# Patient Record
Sex: Male | Born: 1970 | Race: Black or African American | Hispanic: No | Marital: Married | Smoking: Former smoker
Health system: Southern US, Community
[De-identification: ages and names within clinical notes are randomized; demographics above are authoritative.]

## PROBLEM LIST (undated history)

## (undated) DIAGNOSIS — D649 Anemia, unspecified: Secondary | ICD-10-CM

## (undated) DIAGNOSIS — R059 Cough, unspecified: Secondary | ICD-10-CM

## (undated) DIAGNOSIS — E079 Disorder of thyroid, unspecified: Secondary | ICD-10-CM

## (undated) DIAGNOSIS — Z992 Dependence on renal dialysis: Secondary | ICD-10-CM

## (undated) DIAGNOSIS — I10 Essential (primary) hypertension: Secondary | ICD-10-CM

## (undated) DIAGNOSIS — M62838 Other muscle spasm: Secondary | ICD-10-CM

## (undated) DIAGNOSIS — R51 Headache: Secondary | ICD-10-CM

## (undated) DIAGNOSIS — N186 End stage renal disease: Secondary | ICD-10-CM

## (undated) DIAGNOSIS — F329 Major depressive disorder, single episode, unspecified: Secondary | ICD-10-CM

## (undated) DIAGNOSIS — F419 Anxiety disorder, unspecified: Secondary | ICD-10-CM

## (undated) DIAGNOSIS — F32A Depression, unspecified: Secondary | ICD-10-CM

## (undated) DIAGNOSIS — J189 Pneumonia, unspecified organism: Secondary | ICD-10-CM

## (undated) DIAGNOSIS — R05 Cough: Secondary | ICD-10-CM

## (undated) HISTORY — DX: Anemia, unspecified: D64.9

## (undated) HISTORY — PX: ARTERIOVENOUS GRAFT PLACEMENT: SUR1029

## (undated) HISTORY — DX: Disorder of thyroid, unspecified: E07.9

---

## 2002-12-05 ENCOUNTER — Emergency Department (HOSPITAL_COMMUNITY): Admission: EM | Admit: 2002-12-05 | Discharge: 2002-12-05 | Payer: Self-pay | Admitting: Emergency Medicine

## 2002-12-11 ENCOUNTER — Emergency Department (HOSPITAL_COMMUNITY): Admission: EM | Admit: 2002-12-11 | Discharge: 2002-12-11 | Payer: Self-pay | Admitting: Emergency Medicine

## 2003-02-24 ENCOUNTER — Encounter: Payer: Self-pay | Admitting: Specialist

## 2003-02-24 ENCOUNTER — Encounter: Admission: RE | Admit: 2003-02-24 | Discharge: 2003-02-24 | Payer: Self-pay | Admitting: Specialist

## 2003-03-12 ENCOUNTER — Encounter: Payer: Self-pay | Admitting: Internal Medicine

## 2003-03-12 ENCOUNTER — Ambulatory Visit (HOSPITAL_COMMUNITY): Admission: RE | Admit: 2003-03-12 | Discharge: 2003-03-12 | Payer: Self-pay | Admitting: Internal Medicine

## 2003-05-18 ENCOUNTER — Emergency Department (HOSPITAL_COMMUNITY): Admission: EM | Admit: 2003-05-18 | Discharge: 2003-05-19 | Payer: Self-pay | Admitting: *Deleted

## 2004-12-01 ENCOUNTER — Inpatient Hospital Stay (HOSPITAL_COMMUNITY): Admission: EM | Admit: 2004-12-01 | Discharge: 2004-12-04 | Payer: Self-pay | Admitting: Emergency Medicine

## 2004-12-15 ENCOUNTER — Ambulatory Visit: Payer: Self-pay | Admitting: Internal Medicine

## 2004-12-16 ENCOUNTER — Ambulatory Visit: Payer: Self-pay | Admitting: *Deleted

## 2005-10-20 ENCOUNTER — Emergency Department (HOSPITAL_COMMUNITY): Admission: EM | Admit: 2005-10-20 | Discharge: 2005-10-20 | Payer: Self-pay | Admitting: Emergency Medicine

## 2005-10-30 ENCOUNTER — Inpatient Hospital Stay (HOSPITAL_COMMUNITY): Admission: EM | Admit: 2005-10-30 | Discharge: 2005-11-03 | Payer: Self-pay | Admitting: Emergency Medicine

## 2005-12-19 ENCOUNTER — Ambulatory Visit: Payer: Self-pay | Admitting: Internal Medicine

## 2005-12-21 ENCOUNTER — Ambulatory Visit (HOSPITAL_COMMUNITY): Admission: RE | Admit: 2005-12-21 | Discharge: 2005-12-21 | Payer: Self-pay | Admitting: Vascular Surgery

## 2006-03-01 ENCOUNTER — Emergency Department (HOSPITAL_COMMUNITY): Admission: EM | Admit: 2006-03-01 | Discharge: 2006-03-01 | Payer: Self-pay | Admitting: Emergency Medicine

## 2006-04-09 ENCOUNTER — Ambulatory Visit: Payer: Self-pay | Admitting: Internal Medicine

## 2006-04-09 ENCOUNTER — Inpatient Hospital Stay (HOSPITAL_COMMUNITY): Admission: EM | Admit: 2006-04-09 | Discharge: 2006-04-11 | Payer: Self-pay | Admitting: Emergency Medicine

## 2006-04-10 ENCOUNTER — Encounter (INDEPENDENT_AMBULATORY_CARE_PROVIDER_SITE_OTHER): Payer: Self-pay | Admitting: Interventional Cardiology

## 2006-04-20 ENCOUNTER — Ambulatory Visit: Payer: Self-pay | Admitting: Internal Medicine

## 2006-04-21 ENCOUNTER — Ambulatory Visit: Payer: Self-pay | Admitting: Internal Medicine

## 2006-04-25 ENCOUNTER — Ambulatory Visit: Payer: Self-pay | Admitting: Internal Medicine

## 2006-05-24 ENCOUNTER — Ambulatory Visit: Payer: Self-pay | Admitting: Internal Medicine

## 2006-08-10 ENCOUNTER — Ambulatory Visit (HOSPITAL_COMMUNITY): Admission: RE | Admit: 2006-08-10 | Discharge: 2006-08-10 | Payer: Self-pay | Admitting: Vascular Surgery

## 2006-09-17 ENCOUNTER — Ambulatory Visit (HOSPITAL_COMMUNITY): Admission: RE | Admit: 2006-09-17 | Discharge: 2006-09-17 | Payer: Self-pay | Admitting: Diagnostic Radiology

## 2006-09-18 ENCOUNTER — Ambulatory Visit (HOSPITAL_COMMUNITY): Admission: RE | Admit: 2006-09-18 | Discharge: 2006-09-18 | Payer: Self-pay | Admitting: Nephrology

## 2006-09-19 ENCOUNTER — Ambulatory Visit (HOSPITAL_COMMUNITY): Admission: RE | Admit: 2006-09-19 | Discharge: 2006-09-19 | Payer: Self-pay | Admitting: Vascular Surgery

## 2006-12-01 ENCOUNTER — Emergency Department (HOSPITAL_COMMUNITY): Admission: EM | Admit: 2006-12-01 | Discharge: 2006-12-01 | Payer: Self-pay | Admitting: Emergency Medicine

## 2007-02-02 ENCOUNTER — Encounter: Admission: RE | Admit: 2007-02-02 | Discharge: 2007-02-02 | Payer: Self-pay | Admitting: Nephrology

## 2007-07-01 ENCOUNTER — Emergency Department (HOSPITAL_COMMUNITY): Admission: EM | Admit: 2007-07-01 | Discharge: 2007-07-01 | Payer: Self-pay | Admitting: Emergency Medicine

## 2007-09-16 ENCOUNTER — Emergency Department (HOSPITAL_COMMUNITY): Admission: EM | Admit: 2007-09-16 | Discharge: 2007-09-17 | Payer: Self-pay | Admitting: Emergency Medicine

## 2007-10-01 ENCOUNTER — Ambulatory Visit: Payer: Self-pay | Admitting: Vascular Surgery

## 2007-10-01 ENCOUNTER — Ambulatory Visit (HOSPITAL_COMMUNITY): Admission: RE | Admit: 2007-10-01 | Discharge: 2007-10-01 | Payer: Self-pay | Admitting: Vascular Surgery

## 2008-03-15 ENCOUNTER — Emergency Department (HOSPITAL_COMMUNITY): Admission: EM | Admit: 2008-03-15 | Discharge: 2008-03-15 | Payer: Self-pay | Admitting: Emergency Medicine

## 2008-11-30 ENCOUNTER — Emergency Department (HOSPITAL_COMMUNITY): Admission: EM | Admit: 2008-11-30 | Discharge: 2008-12-01 | Payer: Self-pay | Admitting: Emergency Medicine

## 2008-12-19 ENCOUNTER — Emergency Department (HOSPITAL_COMMUNITY): Admission: EM | Admit: 2008-12-19 | Discharge: 2008-12-20 | Payer: Self-pay | Admitting: Emergency Medicine

## 2008-12-21 ENCOUNTER — Emergency Department (HOSPITAL_COMMUNITY): Admission: EM | Admit: 2008-12-21 | Discharge: 2008-12-21 | Payer: Self-pay | Admitting: Emergency Medicine

## 2009-01-11 ENCOUNTER — Emergency Department (HOSPITAL_COMMUNITY): Admission: EM | Admit: 2009-01-11 | Discharge: 2009-01-11 | Payer: Self-pay | Admitting: Emergency Medicine

## 2009-01-16 ENCOUNTER — Ambulatory Visit (HOSPITAL_COMMUNITY): Admission: RE | Admit: 2009-01-16 | Discharge: 2009-01-16 | Payer: Self-pay | Admitting: *Deleted

## 2009-01-18 ENCOUNTER — Ambulatory Visit: Payer: Self-pay | Admitting: Vascular Surgery

## 2009-01-18 ENCOUNTER — Ambulatory Visit (HOSPITAL_COMMUNITY): Admission: EM | Admit: 2009-01-18 | Discharge: 2009-01-18 | Payer: Self-pay | Admitting: Emergency Medicine

## 2009-02-07 ENCOUNTER — Observation Stay (HOSPITAL_COMMUNITY): Admission: EM | Admit: 2009-02-07 | Discharge: 2009-02-08 | Payer: Self-pay | Admitting: Emergency Medicine

## 2009-07-31 ENCOUNTER — Inpatient Hospital Stay (HOSPITAL_COMMUNITY): Admission: EM | Admit: 2009-07-31 | Discharge: 2009-08-02 | Payer: Self-pay | Admitting: Emergency Medicine

## 2009-08-04 ENCOUNTER — Ambulatory Visit: Payer: Self-pay | Admitting: Vascular Surgery

## 2009-08-04 ENCOUNTER — Ambulatory Visit (HOSPITAL_COMMUNITY): Admission: RE | Admit: 2009-08-04 | Discharge: 2009-08-04 | Payer: Self-pay | Admitting: Vascular Surgery

## 2009-08-23 ENCOUNTER — Emergency Department (HOSPITAL_COMMUNITY): Admission: EM | Admit: 2009-08-23 | Discharge: 2009-08-24 | Payer: Self-pay | Admitting: Emergency Medicine

## 2009-09-02 ENCOUNTER — Ambulatory Visit: Payer: Self-pay | Admitting: Vascular Surgery

## 2009-09-09 ENCOUNTER — Ambulatory Visit (HOSPITAL_COMMUNITY): Admission: RE | Admit: 2009-09-09 | Discharge: 2009-09-09 | Payer: Self-pay | Admitting: Vascular Surgery

## 2009-09-11 ENCOUNTER — Ambulatory Visit (HOSPITAL_COMMUNITY): Admission: RE | Admit: 2009-09-11 | Discharge: 2009-09-11 | Payer: Self-pay | Admitting: Vascular Surgery

## 2009-10-12 ENCOUNTER — Ambulatory Visit (HOSPITAL_COMMUNITY): Admission: RE | Admit: 2009-10-12 | Discharge: 2009-10-12 | Payer: Self-pay | Admitting: Nephrology

## 2009-10-19 ENCOUNTER — Ambulatory Visit (HOSPITAL_COMMUNITY): Admission: RE | Admit: 2009-10-19 | Discharge: 2009-10-19 | Payer: Self-pay | Admitting: Vascular Surgery

## 2009-10-19 ENCOUNTER — Ambulatory Visit: Payer: Self-pay | Admitting: Vascular Surgery

## 2009-11-11 ENCOUNTER — Ambulatory Visit: Payer: Self-pay | Admitting: Vascular Surgery

## 2009-11-30 ENCOUNTER — Ambulatory Visit (HOSPITAL_COMMUNITY): Admission: RE | Admit: 2009-11-30 | Discharge: 2009-11-30 | Payer: Self-pay | Admitting: Nephrology

## 2010-02-11 ENCOUNTER — Ambulatory Visit (HOSPITAL_COMMUNITY): Admission: RE | Admit: 2010-02-11 | Discharge: 2010-02-11 | Payer: Self-pay | Admitting: Nephrology

## 2010-02-11 ENCOUNTER — Inpatient Hospital Stay (HOSPITAL_COMMUNITY): Admission: EM | Admit: 2010-02-11 | Discharge: 2010-02-13 | Payer: Self-pay | Admitting: Emergency Medicine

## 2010-02-14 ENCOUNTER — Inpatient Hospital Stay (HOSPITAL_COMMUNITY): Admission: EM | Admit: 2010-02-14 | Discharge: 2010-02-22 | Payer: Self-pay | Admitting: Nephrology

## 2010-02-18 ENCOUNTER — Encounter (INDEPENDENT_AMBULATORY_CARE_PROVIDER_SITE_OTHER): Payer: Self-pay | Admitting: Nephrology

## 2010-02-18 ENCOUNTER — Ambulatory Visit: Payer: Self-pay | Admitting: Vascular Surgery

## 2010-03-17 ENCOUNTER — Ambulatory Visit (HOSPITAL_COMMUNITY): Admission: RE | Admit: 2010-03-17 | Discharge: 2010-03-17 | Payer: Self-pay | Admitting: Nephrology

## 2010-04-02 ENCOUNTER — Ambulatory Visit (HOSPITAL_COMMUNITY): Admission: RE | Admit: 2010-04-02 | Discharge: 2010-04-02 | Payer: Self-pay | Admitting: Nephrology

## 2010-04-26 ENCOUNTER — Ambulatory Visit (HOSPITAL_COMMUNITY): Admission: RE | Admit: 2010-04-26 | Discharge: 2010-04-26 | Payer: Self-pay | Admitting: Nephrology

## 2010-05-05 ENCOUNTER — Encounter: Admission: RE | Admit: 2010-05-05 | Discharge: 2010-05-05 | Payer: Self-pay | Admitting: Nephrology

## 2010-05-06 ENCOUNTER — Encounter: Admission: RE | Admit: 2010-05-06 | Discharge: 2010-05-06 | Payer: Self-pay | Admitting: Nephrology

## 2010-06-23 ENCOUNTER — Ambulatory Visit: Payer: Self-pay | Admitting: Cardiology

## 2010-06-23 ENCOUNTER — Ambulatory Visit (HOSPITAL_COMMUNITY): Admission: RE | Admit: 2010-06-23 | Discharge: 2010-06-23 | Payer: Self-pay | Admitting: Nephrology

## 2010-06-25 ENCOUNTER — Encounter: Admission: RE | Admit: 2010-06-25 | Discharge: 2010-06-25 | Payer: Self-pay | Admitting: Nephrology

## 2010-08-06 ENCOUNTER — Ambulatory Visit (HOSPITAL_COMMUNITY): Admission: RE | Admit: 2010-08-06 | Discharge: 2010-08-06 | Payer: Self-pay | Admitting: Nephrology

## 2010-10-24 ENCOUNTER — Encounter: Payer: Self-pay | Admitting: Neurosurgery

## 2010-10-24 ENCOUNTER — Encounter: Payer: Self-pay | Admitting: Nephrology

## 2010-12-14 LAB — POTASSIUM: Potassium: 4.9 mEq/L (ref 3.5–5.1)

## 2010-12-19 LAB — POCT I-STAT 4, (NA,K, GLUC, HGB,HCT)
Potassium: 4.1 mEq/L (ref 3.5–5.1)
Sodium: 132 mEq/L — ABNORMAL LOW (ref 135–145)

## 2010-12-20 LAB — CBC
HCT: 25.8 % — ABNORMAL LOW (ref 39.0–52.0)
HCT: 28.6 % — ABNORMAL LOW (ref 39.0–52.0)
HCT: 29.8 % — ABNORMAL LOW (ref 39.0–52.0)
Hemoglobin: 10 g/dL — ABNORMAL LOW (ref 13.0–17.0)
Hemoglobin: 8.8 g/dL — ABNORMAL LOW (ref 13.0–17.0)
Hemoglobin: 9.8 g/dL — ABNORMAL LOW (ref 13.0–17.0)
MCHC: 33.5 g/dL (ref 30.0–36.0)
MCHC: 33.6 g/dL (ref 30.0–36.0)
MCHC: 33.6 g/dL (ref 30.0–36.0)
MCHC: 33.9 g/dL (ref 30.0–36.0)
MCHC: 34.1 g/dL (ref 30.0–36.0)
MCHC: 34.2 g/dL (ref 30.0–36.0)
MCHC: 34.2 g/dL (ref 30.0–36.0)
MCV: 90.9 fL (ref 78.0–100.0)
MCV: 91.2 fL (ref 78.0–100.0)
MCV: 91.2 fL (ref 78.0–100.0)
MCV: 91.4 fL (ref 78.0–100.0)
MCV: 91.8 fL (ref 78.0–100.0)
MCV: 91.9 fL (ref 78.0–100.0)
MCV: 92 fL (ref 78.0–100.0)
Platelets: 173 10*3/uL (ref 150–400)
Platelets: 174 10*3/uL (ref 150–400)
Platelets: 188 10*3/uL (ref 150–400)
Platelets: 194 K/uL (ref 150–400)
Platelets: 197 10*3/uL (ref 150–400)
Platelets: 212 10*3/uL (ref 150–400)
RBC: 2.87 MIL/uL — ABNORMAL LOW (ref 4.22–5.81)
RBC: 2.93 MIL/uL — ABNORMAL LOW (ref 4.22–5.81)
RBC: 3.11 MIL/uL — ABNORMAL LOW (ref 4.22–5.81)
RBC: 3.25 MIL/uL — ABNORMAL LOW (ref 4.22–5.81)
RDW: 14.2 % (ref 11.5–15.5)
RDW: 14.4 % (ref 11.5–15.5)
RDW: 14.9 % (ref 11.5–15.5)
RDW: 15.1 % (ref 11.5–15.5)
WBC: 5 10*3/uL (ref 4.0–10.5)
WBC: 8.2 10*3/uL (ref 4.0–10.5)
WBC: 9.4 10*3/uL (ref 4.0–10.5)
WBC: 9.4 K/uL (ref 4.0–10.5)

## 2010-12-20 LAB — RENAL FUNCTION PANEL
Albumin: 2.5 g/dL — ABNORMAL LOW (ref 3.5–5.2)
Albumin: 2.5 g/dL — ABNORMAL LOW (ref 3.5–5.2)
Albumin: 2.5 g/dL — ABNORMAL LOW (ref 3.5–5.2)
Albumin: 2.7 g/dL — ABNORMAL LOW (ref 3.5–5.2)
Albumin: 2.8 g/dL — ABNORMAL LOW (ref 3.5–5.2)
BUN: 34 mg/dL — ABNORMAL HIGH (ref 6–23)
BUN: 39 mg/dL — ABNORMAL HIGH (ref 6–23)
BUN: 69 mg/dL — ABNORMAL HIGH (ref 6–23)
BUN: 90 mg/dL — ABNORMAL HIGH (ref 6–23)
CO2: 28 mEq/L (ref 19–32)
CO2: 29 meq/L (ref 19–32)
Calcium: 10 mg/dL (ref 8.4–10.5)
Calcium: 9.5 mg/dL (ref 8.4–10.5)
Calcium: 9.8 mg/dL (ref 8.4–10.5)
Calcium: 9.9 mg/dL (ref 8.4–10.5)
Calcium: 9.9 mg/dL (ref 8.4–10.5)
Chloride: 94 mEq/L — ABNORMAL LOW (ref 96–112)
Chloride: 94 meq/L — ABNORMAL LOW (ref 96–112)
Creatinine, Ser: 11.39 mg/dL — ABNORMAL HIGH (ref 0.4–1.5)
Creatinine, Ser: 11.45 mg/dL — ABNORMAL HIGH (ref 0.4–1.5)
Creatinine, Ser: 14.72 mg/dL — ABNORMAL HIGH (ref 0.4–1.5)
Creatinine, Ser: 15.31 mg/dL — ABNORMAL HIGH (ref 0.4–1.5)
Creatinine, Ser: 16.06 mg/dL — ABNORMAL HIGH (ref 0.4–1.5)
GFR calc Af Amer: 4 mL/min — ABNORMAL LOW (ref >60)
GFR calc Af Amer: 5 mL/min — ABNORMAL LOW (ref >60)
GFR calc Af Amer: 6 mL/min — ABNORMAL LOW (ref >60)
GFR calc Af Amer: 6 mL/min — ABNORMAL LOW (ref >60)
GFR calc non Af Amer: 3 mL/min — ABNORMAL LOW (ref >60)
GFR calc non Af Amer: 4 mL/min — ABNORMAL LOW (ref >60)
GFR calc non Af Amer: 5 mL/min — ABNORMAL LOW (ref >60)
GFR calc non Af Amer: 5 mL/min — ABNORMAL LOW (ref >60)
Glucose, Bld: 101 mg/dL — ABNORMAL HIGH (ref 70–99)
Glucose, Bld: 104 mg/dL — ABNORMAL HIGH (ref 70–99)
Glucose, Bld: 91 mg/dL (ref 70–99)
Glucose, Bld: 92 mg/dL (ref 70–99)
Phosphorus: 4.8 mg/dL — ABNORMAL HIGH (ref 2.3–4.6)
Phosphorus: 5 mg/dL — ABNORMAL HIGH (ref 2.3–4.6)
Phosphorus: 5.3 mg/dL — ABNORMAL HIGH (ref 2.3–4.6)
Phosphorus: 6.1 mg/dL — ABNORMAL HIGH (ref 2.3–4.6)
Phosphorus: 6.5 mg/dL — ABNORMAL HIGH (ref 2.3–4.6)
Potassium: 3.8 mEq/L (ref 3.5–5.1)
Potassium: 4.6 mEq/L (ref 3.5–5.1)
Potassium: 5 meq/L (ref 3.5–5.1)
Sodium: 133 mEq/L — ABNORMAL LOW (ref 135–145)
Sodium: 133 meq/L — ABNORMAL LOW (ref 135–145)
Sodium: 135 mEq/L (ref 135–145)

## 2010-12-20 LAB — PTH, INTACT AND CALCIUM
Calcium, Total (PTH): 8.7 mg/dL (ref 8.4–10.5)
PTH: 174.7 pg/mL — ABNORMAL HIGH (ref 14.0–72.0)

## 2010-12-20 LAB — TYPE AND SCREEN: ABO/RH(D): O POS

## 2010-12-20 LAB — IRON AND TIBC
Iron: 45 ug/dL (ref 42–135)
TIBC: 165 ug/dL — ABNORMAL LOW (ref 215–435)

## 2010-12-20 LAB — VANCOMYCIN, RANDOM: Vancomycin Rm: 50.5 ug/mL

## 2010-12-20 LAB — BASIC METABOLIC PANEL
BUN: 50 mg/dL — ABNORMAL HIGH (ref 6–23)
Creatinine, Ser: 14.46 mg/dL — ABNORMAL HIGH (ref 0.4–1.5)
GFR calc non Af Amer: 4 mL/min — ABNORMAL LOW (ref >60)

## 2010-12-21 LAB — CBC
HCT: 31.2 % — ABNORMAL LOW (ref 39.0–52.0)
Hemoglobin: 10.6 g/dL — ABNORMAL LOW (ref 13.0–17.0)
Hemoglobin: 9.6 g/dL — ABNORMAL LOW (ref 13.0–17.0)
MCHC: 34.1 g/dL (ref 30.0–36.0)
MCHC: 34.1 g/dL (ref 30.0–36.0)
MCV: 90.6 fL (ref 78.0–100.0)
MCV: 92.3 fL (ref 78.0–100.0)
Platelets: 190 10*3/uL (ref 150–400)
Platelets: 197 10*3/uL (ref 150–400)
Platelets: 252 10*3/uL (ref 150–400)
RBC: 3.08 MIL/uL — ABNORMAL LOW (ref 4.22–5.81)
RBC: 3.75 MIL/uL — ABNORMAL LOW (ref 4.22–5.81)
RDW: 14.8 % (ref 11.5–15.5)
RDW: 14.9 % (ref 11.5–15.5)
WBC: 7.5 10*3/uL (ref 4.0–10.5)

## 2010-12-21 LAB — BASIC METABOLIC PANEL
BUN: 33 mg/dL — ABNORMAL HIGH (ref 6–23)
CO2: 24 mEq/L (ref 19–32)
Calcium: 10.1 mg/dL (ref 8.4–10.5)
Chloride: 98 mEq/L (ref 96–112)
Chloride: 99 mEq/L (ref 96–112)
Creatinine, Ser: 10.65 mg/dL — ABNORMAL HIGH (ref 0.4–1.5)
GFR calc Af Amer: 4 mL/min — ABNORMAL LOW (ref >60)
GFR calc Af Amer: 7 mL/min — ABNORMAL LOW (ref >60)
GFR calc non Af Amer: 5 mL/min — ABNORMAL LOW (ref >60)
Sodium: 135 mEq/L (ref 135–145)

## 2010-12-21 LAB — CULTURE, BLOOD (ROUTINE X 2): Culture: NO GROWTH

## 2010-12-21 LAB — RENAL FUNCTION PANEL
BUN: 47 mg/dL — ABNORMAL HIGH (ref 6–23)
CO2: 23 mEq/L (ref 19–32)
Calcium: 10.1 mg/dL (ref 8.4–10.5)
Calcium: 10.2 mg/dL (ref 8.4–10.5)
GFR calc Af Amer: 5 mL/min — ABNORMAL LOW (ref >60)
GFR calc non Af Amer: 4 mL/min — ABNORMAL LOW (ref >60)
Glucose, Bld: 109 mg/dL — ABNORMAL HIGH (ref 70–99)
Phosphorus: 4.7 mg/dL — ABNORMAL HIGH (ref 2.3–4.6)
Potassium: 4.6 mEq/L (ref 3.5–5.1)
Sodium: 137 mEq/L (ref 135–145)
Sodium: 137 mEq/L (ref 135–145)

## 2010-12-21 LAB — DIFFERENTIAL
Basophils Absolute: 0 10*3/uL (ref 0.0–0.1)
Eosinophils Absolute: 0 10*3/uL (ref 0.0–0.7)
Eosinophils Absolute: 0 10*3/uL (ref 0.0–0.7)
Eosinophils Relative: 0 % (ref 0–5)
Lymphocytes Relative: 5 % — ABNORMAL LOW (ref 12–46)
Lymphocytes Relative: 6 % — ABNORMAL LOW (ref 12–46)
Lymphs Abs: 0.5 10*3/uL — ABNORMAL LOW (ref 0.7–4.0)
Monocytes Absolute: 0.8 10*3/uL (ref 0.1–1.0)
Monocytes Relative: 1 % — ABNORMAL LOW (ref 3–12)
Neutro Abs: 7 10*3/uL (ref 1.7–7.7)
Neutrophils Relative %: 93 % — ABNORMAL HIGH (ref 43–77)

## 2010-12-21 LAB — GLUCOSE, CAPILLARY
Glucose-Capillary: 117 mg/dL — ABNORMAL HIGH (ref 70–99)
Glucose-Capillary: 76 mg/dL (ref 70–99)

## 2010-12-21 LAB — LACTIC ACID, PLASMA: Lactic Acid, Venous: 2.2 mmol/L (ref 0.5–2.2)

## 2011-01-05 LAB — POCT CARDIAC MARKERS
CKMB, poc: <1 ng/mL — ABNORMAL LOW (ref 1.0–8.0)
CKMB, poc: <1 ng/mL — ABNORMAL LOW (ref 1.0–8.0)
Myoglobin, poc: >500 ng/mL (ref 12–200)
Myoglobin, poc: >500 ng/mL (ref 12–200)

## 2011-01-05 LAB — CBC
MCHC: 33.9 g/dL (ref 30.0–36.0)
MCV: 94.5 fL (ref 78.0–100.0)
Platelets: 229 10*3/uL (ref 150–400)
RDW: 15.1 % (ref 11.5–15.5)
WBC: 4.5 10*3/uL (ref 4.0–10.5)

## 2011-01-05 LAB — DIFFERENTIAL
Basophils Absolute: 0 10*3/uL (ref 0.0–0.1)
Basophils Relative: 1 % (ref 0–1)
Eosinophils Absolute: 0.1 10*3/uL (ref 0.0–0.7)
Neutrophils Relative %: 52 % (ref 43–77)

## 2011-01-05 LAB — POCT I-STAT, CHEM 8
HCT: 39 % (ref 39.0–52.0)
Hemoglobin: 13.3 g/dL (ref 13.0–17.0)
Potassium: 4.8 mEq/L (ref 3.5–5.1)
Sodium: 134 mEq/L — ABNORMAL LOW (ref 135–145)
TCO2: 23 mmol/L (ref 0–100)

## 2011-01-05 LAB — POCT I-STAT 4, (NA,K, GLUC, HGB,HCT)
Potassium: 4 mEq/L (ref 3.5–5.1)
Sodium: 133 mEq/L — ABNORMAL LOW (ref 135–145)

## 2011-01-06 LAB — CBC
HCT: 40.2 % (ref 39.0–52.0)
HCT: 42 % (ref 39.0–52.0)
HCT: 43.6 % (ref 39.0–52.0)
Hemoglobin: 13.6 g/dL (ref 13.0–17.0)
Hemoglobin: 14.1 g/dL (ref 13.0–17.0)
Hemoglobin: 14.6 g/dL (ref 13.0–17.0)
MCHC: 33.7 g/dL (ref 30.0–36.0)
MCV: 95.3 fL (ref 78.0–100.0)
Platelets: 168 10*3/uL (ref 150–400)
Platelets: 177 10*3/uL (ref 150–400)
Platelets: 194 10*3/uL (ref 150–400)
RBC: 4.41 MIL/uL (ref 4.22–5.81)
RDW: 15.2 % (ref 11.5–15.5)
RDW: 15.4 % (ref 11.5–15.5)
WBC: 10.4 10*3/uL (ref 4.0–10.5)
WBC: 17.2 10*3/uL — ABNORMAL HIGH (ref 4.0–10.5)

## 2011-01-06 LAB — URINE CULTURE
Colony Count: NO GROWTH
Culture: NO GROWTH

## 2011-01-06 LAB — COMPREHENSIVE METABOLIC PANEL
ALT: <8 U/L (ref 0–53)
Albumin: 4 g/dL (ref 3.5–5.2)
Alkaline Phosphatase: 74 U/L (ref 39–117)
BUN: 39 mg/dL — ABNORMAL HIGH (ref 6–23)
Chloride: 95 mEq/L — ABNORMAL LOW (ref 96–112)
Potassium: 4.8 mEq/L (ref 3.5–5.1)
Sodium: 133 mEq/L — ABNORMAL LOW (ref 135–145)
Total Bilirubin: 0.6 mg/dL (ref 0.3–1.2)
Total Protein: 8.8 g/dL — ABNORMAL HIGH (ref 6.0–8.3)

## 2011-01-06 LAB — CULTURE, BLOOD (ROUTINE X 2)

## 2011-01-06 LAB — BASIC METABOLIC PANEL
BUN: 28 mg/dL — ABNORMAL HIGH (ref 6–23)
BUN: 50 mg/dL — ABNORMAL HIGH (ref 6–23)
CO2: 22 mEq/L (ref 19–32)
CO2: 26 mEq/L (ref 19–32)
Calcium: 8.9 mg/dL (ref 8.4–10.5)
Calcium: 9.8 mg/dL (ref 8.4–10.5)
Chloride: 92 mEq/L — ABNORMAL LOW (ref 96–112)
Chloride: 94 mEq/L — ABNORMAL LOW (ref 96–112)
Creatinine, Ser: 17.85 mg/dL — ABNORMAL HIGH (ref 0.4–1.5)
GFR calc Af Amer: 4 mL/min — ABNORMAL LOW (ref >60)
GFR calc Af Amer: 4 mL/min — ABNORMAL LOW (ref >60)
GFR calc non Af Amer: 3 mL/min — ABNORMAL LOW (ref >60)
Glucose, Bld: 101 mg/dL — ABNORMAL HIGH (ref 70–99)
Glucose, Bld: 93 mg/dL (ref 70–99)
Potassium: 4.5 mEq/L (ref 3.5–5.1)
Potassium: 5.9 mEq/L — ABNORMAL HIGH (ref 3.5–5.1)
Sodium: 130 mEq/L — ABNORMAL LOW (ref 135–145)
Sodium: 136 mEq/L (ref 135–145)

## 2011-01-06 LAB — URINE MICROSCOPIC-ADD ON

## 2011-01-06 LAB — URINALYSIS, ROUTINE W REFLEX MICROSCOPIC
Glucose, UA: NEGATIVE mg/dL
Ketones, ur: 15 mg/dL — AB
Nitrite: NEGATIVE
Specific Gravity, Urine: 1.018 (ref 1.005–1.030)
pH: 5.5 (ref 5.0–8.0)

## 2011-01-06 LAB — DIFFERENTIAL
Basophils Absolute: 0 10*3/uL (ref 0.0–0.1)
Basophils Relative: 0 % (ref 0–1)
Eosinophils Absolute: 0 10*3/uL (ref 0.0–0.7)
Eosinophils Relative: 0 % (ref 0–5)
Monocytes Absolute: 0.4 10*3/uL (ref 0.1–1.0)
Monocytes Relative: 4 % (ref 3–12)
Neutro Abs: 9.6 10*3/uL — ABNORMAL HIGH (ref 1.7–7.7)

## 2011-01-11 LAB — DIFFERENTIAL
Basophils Relative: 1 % (ref 0–1)
Eosinophils Absolute: 0.1 10*3/uL (ref 0.0–0.7)
Eosinophils Relative: 1 % (ref 0–5)
Monocytes Absolute: 0.6 10*3/uL (ref 0.1–1.0)
Monocytes Relative: 12 % (ref 3–12)

## 2011-01-11 LAB — POCT I-STAT, CHEM 8
BUN: 59 mg/dL — ABNORMAL HIGH (ref 6–23)
Chloride: 97 mEq/L (ref 96–112)
Creatinine, Ser: 9 mg/dL — ABNORMAL HIGH (ref 0.4–1.5)
Glucose, Bld: 90 mg/dL (ref 70–99)
HCT: 43 % (ref 39.0–52.0)
Potassium: 5.5 mEq/L — ABNORMAL HIGH (ref 3.5–5.1)

## 2011-01-11 LAB — CBC
Hemoglobin: 13.4 g/dL (ref 13.0–17.0)
MCHC: 33.8 g/dL (ref 30.0–36.0)
MCV: 95.5 fL (ref 78.0–100.0)
RBC: 4.14 MIL/uL — ABNORMAL LOW (ref 4.22–5.81)
RDW: 16 % — ABNORMAL HIGH (ref 11.5–15.5)

## 2011-01-11 LAB — CARDIAC PANEL(CRET KIN+CKTOT+MB+TROPI)
CK, MB: 1.1 ng/mL (ref 0.3–4.0)
Total CK: 209 U/L (ref 7–232)
Troponin I: 0.02 ng/mL (ref 0.00–0.06)

## 2011-01-11 LAB — TROPONIN I: Troponin I: 0.02 ng/mL (ref 0.00–0.06)

## 2011-01-11 LAB — CK TOTAL AND CKMB (NOT AT ARMC): Relative Index: 0.4 (ref 0.0–2.5)

## 2011-01-12 LAB — CBC
MCHC: 33 g/dL (ref 30.0–36.0)
MCV: 97.6 fL (ref 78.0–100.0)
Platelets: 180 10*3/uL (ref 150–400)
WBC: 7.6 10*3/uL (ref 4.0–10.5)

## 2011-01-12 LAB — POCT I-STAT, CHEM 8
Calcium, Ion: 1.26 mmol/L (ref 1.12–1.32)
Chloride: 107 mEq/L (ref 96–112)
Glucose, Bld: 80 mg/dL (ref 70–99)
HCT: 38 % — ABNORMAL LOW (ref 39.0–52.0)
HCT: 43 % (ref 39.0–52.0)
Hemoglobin: 14.6 g/dL (ref 13.0–17.0)
Potassium: 4 mEq/L (ref 3.5–5.1)
Sodium: 138 mEq/L (ref 135–145)
TCO2: 26 mmol/L (ref 0–100)

## 2011-01-12 LAB — BASIC METABOLIC PANEL
BUN: 30 mg/dL — ABNORMAL HIGH (ref 6–23)
Chloride: 94 mEq/L — ABNORMAL LOW (ref 96–112)
Creatinine, Ser: 9.57 mg/dL — ABNORMAL HIGH (ref 0.4–1.5)

## 2011-01-12 LAB — DIFFERENTIAL
Basophils Relative: 1 % (ref 0–1)
Eosinophils Absolute: 0 10*3/uL (ref 0.0–0.7)
Neutrophils Relative %: 80 % — ABNORMAL HIGH (ref 43–77)

## 2011-01-12 LAB — POCT CARDIAC MARKERS
CKMB, poc: <1 ng/mL — ABNORMAL LOW (ref 1.0–8.0)
CKMB, poc: <1 ng/mL — ABNORMAL LOW (ref 1.0–8.0)
Myoglobin, poc: 416 ng/mL (ref 12–200)
Myoglobin, poc: 440 ng/mL (ref 12–200)

## 2011-01-13 LAB — DIFFERENTIAL
Basophils Relative: 0 % (ref 0–1)
Lymphs Abs: 1 10*3/uL (ref 0.7–4.0)
Monocytes Relative: 8 % (ref 3–12)
Neutro Abs: 6.3 10*3/uL (ref 1.7–7.7)
Neutrophils Relative %: 78 % — ABNORMAL HIGH (ref 43–77)

## 2011-01-13 LAB — CULTURE, BLOOD (ROUTINE X 2)

## 2011-01-13 LAB — POCT I-STAT, CHEM 8
Calcium, Ion: 1.09 mmol/L — ABNORMAL LOW (ref 1.12–1.32)
Creatinine, Ser: 12.5 mg/dL — ABNORMAL HIGH (ref 0.4–1.5)
Glucose, Bld: 85 mg/dL (ref 70–99)
HCT: 39 % (ref 39.0–52.0)
Hemoglobin: 13.3 g/dL (ref 13.0–17.0)
Potassium: 4.8 mEq/L (ref 3.5–5.1)
TCO2: 28 mmol/L (ref 0–100)

## 2011-01-13 LAB — CBC
MCHC: 33.4 g/dL (ref 30.0–36.0)
RBC: 3.88 MIL/uL — ABNORMAL LOW (ref 4.22–5.81)
WBC: 8.1 10*3/uL (ref 4.0–10.5)

## 2011-01-24 ENCOUNTER — Other Ambulatory Visit (HOSPITAL_COMMUNITY): Payer: Self-pay | Admitting: Nephrology

## 2011-01-24 DIAGNOSIS — N186 End stage renal disease: Secondary | ICD-10-CM

## 2011-01-25 ENCOUNTER — Ambulatory Visit (HOSPITAL_COMMUNITY): Payer: Medicare Other

## 2011-01-25 ENCOUNTER — Ambulatory Visit (HOSPITAL_COMMUNITY)
Admission: RE | Admit: 2011-01-25 | Discharge: 2011-01-25 | Disposition: A | Discharge status: Home / Self Care | Payer: Medicare Other | Source: Ambulatory Visit | Attending: Nephrology | Admitting: Nephrology

## 2011-01-25 ENCOUNTER — Other Ambulatory Visit (HOSPITAL_COMMUNITY): Payer: Self-pay | Admitting: Nephrology

## 2011-01-25 DIAGNOSIS — Y832 Surgical operation with anastomosis, bypass or graft as the cause of abnormal reaction of the patient, or of later complication, without mention of misadventure at the time of the procedure: Secondary | ICD-10-CM | POA: Insufficient documentation

## 2011-01-25 DIAGNOSIS — T82898A Other specified complication of vascular prosthetic devices, implants and grafts, initial encounter: Secondary | ICD-10-CM | POA: Insufficient documentation

## 2011-01-25 DIAGNOSIS — I120 Hypertensive chronic kidney disease with stage 5 chronic kidney disease or end stage renal disease: Secondary | ICD-10-CM | POA: Insufficient documentation

## 2011-01-25 DIAGNOSIS — D509 Iron deficiency anemia, unspecified: Secondary | ICD-10-CM | POA: Insufficient documentation

## 2011-01-25 DIAGNOSIS — N186 End stage renal disease: Secondary | ICD-10-CM

## 2011-01-25 DIAGNOSIS — N2581 Secondary hyperparathyroidism of renal origin: Secondary | ICD-10-CM | POA: Insufficient documentation

## 2011-01-25 MED ORDER — IOHEXOL 300 MG/ML  SOLN
100.0000 mL | Freq: Once | INTRAMUSCULAR | Status: AC | PRN
  Administered 2011-01-25: 50 mL via INTRAVENOUS

## 2011-02-05 ENCOUNTER — Emergency Department (HOSPITAL_COMMUNITY): Payer: Medicare Other

## 2011-02-05 ENCOUNTER — Emergency Department (HOSPITAL_COMMUNITY)
Admission: EM | Admit: 2011-02-05 | Discharge: 2011-02-05 | Disposition: A | Discharge status: Home / Self Care | Payer: Medicare Other | Attending: Emergency Medicine | Admitting: Emergency Medicine

## 2011-02-05 DIAGNOSIS — Z79899 Other long term (current) drug therapy: Secondary | ICD-10-CM | POA: Insufficient documentation

## 2011-02-05 DIAGNOSIS — M25439 Effusion, unspecified wrist: Secondary | ICD-10-CM | POA: Insufficient documentation

## 2011-02-05 DIAGNOSIS — Z992 Dependence on renal dialysis: Secondary | ICD-10-CM | POA: Insufficient documentation

## 2011-02-05 DIAGNOSIS — I129 Hypertensive chronic kidney disease with stage 1 through stage 4 chronic kidney disease, or unspecified chronic kidney disease: Secondary | ICD-10-CM | POA: Insufficient documentation

## 2011-02-05 DIAGNOSIS — N189 Chronic kidney disease, unspecified: Secondary | ICD-10-CM | POA: Insufficient documentation

## 2011-02-05 DIAGNOSIS — M25539 Pain in unspecified wrist: Secondary | ICD-10-CM | POA: Insufficient documentation

## 2011-02-05 LAB — DIFFERENTIAL
Basophils Absolute: 0 10*3/uL (ref 0.0–0.1)
Basophils Relative: 1 % (ref 0–1)
Eosinophils Absolute: 0 10*3/uL (ref 0.0–0.7)
Eosinophils Relative: 1 % (ref 0–5)
Monocytes Absolute: 0.9 10*3/uL (ref 0.1–1.0)
Monocytes Relative: 12 % (ref 3–12)

## 2011-02-05 LAB — CBC
MCH: 31.4 pg (ref 26.0–34.0)
MCHC: 33.3 g/dL (ref 30.0–36.0)
RDW: 13.5 % (ref 11.5–15.5)

## 2011-02-05 LAB — BASIC METABOLIC PANEL
CO2: 27 mEq/L (ref 19–32)
Calcium: 10.3 mg/dL (ref 8.4–10.5)
Creatinine, Ser: 9.33 mg/dL — ABNORMAL HIGH (ref 0.4–1.5)
GFR calc Af Amer: 8 mL/min — ABNORMAL LOW (ref >60)
GFR calc non Af Amer: 6 mL/min — ABNORMAL LOW (ref >60)
Sodium: 136 mEq/L (ref 135–145)

## 2011-02-08 ENCOUNTER — Emergency Department (HOSPITAL_COMMUNITY): Payer: Medicare Other

## 2011-02-08 ENCOUNTER — Emergency Department (HOSPITAL_COMMUNITY)
Admission: EM | Admit: 2011-02-08 | Discharge: 2011-02-08 | Disposition: A | Discharge status: Home / Self Care | Payer: Medicare Other | Attending: Emergency Medicine | Admitting: Emergency Medicine

## 2011-02-08 DIAGNOSIS — M25439 Effusion, unspecified wrist: Secondary | ICD-10-CM | POA: Insufficient documentation

## 2011-02-08 DIAGNOSIS — I129 Hypertensive chronic kidney disease with stage 1 through stage 4 chronic kidney disease, or unspecified chronic kidney disease: Secondary | ICD-10-CM | POA: Insufficient documentation

## 2011-02-08 DIAGNOSIS — Z7901 Long term (current) use of anticoagulants: Secondary | ICD-10-CM | POA: Insufficient documentation

## 2011-02-08 DIAGNOSIS — M25539 Pain in unspecified wrist: Secondary | ICD-10-CM | POA: Insufficient documentation

## 2011-02-08 DIAGNOSIS — N189 Chronic kidney disease, unspecified: Secondary | ICD-10-CM | POA: Insufficient documentation

## 2011-02-08 DIAGNOSIS — D649 Anemia, unspecified: Secondary | ICD-10-CM | POA: Insufficient documentation

## 2011-02-08 DIAGNOSIS — Z992 Dependence on renal dialysis: Secondary | ICD-10-CM | POA: Insufficient documentation

## 2011-02-08 DIAGNOSIS — M25639 Stiffness of unspecified wrist, not elsewhere classified: Secondary | ICD-10-CM | POA: Insufficient documentation

## 2011-02-08 DIAGNOSIS — Z79899 Other long term (current) drug therapy: Secondary | ICD-10-CM | POA: Insufficient documentation

## 2011-02-15 NOTE — Assessment & Plan Note (Signed)
OFFICE VISIT   NIKALUS, LASKE  DOB:  23-Dec-1970                                       09/02/2009  [id number]   CHIEF COMPLAINT:  Needs new hemodialysis access.   HISTORY OF PRESENT ILLNESS:  The patient is a 40 year old male with end-  stage renal disease.  He has previously had a left forearm AV graft  which has occluded and has been revised multiple times.  He was recently  admitted to the hospital with a staph line sepsis.  He is right-handed.  He currently dialyzes on Tuesday, Thursday and Saturday.   Other chronic medical problems include hypertension, anemia of chronic  disease, in addition to his end-stage renal disease.  These are all  stable in character.  He also has a history of hepatitis B antigen.   PAST SURGICAL HISTORY:  Otherwise unremarkable except for his access  procedures.   He currently is dialyzing via right-sided catheter.  He states that ever  since catheter has been placed, he has had some swelling of his neck and  feels like he has some facial swelling as well.   Full review of systems was performed with the patient.  Please see  intake referral form for details regarding this.   FAMILY HISTORY:  No history of renal failure.   SOCIAL HISTORY:  He denies tobacco or alcohol abuse.   Records from his recent discharge summary from the hospital were  reviewed as well as his old medical records from our office regarding  all of his previous access procedures.  His most recent catheter was  placed on August 04, 2009.   PHYSICAL EXAMINATION:  Blood pressure is 121/86 in the right arm, heart  rate 73 and regular.  He has a right-sided dialysis catheter which has  no evidence of infection.  HEENT:  Unremarkable otherwise.  His neck  veins do not seem distended.  He does not seem to have JVD or cervical  lymphadenopathy.  He has no carotid bruits.  Chest:  Clear to  auscultation.  Cardiac: Exam has regular rate  without murmur.  Abdomen:  Soft, nontender, nondistended.  No masses.  Extremities:  He has 2+  brachial and radial pulses bilaterally.  He has no significant lower  extremity edema.  Skin has no rashes.  Neurologic:  Exam shows symmetric  upper extremity and lower extremity motor strength which is 5/5  bilaterally.  He has an occluded forearm graft on the left side.   I believe the best option for this patient would be placement of the  left upper arm AV graft at this point.  However, since he has had some  intermittent problems with facial swelling and neck swelling, I believe  he needs a central venogram prior to this.  We will schedule him for his  central venogram next week and then placement of his left upper arm  graft the following week if his central venogram shows the central veins  on the left side are patent.  Risks, benefits, possible complications  and  procedure details in placement of a left upper arm graft were  discussed with the patient today including, but not limited to,  bleeding, infection, graft thrombosis, ischemic steal.  He understands  and agrees to proceed.   Jessy Oto. Fields, MD  Electronically Signed   CEF/MEDQ  D:  09/03/2009  T:  09/03/2009  Job:  [id number]   cc:   Elzie Rings. Lorrene Reid, M.D.

## 2011-02-15 NOTE — Assessment & Plan Note (Signed)
OFFICE VISIT   [name], [name]  DOB:  [birth year]                                       11/11/2009  [id number]   CHIEF COMPLAINT:  Needs hemodialysis access.   HISTORY OF PRESENT ILLNESS:  The patient is a 40 year old male who has  had multiple failed prior hemodialysis access procedures.  He previously  had a left forearm AV graft in 2007.  He has had multiple thrombectomies  and revisions of this.  He has had multiple prior catheters.  He most  recently had a right Cimino fistula placed with Dr. Donnetta Hutching which occluded  early, in less than a month.   He has also had a central venogram which shows a left innominate vein  occlusion.  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  He currently dialyzes on Tuesday,  Thursday, Saturday.  He is right-handed.   CHRONIC MEDICAL PROBLEMS:  Include hypertension which is currently  controlled and followed by Dr. Lorrene Reid.   FAMILY HISTORY:  Unremarkable.   SOCIAL HISTORY:  He is unemployed.  He is married.  He has 2 children.  He does not smoke or consume alcohol regularly.   REVIEW OF SYSTEMS:  Full 12-point review of systems was performed with  the patient.  Please see intake referral form for details regarding  this.   PHYSICAL EXAM:  Blood pressure is 134/94 in the left arm, heart rate 76  and regular, respirations 16.  HEENT is unremarkable.  Neck has 2+  carotid pulses without bruit.  Upper extremities:  He has 2+ brachial  and radial pulses bilaterally.  He has thrombosed AV grafts in the left  arm.  He has a thrombosed artery to cephalic AV fistula.  Chest:  Clear  to auscultation.  Cardiac exam:  Regular rate abdomen rhythm.  Abdomen:  Soft, nontender, no masses.   I had a lengthy discussion with the patient today, a decision of placing  a thigh graft versus placing a right arm AV graft.  The fistula occluded  early most likely because  the vein was fairly small.  He does have a  patent superior vena cava at least by his most recent central venogram.  I discussed with him that he may develop venous hypertension or early  occlusion of the right arm AV graft due to his superior vena cava  stenosis.  I also discussed with him the possibility of placing a thigh  graft.  He is currently not interested in having a thigh graft and  wishes to try an arm graft first.  We will schedule this for him on a  nondialysis day.  Risks, benefits, possible complications, and procedure  details were discussed with the patient.  I also discussed with him  spending 1 night in the hospital to make sure he does not develop venous  hypertension symptoms or if he occluded early to consider redo  angioplasty of his superior vena cava.     Jessy Oto. Fields, MD  Electronically Signed   CEF/MEDQ  D:  11/16/2009  T:  11/16/2009  Job:  [id number]

## 2011-02-15 NOTE — Op Note (Signed)
[name], ALPERS NO.:  [id number]   MEDICAL RECORD NO.:  [id number]          PATIENT TYPE:  OBV   LOCATION:  2550                         FACILITY:  [facility]   PHYSICIAN:  [name], M.D.    DATE OF BIRTH:  05-01-1971   DATE OF PROCEDURE:  01/18/2009  DATE OF DISCHARGE:  01/18/2009                               OPERATIVE REPORT   PREOPERATIVE DIAGNOSIS:  End-stage renal disease.   POSTOPERATIVE DIAGNOSIS:  End-stage renal disease.   PROCEDURE:  Left internal jugular Diatek catheter placement with  ultrasound visualization.   SURGEON:  [name], MD   ASSISTANT:  Nurse.   ANESTHESIA:  MAC.   COMPLICATIONS:  None.   DISPOSITION:  To recovery room, stable.   PROCEDURE IN DETAIL:  The patient was taken to the operating room,  placed in supine position, where an area of the left and right neck  prepped and draped in the usual sterile fashion.  The patient was placed  in Trendelenburg position.  Using ultrasound guidance, the left internal  jugular vein was accessed and a guidewire was passed down to the level  of the superior vena cava.  The guidewire was not passed down into the  right atrium.  An internal catheter was positioned over this and with  manipulation of the catheter, the guidewire was passed down into the  right atrium.  A dilator and peel-away sheath were passed over the  guidewire, and the dilator and guidewire were removed.  A 32-cm Diatek  catheter was passed down through the peel-away sheath, which was then  removed.  The catheter was then brought to the subcutaneous tunnel.  The  2-lumen ports were attached.  Catheter was positioned at the level of  distal right atrium.  The both lumens were flushed and aspirated easily,  were locked with 1000 units/mL heparin.  The catheter was secured to the  skin with 3-0 nylon stitch and entry site was closed with 4-0  subcuticular Vicryl stitch.  Sterile dressing was applied.  The patient  was taken to the recovery room in stable condition.      [name], M.D.  Electronically Signed     TFE/MEDQ  D:  01/18/2009  T:  01/19/2009  Job:  [id number]

## 2011-02-15 NOTE — Procedures (Signed)
CEPHALIC VEIN MAPPING   INDICATION:  Renal failure.   HISTORY:  Diabetes mellitus.   EXAM:  The right cephalic vein is compressible.   Diameter measurements range from 0.10 to 0.31 cm.   The right basilic vein is compressible.   Diameter measurements range from 0.19 to 0.26 cm.   The left cephalic vein is compressible.   Diameter measurements range from 0.12 to 0.16 cm.   The left basilic vein is compressible with diameter measurements ranging  from 0.20 to 0.11 cm.   See attached worksheet for all measurements.   IMPRESSION:  The patient's bilateral basilic and cephalic veins are  questionable diameter for use as dialysis access graft site.   ___________________________________________  Jessy Oto. Fields, MD   CJ/MEDQ  D:  09/02/2009  T:  09/02/2009  Job:  [id number]

## 2011-02-15 NOTE — Op Note (Signed)
[name], [name]             ACCOUNT NO.:  [id number]   MEDICAL RECORD NO.:  [id number]          PATIENT TYPE:  AMB   LOCATION:  SDS                          FACILITY:  [facility]   PHYSICIAN:  Charles E. Fields, MD  DATE OF BIRTH:  1970/11/10   DATE OF PROCEDURE:  10/01/2007  DATE OF DISCHARGE:                               OPERATIVE REPORT   PROCEDURES:  1. Ultrasound of neck.  2. Insertion of right internal jugular vein Diatek catheter.   PREPROCEDURE DIAGNOSIS:  Endstage renal disease.   POSTOPERATIVE DIAGNOSIS:  Endstage renal disease.   ANESTHESIA:  Local with IV sedation.   ASSISTANT:  Nurse.   OPERATIVE FINDINGS:  1. Patent right internal jugular vein.  2. A 23-cm Diatek catheter, right internal jugular vein.   OPERATIVE DETAILS:  After obtaining informed consent, the patient was  taken to the operating room.  The patient was placed in the supine  position on the operating table.  After adequate sedation, the patient's  entire neck and chest were prepped and draped in the usual sterile  fashion.  Local anesthesia was then infiltrated over the area of the  right internal jugular vein.  Ultrasound was used to identify the right  internal jugular vein and assist in cannulation.  The right internal  jugular vein had normal compressibility and respiratory variation.  Next, an introducer was used to cannulate the right internal jugular  vein, and a 0.035 J-tip guidewire was threaded through the needle into  the right internal jugular vein and down to the inferior vena cava under  fluoroscopic guidance.  Next, sequential 12, 14 and 16 French dilators  with peel-away sheath were placed over the guidewire in the right  atrium.  The guidewire and dilator were removed.  A 23-cm Diatek  catheter was placed through the peel-away sheath in the right atrium.  The peel-away sheath was then removed.  The catheter was then tunneled  subcutaneously, cut to length and the hub attached.   The catheter was  noted to flush and draw easily.  The catheter was sutured to the skin  with nylon sutures.  The neck insertion site was closed with a Vicryl  stitch.  The catheter was inspected under fluoroscopy and found the tip  to be in the right atrium, no kinks throughout its course.  The catheter  was then loaded with concentrated heparin solution and dry sterile  dressing was applied.  The patient tolerated the procedure well and  there were no complications. Instruments, sponge, and needle counts were  correct at the end of the case.  The patient was taken to the recovery  room in stable condition.     Jessy Oto. Fields, MD  Electronically Signed    CEF/MEDQ  D:  10/01/2007  T:  10/01/2007  Job:  [id number]

## 2011-02-18 NOTE — Discharge Summary (Signed)
[name], [name]       ACCOUNT NO.:  [id number]   MEDICAL RECORD NO.:  [id number]          PATIENT TYPE:  INP   LOCATION:  2029                         FACILITY:  [facility]   PHYSICIAN:  Sheila Oats, M.D.DATE OF BIRTH:  1971-03-07   DATE OF ADMISSION:  10/30/2005  DATE OF DISCHARGE:  11/03/2005                                 DISCHARGE SUMMARY   DISCHARGE DIAGNOSES:  1.  Hypertensive emergency.  2.  Renal failure, advancing -- acute on chronic versus progressive,      secondary to hypertensive nephrosclerosis.  3.  Abdominal pain -- likely secondary to gastritis.  4.  Medication noncompliance -- in the past.   PROCEDURE:  /STUDIES  1.  CT scan of the abdomen -- significant gastric wall thickening involving      the fundus and proximal body.  Findings consistent with gastritis.  No      evidence of perforation or abscess.  No acute findings in the pelvis.  2.  Upper extremity vein mapping.   CONSULTATIONS:  1.  Nephrology -- Dr. Jonnie Finner.  2.  CVTS -- Dr. Oneida Alar.   HISTORY:  The patient is a 40 year old black male with known history of  hypertension and chronic renal insufficiency who presented to the ED two  times within one week with complaints of abdominal pain and elevated blood  pressure.  One week prior to this presentation the patient mainly was  complaining of abdominal pain with a sore throat.  Evaluation at that time  included the CT scan of the abdomen which showed possible colitis --  thickening involving the cecum and ascending colon.  The patient was treated  with prednisone and phenergan and discharged home.  The patient returned to  the ED with similar complaints and was found to have a blood pressure of  211/149.  Labs done in the ER revealed a creatinine of 10.5 and a urinalysis  with protein greater than 100 and LFTs within normal limits.  The patient  was admitted to the [facility] for further evaluation and  management.  The  patient denied headaches, nausea, vomiting, or blurred  vision and as far as his hypertension, he reported that he had been taking  his medications, although the admitting physician noted that the patient had  difficulty naming his medications and his Catapres patch was not on.  He  stated that occasionally he would have some abdominal pain which caused him  to be unable to eat.   PHYSICAL EXAMINATION UPON ADMISSION:  As per Dr. Delfina Redwood revealed a blood  pressure of 211/149 and the other pertinent findings on exam -- he was alert  and oriented x3 in no apparent distress, and the rest of his physical exam  was noted to be within normal limits.   LABORATORY DATA:  Sodium 136, potassium 3.6, chloride 104, BUN 97, glucose  93, creatinine 10.5, white cell count 6.8, hemoglobin 10.7, hematocrit 31.5,  MCV 87.6, platelet count 239, and his urine revealed a specific gravity  1.008, pH 6.5 negative for glucose, urine hemoglobin moderate, bilirubin  negative, rbc's 3-6, and his LFTs were within normal limits,  his lipase was  35.   HOSPITAL COURSE:  1.  Hypertensive emergency -- upon admission the patient was restarted on      his oral medications -- labetalol, Norvasc, and Catapres, and also IV      labetalol to keep systolic blood pressure less than 190.  His point of      care markers were negative.  His blood pressures responded well to the      antihypertensives.  His blood pressures and the past 48 hours have      ranged from 120/82 to 144/94.  He has remained chest pain free,      asymptomatic and hemodynamically stable, and will be discharged this      time to follow-up at [facility] in the morning.  2.  Advancing renal failure -- upon admission nephrology was consulted and      Dr. Jonnie Finner saw the patient.  He recommended monitoring the creatinine,      controlling the blood pressure, and avoiding ACE/ARB's and educating the      patient on dialysis.  His impression was that the renal  damage was      progressive and that the patient will require dialysis either acutely or      in the next few months.  CVTS was consulted for permanent access and Dr.      Oneida Alar saw the patient.  He had vein mapping done and the surgery was      scheduled for November 07, 2005, but the patient requested to go home and      just come for the procedure as an outpatient.  I discussed this with Dr.      Oneida Alar and he stated that it was okay to do so, as the patient was      already scheduled for the surgery and that he would be called regarding      the operating room time on the Sunday prior to his surgery which is on      Monday, November 07, 2005.  Nephrology stated that it was okay for the      patient to go home and he is to follow-up with [facility] -- Dr.      Jonnie Finner.  The patient was also started on Lasix 160 mg p.o. b.i.d. and      he will continue this and follow-up with [facility].  3.  Anemia -- secondary to advancing renal failure.  The patient was started      on Aranesp by nephrology and his H&H remained stable throughout his      hospital stay.  4.  Abdominal pain/gastritis -- the patient's abdominal pain resolved in the      hospital, he will be discharged on Prilosec 20 mg daily.   DISCHARGE MEDICATIONS:  1.  Norvasc 10 mg p.o. b.i.d.  2.  Catapres 0.3 mg patch q.7days.  3.  Lasix 160 mg p.o. b.i.d.  4.  Labetalol 600 mg p.o. b.i.d.   FOLLOWUP CARE:  1.  Dr. Oneida Alar on Monday, November 07, 2005 for permanent access surgery.  2.  [facility], Dr. Jonnie Finner in 1-2 weeks.  3.  [facility] in a.m.   DISCHARGE CONDITION:  Improved/stable.      Sheila Oats, M.D.  Electronically Signed     ACV/MEDQ  D:  11/03/2005  T:  11/03/2005  Job:  [id number]   cc:   Jessy Oto. [place], [place], [place]  [id number]   Sol Blazing, M.D.  Fax: [phone]

## 2011-02-18 NOTE — Op Note (Signed)
NAMEJAQUARIUS, NOVEY NO.:  [id number]   MEDICAL RECORD NO.:  [id number]          PATIENT TYPE:  AMB   LOCATION:  SDS                          FACILITY:  [facility]   PHYSICIAN:  [name], M.D.    DATE OF BIRTH:  [birth year]   DATE OF PROCEDURE:  09/19/2006  DATE OF DISCHARGE:  09/18/2006                               OPERATIVE REPORT   PREOPERATIVE DIAGNOSIS:  End-stage renal disease with occluded left arm  arteriovenous Gore-Tex graft.   POSTOPERATIVE DIAGNOSIS:  End-stage renal disease with occluded left arm  arteriovenous Gore-Tex graft.   PROCEDURE:  Thrombectomy, interposition jump graft revision to higher  basilic vein of left forearm loop arteriovenous Gore-Tex graft.   SURGEON:  [name], M.D.   ASSISTANT:  Suzzanne Cloud, PA-C.   ANESTHESIA:  MAC.   COMPLICATIONS:  None.   CONDITION TO RECOVERY:  Stable.   PROCEDURE IN DETAIL:  The patient was taken to the operating room,  placed in the supine position, the area of the left arm prepped and  draped in usual sterile fashion.  Incision was made over the antecubital  space to the prior graft and isolate the prior revision site from a  forearm loop graft.  The graft was thrombectomized.  The arterialized  plug was removed, and excellent flow was encountered.  __________  occluded.  Next, the basilic vein was exposed at the prior venous  anastomosis.  The vein was very sclerotic at this area.  There was  another duplicated branch of basilic vein more superficial, and this  vein was of good caliber and allowed a 5 dilator to pass without  resistance.  A new interposition graft, 7-mm Gore-Tex, was brought in  the field.  Was spatulated and sewn end-to-end to end to the basilic  vein with a running 6-0 Prolene suture.  This was then brought to  approximation of the old graft and was cut to appropriate length and  sewn end-to-end to the old graft with a running 6-0 Prolene suture.  Clamps were  removed, and good thrill was noted.  The wound was irrigated  with saline.  Hemostasis with cautery.  Wound was closed with 3-0 Vicryl  in the subcutaneous, subcuticular tissue.  Mini Steri-Strips were  applied.      [name], M.D.  Electronically Signed     TFE/MEDQ  D:  09/19/2006  T:  09/20/2006  Job:  [id number]

## 2011-02-18 NOTE — Op Note (Signed)
NAMEJADYNN, [name]                ACCOUNT NO.:  [id number]   MEDICAL RECORD NO.:  [id number]          PATIENT TYPE:  AMB   LOCATION:  SDS                          FACILITY:  [facility]   PHYSICIAN:  [name], M.D.    DATE OF BIRTH:  1971-02-11   DATE OF PROCEDURE:  08/10/2006  DATE OF DISCHARGE:  08/10/2006                                 OPERATIVE REPORT   PREOPERATIVE DIAGNOSIS:  End-stage renal disease, occluded left arm  arteriovenous graft.   POSTOPERATIVE DIAGNOSIS:  End-stage renal disease, occluded left arm  arteriovenous graft.   PROCEDURE:  Thrombectomy, interposition jump graft revision to basilic vein  of left arm arteriovenous Gore-Tex graft.   SURGEON:  [name], M.D.   ASSISTANT:  Nurse.   ANESTHESIA:  LMA.   COMPLICATIONS:  None.   DISPOSITION:  Recovery room, stable.   PROCEDURE DETAILS:  The patient was taken to the operating room and placed  in supine position, where the area of the left arm was prepped and draped in  the usual sterile fashion.  Incision was made over the antecubital space,  and carried down to isolate the arterial and venous anastomosis.  The  patient had the graft opened at this level near the venous anastomosis, and  there was a tight stenosis at the vein into the brachial vein.  The patient  had a large-caliber basilic vein that was visible from the surface.  The  decision was made to jump to the basilic vein.  A separate incision was made  over this and the vein was of good caliber.  A 7-mm Gore-Tex graft was  brought into the field, was spatulated and sewn end to side to the basilic  vein with a running 6-0 Prolene suture.  Clamps were removed.  The graft was  flushed with heparinized saline and reoccluded.  Next, this was tunneled to  the level of the old venous graft.  The graft itself was thrombectomized.  The arterialized plug was removed and good inflow was encountered.  This was  flushed with heparinized saline and  reoccluded.  The new interposition was  sewn to the old graft with a running 6-0 Prolene suture.  Clamps were  removed and an excellent thrill was noted.  The wound was irrigated with  saline.  Hemostasis was obtained with electrocautery.  The wound was closed  with 3-0 Vicryl in the subcutaneous, subcuticular tissue.  Benzoin and Steri-  Strips were applied.      [name], M.D.  Electronically Signed     TFE/MEDQ  D:  08/10/2006  T:  08/11/2006  Job:  [id number]

## 2011-02-18 NOTE — Discharge Summary (Signed)
NAMEKELVIN, LUNDE NO.:  [id number]   MEDICAL RECORD NO.:  [id number]          PATIENT TYPE:  INP   LOCATION:  4734                         FACILITY:  [facility]   PHYSICIAN:  Ashby Dawes. Polite, M.D. DATE OF BIRTH:  [birth year]   DATE OF ADMISSION:  11/30/2004  DATE OF DISCHARGE:                                 DISCHARGE SUMMARY   DISCHARGE DIAGNOSES:  1.  Acute renal failure on chronic renal insufficiency.  Discharge      creatinine 4.3.  Please note, the patient was seen by nephrologist, and      will be followed up on an outpatient basis for diagnosis of hypertensive      nephropathy.  2.  Hypertensive urgency secondary to noncompliance.  3.  Nausea and vomiting resolved, probably on the basis of uremia, plus or      minus gastritis.  4.  Abdominal pain, resolved.  5.  Medical noncompliance.   DISCHARGE MEDICATIONS:  1.  Labetalol 600 mg b.i.d.  2.  Norvasc 10 mg daily.  3.  Catapres TTS, 0.3 mg q week.   DISPOSITION:  The patient is being discharged to home in stable condition.  BP is still elevated and needs to be titrated on an outpatient basis.  Importance has been stressed with the patient to follow up with [facility]  and to follow up with nephrologist appointment.   CONSULTANTS:  Nephrology.   STUDIES:  The patient has had MRA of the renal arteries with no stenosis.  Renal ultrasound:  Diffusely increased renal parenchymal echogenicity  consistent with medical renal disease.  Both kidneys show significant  parenchymal thinning but are somewhat small in size, with the right kidney  measuring 8.8 cm, and the left kidney measuring 8.2 cm.  Chest x-ray:  No  acute disease.  A 24-hour urine for protein and creatinine pending at the  time of this dictation.  SPEP: Albumin 52.4, alpha-1 4.8, alpha-2 10.6, beta  serum 5.3, beta-2 5.4, gamma globulin 21.5.   INTERPRETATION:  Nonspecific, diffuse polyclonal type, increasing gamma  globulin.  Urine drug  screen negative.  UA significant for 100 protein,  hemoglobin A1c of 5.5.  BMET at discharge significant for creatinine of 4.3,  noted no acidosis.  BUN 30.   On admission, BUN 56, creatinine 4.8, TSH 0.706.   HISTORY OF PRESENT ILLNESS:  A 40 year old male with a known history of  hypertension, who presented to the ED for evaluation secondary to nausea and  vomiting over the past week and inability to keep p.o. down in the ED.  The  patient was noted to have BP within the range of 192/138 with labs showing  acute renal failure.  Admission was deemed necessary for further evaluation  and treatment.  Please see dictated H&P for further details.   PAST MEDICAL HISTORY:  Significant for hypertension and chronic renal  insufficiency with a creatinine within the range of 2.2 as far back as 2004.   MEDICATIONS:  The patient states he has been on his meds, but does not  remember the names.   ALLERGIES:  No known  drug allergies.   SOCIAL HISTORY:  Negative for tobacco, alcohol or drugs.   FAMILY HISTORY:  Mother with hypertension.   REVIEW OF SYSTEMS:  As per admission H&P.   HOSPITAL COURSE:  Problem 1. Acute renal failure, chronic renal  insufficiency.  The patient was admitted to a medicine floor bed for  evaluation and treatment.  The patient was started on IV fluids, and had  multiple labs ordered to evaluate the cause for possible renal failure.  The  patient was also seen by a nephrologist.  It was ultimately determined that  the patient's renal failure is secondary to hypertensive nephropathy.  The  patient had an MRA of the renal arteries without stenosis.  The patient had  an SPEP which showed nonspecific diffuse polyclonal type increase in the  gamma region.  The patient's 24-urine, protein and creatinine is pending at  the time of this dictation.  As stated, the patient was seen in consultation  by nephrology, and it is felt the patient's acute renal failure is secondary  to  hypertensive nephropathy.  The patient's BUN has improved down to the 31.  The patient's is without any signs of nausea or vomiting.  The patient has  been cleared for nephrology for discharge, and will have an outpatient  followup.  The patient will most likely need to be established with a graft  for possible dialysis, and if compliance is demonstrated, will probably be  put on list for renal transplant.   Problem 2. Hypertension.  As stated, the patient was admitted to the  hospital with BP of 192/138.  The patient was initially started on  nitroglycerin drip which was switched to labetalol.  The patient was quickly  changed over to oral meds, labetalol, as well as clonidine.  BPs were fair  control.  After titration of those medicines, BP 154/90, 143/95.  Today, the  patient's BP is 165/103; however, I do not believe the patient has had his  a.m. medications.  The patient's medications will be continued to be  titrated.  The patient is without headaches, without signs of end-organ  damage at this time other than hypertensive nephropathy.   The patient will be discharged on labetalol 600 mg b.i.d., Norvasc 10 mg  daily, as well as a Catapres patch.  The goal is hopefully to streamline the  patient's meds to one or two medicines if possible, as compliance has been  an issue in the past, and is probably on the basis of financial concerns.  The patient has been at [facility] before, and probably has just stopped  going.  The patient has been given advice that he needs to reestablish with  [facility] and also follow up with nephrologist who most likely will be  able to help out with some samples.  The patient expresses understanding of  above recommendations.   At this time, the patient is stable for discharge.  Again, the patient is  advised to follow up with [facility] and follow with nephrology.  The patient has been explicitly explained to avoid all NSAIDs, and it has been   reiterated to him by me, the nursing staff and the nephrologist, the  importance of compliance with this medicine in order to prevent further  renal dysfunction or stroke from  significantly elevated blood pressures.      RDP/MEDQ  D:  12/04/2004  T:  12/05/2004  Job:  [id number]

## 2011-02-18 NOTE — H&P (Signed)
NAMEHEATHE, JAKOBSEN NO.:  [id number]   MEDICAL RECORD NO.:  [id number]          PATIENT TYPE:  INP   LOCATION:  1843                         FACILITY:  [facility]   PHYSICIAN:  Corinna L. Conley Canal, MDDATE OF BIRTH:  1971/08/30   DATE OF ADMISSION:  12/01/2004  DATE OF DISCHARGE:                                HISTORY & PHYSICAL   CHIEF COMPLAINT:  Vomiting.   HISTORY OF PRESENT ILLNESS:  Mr. Topel is a 40 year old black male  unassigned who has not seen a doctor at [facility] for over a year.  He has  a history of hypertension, but has not taken any medications for many  months.  He presents to the emergency room with vomiting for the past week,  worse today.  He has had no diarrhea, no abdominal pain.  He has had  subjective fevers today.  He has had no sore throat, no headache, no visual  changes, no shortness of breath.  He does have some pain when he takes a  deep breath.  He was noted to have extremely high blood pressures in the  emergency room and had a creatinine of almost 5 so the hospitalist was  consulted for admission.  The patient reports no previous renal problems but  he does have a renal ultrasound in the computer from 2004 so I suspect he  has chronic renal insufficiency.  The ultrasound at that time showed medical  renal disease.  He has had no hematuria, no dysuria.   PAST MEDICAL HISTORY:  Hypertension.   MEDICATIONS:  None.  He has been on two antihypertensives, the names of  which he cannot recall.   ALLERGIES:  No known drug allergies.   SOCIAL HISTORY:  He does not drink, smoke, or use drugs.  He is here with  his wife and has a baby daughter.  He works at [employer].  He is from  [place].   FAMILY HISTORY:  His mother has hypertension.   REVIEW OF SYSTEMS:  As above, otherwise negative.   PHYSICAL EXAMINATION:  VITAL SIGNS:  Temperature 98.9, blood pressure  initially was 192/138.  After nitroglycerin drip was started and  he was  given 0.2 mg of clonidine his blood pressure is 190/105, pulse 82,  respiratory rate 20, oxygen saturation 100% on room air.  GENERAL:  Patient is a well-nourished, well-developed black male in no acute  distress.  HEENT:  Normocephalic, atraumatic.  Pupils are equal, round, and reactive to  light.  Sclerae nonicteric.  Moist mucous membranes.  Funduscopic  examination is without papilledema or hemorrhage.  NECK:  Supple.  No carotid bruits.  No thyromegaly.  LUNGS:  Clear to auscultation bilaterally without wheezes, rhonchi, or  rales.  CARDIOVASCULAR:  Regular rate and rhythm without murmurs, rubs, or gallops.  ABDOMEN:  Normal bowel sounds, soft, nontender, nondistended.  No bruit.  GENITOURINARY:  Deferred.  RECTAL:  Deferred.  EXTREMITIES:  No clubbing, cyanosis, edema.  SKIN:  No rash.  NEUROLOGIC:  Alert and oriented.  Cranial nerves and sensory motor  examination are intact.  PSYCHIATRIC:  Normal affect.  LABORATORIES:  CBC is unremarkable.  Sodium 138, potassium 2.9, chloride  102, bicarbonate 25, glucose 107, BUN 56, creatinine 4.8, lipase 26.  UA  reveals moderate blood, greater than 300 protein, 0-2 white cells, 0-2 red  cells, rare bacteria, mucus, negative nitrites, negative leukocyte esterase.  Chest x-ray is negative.   ASSESSMENT/PLAN:  1.  Vomiting most likely secondary to viral gastroenteritis.  Patient feels      better after getting Reglan.  I will give supportive care.  2.  Hypertensive urgency.  Patient has been given 0.2 mg of clonidine and      started on a nitroglycerin drip.  I will send him to the stepdown unit      for titration of the drip.  I will also start him on metoprolol and      Norvasc in the morning.  These will likely need to be increased.  He      will be on a low salt diet.  3.  Renal insufficiency, most likely chronic, possibly with an acute      prerenal component.  It will be important to get laboratories from      [facility]  to see what his creatinine has been in the past.  I suspect      that this is hypertensive kidney disease but I will check a fractional      excretion of sodium.  I will check a renal ultrasound to rule out      hydronephrosis.  I will check ANA, serum protein electrophoresis, urine      protein electrophoresis.  4.  Proteinuria.  I will check a 24-hour urine for protein and creatinine.  5.  Hypokalemia.  This will be repleted.  This may be due to all of his      vomiting.  He reports that he has had muscle spasms and cramps certainly      related to this.  6.  Noncompliance.  It will be important that he have follow-up with      [facility] upon discharge.  He also may need referral to nephrology.      CLS/MEDQ  D:  12/01/2004  T:  12/01/2004  Job:  [id number]

## 2011-02-18 NOTE — Discharge Summary (Signed)
NAMESHAMARR, [name] NO.:  [id number]   MEDICAL RECORD NO.:  [id number]          PATIENT TYPE:  INP   LOCATION:  2014                         FACILITY:  [facility]   PHYSICIAN:  Evette Doffing, M.D.  DATE OF BIRTH:  06/27/1971   DATE OF ADMISSION:  04/09/2006  DATE OF DISCHARGE:  04/11/2006                                 DISCHARGE SUMMARY   CONSULTANT:  Sherril Croon, M.D., nephrology.   DISCHARGE DIAGNOSES:  1. Hypertensive emergency with acute pulmonary edema  2. Advanced renal failure.  3. Chronic anemia.   DISCHARGE MEDICATIONS:  Norvasc 10mg  1 tablet p.o. q. Daily, labetalol 300mg   1 tablet p.o. b.i.d., Catapres .1mg  1 tablet p.o. b.i.d., minoxidil 2.5mg  1  tablet p.o. b.i.d., Lasix 120mg  3 tablets p.o. b.i.d., Aranesp 100 milli  centigrams under skin injection once a week, Nephro-Vite 1 tablet p.o. q.  Daily, Tums 2 tablets p.o. t.i.d.   CONDITION ON DISCHARGE:  Improved.  He is  to follow up with Dr.Vollmer at  [facility] to check his blood pressure under his new antihypertensive  regimen.  Also, Dr. Tomma Lightning is to check a BMET, because the patient is on  Lasix and he needs to have his potassium checked, and then adjust the  medication accordingly.  Also, the patient needs to receive Aranesp for  anemia and he needs to be instructed about how to receive this injection.  The patient has an appointment with Dr. Justin Mend at the [facility]  on 21st of August, 2007 and he is to schedule an appointment with Dr.  Tomma Lightning at [facility] in a week after discharge.   PROCEDURES:  At admission,  a chest x-ray showed cardiomegaly and pulmonary  vascular congestion, pulmonary edema, and/or diffused peribronchial  infiltrates.  A chest x-ray performed on the 9th of July showed the second  day showed improved pulmonary edema.  A 2-D echo showed left ventricular  size at the upper limits of normal.  Overall, left ventricular systolic  function normal.  Left  ventricular ejection fraction 55%.  No diagnostic  evidence of left ventricular regional motion abnormalities.  Left  ventricular wall thickness mildly increased.  There was a mild aortic  valvular regurgitation.  Left atrium was mildly dilated.  Estimated peak  pulmonary arteries systolic pressure mildly increased.  The 2-D echo was  performed on the 9th of July.   CONSULTATIONS:  Sherril Croon, M.D., nephrology.   The patient presented with a chief complaint of shortness of breath/dry  cough.  For full information about the patient, please refer to the  patient's chart.  But, briefly, [name] is a 40 year old African American  man with past medical history of renal disease since 2006 and hypertension  diagnosis in 2005.  On blood pressure lowering medication at home, non  compliant because of dizziness.  Presenting for shortness of breath/dry  cough for 5 days, more pronounced at night, not relieved by anything.   ALLERGIES:  NOT KNOWN.  DRUG ALLERGIES.   PAST MEDICAL HISTORY:  Hypertension diagnosed in 2005, colitis/gastritis in  January 2007.  SUBSTANCE HISTORY:  He never smoked.  He does not drink.  He does not take  illicit drugs.  He is married.  Works at [employer], where he  stands a lot.  He has no insurance right now, but he is in the process of  determining eligibility with [facility].  He lives with his wife.  His  mother had hypertension.  He has one child that is well.   REVIEW OF SYSTEMS:  Positive for weight loss, palpitations, dyspnea,  proximal nocturnal dyspnea, and depression.  No nausea, no chest pain, no  fever.   On admission, vital signs:  Temperature 97.2, blood pressure 203/125, pulse  96, respiratory rate between 18-29, oxygen saturation 97% on room air.  General exam:  The patient is quiet, but in no acute distress.  Head is  atraumatic, normocephalic.  Eyes: PERRLA.  Extraocular muscles intact.  ENT:  Clear, oropharynx.  Neck:  No  JVD.  No lymphadenopathy.  No thyromegaly.  Respiratory:  Clear to auscultation bilaterally.  Cardiovascular:  Regular  rhythm and rate.  Gastrointestinal:  Abdomen is supple, nontender,  nondistended.  Bowel sounds positive.  Extremities:  No edema. GU exam was  deferred.  The skin was moist.  No lymphadenopathy.  Mental status:  Normal,  alert and oriented x 3.  Neuro:  CNS II-XII intact.  Motor strength:  5/5 in  all 4.  DTR 2+ symmetric, sensation intact to sharp, dull, light touch.  Cerebral function intact by finger to nose.  Psych:  The patient admits as  being depressed.  Speaks barely audibly.   LABS ON ADMISSION:  Sodium 137, potassium 3.4, chloride 105, bicarbonate 18,  BUN 94, creatine 13.9, glucose 90.  Hemoglobin 8.8 and hematocrit 26.  A BMP  was 936 and cardiac enzymes at the point of care were myoglobin more than  500.  CK MB 3, troponin I less than 0.05.  A next set obtained 10 hours  later was: CK total 723, CK MB 5.5, troponin .08.  White blood cells 6.2,  thrombocytes 219.   PLAN:  1. Acute pulmonary edema most probably as a component of hypertension      emergency episode, secodary to advanced renal disease. Patient is a      known renal disease patient with an implanted dialysis device, but not      on dialysis.  He is presenting now with acute pulmonary edema that      manifested itself with shortness of breath, dry cough, proximal      nocturnal dyspnea.  The creatine was 13.9 on admission and his blood      pressure was 203/125.  The patient was able to diurese and we monitored      strict I's and O's.  We checked the chest x-ray that was positive for      pulmonary edema.  The patient was placed on Lasix 80mg  p.o. q. 8 hours      and potassium chloride 40 miliequivelents p.o. q. 4 hours 2 times.  We      started his home blood pressure medication and  added amlodipine 10mg      p.o. daily, clonidine .1mg  p.o. b.i.d., labetalol 600mg  p.o. b.i.d.      His  creatine decreased the second day from 13.9 to 13.1, however it was      maintained at about 13 for the whole hospital stay.  Due to the fact      that his blood  pressure was not completely controlled, on this      medication, we have called a nephrology consult with Dr. Justin Mend that      suggested to start the patient on minoxidil 5mg  b.i.d., to check a 2-D      echo.  For his anemia, Dr. Justin Mend suggested to start on Aranesp.  For his      blood pressure, increased Lasix dose. We changed the Lasix dose from      80mg  q. 8 hours to p.o. 120mg  b.i.d., started Calcium, Tums, and Nephro-      Vite p.o. q. daily.  A day prior to admission, we decreased the      minoxidil to 2.5mg  as his blood pressure became better controlled.      During hospitalization, the patient had no shortness of breath and a      chest x-ray showed improved pulmonary edema.  On discharge, the blood      pressure was 117/67.  2. hypertension, please see above.  3. lack of insurance.  Fraser Din. is supposed to schedule an appointment on the      17th with [facility] to talk about insurance eligibility.   DISCHARGE LABS:  Temperature 98, systolic blood pressure 0000000, diastolic  pulse 74, respiration 18.  I&O-1,130.  Oxygen saturation 99 on room air.  Leukocytes 4.8, hemoglobin 8.1, hematocrit 24.4, thrombocytes 195, sodium  141, potassium 3.7, chloride 110, bicarbonate 21, BUN 96, creatine 12.7,  glucose 86, calcium 7.2.  No pending labs.      Philemon Kingdom, M.D.  Electronically Signed      Evette Doffing, M.D.  Electronically Signed    CG/MEDQ  D:  04/11/2006  T:  04/12/2006  Job:  [id number]   cc:   Claiborne Billings L. Tomma Lightning, M.D.  Sherril Croon, M.D.  Beatrice Lecher, M.D.

## 2011-02-18 NOTE — Op Note (Signed)
[name], [name]       ACCOUNT NO.:  [id number]   MEDICAL RECORD NO.:  [id number]          PATIENT TYPE:  AMB   LOCATION:  SDS                          FACILITY:  [facility]   PHYSICIAN:  Charles E. Fields, MD  DATE OF BIRTH:  1971-03-18   DATE OF PROCEDURE:  12/21/2005  DATE OF DISCHARGE:  12/21/2005                                 OPERATIVE REPORT   PROCEDURE:  Left forearm AV graft.   PREOPERATIVE DIAGNOSIS:  End-stage renal disease.   POSTOPERATIVE DIAGNOSIS:  End-stage renal disease.   ANESTHESIA:  General.   ASSISTANT:  John Giovanni, P.A.-C.   OPERATIVE FINDINGS:  1.  A 3-mm basilic vein.  2.  A 6-mm PTFE.   OPERATIVE DETAIL:  After obtaining informed consent, the patient was taken  to the operating room. The patient was placed in the supine position on the  operating table. General anesthetic was commenced and a laryngeal mask was  placed. Next, the patient's entire left upper extremity was prepped and  draped in the usual sterile fashion. Local anesthesia was infiltrated  supplementally in the antecubital crease. A transverse incision was made in  this location, carried down through the subcutaneous tissues down to the  level of the cephalic vein. This was atrophic and chronically occluded.  Dissection was carried to the medial portion the forearm, and the brachial  artery was dissected free circumferentially and controlled proximally and  distally with vessel loops. Small side branches were ligated and divided  between silk ties. The adjacent deep brachial vein and basilic venous system  were dissected free circumferentially. There was some spasm,but the vein was  approximately 3 mm in diameter. There was several side branches which were  dissected free circumferentially and controlled.   Next, subcutaneous tunnel was created in a loop configuration down the  forearm, with a distal transverse incision in the forearm for assistance of  tunneling. A 6-mm PTFE  graft was then brought through the subcutaneous  tunnel. The patient was then given 5000 units of intravenous heparin. The  graft was slightly beveled on the arterial end, which was on the radial  aspect of the forearm.  A longitudinal arteriotomy was made. The graft was  then sewn end of graft-to-side of artery using a running 6-0 Prolene suture.  Just prior to completion of the anastomosis, it was forebled, backbled and  thoroughly flushed. The anastomosis was secured, the graft was clamped at  the apex of the loop, and the vessel loops were released.   Next, attention was turned to the venous aspect of the limb, which was on  the ulnar aspect of the arm. The vein was controlled proximally and distally  with fine bulldog clamps. A longitudinal venotomy was made. The graft was  pulled to length and beveled. Graft was then sewn end graft to side of vein  using a running 6-0 Prolene suture. The graft was sewn with its toe right at  a venous bifurcation. Just prior to completion of the anastomosis, it was  forebled, backbled and thoroughly flushed. The anastomosis was secured.  Clamps were released; there was palpable thrill above the  graft immediately.   Hemostasis was then obtained with thrombin and Gelfoam, as well as 50 mg of  protamine. Subcutaneous tissues were then reapproximated using running 3-0  Vicryl suture. The skin and both incisions were then closed with a 4-0  Vicryl subcuticular stitch. The patient had a palpable radial pulse at the  end of the case.   Dry sterile dressings were applied. The patient was taken to the recovery  room in stable condition.  Instrument, sponge and needle count was correct  at the end of the case.           ______________________________  Jessy Oto. Fields, MD     CEF/MEDQ  D:  12/21/2005  T:  12/22/2005  Job:  [id number]

## 2011-02-18 NOTE — H&P (Signed)
NAMEESLI, LANMAN       ACCOUNT NO.:  [id number]   MEDICAL RECORD NO.:  [id number]          PATIENT TYPE:  INP   LOCATION:  1824                         FACILITY:  [facility]   PHYSICIAN:  Ashby Dawes. Polite, M.D. DATE OF BIRTH:  12-31-1970   DATE OF ADMISSION:  10/30/2005  DATE OF DISCHARGE:                                HISTORY & PHYSICAL   CHIEF COMPLAINT:  Abdominal pain.   HISTORY OF PRESENT ILLNESS:  40 year old male with known history of  hypertension and chronic renal insufficiency who presents to the ED x2  within one week with complaints of abdominal pain and elevated BP.  One week  ago, the patient was mainly complaining of abdominal pain and sore throat.  The patient was evaluated with a CT that showed possible colitis.  It showed  colonic wall thickening involving the cecum and ascending colon.  The  patient was treated with prednisone and Phenergan and discharged.  The  patient returns to the ED with the same complaints of abdominal pain.  His  BP is noted to be elevated as high as 211/149.  The patient had screening  labs which showed a creatinine of 10.5, CBC within normal limits, UA with  greater than 100 protein, LFTs within normal limits.  [facility] Hospitalist  called for further evaluation and management of hypertensive urgency.   At the time of my evaluation, the patient is alert and oriented x3 without  any complaints of headache, nausea, vomiting, or blurred vision.  The  patient's admitting complaint is essentially some occasional abdominal pain  that causes him to be unable to eat.  He denies any vomiting, denies any  diarrhea, denies any blood in his stools.  He denies ever having any trouble  with his abdomen or any diagnosis of colitis.  As far as his hypertension,  he admits to taking his medications; however, he does have some difficulty  with naming this medicine and supposedly has Catapres-TTS which is not on  his skin.  He is supposed to have an  appointment to get that soon.  Admission is deemed necessary for further evaluation and treatment of the  above problems, i.e., hypertensive urgency and abdominal pain which could be  related to colitis.   PAST MEDICAL HISTORY:  As stated above.   MEDICATIONS:  Medications per old records shows that the patient should be  on:  1.  Norvasc.  2.  Labetalol.  3.  Catapres.   SOCIAL HISTORY:  Negative for tobacco, alcohol, or drugs.   PAST SURGICAL HISTORY:  None.   ALLERGIES:  NONE.   FAMILY HISTORY:  Mother with hypertension.   REVIEW OF SYSTEMS:  As stated in the HPI.   PHYSICAL EXAMINATION:  GENERAL:  The patient is alert and oriented x3 and in  no apparent distress.  HEENT:  Pupils are equal, round, and reactive to light.  Anicteric sclerae.  Moist oral mucosa.  NECK:  No nodes, no JVD.  CHEST:  Clear.  CARDIOVASCULAR:  Regular.  ABDOMEN:  Soft, nontender with minimal palpation.  EXTREMITIES:  No clubbing, cyanosis, or edema.  NEUROLOGIC:  Nonfocal.   LABORATORY  DATA:  BMET:  Sodium 136, potassium 3.6, chloride 104, BUN 97,  glucose 93, creatinine 10.5.  CBC:  White count 6.8, hemoglobin 10.7,  hematocrit 31.5, MCV 87.6, platelets 239.  UA:  Specific gravity 1.008,  urine pH 6.5, glucose negative, urine hemoglobin moderate, bilirubin  negative, urine RBCs 3-6, bacteria rare.  AST 24, ALT 50, alkaline  phosphatase 104, bilirubin 0.7, lipase 35.   ASSESSMENT:  1.  Acute-on-chronic renal failure.  Differential diagnosis includes      progressive renal dysfunction versus noncompliance with medications      versus a new event, i.e., nephritis, which must be considered      considering that the patient has been complaining of sore throat.  2.  Hypertensive urgency.  3.  History of medication noncompliance __________ lack of access.  4.  Abdominal pain x1 week.  Please note that the patient was diagnosed with      colitis by the emergency department one week ago.  Computed  tomography      scan showed colonic wall thickening in the cecum and ascending colon.      The patient was treated with prednisone and Phenergan, and he thinks      this made him feel better.  5.  Sore throat x1 week.  Please note that exam was normal; however, must      rule out strep.   RECOMMENDATIONS:  Recommend that the patient be admitted to a telemetry  floor bed.  For the patient's BP, we will treat with IV Labetalol and resume  Catapres to rule out rebound hypertension.  Review of old records for prior  evaluation of hypertension before any other studies.  As for the patient's  abdominal pain, consider GI evaluation to rule out inflammatory bowel  disease.  Currently is afebrile without leukocytosis, making infectious  __________ colitis less likely.  As for the patient's sore throat, rapid  strep and consider ASL titers.  Will make further recommendations after  review of the above studies.  Thank you in advance.      Ashby Dawes. Polite, M.D.  Electronically Signed     RDP/MEDQ  D:  10/30/2005  T:  10/30/2005  Job:  [id number]

## 2011-02-18 NOTE — Consult Note (Signed)
[name], [name]       ACCOUNT NO.:  [id number]   MEDICAL RECORD NO.:  [id number]          PATIENT TYPE:  INP   LOCATION:  2029                         FACILITY:  [facility]   PHYSICIAN:  Sol Blazing, M.D.DATE OF BIRTH:  1971/05/22   DATE OF CONSULTATION:  10/30/2005  DATE OF DISCHARGE:                                   CONSULTATION   REASON FOR CONSULTATION:  Elevated creatinine.   HISTORY:  The patient is a 40 year old African male seen by [facility] in the hospital in March of 2006 with a creatinine of 4 and  hypertensive nephrosclerosis.  At that time, he had 9 cm kidneys and an MRA,  which was negative for renal artery stenosis.  He had a negative SPEP also  and urinalysis, which was bland.  He was discharged and has said he has seen  one doctor at [facility] one time since then, but he does not recall  the name.  He presents now with abdominal pain and was admitted to the  hospitalist service with a creatinine of 9.7.  He has had some vomiting last  week and is receiving IV fluids.  Blood pressure was high on admission at  180/110.   PAST MEDICAL HISTORY:  1.  Hypertension of uncertain duration.  2.  Known chronic kidney disease.  3.  No other chronic medical conditions.   PAST SURGICAL HISTORY:  None.   SOCIAL HISTORY:  Nonsmoker, nondrinker, married and he is actively working.  He has one daughter.   FAMILY HISTORY:  Mother positive for hypertension.   MEDICATIONS:  Labetalol, clonidine and Norvasc as an outpatient.  It does  appear that he was taking them.  He is followed by [facility].   REVIEW OF SYSTEMS:  GENERAL:  No fevers, chills or sweats.  ENT:  No hair  loss, visual changes, sore throat or difficulty swallowing.  CARDIORESPIRATORY:  No shortness of breath, chest pain, cough or hemoptysis.  GI:  The nausea and vomiting has resolved with symptomatic outpatient  therapy.  He is eating fine in the hospital.  No  abdominal pain currently.  GU:  No dysuria, hematuria or difficulty voiding.  MUSCULOSKELETAL:  No  myalgia or arthralgia.  He has had intermittent mild ankle swelling.  NEUROLOGIC:  No focal numbness or weakness and no history of stroke, TIA or  seizure.  PSYCHIATRIC:  No history of anxiety or depression.   PHYSICAL EXAMINATION:  VITAL SIGNS:  Blood pressure in the emergency room  initially was 211/149, currently blood pressure about 170/100.  GENERAL:  This is a thin, pleasant, well-developed and well-nourished male  in no distress.  SKIN:  Without rash.  HEENT:  PERRLA.  EOMI.  Throat is clear.  NECK:  Supple without JVD.  CHEST:  Clear throughout.  CARDIAC:  Regular rate and rhythm without murmur, rub or gallop.  ABDOMEN:  Soft, nontender, scaphoid with active bowel sounds and no  organomegaly.  EXTREMITIES:  No peripheral edema.  NEUROLOGIC:  Grossly nonfocal motor exam.   LABORATORY:  Sodium 136, potassium 3.4, BUN 101, creatinine 9.7, hemo 11,  white blood  count 6.8, albumin 3.2, calcium 7.2.  Urinalysis:  100 protein,  0 to 2 white blood cells and 3 to 6 red blood cells.   IMPRESSION:  1.  Advancing renal failure, acute on chronic versus progression.  I suspect      this progression, which would be stage IV kidney disease.  2.  Known chronic kidney disease with a creatinine of 4 in March of 2006,      bland urine and 9 cm kidneys at that time.  3.  Hypertension, admitted with a hypertensive crisis currently on a      Catapres patch, labetalol, and Norvasc.  4.  Anemia likely due to chronic kidney disease.   RECOMMENDATIONS:  1.  Watch creatinine, control blood pressure, avoid ACE inhibitor and ARB.      Dialysis education.  Unfortunately, I suspect the renal damage is      progressive and the patient will require dialysis either acutely or      within the next few months.  We will save the left arm and consider      having a permanent access placed with or without Diatek  catheter      depending on the patient's renal function over the next 48 hours.      Sol Blazing, M.D.  Electronically Signed     RDS/MEDQ  D:  10/30/2005  T:  10/31/2005  Job:  [id number]

## 2011-02-18 NOTE — Consult Note (Signed)
[name], HAIRR NO.:  [id number]   MEDICAL RECORD NO.:  [id number]          PATIENT TYPE:  INP   LOCATION:  [id number]                         FACILITY:  [facility]   PHYSICIAN:  Windy Kalata, M.D.DATE OF BIRTH:  [birth year]   DATE OF CONSULTATION:  DATE OF DISCHARGE:                                   CONSULTATION   REFERRING PHYSICIAN:  Ashby Dawes. Polite, M.D.   REASON FOR CONSULTATION:  Chronic renal insufficiency.   HISTORY OF PRESENT ILLNESS:  This is a 40 year old black male admitted on  12/01/04 for vomiting and volume depletion.  Laboratories on admission showed  a serum creatinine of 4.8.  There has decreased slightly to 4.2.  There are  no old serum creatinines for evaluation.  The patient says he was never told  he had any problems with his kidneys.  He denies any history of gross  hematuria, kidney stones, significant use of nonsteroidal medications, or  obstructive type symptoms.  He did have a renal ultrasound done in 2004  which did reveal small kidneys, around 9 cm with some increased  echogenicity.  Repeat ultrasound done this hospitalization shows an increase  in echogenicity and a decrease in size compared to 2004.  His vomiting has  resolved and he currently has no complaints.  An MRA of his renal arteries  done this hospitalization showed no evidence of renal artery stenosis.  He  had been on blood pressure medications in the past but discontinued this  some time ago because of dizziness.   PAST MEDICAL HISTORY:  Hypertension of uncertain duration.   ALLERGIES:  None.   MEDICATIONS:  1.  Labetalol 40 mg t.i.d.  2.  Clonidine 0.1 mg t.i.d.  3.  Protonix 40 mg daily.   SOCIAL HISTORY:  Nonsmoker, nondrinker.  He is married.  Works at [employer].  He has one baby daughter.   FAMILY HISTORY:  Mother positive for hypertension.   REVIEW OF SYSTEMS:  Appetite is good.  No shortness of breath, no chest  pain, no abdominal pain, no  dysuria, no arthritic complaints, no skin  rashes.   PHYSICAL EXAMINATION:  VITAL SIGNS:  Blood pressure 140/89, pulse 69,  temperature 97.3.  GENERAL:  Healthy 40 year old black male in no acute distress.  HEENT:  Sclerae are non-icteric.  Muscles are intact.  NECK:  Reveals no JVD.  LUNGS:  Clear to auscultation.  HEART:  Regular rate and rhythm, without murmurs, rubs, or gallops.  ABDOMEN:  Positive bowel sounds, nontender, nondistended, no  hepatosplenomegaly, no bruits.  EXTREMITIES:  No clubbing, cyanosis, or edema.  Pulses are 2/4 and equal  throughout.  NEUROLOGIC EXAM:  Cranial nerves intact.  Motor and sensory intact.   LABORATORY DATA:  Sodium 137, potassium 3.7, bicarbonate 23, BUN 30,  creatinine 4.2.  Hemoglobin 13.6.   EKG shows left ventricular hypertrophy.   IMPRESSION:  1.  Chronic renal insufficiency secondary to hypertension.  2.  Hypertension.   RECOMMENDATIONS:  1.  Will check a PTH.  2.  Check a phosphorus.  3.  Use blood pressure medicines that he  will be able to get from      [facility] and that is currently being done.  4.  I impressed upon him the need to be compliant and follow up with      appointments.  5.  I talked to him about avoiding nonsteroidal medications and only using      Tylenol for pain.  6.  If his serum creatinine is lower or the same tomorrow, then he can be      discharged but will need outpatient followup.      MTM/MEDQ  D:  12/03/2004  T:  12/04/2004  Job:  [id number]

## 2011-07-08 LAB — URINALYSIS, ROUTINE W REFLEX MICROSCOPIC
Bilirubin Urine: NEGATIVE
Glucose, UA: NEGATIVE
Ketones, ur: 15 — AB
Leukocytes, UA: NEGATIVE
Nitrite: NEGATIVE
Protein, ur: >300 — AB
Specific Gravity, Urine: 1.018
Urobilinogen, UA: 0.2
pH: 6

## 2011-07-08 LAB — POCT I-STAT 4, (NA,K, GLUC, HGB,HCT)
Glucose, Bld: 89
HCT: 48
Hemoglobin: 16.3
Operator id: 181601
Sodium: 132 — ABNORMAL LOW

## 2011-07-08 LAB — URINE MICROSCOPIC-ADD ON

## 2011-07-11 LAB — DIFFERENTIAL
Basophils Absolute: 0
Basophils Relative: 0
Eosinophils Absolute: 0 — ABNORMAL LOW
Eosinophils Relative: 1
Lymphocytes Relative: 18
Lymphs Abs: 1.2
Monocytes Absolute: 0.7
Monocytes Relative: 11
Neutro Abs: 4.3
Neutrophils Relative %: 69

## 2011-07-11 LAB — COMPREHENSIVE METABOLIC PANEL WITH GFR
BUN: 113 — ABNORMAL HIGH
CO2: 21
Chloride: 94 — ABNORMAL LOW
Creatinine, Ser: 16.84 — ABNORMAL HIGH
GFR calc non Af Amer: 3 — ABNORMAL LOW
Glucose, Bld: 91
Total Bilirubin: 1.2

## 2011-07-11 LAB — LACTIC ACID, PLASMA: Lactic Acid, Venous: 0.7

## 2011-07-11 LAB — CBC
HCT: 43.6
Hemoglobin: 14.7
MCHC: 33.7
MCV: 91.8
Platelets: 245
RBC: 4.75
RDW: 14.1
WBC: 6.3

## 2011-07-11 LAB — LIPASE, BLOOD: Lipase: 30

## 2011-07-11 LAB — COMPREHENSIVE METABOLIC PANEL
ALT: 17
AST: 16
Albumin: 4.2
Alkaline Phosphatase: 117
Calcium: 9.6
GFR calc Af Amer: 4 — ABNORMAL LOW
Potassium: 5.5 — ABNORMAL HIGH
Sodium: 134 — ABNORMAL LOW
Total Protein: 8.3

## 2012-06-20 ENCOUNTER — Other Ambulatory Visit: Payer: Self-pay | Admitting: Nephrology

## 2012-06-20 DIAGNOSIS — N644 Mastodynia: Secondary | ICD-10-CM

## 2012-07-03 ENCOUNTER — Ambulatory Visit
Admission: RE | Admit: 2012-07-03 | Discharge: 2012-07-03 | Disposition: A | Discharge status: Home / Self Care | Payer: Medicare Other | Source: Ambulatory Visit | Attending: Nephrology | Admitting: Nephrology

## 2012-07-03 ENCOUNTER — Other Ambulatory Visit: Payer: Self-pay | Admitting: Nephrology

## 2012-07-03 DIAGNOSIS — R05 Cough: Secondary | ICD-10-CM

## 2012-07-04 ENCOUNTER — Encounter (HOSPITAL_COMMUNITY): Payer: Self-pay | Admitting: *Deleted

## 2012-07-04 ENCOUNTER — Emergency Department (HOSPITAL_COMMUNITY)
Admission: EM | Admit: 2012-07-04 | Discharge: 2012-07-04 | Disposition: A | Discharge status: Home / Self Care | Payer: Medicare Other | Attending: Emergency Medicine | Admitting: Emergency Medicine

## 2012-07-04 DIAGNOSIS — M722 Plantar fascial fibromatosis: Secondary | ICD-10-CM | POA: Insufficient documentation

## 2012-07-04 DIAGNOSIS — M79609 Pain in unspecified limb: Secondary | ICD-10-CM | POA: Insufficient documentation

## 2012-07-04 MED ORDER — IBUPROFEN 600 MG PO TABS: 600.0000 mg | ORAL_TABLET | Freq: Four times a day (QID) | ORAL | Status: DC | PRN

## 2012-07-04 NOTE — ED Notes (Signed)
Pt states worked out twice and then started feeling bilateral foot pain--no injury

## 2012-07-04 NOTE — ED Provider Notes (Signed)
History    This chart was scribed for Dot Lanes, MD, MD by Rhae Lerner. The patient was seen in room TR04C and the patient's care was started at 11:29AM.   CSN: [id number]  Arrival date & time 07/04/12  1107     Chief Complaint  Patient presents with  . Foot Pain    bilateral      The history is provided by the patient. No language interpreter was used.   [name] is a 41 y.o. male who presents to the Emergency Department complaining of constant, moderate bilateral foot pain onset 1 week ago. Pt reports that he worked out 1 week ago. He denies injury to feet and any other pain. He reports walking aggravates the pain. Reports taking tylenol without relief.   Past Medical History  Diagnosis Date  . Renal disorder     HD    Past Surgical History  Procedure Date  . Hemodialysis graft to left arm and left leg     No family history on file.  History  Substance Use Topics  . Smoking status: Former Research scientist (life sciences)  . Smokeless tobacco: Not on file  . Alcohol Use: No      Review of Systems  Constitutional: Negative for fever and chills.  Respiratory: Negative for shortness of breath.   Gastrointestinal: Negative for nausea and vomiting.  Neurological: Negative for weakness.  All other systems reviewed and are negative.    Allergies  Review of patient's allergies indicates no known allergies.  Home Medications   Current Outpatient Rx  Name Route Sig Dispense Refill  . ACETAMINOPHEN 500 MG PO TABS Oral Take 1,000 mg by mouth every 6 (six) hours as needed. For pain    . AMLODIPINE BESYLATE 10 MG PO TABS Oral Take 10 mg by mouth daily.    Marland Kitchen CLONIDINE HCL 0.2 MG PO TABS Oral Take 0.2 mg by mouth 2 (two) times daily.    . IBUPROFEN 600 MG PO TABS Oral Take 1 tablet (600 mg total) by mouth every 6 (six) hours as needed for pain. 30 tablet 0    BP 139/95  Pulse 75  Temp 97.6 F (36.4 C) (Oral)  Resp 18  Ht 5\' 11"  (1.803 m)  Wt 150 lb (68.04 kg)  BMI  20.92 kg/m2  SpO2 100%  Physical Exam  Nursing note and vitals reviewed. Constitutional: He is oriented to person, place, and time. He appears well-developed. No distress.  HENT:  Head: Normocephalic and atraumatic.  Eyes: Pupils are equal, round, and reactive to light.  Neck: Normal range of motion.  Cardiovascular: Normal rate and intact distal pulses.   Pulmonary/Chest: No respiratory distress.  Abdominal: Normal appearance. He exhibits no distension.  Musculoskeletal: Normal range of motion.       Feet:  Neurological: He is alert and oriented to person, place, and time. No cranial nerve deficit.  Skin: Skin is warm and dry. No rash noted.  Psychiatric: He has a normal mood and affect. His behavior is normal.    ED Course  Procedures (including critical care time) DIAGNOSTIC STUDIES: Oxygen Saturation is 100% on room air, normal by my interpretation.    COORDINATION OF CARE: 11:31 AM Discussed ED treatment with pt     Labs Reviewed - No data to display Dg Chest 2 View  07/03/2012  *RADIOLOGY REPORT*  Clinical Data: Dry nonproductive cough for 4 months, dialysis patient  CHEST - 2 VIEW  Comparison: Chest x-ray of 05/05/2010, as well  as CT of the chest of 06/25/2010 and 05/06/2010  Findings: The lungs are well aerated.  No focal lesion is seen. The previously noted cavitary lesions within the right lung apex and right lung base are no longer seen.  No definite effusion is noted.  Mediastinal contours appear stable.  The heart is within normal limits in size.  No bony abnormality is seen.  IMPRESSION: No active lung disease.   Original Report Authenticated By: Joretta Bachelor, M.D.      1. Plantar fasciitis       MDM       I personally performed the services described in this documentation, which was scribed in my presence. The recorded information has been reviewed and considered.    Dot Lanes, MD 07/04/12 1134

## 2012-07-05 ENCOUNTER — Ambulatory Visit
Admission: RE | Admit: 2012-07-05 | Discharge: 2012-07-05 | Disposition: A | Discharge status: Home / Self Care | Payer: Medicare Other | Source: Ambulatory Visit | Attending: Nephrology | Admitting: Nephrology

## 2012-07-05 DIAGNOSIS — N644 Mastodynia: Secondary | ICD-10-CM

## 2013-01-29 ENCOUNTER — Other Ambulatory Visit (HOSPITAL_COMMUNITY): Payer: Self-pay | Admitting: Nephrology

## 2013-01-29 DIAGNOSIS — N179 Acute kidney failure, unspecified: Secondary | ICD-10-CM

## 2013-01-30 ENCOUNTER — Other Ambulatory Visit (HOSPITAL_COMMUNITY): Payer: Self-pay | Admitting: Nephrology

## 2013-01-30 ENCOUNTER — Other Ambulatory Visit: Payer: Self-pay | Admitting: Nephrology

## 2013-01-30 ENCOUNTER — Ambulatory Visit (HOSPITAL_COMMUNITY)
Admission: RE | Admit: 2013-01-30 | Discharge: 2013-01-30 | Disposition: A | Discharge status: Home / Self Care | Payer: Medicare Other | Source: Ambulatory Visit | Attending: Nephrology | Admitting: Nephrology

## 2013-01-30 VITALS — BP 135/95 | HR 82 | Temp 97.5°F | Resp 20

## 2013-01-30 DIAGNOSIS — Z992 Dependence on renal dialysis: Secondary | ICD-10-CM

## 2013-01-30 DIAGNOSIS — Y832 Surgical operation with anastomosis, bypass or graft as the cause of abnormal reaction of the patient, or of later complication, without mention of misadventure at the time of the procedure: Secondary | ICD-10-CM | POA: Insufficient documentation

## 2013-01-30 DIAGNOSIS — N179 Acute kidney failure, unspecified: Secondary | ICD-10-CM

## 2013-01-30 DIAGNOSIS — E875 Hyperkalemia: Secondary | ICD-10-CM

## 2013-01-30 DIAGNOSIS — N186 End stage renal disease: Secondary | ICD-10-CM

## 2013-01-30 DIAGNOSIS — T82898A Other specified complication of vascular prosthetic devices, implants and grafts, initial encounter: Secondary | ICD-10-CM | POA: Insufficient documentation

## 2013-01-30 MED ORDER — FENTANYL CITRATE 0.05 MG/ML IJ SOLN
INTRAMUSCULAR | Status: AC
  Filled 2013-01-30: qty 4

## 2013-01-30 MED ORDER — MIDAZOLAM HCL 2 MG/2ML IJ SOLN
INTRAMUSCULAR | Status: AC
  Filled 2013-01-30: qty 4

## 2013-01-30 MED ORDER — ALTEPLASE 100 MG IV SOLR
4.0000 mg | Freq: Once | INTRAVENOUS | Status: DC
  Filled 2013-01-30: qty 4

## 2013-01-30 MED ORDER — HEPARIN SODIUM (PORCINE) 1000 UNIT/ML IJ SOLN
INTRAMUSCULAR | Status: AC
  Filled 2013-01-30: qty 1

## 2013-01-30 MED ORDER — IOHEXOL 300 MG/ML  SOLN
100.0000 mL | Freq: Once | INTRAMUSCULAR | Status: AC | PRN
  Administered 2013-01-30: 1 mL via INTRAVENOUS

## 2013-01-30 NOTE — Progress Notes (Signed)
Pt states he has children at home and he can't wait any longer for IV team to come and pull temporary hd catheter. Pt is to return to hospital tomorrow for declot of AVG. Pt signed Leaving hospital AMA form.

## 2013-01-30 NOTE — ED Notes (Signed)
Pt placed on NRB; O2 sat while lying supine was 75% to 85% on 6L [place]. Will continue to monitor closely

## 2013-01-30 NOTE — H&P (Signed)
Agree with PA note.  Additionally, the patient has hyperkalemia with a K of 6.2 as well as peaked T-waves on his cardiac rhythm strip.  He is currently too high risk for declot, we will place a temporary HD catheter and contact nephrology for emergent hemodialysis.   After his K has normalized, we can declot his thigh graft tomorrow.   Signed,  Criselda Peaches, MD Vascular & Interventional Radiologist [facility]

## 2013-01-30 NOTE — Progress Notes (Signed)
Patient seen on HD. Unfortunately had thigh graft clot- they were unable to open so placed a temporary HD cath due to the fact that the patient had a K of 6.2.  He is to get HD today thru this temp cath, have it removed and then tomorrow get surgical declot of his thigh avg.  He is acceptable of the plan.  BP 143/92, pt is assymptomatic.   Adamary Savary A

## 2013-01-30 NOTE — H&P (Signed)
[name] is an 42 y.o. male.   Chief Complaint: clotted left thigh dialysis graft HPI: Patient with ESRD and thrombosed left thigh AVG presents today for thrombolysis, possible angioplasty/stenting of graft or placement of new catheter if needed.  Past Medical History  Diagnosis Date  . Renal disorder     HD    Past Surgical History  Procedure Laterality Date  . Hemodialysis graft to left arm and left leg      No family history on file. Social History:  reports that he has quit smoking. He does not have any smokeless tobacco history on file. He reports that he does not drink alcohol or use illicit drugs.  Allergies: No Known Allergies  Current outpatient prescriptions:acetaminophen (TYLENOL) 500 MG tablet, Take 1,000 mg by mouth every 6 (six) hours as needed. For pain, Disp: , Rfl: ;  amLODipine (NORVASC) 10 MG tablet, Take 10 mg by mouth daily., Disp: , Rfl: ;  cloNIDine (CATAPRES) 0.2 MG tablet, Take 0.2 mg by mouth 2 (two) times daily., Disp: , Rfl:  ibuprofen (ADVIL,MOTRIN) 600 MG tablet, Take 1 tablet (600 mg total) by mouth every 6 (six) hours as needed for pain., Disp: 30 tablet, Rfl: 0 Current facility-administered medications:alteplase (ACTIVASE) injection 4 mg, 4 mg, Intracatheter, Once, Jacqulynn Cadet, MD  No results found for this or any previous visit (from the past 48 hour(s)). No results found.  Review of Systems  Constitutional: Negative for fever and chills.  Respiratory: Negative for cough and shortness of breath.   Cardiovascular: Negative for chest pain.  Gastrointestinal: Negative for nausea, vomiting and abdominal pain.  Musculoskeletal: Negative for back pain.  Neurological: Negative for headaches.  Endo/Heme/Allergies: Does not bruise/bleed easily.    Blood pressure 128/88, pulse 70, temperature 98.1 F (36.7 C), temperature source Oral, resp. rate 16, SpO2 100.00%. Physical Exam  Constitutional: He is oriented to person, place, and time.  He appears well-developed and well-nourished.  Cardiovascular: Normal rate and regular rhythm.   left thigh AVG  with no thrill/bruit  Respiratory: Effort normal and breath sounds normal.  GI: Soft. Bowel sounds are normal. There is no tenderness.  Musculoskeletal: Normal range of motion. He exhibits no edema.  Neurological: He is alert and oriented to person, place, and time.     Assessment/Plan Pt with ESRD and thrombosed left thigh AVG. Plan is for thrombolysis, possible angioplasty/stenting of graft or placement of new catheter if needed. Details of above d/w pt with his understanding and consent.  ALLRED,D KEVIN 01/30/2013, 11:10 AM

## 2013-01-30 NOTE — ED Notes (Signed)
Pt placed back on 2L [place]. 

## 2013-01-30 NOTE — Progress Notes (Signed)
Notified at 1900 during report that pt. Had completed dialysis. After getting report and looking up information on type of catheter went to see pt. On arrival to d/c [facility] pt. States unable to stay recommended time after catheter d/c'd which is approximately 1 hour. Patient d/c'd with catheter  Due to come back tomorrow for graft which is clotted. Pt. RN to let MD know pt. Is leaving with [facility].  Wynelle Cleveland RN VAST

## 2013-01-30 NOTE — Procedures (Signed)
Interventional Radiology Procedure Note  Procedure: Right IJ temporary HD catheter placed.  Tip in mid RA and ready for use. Complications: None Recommendations: - Dialyze now - Return to IR tomorrow for thigh graft declot  Signed,  Criselda Peaches, MD Vascular & Interventional Radiologist [facility]

## 2013-01-30 NOTE — ED Notes (Addendum)
Temporary HD cath will be placed and pt will have HD in house today as K+ is 6.2.

## 2013-01-31 ENCOUNTER — Ambulatory Visit (HOSPITAL_COMMUNITY)
Admission: RE | Admit: 2013-01-31 | Discharge: 2013-01-31 | Disposition: A | Discharge status: Home / Self Care | Payer: Medicare Other | Source: Ambulatory Visit | Attending: Nephrology | Admitting: Nephrology

## 2013-01-31 ENCOUNTER — Encounter (HOSPITAL_COMMUNITY): Payer: Self-pay

## 2013-01-31 ENCOUNTER — Other Ambulatory Visit (HOSPITAL_COMMUNITY): Payer: Self-pay | Admitting: Nephrology

## 2013-01-31 VITALS — BP 137/84 | HR 68 | Resp 19

## 2013-01-31 DIAGNOSIS — N186 End stage renal disease: Secondary | ICD-10-CM

## 2013-01-31 DIAGNOSIS — I120 Hypertensive chronic kidney disease with stage 5 chronic kidney disease or end stage renal disease: Secondary | ICD-10-CM | POA: Insufficient documentation

## 2013-01-31 DIAGNOSIS — Y832 Surgical operation with anastomosis, bypass or graft as the cause of abnormal reaction of the patient, or of later complication, without mention of misadventure at the time of the procedure: Secondary | ICD-10-CM | POA: Insufficient documentation

## 2013-01-31 DIAGNOSIS — I82819 Embolism and thrombosis of superficial veins of unspecified lower extremities: Secondary | ICD-10-CM | POA: Insufficient documentation

## 2013-01-31 DIAGNOSIS — I871 Compression of vein: Secondary | ICD-10-CM | POA: Insufficient documentation

## 2013-01-31 DIAGNOSIS — Z992 Dependence on renal dialysis: Secondary | ICD-10-CM | POA: Insufficient documentation

## 2013-01-31 DIAGNOSIS — T82898A Other specified complication of vascular prosthetic devices, implants and grafts, initial encounter: Secondary | ICD-10-CM | POA: Insufficient documentation

## 2013-01-31 LAB — POTASSIUM: Potassium: 5.4 mEq/L — ABNORMAL HIGH (ref 3.5–5.1)

## 2013-01-31 MED ORDER — IOHEXOL 300 MG/ML  SOLN
100.0000 mL | Freq: Once | INTRAMUSCULAR | Status: AC | PRN
  Administered 2013-01-31: 1 mL via INTRAVENOUS

## 2013-01-31 MED ORDER — HEPARIN SODIUM (PORCINE) 1000 UNIT/ML IJ SOLN
INTRAMUSCULAR | Status: DC | PRN
  Administered 2013-01-31: 3000 [IU] via INTRAVENOUS

## 2013-01-31 MED ORDER — HEPARIN SODIUM (PORCINE) 1000 UNIT/ML IJ SOLN
INTRAMUSCULAR | Status: AC
  Filled 2013-01-31: qty 1

## 2013-01-31 MED ORDER — FENTANYL CITRATE 0.05 MG/ML IJ SOLN
INTRAMUSCULAR | Status: DC | PRN
  Administered 2013-01-31 (×3): 50 ug via INTRAVENOUS

## 2013-01-31 MED ORDER — ALTEPLASE 100 MG IV SOLR
4.0000 mg | Freq: Once | INTRAVENOUS | Status: DC
  Filled 2013-01-31: qty 4

## 2013-01-31 MED ORDER — CHLORHEXIDINE GLUCONATE 4 % EX LIQD
CUTANEOUS | Status: AC
  Filled 2013-01-31: qty 30

## 2013-01-31 MED ORDER — MIDAZOLAM HCL 2 MG/2ML IJ SOLN
INTRAMUSCULAR | Status: DC | PRN
  Administered 2013-01-31 (×3): 1 mg via INTRAVENOUS

## 2013-01-31 MED ORDER — ALTEPLASE 2 MG IJ SOLR
INTRAMUSCULAR | Status: DC | PRN
  Administered 2013-01-31: 4 mg

## 2013-01-31 MED ORDER — ALTEPLASE 100 MG IV SOLR: 2.0000 mg | Freq: Once | INTRAVENOUS | Status: DC

## 2013-01-31 MED ORDER — MIDAZOLAM HCL 2 MG/2ML IJ SOLN
INTRAMUSCULAR | Status: AC
  Filled 2013-01-31: qty 4

## 2013-01-31 MED ORDER — FENTANYL CITRATE 0.05 MG/ML IJ SOLN
INTRAMUSCULAR | Status: AC
  Filled 2013-01-31: qty 4

## 2013-01-31 NOTE — Procedures (Signed)
Technically successful declot of left thigh graft.  No immediate post procedural complications.

## 2013-01-31 NOTE — H&P (Signed)
[name] is an 42 y.o. male.   Chief Complaint: pt was seen yesterday in IR for potential Lt thigh dialysis graft thrombolysis.   Procedure not performed secondary to high potassium level (6.2). Temporary dialysis catheter placed for urgent dialysis. Now here today for Left thigh dialysis graft thrombolysis and poss angioplasty or stent placement.  Last intervention: 01/2011: declot- successful. Last dialysis: 01/30/13; successful HPI: ESRD; HTN  Past Medical History  Diagnosis Date  . Renal disorder     HD    Past Surgical History  Procedure Laterality Date  . Hemodialysis graft to left arm and left leg      No family history on file. Social History:  reports that he has quit smoking. He does not have any smokeless tobacco history on file. He reports that he does not drink alcohol or use illicit drugs.  Allergies: No Known Allergies   (Not in a hospital admission)  Results for orders placed during the hospital encounter of 01/30/13 (from the past 48 hour(s))  POTASSIUM     Status: Abnormal   Collection Time    01/30/13 11:14 AM      Result Value Range   Potassium 6.2 (*) 3.5 - 5.1 mEq/L   Ir Fluoro Guide Cv Line Right  01/30/2013  *RADIOLOGY REPORT*  IR fluoro guided central venous line right; IR ultrasound guided vascular access right  Date: 01/30/2013  Clinical History: 42 year-old male with end-stage renal disease on hemodialysis via a left thigh loop graft.  He presents with a clotted access for an attempted declot procedure.  His last successful hemodialysis session was Monday, 01/29/2012.  His initial serum potassium today is 6.2 and he has prominent peaked T-waves on his cardiac rhythm strip.  Therefore, nephrology was consulted for emergent dialysis.  We will proceed with placement of a temporary right IJ hemodialysis catheter to facilitate hemodialysis.  The patient can then return interventional radiology later today or tomorrow for declot of his graft.   Procedures Performed: 1. Ultrasound-guided puncture of the right internal jugular vein 2.  Placement of a temporary non tunneled hemodialysis catheter using fluoroscopic guidance  Interventional Radiologist:  Criselda Peaches, MD  Fluoroscopy time: 1 minute 42 seconds  Contrast volume: None  PROCEDURE/FINDINGS:   Informed consent was obtained from the patient following explanation of the procedure, risks, benefits and alternatives. The patient understands, agrees and consents for the procedure. All questions were addressed. A time out was performed.  Maximal barrier sterile technique utilized including caps, mask, sterile gowns, sterile gloves, large sterile drape, hand hygiene, and chlorhexidine skin prep.  The right neck was interrogated with ultrasound.  The right internal jugular vein is widely patent.  Local anesthesia was attained by infiltration of 1% lidocaine.  A small dermatotomy was made with a #11 blade.  Under direct sonographic guidance, the vessel was punctured with a 21-gauge micropuncture needle.  An images obtained and stored for the medical record.  The micro wire was then exchanged for a 0.035" J-wire which was advanced through the right atrium and into the inferior vena cava. The skin tract from the dermatotomy into the IJ was serially dilated and ultimately 820 cm Trialysis temporary hemodialysis catheter was advanced over the wire and positioned with the tip in the mid right atrium.  The catheter flushes and aspirates with ease.  Was secured to the skin with 0-Prolene suture. The patient tolerated the procedure very well, there was no immediate complication.  IMPRESSION:  1.  Successful  placement of a temporary right IJ hemodialysis catheter.  The catheter tip is in the right atrium and ready for immediate use.  2.  The patient to return to interventional radiology tomorrow for declot procedure of his clotted left thigh graft.  Signed,  Criselda Peaches, MD Vascular & Interventional  Radiologist [facility]   Original Report Authenticated By: Jacqulynn Cadet, M.D.    Ir US Guide Vasc Access Right  01/30/2013  *RADIOLOGY REPORT*  IR fluoro guided central venous line right; IR ultrasound guided vascular access right  Date: 01/30/2013  Clinical History: 42 year-old male with end-stage renal disease on hemodialysis via a left thigh loop graft.  He presents with a clotted access for an attempted declot procedure.  His last successful hemodialysis session was Monday, 01/29/2012.  His initial serum potassium today is 6.2 and he has prominent peaked T-waves on his cardiac rhythm strip.  Therefore, nephrology was consulted for emergent dialysis.  We will proceed with placement of a temporary right IJ hemodialysis catheter to facilitate hemodialysis.  The patient can then return interventional radiology later today or tomorrow for declot of his graft.  Procedures Performed: 1. Ultrasound-guided puncture of the right internal jugular vein 2.  Placement of a temporary non tunneled hemodialysis catheter using fluoroscopic guidance  Interventional Radiologist:  Criselda Peaches, MD  Fluoroscopy time: 1 minute 42 seconds  Contrast volume: None  PROCEDURE/FINDINGS:   Informed consent was obtained from the patient following explanation of the procedure, risks, benefits and alternatives. The patient understands, agrees and consents for the procedure. All questions were addressed. A time out was performed.  Maximal barrier sterile technique utilized including caps, mask, sterile gowns, sterile gloves, large sterile drape, hand hygiene, and chlorhexidine skin prep.  The right neck was interrogated with ultrasound.  The right internal jugular vein is widely patent.  Local anesthesia was attained by infiltration of 1% lidocaine.  A small dermatotomy was made with a #11 blade.  Under direct sonographic guidance, the vessel was punctured with a 21-gauge micropuncture needle.  An images obtained and  stored for the medical record.  The micro wire was then exchanged for a 0.035" J-wire which was advanced through the right atrium and into the inferior vena cava. The skin tract from the dermatotomy into the IJ was serially dilated and ultimately 820 cm Trialysis temporary hemodialysis catheter was advanced over the wire and positioned with the tip in the mid right atrium.  The catheter flushes and aspirates with ease.  Was secured to the skin with 0-Prolene suture. The patient tolerated the procedure very well, there was no immediate complication.  IMPRESSION:  1.  Successful placement of a temporary right IJ hemodialysis catheter.  The catheter tip is in the right atrium and ready for immediate use.  2.  The patient to return to interventional radiology tomorrow for declot procedure of his clotted left thigh graft.  Signed,  Criselda Peaches, MD Vascular & Interventional Radiologist [facility]   Original Report Authenticated By: Jacqulynn Cadet, M.D.     Review of Systems  Constitutional: Negative for fever.  Respiratory: Negative for shortness of breath.   Cardiovascular: Negative for chest pain.  Gastrointestinal: Negative for nausea, vomiting and abdominal pain.  Neurological: Negative for headaches.    Blood pressure 127/70, pulse 87, resp. rate 16, SpO2 96.00%. Physical Exam  Constitutional: He is oriented to person, place, and time. He appears well-nourished.  Cardiovascular: Normal rate, regular rhythm and normal heart sounds.   No  murmur heard. Respiratory: Effort normal and breath sounds normal. He has no wheezes.  GI: Soft. Bowel sounds are normal. There is no tenderness.  Musculoskeletal: Normal range of motion.  Left thigh graft clotted  Neurological: He is alert and oriented to person, place, and time.  Skin: Skin is warm and dry.  Psychiatric: He has a normal mood and affect. His behavior is normal. Judgment and thought content normal.     Assessment/Plan Lt  thigh dialysis graft thrombolysis with possible angioplasty/stent placement. Pt was seen yesterday for this procedure but temp HD placed for urgent dialysis - Potassium 6.2; 01/30/13 Re check K today Pt aware of procedure benefits and risks and agreeable to proceed Consent signed and in chart  Darell Saputo A 01/31/2013, 1:34 PM

## 2013-02-08 ENCOUNTER — Encounter (HOSPITAL_COMMUNITY): Payer: Self-pay | Admitting: Emergency Medicine

## 2013-02-08 ENCOUNTER — Emergency Department (HOSPITAL_COMMUNITY)
Admission: EM | Admit: 2013-02-08 | Discharge: 2013-02-08 | Disposition: A | Discharge status: Home / Self Care | Payer: Medicare Other | Attending: Emergency Medicine | Admitting: Emergency Medicine

## 2013-02-08 DIAGNOSIS — I120 Hypertensive chronic kidney disease with stage 5 chronic kidney disease or end stage renal disease: Secondary | ICD-10-CM | POA: Insufficient documentation

## 2013-02-08 DIAGNOSIS — Z992 Dependence on renal dialysis: Secondary | ICD-10-CM | POA: Insufficient documentation

## 2013-02-08 DIAGNOSIS — R509 Fever, unspecified: Secondary | ICD-10-CM | POA: Insufficient documentation

## 2013-02-08 DIAGNOSIS — Z79899 Other long term (current) drug therapy: Secondary | ICD-10-CM | POA: Insufficient documentation

## 2013-02-08 DIAGNOSIS — N186 End stage renal disease: Secondary | ICD-10-CM | POA: Insufficient documentation

## 2013-02-08 DIAGNOSIS — H9202 Otalgia, left ear: Secondary | ICD-10-CM

## 2013-02-08 DIAGNOSIS — Z87891 Personal history of nicotine dependence: Secondary | ICD-10-CM | POA: Insufficient documentation

## 2013-02-08 DIAGNOSIS — H9209 Otalgia, unspecified ear: Secondary | ICD-10-CM | POA: Insufficient documentation

## 2013-02-08 HISTORY — DX: Essential (primary) hypertension: I10

## 2013-02-08 MED ORDER — ANTIPYRINE-BENZOCAINE 5.4-1.4 % OT SOLN
3.0000 [drp] | Freq: Once | OTIC | Status: AC
  Administered 2013-02-08: 4 [drp] via OTIC
  Filled 2013-02-08: qty 10

## 2013-02-08 NOTE — ED Notes (Signed)
Left ear pain and fever x 4 days

## 2013-02-09 NOTE — ED Provider Notes (Signed)
History     CSN: [id number]  Arrival date & time 02/08/13  1314   First MD Initiated Contact with Patient 02/08/13 1329      Chief Complaint  Patient presents with  . Otalgia    (Consider location/radiation/quality/duration/timing/severity/associated sxs/prior treatment) HPI Comments: Patient is a 42 year old male who presents with left ear pain and subjective fever. It has been worsening for the past 4 days. It is sharp without radiation. He admits to ear popping as well. He has not tried anything to make his pain better. Nothing makes his pain worse. He denies rhinorrhea, congestion, sore throat, cough, headache, fever, chills, dental pain/problems, nausea, vomiting, abdominal pain, shortness of breath.   The history is provided by the patient. No language interpreter was used.    Past Medical History  Diagnosis Date  . Renal disorder     HD  . Hypertension     Past Surgical History  Procedure Laterality Date  . Hemodialysis graft to left arm and left leg      No family history on file.  History  Substance Use Topics  . Smoking status: Former Research scientist (life sciences)  . Smokeless tobacco: Not on file  . Alcohol Use: No      Review of Systems  Constitutional: Negative for fever and chills.  HENT: Positive for ear pain. Negative for congestion, sore throat, rhinorrhea, neck pain, neck stiffness, dental problem, postnasal drip, sinus pressure and ear discharge.   Respiratory: Negative for shortness of breath.   Gastrointestinal: Negative for nausea, vomiting and abdominal pain.  All other systems reviewed and are negative.    Allergies  Review of patient's allergies indicates no known allergies.  Home Medications   Current Outpatient Rx  Name  Route  Sig  Dispense  Refill  . acetaminophen (TYLENOL) 500 MG tablet   Oral   Take 1,000 mg by mouth every 6 (six) hours as needed for pain. For pain         . amLODipine (NORVASC) 10 MG tablet   Oral   Take 10 mg by mouth  daily.         . cinacalcet (SENSIPAR) 60 MG tablet   Oral   Take 60 mg by mouth daily.         . cloNIDine (CATAPRES) 0.2 MG tablet   Oral   Take 0.2 mg by mouth daily.            BP 125/78  Pulse 99  Temp(Src) 98.2 F (36.8 C)  Resp 16  SpO2 99%  Physical Exam  Nursing note and vitals reviewed. Constitutional: He is oriented to person, place, and time. He appears well-developed and well-nourished. No distress.  HENT:  Head: Normocephalic and atraumatic.  Right Ear: Tympanic membrane, external ear and ear canal normal. No drainage or tenderness. No mastoid tenderness. No decreased hearing is noted.  Left Ear: Tympanic membrane, external ear and ear canal normal. No drainage or tenderness. No mastoid tenderness. No decreased hearing is noted.  Nose: Nose normal.  Retracted tm in left ear  Eyes: Conjunctivae are normal.  Neck: Normal range of motion. No tracheal deviation present.  Cardiovascular: Normal rate, regular rhythm and normal heart sounds.   Pulmonary/Chest: Effort normal and breath sounds normal. No stridor.  Abdominal: Soft. He exhibits no distension. There is no tenderness.  Musculoskeletal: Normal range of motion.  Neurological: He is alert and oriented to person, place, and time.  Skin: Skin is warm and dry. He is not  diaphoretic.  Psychiatric: He has a normal mood and affect. His behavior is normal.    ED Course  Procedures (including critical care time)  Labs Reviewed - No data to display No results found.   1. Otalgia of left ear       MDM  Patient presents with otalgia of left ear x 4 days. TM retracted on left, no injection, canal normal. No mastoid tenderness. Suspect a viral process. Responded well to auralgan drops in ED, given bottle for home. Discussed OTC symptomatic treatment. Return instructions given. Vital signs stable for discharge. Patient / Family / Caregiver informed of clinical course, understand medical decision-making  process, and agree with plan.   Medications  antipyrine-benzocaine (AURALGAN) otic solution 3-4 drop (4 drops Left Ear Given 02/08/13 1417)           Elwyn Lade, PA-C 02/09/13 2049

## 2013-02-10 NOTE — ED Provider Notes (Signed)
Medical screening examination/treatment/procedure(s) were performed by non-physician practitioner and as supervising physician I was immediately available for consultation/collaboration.  Threasa Beards, MD 02/10/13 [phone]

## 2013-08-02 ENCOUNTER — Emergency Department (HOSPITAL_COMMUNITY)
Admission: EM | Admit: 2013-08-02 | Discharge: 2013-08-02 | Disposition: A | Discharge status: Home / Self Care | Payer: Medicare Other | Attending: Emergency Medicine | Admitting: Emergency Medicine

## 2013-08-02 ENCOUNTER — Encounter (HOSPITAL_COMMUNITY): Payer: Self-pay | Admitting: Emergency Medicine

## 2013-08-02 DIAGNOSIS — I120 Hypertensive chronic kidney disease with stage 5 chronic kidney disease or end stage renal disease: Secondary | ICD-10-CM | POA: Insufficient documentation

## 2013-08-02 DIAGNOSIS — Z87891 Personal history of nicotine dependence: Secondary | ICD-10-CM | POA: Insufficient documentation

## 2013-08-02 DIAGNOSIS — S0180XA Unspecified open wound of other part of head, initial encounter: Secondary | ICD-10-CM | POA: Insufficient documentation

## 2013-08-02 DIAGNOSIS — Z992 Dependence on renal dialysis: Secondary | ICD-10-CM | POA: Insufficient documentation

## 2013-08-02 DIAGNOSIS — IMO0002 Reserved for concepts with insufficient information to code with codable children: Secondary | ICD-10-CM | POA: Insufficient documentation

## 2013-08-02 DIAGNOSIS — Y939 Activity, unspecified: Secondary | ICD-10-CM | POA: Insufficient documentation

## 2013-08-02 DIAGNOSIS — Z79899 Other long term (current) drug therapy: Secondary | ICD-10-CM | POA: Insufficient documentation

## 2013-08-02 DIAGNOSIS — S0181XA Laceration without foreign body of other part of head, initial encounter: Secondary | ICD-10-CM

## 2013-08-02 DIAGNOSIS — Y9289 Other specified places as the place of occurrence of the external cause: Secondary | ICD-10-CM | POA: Insufficient documentation

## 2013-08-02 DIAGNOSIS — N186 End stage renal disease: Secondary | ICD-10-CM | POA: Insufficient documentation

## 2013-08-02 NOTE — ED Notes (Addendum)
Pt given d/c instructions and verbalized understanding. NAD at this time. VS are stable. Pressure dressing reinforced on head.

## 2013-08-02 NOTE — ED Notes (Addendum)
EMS reports pt hit his head 10 days ago on the corner of a door. Since then he has had slowly increased swelling at the sight of injury which began bleeding while he was sleeping tonight. No dizziness, No LOC. Pt is on dialysis.

## 2013-08-02 NOTE — ED Provider Notes (Addendum)
CSN: [id number]     Arrival date & time 08/02/13  0148 History   First MD Initiated Contact with Patient 08/02/13 [phone]     Chief Complaint  Patient presents with  . Head Laceration   (Consider location/radiation/quality/duration/timing/severity/associated sxs/prior Treatment) HPI Comments: Patient is a 42 year old male with past medical history of hypertension which led to end-stage renal disease for which he is on hemodialysis. He presents today with complaints of bleeding from this for head. He states that 10 days ago he hit his head on a doorway and caused a small laceration. He held pressure and the bleeding stopped. This evening he woke up noticed he was having significant bleeding from this area.  Patient is a 42 y.o. male presenting with scalp laceration. The history is provided by the patient.  Head Laceration This is a new problem. The current episode started more than 1 week ago. The problem has been rapidly worsening. Pertinent negatives include no headaches. Nothing aggravates the symptoms. Nothing relieves the symptoms. He has tried nothing for the symptoms.    Past Medical History  Diagnosis Date  . Renal disorder     HD  . Hypertension    Past Surgical History  Procedure Laterality Date  . Hemodialysis graft to left arm and left leg     History reviewed. No pertinent family history. History  Substance Use Topics  . Smoking status: Former Research scientist (life sciences)  . Smokeless tobacco: Not on file  . Alcohol Use: No    Review of Systems  Neurological: Negative for headaches.  All other systems reviewed and are negative.    Allergies  Review of patient's allergies indicates no known allergies.  Home Medications   Current Outpatient Rx  Name  Route  Sig  Dispense  Refill  . acetaminophen (TYLENOL) 500 MG tablet   Oral   Take 1,000 mg by mouth every 6 (six) hours as needed for pain. For pain         . amLODipine (NORVASC) 10 MG tablet   Oral   Take 10 mg by mouth  daily.         . cinacalcet (SENSIPAR) 60 MG tablet   Oral   Take 60 mg by mouth daily.         . cloNIDine (CATAPRES) 0.2 MG tablet   Oral   Take 0.2 mg by mouth daily.           BP 162/106  Pulse 65  Temp(Src) 97.9 F (36.6 C) (Oral)  Resp 16  SpO2 100% Physical Exam  Nursing note and vitals reviewed. Constitutional: He is oriented to person, place, and time. He appears well-developed and well-nourished. No distress.  HENT:  Head: Normocephalic.  There is an area to the right forehead and has a dressing in place. When that is removed there is brisk arterial bleeding coming from this area.  Eyes: EOM are normal. Pupils are equal, round, and reactive to light.  Neck: Normal range of motion. Neck supple.  Neurological: He is alert and oriented to person, place, and time. No cranial nerve deficit. He exhibits normal muscle tone. Coordination normal.  Skin: Skin is warm and dry. He is not diaphoretic.    ED Course  Procedures (including critical care time) Labs Review Labs Reviewed - No data to display Imaging Review No results found.  EKG Interpretation   None       MDM  No diagnosis found. Patient is a 42 year old male with history of hypertension causing end-stage  renal disease for which he is on dialysis. He hit his head on a doorway 10 days ago in this area started bleeding significantly this evening. He presented here with arterial bleeding from this area. I had considered closing this with a stitch, however I was afraid that I would poke more holes and potentially exacerbate the problem. A compressive dressing was placed using Kling and Coban and and this appeared to stop the bleeding. He appears to be stable for discharge. I've advised him to keep this in place for 2 days before removing it. If his bleeding recurs he is to hold direct pressure and return to the emergency department.  I also discussed this case with Dr. Grandville Silos from general surgery who agrees  with this plan.    Veryl Speak, MD 08/02/13 [place], MD 08/02/13 [phone]

## 2013-08-06 ENCOUNTER — Encounter (HOSPITAL_COMMUNITY): Payer: Self-pay | Admitting: Emergency Medicine

## 2013-08-06 ENCOUNTER — Emergency Department (HOSPITAL_COMMUNITY)
Admission: EM | Admit: 2013-08-06 | Discharge: 2013-08-06 | Disposition: A | Discharge status: Home / Self Care | Payer: Medicare Other | Attending: Emergency Medicine | Admitting: Emergency Medicine

## 2013-08-06 DIAGNOSIS — Y929 Unspecified place or not applicable: Secondary | ICD-10-CM | POA: Insufficient documentation

## 2013-08-06 DIAGNOSIS — Y939 Activity, unspecified: Secondary | ICD-10-CM | POA: Insufficient documentation

## 2013-08-06 DIAGNOSIS — Z79899 Other long term (current) drug therapy: Secondary | ICD-10-CM | POA: Insufficient documentation

## 2013-08-06 DIAGNOSIS — I10 Essential (primary) hypertension: Secondary | ICD-10-CM | POA: Insufficient documentation

## 2013-08-06 DIAGNOSIS — S0180XA Unspecified open wound of other part of head, initial encounter: Secondary | ICD-10-CM | POA: Insufficient documentation

## 2013-08-06 DIAGNOSIS — S0190XS Unspecified open wound of unspecified part of head, sequela: Secondary | ICD-10-CM

## 2013-08-06 DIAGNOSIS — Z87448 Personal history of other diseases of urinary system: Secondary | ICD-10-CM | POA: Insufficient documentation

## 2013-08-06 DIAGNOSIS — Z87891 Personal history of nicotine dependence: Secondary | ICD-10-CM | POA: Insufficient documentation

## 2013-08-06 DIAGNOSIS — IMO0002 Reserved for concepts with insufficient information to code with codable children: Secondary | ICD-10-CM | POA: Insufficient documentation

## 2013-08-06 NOTE — ED Notes (Signed)
Pt bleeding uncontrolled, RN at bedside. Dr.Otter informed and at bedside

## 2013-08-06 NOTE — ED Provider Notes (Signed)
CSN: [id number]     Arrival date & time 08/06/13  0101 History   First MD Initiated Contact with Patient 08/06/13 0132     Chief Complaint  Patient presents with  . Puncture Wound   (Consider location/radiation/quality/duration/timing/severity/associated sxs/prior Treatment) HPI 42 year old male presents to emergency department from home via EMS with complaint of bleeding.  Head wound.  Patient reports he struck his head about 2 weeks ago.  Patient was seen in the emergency department 4 days ago, when he began to have spontaneous bleeding form the area.  Wound was finally controlled with a Coban pressure dressing.  He reports he had been doing well until tonight.  He has not been picking at the area, no trauma to the area.  He has pain when pressure applied.  No other complaints.  He denies missing any dialysis. Past Medical History  Diagnosis Date  . Renal disorder     HD  . Hypertension    Past Surgical History  Procedure Laterality Date  . Hemodialysis graft to left arm and left leg     History reviewed. No pertinent family history. History  Substance Use Topics  . Smoking status: Former Research scientist (life sciences)  . Smokeless tobacco: Not on file  . Alcohol Use: No    Review of Systems  All other systems reviewed and are negative.    Allergies  Review of patient's allergies indicates no known allergies.  Home Medications   Current Outpatient Rx  Name  Route  Sig  Dispense  Refill  . acetaminophen (TYLENOL) 500 MG tablet   Oral   Take 1,000 mg by mouth every 6 (six) hours as needed for pain. For pain         . amLODipine (NORVASC) 10 MG tablet   Oral   Take 10 mg by mouth daily.         . cinacalcet (SENSIPAR) 60 MG tablet   Oral   Take 60 mg by mouth daily.         . cloNIDine (CATAPRES) 0.2 MG tablet   Oral   Take 0.2 mg by mouth daily.           BP 143/88  Pulse 69  Temp(Src) 98.5 F (36.9 C) (Oral)  Resp 18  SpO2 100% Physical Exam  Nursing note and vitals  reviewed. Constitutional: He appears distressed.  HENT:  Has 3 cm lesion to right upper forehead.  There is central necrosis.  In the center of this necrosis, there is a pulsatile mass.  There is pulsatile bleeding from this mass.  Area is tender to palpation.  There is no purulent drainage.  Eyes: Conjunctivae and EOM are normal. Pupils are equal, round, and reactive to light.  Cardiovascular: Normal rate, regular rhythm, normal heart sounds and intact distal pulses.  Exam reveals no gallop and no friction rub.   No murmur heard. Pulmonary/Chest: Effort normal and breath sounds normal. No respiratory distress. He has no wheezes. He has no rales. He exhibits no tenderness.    ED Course  Procedures (including critical care time) Labs Review Labs Reviewed - No data to display Imaging Review No results found.  LACERATION REPAIR Performed by: Kalman Drape Authorized by: Kalman Drape Consent: Verbal consent obtained. Risks and benefits: risks, benefits and alternatives were discussed Consent given by: patient Patient identity confirmed: provided demographic data Prepped and Draped in normal sterile fashion Wound explored  Laceration Location: Right for head  Laceration Length: Lesion, approximately 3 cm.  Bleeding coming  from 0.5 cm at center  No Foreign Bodies seen or palpated  Anesthesia: local infiltration  Local anesthetic: lidocaine 2% with epinephrine  Anesthetic total: 8 ml  Irrigation method: syringe Amount of cleaning: standard  Skin closure: 5.0 Prolene   Number of sutures: 1   Technique: Figure 8  Patient tolerance: Patient tolerated the procedure well with no immediate complications.  MDM   1. Open head wound, sequela    42 year old male with necrotic lesion to right for head with underlying pulsatile mass.  I am not sure of the source of this pulsatile bleeding, possible from an aneurysm off with a professional artery.  Patient will need further  evaluation and debridement of this area.  We'll refer to both trauma clinic and Dr. Migdalia Dk with plastic surgery.  Bleeding has been controlled with a figure-of-eight stitch through the hole in the pulsatile mass.  A dressing with Coban has been applied.  Patient instructed to contact offices in the morning for followup.    Kalman Drape, MD 08/06/13 [phone]

## 2013-08-06 NOTE — ED Notes (Signed)
Pt wound above right forehead x 2 weeks.  Was seen here 10/31 due to bleeding; unable to suture, dressed and discharged.  Tonight wound began bleeding through dressing, leaking down pt face and head.  EMS held pressure entire route to ED.

## 2013-08-06 NOTE — ED Notes (Signed)
Bleeding remains controlled.

## 2013-08-06 NOTE — ED Notes (Addendum)
Pt roomed in A5 directly from ambulance.  Pressure being held to right forehead/hairline old dressing.  Dressing carefully removed utilizing NS to remove sticking bandage.  Round, buldging wound approx quarter size with blood spurting small artery in nature.  Pressure applied, MD called to room. Quick clot attempted without success.  Pressure continued.  No real laceration noted to surface, just pinhole with blood spurting.  Dermabond attempted by Dr Sharol Given without success.  Suture cart pulled, a figure 8 suture placed by Dr Sharol Given, bleeding appeared controlled.  Quick clot then applied followed by 4x4 and 3" coban.  No bleed through noted.  Triage completed following procedure due to the nature of the bleeding.

## 2013-08-07 ENCOUNTER — Telehealth (HOSPITAL_COMMUNITY): Payer: Self-pay | Admitting: Emergency Medicine

## 2013-08-07 NOTE — Telephone Encounter (Signed)
No answer, could not leave message. 

## 2013-08-08 NOTE — Telephone Encounter (Signed)
Advised them that they should follow up with Dr. Migdalia Dk only. They should call back if they have any questions.

## 2013-08-14 ENCOUNTER — Emergency Department (HOSPITAL_COMMUNITY)
Admission: EM | Admit: 2013-08-14 | Discharge: 2013-08-14 | Disposition: A | Discharge status: Home / Self Care | Payer: Medicare Other | Attending: Emergency Medicine | Admitting: Emergency Medicine

## 2013-08-14 ENCOUNTER — Encounter (HOSPITAL_COMMUNITY): Payer: Self-pay | Admitting: Emergency Medicine

## 2013-08-14 DIAGNOSIS — Z992 Dependence on renal dialysis: Secondary | ICD-10-CM | POA: Insufficient documentation

## 2013-08-14 DIAGNOSIS — K089 Disorder of teeth and supporting structures, unspecified: Secondary | ICD-10-CM | POA: Insufficient documentation

## 2013-08-14 DIAGNOSIS — Z79899 Other long term (current) drug therapy: Secondary | ICD-10-CM | POA: Insufficient documentation

## 2013-08-14 DIAGNOSIS — K029 Dental caries, unspecified: Secondary | ICD-10-CM | POA: Insufficient documentation

## 2013-08-14 DIAGNOSIS — N186 End stage renal disease: Secondary | ICD-10-CM | POA: Insufficient documentation

## 2013-08-14 DIAGNOSIS — Z87891 Personal history of nicotine dependence: Secondary | ICD-10-CM | POA: Insufficient documentation

## 2013-08-14 DIAGNOSIS — I120 Hypertensive chronic kidney disease with stage 5 chronic kidney disease or end stage renal disease: Secondary | ICD-10-CM | POA: Insufficient documentation

## 2013-08-14 MED ORDER — IBUPROFEN 800 MG PO TABS: 800.0000 mg | ORAL_TABLET | Freq: Three times a day (TID) | ORAL | Status: DC

## 2013-08-14 MED ORDER — HYDROCODONE-ACETAMINOPHEN 5-325 MG PO TABS: 1.0000 | ORAL_TABLET | ORAL | Status: DC | PRN

## 2013-08-14 MED ORDER — PENICILLIN V POTASSIUM 500 MG PO TABS: 500.0000 mg | ORAL_TABLET | Freq: Four times a day (QID) | ORAL | Status: DC

## 2013-08-14 NOTE — ED Notes (Signed)
Per ems report, Pt is from home. Ongoing 1 week pain in left jaw area and headache that started today on the left side. Per ems, no notable swelling, drainage, or fever . BP166/98, HR80

## 2013-08-14 NOTE — ED Provider Notes (Signed)
CSN: [id number]     Arrival date & time 08/14/13  1941 History  This chart was scribed for non-physician practitioner Abigail Butts, PA-C working with Blanchard Kelch, MD by Anastasia Pall, ED scribe. This patient was seen in room TR07C/TR07C and the patient's care was started at 8:54 PM.   Chief Complaint  Patient presents with  . Dental Pain   The history is provided by the patient and medical records. No language interpreter was used.   HPI Comments: [name] is a 42 y.o. male who presents to the Emergency Department complaining of gradual, moderate, left, lower dental pain, with a severity of 9/10, without swelling, onset one week ago. He reports some of his left lower teeth are broken and painful to the touch. He reports associated left sided facial pain and ear pain. He reports taking Ibuprofen, without relief. He denies fever, chills, nausea, emesis, and any other associated symptoms. He has a h/o renal disorder, hypertension, and a hemodialysis graft to his left arm and left leg.   PCP -No PCP Per Patient  Past Medical History  Diagnosis Date  . Renal disorder     HD  . Hypertension    Past Surgical History  Procedure Laterality Date  . Hemodialysis graft to left arm and left leg     History reviewed. No pertinent family history. History  Substance Use Topics  . Smoking status: Former Research scientist (life sciences)  . Smokeless tobacco: Not on file  . Alcohol Use: No    Review of Systems  Constitutional: Negative for fever, chills and appetite change.  HENT: Positive for dental problem (lower, left, broken teeth, with pain) and ear pain (left along with left facial pain). Negative for drooling, facial swelling, nosebleeds, postnasal drip, rhinorrhea and trouble swallowing.   Eyes: Negative for pain and redness.  Respiratory: Negative for cough and wheezing.   Cardiovascular: Negative for chest pain.  Gastrointestinal: Negative for nausea, vomiting and abdominal pain.   Musculoskeletal: Negative for neck pain and neck stiffness.  Skin: Negative for color change and rash.  Neurological: Positive for headaches. Negative for weakness and light-headedness.  All other systems reviewed and are negative.   Allergies  Review of patient's allergies indicates no known allergies.  Home Medications   Current Outpatient Rx  Name  Route  Sig  Dispense  Refill  . acetaminophen (TYLENOL) 500 MG tablet   Oral   Take 1,000 mg by mouth every 6 (six) hours as needed for pain. For pain         . amLODipine (NORVASC) 10 MG tablet   Oral   Take 10 mg by mouth daily.         . cinacalcet (SENSIPAR) 60 MG tablet   Oral   Take 60 mg by mouth daily.         . cloNIDine (CATAPRES) 0.2 MG tablet   Oral   Take 0.2 mg by mouth daily.          Marland Kitchen HYDROcodone-acetaminophen (NORCO/VICODIN) 5-325 MG per tablet   Oral   Take 1 tablet by mouth every 4 (four) hours as needed.   15 tablet   0   . penicillin v potassium (VEETID) 500 MG tablet   Oral   Take 1 tablet (500 mg total) by mouth 4 (four) times daily.   40 tablet   0    Triage Vitals: BP 150/90  Pulse 72  Temp(Src) 98.1 F (36.7 C) (Oral)  Resp 16  SpO2 99%  Physical Exam  Nursing note and vitals reviewed. Constitutional: He appears well-developed and well-nourished.  HENT:  Head: Normocephalic and atraumatic.  Right Ear: Tympanic membrane, external ear and ear canal normal.  Left Ear: Tympanic membrane, external ear and ear canal normal.  Nose: Nose normal. Right sinus exhibits no maxillary sinus tenderness and no frontal sinus tenderness. Left sinus exhibits no maxillary sinus tenderness and no frontal sinus tenderness.  Mouth/Throat: Uvula is midline, oropharynx is clear and moist and mucous membranes are normal. No oral lesions. Abnormal dentition. Dental caries present. No uvula swelling or lacerations. No oropharyngeal exudate, posterior oropharyngeal edema, posterior oropharyngeal erythema  or tonsillar abscesses.  No facial swelling Caries of teeth #18 through 20 without obvious fracture  Eyes: Conjunctivae are normal. Pupils are equal, round, and reactive to light. Right eye exhibits no discharge. Left eye exhibits no discharge.  Neck: Normal range of motion. Neck supple.  Cardiovascular: Normal rate, regular rhythm, normal heart sounds and intact distal pulses.   No murmur heard. Pulmonary/Chest: Effort normal and breath sounds normal. No respiratory distress. He has no wheezes.  Lymphadenopathy:    He has no cervical adenopathy.  Neurological: He is alert. He exhibits normal muscle tone. Coordination normal.  Skin: Skin is warm and dry.  Psychiatric: He has a normal mood and affect.    ED Course  Procedures (including critical care time)  DIAGNOSTIC STUDIES: Oxygen Saturation is 99% on room air, normal by my interpretation.    COORDINATION OF CARE: 8:57 PM-Discussed treatment plan with pt at bedside and pt agreed to plan. Will give pt referral to dentist.  Labs Review Labs Reviewed - No data to display Imaging Review No results found.  EKG Interpretation   None       MDM   1. Pain due to dental caries      Fountain Abdou Barham presents with dental pain from caries.  Patient with toothache.  No gross abscess.  Exam unconcerning for Ludwig's angina or spread of infection.  Will treat with penicillin and pain medicine.  Urged patient to follow-up with dentist.    It has been determined that no acute conditions requiring further emergency intervention are present at this time. The patient/guardian have been advised of the diagnosis and plan. We have discussed signs and symptoms that warrant return to the ED, such as changes or worsening in symptoms.   Vital signs are stable at discharge.   BP 150/90  Pulse 72  Temp(Src) 98.1 F (36.7 C) (Oral)  Resp 16  SpO2 99%  Patient/guardian has voiced understanding and agreed to follow-up with the PCP or  specialist.    I personally performed the services described in this documentation, which was scribed in my presence. The recorded information has been reviewed and is accurate.   Jarrett Soho Tyree Vandruff, PA-C 08/14/13 2117

## 2013-08-14 NOTE — ED Notes (Signed)
Pt alert, NAD, calm, interactive, c/o L lower back (molars) toothache, radiates to HA, onset last week, (denies: fever, nv, dizziness), took ibuprofen (not sure of dose).

## 2013-08-14 NOTE — ED Notes (Signed)
Presents with left sided dlower jaw dental pain, dental caries noted. No swelling noted. Pain is rated 9/10 for one week.

## 2013-08-15 NOTE — ED Provider Notes (Signed)
Medical screening examination/treatment/procedure(s) were performed by non-physician practitioner and as supervising physician I was immediately available for consultation/collaboration.  EKG Interpretation   None         Blanchard Kelch, MD 08/15/13 1300

## 2013-10-28 ENCOUNTER — Ambulatory Visit (INDEPENDENT_AMBULATORY_CARE_PROVIDER_SITE_OTHER): Payer: Medicare Other | Admitting: Surgery

## 2013-10-28 ENCOUNTER — Telehealth (INDEPENDENT_AMBULATORY_CARE_PROVIDER_SITE_OTHER): Payer: Self-pay

## 2013-10-28 NOTE — Telephone Encounter (Signed)
Lmom for pt to r/s missed appt.

## 2013-10-30 ENCOUNTER — Encounter (INDEPENDENT_AMBULATORY_CARE_PROVIDER_SITE_OTHER): Payer: Self-pay | Admitting: Surgery

## 2014-03-21 ENCOUNTER — Encounter (INDEPENDENT_AMBULATORY_CARE_PROVIDER_SITE_OTHER): Payer: Self-pay | Admitting: Surgery

## 2014-03-21 ENCOUNTER — Ambulatory Visit (INDEPENDENT_AMBULATORY_CARE_PROVIDER_SITE_OTHER): Payer: Medicare Other | Admitting: Surgery

## 2014-03-21 ENCOUNTER — Encounter (INDEPENDENT_AMBULATORY_CARE_PROVIDER_SITE_OTHER): Payer: Self-pay

## 2014-03-21 VITALS — BP 122/75 | HR 82 | Temp 98.2°F | Resp 16 | Ht 71.0 in | Wt 155.8 lb

## 2014-03-21 DIAGNOSIS — N186 End stage renal disease: Secondary | ICD-10-CM | POA: Insufficient documentation

## 2014-03-21 DIAGNOSIS — N2581 Secondary hyperparathyroidism of renal origin: Secondary | ICD-10-CM | POA: Insufficient documentation

## 2014-03-21 DIAGNOSIS — Z992 Dependence on renal dialysis: Secondary | ICD-10-CM

## 2014-03-21 NOTE — Patient Instructions (Signed)

## 2014-03-21 NOTE — Progress Notes (Signed)
General Surgery [facility]  Chief Complaint  Patient presents with  . New Evaluation    secondary hyperparathyroidism - referral from Dr. Jamal Maes    HISTORY: The patient is a 43 year old male referred by his nephrologist for evaluation for total parathyroidectomy with autotransplantation for management of secondary hyperparathyroidism. Patient has end-stage renal disease. He has been on hemodialysis since 2007. He dialyzes at the [facility] on Mondays Wednesdays and Fridays. His current access is in his left thigh.  Patient has failed treatment with Sensipar. He developed gastrointestinal side effects. He notes chronic fatigue. He has itching.  Recent laboratory studies show an intact PTH level elevated at 959, calcium 10.0, and phosphorus very high at 11.2. His calcium times phosphorus product is markedly elevated at 109.  Patient has had no prior head or neck surgery.  Past Medical History  Diagnosis Date  . Renal disorder     HD  . Hypertension   . Anemia   . Thyroid disease     Current Outpatient Prescriptions  Medication Sig Dispense Refill  . acetaminophen (TYLENOL) 500 MG tablet Take 1,000 mg by mouth every 6 (six) hours as needed for pain. For pain      . amLODipine (NORVASC) 10 MG tablet Take 10 mg by mouth daily.      . cinacalcet (SENSIPAR) 60 MG tablet Take 60 mg by mouth daily.      . cloNIDine (CATAPRES) 0.2 MG tablet Take 0.2 mg by mouth daily.       Marland Kitchen HYDROcodone-acetaminophen (NORCO/VICODIN) 5-325 MG per tablet Take 1 tablet by mouth every 4 (four) hours as needed.  15 tablet  0  . penicillin v potassium (VEETID) 500 MG tablet Take 1 tablet (500 mg total) by mouth 4 (four) times daily.  40 tablet  0  . sevelamer carbonate (RENVELA) 800 MG tablet Take 800 mg by mouth 3 (three) times daily with meals.       No current facility-administered medications for this visit.    No Known Allergies  History reviewed.  No pertinent family history.  History   Social History  . Marital Status: Single    Spouse Name: N/A    Number of Children: N/A  . Years of Education: N/A   Social History Main Topics  . Smoking status: Former Smoker    Quit date: 03/21/1986  . Smokeless tobacco: None  . Alcohol Use: No  . Drug Use: No  . Sexual Activity: None   Other Topics Concern  . None   Social History Narrative  . None    REVIEW OF SYSTEMS - PERTINENT POSITIVES ONLY: Chronic fatigue. Itching.  EXAM: Filed Vitals:   03/21/14 1421  BP: 122/75  Pulse: 82  Temp: 98.2 F (36.8 C)  Resp: 16    GENERAL: well-developed, well-nourished, no acute distress HEENT: normocephalic; pupils equal and reactive; sclerae clear; dentition good; mucous membranes moist NECK:  No palpable masses in the thyroid bed; symmetric on extension; no palpable anterior or posterior cervical lymphadenopathy; no supraclavicular masses; no tenderness CHEST: clear to auscultation bilaterally without rales, rhonchi, or wheezes CARDIAC: regular rate and rhythm without significant murmur; peripheral pulses are full EXT:  non-tender without edema; no deformity; old access grafts in both upper extremities NEURO: no gross focal deficits; no sign of tremor   LABORATORY RESULTS: See [facility] (CHL-Epic) for most recent results  RADIOLOGY RESULTS: See [facility] (CHL-Epic) for most recent results  IMPRESSION: #1 secondary hyperparathyroidism,  symptomatic #2 end-stage renal disease  PLAN: I discussed the above findings at length with the patient. We reviewed his laboratory studies. We discussed the indications for parathyroidectomy. We discussed the procedure of total parathyroidectomy with autotransplantation. We discussed potential complications including recurrent laryngeal nerve injury. We discussed his hospital stay and his recovery and his return to work. We will make arrangements for his procedure in the near  future. We will coordinate this with the nephrologist.  The risks and benefits of the procedure have been discussed at length with the patient.  The patient understands the proposed procedure, potential alternative treatments, and the course of recovery to be expected.  All of the patient's questions have been answered at this time.  The patient wishes to proceed with surgery.  [name], MD, [facility]  Primary Care Physician: No PCP Per Patient

## 2014-04-16 ENCOUNTER — Encounter (HOSPITAL_COMMUNITY): Payer: Self-pay | Admitting: Pharmacy Technician

## 2014-04-16 ENCOUNTER — Other Ambulatory Visit (HOSPITAL_COMMUNITY): Payer: Self-pay | Admitting: *Deleted

## 2014-04-16 NOTE — Pre-Procedure Instructions (Signed)
[name]  04/16/2014   Your procedure is scheduled on:  Thursday, April 24, 2014 at 11:30 AM.   Report to [facility] Entrance "A" Admitting Office  at 9:30 AM.   Call this number if you have problems the morning of surgery: [phone]   Remember:   Do not eat food or drink liquids after midnight Wednesday, 04/23/14.   Take these medicines the morning of surgery with A SIP OF WATER:  Tylenol if needed, amlodipine(norvasc), clonidine(catapres) (STOP ASPIRIN, COUMADIN, PLAVIX, EFFIENT, HERBAL MEDICINES)   Do not wear jewelry.  Do not wear lotions, powders, or cologne. You may wear deodorant.   Men may shave face and neck.  Do not bring valuables to the hospital.  [facility] is not responsible                  for any belongings or valuables.               Contacts, dentures or bridgework may not be worn into surgery.  Leave suitcase in the car. After surgery it may be brought to your room.  For patients admitted to the hospital, discharge time is determined by your                treatment team.                 Special Instructions: [facility] - Preparing for Surgery  Before surgery, you can play an important role.  Because skin is not sterile, your skin needs to be as free of germs as possible.  You can reduce the number of germs on you skin by washing with CHG (chlorahexidine gluconate) soap before surgery.  CHG is an antiseptic cleaner which kills germs and bonds with the skin to continue killing germs even after washing.  Please DO NOT use if you have an allergy to CHG or antibacterial soaps.  If your skin becomes reddened/irritated stop using the CHG and inform your nurse when you arrive at Short Stay.  Do not shave (including legs and underarms) for at least 48 hours prior to the first CHG shower.  You may shave your face.  Please follow these instructions carefully:   1.  Shower with CHG Soap the night before surgery and the                                 morning of Surgery.  2.  If you choose to wash your hair, wash your hair first as usual with your       normal shampoo.  3.  After you shampoo, rinse your hair and body thoroughly to remove the                      Shampoo.  4.  Use CHG as you would any other liquid soap.  You can apply chg directly       to the skin and wash gently with scrungie or a clean washcloth.  5.  Apply the CHG Soap to your body ONLY FROM THE NECK DOWN.        Do not use on open wounds or open sores.  Avoid contact with your eyes, ears, mouth and genitals (private parts).  Wash genitals (private parts) with your normal soap.  6.  Wash thoroughly, paying special attention to the area where your surgery  will be performed.  7.  Thoroughly rinse your body with warm water from the neck down.  8.  DO NOT shower/wash with your normal soap after using and rinsing off       the CHG Soap.  9.  Pat yourself dry with a clean towel.            10.  Wear clean pajamas.            11.  Place clean sheets on your bed the night of your first shower and do not        sleep with pets.  Day of Surgery  Do not apply any lotions the morning of surgery.  Please wear clean clothes to the hospital/surgery center.     Please read over the following fact sheets that you were given: Pain Booklet, Coughing and Deep Breathing and Surgical Site Infection Prevention

## 2014-04-17 ENCOUNTER — Encounter (HOSPITAL_COMMUNITY)
Admission: RE | Admit: 2014-04-17 | Discharge: 2014-04-17 | Disposition: A | Discharge status: Home / Self Care | Payer: Medicare Other | Source: Ambulatory Visit | Attending: Surgery | Admitting: Surgery

## 2014-04-17 ENCOUNTER — Encounter (HOSPITAL_COMMUNITY): Payer: Self-pay

## 2014-04-17 ENCOUNTER — Ambulatory Visit (HOSPITAL_COMMUNITY)
Admission: RE | Admit: 2014-04-17 | Discharge: 2014-04-17 | Disposition: A | Discharge status: Home / Self Care | Payer: Medicare Other | Source: Ambulatory Visit | Attending: Anesthesiology | Admitting: Anesthesiology

## 2014-04-17 DIAGNOSIS — Z01818 Encounter for other preprocedural examination: Secondary | ICD-10-CM | POA: Insufficient documentation

## 2014-04-17 DIAGNOSIS — I10 Essential (primary) hypertension: Secondary | ICD-10-CM | POA: Insufficient documentation

## 2014-04-17 HISTORY — DX: Major depressive disorder, single episode, unspecified: F32.9

## 2014-04-17 HISTORY — DX: Other muscle spasm: M62.838

## 2014-04-17 HISTORY — DX: Headache: R51

## 2014-04-17 HISTORY — DX: Cough: R05

## 2014-04-17 HISTORY — DX: Cough, unspecified: R05.9

## 2014-04-17 HISTORY — DX: Depression, unspecified: F32.A

## 2014-04-17 LAB — CBC
HCT: 37.5 % — ABNORMAL LOW (ref 39.0–52.0)
Hemoglobin: 12 g/dL — ABNORMAL LOW (ref 13.0–17.0)
MCH: 30 pg (ref 26.0–34.0)
MCHC: 32 g/dL (ref 30.0–36.0)
MCV: 93.8 fL (ref 78.0–100.0)
PLATELETS: 203 10*3/uL (ref 150–400)
RBC: 4 MIL/uL — ABNORMAL LOW (ref 4.22–5.81)
RDW: 13 % (ref 11.5–15.5)
WBC: 4.9 10*3/uL (ref 4.0–10.5)

## 2014-04-17 LAB — BASIC METABOLIC PANEL
ANION GAP: 19 — AB (ref 5–15)
BUN: 27 mg/dL — ABNORMAL HIGH (ref 6–23)
CO2: 27 meq/L (ref 19–32)
CREATININE: 11.66 mg/dL — AB (ref 0.50–1.35)
Calcium: 10.2 mg/dL (ref 8.4–10.5)
Chloride: 97 mEq/L (ref 96–112)
GFR calc non Af Amer: 5 mL/min — ABNORMAL LOW (ref >90)
GFR, EST AFRICAN AMERICAN: 5 mL/min — AB (ref >90)
Glucose, Bld: 86 mg/dL (ref 70–99)
Potassium: 5 mEq/L (ref 3.7–5.3)
SODIUM: 143 meq/L (ref 137–147)

## 2014-04-17 NOTE — Progress Notes (Signed)
Quick Note:  Pre-operative chest x-ray is acceptable for scheduled surgery.  Akasha Melena M. Deysy Schabel, MD, FACS [facility] Office: [phone]   ______ 

## 2014-04-18 NOTE — Progress Notes (Signed)
Quick Note:  EKG is acceptable for scheduled surgery.  Harlon Kutner M. Bless Belshe, MD, FACS [facility] Office: [phone]   ______ 

## 2014-04-23 MED ORDER — CEFAZOLIN SODIUM-DEXTROSE 2-3 GM-% IV SOLR
2.0000 g | INTRAVENOUS | Status: AC
  Administered 2014-04-24: 2 g via INTRAVENOUS
  Filled 2014-04-23: qty 50

## 2014-04-24 ENCOUNTER — Inpatient Hospital Stay (HOSPITAL_COMMUNITY)
Admission: RE | Admit: 2014-04-24 | Discharge: 2014-04-26 | DRG: 674 | Disposition: A | Discharge status: Home / Self Care | Payer: Medicare Other | Source: Ambulatory Visit | Attending: Surgery | Admitting: Surgery

## 2014-04-24 ENCOUNTER — Inpatient Hospital Stay (HOSPITAL_COMMUNITY): Payer: Medicare Other | Admitting: Critical Care Medicine

## 2014-04-24 ENCOUNTER — Encounter (HOSPITAL_COMMUNITY)
Admission: RE | Disposition: A | Discharge status: Home / Self Care | Payer: Self-pay | Source: Ambulatory Visit | Attending: Surgery

## 2014-04-24 ENCOUNTER — Encounter (HOSPITAL_COMMUNITY): Payer: Self-pay | Admitting: *Deleted

## 2014-04-24 ENCOUNTER — Encounter (HOSPITAL_COMMUNITY): Payer: Medicare Other | Admitting: Critical Care Medicine

## 2014-04-24 DIAGNOSIS — F3289 Other specified depressive episodes: Secondary | ICD-10-CM | POA: Diagnosis present

## 2014-04-24 DIAGNOSIS — I120 Hypertensive chronic kidney disease with stage 5 chronic kidney disease or end stage renal disease: Secondary | ICD-10-CM | POA: Diagnosis present

## 2014-04-24 DIAGNOSIS — N2581 Secondary hyperparathyroidism of renal origin: Secondary | ICD-10-CM | POA: Diagnosis present

## 2014-04-24 DIAGNOSIS — E875 Hyperkalemia: Secondary | ICD-10-CM | POA: Diagnosis present

## 2014-04-24 DIAGNOSIS — F329 Major depressive disorder, single episode, unspecified: Secondary | ICD-10-CM | POA: Diagnosis present

## 2014-04-24 DIAGNOSIS — N186 End stage renal disease: Secondary | ICD-10-CM

## 2014-04-24 DIAGNOSIS — D649 Anemia, unspecified: Secondary | ICD-10-CM | POA: Diagnosis present

## 2014-04-24 DIAGNOSIS — Z992 Dependence on renal dialysis: Secondary | ICD-10-CM | POA: Diagnosis not present

## 2014-04-24 HISTORY — PX: PARATHYROIDECTOMY: SHX19

## 2014-04-24 HISTORY — DX: End stage renal disease: N18.6

## 2014-04-24 HISTORY — DX: End stage renal disease: Z99.2

## 2014-04-24 LAB — COMPREHENSIVE METABOLIC PANEL
ALBUMIN: 3.5 g/dL (ref 3.5–5.2)
ALK PHOS: 90 U/L (ref 39–117)
ALT: 10 U/L (ref 0–53)
AST: 25 U/L (ref 0–37)
Anion gap: 24 — ABNORMAL HIGH (ref 5–15)
BUN: 34 mg/dL — ABNORMAL HIGH (ref 6–23)
CO2: 16 meq/L — AB (ref 19–32)
Calcium: 9.2 mg/dL (ref 8.4–10.5)
Chloride: 102 mEq/L (ref 96–112)
Creatinine, Ser: 11.53 mg/dL — ABNORMAL HIGH (ref 0.50–1.35)
GFR calc Af Amer: 6 mL/min — ABNORMAL LOW (ref >90)
GFR, EST NON AFRICAN AMERICAN: 5 mL/min — AB (ref >90)
Glucose, Bld: 84 mg/dL (ref 70–99)
POTASSIUM: 6.7 meq/L — AB (ref 3.7–5.3)
Sodium: 142 mEq/L (ref 137–147)
Total Bilirubin: 0.2 mg/dL — ABNORMAL LOW (ref 0.3–1.2)
Total Protein: 7.6 g/dL (ref 6.0–8.3)

## 2014-04-24 LAB — CBC
HEMATOCRIT: 37.6 % — AB (ref 39.0–52.0)
HEMOGLOBIN: 12 g/dL — AB (ref 13.0–17.0)
MCH: 30.8 pg (ref 26.0–34.0)
MCHC: 31.9 g/dL (ref 30.0–36.0)
MCV: 96.7 fL (ref 78.0–100.0)
Platelets: 194 10*3/uL (ref 150–400)
RBC: 3.89 MIL/uL — AB (ref 4.22–5.81)
RDW: 13.1 % (ref 11.5–15.5)
WBC: 5.2 10*3/uL (ref 4.0–10.5)

## 2014-04-24 LAB — BASIC METABOLIC PANEL
Anion gap: 19 — ABNORMAL HIGH (ref 5–15)
Anion gap: 17 — ABNORMAL HIGH (ref 5–15)
BUN: 31 mg/dL — ABNORMAL HIGH (ref 6–23)
BUN: 34 mg/dL — ABNORMAL HIGH (ref 6–23)
CO2: 24 meq/L (ref 19–32)
CO2: 25 mEq/L (ref 19–32)
Calcium: 10.3 mg/dL (ref 8.4–10.5)
Calcium: 8.8 mg/dL (ref 8.4–10.5)
Chloride: 97 mEq/L (ref 96–112)
Chloride: 100 mEq/L (ref 96–112)
Creatinine, Ser: 10.73 mg/dL — ABNORMAL HIGH (ref 0.50–1.35)
Creatinine, Ser: 11.88 mg/dL — ABNORMAL HIGH (ref 0.50–1.35)
GFR calc Af Amer: 6 mL/min — ABNORMAL LOW (ref >90)
GFR calc Af Amer: 5 mL/min — ABNORMAL LOW (ref >90)
GFR calc non Af Amer: 5 mL/min — ABNORMAL LOW (ref >90)
GFR calc non Af Amer: 5 mL/min — ABNORMAL LOW (ref >90)
Glucose, Bld: 78 mg/dL (ref 70–99)
Glucose, Bld: 90 mg/dL (ref 70–99)
POTASSIUM: 4.9 meq/L (ref 3.7–5.3)
Potassium: 4.8 mEq/L (ref 3.7–5.3)
SODIUM: 140 meq/L (ref 137–147)
Sodium: 142 mEq/L (ref 137–147)

## 2014-04-24 LAB — CALCIUM: CALCIUM: 9 mg/dL (ref 8.4–10.5)

## 2014-04-24 LAB — POCT I-STAT 4, (NA,K, GLUC, HGB,HCT)
GLUCOSE: 82 mg/dL (ref 70–99)
HEMATOCRIT: 38 % — AB (ref 39.0–52.0)
HEMOGLOBIN: 12.9 g/dL — AB (ref 13.0–17.0)
POTASSIUM: 4.7 meq/L (ref 3.7–5.3)
SODIUM: 140 meq/L (ref 137–147)

## 2014-04-24 SURGERY — PARATHYROIDECTOMY, WITH AUTOGRAFT TRANSPLANT
Anesthesia: General

## 2014-04-24 MED ORDER — SODIUM CHLORIDE 0.9 % IV SOLN: 100.0000 mL | INTRAVENOUS | Status: DC | PRN

## 2014-04-24 MED ORDER — NEOSTIGMINE METHYLSULFATE 10 MG/10ML IV SOLN
INTRAVENOUS | Status: DC | PRN
  Administered 2014-04-24: 4 mg via INTRAVENOUS

## 2014-04-24 MED ORDER — MIDAZOLAM HCL 5 MG/5ML IJ SOLN
INTRAMUSCULAR | Status: DC | PRN
  Administered 2014-04-24 (×2): 1 mg via INTRAVENOUS

## 2014-04-24 MED ORDER — LIDOCAINE-PRILOCAINE 2.5-2.5 % EX CREA: 1.0000 "application " | TOPICAL_CREAM | CUTANEOUS | Status: DC | PRN

## 2014-04-24 MED ORDER — ONDANSETRON HCL 4 MG PO TABS: 4.0000 mg | ORAL_TABLET | Freq: Four times a day (QID) | ORAL | Status: DC | PRN

## 2014-04-24 MED ORDER — 0.9 % SODIUM CHLORIDE (POUR BTL) OPTIME
TOPICAL | Status: DC | PRN
  Administered 2014-04-24 (×3): 1000 mL

## 2014-04-24 MED ORDER — HYDROMORPHONE HCL PF 1 MG/ML IJ SOLN
0.2500 mg | INTRAMUSCULAR | Status: DC | PRN
  Administered 2014-04-24 (×4): 0.5 mg via INTRAVENOUS

## 2014-04-24 MED ORDER — ONDANSETRON HCL 4 MG/2ML IJ SOLN: 4.0000 mg | Freq: Once | INTRAMUSCULAR | Status: DC | PRN

## 2014-04-24 MED ORDER — LABETALOL HCL 5 MG/ML IV SOLN
INTRAVENOUS | Status: DC | PRN
  Administered 2014-04-24: 2.5 mg via INTRAVENOUS

## 2014-04-24 MED ORDER — ONDANSETRON HCL 4 MG/2ML IJ SOLN: 4.0000 mg | Freq: Four times a day (QID) | INTRAMUSCULAR | Status: DC | PRN

## 2014-04-24 MED ORDER — ROCURONIUM BROMIDE 50 MG/5ML IV SOLN
INTRAVENOUS | Status: AC
  Filled 2014-04-24: qty 1

## 2014-04-24 MED ORDER — DOXERCALCIFEROL 4 MCG/2ML IV SOLN
8.0000 ug | INTRAVENOUS | Status: DC
  Filled 2014-04-24: qty 4

## 2014-04-24 MED ORDER — HYDROMORPHONE HCL PF 1 MG/ML IJ SOLN
1.0000 mg | INTRAMUSCULAR | Status: DC | PRN
  Administered 2014-04-24 – 2014-04-25 (×2): 1 mg via INTRAVENOUS
  Filled 2014-04-24 (×2): qty 1

## 2014-04-24 MED ORDER — FENTANYL CITRATE 0.05 MG/ML IJ SOLN
INTRAMUSCULAR | Status: AC
  Filled 2014-04-24: qty 5

## 2014-04-24 MED ORDER — GLYCOPYRROLATE 0.2 MG/ML IJ SOLN
INTRAMUSCULAR | Status: DC | PRN
  Administered 2014-04-24: .5 mg via INTRAVENOUS

## 2014-04-24 MED ORDER — ALTEPLASE 2 MG IJ SOLR
2.0000 mg | Freq: Once | INTRAMUSCULAR | Status: AC | PRN
  Filled 2014-04-24: qty 2

## 2014-04-24 MED ORDER — LIDOCAINE HCL (CARDIAC) 20 MG/ML IV SOLN
INTRAVENOUS | Status: AC
  Filled 2014-04-24: qty 5

## 2014-04-24 MED ORDER — HYDROCODONE-ACETAMINOPHEN 5-325 MG PO TABS
1.0000 | ORAL_TABLET | ORAL | Status: DC | PRN
  Administered 2014-04-24: 2 via ORAL

## 2014-04-24 MED ORDER — CLONIDINE HCL 0.2 MG PO TABS: 0.2000 mg | ORAL_TABLET | Freq: Every day | ORAL | Status: DC

## 2014-04-24 MED ORDER — HEPARIN SODIUM (PORCINE) 1000 UNIT/ML DIALYSIS: 1000.0000 [IU] | INTRAMUSCULAR | Status: DC | PRN

## 2014-04-24 MED ORDER — PENTAFLUOROPROP-TETRAFLUOROETH EX AERO: 1.0000 "application " | INHALATION_SPRAY | CUTANEOUS | Status: DC | PRN

## 2014-04-24 MED ORDER — CALCIUM ACETATE 667 MG PO CAPS
2001.0000 mg | ORAL_CAPSULE | Freq: Three times a day (TID) | ORAL | Status: DC
  Administered 2014-04-25 – 2014-04-26 (×4): 2001 mg via ORAL
  Filled 2014-04-24 (×8): qty 3

## 2014-04-24 MED ORDER — HYDROMORPHONE HCL PF 1 MG/ML IJ SOLN
INTRAMUSCULAR | Status: AC
  Administered 2014-04-24: 0.5 mg via INTRAVENOUS
  Filled 2014-04-24: qty 1

## 2014-04-24 MED ORDER — SODIUM CHLORIDE 0.9 % IV SOLN
10.0000 mg | INTRAVENOUS | Status: DC | PRN
  Administered 2014-04-24: 5 ug/min via INTRAVENOUS

## 2014-04-24 MED ORDER — ROCURONIUM BROMIDE 100 MG/10ML IV SOLN
INTRAVENOUS | Status: DC | PRN
  Administered 2014-04-24: 10 mg via INTRAVENOUS
  Administered 2014-04-24: 5 mg via INTRAVENOUS
  Administered 2014-04-24: 10 mg via INTRAVENOUS
  Administered 2014-04-24: 25 mg via INTRAVENOUS

## 2014-04-24 MED ORDER — CALCIUM CARBONATE ANTACID 500 MG PO CHEW
2.0000 | CHEWABLE_TABLET | Freq: Three times a day (TID) | ORAL | Status: DC
  Administered 2014-04-24 – 2014-04-26 (×6): 400 mg via ORAL
  Filled 2014-04-24 (×9): qty 2

## 2014-04-24 MED ORDER — GLYCOPYRROLATE 0.2 MG/ML IJ SOLN
INTRAMUSCULAR | Status: AC
  Filled 2014-04-24: qty 3

## 2014-04-24 MED ORDER — MIDAZOLAM HCL 2 MG/2ML IJ SOLN
INTRAMUSCULAR | Status: AC
  Filled 2014-04-24: qty 2

## 2014-04-24 MED ORDER — FENTANYL CITRATE 0.05 MG/ML IJ SOLN
INTRAMUSCULAR | Status: DC | PRN
  Administered 2014-04-24 (×3): 100 ug via INTRAVENOUS
  Administered 2014-04-24 (×3): 50 ug via INTRAVENOUS
  Administered 2014-04-24: 100 ug via INTRAVENOUS
  Administered 2014-04-24: 50 ug via INTRAVENOUS

## 2014-04-24 MED ORDER — ONDANSETRON HCL 4 MG/2ML IJ SOLN
INTRAMUSCULAR | Status: DC | PRN
  Administered 2014-04-24: 4 mg via INTRAVENOUS

## 2014-04-24 MED ORDER — DIPHENHYDRAMINE HCL 25 MG PO CAPS
25.0000 mg | ORAL_CAPSULE | Freq: Four times a day (QID) | ORAL | Status: DC | PRN
  Administered 2014-04-24: 25 mg via ORAL
  Filled 2014-04-24: qty 1

## 2014-04-24 MED ORDER — AMLODIPINE BESYLATE 10 MG PO TABS: 10.0000 mg | ORAL_TABLET | Freq: Every day | ORAL | Status: DC

## 2014-04-24 MED ORDER — CLONIDINE HCL 0.2 MG PO TABS
0.2000 mg | ORAL_TABLET | Freq: Three times a day (TID) | ORAL | Status: DC
  Administered 2014-04-24 – 2014-04-26 (×5): 0.2 mg via ORAL
  Filled 2014-04-24 (×9): qty 1

## 2014-04-24 MED ORDER — NEOSTIGMINE METHYLSULFATE 10 MG/10ML IV SOLN
INTRAVENOUS | Status: AC
  Filled 2014-04-24: qty 1

## 2014-04-24 MED ORDER — AMLODIPINE BESYLATE 10 MG PO TABS
10.0000 mg | ORAL_TABLET | Freq: Every day | ORAL | Status: DC
  Administered 2014-04-24 – 2014-04-25 (×2): 10 mg via ORAL
  Filled 2014-04-24 (×3): qty 1

## 2014-04-24 MED ORDER — DOXERCALCIFEROL 4 MCG/2ML IV SOLN: 4.0000 ug | INTRAVENOUS | Status: DC

## 2014-04-24 MED ORDER — SODIUM CHLORIDE 0.9 % IV SOLN
INTRAVENOUS | Status: DC
  Administered 2014-04-24: 10 mL/h via INTRAVENOUS
  Administered 2014-04-24: 14:00:00 via INTRAVENOUS
  Administered 2014-04-26: 10 mL/h via INTRAVENOUS

## 2014-04-24 MED ORDER — ONDANSETRON HCL 4 MG/2ML IJ SOLN
INTRAMUSCULAR | Status: AC
  Filled 2014-04-24: qty 2

## 2014-04-24 MED ORDER — SODIUM CHLORIDE 0.9 % IV SOLN: INTRAVENOUS | Status: DC

## 2014-04-24 MED ORDER — LIDOCAINE HCL (PF) 1 % IJ SOLN: 5.0000 mL | INTRAMUSCULAR | Status: DC | PRN

## 2014-04-24 MED ORDER — DOXERCALCIFEROL 4 MCG/2ML IV SOLN: 6.0000 ug | INTRAVENOUS | Status: DC

## 2014-04-24 MED ORDER — PROPOFOL 10 MG/ML IV BOLUS
INTRAVENOUS | Status: AC
  Filled 2014-04-24: qty 20

## 2014-04-24 MED ORDER — NEPRO/CARBSTEADY PO LIQD: 237.0000 mL | ORAL | Status: DC | PRN

## 2014-04-24 MED ORDER — CALCIUM CARBONATE ANTACID 500 MG PO CHEW: 400.0000 mg | CHEWABLE_TABLET | Freq: Once | ORAL | Status: DC

## 2014-04-24 MED ORDER — HYDROCODONE-ACETAMINOPHEN 5-325 MG PO TABS
ORAL_TABLET | ORAL | Status: AC
  Administered 2014-04-24: 2 via ORAL
  Filled 2014-04-24: qty 2

## 2014-04-24 MED ORDER — HEMOSTATIC AGENTS (NO CHARGE) OPTIME
TOPICAL | Status: DC | PRN
  Administered 2014-04-24: 1

## 2014-04-24 MED ORDER — SEVELAMER CARBONATE 2.4 G PO PACK
2.4000 g | PACK | Freq: Three times a day (TID) | ORAL | Status: DC
Start: 2014-04-24 — End: 2014-04-24

## 2014-04-24 MED ORDER — LIDOCAINE HCL (CARDIAC) 20 MG/ML IV SOLN
INTRAVENOUS | Status: DC | PRN
  Administered 2014-04-24: 160 mg via INTRAVENOUS

## 2014-04-24 MED ORDER — PROPOFOL 10 MG/ML IV BOLUS
INTRAVENOUS | Status: DC | PRN
  Administered 2014-04-24: 200 mg via INTRAVENOUS

## 2014-04-24 MED ORDER — HYDROMORPHONE HCL PF 1 MG/ML IJ SOLN
INTRAMUSCULAR | Status: AC
  Filled 2014-04-24: qty 1

## 2014-04-24 SURGICAL SUPPLY — 62 items
APL SKNCLS STERI-STRIP NONHPOA (GAUZE/BANDAGES/DRESSINGS) ×2
BANDAGE ELASTIC 4 VELCRO ST LF (GAUZE/BANDAGES/DRESSINGS) ×3 IMPLANT
BANDAGE GAUZE ELAST BULKY 4 IN (GAUZE/BANDAGES/DRESSINGS) ×1 IMPLANT
BENZOIN TINCTURE PRP APPL 2/3 (GAUZE/BANDAGES/DRESSINGS) ×6 IMPLANT
BLADE SURG 15 STRL LF DISP TIS (BLADE) IMPLANT
BLADE SURG 15 STRL SS (BLADE) ×9
BLADE SURG ROTATE 9660 (MISCELLANEOUS) IMPLANT
BNDG GAUZE ELAST 4 BULKY (GAUZE/BANDAGES/DRESSINGS) ×2 IMPLANT
CANISTER SUCTION 2500CC (MISCELLANEOUS) ×3 IMPLANT
CHLORAPREP W/TINT 10.5 ML (MISCELLANEOUS) ×6 IMPLANT
CLIP TI MEDIUM 24 (CLIP) ×3 IMPLANT
CLIP TI WIDE RED SMALL 24 (CLIP) ×3 IMPLANT
CLOSURE WOUND 1/2 X4 (GAUZE/BANDAGES/DRESSINGS) ×2
CONT SPEC 4OZ CLIKSEAL STRL BL (MISCELLANEOUS) ×24 IMPLANT
COVER SURGICAL LIGHT HANDLE (MISCELLANEOUS) ×3 IMPLANT
CRADLE DONUT ADULT HEAD (MISCELLANEOUS) ×3 IMPLANT
DRAPE PED LAPAROTOMY (DRAPES) ×6 IMPLANT
DRAPE SLUSH MACHINE 52X66 (DRAPES) ×2 IMPLANT
DRAPE SLUSH/WARMER DISC (DRAPES) IMPLANT
DRAPE UTILITY 15X26 W/TAPE STR (DRAPE) ×12 IMPLANT
ELECT CAUTERY BLADE 6.4 (BLADE) ×3 IMPLANT
ELECT REM PT RETURN 9FT ADLT (ELECTROSURGICAL) ×3
ELECTRODE REM PT RTRN 9FT ADLT (ELECTROSURGICAL) ×1 IMPLANT
GAUZE SPONGE 4X4 16PLY XRAY LF (GAUZE/BANDAGES/DRESSINGS) ×3 IMPLANT
GLOVE BIO SURGEON STRL SZ7.5 (GLOVE) ×4 IMPLANT
GLOVE BIOGEL PI IND STRL 6.5 (GLOVE) IMPLANT
GLOVE BIOGEL PI IND STRL 7.0 (GLOVE) IMPLANT
GLOVE BIOGEL PI IND STRL 7.5 (GLOVE) IMPLANT
GLOVE BIOGEL PI INDICATOR 6.5 (GLOVE) ×4
GLOVE BIOGEL PI INDICATOR 7.0 (GLOVE) ×4
GLOVE BIOGEL PI INDICATOR 7.5 (GLOVE) ×4
GLOVE SURG ORTHO 8.0 STRL STRW (GLOVE) ×6 IMPLANT
GLOVE SURG SS PI 7.0 STRL IVOR (GLOVE) ×4 IMPLANT
GOWN STRL REUS W/ TWL LRG LVL3 (GOWN DISPOSABLE) ×2 IMPLANT
GOWN STRL REUS W/ TWL XL LVL3 (GOWN DISPOSABLE) ×1 IMPLANT
GOWN STRL REUS W/TWL LRG LVL3 (GOWN DISPOSABLE) ×12
GOWN STRL REUS W/TWL XL LVL3 (GOWN DISPOSABLE) ×6
HEMOSTAT SURGICEL 2X4 FIBR (HEMOSTASIS) ×3 IMPLANT
KIT BASIN OR (CUSTOM PROCEDURE TRAY) ×3 IMPLANT
KIT ROOM TURNOVER OR (KITS) ×3 IMPLANT
NS IRRIG 1000ML POUR BTL (IV SOLUTION) ×7 IMPLANT
PACK SURGICAL SETUP 50X90 (CUSTOM PROCEDURE TRAY) ×3 IMPLANT
PAD ARMBOARD 7.5X6 YLW CONV (MISCELLANEOUS) ×5 IMPLANT
PENCIL BUTTON HOLSTER BLD 10FT (ELECTRODE) ×3 IMPLANT
SPECIMEN JAR SMALL (MISCELLANEOUS) ×12 IMPLANT
SPONGE GAUZE 4X4 12PLY (GAUZE/BANDAGES/DRESSINGS) ×3 IMPLANT
SPONGE INTESTINAL PEANUT (DISPOSABLE) ×3 IMPLANT
STRIP CLOSURE SKIN 1/2X4 (GAUZE/BANDAGES/DRESSINGS) ×4 IMPLANT
SUT MNCRL AB 4-0 PS2 18 (SUTURE) ×6 IMPLANT
SUT PROLENE 4 0 RB 1 (SUTURE) ×3
SUT PROLENE 4-0 RB1 .5 CRCL 36 (SUTURE) ×1 IMPLANT
SUT SILK 2 0 (SUTURE) ×3
SUT SILK 2-0 18XBRD TIE 12 (SUTURE) ×1 IMPLANT
SUT VIC AB 3-0 SH 18 (SUTURE) ×6 IMPLANT
SYR BULB 3OZ (MISCELLANEOUS) ×3 IMPLANT
TAPE CLOTH SURG 6X10 WHT LF (GAUZE/BANDAGES/DRESSINGS) ×2 IMPLANT
TOWEL OR 17X24 6PK STRL BLUE (TOWEL DISPOSABLE) ×3 IMPLANT
TOWEL OR 17X26 10 PK STRL BLUE (TOWEL DISPOSABLE) ×3 IMPLANT
TUBE CONNECTING 12'X1/4 (SUCTIONS) ×1
TUBE CONNECTING 12X1/4 (SUCTIONS) ×2 IMPLANT
UNDERPAD 30X30 INCONTINENT (UNDERPADS AND DIAPERS) ×3 IMPLANT
WATER STERILE IRR 1000ML POUR (IV SOLUTION) IMPLANT

## 2014-04-24 NOTE — Anesthesia Procedure Notes (Signed)
Procedure Name: Intubation Date/Time: 04/24/2014 12:23 PM Performed by: Octavio Graves Pre-anesthesia Checklist: Patient identified, Timeout performed, Emergency Drugs available, Suction available and Patient being monitored Patient Re-evaluated:Patient Re-evaluated prior to inductionOxygen Delivery Method: Circle system utilized Preoxygenation: Pre-oxygenation with 100% oxygen Intubation Type: IV induction Ventilation: Mask ventilation without difficulty Laryngoscope Size: Miller and 2 Grade View: Grade I Tube type: Oral Tube size: 7.5 mm Number of attempts: 1 Airway Equipment and Method: Stylet Placement Confirmation: ETT inserted through vocal cords under direct vision,  breath sounds checked- equal and bilateral and positive ETCO2 Secured at: 23 cm Tube secured with: Tape Dental Injury: Teeth and Oropharynx as per pre-operative assessment  Comments: IV induction Smith- intubation AM CRNA atraumatic teeth and mouth as preop- + ETCO2 + BS Bilat

## 2014-04-24 NOTE — Op Note (Signed)
NAMELEBERT, VUKOVICH NO.:  [id number]  MEDICAL RECORD NO.:  [id number]  LOCATION:  6E08C                        FACILITY:  [facility]  PHYSICIAN:  [name], MD      DATE OF BIRTH:  Oct 10, 1970  DATE OF PROCEDURE: 24 April 2014                               OPERATIVE REPORT   PREOPERATIVE DIAGNOSIS:  Secondary hyperparathyroidism, end-stage renal disease.  POSTOPERATIVE DIAGNOSIS:  Secondary hyperparathyroidism, end-stage renal disease.  PROCEDURE: 1. Total parathyroidectomy. 2. Autotransplantation parathyroid tissue to left brachioradialis     muscle.  SURGEON:  [name], MD, FACS  ANESTHESIA:  General per Sharolyn Douglas, MD  ESTIMATED BLOOD LOSS:  Minimal.  PREPARATION:  ChloraPrep.  COMPLICATIONS:  None.  INDICATIONS:  The patient is a 43 year old male referred by his nephrologist for total parathyroidectomy with autotransplantation for management of secondary hyperparathyroidism.  BODY OF REPORT:  Procedure was done in OR #8 at the [facility].  The patient was brought to the operating room, placed in a supine position on the operating room table.  Following administration of general anesthesia, the patient was positioned and then prepped and draped in the usual aseptic fashion.  After ascertaining that an adequate level of anesthesia had been achieved, a Kocher incision was made with #15 blade.  Dissection was carried through subcutaneous tissues.  Platysma was divided with the electrocautery.  Subplatysmal flaps were elevated cephalad and caudad from the thyroid notch to the sternal notch.  The Mahorner self-retaining retractor was placed for exposure.  Strap muscles were incised in the midline.  Dissection was begun on the left side of the neck.  Left thyroid lobe was gently mobilized.  Venous tributaries were divided between Ligaclips. Palpation reveals an enlarged inferior parathyroid gland on the left side.  This  was gently dissected out.  It is mobilized.  There is surrounding adipose tissue.  There appears to be an adjacent second parathyroid gland immediately cephalad.  This was also dissected out and the 2 glands were excised together.  Vascular tributaries were divided between Ligaclips.  The entire mass was excised and then separated on the back table using sharp dissection.  There appeared to be 2 discrete glands.  Biopsies of each were taken and submitted to Pathology labeled as left inferior parathyroid and left superior parathyroid.  Both biopsies were positive for hypercellular parathyroid tissue.  The remaining glands are placed in iced saline on the back table.  Good hemostasis was achieved in the left neck.  A dry pack was placed.  Next, we turned our attention to the right thyroid lobe.  Strap muscles were again reflected laterally and the right thyroid lobe mobilized. Venous tributaries were divided between small Ligaclips.  Gland was rolled anteriorly and exploration reveals an enlarged parathyroid gland in the right inferior position.  This was gently dissected out. Vascular tributaries were divided between Ligaclips and the gland was excised in its entirety.  It was the largest of the 4 glands that we found.  A fragment of tissue was submitted to pathology labeled as right inferior parathyroid gland.  It returns as hypercellular parathyroid tissue.  Remainder of the gland was placed in iced saline on the back table.  Further dissection reveals an abnormal parathyroid gland located immediately posterior to the inferior thyroid artery, and extending behind the esophagus.  It was gently dissected out away from the vascular structures.  Care was taken to avoid injury to the recurrent laryngeal nerve, which was nearby.  The entire gland is mobilized and then its vascular tributary divided between small Ligaclips.  The gland was excised.  A fragment of tissue was submitted for  frozen section which confirmed hypercellular parathyroid tissue.  Remainder of the gland was placed in saline on the back table.  Neck was irrigated bilaterally with warm saline and good hemostasis achieved.  Fibrillar was placed throughout the operative field.  Strap muscles are reapproximated in the midline with interrupted 3-0 Vicryl sutures.  Platysma was closed with interrupted 3-0 Vicryl sutures.  Skin was closed with a running 4-0 Monocryl subcuticular suture.  Wound was washed and dried and benzoin and Steri-Strips were applied.  Next, the left forearm was placed on an armboard and extended at the side.  The area of the brachioradialis muscle was prepped and draped in the usual aseptic fashion.  Using a #15 blade, a 5-cm incision was made over the brachioradialis muscle.  Dissection was carried through subcutaneous tissues.  Skin flaps were developed circumferentially and Weitlaner retractor placed for exposure.  The left superior gland was selected.  It was cut into 1 mm fragments. Eight of these fragments were then selected for implantation.  Using a #15 blade, an incision was made in the left brachioradialis muscle fascia.  Using a mosquito hemostat, a muscular cavity was created and a 1 mm fragment of parathyroid tissue was inserted.  The overlying muscle fascia was closed with interrupted 4-0 Prolene simple sutures. This exercise was repeated 8 times.  Good hemostasis was noted. Subcutaneous tissues were closed with interrupted 3-0 Vicryl sutures. Skin was closed with a running 4-0 Monocryl subcuticular suture.  Wound was washed and dried and benzoin and Steri-Strips were applied.  Sterile dressings were applied.  The patient was awakened from anesthesia and brought to the recovery room.  The patient tolerated the procedure well.   [name], MD, [facility] Office: [phone]    TMG/MEDQ  D:  04/24/2014   T:  04/24/2014  Job:  [id number]  cc:   Elzie Rings. Lorrene Reid, M.D.

## 2014-04-24 NOTE — Anesthesia Preprocedure Evaluation (Addendum)
Anesthesia Evaluation  Patient identified by MRN, date of birth, ID band Patient awake    Reviewed: Allergy & Precautions, H&P , NPO status , Patient's Chart, lab work & pertinent test results  Airway       Dental  (+) Dental Advisory Given, Chipped   Pulmonary          Cardiovascular hypertension,     Neuro/Psych  Headaches, Depression  Neuromuscular disease    GI/Hepatic   Endo/Other    Renal/GU ESRF and DialysisRenal disease     Musculoskeletal   Abdominal   Peds  Hematology  (+) anemia ,   Anesthesia Other Findings   Reproductive/Obstetrics                          Anesthesia Physical Anesthesia Plan  ASA: III  Anesthesia Plan: General   Post-op Pain Management:    Induction: Intravenous  Airway Management Planned: Oral ETT  Additional Equipment:   Intra-op Plan:   Post-operative Plan: Extubation in OR  Informed Consent: I have reviewed the patients History and Physical, chart, labs and discussed the procedure including the risks, benefits and alternatives for the proposed anesthesia with the patient or authorized representative who has indicated his/her understanding and acceptance.     Plan Discussed with: CRNA, Anesthesiologist and Surgeon  Anesthesia Plan Comments:         Anesthesia Quick Evaluation

## 2014-04-24 NOTE — Consult Note (Signed)
Indication for Consultation:  Management of ESRD/hemodialysis; anemia, hypertension/volume and secondary hyperparathyroidism  HPI: [name] is a 43 y.o. male admitted for total parathyroidectomy with autotransplantation for management of secondary hyperparathyroidism after he failed sensipar. He receives HD MWF @ [facility]. Recent labs from 7/22  PTH 1017, calcium 9.7, and phosphorus 8.9. Will check Ca+ in PACU and CMP today. Will schedule HD tomorrow.      Past Medical History  Diagnosis Date  . Renal disorder     HD  . Hypertension   . Anemia   . Thyroid disease   . Cough     DRY    . Headache(784.0)   . Muscle spasms of neck     BACK, NECK  . Depression    Past Surgical History  Procedure Laterality Date  . Hemodialysis graft to left arm and left leg     History reviewed. No pertinent family history. Social History:  reports that he has never smoked. He does not have any smokeless tobacco history on file. He reports that he does not drink alcohol or use illicit drugs. No Known Allergies Prior to Admission medications   Medication Sig Start Date End Date Taking? Authorizing Provider  acetaminophen (TYLENOL) 500 MG tablet Take 1,000 mg by mouth every 6 (six) hours as needed for pain. For pain   Yes Historical Provider, MD  amLODipine (NORVASC) 10 MG tablet Take 10 mg by mouth daily.   Yes Historical Provider, MD  cloNIDine (CATAPRES) 0.2 MG tablet Take 0.2 mg by mouth daily.    Yes Historical Provider, MD  Multiple Vitamins-Minerals (MULTIVITAMIN WITH MINERALS) tablet Take 1 tablet by mouth daily.   Yes Historical Provider, MD  sevelamer carbonate (RENVELA) 800 MG tablet Take 800 mg by mouth 3 (three) times daily with meals.   Yes Historical Provider, MD   Current Facility-Administered Medications  Medication Dose Route Frequency Provider Last Rate Last Dose  . 0.9 %  sodium chloride infusion   Intravenous Continuous Kate Sable, MD 10 mL/hr at 04/24/14 1022    .  HYDROmorphone (DILAUDID) injection 0.25-0.5 mg  0.25-0.5 mg Intravenous Q5 min PRN Kate Sable, MD   0.5 mg at 04/24/14 1539  . ondansetron (ZOFRAN) injection 4 mg  4 mg Intravenous Once PRN Kate Sable, MD       Labs: Basic Metabolic Panel:  Recent Labs Lab 04/24/14 0958 04/24/14 1023 04/24/14 1024  NA 139 140 140  K 6.3* 4.9 4.7  CL  --  97  --   CO2  --  24  --   GLUCOSE 79 78 82  BUN  --  31*  --   CREATININE  --  10.73*  --   CALCIUM  --  10.3  --    Liver Function Tests: No results found for this basename: AST, ALT, ALKPHOS, BILITOT, PROT, ALBUMIN,  in the last 168 hours No results found for this basename: LIPASE, AMYLASE,  in the last 168 hours No results found for this basename: AMMONIA,  in the last 168 hours CBC:  Recent Labs Lab 04/24/14 0958 04/24/14 1023 04/24/14 1024  WBC  --  5.2  --   HGB 5.1* 12.0* 12.9*  HCT 15.0* 37.6* 38.0*  MCV  --  96.7  --   PLT  --  194  --    Cardiac Enzymes: No results found for this basename: CKTOTAL, CKMB, CKMBINDEX, TROPONINI,  in the last 168 hours CBG: No results found for this basename: GLUCAP,  in  the last 168 hours Iron Studies: No results found for this basename: IRON, TIBC, TRANSFERRIN, FERRITIN,  in the last 72 hours Studies/Results: No results found.   Review of Systems: Unable to obtain. Pt drowsy in PACU. Reports feeling well except post-op pain.   Physical Exam: Filed Vitals:   04/24/14 0936 04/24/14 0942 04/24/14 1511  BP: 148/98  156/66  Pulse: 68  72  Temp: 97.2 F (36.2 C) 97.2 F (36.2 C) 97.8 F (36.6 C)  TempSrc: Oral Oral   Resp: 18  16  Weight: 71.215 kg (157 lb)    SpO2: 100%  99%     General: Well developed, well nourished, slightly uncomfortable but in no acute distress. Drowsy but arousable.  Head: Normocephalic, atraumatic, sclera non-icteric, mucus membranes are moist Neck: Supple. JVD not elevated. Anterior neck post op dressing dry and intact. Lungs: Clear bilaterally to  auscultation without wheezes, rales, or rhonchi. Breathing is unlabored. Heart: RRR with S1 S2. No murmurs, rubs, or gallops appreciated. Abdomen: Soft, non-tender, non-distended with normoactive bowel sounds. No rebound/guarding. No obvious abdominal masses. M-S:  Strength and tone appear normal for age. Extremities: Left forearm post op dressing intact, +radial pulse without edema or ischemic changes, no open wounds  Neuro: Alert and oriented X 3. Moves all extremities spontaneously. Psych:  Responds to questions appropriately with a normal affect. Dialysis Access:  L thigh AVG +bruit/thrill  Dialysis Orders:  MWF @ [facility] 70 kgs  3hr 58min  2K/2Ca+ 1500 Heparin L thigh AVG 400/1.5 hectorol 2 mcg IV/HD No ESA or Fe  Assessment/Plan: 1.  total parathyroidectomy with autotransplantation/MBD- Ca+ 10.3 preop  PACU Ca+ pending. CMP tonight and in AM. Added Ca+ bath, will inc hectorol dose, renvela at home, change to phoslo here. Add Tums- adjust prn  2.  ESRD -  MWF @ [facility], HD tomorrow 3.  Hypertension/volume  - 156/66, no volume excess. Cont norvasc and clonidine.  4.  Anemia  - hgb 12.9, watch CBC. No ESA or Fe outpt 5.  Nutrition - NPO. Multi vit, renal diet when advanced  Shelle Iron, NP [facility] [phone] 04/24/2014, 3:40 PM

## 2014-04-24 NOTE — H&P (Signed)
[name] is an 43 y.o. male.    General Surgery [facility]  Chief Complaint: secondary hyperparathyroidism, ESRD on HD  HISTORY:  The patient is a 43 year old male referred by his nephrologist for evaluation for total parathyroidectomy with autotransplantation for management of secondary hyperparathyroidism. Patient has end-stage renal disease. He has been on hemodialysis since 2007. He dialyzes at the [facility] on Mondays Wednesdays and Fridays. His current access is in his left thigh.  Patient has failed treatment with Sensipar. He developed gastrointestinal side effects. He notes chronic fatigue. He has itching. Recent laboratory studies show an intact PTH level elevated at 959, calcium 10.0, and phosphorus very high at 11.2. His calcium times phosphorus product is markedly elevated at 109. Patient has had no prior head or neck surgery.  Past Medical History  Diagnosis Date  . Renal disorder     HD  . Hypertension   . Anemia   . Thyroid disease   . Cough     DRY    . Headache(784.0)   . Muscle spasms of neck     BACK, NECK  . Depression     Past Surgical History  Procedure Laterality Date  . Hemodialysis graft to left arm and left leg      History reviewed. No pertinent family history. Social History:  reports that he has never smoked. He does not have any smokeless tobacco history on file. He reports that he does not drink alcohol or use illicit drugs.  Allergies: No Known Allergies  Medications Prior to Admission  Medication Sig Dispense Refill  . acetaminophen (TYLENOL) 500 MG tablet Take 1,000 mg by mouth every 6 (six) hours as needed for pain. For pain      . amLODipine (NORVASC) 10 MG tablet Take 10 mg by mouth daily.      . cloNIDine (CATAPRES) 0.2 MG tablet Take 0.2 mg by mouth daily.       . Multiple Vitamins-Minerals (MULTIVITAMIN WITH MINERALS) tablet Take 1 tablet by mouth daily.      . sevelamer  carbonate (RENVELA) 800 MG tablet Take 800 mg by mouth 3 (three) times daily with meals.        Results for orders placed during the hospital encounter of 04/24/14 (from the past 48 hour(s))  POCT I-STAT 4, (NA,K, GLUC, HGB,HCT)     Status: Abnormal   Collection Time    04/24/14  9:58 AM      Result Value Ref Range   Sodium 139  137 - 147 mEq/L   Potassium 6.3 (*) 3.7 - 5.3 mEq/L   Glucose, Bld 79  70 - 99 mg/dL   HCT 15.0 (*) 39.0 - 52.0 %   Hemoglobin 5.1 (*) 13.0 - 17.0 g/dL   Comment NOTIFIED PHYSICIAN    CBC     Status: Abnormal   Collection Time    04/24/14 10:23 AM      Result Value Ref Range   WBC 5.2  4.0 - 10.5 K/uL   RBC 3.89 (*) 4.22 - 5.81 MIL/uL   Hemoglobin 12.0 (*) 13.0 - 17.0 g/dL   Comment: REPEATED TO VERIFY     DELTA CHECK NOTED   HCT 37.6 (*) 39.0 - 52.0 %   MCV 96.7  78.0 - 100.0 fL   MCH 30.8  26.0 - 34.0 pg   MCHC 31.9  30.0 - 36.0 g/dL   RDW 13.1  11.5 - 15.5 %   Platelets 194  150 - 400 K/uL  BASIC METABOLIC PANEL     Status: Abnormal   Collection Time    04/24/14 10:23 AM      Result Value Ref Range   Sodium 140  137 - 147 mEq/L   Potassium 4.9  3.7 - 5.3 mEq/L   Chloride 97  96 - 112 mEq/L   CO2 24  19 - 32 mEq/L   Glucose, Bld 78  70 - 99 mg/dL   BUN 31 (*) 6 - 23 mg/dL   Creatinine, Ser 10.73 (*) 0.50 - 1.35 mg/dL   Calcium 10.3  8.4 - 10.5 mg/dL   GFR calc non Af Amer 5 (*) >90 mL/min   GFR calc Af Amer 6 (*) >90 mL/min   Comment: (NOTE)     The eGFR has been calculated using the CKD EPI equation.     This calculation has not been validated in all clinical situations.     eGFR's persistently <90 mL/min signify possible Chronic Kidney     Disease.   Anion gap 19 (*) 5 - 15  POCT I-STAT 4, (NA,K, GLUC, HGB,HCT)     Status: Abnormal   Collection Time    04/24/14 10:24 AM      Result Value Ref Range   Sodium 140  137 - 147 mEq/L   Potassium 4.7  3.7 - 5.3 mEq/L   Glucose, Bld 82  70 - 99 mg/dL   HCT 38.0 (*) 39.0 - 52.0 %    Hemoglobin 12.9 (*) 13.0 - 17.0 g/dL   No results found.  Review of Systems  Constitutional: Positive for malaise/fatigue.  HENT: Negative.   Eyes: Negative.   Respiratory: Negative.   Cardiovascular: Negative.   Gastrointestinal: Positive for abdominal pain.  Genitourinary: Negative.   Musculoskeletal: Negative.   Skin: Negative.   Neurological: Negative.   Endo/Heme/Allergies: Negative.   Psychiatric/Behavioral: Negative.     Blood pressure 148/98, pulse 68, temperature 97.2 F (36.2 C), temperature source Oral, resp. rate 18, weight 157 lb (71.215 kg), SpO2 100.00%. Physical Exam  Constitutional: He is oriented to person, place, and time. He appears well-developed and well-nourished. No distress.  HENT:  Head: Normocephalic and atraumatic.  Right Ear: External ear normal.  Left Ear: External ear normal.  Eyes: Conjunctivae are normal. Pupils are equal, round, and reactive to light. No scleral icterus.  Neck: Normal range of motion. Neck supple. No tracheal deviation present. No thyromegaly present.  Cardiovascular: Normal rate, regular rhythm and normal heart sounds.   No murmur heard. Respiratory: Effort normal and breath sounds normal. He has no wheezes.  GI: Soft. Bowel sounds are normal. He exhibits no distension. There is no tenderness.  Musculoskeletal: Normal range of motion. He exhibits no edema.  Neurological: He is alert and oriented to person, place, and time.  Skin: Skin is warm and dry.  Psychiatric: He has a normal mood and affect. His behavior is normal.     Assessment/Plan Secondary hyperparathyroidism, ESRD on HD  Plan total parathyroidectomy with autotransplantation  The risks and benefits of the procedure have been discussed at length with the patient.  The patient understands the proposed procedure, potential alternative treatments, and the course of recovery to be expected.  All of the patient's questions have been answered at this time.  The patient  wishes to proceed with surgery.  [name], MD, [facility] Office: [place] 04/24/2014, 12:05 PM

## 2014-04-24 NOTE — Progress Notes (Signed)
Transfer Note:   Arrival Method:Bed  Mental Orientation: Alert & oriented X4 Assessment: See doc flowsheet Skin: Dry,  Gauze dressing to anterior neck, R arm wrapped in ace bandage  IV: R ACE Pain: 5/10 Tubes: None Safety Measures: Fall risk interventions Fall Prevention Safety Plan: Fall risk sign, yellow socks, bed alarm Admission Screening: To be completed

## 2014-04-24 NOTE — Transfer of Care (Signed)
Immediate Anesthesia Transfer of Care Note  Patient: [name]  Procedure(s) Performed: Procedure(s) with comments: TOTAL 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 (N/A) - NECK AND LEFT FOREARM  Patient Location: PACU  Anesthesia Type:General  Level of Consciousness: awake and alert   Airway & Oxygen Therapy: Patient Spontanous Breathing and Patient connected to nasal cannula oxygen  Post-op Assessment: Report given to PACU RN and Post -op Vital signs reviewed and stable  Post vital signs: Reviewed and stable  Complications: No apparent anesthesia complications

## 2014-04-24 NOTE — Consult Note (Signed)
I have seen and examined this patient and agree with the plan of care . Plan dialysis in AM [facility] 04/24/2014, 7:49 PM

## 2014-04-24 NOTE — Brief Op Note (Signed)
04/24/2014  3:15 PM  PATIENT:  [name]  43 y.o. male  PRE-OPERATIVE DIAGNOSIS:  SECONDARY HYPERPARATHYROIDISM; END STAGE RENAL DISEASE  POST-OPERATIVE DIAGNOSIS:  SECONDARY HYPERPARATHYROIDISM; END STAGE RENAL DISEASE  PROCEDURE:  Procedure(s) with comments: TOTAL 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 (N/A) - NECK AND LEFT FOREARM  SURGEON:  Surgeon(s) and Role:    * [name], MD - Primary  ASSISTANTS: Sharyn Dross, RNFA   ANESTHESIA:   general  EBL:  Total I/O In: 800 [I.V.:800] Out: -   BLOOD ADMINISTERED:none  DRAINS: none   LOCAL MEDICATIONS USED:  NONE  SPECIMEN:  Excision  DISPOSITION OF SPECIMEN:  PATHOLOGY  COUNTS:  YES  TOURNIQUET:  * No tourniquets in log *  DICTATION: .Other Dictation: Dictation Number [id number]  PLAN OF CARE: Admit to inpatient   PATIENT DISPOSITION:  PACU - hemodynamically stable.   Delay start of Pharmacological VTE agent (>24hrs) due to surgical blood loss or risk of bleeding: yes  [name], MD, [facility] Office: [phone]

## 2014-04-25 ENCOUNTER — Encounter (HOSPITAL_COMMUNITY): Payer: Self-pay | Admitting: Surgery

## 2014-04-25 LAB — CBC
HCT: 33 % — ABNORMAL LOW (ref 39.0–52.0)
Hemoglobin: 10.7 g/dL — ABNORMAL LOW (ref 13.0–17.0)
MCH: 31 pg (ref 26.0–34.0)
MCHC: 32.4 g/dL (ref 30.0–36.0)
MCV: 95.7 fL (ref 78.0–100.0)
PLATELETS: 196 10*3/uL (ref 150–400)
RBC: 3.45 MIL/uL — ABNORMAL LOW (ref 4.22–5.81)
RDW: 13 % (ref 11.5–15.5)
WBC: 6.4 10*3/uL (ref 4.0–10.5)

## 2014-04-25 LAB — COMPREHENSIVE METABOLIC PANEL
AST: 13 U/L (ref 0–37)
Albumin: 3.1 g/dL — ABNORMAL LOW (ref 3.5–5.2)
Alkaline Phosphatase: 87 U/L (ref 39–117)
Anion gap: 19 — ABNORMAL HIGH (ref 5–15)
BUN: 37 mg/dL — ABNORMAL HIGH (ref 6–23)
CO2: 23 mEq/L (ref 19–32)
Calcium: 8.3 mg/dL — ABNORMAL LOW (ref 8.4–10.5)
Chloride: 97 mEq/L (ref 96–112)
Creatinine, Ser: 12.88 mg/dL — ABNORMAL HIGH (ref 0.50–1.35)
GFR calc Af Amer: 5 mL/min — ABNORMAL LOW (ref >90)
GFR calc non Af Amer: 4 mL/min — ABNORMAL LOW (ref >90)
GLUCOSE: 81 mg/dL (ref 70–99)
Potassium: 5.1 mEq/L (ref 3.7–5.3)
SODIUM: 139 meq/L (ref 137–147)
TOTAL PROTEIN: 6.9 g/dL (ref 6.0–8.3)
Total Bilirubin: 0.3 mg/dL (ref 0.3–1.2)

## 2014-04-25 LAB — RENAL FUNCTION PANEL
ALBUMIN: 3.2 g/dL — AB (ref 3.5–5.2)
ALBUMIN: 3.2 g/dL — AB (ref 3.5–5.2)
ANION GAP: 19 — AB (ref 5–15)
Anion gap: 14 (ref 5–15)
BUN: 46 mg/dL — ABNORMAL HIGH (ref 6–23)
BUN: 19 mg/dL (ref 6–23)
CHLORIDE: 99 meq/L (ref 96–112)
CO2: 24 mEq/L (ref 19–32)
CO2: 27 mEq/L (ref 19–32)
Calcium: 8.3 mg/dL — ABNORMAL LOW (ref 8.4–10.5)
Calcium: 10.6 mg/dL — ABNORMAL HIGH (ref 8.4–10.5)
Chloride: 97 mEq/L (ref 96–112)
Creatinine, Ser: 14.1 mg/dL — ABNORMAL HIGH (ref 0.50–1.35)
Creatinine, Ser: 7.09 mg/dL — ABNORMAL HIGH (ref 0.50–1.35)
GFR calc Af Amer: 4 mL/min — ABNORMAL LOW (ref >90)
GFR, EST AFRICAN AMERICAN: 10 mL/min — AB (ref >90)
GFR, EST NON AFRICAN AMERICAN: 4 mL/min — AB (ref >90)
GFR, EST NON AFRICAN AMERICAN: 9 mL/min — AB (ref >90)
Glucose, Bld: 95 mg/dL (ref 70–99)
Glucose, Bld: 153 mg/dL — ABNORMAL HIGH (ref 70–99)
PHOSPHORUS: 6.1 mg/dL — AB (ref 2.3–4.6)
POTASSIUM: 5.2 meq/L (ref 3.7–5.3)
Phosphorus: 3.4 mg/dL (ref 2.3–4.6)
Potassium: 4.1 mEq/L (ref 3.7–5.3)
SODIUM: 140 meq/L (ref 137–147)
Sodium: 140 mEq/L (ref 137–147)

## 2014-04-25 MED ORDER — RENA-VITE PO TABS
1.0000 | ORAL_TABLET | Freq: Every day | ORAL | Status: DC
  Administered 2014-04-25: 1 via ORAL
  Filled 2014-04-25 (×2): qty 1

## 2014-04-25 MED ORDER — DOXERCALCIFEROL 4 MCG/2ML IV SOLN
INTRAVENOUS | Status: AC
  Administered 2014-04-25: 6 ug via INTRAVENOUS
  Filled 2014-04-25: qty 4

## 2014-04-25 MED ORDER — ACETAMINOPHEN 325 MG PO TABS
650.0000 mg | ORAL_TABLET | ORAL | Status: DC | PRN
  Administered 2014-04-25 – 2014-04-26 (×5): 650 mg via ORAL
  Filled 2014-04-25 (×5): qty 2

## 2014-04-25 MED ORDER — DOXERCALCIFEROL 4 MCG/2ML IV SOLN
4.0000 ug | INTRAVENOUS | Status: DC
  Filled 2014-04-25: qty 2

## 2014-04-25 MED ORDER — DOXERCALCIFEROL 4 MCG/2ML IV SOLN
6.0000 ug | INTRAVENOUS | Status: DC
  Administered 2014-04-25: 6 ug via INTRAVENOUS
  Filled 2014-04-25: qty 4

## 2014-04-25 NOTE — Procedures (Signed)
Patient was seen on dialysis and the procedure was supervised.  BFR 400  Via AVG BP is  155/98.   Patient appears to be tolerating treatment well  Daisy Mcneel A 04/25/2014

## 2014-04-25 NOTE — Progress Notes (Signed)
CRITICAL VALUE ALERT  Critical value received:  Potassium 6.7- lab stated sample was hemolyzed  Date of notification:  04/24/2014  Critical value read back:Yes.    Nurse who received alert:  Eddie Dibbles  MD notified (1st page):  Donne Hazel, MD  Verbal orders received to recheck potassium levels.

## 2014-04-25 NOTE — Progress Notes (Signed)
Patient ID: [name], male   DOB: 02-21-1971, 43 y.o.   MRN: [id number]  [facility] - Progress Note  POD# 1  Subjective: Patient in HD.  Awake and alert.  Mild pain.  Objective: Vital signs in last 24 hours: Temp:  [98 F (36.7 C)-99.9 F (37.7 C)] 99.4 F (37.4 C) (07/24 1327) Pulse Rate:  [63-106] 104 (07/24 1630) Resp:  [18-22] 18 (07/24 1530) BP: (143-170)/(87-110) 170/105 mmHg (07/24 1630) SpO2:  [96 %-100 %] 97 % (07/24 1327) Weight:  [156 lb 8.4 oz (71 kg)-157 lb (71.215 kg)] 156 lb 8.4 oz (71 kg) (07/24 1327)    Intake/Output from previous day: 07/23 0701 - 07/24 0700 In: 1040 [P.O.:240; I.V.:800] Out: -   Exam: HEENT - clear, not icteric Neck - soft, mild STS; voice essentially normal but soft Chest - clear bilaterally Cor - RRR, no murmur Abd - soft Ext - no significant edema; incision left forearm clear and dry and intact with steristrips Neuro - grossly intact, no focal deficits  Lab Results:   Recent Labs  04/24/14 1023 04/24/14 1024 04/25/14 1340  WBC 5.2  --  6.4  HGB 12.0* 12.9* 10.7*  HCT 37.6* 38.0* 33.0*  PLT 194  --  196     Recent Labs  04/25/14 0452 04/25/14 1340  NA 139 140  K 5.1 5.2  CL 97 97  CO2 23 24  GLUCOSE 81 95  BUN 37* 46*  CREATININE 12.88* 14.10*  CALCIUM 8.3* 8.3*    Studies/Results: No results found.  Assessment / Plan: 1.  Status post total parathyroidectomy with autotransplantation to forearm  Calcium stable at 8.3  In HD today  Pain control  Home when stable from renal standpoint  [name], MD, [facility] Office: [phone]  04/25/2014

## 2014-04-25 NOTE — Progress Notes (Signed)
Patient refused SCD's stating that they make him itchy.  Patient would not like his PRN Benadryl at this time.  Patient educated on VTE prevention and verbalized understanding.

## 2014-04-25 NOTE — Anesthesia Postprocedure Evaluation (Signed)
Anesthesia Post Note  Patient: [name]  Procedure(s) Performed: Procedure(s) (LRB): TOTAL 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 (N/A)  Anesthesia type: General  Patient location: PACU  Post pain: Pain level controlled  Post assessment: Patient's Cardiovascular Status Stable  Last Vitals:  Filed Vitals:   04/25/14 0536  BP: 145/87  Pulse: 70  Temp: 37.1 C  Resp: 18    Post vital signs: Reviewed and stable  Level of consciousness: alert  Complications: No apparent anesthesia complications

## 2014-04-25 NOTE — Progress Notes (Signed)
Utilization Review Completed.Phelix Fudala T7/24/2015  

## 2014-04-25 NOTE — Progress Notes (Signed)
[facility] Progress Note  Assessment/Plan: 1. S/p total parathyroidectomy with autotransplantation - Ca from 9.2 >8.3 this am - will use added Ca bath; previously on hectorol 2; don't think it needs to be increased as high as 8 - will give 6 mcq since next HD is not until Monday;  renvela stopped; changed to oral Ca with and between meals; Medicare will not pay for outpatient calcitriol because it is under the bundle so we need to be sure to only give him medications that he is able to pay for. Continue bid renal panels to watch Ca trends - would draw another pre HD today; Reinforced with patient - he should not take renvela after d/c and that he would be d/c on home Ca pills to take with and between meals. 2. ESRD - MWF - HD today with added Ca bath; renal panels ordered but I only see BMP and CMP in the computer 3. Anemia - Hgb 12.9 - not on Aranesp 4. HTN/volume - norvasc and clonidine  5. Nutrition - renal diet - add [place], PA-C [place] [phone] 04/25/2014,9:52 AM  LOS: 1 day   Patient seen and examined, agree with above note with above modifications. Pt uncomfortable this AM to be expected. Calcium is coming down- I agree with increase hectorol and probably just to use tums at D/C- for HD later today in isolation Corliss Parish, MD 04/25/2014     Subjective:   Hurts to swallow; neck sore; asking about Ca and P levels  Objective Filed Vitals:   04/24/14 1645 04/24/14 1700 04/24/14 2104 04/25/14 0536  BP: 146/54 166/101 156/88 145/87  Pulse: 71 72 63 70  Temp: 98.1 F (36.7 C) 98.6 F (37 C) 98 F (36.7 C) 98.7 F (37.1 C)  TempSrc:  Oral Oral Oral  Resp: 16 18 18 18   Weight:   71.215 kg (157 lb)   SpO2: 98% 98% 96% 97%   Physical Exam General: uncomfortable Heart: RRR  Lungs: no wheezes or rales Abdomen: soft NT Extremities: no edema Dialysis Access:  Left thigh graft  Dialysis Orders:  MWF @ [facility]  70  kgs 3hr 2min 2K/2Ca+ 1500 Heparin L thigh AVG 400/1.5  hectorol 2 mcg IV/HD No ESA or Fe   Additional Objective Labs: Basic Metabolic Panel:  Recent Labs Lab 04/24/14 1830 04/24/14 2106 04/25/14 0452  NA 142 142 139  K 6.7* 4.8 5.1  CL 102 100 97  CO2 16* 25 23  GLUCOSE 84 90 81  BUN 34* 34* 37*  CREATININE 11.53* 11.88* 12.88*  CALCIUM 9.2 8.8 8.3*   Liver Function Tests:  Recent Labs Lab 04/24/14 1830 04/25/14 0452  AST 25 13  ALT 10 <5  ALKPHOS 90 87  BILITOT 0.2* 0.3  PROT 7.6 6.9  ALBUMIN 3.5 3.1*  CBC:  Recent Labs Lab 04/24/14 0958 04/24/14 1023 04/24/14 1024  WBC  --  5.2  --   HGB 5.1* 12.0* 12.9*  HCT 15.0* 37.6* 38.0*  MCV  --  96.7  --   PLT  --  194  --   Medications: . sodium chloride 10 mL/hr (04/24/14 1022)  . sodium chloride     . amLODipine  10 mg Oral QHS  . calcium acetate  2,001 mg Oral TID WC  . calcium carbonate  2 tablet Oral TID  . cloNIDine  0.2 mg Oral TID  . doxercalciferol  8 mcg Intravenous Q M,W,F-HD

## 2014-04-25 NOTE — Progress Notes (Signed)
Hemodialysis- BP drop to 140s-150s/70s during last 30 minutes. Pt complained of chest pain and feeling hot. HOB down, 02 applied, bfr decreased and saline given without effect. Placed on monitor. HR 48-58. Given 400cc saline and takeon off machine. After rinseback bp stablilized and HR remains in 60s-70s. Dr. Augustin Coupe notified of early discontinuation d/t bradycardia. Pt states he feels "much better". Gave 0.5L bolus total before symptoms subsided. HR 70s-80s 25 minutes after bolus. BP 174/96. Pt has no other complaints. Needles pulled and report given to RN on 6E.

## 2014-04-26 ENCOUNTER — Encounter (HOSPITAL_COMMUNITY): Payer: Self-pay | Admitting: Nephrology

## 2014-04-26 LAB — RENAL FUNCTION PANEL
Albumin: 3.1 g/dL — ABNORMAL LOW (ref 3.5–5.2)
Anion gap: 16 — ABNORMAL HIGH (ref 5–15)
BUN: 31 mg/dL — ABNORMAL HIGH (ref 6–23)
CALCIUM: 9.4 mg/dL (ref 8.4–10.5)
CO2: 24 mEq/L (ref 19–32)
CREATININE: 9.45 mg/dL — AB (ref 0.50–1.35)
Chloride: 99 mEq/L (ref 96–112)
GFR calc non Af Amer: 6 mL/min — ABNORMAL LOW (ref >90)
GFR, EST AFRICAN AMERICAN: 7 mL/min — AB (ref >90)
Glucose, Bld: 84 mg/dL (ref 70–99)
PHOSPHORUS: 5.5 mg/dL — AB (ref 2.3–4.6)
Potassium: 6.4 mEq/L — ABNORMAL HIGH (ref 3.7–5.3)
Sodium: 139 mEq/L (ref 137–147)

## 2014-04-26 LAB — POTASSIUM: Potassium: 5.1 mEq/L (ref 3.7–5.3)

## 2014-04-26 MED ORDER — CALCIUM CARBONATE ANTACID 500 MG PO CHEW
3.0000 | CHEWABLE_TABLET | Freq: Three times a day (TID) | ORAL | Status: DC
  Administered 2014-04-26: 600 mg via ORAL
  Filled 2014-04-26 (×2): qty 3

## 2014-04-26 MED ORDER — CALCIUM CARBONATE ANTACID 500 MG PO CHEW
2.0000 | CHEWABLE_TABLET | Freq: Two times a day (BID) | ORAL | Status: DC
  Administered 2014-04-26: 400 mg via ORAL
  Filled 2014-04-26: qty 2

## 2014-04-26 MED ORDER — CALCIUM CARBONATE ANTACID 500 MG PO CHEW: 2.0000 | CHEWABLE_TABLET | Freq: Two times a day (BID) | ORAL | Status: DC

## 2014-04-26 MED ORDER — CALCIUM CARBONATE ANTACID 500 MG PO CHEW
3.0000 | CHEWABLE_TABLET | Freq: Three times a day (TID) | ORAL | Status: DC
Start: 2014-04-26 — End: 2014-04-26

## 2014-04-26 MED ORDER — CALCIUM CARBONATE ANTACID 500 MG PO CHEW: 1.0000 | CHEWABLE_TABLET | Freq: Two times a day (BID) | ORAL | Status: DC

## 2014-04-26 NOTE — Discharge Instructions (Signed)
[facility]      [facility] °[phone] ° °THYROID/ PARATHYROID SURGERY: POST OP INSTRUCTIONS ° °Always review your discharge instruction sheet given to you by the facility where your surgery was performed. ° °IF YOU HAVE DISABILITY OR FAMILY LEAVE FORMS, YOU MUST BRING THEM TO THE OFFICE FOR PROCESSING.  PLEASE DO NOT GIVE THEM TO YOUR DOCTOR. ° °1. A prescription for pain medication may be given to you upon discharge.  Take your pain medication as prescribed, if needed.  If narcotic pain medicine is not needed, then you may take acetaminophen (Tylenol) or ibuprofen (Advil) as needed. °2. Take your usually prescribed medications unless otherwise directed. °3. If you need a refill on your pain medication, please contact your pharmacy. They will contact our office to request authorization.  Prescriptions will not be filled after 5pm or on week-ends. °4. You should follow a light diet the first 24 hours after arrival home, such as soup and crackers, etc.  Be sure to include lots of fluids daily.  Resume your normal diet the day after surgery. °5. Most patients will experience some swelling and bruising on the chest and neck area.  Ice packs will help.  Swelling and bruising can take several days to resolve.  °6. It is common to experience some constipation if taking pain medication after surgery.  Increasing fluid intake and taking a stool softener will usually help or prevent this problem from occurring.  A mild laxative (Milk of Magnesia or Miralax) should be taken according to package directions if there are no bowel movements after 48 hours. °7. Unless discharge instructions indicate otherwise, you may remove your bandages 24-48 hours after surgery, and you may shower at that time.  You may have steri-strips (small skin tapes) in place directly over the incision.  These strips should be left on the skin for 7-10 days.  If your surgeon used skin glue on the incision, you may shower in 24 hours.  The  glue will flake off over the next 2-3 weeks.  Any sutures or staples will be removed at the office during your follow-up visit. °8. ACTIVITIES:  You may resume regular (light) daily activities beginning the next day--such as daily self-care, walking, climbing stairs--gradually increasing activities as tolerated.  You may have sexual intercourse when it is comfortable.  Refrain from any heavy lifting or straining until approved by your doctor. °a. You may drive when you no longer are taking prescription pain medication, you can comfortably wear a seatbelt, and you can safely maneuver your car and apply brakes °b. RETURN TO WORK:  __________________________________________________________ °9. You should see your doctor in the office for a follow-up appointment approximately two weeks after your surgery.  Make sure that you call for this appointment within a day or two after you arrive home to insure a convenient appointment time. °10. OTHER INSTRUCTIONS: ____________________________________________________________________________ _________________________________________________________________________________________________________________ °_________________________________________________________________________________________________________________ ° ° °WHEN TO CALL YOUR DOCTOR: °1. Fever over 101.0 °2. Inability to urinate °3. Nausea and/or vomiting °4. Extreme swelling or bruising °5. Continued bleeding from incision. °6. Increased pain, redness, or drainage from the incision. °7. Difficulty swallowing or breathing °8. Muscle cramping or spasms. °9. Numbness or tingling in hands or feet or around lips. ° °The clinic staff is available to answer your questions during regular business hours.  Please don’t hesitate to call and ask to speak to one of the nurses if you have concerns. ° °For further questions, please visit [email or web address] °

## 2014-04-26 NOTE — Progress Notes (Signed)
2 Days Post-Op  Subjective: Awake and alert. Denies problem with swallowing or speech. Neck is sore. Denies paresthesias.  Objective: Vital signs in last 24 hours: Temp:  [98.3 F (36.8 C)-99.9 F (37.7 C)] 98.3 F (36.8 C) (07/25 0955) Pulse Rate:  [48-106] 76 (07/25 0955) Resp:  [15-22] 17 (07/25 0955) BP: (131-174)/(77-110) 131/77 mmHg (07/25 0955) SpO2:  [97 %-100 %] 100 % (07/25 0955) Weight:  [153 lb 10.6 oz (69.7 kg)-156 lb 8.4 oz (71 kg)] 154 lb 5.2 oz (70 kg) (07/24 2042) Last BM Date: 04/23/14  Intake/Output from previous day: 07/24 0701 - 07/25 0700 In: 600 [P.O.:600] Out: 1749  Intake/Output this shift: Total I/O In: 560 [P.O.:560] Out: -   General appearance: alert. Mental status normal. No distress. Neck: no adenopathy, no carotid bruit, no JVD, supple, symmetrical, trachea midline, thyroid not enlarged, symmetric, no tenderness/mass/nodules and neck incision soft. Mild tenderness. Voice is strong. No hematoma. Resp: clear to auscultation bilaterally  Lab Results:  Results for orders placed during the hospital encounter of 04/24/14 (from the past 24 hour(s))  CBC     Status: Abnormal   Collection Time    04/25/14  1:40 PM      Result Value Ref Range   WBC 6.4  4.0 - 10.5 K/uL   RBC 3.45 (*) 4.22 - 5.81 MIL/uL   Hemoglobin 10.7 (*) 13.0 - 17.0 g/dL   HCT 33.0 (*) 39.0 - 52.0 %   MCV 95.7  78.0 - 100.0 fL   MCH 31.0  26.0 - 34.0 pg   MCHC 32.4  30.0 - 36.0 g/dL   RDW 13.0  11.5 - 15.5 %   Platelets 196  150 - 400 K/uL  RENAL FUNCTION PANEL     Status: Abnormal   Collection Time    04/25/14  1:40 PM      Result Value Ref Range   Sodium 140  137 - 147 mEq/L   Potassium 5.2  3.7 - 5.3 mEq/L   Chloride 97  96 - 112 mEq/L   CO2 24  19 - 32 mEq/L   Glucose, Bld 95  70 - 99 mg/dL   BUN 46 (*) 6 - 23 mg/dL   Creatinine, Ser 14.10 (*) 0.50 - 1.35 mg/dL   Calcium 8.3 (*) 8.4 - 10.5 mg/dL   Phosphorus 6.1 (*) 2.3 - 4.6 mg/dL   Albumin 3.2 (*) 3.5 - 5.2 g/dL    GFR calc non Af Amer 4 (*) >90 mL/min   GFR calc Af Amer 4 (*) >90 mL/min   Anion gap 19 (*) 5 - 15  RENAL FUNCTION PANEL     Status: Abnormal   Collection Time    04/25/14  6:49 PM      Result Value Ref Range   Sodium 140  137 - 147 mEq/L   Potassium 4.1  3.7 - 5.3 mEq/L   Chloride 99  96 - 112 mEq/L   CO2 27  19 - 32 mEq/L   Glucose, Bld 153 (*) 70 - 99 mg/dL   BUN 19  6 - 23 mg/dL   Creatinine, Ser 7.09 (*) 0.50 - 1.35 mg/dL   Calcium 10.6 (*) 8.4 - 10.5 mg/dL   Phosphorus 3.4  2.3 - 4.6 mg/dL   Albumin 3.2 (*) 3.5 - 5.2 g/dL   GFR calc non Af Amer 9 (*) >90 mL/min   GFR calc Af Amer 10 (*) >90 mL/min   Anion gap 14  5 - 15  RENAL  FUNCTION PANEL     Status: Abnormal   Collection Time    04/26/14  6:43 AM      Result Value Ref Range   Sodium 139  137 - 147 mEq/L   Potassium 6.4 (*) 3.7 - 5.3 mEq/L   Chloride 99  96 - 112 mEq/L   CO2 24  19 - 32 mEq/L   Glucose, Bld 84  70 - 99 mg/dL   BUN 31 (*) 6 - 23 mg/dL   Creatinine, Ser 9.45 (*) 0.50 - 1.35 mg/dL   Calcium 9.4  8.4 - 10.5 mg/dL   Phosphorus 5.5 (*) 2.3 - 4.6 mg/dL   Albumin 3.1 (*) 3.5 - 5.2 g/dL   GFR calc non Af Amer 6 (*) >90 mL/min   GFR calc Af Amer 7 (*) >90 mL/min   Anion gap 16 (*) 5 - 15     Studies/Results: No results found.  Marland Kitchen amLODipine  10 mg Oral QHS  . calcium acetate  2,001 mg Oral TID WC  . calcium carbonate  2 tablet Oral TID  . cloNIDine  0.2 mg Oral TID  . doxercalciferol  6 mcg Intravenous Q M,W,F-HD  . multivitamin  1 tablet Oral QHS     Assessment/Plan: s/p Procedure(s): TOTAL 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  Status post total parathyroidectomy with autotransplantation 2 forearm. Stable surgically Calcium stable at 9.4.  Hyperkalemia(?). Potassium 6.4. Seems unusual since he had dialysis yesterday We'll repeat potassium levels stat  to rule out lab error I discussed with RN who will notify nephrology service to assess.  Home when stable from a renal  standpoint    @PROBHOSP @  LOS: 2 days    Kyeshia Zinn M 04/26/2014  . .prob

## 2014-04-26 NOTE — Progress Notes (Signed)
Left message with MD answering service [facility] for potassium of 6.4. Awaiting return call. Will continue to monitor.

## 2014-04-26 NOTE — Progress Notes (Signed)
Pt signed d/c papers. Verbalized understanding of follow up with Renal MD Monday for labwork/dialysis. Instructions given on keeping incisions clean and dry, s/sx of infection, and to return if pain worsens or pt gets fever/bleeding from incisions. Pt verbalized understanding. All questions answered. Will continue to monitor.

## 2014-04-26 NOTE — Progress Notes (Addendum)
[facility] Progress Note   Subjective: No paresthesia.  K was 6.5 this am but repeat was 5.1. Ca 9.5 today  Filed Vitals:   04/25/14 1838 04/25/14 2042 04/26/14 0549 04/26/14 0955  BP: 153/93 136/79 138/81 131/77  Pulse:  88 81 76  Temp:  98.5 F (36.9 C) 98.5 F (36.9 C) 98.3 F (36.8 C)  TempSrc:    Oral  Resp:  15 16 17   Height:      Weight:  70 kg (154 lb 5.2 oz)    SpO2:  100% 100% 100%   Exam: No distress, alert, calm Dressing ant neck is dry No jvd Chest clear bilat RRR no MRG Abd soft, NTND No LE edema L thigh graft Neuro is intact  HD: MWF NW  70 kgs 3hr 90min  2K/2.0 Bath  Heparin 1500  L thigh AVG 400/1.5  Hectorol 2 mcg TIW, No ESA or Fe         Assessment: 1 S/P parathyroidectomy- lowest Ca has been 8.3, no symptoms. Ca 9.4 today 2 ESRD on HD- K+ this am was false alarm, prob hemolyzed or wrong pt 3 Anemia no ESA for now 4 HPTH binders changed to Tums, Hectorol inc'd 2 > 6 ug TIW  Plan- OK for d/c from renal standpoint.  No severe hypocalcemia postop.  Will change to TUM's and give 500mg  x 3 with each meal and supplement with 500 mg x 2 between meals bid. Stopped Renvela and phoslo.  Hectorol increased to 6 ug tiw. Ca levels will be followed by OP HD.     Kelly Splinter MD  pager [phone]    cell [phone]  04/26/2014, 1:06 PM     Recent Labs Lab 04/25/14 1340 04/25/14 1849 04/26/14 0643 04/26/14 1117  NA 140 140 139  --   K 5.2 4.1 6.4* 5.1  CL 97 99 99  --   CO2 24 27 24   --   GLUCOSE 95 153* 84  --   BUN 46* 19 31*  --   CREATININE 14.10* 7.09* 9.45*  --   CALCIUM 8.3* 10.6* 9.4  --   PHOS 6.1* 3.4 5.5*  --     Recent Labs Lab 04/24/14 1830 04/25/14 0452 04/25/14 1340 04/25/14 1849 04/26/14 0643  AST 25 13  --   --   --   ALT 10 <5  --   --   --   ALKPHOS 90 87  --   --   --   BILITOT 0.2* 0.3  --   --   --   PROT 7.6 6.9  --   --   --   ALBUMIN 3.5 3.1* 3.2* 3.2* 3.1*    Recent Labs Lab 04/24/14 1023  04/24/14 1024 04/25/14 1340  WBC 5.2  --  6.4  HGB 12.0* 12.9* 10.7*  HCT 37.6* 38.0* 33.0*  MCV 96.7  --  95.7  PLT 194  --  196   . amLODipine  10 mg Oral QHS  . calcium acetate  2,001 mg Oral TID WC  . calcium carbonate  2 tablet Oral TID  . cloNIDine  0.2 mg Oral TID  . doxercalciferol  6 mcg Intravenous Q M,W,F-HD  . multivitamin  1 tablet Oral QHS   . sodium chloride 10 mL/hr (04/26/14 0543)  . sodium chloride     sodium chloride, sodium chloride, acetaminophen, diphenhydrAMINE, feeding supplement (NEPRO CARB STEADY), heparin, HYDROcodone-acetaminophen, HYDROmorphone (DILAUDID) injection, lidocaine (PF), lidocaine-prilocaine, ondansetron (ZOFRAN) IV, ondansetron, pentafluoroprop-tetrafluoroeth

## 2014-04-26 NOTE — Progress Notes (Signed)
[place], Renal MD aware of potassium level, agrees with recheck. Lab in room now drawing specimen. Will continue to monitor.

## 2014-04-26 NOTE — Progress Notes (Signed)
Message left at [facility] for follow up on K+, recheck 5.1. Spoke with Renal MD [place]her[place], states ok to d/c today from renal standpoint. Will continue to monitor.

## 2014-04-28 LAB — POCT I-STAT 4, (NA,K, GLUC, HGB,HCT)
Glucose, Bld: 79 mg/dL (ref 70–99)
HCT: 15 % — ABNORMAL LOW (ref 39.0–52.0)
HEMOGLOBIN: 5.1 g/dL — AB (ref 13.0–17.0)
POTASSIUM: 6.3 meq/L — AB (ref 3.7–5.3)
SODIUM: 139 meq/L (ref 137–147)

## 2014-04-29 NOTE — Discharge Summary (Signed)
  Physician Discharge Summary [facility]  Patient ID: [name] MRN: [id number] DOB/AGE: 1970/11/24 43 y.o.  Admit date: 04/24/2014 Discharge date: 04/29/2014  Admission Diagnoses:  Secondary hyperparathyroidism, ESRD  Discharge Diagnoses:  Principal Problem:   Hyperparathyroidism, secondary Active Problems:   ESRD on dialysis   Secondary hyperparathyroidism of renal origin   Discharged Condition: good  Hospital Course: Patient was admitted for observation following parathyroid surgery.  Post op course was uncomplicated.  Pain was well controlled.  Tolerated diet.  Hemodialysis and medications were managed by nephrology. Patient was prepared for discharge home on POD#2.  Consults: nephrology  Treatments: surgery: total parathyroidectomy with autotransplantation  Discharge Exam: Blood pressure 137/93, pulse 80, temperature 98.2 F (36.8 C), temperature source Oral, resp. rate 18, height 5\' 11"  (1.803 m), weight 154 lb 5.2 oz (70 kg), SpO2 100.00%. See progress note by Dr. Dalbert Batman on day of discharge.   Disposition: Home     Medication List         acetaminophen 500 MG tablet  Commonly known as:  TYLENOL  Take 1,000 mg by mouth every 6 (six) hours as needed for pain. For pain     amLODipine 10 MG tablet  Commonly known as:  NORVASC  Take 10 mg by mouth daily.     calcium carbonate 500 MG chewable tablet  Commonly known as:  TUMS - dosed in mg elemental calcium  Chew 2 tablets (400 mg of elemental calcium total) by mouth 2 (two) times daily between meals.     cloNIDine 0.2 MG tablet  Commonly known as:  CATAPRES  Take 0.2 mg by mouth daily.     multivitamin with minerals tablet  Take 1 tablet by mouth daily.     RENVELA 800 MG tablet  Generic drug:  sevelamer carbonate  Take 800 mg by mouth 3 (three) times daily with meals.           Follow-up Information   Follow up with [name], MD In 2 weeks.   Specialty:  General  Surgery   Contact information:   [address]  [place] [address] [phone]       [name], MD, [facility] Office: [phone]   Signed: [name] 04/29/2014, 7:57 AM

## 2014-05-07 ENCOUNTER — Telehealth (INDEPENDENT_AMBULATORY_CARE_PROVIDER_SITE_OTHER): Payer: Self-pay

## 2014-06-05 ENCOUNTER — Ambulatory Visit: Payer: Medicare Other

## 2014-06-12 ENCOUNTER — Ambulatory Visit (HOSPITAL_COMMUNITY)
Admission: RE | Admit: 2014-06-12 | Discharge: 2014-06-12 | Disposition: A | Discharge status: Home / Self Care | Payer: Medicare Other | Source: Ambulatory Visit | Attending: Nephrology | Admitting: Nephrology

## 2014-06-12 ENCOUNTER — Other Ambulatory Visit (HOSPITAL_COMMUNITY): Payer: Self-pay | Admitting: Nephrology

## 2014-06-12 DIAGNOSIS — R52 Pain, unspecified: Secondary | ICD-10-CM

## 2014-06-12 DIAGNOSIS — M25579 Pain in unspecified ankle and joints of unspecified foot: Secondary | ICD-10-CM | POA: Diagnosis present

## 2014-06-29 ENCOUNTER — Encounter (HOSPITAL_COMMUNITY): Payer: Self-pay | Admitting: Emergency Medicine

## 2014-06-29 ENCOUNTER — Emergency Department (HOSPITAL_COMMUNITY)
Admission: EM | Admit: 2014-06-29 | Discharge: 2014-06-29 | Disposition: A | Discharge status: Home / Self Care | Payer: Medicare Other | Attending: Emergency Medicine | Admitting: Emergency Medicine

## 2014-06-29 DIAGNOSIS — Z79899 Other long term (current) drug therapy: Secondary | ICD-10-CM | POA: Diagnosis not present

## 2014-06-29 DIAGNOSIS — Z87891 Personal history of nicotine dependence: Secondary | ICD-10-CM | POA: Diagnosis not present

## 2014-06-29 DIAGNOSIS — R17 Unspecified jaundice: Secondary | ICD-10-CM | POA: Insufficient documentation

## 2014-06-29 DIAGNOSIS — Z862 Personal history of diseases of the blood and blood-forming organs and certain disorders involving the immune mechanism: Secondary | ICD-10-CM | POA: Diagnosis not present

## 2014-06-29 DIAGNOSIS — N186 End stage renal disease: Secondary | ICD-10-CM | POA: Diagnosis not present

## 2014-06-29 DIAGNOSIS — R519 Headache, unspecified: Secondary | ICD-10-CM

## 2014-06-29 DIAGNOSIS — Z8639 Personal history of other endocrine, nutritional and metabolic disease: Secondary | ICD-10-CM | POA: Diagnosis not present

## 2014-06-29 DIAGNOSIS — Z8659 Personal history of other mental and behavioral disorders: Secondary | ICD-10-CM | POA: Diagnosis not present

## 2014-06-29 DIAGNOSIS — H539 Unspecified visual disturbance: Secondary | ICD-10-CM | POA: Diagnosis not present

## 2014-06-29 DIAGNOSIS — I120 Hypertensive chronic kidney disease with stage 5 chronic kidney disease or end stage renal disease: Secondary | ICD-10-CM | POA: Insufficient documentation

## 2014-06-29 DIAGNOSIS — R51 Headache: Secondary | ICD-10-CM | POA: Insufficient documentation

## 2014-06-29 DIAGNOSIS — Z992 Dependence on renal dialysis: Secondary | ICD-10-CM | POA: Diagnosis not present

## 2014-06-29 LAB — CBC WITH DIFFERENTIAL/PLATELET
Basophils Absolute: 0 10*3/uL (ref 0.0–0.1)
Basophils Relative: 0 % (ref 0–1)
Eosinophils Absolute: 0.1 10*3/uL (ref 0.0–0.7)
Eosinophils Relative: 2 % (ref 0–5)
HCT: 37.5 % — ABNORMAL LOW (ref 39.0–52.0)
Hemoglobin: 12.4 g/dL — ABNORMAL LOW (ref 13.0–17.0)
LYMPHS ABS: 2.1 10*3/uL (ref 0.7–4.0)
LYMPHS PCT: 36 % (ref 12–46)
MCH: 30.8 pg (ref 26.0–34.0)
MCHC: 33.1 g/dL (ref 30.0–36.0)
MCV: 93.3 fL (ref 78.0–100.0)
Monocytes Absolute: 0.4 10*3/uL (ref 0.1–1.0)
Monocytes Relative: 6 % (ref 3–12)
NEUTROS PCT: 56 % (ref 43–77)
Neutro Abs: 3.3 10*3/uL (ref 1.7–7.7)
Platelets: 199 10*3/uL (ref 150–400)
RBC: 4.02 MIL/uL — AB (ref 4.22–5.81)
RDW: 13.6 % (ref 11.5–15.5)
WBC: 5.9 10*3/uL (ref 4.0–10.5)

## 2014-06-29 LAB — COMPREHENSIVE METABOLIC PANEL
ALBUMIN: 4.1 g/dL (ref 3.5–5.2)
ALK PHOS: 86 U/L (ref 39–117)
ALT: 8 U/L (ref 0–53)
ANION GAP: 18 — AB (ref 5–15)
AST: 10 U/L (ref 0–37)
BILIRUBIN TOTAL: 0.2 mg/dL — AB (ref 0.3–1.2)
BUN: 44 mg/dL — AB (ref 6–23)
CHLORIDE: 95 meq/L — AB (ref 96–112)
CO2: 25 meq/L (ref 19–32)
Calcium: 10.9 mg/dL — ABNORMAL HIGH (ref 8.4–10.5)
Creatinine, Ser: 13.96 mg/dL — ABNORMAL HIGH (ref 0.50–1.35)
GFR calc Af Amer: 4 mL/min — ABNORMAL LOW (ref >90)
GFR, EST NON AFRICAN AMERICAN: 4 mL/min — AB (ref >90)
Glucose, Bld: 83 mg/dL (ref 70–99)
POTASSIUM: 5.5 meq/L — AB (ref 3.7–5.3)
Sodium: 138 mEq/L (ref 137–147)
Total Protein: 8.8 g/dL — ABNORMAL HIGH (ref 6.0–8.3)

## 2014-06-29 NOTE — ED Notes (Addendum)
Pt c/o right sided headpain x 3 weks with some floaters at times; pt dialysis pt with last dialysis on Friday

## 2014-06-29 NOTE — ED Provider Notes (Signed)
CSN: [id number]     Arrival date & time 06/29/14  1230 History   First MD Initiated Contact with Patient 06/29/14 1633     Chief Complaint  Patient presents with  . Headache   HPI  Patient is a 43 y.o. Male with ESRD on HD MWF who presents to the ED with right sided headaches and flashing lights in his right eye.  Patient states that for the past 3.5 weeks he has been experiencing some intermittent right sided throbbing headaches that are moderate in nature.  Patient states that he has never had headaches like this before.  He is also complaining of right sided flashing in his right eye that happens intermittently and seems to occur a lot when he is standing up and also when he is working.  Patient states that he has had no trauma to the head recently.  Patient does not notice any changes in his baseline vision.  He has been going to dialysis consistently.  Patient states that he has no eye pain or headache at this time.  Patient has tried tylenol for this pain at home with little relief.      Past Medical History  Diagnosis Date  . ESRD on hemodialysis     HD  . Hypertension   . Anemia   . Thyroid disease   . Cough     DRY    . Headache(784.0)   . Muscle spasms of neck     BACK, NECK  . Depression    Past Surgical History  Procedure Laterality Date  . Hemodialysis graft to left arm and left leg    . Parathyroidectomy N/A 04/24/2014    Procedure: TOTAL 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;  Surgeon: [name], MD;  Location: [facility];  Service: General;  Laterality: N/A;  NECK AND LEFT FOREARM   History reviewed. No pertinent family history. History  Substance Use Topics  . Smoking status: Former Smoker    Quit date: 03/21/1986  . Smokeless tobacco: Never Used  . Alcohol Use: No    Review of Systems  Constitutional: Negative for fever, chills and fatigue.  Eyes: Positive for visual disturbance. Negative for photophobia, pain, discharge and redness.  Respiratory:  Negative for chest tightness and shortness of breath.   Cardiovascular: Negative for chest pain and palpitations.  Musculoskeletal: Negative for neck pain and neck stiffness.  Neurological: Positive for headaches. Negative for dizziness, weakness and numbness.  All other systems reviewed and are negative.     Allergies  Review of patient's allergies indicates no known allergies.  Home Medications   Prior to Admission medications   Medication Sig Start Date End Date Taking? Authorizing Provider  acetaminophen (TYLENOL) 500 MG tablet Take 1,000 mg by mouth every 6 (six) hours as needed for pain. For pain   Yes Historical Provider, MD  amLODipine (NORVASC) 10 MG tablet Take 10 mg by mouth daily.   Yes Historical Provider, MD  calcium carbonate (TUMS - DOSED IN MG ELEMENTAL CALCIUM) 500 MG chewable tablet Chew 2 tablets (400 mg of elemental calcium total) by mouth 2 (two) times daily between meals. 04/26/14  Yes Doreen Salvage, MD  cloNIDine (CATAPRES) 0.2 MG tablet Take 0.2 mg by mouth daily.    Yes Historical Provider, MD  Multiple Vitamins-Minerals (MULTIVITAMIN WITH MINERALS) tablet Take 1 tablet by mouth daily.   Yes Historical Provider, MD   BP 165/93  Pulse 71  Temp(Src) 97.7 F (36.5 C) (Oral)  Resp 16  SpO2 100% Physical Exam  Nursing note and vitals reviewed. Constitutional: He is oriented to person, place, and time. He appears well-developed and well-nourished. No distress.  HENT:  Head: Normocephalic and atraumatic.  Mouth/Throat: Oropharynx is clear and moist. No oropharyngeal exudate.  Eyes: Conjunctivae and EOM are normal. Pupils are equal, round, and reactive to light. Right conjunctiva is not injected. Right conjunctiva has no hemorrhage. Left conjunctiva is not injected. Left conjunctiva has no hemorrhage. Scleral icterus is present.  Fundoscopic exam:      The right eye shows red reflex.       The left eye shows red reflex.  Fundoscopic exam attempted.  Optic disc not  visualized bilaterally. There is no frank hemorrhage, vessel nicking or exudate noted.  Neck: Normal range of motion. Neck supple. No JVD present. No Brudzinski's sign and no Kernig's sign noted. No thyromegaly present.  Cardiovascular: Normal rate, regular rhythm and normal heart sounds.  Exam reveals no gallop and no friction rub.   No murmur heard. Pulmonary/Chest: Effort normal and breath sounds normal. No respiratory distress. He has no wheezes. He has no rales. He exhibits no tenderness.  Abdominal: Soft. Bowel sounds are normal. He exhibits no distension and no mass. There is no tenderness. There is no rebound and no guarding.  Musculoskeletal: Normal range of motion.  Lymphadenopathy:    He has no cervical adenopathy.  Neurological: He is alert and oriented to person, place, and time. He has normal strength. No cranial nerve deficit or sensory deficit. Coordination normal.  Skin: Skin is warm and dry. He is not diaphoretic.  Psychiatric: He has a normal mood and affect. His behavior is normal. Judgment and thought content normal.    ED Course  Procedures (including critical care time) Labs Review Labs Reviewed  CBC WITH DIFFERENTIAL - Abnormal; Notable for the following:    RBC 4.02 (*)    Hemoglobin 12.4 (*)    HCT 37.5 (*)    All other components within normal limits  COMPREHENSIVE METABOLIC PANEL - Abnormal; Notable for the following:    Potassium 5.5 (*)    Chloride 95 (*)    BUN 44 (*)    Creatinine, Ser 13.96 (*)    Calcium 10.9 (*)    Total Protein 8.8 (*)    Total Bilirubin 0.2 (*)    GFR calc non Af Amer 4 (*)    GFR calc Af Amer 4 (*)    Anion gap 18 (*)    All other components within normal limits    Imaging Review No results found.   EKG Interpretation None      MDM   Final diagnoses:  Visual disturbance  Nonintractable headache, unspecified chronicity pattern, unspecified headache type   Patient is a 43 y.o. Male who presents to the ED with  complaints of headache and right eye flashing.  Physical exam reveals no focal neurological deficits.  Visual acuity is 20/20 in the affected eye.  Labs appear to be at baseline and patient is due for dialysis tomorrow.  Patient appears to be stable and has no complaint at this time.  Will refer the patient to opthalmology for further eye workup.  Doubt acute angle closure glaucoma at this time or uveitis/iritis.  Patient is stable for discharge at this time.  I have discussed this patient with Dr. Eulis Foster who agrees with the above plan.  Dr. Eulis Foster has also examined the patient.  Patient states understanding and agreement with the above plan.  Cherylann Parr, PA-C 06/29/14 1754

## 2014-06-29 NOTE — ED Provider Notes (Signed)
  Face-to-face evaluation   History: He complains of a sensation of headache buzzing sensation, and occasional flashing lights in the upper visual fields, for several weeks. Symptoms are intermittent. There's no associated fever, chills, nausea, vomiting, weakness, or dizziness. Receiving regular dialysis treatment  Physical exam: Alert, calm, cooperative. It is nontender to palpation. Neck and upper back are nontender. Heart regular rate and rhythm. No murmur. Neurologic- cranial nerves appear intact, but there is no nystagmus, dysarthria or dysphasia  Medical screening examination/treatment/procedure(s) were conducted as a shared visit with non-physician practitioner(s) and myself.  I personally evaluated the patient during the encounter  Richarda Blade, MD 06/30/14 [phone]

## 2014-06-29 NOTE — Discharge Instructions (Signed)
Visual Disturbances °You have had a disturbance in your vision. This may be caused by various conditions, such as: °· Migraines. Migraine headaches are often preceded by a disturbance in vision. Blind spots or light flashes are followed by a headache. This type of visual disturbance is temporary. It does not damage the eye. °· Glaucoma. This is caused by increased pressure in the eye. Symptoms include haziness, blurred vision, or seeing rainbow colored circles when looking at bright lights. Partial or complete visual loss can occur. You may or may not experience eye pain. Visual loss may be gradual or sudden and is irreversible. Glaucoma is the leading cause of blindness. °· Retina problems. Vision will be reduced if the retina becomes detached or if there is a circulation problem as with diabetes, high blood pressure, or a mini-stroke. Symptoms include seeing "floaters," flashes of light, or shadows, as if a curtain has fallen over your eye. °· Optic nerve problems. The main nerve in your eye can be damaged by redness, soreness, and swelling (inflammation), poor circulation, drugs, and toxins. °It is very important to have a complete exam done by a specialist to determine the exact cause of your eye problem. The specialist may recommend medicines or surgery, depending on the cause of the problem. This can help prevent further loss of vision or reduce the risk of having a stroke. Contact the caregiver to whom you have been referred and arrange for follow-up care right away. °SEEK IMMEDIATE MEDICAL CARE IF:  °· Your vision gets worse. °· You develop severe headaches. °· You have any weakness or numbness in the face, arms, or legs. °· You have any trouble speaking or walking. °Document Released: 10/27/2004 Document Revised: 12/12/2011 Document Reviewed: 02/17/2010 °ExitCare® Patient Information ©2015 [employer]. This information is not intended to replace advice given to you by your health care provider. Make sure  you discuss any questions you have with your health care provider. ° °

## 2014-07-14 ENCOUNTER — Emergency Department (HOSPITAL_COMMUNITY)
Admission: EM | Admit: 2014-07-14 | Discharge: 2014-07-15 | Disposition: A | Discharge status: Home / Self Care | Payer: Medicare Other | Attending: Emergency Medicine | Admitting: Emergency Medicine

## 2014-07-14 ENCOUNTER — Encounter (HOSPITAL_COMMUNITY): Payer: Self-pay | Admitting: Emergency Medicine

## 2014-07-14 DIAGNOSIS — Z8739 Personal history of other diseases of the musculoskeletal system and connective tissue: Secondary | ICD-10-CM | POA: Insufficient documentation

## 2014-07-14 DIAGNOSIS — Z992 Dependence on renal dialysis: Secondary | ICD-10-CM | POA: Insufficient documentation

## 2014-07-14 DIAGNOSIS — Z87891 Personal history of nicotine dependence: Secondary | ICD-10-CM | POA: Insufficient documentation

## 2014-07-14 DIAGNOSIS — R110 Nausea: Secondary | ICD-10-CM | POA: Insufficient documentation

## 2014-07-14 DIAGNOSIS — I120 Hypertensive chronic kidney disease with stage 5 chronic kidney disease or end stage renal disease: Secondary | ICD-10-CM | POA: Insufficient documentation

## 2014-07-14 DIAGNOSIS — J019 Acute sinusitis, unspecified: Secondary | ICD-10-CM | POA: Insufficient documentation

## 2014-07-14 DIAGNOSIS — Z79899 Other long term (current) drug therapy: Secondary | ICD-10-CM | POA: Insufficient documentation

## 2014-07-14 DIAGNOSIS — Z8639 Personal history of other endocrine, nutritional and metabolic disease: Secondary | ICD-10-CM | POA: Insufficient documentation

## 2014-07-14 DIAGNOSIS — Z862 Personal history of diseases of the blood and blood-forming organs and certain disorders involving the immune mechanism: Secondary | ICD-10-CM | POA: Insufficient documentation

## 2014-07-14 DIAGNOSIS — N186 End stage renal disease: Secondary | ICD-10-CM | POA: Insufficient documentation

## 2014-07-14 DIAGNOSIS — H9202 Otalgia, left ear: Secondary | ICD-10-CM | POA: Insufficient documentation

## 2014-07-14 DIAGNOSIS — Z8659 Personal history of other mental and behavioral disorders: Secondary | ICD-10-CM | POA: Insufficient documentation

## 2014-07-14 MED ORDER — FLUTICASONE PROPIONATE 50 MCG/ACT NA SUSP: 2.0000 | Freq: Every day | NASAL | Status: DC

## 2014-07-14 MED ORDER — AMOXICILLIN-POT CLAVULANATE 875-125 MG PO TABS: 1.0000 | ORAL_TABLET | Freq: Two times a day (BID) | ORAL | Status: DC

## 2014-07-14 NOTE — ED Notes (Signed)
Pt. reports runny nose, nasal congestion , productive cough and left ear ache for 2 weeks with low grade fever . Denies body aches /emesis or diarrhea.

## 2014-07-14 NOTE — ED Notes (Addendum)
Pt c/o productive cough, l sided ear ache, headache, fever, and runny nose x 2 weeks. Reports yellow phlegm. Has taken Dayqui and Tylenol at home without relief

## 2014-07-14 NOTE — Discharge Instructions (Signed)
Sinusitis Sinusitis is redness, soreness, and puffiness (inflammation) of the air pockets in the bones of your face (sinuses). The redness, soreness, and puffiness can cause air and mucus to get trapped in your sinuses. This can allow germs to grow and cause an infection.  HOME CARE   Drink enough fluids to keep your pee (urine) clear or pale yellow.  Use a humidifier in your home.  Run a hot shower to create steam in the bathroom. Sit in the bathroom with the door closed. Breathe in the steam 3-4 times a day.  Put a warm, moist washcloth on your face 3-4 times a day, or as told by your doctor.  Use salt water sprays (saline sprays) to wet the thick fluid in your nose. This can help the sinuses drain.  Only take medicine as told by your doctor. GET HELP RIGHT AWAY IF:   Your pain gets worse.  You have very bad headaches.  You are sick to your stomach (nauseous).  You throw up (vomit).  You are very sleepy (drowsy) all the time.  Your face is puffy (swollen).  Your vision changes.  You have a stiff neck.  You have trouble breathing. MAKE SURE YOU:   Understand these instructions.  Will watch your condition.  Will get help right away if you are not doing well or get worse. Document Released: 03/07/2008 Document Revised: 06/13/2012 Document Reviewed: 04/24/2012 [facility] Patient Information 2015 [place], [place]. This information is not intended to replace advice given to you by your health care provider. Make sure you discuss any questions you have with your health care provider.   Emergency Department Resource Guide 1) Find a Doctor and Pay Out of Pocket Although you won't have to find out who is covered by your insurance plan, it is a good idea to ask around and get recommendations. You will then need to call the office and see if the doctor you have chosen will accept you as a new patient and what types of options they offer for patients who are self-pay. Some doctors  offer discounts or will set up payment plans for their patients who do not have insurance, but you will need to ask so you aren't surprised when you get to your appointment.  2) Contact Your Local Health Department Not all health departments have doctors that can see patients for sick visits, but many do, so it is worth a call to see if yours does. If you don't know where your local health department is, you can check in your phone book. The CDC also has a tool to help you locate your state's health department, and many state websites also have listings of all of their local health departments.  3) Find a [facility] If your illness is not likely to be very severe or complicated, you may want to try a walk in clinic. These are popping up all over the country in pharmacies, drugstores, and shopping centers. They're usually staffed by nurse practitioners or physician assistants that have been trained to treat common illnesses and complaints. They're usually fairly quick and inexpensive. However, if you have serious medical issues or chronic medical problems, these are probably not your best option.  No Primary Care Doctor: - Call Health Connect at  [phone] - they can help you locate a primary care doctor that  accepts your insurance, provides certain services, etc. - Physician Referral Service- [phone]  Chronic Pain Problems: Organization         Address  Phone   Notes  [facility]  [phone] Patients need to be referred by their primary care doctor.   Medication Assistance: Organization         Address  Phone   Notes  [facility] Medication [facility] [address] [place], [place] [address] [phone] --Must be a resident of [facility] -- Must have NO insurance coverage whatsoever (no Medicaid/ Medicare, etc.) -- The pt. MUST have a primary care doctor that directs their care regularly and follows them in the community    MedAssist  [phone]   [employer]  [phone]    Agencies that provide inexpensive medical care: Organization         Address  Phone   Notes  [place]  [phone]   [facility]    [phone]   [facility] [place], [place] [address] [phone]   [place] [address], [place] [phone]   Planned Parenthood    [phone]   [facility]    [phone]   [place] and [address], [place] Phone:  [phone], Fax:  [phone] Hours of Operation:  9 am - 6 pm, M-F.  Also accepts Medicaid/Medicare and self-pay.  [facility] for [address] [place], [address], [place] Phone: [phone], Fax: [phone]. Hours of Operation:  8:30 am - 5:30 pm, M-F.  Also accepts Medicaid and self-pay.  [facility] [place] [address] [place] Phone: [phone]   [place], [place], [place] [phone], Ext. [phone] Mondays & Thursdays: 7-9 AM.  First 15 patients are seen on a first come, first serve basis.    [place] Providers:  Organization         Address  Phone   Notes  [facility] [address], [place] [phone] Also accepts self-pay patients.  [facility] [id number] [place], [place]  [phone]   [place], [address], [place] [phone]   [facility] [address], [place] [phone]   Lucianne Lei [address], [place]   [phone] Only accepts [place] Access [place] patients after they have their name applied to their card.   Self-Pay (no insurance) in [facility]:  Organization         Address  Phone   Notes  Sickle Cell Patients, [facility]  Medicine [place] [phone]   [facility] [place] [phone]   [facility] [place]  [place], [place], [place] [phone]   [facility]/Dr. Osei-Bonsu  [address] [place] or [name], [place] [phone] Phone number for both [place] and [place] locations is the same.  [facility] [address], [place] [phone]   [facility] [address], [place] or [address] [phone]   [facility] [address], [place] [phone], phone; [phone], fax Sees patients 1st and 3rd Saturday of every month.  Must not qualify for public  or private insurance (i.e. Medicaid, Medicare, [employer] Health Choice, Veterans' Benefits)  Household income should be no more than 200% of the poverty level The clinic cannot treat you if you are pregnant or think you are pregnant  Sexually transmitted diseases are not treated at the clinic.    Dental Care: Organization         Address  Phone  Notes  [facility] [place] [phone] Accepts children up to age 66 who are enrolled in [place] or [place]; pregnant women with a Medicaid card; and children who have applied for Medicaid or [place] Health Choice, but were declined, whose parents can pay a reduced fee at time of service.  [facility]  [address], [place] [phone] Accepts children up to age 38 who are enrolled in [place] or [place]; pregnant women with a Medicaid card; and children who have applied for Medicaid or [place] Health Choice, but were declined, whose parents can pay a reduced fee at time of service.  [place] Adult Dental Access PROGRAM  [place] [phone] Patients are seen by appointment only. Walk-ins are not accepted. [place] will see patients 32 years of age and older. Monday - Tuesday (8am-5pm) Most Wednesdays (8:30-5pm) $30 per visit, cash only  [facility] Adult Dental Access PROGRAM  [address], [facility] [phone] Patients are seen by appointment only. Walk-ins are not accepted. [place] will see patients 40 years of age and older. One Wednesday Evening (Monthly: Volunteer Based).  $30 per visit, cash only  [place]  [phone] for adults; Children under age 59, call Graduate Pediatric Dentistry at [phone]. Children aged 66-14, please call [phone] to request a pediatric application.  Dental services are provided in all areas of dental care including fillings, crowns and bridges, complete and partial dentures, implants, gum treatment, root canals, and extractions. Preventive care is also provided. Treatment is provided to both adults and children. Patients are selected via a lottery and there is often a waiting list.   [facility] [address], [place]  [phone] [email or web address]   [facility] [address] [place], [place] [phone], Ext. [phone] Second and Fourth Thursday of each month, opens at 6:30 AM; Clinic ends at 9 AM.  Patients are seen on a first-come first-served basis, and a limited number are seen during each clinic.   [facility]  [address] Hillard Danker [place], [place] [phone]   Eligibility Requirements You must have lived in [place], [place], or [place] counties for at least the last three months.   You cannot be eligible for state or federal sponsored [employer], including [employer], [place], or [employer].   You generally cannot be eligible for healthcare insurance through your employer.    How to apply: Eligibility screenings are held every Tuesday and  Wednesday afternoon from 1:00 pm until 4:00 pm. You do not need an appointment for the interview!  [facility] [address], [place], [place]   [employer]  [place] Department  [place]  [phone]    Behavioral Health Resources in the Community: Intensive Outpatient Programs Organization         Address  Phone  Notes  [place] [address] [place], [place] [phone]   [facility] Outpatient [address] [place], [place]   ADS: Alcohol & Drug S[facility]cs [address] [place], [place]   [place] [address],  [place], [place] or [phone]   Substance Abuse Resources Organization         Address  Phone  Notes  Alcohol and Drug Services  [phone]   [place]  [phone]   [facility] [place]   Chinita Pester  [phone]   Residential & Outpatient Substance Abuse Program  [phone]   Psychological Services Organization         Address  Phone  Notes  [facility]  [place]  [phone]   [place] [address] [place] or [phone]    Mobile Crisis Teams Organization         Address  Phone  Notes  [facility], Mobile Crisis Care Unit  [phone]   [facility]  [address] [place], [place]   [place] [address], [place] [phone]    Self-Help/Support Groups Organization         Address  Phone             Notes  [place]. of [place] - variety of support groups  [place] Call for more information  Narcotics Anonymous (NA), [facility] [address], [employer] [place]  2 meetings at this location   Financial planner         Address  Phone  Notes  [facility] [place],    [place]  [phone]   [facility]  [address], [place] [phone], [place], [place]   [place], [place] [phone] Admissions: 8am-3pm M-F  Incentives Substance [place] [address]    [place], [place] [phone]   [facility] [address] [place], [place], [place]   [facility] [address],  [place], [place]   [facility] Programs - Intensive Outpatient [address] Kristeen [address], [place], [place]   [facility] [address]  [place], [place] [phone] or [phone]   [facility] (RTS) [address], [place], [place] Accepts Medicaid  [facility] [place] [address],  [place] [phone] Substance Abuse/Addiction Treatment   [facility] Organization         Address  Phone  Notes  [facility]  [phone]   Domenic Schwab, PhD [address] Arlis Port[place], [place]   [phone] or [phone]   [place]   [address] [place], [place] [phone]   [place] [address], [place], [place] [phone] Insurance/Medicaid/sponsorship through [facility] [address] [place]                                    [place], [place] [phone] [place] [address], [place] [phone]    Dr. Adele Schilder  [phone]   [facility]  Dept. 1) [address], [place] 2) [place] 3)  [place] [address], [place] [phone]  [phone]   [place] [phone] or [phone] (After Hours)

## 2014-07-14 NOTE — ED Provider Notes (Signed)
CSN: [id number]     Arrival date & time 07/14/14  1954 History  This chart was scribed for non-physician practitioner, Starlyn Skeans, PA-C working with Tanna Furry, MD by Frederich Balding, ED scribe. This patient was seen in room TR06C/TR06C and the patient's care was started at 10:49 PM.   Chief Complaint  Patient presents with  . Nasal Congestion  . Cough  . Otalgia   The history is provided by the patient. No language interpreter was used.   HPI Comments: [name] is a 43 y.o. male who presents to the Emergency Department complaining of left ear pain, congestion and yellow rhinorrhea that started 2 weeks ago. Also reports subjective fever, productive cough of yellow sputum and nausea. Pt has taken aleve, tylenol and DayQuil with little relief. Denies sore throat, facial pain, chest pain, SOB, abdominal pain, emesis. Denies sick contacts or recent travel. Denies history of sinus infections. Pt does not smoke cigarettes.   Past Medical History  Diagnosis Date  . ESRD on hemodialysis     HD  . Hypertension   . Anemia   . Thyroid disease   . Cough     DRY    . Headache(784.0)   . Muscle spasms of neck     BACK, NECK  . Depression    Past Surgical History  Procedure Laterality Date  . Hemodialysis graft to left arm and left leg    . 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 History  Substance Use Topics  . Smoking status: Former Smoker    Quit date: 03/21/1986  . Smokeless tobacco: Never Used  . Alcohol Use: No    Review of Systems  Constitutional: Positive for fever.  HENT: Positive for congestion, ear pain and rhinorrhea. Negative for sore throat.   Respiratory: Positive for cough. Negative for shortness of breath.   Cardiovascular: Negative for chest pain.   Gastrointestinal: Positive for nausea. Negative for vomiting and abdominal pain.  All other systems reviewed and are negative.  Allergies  Review of patient's allergies indicates no known allergies.  Home Medications   Prior to Admission medications   Medication Sig Start Date End Date Taking? Authorizing Provider  acetaminophen (TYLENOL) 500 MG tablet Take 1,000 mg by mouth every 6 (six) hours as needed for pain. For pain   Yes Historical Provider, MD  amLODipine (NORVASC) 10 MG tablet Take 10 mg by mouth daily.   Yes Historical Provider, MD  calcium carbonate (TUMS - DOSED IN MG ELEMENTAL CALCIUM) 500 MG chewable tablet Chew 2 tablets (400 mg of elemental calcium total) by mouth 2 (two) times daily between meals. 04/26/14  Yes Doreen Salvage, MD  cloNIDine (CATAPRES) 0.2 MG tablet Take 0.2 mg by mouth daily.    Yes Historical Provider, MD  amoxicillin-clavulanate (AUGMENTIN) 875-125 MG per tablet Take 1 tablet by mouth 2 (two) times daily. One po bid x 7 days 07/14/14   Lanore Renderos A Forcucci, PA-C  fluticasone (FLONASE) 50 MCG/ACT nasal spray Place 2 sprays into both nostrils daily. 07/14/14   [place]ka Lafortune A Forcucci, PA-C   BP 138/81  Pulse 83  Temp(Src) 98.7 F (37.1 C) (Oral)  Resp 18  Wt 156 lb 8 oz (70.988 kg)  SpO2 97%  Physical Exam  Nursing note and vitals reviewed. Constitutional: He is  oriented to person, place, and time. He appears well-developed and well-nourished. No distress.  HENT:  Head: Normocephalic and atraumatic.  Right Ear: Tympanic membrane and ear canal normal.  Left Ear: Tympanic membrane is not erythematous, not retracted and not bulging. A middle ear effusion is present.  Nose: Mucosal edema present. Right sinus exhibits no maxillary sinus tenderness and no frontal sinus tenderness. Left sinus exhibits no maxillary sinus tenderness and no frontal sinus tenderness.  Mouth/Throat: Oropharynx is clear and moist. No oropharyngeal exudate.  Eyes: Conjunctivae and EOM  are normal. Pupils are equal, round, and reactive to light. No scleral icterus.  Neck: Normal range of motion. Neck supple. No tracheal deviation present. No thyromegaly present.  Cardiovascular: Normal rate, regular rhythm and normal heart sounds.  Exam reveals no gallop and no friction rub.   No murmur heard. Pulmonary/Chest: Effort normal and breath sounds normal. No respiratory distress. He has no wheezes. He has no rhonchi. He has no rales.  Musculoskeletal: Normal range of motion.  Lymphadenopathy:    He has cervical adenopathy.  Neurological: He is alert and oriented to person, place, and time.  Skin: Skin is warm and dry.  Psychiatric: He has a normal mood and affect. His behavior is normal. Judgment and thought content normal.    ED Course  Procedures (including critical care time)  DIAGNOSTIC STUDIES: Oxygen Saturation is 97% on RA, normal by my interpretation.    COORDINATION OF CARE: 10:56 PM-Discussed treatment plan which includes Augmentin and Flonase with pt at bedside and pt agreed to plan.   Labs Review Labs Reviewed - No data to display  Imaging Review No results found.   EKG Interpretation None      MDM   Final diagnoses:  Acute sinusitis, recurrence not specified, unspecified location   Patient is a 43 y.o. Male who presents to the ED with cough, congestion, malaise, and fevers.  Physical exam reveals afebrile patient in NAD who is non-toxic appearing.  Physical exam reveals cervical adenopathy and large mucosal edema.  Given history of 2 weeks of illness suspect possible bacterial sinus infection.  Will treat with augmentin and flonase.  Augmentin will cover any type of lung illness so no chest xray warranted today.  Patient to return for intractable fevers, shortness of breath or any other concerning symptoms.  Patient agrees to the above plan.  I personally performed the services described in this documentation, which was scribed in my presence. The  recorded information has been reviewed and is accurate.  Cherylann Parr, PA-C 07/14/14 2336

## 2014-07-15 NOTE — ED Notes (Signed)
Declined W/C at D/C and was escorted to lobby by RN. 

## 2014-07-20 NOTE — ED Provider Notes (Signed)
Medical screening examination/treatment/procedure(s) were conducted as a shared visit with non-physician practitioner(s) and myself.  I personally evaluated the patient during the encounter.   EKG Interpretation None        Tanna Furry, MD 07/20/14 [phone]

## 2014-12-05 ENCOUNTER — Emergency Department (HOSPITAL_COMMUNITY): Payer: Medicare Other

## 2014-12-05 ENCOUNTER — Encounter (HOSPITAL_COMMUNITY): Payer: Self-pay | Admitting: Emergency Medicine

## 2014-12-05 ENCOUNTER — Emergency Department (HOSPITAL_COMMUNITY)
Admission: EM | Admit: 2014-12-05 | Discharge: 2014-12-05 | Disposition: A | Discharge status: Home / Self Care | Payer: Medicare Other | Attending: Emergency Medicine | Admitting: Emergency Medicine

## 2014-12-05 DIAGNOSIS — R05 Cough: Secondary | ICD-10-CM

## 2014-12-05 DIAGNOSIS — R059 Cough, unspecified: Secondary | ICD-10-CM

## 2014-12-05 DIAGNOSIS — R519 Headache, unspecified: Secondary | ICD-10-CM

## 2014-12-05 DIAGNOSIS — Z79899 Other long term (current) drug therapy: Secondary | ICD-10-CM | POA: Insufficient documentation

## 2014-12-05 DIAGNOSIS — Z7951 Long term (current) use of inhaled steroids: Secondary | ICD-10-CM | POA: Insufficient documentation

## 2014-12-05 DIAGNOSIS — Z87891 Personal history of nicotine dependence: Secondary | ICD-10-CM | POA: Insufficient documentation

## 2014-12-05 DIAGNOSIS — B349 Viral infection, unspecified: Secondary | ICD-10-CM

## 2014-12-05 DIAGNOSIS — Z862 Personal history of diseases of the blood and blood-forming organs and certain disorders involving the immune mechanism: Secondary | ICD-10-CM | POA: Insufficient documentation

## 2014-12-05 DIAGNOSIS — I120 Hypertensive chronic kidney disease with stage 5 chronic kidney disease or end stage renal disease: Secondary | ICD-10-CM | POA: Insufficient documentation

## 2014-12-05 DIAGNOSIS — Z992 Dependence on renal dialysis: Secondary | ICD-10-CM | POA: Insufficient documentation

## 2014-12-05 DIAGNOSIS — R51 Headache: Secondary | ICD-10-CM | POA: Insufficient documentation

## 2014-12-05 DIAGNOSIS — Z8739 Personal history of other diseases of the musculoskeletal system and connective tissue: Secondary | ICD-10-CM | POA: Insufficient documentation

## 2014-12-05 DIAGNOSIS — Z8639 Personal history of other endocrine, nutritional and metabolic disease: Secondary | ICD-10-CM | POA: Insufficient documentation

## 2014-12-05 DIAGNOSIS — N186 End stage renal disease: Secondary | ICD-10-CM | POA: Insufficient documentation

## 2014-12-05 LAB — COMPREHENSIVE METABOLIC PANEL
ALT: 10 U/L (ref 0–53)
AST: 14 U/L (ref 0–37)
Albumin: 3.5 g/dL (ref 3.5–5.2)
Alkaline Phosphatase: 63 U/L (ref 39–117)
Anion gap: 15 (ref 5–15)
BILIRUBIN TOTAL: 0.6 mg/dL (ref 0.3–1.2)
BUN: 27 mg/dL — AB (ref 6–23)
CO2: 29 mmol/L (ref 19–32)
Calcium: 8.3 mg/dL — ABNORMAL LOW (ref 8.4–10.5)
Chloride: 94 mmol/L — ABNORMAL LOW (ref 96–112)
Creatinine, Ser: 11.46 mg/dL — ABNORMAL HIGH (ref 0.50–1.35)
GFR calc non Af Amer: 5 mL/min — ABNORMAL LOW (ref >90)
GFR, EST AFRICAN AMERICAN: 6 mL/min — AB (ref >90)
Glucose, Bld: 101 mg/dL — ABNORMAL HIGH (ref 70–99)
POTASSIUM: 4.1 mmol/L (ref 3.5–5.1)
Sodium: 138 mmol/L (ref 135–145)
TOTAL PROTEIN: 8 g/dL (ref 6.0–8.3)

## 2014-12-05 LAB — CBC
HCT: 35.3 % — ABNORMAL LOW (ref 39.0–52.0)
HEMOGLOBIN: 11.5 g/dL — AB (ref 13.0–17.0)
MCH: 31.3 pg (ref 26.0–34.0)
MCHC: 32.6 g/dL (ref 30.0–36.0)
MCV: 95.9 fL (ref 78.0–100.0)
Platelets: 271 10*3/uL (ref 150–400)
RBC: 3.68 MIL/uL — AB (ref 4.22–5.81)
RDW: 13.3 % (ref 11.5–15.5)
WBC: 7.2 10*3/uL (ref 4.0–10.5)

## 2014-12-05 LAB — I-STAT CG4 LACTIC ACID, ED
LACTIC ACID, VENOUS: 1.59 mmol/L (ref 0.5–2.0)
LACTIC ACID, VENOUS: 1.07 mmol/L (ref 0.5–2.0)

## 2014-12-05 MED ORDER — SODIUM CHLORIDE 0.9 % IV BOLUS (SEPSIS)
250.0000 mL | Freq: Once | INTRAVENOUS | Status: AC
  Administered 2014-12-05: 250 mL via INTRAVENOUS

## 2014-12-05 MED ORDER — ACETAMINOPHEN 325 MG PO TABS
ORAL_TABLET | ORAL | Status: AC
  Filled 2014-12-05: qty 2

## 2014-12-05 MED ORDER — ACETAMINOPHEN 325 MG PO TABS
650.0000 mg | ORAL_TABLET | Freq: Once | ORAL | Status: AC
  Administered 2014-12-05: 650 mg via ORAL

## 2014-12-05 MED ORDER — DIPHENHYDRAMINE HCL 50 MG/ML IJ SOLN
12.5000 mg | Freq: Once | INTRAMUSCULAR | Status: AC
  Administered 2014-12-05: 12.5 mg via INTRAVENOUS
  Filled 2014-12-05: qty 1

## 2014-12-05 MED ORDER — METOCLOPRAMIDE HCL 5 MG/ML IJ SOLN
5.0000 mg | Freq: Once | INTRAMUSCULAR | Status: AC
  Administered 2014-12-05: 5 mg via INTRAVENOUS
  Filled 2014-12-05: qty 2

## 2014-12-05 NOTE — ED Provider Notes (Signed)
CSN: [id number]     Arrival date & time 12/05/14  1827 History   First MD Initiated Contact with Patient 12/05/14 1902     Chief Complaint  Patient presents with  . Fever     (Consider location/radiation/quality/duration/timing/severity/associated sxs/prior Treatment) Patient is a 44 y.o. male presenting with fever. The history is provided by the patient.  Fever Temp source:  Oral Severity:  Mild Onset quality:  Sudden Duration:  1 day Timing:  Constant Progression:  Waxing and waning Chronicity:  New Relieved by:  Nothing Worsened by:  Nothing tried Ineffective treatments:  None tried Associated symptoms: cough and headaches   Associated symptoms: no chest pain, no diarrhea, no dysuria, no nausea, no rhinorrhea and no vomiting   Cough:    Cough characteristics:  Non-productive   Severity:  Mild   Onset quality:  Sudden   Duration:  1 day   Timing:  Constant Headaches:    Severity:  Mild   Onset quality:  Gradual   Timing:  Constant   Progression:  Unchanged   Chronicity:  New   Past Medical History  Diagnosis Date  . ESRD on hemodialysis     HD  . Hypertension   . Anemia   . Thyroid disease   . Cough     DRY    . Headache(784.0)   . Muscle spasms of neck     BACK, NECK  . Depression    Past Surgical History  Procedure Laterality Date  . Hemodialysis graft to left arm and left leg    . Parathyroidectomy N/A 04/24/2014    Procedure: TOTAL 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;  Surgeon: [name], MD;  Location: [facility];  Service: General;  Laterality: N/A;  NECK AND LEFT FOREARM   No family history on file. History  Substance Use Topics  . Smoking status: Former Smoker    Quit date: 03/21/1986  . Smokeless tobacco: Never Used  . Alcohol Use: No    Review of Systems  Constitutional: Negative for fever.  HENT: Negative for drooling and rhinorrhea.   Eyes: Negative for pain.  Respiratory: Positive for cough. Negative for shortness of  breath.   Cardiovascular: Negative for chest pain and leg swelling.  Gastrointestinal: Negative for nausea, vomiting, abdominal pain and diarrhea.  Genitourinary: Negative for dysuria and hematuria.  Musculoskeletal: Negative for gait problem and neck pain.  Skin: Negative for color change.  Neurological: Positive for headaches. Negative for numbness.  Hematological: Negative for adenopathy.  Psychiatric/Behavioral: Negative for behavioral problems.  All other systems reviewed and are negative.     Allergies  Review of patient's allergies indicates no known allergies.  Home Medications   Prior to Admission medications   Medication Sig Start Date End Date Taking? Authorizing Provider  acetaminophen (TYLENOL) 500 MG tablet Take 1,000 mg by mouth every 6 (six) hours as needed for pain. For pain    Historical Provider, MD  amLODipine (NORVASC) 10 MG tablet Take 10 mg by mouth daily.    Historical Provider, MD  amoxicillin-clavulanate (AUGMENTIN) 875-125 MG per tablet Take 1 tablet by mouth 2 (two) times daily. One po bid x 7 days 07/14/14   Starlyn Skeans, PA-C  calcium carbonate (TUMS - DOSED IN MG ELEMENTAL CALCIUM) 500 MG chewable tablet Chew 2 tablets (400 mg of elemental calcium total) by mouth 2 (two) times daily between meals. 04/26/14   Doreen Salvage, MD  cloNIDine (CATAPRES) 0.2 MG tablet Take 0.2 mg by mouth daily.  Historical Provider, MD  fluticasone (FLONASE) 50 MCG/ACT nasal spray Place 2 sprays into both nostrils daily. 07/14/14   Courtney Forcucci, PA-C   BP 125/77 mmHg  Pulse 100  Temp(Src) 102.6 F (39.2 C) (Oral)  Resp 20  SpO2 99% Physical Exam  Constitutional: He is oriented to person, place, and time. He appears well-developed and well-nourished.  HENT:  Head: Normocephalic and atraumatic.  Right Ear: External ear normal.  Left Ear: External ear normal.  Nose: Nose normal.  Mouth/Throat: Oropharynx is clear and moist. No oropharyngeal exudate.  Eyes:  Conjunctivae and EOM are normal. Pupils are equal, round, and reactive to light.  Neck: Normal range of motion. Neck supple.  Cardiovascular: Regular rhythm, normal heart sounds and intact distal pulses.  Exam reveals no gallop and no friction rub.   No murmur heard. Pulmonary/Chest: Effort normal and breath sounds normal. No respiratory distress. He has no wheezes.  Abdominal: Soft. Bowel sounds are normal. He exhibits no distension. There is no tenderness. There is no rebound and no guarding.  Musculoskeletal: Normal range of motion. He exhibits no edema or tenderness.  HD graft in left upper extremity without evidence of infection.  Neurological: He is alert and oriented to person, place, and time.  alert, oriented x3 speech: normal in context and clarity memory: intact grossly cranial nerves II-XII: intact motor strength: full proximally and distally no involuntary movements or tremors sensation: intact to light touch diffusely  cerebellar: finger-to-nose and heel-to-shin intact gait: normal forwards and backwards   Skin: Skin is warm and dry.  Psychiatric: He has a normal mood and affect. His behavior is normal.  Nursing note and vitals reviewed.   ED Course  Procedures (including critical care time) Labs Review Labs Reviewed  CBC - Abnormal; Notable for the following:    RBC 3.68 (*)    Hemoglobin 11.5 (*)    HCT 35.3 (*)    All other components within normal limits  COMPREHENSIVE METABOLIC PANEL - Abnormal; Notable for the following:    Chloride 94 (*)    Glucose, Bld 101 (*)    BUN 27 (*)    Creatinine, Ser 11.46 (*)    Calcium 8.3 (*)    GFR calc non Af Amer 5 (*)    GFR calc Af Amer 6 (*)    All other components within normal limits  I-STAT CG4 LACTIC ACID, ED  I-STAT CG4 LACTIC ACID, ED    Imaging Review Dg Chest 2 View  12/05/2014   CLINICAL DATA:  Acute onset of cough and fever.  Initial encounter.  EXAM: CHEST  2 VIEW  COMPARISON:  Chest radiograph  performed 04/17/2014  FINDINGS: The lungs are well-aerated and clear. There is no evidence of focal opacification, pleural effusion or pneumothorax.  The heart is normal in size; the mediastinal contour is within normal limits. No acute osseous abnormalities are seen.  IMPRESSION: No acute cardiopulmonary process seen.   Electronically Signed   By: Garald Balding M.D.   On: 12/05/2014 20:17   Ct Head Wo Contrast  12/05/2014   CLINICAL DATA:  Headache with fever in generalized aches.  EXAM: CT HEAD WITHOUT CONTRAST  TECHNIQUE: Contiguous axial images were obtained from the base of the skull through the vertex without intravenous contrast.  COMPARISON:  None.  FINDINGS: Skull and Sinuses:There is complete opacification of the visible left maxillary sinus, with diffuse opacification and fluid levels in the left ethmoid sinuses. The left frontal sinus is spared.  Orbits: No  acute abnormality.  Brain: No evidence of acute infarction, hemorrhage, hydrocephalus, or mass lesion/mass effect. Generalized low cerebral volume for age.  IMPRESSION: 1. Left frontoethmoidal sinusitis. 2. No acute intracranial disease. 3. Low cerebral volume for age.   Electronically Signed   By: Monte Fantasia M.D.   On: 12/05/2014 21:31     EKG Interpretation None      MDM   Final diagnoses:  Cough  HA (headache)  Viral syndrome    7:43 PM 44 y.o. male w hx of ESRD on HD (MWF) who presents with fever, body aches which began today. He also notes a gradual onset headache which started during dialysis earlier today. He currently rates his headache a 4 out of 10. He has had a mild cough. He is febrile and mildly tachycardic here. Vital signs otherwise unremarkable. He has a normal neurologic exam and no neck rigidity. I suspect this is a simple viral syndrome. Will get screening chest x-ray and CT of head as his headache started during dialysis.  9:56 PM: HA improving. I interpreted/reviewed the labs and/or imaging which were  non-contributory.  I have discussed the diagnosis/risks/treatment options with the patient and believe the pt to be eligible for discharge home to follow-up with your pcp. We also discussed returning to the ED immediately if new or worsening sx occur. We discussed the sx which are most concerning (e.g., worsening HA, intractable fever, inability to tolerate po) that necessitate immediate return. Medications administered to the patient during their visit and any new prescriptions provided to the patient are listed below.  Medications given during this visit Medications  acetaminophen (TYLENOL) 325 MG tablet (not administered)  acetaminophen (TYLENOL) tablet 650 mg (650 mg Oral Given 12/05/14 1845)  sodium chloride 0.9 % bolus 250 mL (0 mLs Intravenous Stopped 12/05/14 2140)  metoCLOPramide (REGLAN) injection 5 mg (5 mg Intravenous Given 12/05/14 2034)  diphenhydrAMINE (BENADRYL) injection 12.5 mg (12.5 mg Intravenous Given 12/05/14 2033)    New Prescriptions   No medications on file     Pamella Pert, MD 12/05/14 2158

## 2014-12-05 NOTE — Discharge Instructions (Signed)
Take 600 mg of Tylenol every 6 hours for fever or body aches.

## 2014-12-05 NOTE — ED Notes (Signed)
Pt presents with a fever and generalized body aches that started this morning, pt was given antibiotics by dialysis MD this morning, pt has had 2 doses of antibiotics.  Pt had full treatment of dialysis today.

## 2014-12-05 NOTE — ED Notes (Signed)
Attempted IV access x2.  ?

## 2014-12-08 ENCOUNTER — Emergency Department (HOSPITAL_COMMUNITY)
Admission: EM | Admit: 2014-12-08 | Discharge: 2014-12-08 | Disposition: A | Discharge status: Home / Self Care | Payer: Medicare Other | Attending: Emergency Medicine | Admitting: Emergency Medicine

## 2014-12-08 ENCOUNTER — Other Ambulatory Visit: Payer: Self-pay

## 2014-12-08 ENCOUNTER — Emergency Department (HOSPITAL_COMMUNITY): Payer: Medicare Other

## 2014-12-08 ENCOUNTER — Encounter (HOSPITAL_COMMUNITY): Payer: Self-pay

## 2014-12-08 DIAGNOSIS — Z8659 Personal history of other mental and behavioral disorders: Secondary | ICD-10-CM | POA: Insufficient documentation

## 2014-12-08 DIAGNOSIS — Z79899 Other long term (current) drug therapy: Secondary | ICD-10-CM | POA: Insufficient documentation

## 2014-12-08 DIAGNOSIS — Z87891 Personal history of nicotine dependence: Secondary | ICD-10-CM | POA: Insufficient documentation

## 2014-12-08 DIAGNOSIS — R531 Weakness: Secondary | ICD-10-CM

## 2014-12-08 DIAGNOSIS — B349 Viral infection, unspecified: Secondary | ICD-10-CM

## 2014-12-08 DIAGNOSIS — N186 End stage renal disease: Secondary | ICD-10-CM

## 2014-12-08 DIAGNOSIS — Z862 Personal history of diseases of the blood and blood-forming organs and certain disorders involving the immune mechanism: Secondary | ICD-10-CM | POA: Insufficient documentation

## 2014-12-08 DIAGNOSIS — I120 Hypertensive chronic kidney disease with stage 5 chronic kidney disease or end stage renal disease: Secondary | ICD-10-CM | POA: Insufficient documentation

## 2014-12-08 DIAGNOSIS — Z992 Dependence on renal dialysis: Secondary | ICD-10-CM | POA: Insufficient documentation

## 2014-12-08 DIAGNOSIS — Z8639 Personal history of other endocrine, nutritional and metabolic disease: Secondary | ICD-10-CM | POA: Insufficient documentation

## 2014-12-08 DIAGNOSIS — Z8739 Personal history of other diseases of the musculoskeletal system and connective tissue: Secondary | ICD-10-CM | POA: Insufficient documentation

## 2014-12-08 LAB — COMPREHENSIVE METABOLIC PANEL
ALT: 31 U/L (ref 0–53)
AST: 44 U/L — ABNORMAL HIGH (ref 0–37)
Albumin: 3.3 g/dL — ABNORMAL LOW (ref 3.5–5.2)
Alkaline Phosphatase: 56 U/L (ref 39–117)
Anion gap: 18 — ABNORMAL HIGH (ref 5–15)
BUN: 81 mg/dL — ABNORMAL HIGH (ref 6–23)
CO2: 26 mmol/L (ref 19–32)
Calcium: 6.6 mg/dL — ABNORMAL LOW (ref 8.4–10.5)
Chloride: 93 mmol/L — ABNORMAL LOW (ref 96–112)
Creatinine, Ser: 22.85 mg/dL — ABNORMAL HIGH (ref 0.50–1.35)
GFR calc Af Amer: 2 mL/min — ABNORMAL LOW (ref >90)
GFR calc non Af Amer: 2 mL/min — ABNORMAL LOW (ref >90)
Glucose, Bld: 90 mg/dL (ref 70–99)
Potassium: 5.4 mmol/L — ABNORMAL HIGH (ref 3.5–5.1)
Sodium: 137 mmol/L (ref 135–145)
Total Bilirubin: 0.7 mg/dL (ref 0.3–1.2)
Total Protein: 7.1 g/dL (ref 6.0–8.3)

## 2014-12-08 LAB — CBC WITH DIFFERENTIAL/PLATELET
BASOS ABS: 0 10*3/uL (ref 0.0–0.1)
BASOS PCT: 0 % (ref 0–1)
Eosinophils Absolute: 0 10*3/uL (ref 0.0–0.7)
Eosinophils Relative: 1 % (ref 0–5)
HCT: 32.9 % — ABNORMAL LOW (ref 39.0–52.0)
HEMOGLOBIN: 10.6 g/dL — AB (ref 13.0–17.0)
Lymphocytes Relative: 28 % (ref 12–46)
Lymphs Abs: 0.7 10*3/uL (ref 0.7–4.0)
MCH: 30.6 pg (ref 26.0–34.0)
MCHC: 32.2 g/dL (ref 30.0–36.0)
MCV: 95.1 fL (ref 78.0–100.0)
Monocytes Absolute: 0.2 10*3/uL (ref 0.1–1.0)
Monocytes Relative: 10 % (ref 3–12)
Neutro Abs: 1.4 10*3/uL — ABNORMAL LOW (ref 1.7–7.7)
Neutrophils Relative %: 61 % (ref 43–77)
Platelets: 148 10*3/uL — ABNORMAL LOW (ref 150–400)
RBC: 3.46 MIL/uL — ABNORMAL LOW (ref 4.22–5.81)
RDW: 13.7 % (ref 11.5–15.5)
WBC: 2.4 10*3/uL — AB (ref 4.0–10.5)

## 2014-12-08 LAB — LIPASE, BLOOD: Lipase: 133 U/L — ABNORMAL HIGH (ref 11–59)

## 2014-12-08 MED ORDER — SODIUM POLYSTYRENE SULFONATE 15 GM/60ML PO SUSP
60.0000 g | Freq: Once | ORAL | Status: AC
  Administered 2014-12-08: 60 g via ORAL
  Filled 2014-12-08: qty 240

## 2014-12-08 MED ORDER — AZITHROMYCIN 250 MG PO TABS: 250.0000 mg | ORAL_TABLET | Freq: Every day | ORAL | Status: DC

## 2014-12-08 NOTE — ED Provider Notes (Signed)
CSN: [id number]     Arrival date & time 12/08/14  1310 History   First MD Initiated Contact with Patient 12/08/14 1503     Chief Complaint  Patient presents with  . Diarrhea     (Consider location/radiation/quality/duration/timing/severity/associated sxs/prior Treatment) HPI  Pt presenting with c/o generalized body aches and fatigue.  He was seen in the ED 3 days ago and diagnosed with viral syndrome.  He had fever at that time, but is no longer having fevers.  He had one episode of diarrhea today.  No chest pain or difficulty breathing.  No abdominal pain.  No vomiting.  He states he has been eating and drinking normally.  No specific sick contacts.  No recent travel.   He states he missed dialysis today.  His dialysis doctor gave him rx for zithromax on Friday- he took 2 doses of this and lost the rest.  He missed dialysis today because he wasn't feeling well. There are no other associated systemic symptoms, there are no other alleviating or modifying factors.   Past Medical History  Diagnosis Date  . ESRD on hemodialysis     HD  . Hypertension   . Anemia   . Thyroid disease   . Cough     DRY    . Headache(784.0)   . Muscle spasms of neck     BACK, NECK  . Depression    Past Surgical History  Procedure Laterality Date  . Hemodialysis graft to left arm and left leg    . Parathyroidectomy N/A 04/24/2014    Procedure: TOTAL 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;  Surgeon: [name], MD;  Location: [place];  Service: General;  Laterality: N/A;  NECK AND LEFT FOREARM   No family history on file. History  Substance Use Topics  . Smoking status: Former Smoker    Quit date: 03/21/1986  . Smokeless tobacco: Never Used  . Alcohol Use: No    Review of Systems  ROS reviewed and all otherwise negative except for mentioned in HPI    Allergies  Review of patient's allergies indicates no known allergies.  Home Medications   Prior to Admission medications   Medication  Sig Start Date End Date Taking? Authorizing Provider  acetaminophen (TYLENOL) 500 MG tablet Take 1,000 mg by mouth every 6 (six) hours as needed for pain. For pain   Yes Historical Provider, MD  amLODipine (NORVASC) 10 MG tablet Take 10 mg by mouth daily.   Yes Historical Provider, MD  amoxicillin-clavulanate (AUGMENTIN) 875-125 MG per tablet Take 1 tablet by mouth 2 (two) times daily. One po bid x 7 days 07/14/14  Yes Courtney Forcucci, PA-C  calcium carbonate (TUMS - DOSED IN MG ELEMENTAL CALCIUM) 500 MG chewable tablet Chew 2 tablets (400 mg of elemental calcium total) by mouth 2 (two) times daily between meals. 04/26/14  Yes Doreen Salvage, MD  cloNIDine (CATAPRES) 0.2 MG tablet Take 0.2 mg by mouth daily.    Yes Historical Provider, MD  azithromycin (ZITHROMAX) 250 MG tablet Take 1 tablet (250 mg total) by mouth daily. Take 1 po qD until finished 12/08/14   Alfonzo Beers, MD  fluticasone [facility]) 50 MCG/ACT nasal spray Place 2 sprays into both nostrils daily. Patient not taking: Reported on 12/05/2014 07/14/14   Loma Sousa Forcucci, PA-C   BP 114/68 mmHg  Pulse 75  Temp(Src) 98.9 F (37.2 C)  Resp 18  SpO2 93%  Vitals reviewed Physical Exam  Physical Examination: General appearance - alert, well appearing,  and in no distress Mental status - alert, oriented to person, place, and time Eyes - no conjunctival injection, no scleral icterus Mouth - mucous membranes moist, pharynx normal without lesions Chest - clear to auscultation, no wheezes, rales or rhonchi, symmetric air entry Heart - normal rate, regular rhythm, normal S1, S2, no murmurs, rubs, clicks or gallops Abdomen - soft, nontender, nondistended, no masses or organomegaly, nabs Extremities - peripheral pulses normal, no pedal edema, no clubbing or cyanosis, fistula in left upper extremities wtihout signs of infection Skin - normal coloration and turgor, no rashes  ED Course  Procedures (including critical care time) Labs Review Labs  Reviewed  CBC WITH DIFFERENTIAL/PLATELET - Abnormal; Notable for the following:    WBC 2.4 (*)    RBC 3.46 (*)    Hemoglobin 10.6 (*)    HCT 32.9 (*)    Platelets 148 (*)    Neutro Abs 1.4 (*)    All other components within normal limits  COMPREHENSIVE METABOLIC PANEL - Abnormal; Notable for the following:    Potassium 5.4 (*)    Chloride 93 (*)    BUN 81 (*)    Creatinine, Ser 22.85 (*)    Calcium 6.6 (*)    Albumin 3.3 (*)    AST 44 (*)    GFR calc non Af Amer 2 (*)    GFR calc Af Amer 2 (*)    Anion gap 18 (*)    All other components within normal limits  LIPASE, BLOOD - Abnormal; Notable for the following:    Lipase 133 (*)    All other components within normal limits  CULTURE, BLOOD (ROUTINE X 2)  CULTURE, BLOOD (ROUTINE X 2)    Imaging Review Dg Chest 2 View  12/08/2014   CLINICAL DATA:  Fever for 1 week. Diarrhea for 1 day. History of hypertension, end-stage renal disease on dialysis. Thyroid disease, cough.  EXAM: CHEST  2 VIEW  COMPARISON:  12/05/2014  FINDINGS: The heart size and mediastinal contours are within normal limits. Both lungs are clear. The visualized skeletal structures are unremarkable.  IMPRESSION: No active cardiopulmonary disease.   Electronically Signed   By: Nolon Nations M.D.   On: 12/08/2014 16:48     EKG Interpretation   Date/Time:  Monday December 08 2014 15:53:29 EST Ventricular Rate:  78 PR Interval:  168 QRS Duration: 77 QT Interval:  432 QTC Calculation: 492 R Axis:   37 Text Interpretation:  Sinus rhythm Paired ventricular premature complexes  LAE, consider biatrial enlargement Borderline prolonged QT interval  Baseline wander in lead(s) V2 No significant change since last tracing  Confirmed by [facility]  MD, Fransico Sciandra [phone]) on 12/08/2014 4:10:08 PM      MDM   Final diagnoses:  Viral syndrome  Weakness  End stage renal disease on dialysis    Pt presenting with ongoing fatigue and body aches- was seen in the ED with fever and  diagnosed with viral syndrome.  His symptoms have continued.  No ongoing fever however.  He took first days' dose of zithromax given by his renal physician and then lost the rest.  Labs were reassuring.  Some depression of WBC likely c/w viral syndrome.  CXR reassuring as well.  No signficnat changes on EKG with mild hyperkalemia- kayexalate given.  He has no pulmonary edema on CXR.  He had one episode of diarrhea today.  Discharged with strict return precautions.  Pt agreeable with plan.    Alfonzo Beers, MD 12/08/14 2211

## 2014-12-08 NOTE — Discharge Instructions (Signed)
Return to the ED with any concerns including difficulty breathing, chest pain, fainting, vomiting and not able to keep down liquids, fever/chills, decreased level of alertness/lethargy, or any other alarming symptoms

## 2014-12-08 NOTE — ED Notes (Signed)
Pt. Reports diarrhea and achy x1 week. States here last week for same and given antibiotics but he lost them. Dialysis patient, missed dialysis today. Last full dialysis was Friday.

## 2014-12-13 ENCOUNTER — Inpatient Hospital Stay (HOSPITAL_COMMUNITY)
Admission: EM | Admit: 2014-12-13 | Discharge: 2014-12-20 | DRG: 871 | Disposition: A | Discharge status: Home / Self Care | Payer: Medicare Other | Attending: Internal Medicine | Admitting: Internal Medicine

## 2014-12-13 ENCOUNTER — Encounter (HOSPITAL_COMMUNITY): Payer: Self-pay | Admitting: Emergency Medicine

## 2014-12-13 ENCOUNTER — Emergency Department (HOSPITAL_COMMUNITY): Payer: Medicare Other

## 2014-12-13 DIAGNOSIS — J189 Pneumonia, unspecified organism: Secondary | ICD-10-CM | POA: Diagnosis present

## 2014-12-13 DIAGNOSIS — M62838 Other muscle spasm: Secondary | ICD-10-CM | POA: Diagnosis present

## 2014-12-13 DIAGNOSIS — B1910 Unspecified viral hepatitis B without hepatic coma: Secondary | ICD-10-CM | POA: Diagnosis present

## 2014-12-13 DIAGNOSIS — F329 Major depressive disorder, single episode, unspecified: Secondary | ICD-10-CM | POA: Diagnosis present

## 2014-12-13 DIAGNOSIS — D631 Anemia in chronic kidney disease: Secondary | ICD-10-CM | POA: Diagnosis present

## 2014-12-13 DIAGNOSIS — N186 End stage renal disease: Secondary | ICD-10-CM

## 2014-12-13 DIAGNOSIS — Z992 Dependence on renal dialysis: Secondary | ICD-10-CM

## 2014-12-13 DIAGNOSIS — N2581 Secondary hyperparathyroidism of renal origin: Secondary | ICD-10-CM | POA: Diagnosis present

## 2014-12-13 DIAGNOSIS — E875 Hyperkalemia: Secondary | ICD-10-CM | POA: Diagnosis not present

## 2014-12-13 DIAGNOSIS — I959 Hypotension, unspecified: Secondary | ICD-10-CM | POA: Diagnosis present

## 2014-12-13 DIAGNOSIS — Z87891 Personal history of nicotine dependence: Secondary | ICD-10-CM

## 2014-12-13 DIAGNOSIS — I120 Hypertensive chronic kidney disease with stage 5 chronic kidney disease or end stage renal disease: Secondary | ICD-10-CM | POA: Diagnosis present

## 2014-12-13 DIAGNOSIS — A419 Sepsis, unspecified organism: Principal | ICD-10-CM | POA: Diagnosis present

## 2014-12-13 DIAGNOSIS — E892 Postprocedural hypoparathyroidism: Secondary | ICD-10-CM | POA: Diagnosis present

## 2014-12-13 DIAGNOSIS — Y95 Nosocomial condition: Secondary | ICD-10-CM | POA: Diagnosis present

## 2014-12-13 DIAGNOSIS — Z79899 Other long term (current) drug therapy: Secondary | ICD-10-CM

## 2014-12-13 DIAGNOSIS — R197 Diarrhea, unspecified: Secondary | ICD-10-CM | POA: Insufficient documentation

## 2014-12-13 LAB — LIPASE, BLOOD: Lipase: 43 U/L (ref 11–59)

## 2014-12-13 LAB — CBC WITH DIFFERENTIAL/PLATELET
Basophils Absolute: 0.1 10*3/uL (ref 0.0–0.1)
Basophils Relative: 1 % (ref 0–1)
EOS ABS: 0 10*3/uL (ref 0.0–0.7)
Eosinophils Relative: 0 % (ref 0–5)
HCT: 32.8 % — ABNORMAL LOW (ref 39.0–52.0)
HEMOGLOBIN: 10.5 g/dL — AB (ref 13.0–17.0)
LYMPHS ABS: 1 10*3/uL (ref 0.7–4.0)
Lymphocytes Relative: 12 % (ref 12–46)
MCH: 30.7 pg (ref 26.0–34.0)
MCHC: 32 g/dL (ref 30.0–36.0)
MCV: 95.9 fL (ref 78.0–100.0)
MONO ABS: 0.6 10*3/uL (ref 0.1–1.0)
Monocytes Relative: 7 % (ref 3–12)
NEUTROS ABS: 6.6 10*3/uL (ref 1.7–7.7)
Neutrophils Relative %: 80 % — ABNORMAL HIGH (ref 43–77)
PLATELETS: 141 10*3/uL — AB (ref 150–400)
RBC: 3.42 MIL/uL — AB (ref 4.22–5.81)
RDW: 14.1 % (ref 11.5–15.5)
WBC: 8.3 10*3/uL (ref 4.0–10.5)

## 2014-12-13 LAB — COMPREHENSIVE METABOLIC PANEL
ALT: 22 U/L (ref 0–53)
AST: 35 U/L (ref 0–37)
Albumin: 3.2 g/dL — ABNORMAL LOW (ref 3.5–5.2)
Alkaline Phosphatase: 52 U/L (ref 39–117)
Anion gap: 25 — ABNORMAL HIGH (ref 5–15)
BUN: 77 mg/dL — ABNORMAL HIGH (ref 6–23)
CHLORIDE: 94 mmol/L — AB (ref 96–112)
CO2: 21 mmol/L (ref 19–32)
CREATININE: 24.94 mg/dL — AB (ref 0.50–1.35)
Calcium: 6.5 mg/dL — ABNORMAL LOW (ref 8.4–10.5)
GFR calc Af Amer: 2 mL/min — ABNORMAL LOW (ref >90)
GFR calc non Af Amer: 2 mL/min — ABNORMAL LOW (ref >90)
GLUCOSE: 90 mg/dL (ref 70–99)
POTASSIUM: 4.9 mmol/L (ref 3.5–5.1)
Sodium: 140 mmol/L (ref 135–145)
TOTAL PROTEIN: 8 g/dL (ref 6.0–8.3)
Total Bilirubin: 1 mg/dL (ref 0.3–1.2)

## 2014-12-13 MED ORDER — HYDROMORPHONE HCL 1 MG/ML IJ SOLN
1.0000 mg | Freq: Once | INTRAMUSCULAR | Status: AC
  Administered 2014-12-13: 1 mg via INTRAVENOUS
  Filled 2014-12-13: qty 1

## 2014-12-13 MED ORDER — IOHEXOL 300 MG/ML  SOLN: 25.0000 mL | Freq: Once | INTRAMUSCULAR | Status: AC | PRN

## 2014-12-13 NOTE — ED Notes (Signed)
Pt does not produce urine

## 2014-12-13 NOTE — ED Notes (Addendum)
Pt c/o fatigue since last Saturday. Pt with dry lips and generalized weakness. Dialysis pt, MWF. Last treatment Wednesday.

## 2014-12-13 NOTE — ED Notes (Signed)
Pt does not make urine pt is a dialysis patient.

## 2014-12-14 DIAGNOSIS — N186 End stage renal disease: Secondary | ICD-10-CM

## 2014-12-14 DIAGNOSIS — Z992 Dependence on renal dialysis: Secondary | ICD-10-CM

## 2014-12-14 DIAGNOSIS — I120 Hypertensive chronic kidney disease with stage 5 chronic kidney disease or end stage renal disease: Secondary | ICD-10-CM | POA: Diagnosis present

## 2014-12-14 DIAGNOSIS — E892 Postprocedural hypoparathyroidism: Secondary | ICD-10-CM | POA: Diagnosis present

## 2014-12-14 DIAGNOSIS — Z87891 Personal history of nicotine dependence: Secondary | ICD-10-CM | POA: Diagnosis not present

## 2014-12-14 DIAGNOSIS — M62838 Other muscle spasm: Secondary | ICD-10-CM | POA: Diagnosis present

## 2014-12-14 DIAGNOSIS — D631 Anemia in chronic kidney disease: Secondary | ICD-10-CM | POA: Diagnosis present

## 2014-12-14 DIAGNOSIS — F329 Major depressive disorder, single episode, unspecified: Secondary | ICD-10-CM | POA: Diagnosis present

## 2014-12-14 DIAGNOSIS — N2581 Secondary hyperparathyroidism of renal origin: Secondary | ICD-10-CM | POA: Diagnosis present

## 2014-12-14 DIAGNOSIS — A419 Sepsis, unspecified organism: Secondary | ICD-10-CM | POA: Diagnosis present

## 2014-12-14 DIAGNOSIS — B1910 Unspecified viral hepatitis B without hepatic coma: Secondary | ICD-10-CM | POA: Diagnosis present

## 2014-12-14 DIAGNOSIS — J189 Pneumonia, unspecified organism: Secondary | ICD-10-CM | POA: Diagnosis present

## 2014-12-14 DIAGNOSIS — I959 Hypotension, unspecified: Secondary | ICD-10-CM | POA: Diagnosis present

## 2014-12-14 DIAGNOSIS — Z79899 Other long term (current) drug therapy: Secondary | ICD-10-CM | POA: Diagnosis not present

## 2014-12-14 DIAGNOSIS — I9589 Other hypotension: Secondary | ICD-10-CM

## 2014-12-14 DIAGNOSIS — R197 Diarrhea, unspecified: Secondary | ICD-10-CM | POA: Insufficient documentation

## 2014-12-14 DIAGNOSIS — Y95 Nosocomial condition: Secondary | ICD-10-CM | POA: Diagnosis present

## 2014-12-14 DIAGNOSIS — E875 Hyperkalemia: Secondary | ICD-10-CM | POA: Diagnosis not present

## 2014-12-14 LAB — BASIC METABOLIC PANEL
Anion gap: 22 — ABNORMAL HIGH (ref 5–15)
BUN: 85 mg/dL — ABNORMAL HIGH (ref 6–23)
CO2: 23 mmol/L (ref 19–32)
Calcium: 6.2 mg/dL — CL (ref 8.4–10.5)
Chloride: 95 mmol/L — ABNORMAL LOW (ref 96–112)
Creatinine, Ser: 24.99 mg/dL — ABNORMAL HIGH (ref 0.50–1.35)
GFR calc Af Amer: 2 mL/min — ABNORMAL LOW (ref >90)
GFR calc non Af Amer: 2 mL/min — ABNORMAL LOW (ref >90)
Glucose, Bld: 93 mg/dL (ref 70–99)
Potassium: 5.7 mmol/L — ABNORMAL HIGH (ref 3.5–5.1)
SODIUM: 140 mmol/L (ref 135–145)

## 2014-12-14 LAB — CBC
HEMATOCRIT: 33.2 % — AB (ref 39.0–52.0)
Hemoglobin: 10.3 g/dL — ABNORMAL LOW (ref 13.0–17.0)
MCH: 30 pg (ref 26.0–34.0)
MCHC: 31 g/dL (ref 30.0–36.0)
MCV: 96.8 fL (ref 78.0–100.0)
Platelets: 161 10*3/uL (ref 150–400)
RBC: 3.43 MIL/uL — ABNORMAL LOW (ref 4.22–5.81)
RDW: 14.4 % (ref 11.5–15.5)
WBC: 9.2 10*3/uL (ref 4.0–10.5)

## 2014-12-14 LAB — INFLUENZA PANEL BY PCR (TYPE A & B)
H1N1 flu by pcr: NOT DETECTED
Influenza A By PCR: NEGATIVE
Influenza B By PCR: NEGATIVE

## 2014-12-14 LAB — I-STAT CG4 LACTIC ACID, ED: Lactic Acid, Venous: 1.39 mmol/L (ref 0.5–2.0)

## 2014-12-14 LAB — CLOSTRIDIUM DIFFICILE BY PCR: CDIFFPCR: NEGATIVE

## 2014-12-14 MED ORDER — VANCOMYCIN HCL IN DEXTROSE 750-5 MG/150ML-% IV SOLN: 750.0000 mg | INTRAVENOUS | Status: DC

## 2014-12-14 MED ORDER — OXYCODONE HCL 5 MG PO TABS
5.0000 mg | ORAL_TABLET | ORAL | Status: DC | PRN
  Administered 2014-12-15 – 2014-12-19 (×5): 5 mg via ORAL
  Filled 2014-12-14 (×5): qty 1

## 2014-12-14 MED ORDER — SODIUM CHLORIDE 0.9 % IJ SOLN
3.0000 mL | Freq: Two times a day (BID) | INTRAMUSCULAR | Status: DC
  Administered 2014-12-14 – 2014-12-18 (×6): 3 mL via INTRAVENOUS

## 2014-12-14 MED ORDER — HYDROMORPHONE HCL 1 MG/ML IJ SOLN: 0.5000 mg | INTRAMUSCULAR | Status: DC | PRN

## 2014-12-14 MED ORDER — DEXTROSE 5 % IV SOLN
2.0000 g | Freq: Once | INTRAVENOUS | Status: AC
  Administered 2014-12-15: 2 g via INTRAVENOUS
  Filled 2014-12-14: qty 2

## 2014-12-14 MED ORDER — ACETAMINOPHEN 650 MG RE SUPP: 650.0000 mg | Freq: Four times a day (QID) | RECTAL | Status: DC | PRN

## 2014-12-14 MED ORDER — DEXTROSE 5 % IV SOLN
1.0000 g | Freq: Once | INTRAVENOUS | Status: AC
  Administered 2014-12-14: 1 g via INTRAVENOUS
  Filled 2014-12-14: qty 1

## 2014-12-14 MED ORDER — SODIUM CHLORIDE 0.9 % IJ SOLN: 3.0000 mL | INTRAMUSCULAR | Status: DC | PRN

## 2014-12-14 MED ORDER — ONDANSETRON HCL 4 MG/2ML IJ SOLN
4.0000 mg | Freq: Once | INTRAMUSCULAR | Status: AC
  Administered 2014-12-14: 4 mg via INTRAVENOUS
  Filled 2014-12-14: qty 2

## 2014-12-14 MED ORDER — SODIUM CHLORIDE 0.9 % IV SOLN: 250.0000 mL | INTRAVENOUS | Status: DC | PRN

## 2014-12-14 MED ORDER — SODIUM CHLORIDE 0.9 % IV SOLN
1.0000 g | Freq: Once | INTRAVENOUS | Status: AC
  Administered 2014-12-14: 1 g via INTRAVENOUS
  Filled 2014-12-14: qty 10

## 2014-12-14 MED ORDER — DEXTROSE 5 % IV SOLN: 2.0000 g | INTRAVENOUS | Status: DC

## 2014-12-14 MED ORDER — ONDANSETRON HCL 4 MG/2ML IJ SOLN: 4.0000 mg | Freq: Four times a day (QID) | INTRAMUSCULAR | Status: DC | PRN

## 2014-12-14 MED ORDER — HEPARIN SODIUM (PORCINE) 5000 UNIT/ML IJ SOLN
5000.0000 [IU] | Freq: Three times a day (TID) | INTRAMUSCULAR | Status: DC
  Filled 2014-12-14 (×5): qty 1

## 2014-12-14 MED ORDER — CALCIUM CARBONATE ANTACID 500 MG PO CHEW
2.0000 | CHEWABLE_TABLET | Freq: Two times a day (BID) | ORAL | Status: DC
  Administered 2014-12-14 – 2014-12-18 (×9): 400 mg via ORAL
  Filled 2014-12-14 (×12): qty 1

## 2014-12-14 MED ORDER — ACETAMINOPHEN 325 MG PO TABS
650.0000 mg | ORAL_TABLET | Freq: Four times a day (QID) | ORAL | Status: DC | PRN
  Administered 2014-12-15 – 2014-12-19 (×5): 650 mg via ORAL
  Filled 2014-12-14 (×3): qty 2

## 2014-12-14 MED ORDER — VANCOMYCIN HCL IN DEXTROSE 750-5 MG/150ML-% IV SOLN
750.0000 mg | Freq: Once | INTRAVENOUS | Status: AC
  Administered 2014-12-15: 750 mg via INTRAVENOUS
  Filled 2014-12-14 (×2): qty 150

## 2014-12-14 MED ORDER — VANCOMYCIN HCL 10 G IV SOLR
1500.0000 mg | Freq: Once | INTRAVENOUS | Status: AC
  Administered 2014-12-14: 1500 mg via INTRAVENOUS
  Filled 2014-12-14: qty 1500

## 2014-12-14 MED ORDER — ONDANSETRON HCL 4 MG PO TABS: 4.0000 mg | ORAL_TABLET | Freq: Four times a day (QID) | ORAL | Status: DC | PRN

## 2014-12-14 NOTE — Progress Notes (Addendum)
PATIENT DETAILS Name: [name] Age: 44 y.o. Sex: male Date of Birth: 02-20-1971 Admit Date: 12/13/2014 Admitting Physician Theressa Millard, MD PCP:No PCP Per Patient  Subjective: No major issues overnight  Assessment/Plan: Principal Problem:   HCAP (healthcare-associated pneumonia): Admitted and started on vancomycin and cefepime. Low-grade fever overnight, essentially unchanged compared to admission. Follow culture data   Active Problems:   Sepsis: Secondary to above. Blood pressure now stable. Continue to hold antihypertensives.Continue Abx    End-stage renal disease: On hemodialysis M,W,F. Missed last dialysis on Friday, the full with mild hyperkalemia. Have already consulted nephrology.     Mild hyperkalemia: Likely will require hemodialysis. Defer to nephrology.    Diarrhea: C. difficile PCR negative. Supportive care for now.    Hypocalcemia: Defer to nephrology.    Hypertension: Stable, continue to hold oral antihypertensive agents.  Disposition: Remain inpatient  Antibiotics:  See below   Anti-infectives    Start     Dose/Rate Route Frequency Ordered Stop   12/15/14 1800  ceFEPIme (MAXIPIME) 2 g in dextrose 5 % 50 mL IVPB     2 g 100 mL/hr over 30 Minutes Intravenous Every M-W-F (1800) 12/14/14 0752     12/15/14 1200  vancomycin (VANCOCIN) IVPB 750 mg/150 ml premix     750 mg 150 mL/hr over 60 Minutes Intravenous Every M-W-F (Hemodialysis) 12/14/14 0752     12/14/14 0100  vancomycin (VANCOCIN) 1,500 mg in sodium chloride 0.9 % 500 mL IVPB     1,500 mg 250 mL/hr over 120 Minutes Intravenous  Once 12/14/14 0017 12/14/14 0321   12/14/14 0015  ceFEPIme (MAXIPIME) 1 g in dextrose 5 % 50 mL IVPB     1 g 100 mL/hr over 30 Minutes Intravenous  Once 12/14/14 0009 12/14/14 0118      DVT Prophylaxis: Prophylactic  Heparin   Code Status: Full code  Family Communication None at bedside  Procedures:  None  CONSULTS:   nephrology  Time spent 40 minutes-which includes 50% of the time with face-to-face with patient/ family and coordinating care related to the above assessment and plan.  MEDICATIONS: Scheduled Meds: . calcium carbonate  2 tablet Oral BID BM  . [START ON 12/15/2014] ceFEPime (MAXIPIME) IV  2 g Intravenous Q M,W,F-1800  . heparin  5,000 Units Subcutaneous 3 times per day  . sodium chloride  3 mL Intravenous Q12H  . [START ON 12/15/2014] vancomycin  750 mg Intravenous Q M,W,F-HD   Continuous Infusions:  PRN Meds:.sodium chloride, acetaminophen **OR** acetaminophen, HYDROmorphone (DILAUDID) injection, ondansetron **OR** ondansetron (ZOFRAN) IV, oxyCODONE, sodium chloride    PHYSICAL EXAM: Vital signs in last 24 hours: Filed Vitals:   12/14/14 0100 12/14/14 0148 12/14/14 0450 12/14/14 0957  BP: 99/58 111/77 121/71 122/88  Pulse: 81 87 78 77  Temp:  98.8 F (37.1 C) 98.6 F (37 C) 98.3 F (36.8 C)  TempSrc:  Oral Oral Oral  Resp: 21 19 19 18   Weight:  66.906 kg (147 lb 8 oz)    SpO2: 94% 94% 100% 97%    Weight change:  Filed Weights   12/14/14 0148  Weight: 66.906 kg (147 lb 8 oz)   Body mass index is 20.58 kg/(m^2).   Gen Exam: Awake and alert with clear speech.   Neck: Supple, No JVD.   Chest: B/L Clear.   CVS: S1 S2 Regular, no murmurs.  Abdomen: soft, BS +, non tender, non distended.  Extremities: no edema, lower  extremities warm to touch. Neurologic: Non Focal.   Skin: No Rash.   Wounds: N/A.    Intake/Output from previous day:  Intake/Output Summary (Last 24 hours) at 12/14/14 1321 Last data filed at 12/14/14 0540  Gross per 24 hour  Intake    610 ml  Output      0 ml  Net    610 ml     LAB RESULTS: CBC  Recent Labs Lab 12/08/14 1351 12/13/14 1718 12/14/14 0540  WBC 2.4* 8.3 9.2  HGB 10.6* 10.5* 10.3*  HCT 32.9* 32.8* 33.2*  PLT 148* 141* 161  MCV 95.1 95.9 96.8  MCH 30.6 30.7 30.0  MCHC 32.2 32.0 31.0  RDW 13.7 14.1 14.4  LYMPHSABS 0.7 1.0   --   MONOABS 0.2 0.6  --   EOSABS 0.0 0.0  --   BASOSABS 0.0 0.1  --     Chemistries   Recent Labs Lab 12/08/14 1351 12/13/14 1718 12/14/14 0540  NA 137 140 140  K 5.4* 4.9 5.7*  CL 93* 94* 95*  CO2 26 21 23   GLUCOSE 90 90 93  BUN 81* 77* 85*  CREATININE 22.85* 24.94* 24.99*  CALCIUM 6.6* 6.5* 6.2*    CBG: No results for input(s): GLUCAP in the last 168 hours.  GFR CrCl cannot be calculated (Unknown ideal weight.).  Coagulation profile No results for input(s): INR, PROTIME in the last 168 hours.  Cardiac Enzymes No results for input(s): CKMB, TROPONINI, MYOGLOBIN in the last 168 hours.  Invalid input(s): CK  Invalid input(s): POCBNP No results for input(s): DDIMER in the last 72 hours. No results for input(s): HGBA1C in the last 72 hours. No results for input(s): CHOL, HDL, LDLCALC, TRIG, CHOLHDL, LDLDIRECT in the last 72 hours. No results for input(s): TSH, T4TOTAL, T3FREE, THYROIDAB in the last 72 hours.  Invalid input(s): FREET3 No results for input(s): VITAMINB12, FOLATE, FERRITIN, TIBC, IRON, RETICCTPCT in the last 72 hours.  Recent Labs  12/13/14 1718  LIPASE 43    Urine Studies No results for input(s): UHGB, CRYS in the last 72 hours.  Invalid input(s): UACOL, UAPR, USPG, UPH, UTP, UGL, UKET, UBIL, UNIT, UROB, ULEU, UEPI, UWBC, URBC, UBAC, CAST, UCOM, BILUA  MICROBIOLOGY: Recent Results (from the past 240 hour(s))  Blood culture (routine x 2)     Status: None (Preliminary result)   Collection Time: 12/08/14  5:43 PM  Result Value Ref Range Status   Specimen Description BLOOD ARM RIGHT  Final   Special Requests BOTTLES DRAWN AEROBIC AND ANAEROBIC 5CC  Final   Culture   Final           BLOOD CULTURE RECEIVED NO GROWTH TO DATE CULTURE WILL BE HELD FOR 5 DAYS BEFORE ISSUING A FINAL NEGATIVE REPORT Performed at [facility]    Report Status PENDING  Incomplete  Blood culture (routine x 2)     Status: None (Preliminary result)    Collection Time: 12/08/14  5:50 PM  Result Value Ref Range Status   Specimen Description BLOOD HAND RIGHT  Final   Special Requests BOTTLES DRAWN AEROBIC AND ANAEROBIC 5CC  Final   Culture   Final           BLOOD CULTURE RECEIVED NO GROWTH TO DATE CULTURE WILL BE HELD FOR 5 DAYS BEFORE ISSUING A FINAL NEGATIVE REPORT Performed at [facility]    Report Status PENDING  Incomplete  Clostridium Difficile by PCR     Status: None   Collection  Time: 12/14/14  4:34 AM  Result Value Ref Range Status   C difficile by pcr NEGATIVE NEGATIVE Final    RADIOLOGY STUDIES/RESULTS: Dg Chest 2 View  12/13/2014   CLINICAL DATA:  Acute onset of fatigue. Dry lips and generalized weakness. Initial encounter.  EXAM: CHEST  2 VIEW  COMPARISON:  Chest radiograph performed 12/08/2014  FINDINGS: The lungs are well-aerated. Mild bibasilar airspace opacities, left greater than right, raise concern for pneumonia. This is particularly evident on the lateral view. There is no evidence of pleural effusion or pneumothorax.  The heart is normal in size; the mediastinal contour is within normal limits. No acute osseous abnormalities are seen.  IMPRESSION: Bibasilar pneumonia noted, more prominent on the left.   Electronically Signed   By: Garald Balding M.D.   On: 12/13/2014 23:23   Dg Chest 2 View  12/08/2014   CLINICAL DATA:  Fever for 1 week. Diarrhea for 1 day. History of hypertension, end-stage renal disease on dialysis. Thyroid disease, cough.  EXAM: CHEST  2 VIEW  COMPARISON:  12/05/2014  FINDINGS: The heart size and mediastinal contours are within normal limits. Both lungs are clear. The visualized skeletal structures are unremarkable.  IMPRESSION: No active cardiopulmonary disease.   Electronically Signed   By: Nolon Nations M.D.   On: 12/08/2014 16:48   Dg Chest 2 View  12/05/2014   CLINICAL DATA:  Acute onset of cough and fever.  Initial encounter.  EXAM: CHEST  2 VIEW  COMPARISON:  Chest radiograph performed  04/17/2014  FINDINGS: The lungs are well-aerated and clear. There is no evidence of focal opacification, pleural effusion or pneumothorax.  The heart is normal in size; the mediastinal contour is within normal limits. No acute osseous abnormalities are seen.  IMPRESSION: No acute cardiopulmonary process seen.   Electronically Signed   By: Garald Balding M.D.   On: 12/05/2014 20:17   Ct Head Wo Contrast  12/05/2014   CLINICAL DATA:  Headache with fever in generalized aches.  EXAM: CT HEAD WITHOUT CONTRAST  TECHNIQUE: Contiguous axial images were obtained from the base of the skull through the vertex without intravenous contrast.  COMPARISON:  None.  FINDINGS: Skull and Sinuses:There is complete opacification of the visible left maxillary sinus, with diffuse opacification and fluid levels in the left ethmoid sinuses. The left frontal sinus is spared.  Orbits: No acute abnormality.  Brain: No evidence of acute infarction, hemorrhage, hydrocephalus, or mass lesion/mass effect. Generalized low cerebral volume for age.  IMPRESSION: 1. Left frontoethmoidal sinusitis. 2. No acute intracranial disease. 3. Low cerebral volume for age.   Electronically Signed   By: Monte Fantasia M.D.   On: 12/05/2014 21:31    Oren Binet, MD  [facility] Pager:[phone]  If 7PM-7AM, please contact night-coverage [email or web address] Password [email or web address] 12/14/2014, 1:21 PM   LOS: 0 days

## 2014-12-14 NOTE — Progress Notes (Signed)
UR Completed.  [phone]  

## 2014-12-14 NOTE — ED Notes (Signed)
Attempted report 

## 2014-12-14 NOTE — Progress Notes (Signed)
CRITICAL VALUE ALERT  Critical value received: Calcium  6.2  Date of notification:  12/14/2014  Time of notification:  0800  Critical value read backyesNurse who received alert:  Hulan Amato, RN  MD notified (1st page):  Ghimire  Time of first page:  0825  MD notified (2nd page):  Time of second page:  Responding MD:  Sloan Leiter  Time MD responded:  [id number]  Lab was drawn prior to pt receiving Ca gluconate at 0540.

## 2014-12-14 NOTE — H&P (Addendum)
[facility] Admission History and Physical       [name] [id number] DOB: Oct 30, 1970 DOA: 12/13/2014  Referring physician:  EDP  PCP: No PCP Per Patient  Specialists:   Chief Complaint: SOB, Cough and Fevers and Chills  HPI: [name] is a 44 y.o. male with a history of ESRD on HD (MWF) who presents to the ED with complaints of worsening SOB, and increased cough x close to 2 weeks.  He was seen in the ED on 03/04, and 03/07 and diagnosed with a viral illness and was given Azithromycin on the 03/07 visit.  He returned due to continue worsening and a chest x-ray revealed bibasilar  Pneumonia, and he was placed on IV Antibiotics to cover HCAP.    He also has had some loose stools.    Review of Systems:  Constitutional: No Weight Loss, No Weight Gain, Night Sweats, +Fevers, +Chills, Dizziness, Light Headedness, Fatigue, +Malaise, or Generalized Weakness HEENT: No Headaches, Difficulty Swallowing,Tooth/Dental Problems,Sore Throat,  No Sneezing, Rhinitis, Ear Ache, Nasal Congestion, or Post Nasal Drip,  Cardio-vascular:  No Chest pain, Orthopnea, PND, Edema in Lower Extremities, Anasarca, Dizziness, Palpitations  Resp: No Dyspnea, No DOE, No Productive Cough, No Non-Productive Cough, No Hemoptysis, No Wheezing.    GI: No Heartburn, Indigestion, Abdominal Pain, Nausea, Vomiting, Diarrhea, Constipation, Hematemesis, Hematochezia, Melena, Change in Bowel Habits,  Loss of Appetite  GU: No Dysuria, No Change in Color of Urine, No Urgency or Urinary Frequency, No Flank pain.  Musculoskeletal: No Joint Pain or Swelling, No Decreased Range of Motion, No Back Pain.  Neurologic: No Syncope, No Seizures, Muscle Weakness, Paresthesia, Vision Disturbance or Loss, No Diplopia, No Vertigo, No Difficulty Walking,  Skin: No Rash or Lesions. Psych: No Change in Mood or Affect, No Depression or Anxiety, No Memory loss, No Confusion, or Hallucinations   Past Medical History    Diagnosis Date  . ESRD on hemodialysis     HD  . Hypertension   . Anemia   . Thyroid disease   . Cough     DRY    . Headache(784.0)   . Muscle spasms of neck     BACK, NECK  . Depression      Past Surgical History  Procedure Laterality Date  . Hemodialysis graft to left arm and left leg    . Parathyroidectomy N/A 04/24/2014    Procedure: TOTAL 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;  Surgeon: [name], MD;  Location: [facility];  Service: General;  Laterality: N/A;  NECK AND LEFT FOREARM      Prior to Admission medications   Medication Sig Start Date End Date Taking? Authorizing Provider  acetaminophen (TYLENOL) 500 MG tablet Take 1,000 mg by mouth every 6 (six) hours as needed for pain. For pain   Yes Historical Provider, MD  amLODipine (NORVASC) 10 MG tablet Take 10 mg by mouth daily.   Yes Historical Provider, MD  calcium carbonate (TUMS - DOSED IN MG ELEMENTAL CALCIUM) 500 MG chewable tablet Chew 2 tablets (400 mg of elemental calcium total) by mouth 2 (two) times daily between meals. 04/26/14  Yes Doreen Salvage, MD  cloNIDine (CATAPRES) 0.2 MG tablet Take 0.2 mg by mouth daily.    Yes Historical Provider, MD  amoxicillin-clavulanate (AUGMENTIN) 875-125 MG per tablet Take 1 tablet by mouth 2 (two) times daily. One po bid x 7 days 07/14/14   Starlyn Skeans, PA-C  azithromycin (ZITHROMAX) 250 MG tablet Take 1 tablet (250 mg total) by  mouth daily. Take 1 po qD until finished Patient not taking: Reported on 12/13/2014 12/08/14   Alfonzo Beers, MD  fluticasone [facility]) 50 MCG/ACT nasal spray Place 2 sprays into both nostrils daily. Patient not taking: Reported on 12/05/2014 07/14/14   Starlyn Skeans, PA-C     No Known Allergies  Social History:  reports that he quit smoking about 28 years ago. He has never used smokeless tobacco. He reports that he does not drink alcohol or use illicit drugs.    No family history on file.     Physical Exam:  GEN:  Pleasant Thin 44  y.o. African male examined and in no acute distress; cooperative with exam Filed Vitals:   12/13/14 2356 12/14/14 0000 12/14/14 0100 12/14/14 0148  BP: 121/76 93/56 99/58  111/77  Pulse: 84 87 81 87  Temp:    98.8 F (37.1 C)  TempSrc: Oral   Oral  Resp: 16 17 21 19   Weight:    66.906 kg (147 lb 8 oz)  SpO2: 100% 100% 94% 94%   Blood pressure 111/77, pulse 87, temperature 98.8 F (37.1 C), temperature source Oral, resp. rate 19, weight 66.906 kg (147 lb 8 oz), SpO2 94 %. PSYCH: He is alert and oriented x4; does not appear anxious does not appear depressed; affect is normal HEENT: Normocephalic and Atraumatic, Mucous membranes pink; PERRLA; EOM intact; Fundi:  Benign;  No scleral icterus, Nares: Patent, Oropharynx: Clear, Fair Dentition,    Neck:  FROM, No Cervical Lymphadenopathy nor Thyromegaly or Carotid Bruit; No JVD; Breasts:: Not examined CHEST WALL: No tenderness CHEST: Normal respiration, Rhonchorous Breath sounds, No wheezes, No Rales,   HEART: Regular rate and rhythm; no murmurs rubs or gallops BACK: No kyphosis or scoliosis; No CVA tenderness ABDOMEN: Positive Bowel Sounds, Scaphoid, Soft Non-Tender, No Rebound or Guarding; No Masses, No Organomegaly. Rectal Exam: Not done EXTREMITIES: No Cyanosis, Clubbing, or Edema; No Ulcerations. Genitalia: not examined PULSES: 2+ and symmetric SKIN: Normal hydration no rash or ulceration CNS:  Alert and Oriented x 4, No Focal Deficits Vascular: pulses palpable throughout    Labs on Admission:  Basic Metabolic Panel:  Recent Labs Lab 12/08/14 1351 12/13/14 1718  NA 137 140  K 5.4* 4.9  CL 93* 94*  CO2 26 21  GLUCOSE 90 90  BUN 81* 77*  CREATININE 22.85* 24.94*  CALCIUM 6.6* 6.5*   Liver Function Tests:  Recent Labs Lab 12/08/14 1351 12/13/14 1718  AST 44* 35  ALT 31 22  ALKPHOS 56 52  BILITOT 0.7 1.0  PROT 7.1 8.0  ALBUMIN 3.3* 3.2*    Recent Labs Lab 12/08/14 1351 12/13/14 1718  LIPASE 133* 43   No  results for input(s): AMMONIA in the last 168 hours. CBC:  Recent Labs Lab 12/08/14 1351 12/13/14 1718  WBC 2.4* 8.3  NEUTROABS 1.4* 6.6  HGB 10.6* 10.5*  HCT 32.9* 32.8*  MCV 95.1 95.9  PLT 148* 141*   Cardiac Enzymes: No results for input(s): CKTOTAL, CKMB, CKMBINDEX, TROPONINI in the last 168 hours.  BNP (last 3 results) No results for input(s): BNP in the last 8760 hours.  ProBNP (last 3 results) No results for input(s): PROBNP in the last 8760 hours.  CBG: No results for input(s): GLUCAP in the last 168 hours.  Radiological Exams on Admission: Dg Chest 2 View  12/13/2014   CLINICAL DATA:  Acute onset of fatigue. Dry lips and generalized weakness. Initial encounter.  EXAM: CHEST  2 VIEW  COMPARISON:  Chest radiograph  performed 12/08/2014  FINDINGS: The lungs are well-aerated. Mild bibasilar airspace opacities, left greater than right, raise concern for pneumonia. This is particularly evident on the lateral view. There is no evidence of pleural effusion or pneumothorax.  The heart is normal in size; the mediastinal contour is within normal limits. No acute osseous abnormalities are seen.  IMPRESSION: Bibasilar pneumonia noted, more prominent on the left.   Electronically Signed   By: Garald Balding M.D.   On: 12/13/2014 23:23     EKG: Independently reviewed. Sinus Rhythm rate = 86 +LVH changes  Assessment/Plan:   44 y.o. male with  Principal Problem:   1.    HCAP (healthcare-associated pneumonia)/ Sepsis   Cultures Sent   Placed on IV Vancomycin and Cefepime   Active Problems:   2.    Hypotension   Gentle IVFs     3.    ESRD on dialysis   Notifiy Renal/ Dialysis in AM     to continue Schedule     4.    Hyperparathyroidism, secondary   Hx of Parathyroidectomy      5.  Hypocalcemia-      Replace Calcium    6.   Diarrhea- due to Abxs, or Rule out C.diff   Enteric Precautions   Stool sent for C.Diff PCR      7.    DVT Prophylaxis        Heparin  SQ   Code Status:     FULL CODE       Family Communication:    No Family Present    Disposition Plan:    Inpatient Status        Time spent:  [facility] Pager [phone]   If [place] Please Contact the Day Rounding Team MD for [facility]  If 7PM-7AM, Please Contact Night-Floor Coverage  [email or web address] Password [email or web address] 12/14/2014, 1:55 AM     ADDENDUM:   Patient was seen and examined on 12/14/2014

## 2014-12-14 NOTE — ED Provider Notes (Signed)
CSN: [id number]     Arrival date & time 12/13/14  1641 History   First MD Initiated Contact with Patient 12/13/14 2217     Chief Complaint  Patient presents with  . Fatigue     (Consider location/radiation/quality/duration/timing/severity/associated sxs/prior Treatment) HPI    44 y/o male with a past medical history of ESRD  (dialysis Monday Wednesday Friday), hypertension, depression.   He comes in with complaint of 1 week of illness.  For the past week he has been having dry cough, fatigue diarrhea, generalized malaise, and daily fevers. The diarrhea has been non-bloody.  He's been evaluated twice in the emergency room last week for this same illness and was thought to have a viral syndrome on both presentations.  Previous CXR obtained and showed no evidence of pneumonia.  His sx have continued so he presents again today.  Past Medical History  Diagnosis Date  . ESRD on hemodialysis     HD  . Hypertension   . Anemia   . Thyroid disease   . Cough     DRY    . Headache(784.0)   . Muscle spasms of neck     BACK, NECK  . Depression    Past Surgical History  Procedure Laterality Date  . Hemodialysis graft to left arm and left leg    . Parathyroidectomy N/A 04/24/2014    Procedure: TOTAL 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;  Surgeon: [name], MD;  Location: [facility];  Service: General;  Laterality: N/A;  NECK AND LEFT FOREARM   No family history on file. History  Substance Use Topics  . Smoking status: Former Smoker    Quit date: 03/21/1986  . Smokeless tobacco: Never Used  . Alcohol Use: No    Review of Systems  Constitutional: Positive for fever. Negative for chills.  Eyes: Negative for redness.  Respiratory: Positive for cough. Negative for shortness of breath.   Cardiovascular: Negative for chest pain.  Gastrointestinal: Positive for nausea, vomiting, abdominal pain and diarrhea.  Genitourinary: Negative for dysuria.  Skin: Negative for rash.   Neurological: Negative for headaches.  All other systems reviewed and are negative.     Allergies  Review of patient's allergies indicates no known allergies.  Home Medications   Prior to Admission medications   Medication Sig Start Date End Date Taking? Authorizing Provider  acetaminophen (TYLENOL) 500 MG tablet Take 1,000 mg by mouth every 6 (six) hours as needed for pain. For pain   Yes Historical Provider, MD  amLODipine (NORVASC) 10 MG tablet Take 10 mg by mouth daily.   Yes Historical Provider, MD  calcium carbonate (TUMS - DOSED IN MG ELEMENTAL CALCIUM) 500 MG chewable tablet Chew 2 tablets (400 mg of elemental calcium total) by mouth 2 (two) times daily between meals. 04/26/14  Yes Doreen Salvage, MD  cloNIDine (CATAPRES) 0.2 MG tablet Take 0.2 mg by mouth daily.    Yes Historical Provider, MD  amoxicillin-clavulanate (AUGMENTIN) 875-125 MG per tablet Take 1 tablet by mouth 2 (two) times daily. One po bid x 7 days 07/14/14   Starlyn Skeans, PA-C  azithromycin (ZITHROMAX) 250 MG tablet Take 1 tablet (250 mg total) by mouth daily. Take 1 po qD until finished Patient not taking: Reported on 12/13/2014 12/08/14   Alfonzo Beers, MD  fluticasone [facility]) 50 MCG/ACT nasal spray Place 2 sprays into both nostrils daily. Patient not taking: Reported on 12/05/2014 07/14/14   Courtney Forcucci, PA-C   BP 93/56 mmHg  Pulse 87  Temp(Src) 100.7 F (38.2 C) (Rectal)  Resp 17  SpO2 100% Physical Exam  Constitutional: No distress.  HENT:  Head: Normocephalic and atraumatic.  Eyes: EOM are normal. Pupils are equal, round, and reactive to light.  Neck: Normal range of motion. Neck supple.  Cardiovascular: Normal rate.   Pulmonary/Chest: Effort normal. No respiratory distress. He has no wheezes. He has no rales.  Abdominal: Soft. There is tenderness (generalized, mild). There is no rebound and no guarding.  Neurological: No cranial nerve deficit or sensory deficit. Coordination and gait normal.   Skin: No rash noted. He is not diaphoretic.    ED Course  Procedures (including critical care time) Labs Review Labs Reviewed  COMPREHENSIVE METABOLIC PANEL - Abnormal; Notable for the following:    Chloride 94 (*)    BUN 77 (*)    Creatinine, Ser 24.94 (*)    Calcium 6.5 (*)    Albumin 3.2 (*)    GFR calc non Af Amer 2 (*)    GFR calc Af Amer 2 (*)    Anion gap 25 (*)    All other components within normal limits  CBC WITH DIFFERENTIAL/PLATELET - Abnormal; Notable for the following:    RBC 3.42 (*)    Hemoglobin 10.5 (*)    HCT 32.8 (*)    Platelets 141 (*)    Neutrophils Relative % 80 (*)    All other components within normal limits  CULTURE, BLOOD (ROUTINE X 2)  CULTURE, BLOOD (ROUTINE X 2)  CLOSTRIDIUM DIFFICILE BY PCR  LIPASE, BLOOD  INFLUENZA PANEL BY PCR (TYPE A & B, H1N1)  I-STAT CG4 LACTIC ACID, ED  I-STAT CG4 LACTIC ACID, ED    Imaging Review Dg Chest 2 View  12/13/2014   CLINICAL DATA:  Acute onset of fatigue. Dry lips and generalized weakness. Initial encounter.  EXAM: CHEST  2 VIEW  COMPARISON:  Chest radiograph performed 12/08/2014  FINDINGS: The lungs are well-aerated. Mild bibasilar airspace opacities, left greater than right, raise concern for pneumonia. This is particularly evident on the lateral view. There is no evidence of pleural effusion or pneumothorax.  The heart is normal in size; the mediastinal contour is within normal limits. No acute osseous abnormalities are seen.  IMPRESSION: Bibasilar pneumonia noted, more prominent on the left.   Electronically Signed   By: Garald Balding M.D.   On: 12/13/2014 23:23     EKG Interpretation   Date/Time:  Saturday December 13 2014 22:10:45 EST Ventricular Rate:  86 PR Interval:  164 QRS Duration: 75 QT Interval:  423 QTC Calculation: 506 R Axis:   40 Text Interpretation:  Sinus rhythm Probable left atrial enlargement Left  ventricular hypertrophy Prolonged QT interval No significant change since  last  tracing Confirmed by Christy Gentles  MD, DONALD ([id number]) on 12/13/2014  10:13:20 PM      MDM   Final diagnoses:  None    44 y/o male with a past medical history of ESRD  (dialysis Monday Wednesday Friday), hypertension, depression.   He comes in with complaint of 1 week of illness.  For the past week he has been having dry cough, fatigue diarrhea, generalized malaise, and daily fevers. The diarrhea has been non-bloody.  Exam as above the patient's temp is 100.7 vital signs all otherwise stable, will do infectious w/u w/ chest x-ray CBC CMP blood cultures.  patient is not making urine  CBC/cmp unremarkable.  No acute need for dialysis.  On chest shows bibasilar infiltrates patient is ESRD  concern for healthcare associated pneumonia. Have spoken w/ the hospitalist.  Patient be admitted to hospitalist service   Jarome Matin, MD 12/15/14 0110  Ripley Fraise, MD 12/15/14 2227

## 2014-12-14 NOTE — Progress Notes (Addendum)
ANTIBIOTIC CONSULT NOTE - INITIAL  Pharmacy Consult for Vancomycin Indication: pneumonia  No Known Allergies  Patient Measurements: Estimated weight 70 kg (71 kg on 07/14/14)  Need to obtain current weight this admit  Vital Signs: Temp: 100.7 F (38.2 C) (03/12 2305) Temp Source: Oral (03/12 2356) BP: 121/76 mmHg (03/12 2356) Pulse Rate: 84 (03/12 2356) Intake/Output from previous day:   Intake/Output from this shift:    Labs:  Recent Labs  12/13/14 1718  WBC 8.3  HGB 10.5*  PLT 141*  CREATININE 24.94*   CrCl cannot be calculated (Unknown ideal weight.). No results for input(s): VANCOTROUGH, VANCOPEAK, VANCORANDOM, GENTTROUGH, GENTPEAK, GENTRANDOM, TOBRATROUGH, TOBRAPEAK, TOBRARND, AMIKACINPEAK, AMIKACINTROU, AMIKACIN in the last 72 hours.   Microbiology: Recent Results (from the past 720 hour(s))  Blood culture (routine x 2)     Status: None (Preliminary result)   Collection Time: 12/08/14  5:43 PM  Result Value Ref Range Status   Specimen Description BLOOD ARM RIGHT  Final   Special Requests BOTTLES DRAWN AEROBIC AND ANAEROBIC 5CC  Final   Culture   Final           BLOOD CULTURE RECEIVED NO GROWTH TO DATE CULTURE WILL BE HELD FOR 5 DAYS BEFORE ISSUING A FINAL NEGATIVE REPORT Performed at [facility]    Report Status PENDING  Incomplete  Blood culture (routine x 2)     Status: None (Preliminary result)   Collection Time: 12/08/14  5:50 PM  Result Value Ref Range Status   Specimen Description BLOOD HAND RIGHT  Final   Special Requests BOTTLES DRAWN AEROBIC AND ANAEROBIC 5CC  Final   Culture   Final           BLOOD CULTURE RECEIVED NO GROWTH TO DATE CULTURE WILL BE HELD FOR 5 DAYS BEFORE ISSUING A FINAL NEGATIVE REPORT Performed at [facility]    Report Status PENDING  Incomplete    Medical History: Past Medical History  Diagnosis Date  . ESRD on hemodialysis     HD  . Hypertension   . Anemia   . Thyroid disease   . Cough     DRY     . Headache(784.0)   . Muscle spasms of neck     BACK, NECK  . Depression     Medications:  Scheduled:   Infusions:  . ceFEPime (MAXIPIME) IV    . vancomycin     Assessment: 44 y.o male with ESRD, HD qMWF, last dialysis treatment on Wed 12/10/14.  Tc 100.7, WBC 8.3K Pharmacy consulted to dose vancomycin for pneumonia.  Cefepime 1gm IV x1 also ordered in ED.  Goal of Therapy:  Pre dialysis vancomycin level 15-25 mcg/ml  Plan:  Vancomycin 1500 mg IV x1 now  F/u on current weight and when next HD planned then dose vancomycin post each HD.  Monitor vancomycin pre HD levels prn per protocol.  Nicole Cella, RPh Clinical Pharmacist Pager: [phone] 12/14/2014,12:18 AM  Addendum: Weight has been updated and cefepime also to continue. No HD orders for today yet.  Plan: - Vancomycin 750mg  post-HD MWF - Cefepime 2mg  post-HD MWF - F/u HD plans for today and add additional post-HD doses if necessary - F/u renal plans, C&S, clinical status and pre-HD level when appropriate  Salome Arnt, PharmD, BCPS Pager # [phone] 12/14/2014 7:54 AM   Addendum: Going to HD after midnight tonight, then also has orders for tomorrow night, but suspect this session may get moved?  Will order vancomycin 750 x  1 after HD tonight. Cefepime 2g x 1 after HD tonight.  F/u next session.  Uvaldo Rising, BCPS  Clinical Pharmacist Pager [phone]  12/14/2014 10:07 PM

## 2014-12-14 NOTE — Consult Note (Signed)
[facility] Renal Consultation Note  Indication for Consultation:  Management of ESRD/hemodialysis; anemia, hypertension/volume and secondary hyperparathyroidism  HPI: [name] is a 44 y.o. male with a history of hypertension, Hepatitis B, parathyroidectomy on 04/24/14, and ESRD on dialysis at the [facility] who presented to the ED last night with nearly two weeks of worsening dyspnea, fatigue, cough, decreased appetite, and intermittent fever and chills and one week of diarrhea, one or two episodes a day.  He was seen at his dialysis center on 3/4, at which time blood cultures were obtained and he was given a prescription for Arithromycin (Z-Pak), which he did not complete.  He came to the ED with the same symptoms on the night of 3/4 and again on 3/7, at which time blood cultures were also drawn and turned out to be negative.  Chest x-ray today showed bibasilar pneumonia, so he was started on IV Vancomycin and Cefepime.  During his illness he often did not feel well enough to go to his outpatient dialysis and attended only two of his last five treatments, the last on 3/9.  As a result, his BUN and creatinine are 85 and 24.99 respectively with a potassium of 5.7, but he is significantly under his dry weight.  Chart review: 3/06 - a/c renal failure, creat 4. HTN urgency, n/v, abd pain.  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 ESRD on hemodialysis     HD  .  Hypertension   . Anemia   . Thyroid disease   . Cough     DRY    . Headache(784.0)   . Muscle spasms of neck     BACK, NECK  . Depression    Past Surgical History  Procedure Laterality Date  . Hemodialysis graft to left arm and left leg    . Parathyroidectomy N/A 04/24/2014    Procedure: TOTAL 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;  Surgeon: [name], MD;  Location: [place];  Service: General;  Laterality: N/A;  NECK AND LEFT FOREARM   No family history on file.  Social History He quit smoking cigarettes about 28 years ago and denies any history of alcohol or illicit drug use.  He was born in [place] and moved to the [place] 15 years ago.  No Known Allergies Prior to Admission medications   Medication Sig Start Date End Date Taking? Authorizing Provider  acetaminophen (TYLENOL) 500 MG tablet Take 1,000 mg by mouth every 6 (six) hours as needed for pain. For pain   Yes Historical Provider, MD  amLODipine (NORVASC) 10 MG tablet Take 10 mg by mouth daily.   Yes Historical Provider, MD  calcium carbonate (TUMS - DOSED IN MG ELEMENTAL CALCIUM) 500 MG  chewable tablet Chew 2 tablets (400 mg of elemental calcium total) by mouth 2 (two) times daily between meals. 04/26/14  Yes Doreen Salvage, MD  cloNIDine (CATAPRES) 0.2 MG tablet Take 0.2 mg by mouth daily.    Yes Historical Provider, MD  amoxicillin-clavulanate (AUGMENTIN) 875-125 MG per tablet Take 1 tablet by mouth 2 (two) times daily. One po bid x 7 days 07/14/14   Starlyn Skeans, PA-C  azithromycin (ZITHROMAX) 250 MG tablet Take 1 tablet (250 mg total) by mouth daily. Take 1 po qD until finished Patient not taking: Reported on 12/13/2014 12/08/14   Alfonzo Beers, MD  fluticasone [facility]) 50 MCG/ACT nasal spray Place 2 sprays into both nostrils daily. Patient not taking: Reported on 12/05/2014 07/14/14   Starlyn Skeans, PA-C   Labs:  Results for orders placed or performed during the hospital encounter of 12/13/14 (from the  past 48 hour(s))  Comprehensive metabolic panel     Status: Abnormal   Collection Time: 12/13/14  5:18 PM  Result Value Ref Range   Sodium 140 135 - 145 mmol/L   Potassium 4.9 3.5 - 5.1 mmol/L   Chloride 94 (L) 96 - 112 mmol/L   CO2 21 19 - 32 mmol/L   Glucose, Bld 90 70 - 99 mg/dL   BUN 77 (H) 6 - 23 mg/dL   Creatinine, Ser 24.94 (H) 0.50 - 1.35 mg/dL   Calcium 6.5 (L) 8.4 - 10.5 mg/dL   Total Protein 8.0 6.0 - 8.3 g/dL   Albumin 3.2 (L) 3.5 - 5.2 g/dL   AST 35 0 - 37 U/L   ALT 22 0 - 53 U/L   Alkaline Phosphatase 52 39 - 117 U/L   Total Bilirubin 1.0 0.3 - 1.2 mg/dL   GFR calc non Af Amer 2 (L) >90 mL/min   GFR calc Af Amer 2 (L) >90 mL/min    Comment: (NOTE) The eGFR has been calculated using the CKD EPI equation. This calculation has not been validated in all clinical situations. eGFR's persistently <90 mL/min signify possible Chronic Kidney Disease.    Anion gap 25 (H) 5 - 15  Lipase, blood     Status: None   Collection Time: 12/13/14  5:18 PM  Result Value Ref Range   Lipase 43 11 - 59 U/L  CBC with Differential     Status: Abnormal   Collection Time: 12/13/14  5:18 PM  Result Value Ref Range   WBC 8.3 4.0 - 10.5 K/uL   RBC 3.42 (L) 4.22 - 5.81 MIL/uL   Hemoglobin 10.5 (L) 13.0 - 17.0 g/dL   HCT 32.8 (L) 39.0 - 52.0 %   MCV 95.9 78.0 - 100.0 fL   MCH 30.7 26.0 - 34.0 pg   MCHC 32.0 30.0 - 36.0 g/dL   RDW 14.1 11.5 - 15.5 %   Platelets 141 (L) 150 - 400 K/uL   Neutrophils Relative % 80 (H) 43 - 77 %   Lymphocytes Relative 12 12 - 46 %   Monocytes Relative 7 3 - 12 %   Eosinophils Relative 0 0 - 5 %   Basophils Relative 1 0 - 1 %   Neutro Abs 6.6 1.7 - 7.7 K/uL   Lymphs Abs 1.0 0.7 - 4.0 K/uL   Monocytes Absolute 0.6 0.1 - 1.0 K/uL   Eosinophils Absolute 0.0 0.0 - 0.7 K/uL   Basophils Absolute 0.1 0.0 - 0.1 K/uL   Smear Review LARGE PLATELETS PRESENT   Influenza panel by pcr  Status: None   Collection Time: 12/13/14 11:46 PM  Result Value Ref Range    Influenza A By PCR NEGATIVE NEGATIVE   Influenza B By PCR NEGATIVE NEGATIVE   H1N1 flu by pcr NOT DETECTED NOT DETECTED    Comment:        The Xpert Flu assay (FDA approved for nasal aspirates or washes and nasopharyngeal swab specimens), is intended as an aid in the diagnosis of influenza and should not be used as a sole basis for treatment.   I-Stat CG4 Lactic Acid, ED     Status: None   Collection Time: 12/14/14 12:11 AM  Result Value Ref Range   Lactic Acid, Venous 1.39 0.5 - 2.0 mmol/L  Clostridium Difficile by PCR     Status: None   Collection Time: 12/14/14  4:34 AM  Result Value Ref Range   C difficile by pcr NEGATIVE NEGATIVE  Basic metabolic panel     Status: Abnormal   Collection Time: 12/14/14  5:40 AM  Result Value Ref Range   Sodium 140 135 - 145 mmol/L   Potassium 5.7 (H) 3.5 - 5.1 mmol/L   Chloride 95 (L) 96 - 112 mmol/L   CO2 23 19 - 32 mmol/L   Glucose, Bld 93 70 - 99 mg/dL   BUN 85 (H) 6 - 23 mg/dL   Creatinine, Ser 24.99 (H) 0.50 - 1.35 mg/dL   Calcium 6.2 (LL) 8.4 - 10.5 mg/dL    Comment: REPEATED TO VERIFY CRITICAL RESULT CALLED TO, READ BACK BY AND VERIFIED WITH: MOOREJRN [id number] MCCAULEG    GFR calc non Af Amer 2 (L) >90 mL/min    Comment: REPEATED TO VERIFY   GFR calc Af Amer 2 (L) >90 mL/min    Comment: (NOTE) The eGFR has been calculated using the CKD EPI equation. This calculation has not been validated in all clinical situations. eGFR's persistently <90 mL/min signify possible Chronic Kidney Disease.    Anion gap 22 (H) 5 - 15  CBC     Status: Abnormal   Collection Time: 12/14/14  5:40 AM  Result Value Ref Range   WBC 9.2 4.0 - 10.5 K/uL   RBC 3.43 (L) 4.22 - 5.81 MIL/uL   Hemoglobin 10.3 (L) 13.0 - 17.0 g/dL   HCT 33.2 (L) 39.0 - 52.0 %   MCV 96.8 78.0 - 100.0 fL   MCH 30.0 26.0 - 34.0 pg   MCHC 31.0 30.0 - 36.0 g/dL   RDW 14.4 11.5 - 15.5 %   Platelets 161 150 - 400 K/uL   Constitutional: positive for anorexia, chills,  fatigue and fevers, negative for sweats Ears, nose, mouth, throat, and face: negative for earaches, hoarseness, nasal congestion and sore throat Respiratory: positive for cough and dyspnea on exertion, negative for hemoptysis and sputum Cardiovascular: positive for dyspnea, negative for chest pain, chest pressure/discomfort, orthopnea and palpitations Gastrointestinal: positive for diarrhea, negative for abdominal pain, nausea and vomiting Genitourinary:negative, anuric Musculoskeletal:negative for arthralgias, back pain, myalgias and neck pain Neurological: negative for dizziness, gait problems, headaches, paresthesia and speech problems  Physical Exam: Filed Vitals:   12/14/14 0957  BP: 122/88  Pulse: 77  Temp: 98.3 F (36.8 C)  Resp: 18     General appearance: alert, cooperative and no distress Head: Normocephalic, without obvious abnormality, atraumatic Neck: no adenopathy, no carotid bruit, no JVD and supple, symmetrical, trachea midline Resp: mild rhonchi bilaterally Cardio: regular rate and rhythm, S1, S2 normal, no murmur, click, rub or gallop GI:  soft, non-tender; bowel sounds normal; no masses,  no organomegaly Extremities: extremities normal, atraumatic, no cyanosis or edema Neurologic: Grossly normal Dialysis Access: L thigh AVG with + bruit   Assessment/Plan: 1. Pneumonia - bilateral per CXR 3/13, BCs 3/7 negative, on Vancomycin & Cefepime. 2. Diarrhea - C diff negative. 3. ESRD - HD on MWF @ [facility], recent missed Txs sec to illness, BUN/Cr 85/24.99, K 5.7.  HD pending today. 4. Hypertension/volume - BP 122/88, no meds, but takes Amlodipine 10 mg qhs at home; wt 66.9 kg, below EDW. 5. Anemia - Hgb 10.3, no meds.  Follow CBC. 6. Sec HPT - s/p parathyroidectomy 04/24/14; Ca 6.2 (6.8 corrected), last P 3.8, iPTH 22; Hectorol 1 mcg, Tums between meals. 7. Nutrition - Alb 3.2, renal diet, vitamin. 8. Hepatitis B - isolation with HD.  LYLES,CHARLES 12/14/2014, 12:54 PM    Attending Nephrologist:   Roney Jaffe, MD  Pt seen, examined, agree w assess/plan as above with additions as indicated. ESRD patient w hep B, HTN presenting with chills, fever, SOB, fatigue and nausea.  Symptoms ongoing for last 1 1/2 - 2 weeks.  He has missed 8 of the last 12 HD sessions related to this.  Was admitted yesterday with CXR showing patchy basilar disease and is being treated for HCAP. Creat is 22.  Plan on HD tonight and then prob again tomorrow to get solute down.  He is below his dry wt consistent w the acute illness and GI manifestations. Will follow  Kelly Splinter MD pager [phone]    cell [phone] 12/14/2014, 7:19 PM

## 2014-12-14 NOTE — Progress Notes (Signed)
Called ED and Security about pt's report of leaving his cell phone with his charger in the ED lobby charging.  Security will come up and talk with pt.

## 2014-12-15 LAB — CULTURE, BLOOD (ROUTINE X 2)
Culture: NO GROWTH
Culture: NO GROWTH

## 2014-12-15 LAB — CBC
HCT: 32.9 % — ABNORMAL LOW (ref 39.0–52.0)
Hemoglobin: 10.5 g/dL — ABNORMAL LOW (ref 13.0–17.0)
MCH: 30.5 pg (ref 26.0–34.0)
MCHC: 31.9 g/dL (ref 30.0–36.0)
MCV: 95.6 fL (ref 78.0–100.0)
PLATELETS: 232 10*3/uL (ref 150–400)
RBC: 3.44 MIL/uL — AB (ref 4.22–5.81)
RDW: 14.2 % (ref 11.5–15.5)
WBC: 8.6 10*3/uL (ref 4.0–10.5)

## 2014-12-15 LAB — RENAL FUNCTION PANEL
ALBUMIN: 2.7 g/dL — AB (ref 3.5–5.2)
ANION GAP: 17 — AB (ref 5–15)
BUN: 26 mg/dL — ABNORMAL HIGH (ref 6–23)
CO2: 26 mmol/L (ref 19–32)
Calcium: 7.9 mg/dL — ABNORMAL LOW (ref 8.4–10.5)
Chloride: 98 mmol/L (ref 96–112)
Creatinine, Ser: 11.27 mg/dL — ABNORMAL HIGH (ref 0.50–1.35)
GFR calc Af Amer: 6 mL/min — ABNORMAL LOW (ref >90)
GFR, EST NON AFRICAN AMERICAN: 5 mL/min — AB (ref >90)
Glucose, Bld: 87 mg/dL (ref 70–99)
POTASSIUM: 3.9 mmol/L (ref 3.5–5.1)
Phosphorus: 2.8 mg/dL (ref 2.3–4.6)
Sodium: 141 mmol/L (ref 135–145)

## 2014-12-15 LAB — HEPATITIS B SURF AG CONFIRMATION: HEPATITIS B SURFACE ANTIGEN CONFIRMATION: POSITIVE — AB

## 2014-12-15 LAB — HEPATITIS B SURFACE ANTIGEN: HEP B S AG: POSITIVE — AB

## 2014-12-15 MED ORDER — VANCOMYCIN HCL IN DEXTROSE 750-5 MG/150ML-% IV SOLN
750.0000 mg | INTRAVENOUS | Status: DC
  Filled 2014-12-15: qty 150

## 2014-12-15 MED ORDER — SODIUM CHLORIDE 0.9 % IV SOLN: 100.0000 mL | INTRAVENOUS | Status: DC | PRN

## 2014-12-15 MED ORDER — VANCOMYCIN HCL IN DEXTROSE 750-5 MG/150ML-% IV SOLN
750.0000 mg | INTRAVENOUS | Status: DC
  Administered 2014-12-17: 750 mg via INTRAVENOUS
  Filled 2014-12-15 (×2): qty 150

## 2014-12-15 MED ORDER — LIDOCAINE-PRILOCAINE 2.5-2.5 % EX CREA: 1.0000 "application " | TOPICAL_CREAM | CUTANEOUS | Status: DC | PRN

## 2014-12-15 MED ORDER — DEXTROSE 5 % IV SOLN
2.0000 g | INTRAVENOUS | Status: DC
  Filled 2014-12-15: qty 2

## 2014-12-15 MED ORDER — NEPRO/CARBSTEADY PO LIQD
237.0000 mL | ORAL | Status: DC | PRN
  Filled 2014-12-15: qty 237

## 2014-12-15 MED ORDER — HEPARIN SODIUM (PORCINE) 1000 UNIT/ML DIALYSIS
1500.0000 [IU] | Freq: Once | INTRAMUSCULAR | Status: DC
  Filled 2014-12-15: qty 2

## 2014-12-15 MED ORDER — NEPRO/CARBSTEADY PO LIQD: 237.0000 mL | ORAL | Status: DC | PRN

## 2014-12-15 MED ORDER — DEXTROSE 5 % IV SOLN
2.0000 g | INTRAVENOUS | Status: DC
  Administered 2014-12-17: 2 g via INTRAVENOUS
  Filled 2014-12-15: qty 2

## 2014-12-15 MED ORDER — ALTEPLASE 2 MG IJ SOLR
2.0000 mg | Freq: Once | INTRAMUSCULAR | Status: DC | PRN
  Filled 2014-12-15: qty 2

## 2014-12-15 MED ORDER — HEPARIN SODIUM (PORCINE) 1000 UNIT/ML DIALYSIS: 1500.0000 [IU] | Freq: Once | INTRAMUSCULAR | Status: DC

## 2014-12-15 MED ORDER — ALTEPLASE 2 MG IJ SOLR: 2.0000 mg | Freq: Once | INTRAMUSCULAR | Status: DC | PRN

## 2014-12-15 MED ORDER — HEPARIN SODIUM (PORCINE) 1000 UNIT/ML DIALYSIS
1000.0000 [IU] | INTRAMUSCULAR | Status: DC | PRN
  Filled 2014-12-15: qty 1

## 2014-12-15 MED ORDER — PENTAFLUOROPROP-TETRAFLUOROETH EX AERO: 1.0000 "application " | INHALATION_SPRAY | CUTANEOUS | Status: DC | PRN

## 2014-12-15 MED ORDER — LIDOCAINE HCL (PF) 1 % IJ SOLN: 5.0000 mL | INTRAMUSCULAR | Status: DC | PRN

## 2014-12-15 MED ORDER — HEPARIN SODIUM (PORCINE) 1000 UNIT/ML DIALYSIS: 1000.0000 [IU] | INTRAMUSCULAR | Status: DC | PRN

## 2014-12-15 NOTE — Progress Notes (Signed)
Subjective:  Feels weak, no sob , denies diarrhea now, HD late last night to 5 am/ for hd later today , he is slightly hesitant since just getting off 5am / I told him after lunch ot later  Objective Vital signs in last 24 hours: Filed Vitals:   12/15/14 0430 12/15/14 0445 12/15/14 0500 12/15/14 0555  BP: 112/68 108/68 129/81 123/82  Pulse: 92 91 78 88  Temp:  100 F (37.8 C)  100.2 F (37.9 C)  TempSrc:  Oral  Oral  Resp: 18 20 19 18   Weight:  67.8 kg (149 lb 7.6 oz)  67.6 kg (149 lb 0.5 oz)  SpO2:  96%  98%   Weight change: 1.094 kg (2 lb 6.6 oz)  Physical Exam: General : alert,nad LUNGS: Faint R  rhonchi  Cardio: RRR, no rub, mur, or gallop GI: soft, non-tender; BS pos nl, NT, nD Extremities: extremities normal, atraumatic, no cyanosis or edema Dialysis Access: L FEm  AVG  + bruit    Problem/Plan: 1. Pneumonia - bilateral per CXR 3/13, BCs 3/7 negative, on Vancomycin & Cefepime. 2. Diarrhea - C diff negative. 3. ? Viral syndrome- but  neg H1 N1, infu a and b / temp 100.1 this am on IV antib  4. ESRD - HD on MWF @ [facility], recent missed Txs X 2 last week ( mon./ Fri)sec to illness,  K 3.7. HD later today to keep on Schedule . 5. Hypertension/volume - BP 123/82, no  Current bp meds, takes Amlodipine 10 mg qhs at home; wt 67.6 kg, below EDW. 6. Anemia - Hgb 10.5, no meds. Follow CBC. 7. Sec HPT - s/p parathyroidectomy 04/24/14; Ca 7.9,( corrected 8.9)  P 2.8, iPTH 22; Hectorol 1 mcg, Tums between meals.( no binders with meals ) 8. Nutrition - Alb 2.7, renal diet, vitamin. 9. Hepatitis B - isolation with HD.  Ernest Haber, PA-C [facility] [phone] 12/15/2014,9:10 AM  LOS: 1 day   I have seen and examined this patient and agree with the plan of care. He is feeling somewhat better and denies fevers but per charting still had temp in the 100's.  Denies productive cough, chest pain, diaphoresis. Left femoral graft pulsatile. Currently being treated as a  HCAP. Cxs pending. On for HD later in the afternoon after HD last night. Then will resume MWF regimen; pt w/ h/o [place] with many missed treatments.  Dwana Melena, MD 12/15/2014, 10:29 AM  Labs: Basic Metabolic Panel:  Recent Labs Lab 12/13/14 1718 12/14/14 0540 12/15/14 0608  NA 140 140 141  K 4.9 5.7* 3.9  CL 94* 95* 98  CO2 21 23 26   GLUCOSE 90 93 87  BUN 77* 85* 26*  CREATININE 24.94* 24.99* 11.27*  CALCIUM 6.5* 6.2* 7.9*  PHOS  --   --  2.8   Liver Function Tests:  Recent Labs Lab 12/08/14 1351 12/13/14 1718 12/15/14 0608  AST 44* 35  --   ALT 31 22  --   ALKPHOS 56 52  --   BILITOT 0.7 1.0  --   PROT 7.1 8.0  --   ALBUMIN 3.3* 3.2* 2.7*    Recent Labs Lab 12/08/14 1351 12/13/14 1718  LIPASE 133* 43   No results for input(s): AMMONIA in the last 168 hours. CBC:  Recent Labs Lab 12/08/14 1351 12/13/14 1718 12/14/14 0540 12/15/14 0608  WBC 2.4* 8.3 9.2 8.6  NEUTROABS 1.4* 6.6  --   --   HGB 10.6* 10.5* 10.3*  10.5*  HCT 32.9* 32.8* 33.2* 32.9*  MCV 95.1 95.9 96.8 95.6  PLT 148* 141* 161 232   Cardiac Enzymes: No results for input(s): CKTOTAL, CKMB, CKMBINDEX, TROPONINI in the last 168 hours. CBG: No results for input(s): GLUCAP in the last 168 hours.  Studies/Results: Dg Chest 2 View  12/13/2014   CLINICAL DATA:  Acute onset of fatigue. Dry lips and generalized weakness. Initial encounter.  EXAM: CHEST  2 VIEW  COMPARISON:  Chest radiograph performed 12/08/2014  FINDINGS: The lungs are well-aerated. Mild bibasilar airspace opacities, left greater than right, raise concern for pneumonia. This is particularly evident on the lateral view. There is no evidence of pleural effusion or pneumothorax.  The heart is normal in size; the mediastinal contour is within normal limits. No acute osseous abnormalities are seen.  IMPRESSION: Bibasilar pneumonia noted, more prominent on the left.   Electronically Signed   By: Garald Balding M.D.   On: 12/13/2014 23:23    Medications:   . calcium carbonate  2 tablet Oral BID BM  . heparin  5,000 Units Subcutaneous 3 times per day  . sodium chloride  3 mL Intravenous Q12H

## 2014-12-15 NOTE — Progress Notes (Signed)
PATIENT DETAILS Name: [name] Age: 44 y.o. Sex: male Date of Birth: 03-15-1971 Admit Date: 12/13/2014 Admitting Physician Theressa Millard, MD PCP:No PCP Per Patient  Subjective: Feels much better. No diarrhea. Cough/SOB better  Assessment/Plan: Principal Problem:   HCAP (healthcare-associated pneumonia): Admitted and started on vancomycin and cefepime. Continues to have low-grade fever overnight,but clinically improved compared to on admission. Blood culture neg so far.    Active Problems:   Sepsis: Secondary to above. Sepsis pathophysiology has resolved.  Continue Abx    End-stage renal disease: On hemodialysis M,W,F.  Renal following    Mild hyperkalemia: resolved with HD. Monitor lytes periodically    Diarrhea: C. difficile PCR negative.Diarrhea has now resolved with supportive care.     Hypocalcemia: Defer to nephrology.    Hypertension: Stable, continue to hold oral antihypertensive agents.  Disposition: Remain inpatient-home in 1-2 days  Antibiotics:  See below   Anti-infectives    Start     Dose/Rate Route Frequency Ordered Stop   12/15/14 1800  ceFEPIme (MAXIPIME) 2 g in dextrose 5 % 50 mL IVPB  Status:  Discontinued     2 g 100 mL/hr over 30 Minutes Intravenous Every M-W-F (1800) 12/14/14 0752 12/14/14 2207   12/15/14 1800  ceFEPIme (MAXIPIME) 2 g in dextrose 5 % 50 mL IVPB     2 g 100 mL/hr over 30 Minutes Intravenous Every M-W-F (1800) 12/15/14 0939     12/15/14 1600  vancomycin (VANCOCIN) IVPB 750 mg/150 ml premix     750 mg 150 mL/hr over 60 Minutes Intravenous Every M-W-F (Hemodialysis) 12/15/14 0939     12/15/14 1200  vancomycin (VANCOCIN) IVPB 750 mg/150 ml premix  Status:  Discontinued     750 mg 150 mL/hr over 60 Minutes Intravenous Every M-W-F (Hemodialysis) 12/14/14 0752 12/14/14 2205   12/15/14 0500  vancomycin (VANCOCIN) IVPB 750 mg/150 ml premix     750 mg 150 mL/hr over 60 Minutes Intravenous  Once 12/14/14 2205  12/15/14 0713   12/15/14 0500  ceFEPIme (MAXIPIME) 2 g in dextrose 5 % 50 mL IVPB     2 g 100 mL/hr over 30 Minutes Intravenous  Once 12/14/14 2207 12/15/14 0743   12/14/14 0100  vancomycin (VANCOCIN) 1,500 mg in sodium chloride 0.9 % 500 mL IVPB     1,500 mg 250 mL/hr over 120 Minutes Intravenous  Once 12/14/14 0017 12/14/14 0321   12/14/14 0015  ceFEPIme (MAXIPIME) 1 g in dextrose 5 % 50 mL IVPB     1 g 100 mL/hr over 30 Minutes Intravenous  Once 12/14/14 0009 12/14/14 0118      DVT Prophylaxis: Prophylactic  Heparin   Code Status: Full code  Family Communication None at bedside  Procedures:  None  CONSULTS:  nephrology   MEDICATIONS: Scheduled Meds: . calcium carbonate  2 tablet Oral BID BM  . ceFEPime (MAXIPIME) IV  2 g Intravenous Q M,W,F-1800  . heparin  5,000 Units Subcutaneous 3 times per day  . sodium chloride  3 mL Intravenous Q12H  . vancomycin  750 mg Intravenous Q M,W,F-HD   Continuous Infusions:  PRN Meds:.sodium chloride, acetaminophen **OR** acetaminophen, HYDROmorphone (DILAUDID) injection, ondansetron **OR** ondansetron (ZOFRAN) IV, oxyCODONE, sodium chloride    PHYSICAL EXAM: Vital signs in last 24 hours: Filed Vitals:   12/15/14 0445 12/15/14 0500 12/15/14 0555 12/15/14 0956  BP: 108/68 129/81 123/82 113/70  Pulse: 91 78 88 84  Temp: 100 F (37.8 C)  100.2 F (37.9 C) 99 F (37.2 C)  TempSrc: Oral  Oral Oral  Resp: 20 19 18 18   Weight: 67.8 kg (149 lb 7.6 oz)  67.6 kg (149 lb 0.5 oz)   SpO2: 96%  98% 92%    Weight change: 1.094 kg (2 lb 6.6 oz) Filed Weights   12/15/14 0040 12/15/14 0445 12/15/14 0555  Weight: 68 kg (149 lb 14.6 oz) 67.8 kg (149 lb 7.6 oz) 67.6 kg (149 lb 0.5 oz)   Body mass index is 20.79 kg/(m^2).   Gen Exam: Awake and alert with clear speech.   Neck: Supple, No JVD.   Chest: B/L Clear.  No rales heared CVS: S1 S2 Regular, no murmurs.  Abdomen: soft, BS +, non tender, non distended.  Extremities: no edema,  lower extremities warm to touch. Neurologic: Non Focal.   Skin: No Rash.   Wounds: N/A.    Intake/Output from previous day:  Intake/Output Summary (Last 24 hours) at 12/15/14 1030 Last data filed at 12/15/14 [id number]  Gross per 24 hour  Intake    120 ml  Output     12 ml  Net    108 ml     LAB RESULTS: CBC  Recent Labs Lab 12/08/14 1351 12/13/14 1718 12/14/14 0540 12/15/14 0608  WBC 2.4* 8.3 9.2 8.6  HGB 10.6* 10.5* 10.3* 10.5*  HCT 32.9* 32.8* 33.2* 32.9*  PLT 148* 141* 161 232  MCV 95.1 95.9 96.8 95.6  MCH 30.6 30.7 30.0 30.5  MCHC 32.2 32.0 31.0 31.9  RDW 13.7 14.1 14.4 14.2  LYMPHSABS 0.7 1.0  --   --   MONOABS 0.2 0.6  --   --   EOSABS 0.0 0.0  --   --   BASOSABS 0.0 0.1  --   --     Chemistries   Recent Labs Lab 12/08/14 1351 12/13/14 1718 12/14/14 0540 12/15/14 0608  NA 137 140 140 141  K 5.4* 4.9 5.7* 3.9  CL 93* 94* 95* 98  CO2 26 21 23 26   GLUCOSE 90 90 93 87  BUN 81* 77* 85* 26*  CREATININE 22.85* 24.94* 24.99* 11.27*  CALCIUM 6.6* 6.5* 6.2* 7.9*    CBG: No results for input(s): GLUCAP in the last 168 hours.  GFR CrCl cannot be calculated (Unknown ideal weight.).  Coagulation profile No results for input(s): INR, PROTIME in the last 168 hours.  Cardiac Enzymes No results for input(s): CKMB, TROPONINI, MYOGLOBIN in the last 168 hours.  Invalid input(s): CK  Invalid input(s): POCBNP No results for input(s): DDIMER in the last 72 hours. No results for input(s): HGBA1C in the last 72 hours. No results for input(s): CHOL, HDL, LDLCALC, TRIG, CHOLHDL, LDLDIRECT in the last 72 hours. No results for input(s): TSH, T4TOTAL, T3FREE, THYROIDAB in the last 72 hours.  Invalid input(s): FREET3 No results for input(s): VITAMINB12, FOLATE, FERRITIN, TIBC, IRON, RETICCTPCT in the last 72 hours.  Recent Labs  12/13/14 1718  LIPASE 43    Urine Studies No results for input(s): UHGB, CRYS in the last 72 hours.  Invalid input(s): UACOL, UAPR,  USPG, UPH, UTP, UGL, UKET, UBIL, UNIT, UROB, ULEU, UEPI, UWBC, URBC, UBAC, CAST, UCOM, BILUA  MICROBIOLOGY: Recent Results (from the past 240 hour(s))  Blood culture (routine x 2)     Status: None   Collection Time: 12/08/14  5:43 PM  Result Value Ref Range Status   Specimen Description BLOOD ARM RIGHT  Final   Special Requests BOTTLES DRAWN AEROBIC AND  ANAEROBIC 5CC  Final   Culture   Final    NO GROWTH 5 DAYS Performed at [facility]    Report Status 12/15/2014 FINAL  Final  Blood culture (routine x 2)     Status: None   Collection Time: 12/08/14  5:50 PM  Result Value Ref Range Status   Specimen Description BLOOD HAND RIGHT  Final   Special Requests BOTTLES DRAWN AEROBIC AND ANAEROBIC 5CC  Final   Culture   Final    NO GROWTH 5 DAYS Performed at [facility]    Report Status 12/15/2014 FINAL  Final  Blood culture (routine x 2)     Status: None (Preliminary result)   Collection Time: 12/13/14 12:05 AM  Result Value Ref Range Status   Specimen Description BLOOD RIGHT HAND  Final   Special Requests BOTTLES DRAWN AEROBIC AND ANAEROBIC 5CC  Final   Culture   Final           BLOOD CULTURE RECEIVED NO GROWTH TO DATE CULTURE WILL BE HELD FOR 5 DAYS BEFORE ISSUING A FINAL NEGATIVE REPORT Performed at [facility]    Report Status PENDING  Incomplete  Clostridium Difficile by PCR     Status: None   Collection Time: 12/14/14  4:34 AM  Result Value Ref Range Status   C difficile by pcr NEGATIVE NEGATIVE Final    RADIOLOGY STUDIES/RESULTS: Dg Chest 2 View  12/13/2014   CLINICAL DATA:  Acute onset of fatigue. Dry lips and generalized weakness. Initial encounter.  EXAM: CHEST  2 VIEW  COMPARISON:  Chest radiograph performed 12/08/2014  FINDINGS: The lungs are well-aerated. Mild bibasilar airspace opacities, left greater than right, raise concern for pneumonia. This is particularly evident on the lateral view. There is no evidence of pleural effusion or  pneumothorax.  The heart is normal in size; the mediastinal contour is within normal limits. No acute osseous abnormalities are seen.  IMPRESSION: Bibasilar pneumonia noted, more prominent on the left.   Electronically Signed   By: Garald Balding M.D.   On: 12/13/2014 23:23   Dg Chest 2 View  12/08/2014   CLINICAL DATA:  Fever for 1 week. Diarrhea for 1 day. History of hypertension, end-stage renal disease on dialysis. Thyroid disease, cough.  EXAM: CHEST  2 VIEW  COMPARISON:  12/05/2014  FINDINGS: The heart size and mediastinal contours are within normal limits. Both lungs are clear. The visualized skeletal structures are unremarkable.  IMPRESSION: No active cardiopulmonary disease.   Electronically Signed   By: Nolon Nations M.D.   On: 12/08/2014 16:48   Dg Chest 2 View  12/05/2014   CLINICAL DATA:  Acute onset of cough and fever.  Initial encounter.  EXAM: CHEST  2 VIEW  COMPARISON:  Chest radiograph performed 04/17/2014  FINDINGS: The lungs are well-aerated and clear. There is no evidence of focal opacification, pleural effusion or pneumothorax.  The heart is normal in size; the mediastinal contour is within normal limits. No acute osseous abnormalities are seen.  IMPRESSION: No acute cardiopulmonary process seen.   Electronically Signed   By: Garald Balding M.D.   On: 12/05/2014 20:17   Ct Head Wo Contrast  12/05/2014   CLINICAL DATA:  Headache with fever in generalized aches.  EXAM: CT HEAD WITHOUT CONTRAST  TECHNIQUE: Contiguous axial images were obtained from the base of the skull through the vertex without intravenous contrast.  COMPARISON:  None.  FINDINGS: Skull and Sinuses:There is complete opacification of the visible  left maxillary sinus, with diffuse opacification and fluid levels in the left ethmoid sinuses. The left frontal sinus is spared.  Orbits: No acute abnormality.  Brain: No evidence of acute infarction, hemorrhage, hydrocephalus, or mass lesion/mass effect. Generalized low cerebral  volume for age.  IMPRESSION: 1. Left frontoethmoidal sinusitis. 2. No acute intracranial disease. 3. Low cerebral volume for age.   Electronically Signed   By: Monte Fantasia M.D.   On: 12/05/2014 21:31    Oren Binet, MD  [facility] Pager:[phone]  If 7PM-7AM, please contact night-coverage [email or web address] Password [email or web address] 12/15/2014, 10:30 AM   LOS: 1 day

## 2014-12-15 NOTE — Progress Notes (Signed)
Called to get pt for his hemodialysis Tx. Nurse states pt continues to refuse to come today. Dr. Lorrene Reid aware that pt had been refusing and states if he continues to refuse to run him first shift in the morning. Informed nurse Johny Shock that we would move his Tx to first rounds due to him refusing. Pt OK with that.

## 2014-12-16 NOTE — Progress Notes (Signed)
PATIENT DETAILS Name: [name] Age: 44 y.o. Sex: male Date of Birth: [birth year] Admit Date: 12/13/2014 Admitting Physician Theressa Millard, MD PCP:No PCP Per Patient  Brief narrative:  44 year old African-American male with history of end-stage renal disease, hypertension, hepatitis B, history of parathyroidectomy presented with two-week history of worsening dyspnea, cough fever and intermittent diarrhea. He was seen by his nephrologist a few days prior to this admission and started on Z-Pak with no major response. Further evaluation in the emergency room showed pneumonia and was subsequently admitted for further evaluation and treatment  Subjective: Improving. Still weak.  Assessment/Plan: Principal Problem:   HCAP (healthcare-associated pneumonia): Admitted and started on vancomycin and cefepime. No longer having fever, but still looks pretty weak. Low much improved than on admission. She Continue IV antibiotics. Continues to have low-grade fever overnight,but clinically improved compared to on admission. Blood culture neg so far. Influenza PCR negative as well   Active Problems:   Sepsis: Secondary to above. Sepsis pathophysiology has resolved.  Continue Abx and follow culture till final    End-stage renal disease: On hemodialysis M,W,F.  Renal following    Mild hyperkalemia: resolved with HD. Monitor lytes periodically    Diarrhea: C. difficile PCR negative.Diarrhea has now resolved with supportive care.     Hypocalcemia: Defer to nephrology.    Hypertension: Stable, continue to hold oral antihypertensive agents.  Disposition: Remain inpatient-home in 1-2 days  Antibiotics:  See below   Anti-infectives    Start     Dose/Rate Route Frequency Ordered Stop   12/17/14 1800  ceFEPIme (MAXIPIME) 2 g in dextrose 5 % 50 mL IVPB     2 g 100 mL/hr over 30 Minutes Intravenous Every M-W-F (1800) 12/15/14 1420     12/17/14 1200  vancomycin (VANCOCIN) IVPB  750 mg/150 ml premix     750 mg 150 mL/hr over 60 Minutes Intravenous Every M-W-F (Hemodialysis) 12/15/14 1420     12/15/14 1800  ceFEPIme (MAXIPIME) 2 g in dextrose 5 % 50 mL IVPB  Status:  Discontinued     2 g 100 mL/hr over 30 Minutes Intravenous Every M-W-F (1800) 12/14/14 0752 12/14/14 2207   12/15/14 1800  ceFEPIme (MAXIPIME) 2 g in dextrose 5 % 50 mL IVPB  Status:  Discontinued     2 g 100 mL/hr over 30 Minutes Intravenous Every M-W-F (1800) 12/15/14 0939 12/15/14 1420   12/15/14 1600  vancomycin (VANCOCIN) IVPB 750 mg/150 ml premix  Status:  Discontinued     750 mg 150 mL/hr over 60 Minutes Intravenous Every M-W-F (Hemodialysis) 12/15/14 0939 12/15/14 1420   12/15/14 1200  vancomycin (VANCOCIN) IVPB 750 mg/150 ml premix  Status:  Discontinued     750 mg 150 mL/hr over 60 Minutes Intravenous Every M-W-F (Hemodialysis) 12/14/14 0752 12/14/14 2205   12/15/14 0500  vancomycin (VANCOCIN) IVPB 750 mg/150 ml premix     750 mg 150 mL/hr over 60 Minutes Intravenous  Once 12/14/14 2205 12/15/14 0713   12/15/14 0500  ceFEPIme (MAXIPIME) 2 g in dextrose 5 % 50 mL IVPB     2 g 100 mL/hr over 30 Minutes Intravenous  Once 12/14/14 2207 12/15/14 0743   12/14/14 0100  vancomycin (VANCOCIN) 1,500 mg in sodium chloride 0.9 % 500 mL IVPB     1,500 mg 250 mL/hr over 120 Minutes Intravenous  Once 12/14/14 0017 12/14/14 0321   12/14/14 0015  ceFEPIme (MAXIPIME) 1 g in dextrose 5 % 50  mL IVPB     1 g 100 mL/hr over 30 Minutes Intravenous  Once 12/14/14 0009 12/14/14 0118      DVT Prophylaxis: Prophylactic  Heparin   Code Status: Full code  Family Communication None at bedside  Procedures:  None  CONSULTS:  nephrology   MEDICATIONS: Scheduled Meds: . calcium carbonate  2 tablet Oral BID BM  . [START ON 12/17/2014] ceFEPime (MAXIPIME) IV  2 g Intravenous Q M,W,F-1800  . heparin  5,000 Units Subcutaneous 3 times per day  . sodium chloride  3 mL Intravenous Q12H  . [START ON  12/17/2014] vancomycin  750 mg Intravenous Q M,W,F-HD   Continuous Infusions:  PRN Meds:.sodium chloride, acetaminophen **OR** acetaminophen, HYDROmorphone (DILAUDID) injection, ondansetron **OR** ondansetron (ZOFRAN) IV, oxyCODONE, sodium chloride    PHYSICAL EXAM: Vital signs in last 24 hours: Filed Vitals:   12/15/14 0956 12/15/14 1408 12/15/14 2146 12/16/14 0545  BP: 113/70 132/87 125/81 118/79  Pulse: 84 66 71 78  Temp: 99 F (37.2 C) 98.6 F (37 C) 98.9 F (37.2 C) 98.8 F (37.1 C)  TempSrc: Oral Oral Oral Oral  Resp: 18 18 15 16   Weight:      SpO2: 92% 94% 98% 96%    Weight change:  Filed Weights   12/15/14 0040 12/15/14 0445 12/15/14 0555  Weight: 68 kg (149 lb 14.6 oz) 67.8 kg (149 lb 7.6 oz) 67.6 kg (149 lb 0.5 oz)   Body mass index is 20.79 kg/(m^2).   Gen Exam: Awake and alert with clear speech.  Lying comfortably without any distress Neck: Supple, No JVD.   Chest: B/L Clear.  A few bibasilar rales today CVS: S1 S2 Regular, no murmurs.  Abdomen: soft, BS +, non tender, non distended.  Extremities: no edema, lower extremities warm to touch. Neurologic: Non Focal.   Skin: No Rash.   Wounds: N/A.    Intake/Output from previous day: No intake or output data in the 24 hours ending 12/16/14 1053   LAB RESULTS: CBC  Recent Labs Lab 12/13/14 1718 12/14/14 0540 12/15/14 0608  WBC 8.3 9.2 8.6  HGB 10.5* 10.3* 10.5*  HCT 32.8* 33.2* 32.9*  PLT 141* 161 232  MCV 95.9 96.8 95.6  MCH 30.7 30.0 30.5  MCHC 32.0 31.0 31.9  RDW 14.1 14.4 14.2  LYMPHSABS 1.0  --   --   MONOABS 0.6  --   --   EOSABS 0.0  --   --   BASOSABS 0.1  --   --     Chemistries   Recent Labs Lab 12/13/14 1718 12/14/14 0540 12/15/14 0608  NA 140 140 141  K 4.9 5.7* 3.9  CL 94* 95* 98  CO2 21 23 26   GLUCOSE 90 93 87  BUN 77* 85* 26*  CREATININE 24.94* 24.99* 11.27*  CALCIUM 6.5* 6.2* 7.9*    CBG: No results for input(s): GLUCAP in the last 168 hours.  GFR CrCl cannot  be calculated (Unknown ideal weight.).  Coagulation profile No results for input(s): INR, PROTIME in the last 168 hours.  Cardiac Enzymes No results for input(s): CKMB, TROPONINI, MYOGLOBIN in the last 168 hours.  Invalid input(s): CK  Invalid input(s): POCBNP No results for input(s): DDIMER in the last 72 hours. No results for input(s): HGBA1C in the last 72 hours. No results for input(s): CHOL, HDL, LDLCALC, TRIG, CHOLHDL, LDLDIRECT in the last 72 hours. No results for input(s): TSH, T4TOTAL, T3FREE, THYROIDAB in the last 72 hours.  Invalid input(s): FREET3 No  results for input(s): VITAMINB12, FOLATE, FERRITIN, TIBC, IRON, RETICCTPCT in the last 72 hours.  Recent Labs  12/13/14 1718  LIPASE 43    Urine Studies No results for input(s): UHGB, CRYS in the last 72 hours.  Invalid input(s): UACOL, UAPR, USPG, UPH, UTP, UGL, UKET, UBIL, UNIT, UROB, ULEU, UEPI, UWBC, URBC, UBAC, CAST, UCOM, BILUA  MICROBIOLOGY: Recent Results (from the past 240 hour(s))  Blood culture (routine x 2)     Status: None   Collection Time: 12/08/14  5:43 PM  Result Value Ref Range Status   Specimen Description BLOOD ARM RIGHT  Final   Special Requests BOTTLES DRAWN AEROBIC AND ANAEROBIC 5CC  Final   Culture   Final    NO GROWTH 5 DAYS Performed at [facility]    Report Status 12/15/2014 FINAL  Final  Blood culture (routine x 2)     Status: None   Collection Time: 12/08/14  5:50 PM  Result Value Ref Range Status   Specimen Description BLOOD HAND RIGHT  Final   Special Requests BOTTLES DRAWN AEROBIC AND ANAEROBIC 5CC  Final   Culture   Final    NO GROWTH 5 DAYS Performed at [facility]    Report Status 12/15/2014 FINAL  Final  Blood culture (routine x 2)     Status: None (Preliminary result)   Collection Time: 12/13/14 12:05 AM  Result Value Ref Range Status   Specimen Description BLOOD RIGHT HAND  Final   Special Requests BOTTLES DRAWN AEROBIC AND ANAEROBIC 5CC  Final    Culture   Final           BLOOD CULTURE RECEIVED NO GROWTH TO DATE CULTURE WILL BE HELD FOR 5 DAYS BEFORE ISSUING A FINAL NEGATIVE REPORT Performed at [facility]    Report Status PENDING  Incomplete  Clostridium Difficile by PCR     Status: None   Collection Time: 12/14/14  4:34 AM  Result Value Ref Range Status   C difficile by pcr NEGATIVE NEGATIVE Final  Stool culture     Status: None (Preliminary result)   Collection Time: 12/14/14  4:34 AM  Result Value Ref Range Status   Specimen Description STOOL  Final   Special Requests NONE  Final   Culture   Final    NO SUSPICIOUS COLONIES, CONTINUING TO HOLD Performed at [facility]    Report Status PENDING  Incomplete    RADIOLOGY STUDIES/RESULTS: Dg Chest 2 View  12/13/2014   CLINICAL DATA:  Acute onset of fatigue. Dry lips and generalized weakness. Initial encounter.  EXAM: CHEST  2 VIEW  COMPARISON:  Chest radiograph performed 12/08/2014  FINDINGS: The lungs are well-aerated. Mild bibasilar airspace opacities, left greater than right, raise concern for pneumonia. This is particularly evident on the lateral view. There is no evidence of pleural effusion or pneumothorax.  The heart is normal in size; the mediastinal contour is within normal limits. No acute osseous abnormalities are seen.  IMPRESSION: Bibasilar pneumonia noted, more prominent on the left.   Electronically Signed   By: Garald Balding M.D.   On: 12/13/2014 23:23   Dg Chest 2 View  12/08/2014   CLINICAL DATA:  Fever for 1 week. Diarrhea for 1 day. History of hypertension, end-stage renal disease on dialysis. Thyroid disease, cough.  EXAM: CHEST  2 VIEW  COMPARISON:  12/05/2014  FINDINGS: The heart size and mediastinal contours are within normal limits. Both lungs are clear. The visualized skeletal structures are  unremarkable.  IMPRESSION: No active cardiopulmonary disease.   Electronically Signed   By: Nolon Nations M.D.   On: 12/08/2014 16:48   Dg Chest 2  View  12/05/2014   CLINICAL DATA:  Acute onset of cough and fever.  Initial encounter.  EXAM: CHEST  2 VIEW  COMPARISON:  Chest radiograph performed 04/17/2014  FINDINGS: The lungs are well-aerated and clear. There is no evidence of focal opacification, pleural effusion or pneumothorax.  The heart is normal in size; the mediastinal contour is within normal limits. No acute osseous abnormalities are seen.  IMPRESSION: No acute cardiopulmonary process seen.   Electronically Signed   By: Garald Balding M.D.   On: 12/05/2014 20:17   Ct Head Wo Contrast  12/05/2014   CLINICAL DATA:  Headache with fever in generalized aches.  EXAM: CT HEAD WITHOUT CONTRAST  TECHNIQUE: Contiguous axial images were obtained from the base of the skull through the vertex without intravenous contrast.  COMPARISON:  None.  FINDINGS: Skull and Sinuses:There is complete opacification of the visible left maxillary sinus, with diffuse opacification and fluid levels in the left ethmoid sinuses. The left frontal sinus is spared.  Orbits: No acute abnormality.  Brain: No evidence of acute infarction, hemorrhage, hydrocephalus, or mass lesion/mass effect. Generalized low cerebral volume for age.  IMPRESSION: 1. Left frontoethmoidal sinusitis. 2. No acute intracranial disease. 3. Low cerebral volume for age.   Electronically Signed   By: Monte Fantasia M.D.   On: 12/05/2014 21:31    Oren Binet, MD  [facility] Pager:[phone]  If 7PM-7AM, please contact night-coverage [email or web address] Password [email or web address] 12/16/2014, 10:53 AM   LOS: 2 days

## 2014-12-16 NOTE — Care Management (Signed)
IM from Medicare given . Magdalen Spatz RN BSN

## 2014-12-16 NOTE — Progress Notes (Signed)
  [facility] Progress Note    Assessment/ Plan:   1. Pneumonia - bilateral per CXR 3/13, BCs 3/7 negative, on Vancomycin & Cefepime. 2. Diarrhea - C diff negative. 3. ? Viral syndrome- but neg H1 N1, infu a and b / temp 100.1  on IV antib  4. ESRD - HD on MWF @ [facility], recent missed Txs X 2 last week ( mon./ Fri)sec to illness, K 3.7. He refused HD again yesterday as well as today. Will move him back to his MWF regimen and not try to make up for missed treatments last week. 5. Hypertension/volume - BP 123/82, no Current bp meds, takes Amlodipine 10 mg qhs at home; wt 67.6 kg, below EDW. 6. Anemia - Hgb 10.5, no meds. Follow CBC. 7. Sec HPT - s/p parathyroidectomy 04/24/14; Ca 7.9,( corrected 8.9) P 2.8, iPTH 22; Hectorol 1 mcg, Tums between meals.( no binders with meals ) 8. Nutrition - Alb 2.7, renal diet, vitamin. 9. Hepatitis B - isolation with HD.  Subjective:   Doing better with no diarrhea, dyspnea, chest pain, fevers. But feels drained. He is ambulating.   Objective:   BP 118/79 mmHg  Pulse 78  Temp(Src) 98.8 F (37.1 C) (Oral)  Resp 16  Wt 67.6 kg (149 lb 0.5 oz)  SpO2 96% No intake or output data in the 24 hours ending 12/16/14 1022 Weight change:   Physical Exam: General : alert,nad LUNGS: Faint R rhonchi  Cardio: RRR, no rub, mur, or gallop; left femoral loop AVG + bruit. GI: soft, non-tender; BS pos nl, NT, nD Extremities: extremities normal, atraumatic, no cyanosis or edema Dialysis Access: L FEm AVG + bruit   Imaging: No results found.  Labs: BMET  Recent Labs Lab 12/13/14 1718 12/14/14 0540 12/15/14 0608  NA 140 140 141  K 4.9 5.7* 3.9  CL 94* 95* 98  CO2 21 23 26   GLUCOSE 90 93 87  BUN 77* 85* 26*  CREATININE 24.94* 24.99* 11.27*  CALCIUM 6.5* 6.2* 7.9*  PHOS  --   --  2.8   CBC  Recent Labs Lab 12/13/14 1718 12/14/14 0540 12/15/14 0608  WBC 8.3 9.2 8.6  NEUTROABS 6.6  --   --   HGB 10.5* 10.3* 10.5*  HCT 32.8*  33.2* 32.9*  MCV 95.9 96.8 95.6  PLT 141* 161 232    Medications:    . calcium carbonate  2 tablet Oral BID BM  . [START ON 12/17/2014] ceFEPime (MAXIPIME) IV  2 g Intravenous Q M,W,F-1800  . heparin  5,000 Units Subcutaneous 3 times per day  . sodium chloride  3 mL Intravenous Q12H  . [START ON 12/17/2014] vancomycin  750 mg Intravenous Q M,W,F-HD      Otelia Santee, MD 12/16/2014, 10:22 AM

## 2014-12-17 ENCOUNTER — Encounter (HOSPITAL_COMMUNITY): Payer: Self-pay

## 2014-12-17 LAB — RENAL FUNCTION PANEL
Albumin: 2.5 g/dL — ABNORMAL LOW (ref 3.5–5.2)
Anion gap: 16 — ABNORMAL HIGH (ref 5–15)
BUN: 55 mg/dL — ABNORMAL HIGH (ref 6–23)
CALCIUM: 6.4 mg/dL — AB (ref 8.4–10.5)
CO2: 26 mmol/L (ref 19–32)
CREATININE: 19.22 mg/dL — AB (ref 0.50–1.35)
Chloride: 97 mmol/L (ref 96–112)
GFR calc Af Amer: 3 mL/min — ABNORMAL LOW (ref >90)
GFR, EST NON AFRICAN AMERICAN: 3 mL/min — AB (ref >90)
Glucose, Bld: 69 mg/dL — ABNORMAL LOW (ref 70–99)
Phosphorus: 6.3 mg/dL — ABNORMAL HIGH (ref 2.3–4.6)
Potassium: 4.7 mmol/L (ref 3.5–5.1)
Sodium: 139 mmol/L (ref 135–145)

## 2014-12-17 LAB — CBC
HCT: 31.6 % — ABNORMAL LOW (ref 39.0–52.0)
Hemoglobin: 10.1 g/dL — ABNORMAL LOW (ref 13.0–17.0)
MCH: 30.7 pg (ref 26.0–34.0)
MCHC: 32 g/dL (ref 30.0–36.0)
MCV: 96 fL (ref 78.0–100.0)
PLATELETS: 329 10*3/uL (ref 150–400)
RBC: 3.29 MIL/uL — ABNORMAL LOW (ref 4.22–5.81)
RDW: 13.8 % (ref 11.5–15.5)
WBC: 4.9 10*3/uL (ref 4.0–10.5)

## 2014-12-17 MED ORDER — DOXERCALCIFEROL 4 MCG/2ML IV SOLN
2.0000 ug | INTRAVENOUS | Status: DC
  Administered 2014-12-17 – 2014-12-19 (×2): 2 ug via INTRAVENOUS
  Filled 2014-12-17: qty 2

## 2014-12-17 MED ORDER — ACETAMINOPHEN 325 MG PO TABS
ORAL_TABLET | ORAL | Status: AC
  Administered 2014-12-17: 650 mg via ORAL
  Filled 2014-12-17: qty 2

## 2014-12-17 MED ORDER — DOXERCALCIFEROL 4 MCG/2ML IV SOLN
INTRAVENOUS | Status: AC
  Administered 2014-12-17: 2 ug via INTRAVENOUS
  Filled 2014-12-17: qty 4

## 2014-12-17 NOTE — Progress Notes (Signed)
Patient ID: [name], male   DOB: 06-11-1971, 44 y.o.   MRN: [id number]  [facility] PROGRESS NOTE  [name] [id number] DOB: 1971/02/19 DOA: 12/13/2014 PCP: No PCP Per Patient   Brief narrative:    44 year old African-American male with history of end-stage renal disease, hypertension, hepatitis B, history of parathyroidectomy presented with two-week history of worsening dyspnea, cough, fever, and intermittent diarrhea. He was seen by his nephrologist a few days prior to this admission and started on Z-Pak with no major response. Further evaluation in the emergency room showed pneumonia and was subsequently admitted for further evaluation and treatment.  Assessment/Plan:    Principal Problem:   HCAP (healthcare-associated pneumonia) - pt is clinically improving, reports feeling better - continue current ABX regimen and plan to narrow down in next 24 hours if continue clinical improvement noted  - continue to provide supportive care with BD's as needed  Active Problems:   Sepsis - secondary to the above - resolved and pt is now clinically stable    Hyperparathyroidism, secondary - stable    Hyperkalemia - resolved    ESRD on dialysis - nephrology team following, appreciate assistance    Hypotension - BP stable, 126/87 - continue to hold Norvasc and Clonidine that pt takes at home - possible to resume the meds on d/c if BP stabilized    Diarrhea - C. Diff negative - resolved    Hypocalcemia - continue supplementation    Anemia of chronic disease, ESRD - Hg stable, no signs of active bleeding   DVT prophylaxis: Heparin SQ  Code Status: Full.  Family Communication:  plan of care discussed with the patient Disposition Plan: Home likely in 1-2 days   IV access:  Peripheral IV  Procedures and diagnostic studies:    CXR  12/13/2014   Bibasilar pneumonia noted, more prominent on the left.   CXR  12/08/2014   No active cardiopulmonary disease.    CXR  12/05/2014   No acute cardiopulmonary process seen.     Medical Consultants:  Nephrology   Other Consultants:  None  IAnti-Infectives:   Vancomycin 3/14 --> Cefepime 3/14 -->  Faye Ramsay, MD  [facility] Pager [phone]  If 7PM-7AM, please contact night-coverage [email or web address] Password [facility] 12/17/2014, 4:53 PM   LOS: 3 days   HPI/Subjective: No events overnight.   Objective: Filed Vitals:   12/17/14 1200 12/17/14 1230 12/17/14 1319 12/17/14 1330  BP: 132/78 129/89  136/81  Pulse: 56 75  81  Temp:  98.1 F (36.7 C)  98.1 F (36.7 C)  TempSrc:  Oral  Oral  Resp: 16 15  16   Height:   5\' 11"  (1.803 m)   Weight:   65 kg (143 lb 4.8 oz)   SpO2:  99%  99%    Intake/Output Summary (Last 24 hours) at 12/17/14 1653 Last data filed at 12/17/14 1500  Gross per 24 hour  Intake    393 ml  Output      0 ml  Net    393 ml    Exam:   General:  Pt is alert, follows commands appropriately, not in acute distress  Cardiovascular: Regular rate and rhythm, S1/S2, no murmurs, no rubs, no gallops  Respiratory: Clear to auscultation bilaterally, minimal rhonchi at bases   Abdomen: Soft, non tender, non distended, bowel sounds present, no guarding  Extremities: pulses DP and PT palpable bilaterally  Neuro: Grossly nonfocal  Data Reviewed: Basic Metabolic Panel:  Recent Labs Lab 12/13/14 1718  12/14/14 0540 12/15/14 0608 12/17/14 1143  NA 140 140 141 139  K 4.9 5.7* 3.9 4.7  CL 94* 95* 98 97  CO2 21 23 26 26   GLUCOSE 90 93 87 69*  BUN 77* 85* 26* 55*  CREATININE 24.94* 24.99* 11.27* 19.22*  CALCIUM 6.5* 6.2* 7.9* 6.4*  PHOS  --   --  2.8 6.3*   Liver Function Tests:  Recent Labs Lab 12/13/14 1718 12/15/14 0608 12/17/14 1143  AST 35  --   --   ALT 22  --   --   ALKPHOS 52  --   --   BILITOT 1.0  --   --   PROT 8.0  --   --   ALBUMIN 3.2* 2.7* 2.5*    Recent Labs Lab 12/13/14 1718  LIPASE 43   CBC:  Recent Labs Lab 12/13/14 1718  12/14/14 0540 12/15/14 0608 12/17/14 1143  WBC 8.3 9.2 8.6 4.9  NEUTROABS 6.6  --   --   --   HGB 10.5* 10.3* 10.5* 10.1*  HCT 32.8* 33.2* 32.9* 31.6*  MCV 95.9 96.8 95.6 96.0  PLT 141* 161 232 329   Recent Results (from the past 240 hour(s))  Blood culture (routine x 2)     Status: None   Collection Time: 12/08/14  5:43 PM  Result Value Ref Range Status   Specimen Description BLOOD ARM RIGHT  Final   Special Requests BOTTLES DRAWN AEROBIC AND ANAEROBIC 5CC  Final   Culture   Final    NO GROWTH 5 DAYS Performed at [facility]    Report Status 12/15/2014 FINAL  Final  Blood culture (routine x 2)     Status: None   Collection Time: 12/08/14  5:50 PM  Result Value Ref Range Status   Specimen Description BLOOD HAND RIGHT  Final   Special Requests BOTTLES DRAWN AEROBIC AND ANAEROBIC 5CC  Final   Culture   Final    NO GROWTH 5 DAYS Performed at [facility]    Report Status 12/15/2014 FINAL  Final  Blood culture (routine x 2)     Status: None (Preliminary result)   Collection Time: 12/13/14 12:05 AM  Result Value Ref Range Status   Specimen Description BLOOD RIGHT HAND  Final   Special Requests BOTTLES DRAWN AEROBIC AND ANAEROBIC 5CC  Final   Culture   Final           BLOOD CULTURE RECEIVED NO GROWTH TO DATE CULTURE WILL BE HELD FOR 5 DAYS BEFORE ISSUING A FINAL NEGATIVE REPORT Performed at [facility]    Report Status PENDING  Incomplete  Clostridium Difficile by PCR     Status: None   Collection Time: 12/14/14  4:34 AM  Result Value Ref Range Status   C difficile by pcr NEGATIVE NEGATIVE Final  Stool culture     Status: None (Preliminary result)   Collection Time: 12/14/14  4:34 AM  Result Value Ref Range Status   Specimen Description STOOL  Final   Special Requests NONE  Final   Culture   Final    NO SUSPICIOUS COLONIES, CONTINUING TO HOLD Performed at [facility]    Report Status PENDING  Incomplete     Scheduled Meds: .  calcium carbonate  2 tablet Oral BID BM  . ceFEPime IV  2 g Intravenous Q M,W,F-1800  . heparin  5,000 Units Subcutaneous 3 times per day  . vancomycin  750 mg Intravenous Q M,W,F-HD  Continuous Infusions:

## 2014-12-17 NOTE — Progress Notes (Signed)
Patient was seen on dialysis and the procedure was supervised.   BFR 400 Via lt fem loop AVG  BP is 105/60.  AP -150  VP 190 UF 500 ml Bath 2/2.5   Patient appears to be tolerating treatment well.  He actually feels much better today. He denies n/v/diarrhea or dyspnea.  Otelia Santee, MD 12/17/2014, 10:01 AM

## 2014-12-17 NOTE — Progress Notes (Signed)
ANTIBIOTIC CONSULT NOTE - Follow-up  Pharmacy Consult for Vancomycin + Cefepime Indication: pneumonia  No Known Allergies  Patient Measurements: Estimated weight 70 kg (71 kg on 07/14/14) Weight: 149 lb 0.5 oz (67.6 kg)Need to obtain current weight this admit  Vital Signs: Temp: 98.2 F (36.8 C) (03/16 0516) Temp Source: Oral (03/16 0516) BP: 118/76 mmHg (03/16 0516) Pulse Rate: 70 (03/16 0516) Intake/Output from previous day: 03/15 0701 - 03/16 0700 In: 240 [P.O.:240] Out: 0  Intake/Output from this shift:    Labs:  Recent Labs  12/15/14 0608  WBC 8.6  HGB 10.5*  PLT 232  CREATININE 11.27*   CrCl cannot be calculated (Unknown ideal weight.). No results for input(s): VANCOTROUGH, VANCOPEAK, VANCORANDOM, GENTTROUGH, GENTPEAK, GENTRANDOM, TOBRATROUGH, TOBRAPEAK, TOBRARND, AMIKACINPEAK, AMIKACINTROU, AMIKACIN in the last 72 hours.   Microbiology: Recent Results (from the past 720 hour(s))  Blood culture (routine x 2)     Status: None   Collection Time: 12/08/14  5:43 PM  Result Value Ref Range Status   Specimen Description BLOOD ARM RIGHT  Final   Special Requests BOTTLES DRAWN AEROBIC AND ANAEROBIC 5CC  Final   Culture   Final    NO GROWTH 5 DAYS Performed at [facility]    Report Status 12/15/2014 FINAL  Final  Blood culture (routine x 2)     Status: None   Collection Time: 12/08/14  5:50 PM  Result Value Ref Range Status   Specimen Description BLOOD HAND RIGHT  Final   Special Requests BOTTLES DRAWN AEROBIC AND ANAEROBIC 5CC  Final   Culture   Final    NO GROWTH 5 DAYS Performed at [facility]    Report Status 12/15/2014 FINAL  Final  Blood culture (routine x 2)     Status: None (Preliminary result)   Collection Time: 12/13/14 12:05 AM  Result Value Ref Range Status   Specimen Description BLOOD RIGHT HAND  Final   Special Requests BOTTLES DRAWN AEROBIC AND ANAEROBIC 5CC  Final   Culture   Final           BLOOD CULTURE RECEIVED NO  GROWTH TO DATE CULTURE WILL BE HELD FOR 5 DAYS BEFORE ISSUING A FINAL NEGATIVE REPORT Performed at [facility]    Report Status PENDING  Incomplete  Clostridium Difficile by PCR     Status: None   Collection Time: 12/14/14  4:34 AM  Result Value Ref Range Status   C difficile by pcr NEGATIVE NEGATIVE Final  Stool culture     Status: None (Preliminary result)   Collection Time: 12/14/14  4:34 AM  Result Value Ref Range Status   Specimen Description STOOL  Final   Special Requests NONE  Final   Culture   Final    NO SUSPICIOUS COLONIES, CONTINUING TO HOLD Performed at [facility]    Report Status PENDING  Incomplete   Assessment: 44 y.o male with with a history of ESRD on HD MWF, continues on D#4 of vancomycin + cefepime for treatment of HCAP. Pt is afebrile and last WBC was WNL. Last HD early AM of 3/14. Planning to resume outpatient HD regimen of MWF today. Recently treated with azithromycin. Lactic acid 1.39.  Vanc 3/13>> Cefepime 3/13>>  3/13 Cdiff - NEG 3/12 Blood - NGTD  Goal of Therapy:  Pre dialysis vancomycin level 15-25 mcg/ml  Plan: - Continue Vancomycin 750mg  post-HD MWF - Continue Cefepime 2mg  post-HD MWF - F/u renal plans, C&S, clinical status and pre-HD level  when appropriate  Salome Arnt, PharmD, BCPS Pager # [phone] 12/17/2014 8:10 AM

## 2014-12-18 ENCOUNTER — Encounter (HOSPITAL_COMMUNITY): Payer: Self-pay | Admitting: Surgery

## 2014-12-18 LAB — STOOL CULTURE

## 2014-12-18 MED ORDER — CALCIUM CARBONATE ANTACID 500 MG PO CHEW
2.0000 | CHEWABLE_TABLET | Freq: Three times a day (TID) | ORAL | Status: DC
  Administered 2014-12-18 – 2014-12-20 (×5): 400 mg via ORAL
  Filled 2014-12-18 (×5): qty 1

## 2014-12-18 MED ORDER — LEVOFLOXACIN 750 MG PO TABS
750.0000 mg | ORAL_TABLET | Freq: Once | ORAL | Status: AC
  Administered 2014-12-18: 750 mg via ORAL
  Filled 2014-12-18: qty 1

## 2014-12-18 MED ORDER — SENNOSIDES-DOCUSATE SODIUM 8.6-50 MG PO TABS: 1.0000 | ORAL_TABLET | ORAL | Status: DC | PRN

## 2014-12-18 MED ORDER — CALCIUM CARBONATE ANTACID 500 MG PO CHEW
3.0000 | CHEWABLE_TABLET | Freq: Two times a day (BID) | ORAL | Status: DC
  Administered 2014-12-18 – 2014-12-20 (×3): 600 mg via ORAL
  Filled 2014-12-18 (×2): qty 2

## 2014-12-18 MED ORDER — LEVOFLOXACIN 500 MG PO TABS
500.0000 mg | ORAL_TABLET | ORAL | Status: DC
Start: 2014-12-20 — End: 2014-12-20
  Filled 2014-12-18: qty 1

## 2014-12-18 NOTE — Progress Notes (Signed)
Subjective:  Feels better tolerated hd last pm / lives alone has not ambulated yet  Objective Vital signs in last 24 hours: Filed Vitals:   12/17/14 2233 12/18/14 0116 12/18/14 0549 12/18/14 1009  BP: 131/88 126/90 131/84 126/84  Pulse: 64 65 65 79  Temp: 98.6 F (37 C) 98.4 F (36.9 C) 98 F (36.7 C) 98.2 F (36.8 C)  TempSrc: Oral Oral Oral Oral  Resp: 17 15 16 16   Height:      Weight:      SpO2: 100% 100% 100% 98%   Weight change:  Physical Exam: General : alert,nad LUNGS: CTA bilat now Cardio: RRR, no rub, mur, or gallop GI: soft, non-tender; BS pos nl, NT, ND Extremities: no pedal  edema Dialysis Access: L FEm AVG + bruit    OP Dialysis Orders: MWF @ [facility] 3:45 71.5 kg 2K/2.5Ca 400/A1.5 Profile 4 Heparin 1500 U L thigh AVG  Hectorol 1 mcg No Aranesp or Venofer  Problem/Plan: 1. Pneumonia - bilateral per CXR 3/13, BCs 3/7 negative,prior Vancomycin & Cefepime. Now changed to po antibio. 2. Diarrhea - C diff negative. 3. ESRD - HD on MWF @ [facility], recent missed Txs X 2 last week ( mon./ Fri)sec to illness, K 3.7. HD later today to keep on Schedule . 4. Hypertension/volume - BP 126/84, no bp meds  in hosp/, was on  Amlodipine 10 mg qhs at home; wt 65kg yest below EDW. 5. Anemia - Hgb 10.5, no meds. Follow CBC. 6. 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 Nutrition - Alb 2.5, renal diet, vitamin. Add breeze supplement  Starting to eat better now with PNA better 8. Hepatitis B - isolation with HD.   Ernest Haber, PA-C [facility] Beeper [phone] 12/18/2014,11:07 AM  LOS: 4 days  I have seen and examined this patient and agree with the plan of care. Doing better and actually more energy. Will HD in the AM to maintain MWF outpt  regimen.   Dwana Melena, MD 12/18/2014, 3:12 PM  Labs: Basic Metabolic Panel:  Recent Labs Lab 12/14/14 0540 12/15/14 0608 12/17/14 1143  NA 140 141 139  K 5.7* 3.9 4.7  CL 95* 98 97  CO2 23 26 26   GLUCOSE 93 87 69*  BUN 85* 26* 55*  CREATININE 24.99* 11.27* 19.22*  CALCIUM 6.2* 7.9* 6.4*  PHOS  --  2.8 6.3*   Liver Function Tests:  Recent Labs Lab 12/13/14 1718 12/15/14 0608 12/17/14 1143  AST 35  --   --   ALT 22  --   --   ALKPHOS 52  --   --   BILITOT 1.0  --   --   PROT 8.0  --   --   ALBUMIN 3.2* 2.7* 2.5*    Recent Labs Lab 12/13/14 1718  LIPASE 43   No results for input(s): AMMONIA in the last 168 hours. CBC:  Recent Labs Lab 12/13/14 1718 12/14/14 0540 12/15/14 0608 12/17/14 1143  WBC 8.3 9.2 8.6 4.9  NEUTROABS 6.6  --   --   --   HGB 10.5* 10.3* 10.5* 10.1*  HCT 32.8* 33.2* 32.9* 31.6*  MCV 95.9 96.8 95.6 96.0  PLT 141* 161 232 329   Cardiac Enzymes: No results for  input(s): CKTOTAL, CKMB, CKMBINDEX, TROPONINI in the last 168 hours. CBG: No results for input(s): GLUCAP in the last 168 hours.  Studies/Results: No results found. Medications:   . calcium carbonate  2 tablet Oral BID BM  . [START ON 12/19/2014] doxercalciferol  2 mcg Intravenous Q M,W,F-HD  . heparin  5,000 Units Subcutaneous 3 times per day  . sodium chloride  3 mL Intravenous Q12H

## 2014-12-18 NOTE — Evaluation (Signed)
Physical Therapy Evaluation Patient Details Name: [name] MRN: [id number] DOB: 1970/10/27 Today's Date: 12/18/2014   History of Present Illness  Patient is a 44 y/o male w/ history of ESRD on HD (MWF) who presents to the ED with complaints of worsening SOB, and increased cough for 2 weeks.He was seen in the ED on 03/04 and 03/07 and diagnosed with a viral illness and was given Azithromycin. Pt returned due to worsening of symptoms and a chest x-ray revealed bibasilarpneumonia.   Clinical Impression   Patient presents with mild weakness due to hospitalization and being in bed since admission. Anticipate strength and mobility will improved with increased activity. No LOB during gait training. Encouraged ambulation a few times per day with tech for safety to maintain strength. Pt does not require skilled therapy services as pt functioning close to baseline. Required max encouragement to participate in PT eval. Discharge from therapy.    Follow Up Recommendations No PT follow up;Supervision - Intermittent    Equipment Recommendations  None recommended by PT    Recommendations for Other Services       Precautions / Restrictions Precautions Precautions: None Restrictions Weight Bearing Restrictions: No      Mobility  Bed Mobility               General bed mobility comments: Sitting on couch near window upon PT arrival.   Transfers Overall transfer level: Independent               General transfer comment: Stood from couch x1.   Ambulation/Gait Ambulation/Gait assistance: Modified independent (Device/Increase time) Ambulation Distance (Feet): 510 Feet Assistive device: None Gait Pattern/deviations: Step-through pattern;Drifts right/left   Gait velocity interpretation: at or above normal speed for age/gender General Gait Details: Pt with mostly steady gait- improved with increased distance. Reports mild dizziness towards end of gait. No overt LOB.    Stairs            Wheelchair Mobility    Modified Rankin (Stroke Patients Only)       Balance Overall balance assessment: Needs assistance Sitting-balance support: Feet supported;No upper extremity supported Sitting balance-Leahy Scale: Good     Standing balance support: During functional activity Standing balance-Leahy Scale: Fair                               Pertinent Vitals/Pain Pain Assessment: No/denies pain    Home Living Family/patient expects to be discharged to:: Private residence Living Arrangements: Alone   Type of Home: Apartment Home Access: Level entry     Home Layout: One level Home Equipment: None      Prior Function Level of Independence: Independent         Comments: Works at [employer]        Extremity/Trunk Assessment   Upper Extremity Assessment: Overall WFL for tasks assessed           Lower Extremity Assessment: Generalized weakness         Communication   Communication: No difficulties  Cognition Arousal/Alertness: Awake/alert Behavior During Therapy: WFL for tasks assessed/performed Overall Cognitive Status: Within Functional Limits for tasks assessed                      General Comments      Exercises        Assessment/Plan    PT Assessment Patent does not need  any further PT services  PT Diagnosis     PT Problem List    PT Treatment Interventions     PT Goals (Current goals can be found in the Care Plan section) Acute Rehab PT Goals PT Goal Formulation: All assessment and education complete, DC therapy    Frequency     Barriers to discharge        Co-evaluation               End of Session Equipment Utilized During Treatment: Gait belt Activity Tolerance: Patient tolerated treatment well Patient left: in chair;with call bell/phone within reach Nurse Communication: Mobility status         Time: [id number] PT Time Calculation (min)  (ACUTE ONLY): 14 min   Charges:   PT Evaluation $Initial PT Evaluation Tier I: 1 Procedure     PT G CodesCandy Sledge A 12/23/2014, 4:52 PM  Candy Sledge, PT, DPT [phone]

## 2014-12-18 NOTE — Progress Notes (Addendum)
Patient ID: [name], male   DOB: 12/19/1970, 44 y.o.   MRN: [id number]  [facility] PROGRESS NOTE  [name] [id number] DOB: 1970/10/07 DOA: 12/13/2014 PCP: No PCP Per Patient   Brief narrative:    44 year old African-American male with history of end-stage renal disease, hypertension, hepatitis B, history of parathyroidectomy presented with two-week history of worsening dyspnea, cough, fever, and intermittent diarrhea. He was seen by his nephrologist a few days prior to this admission and started on Z-Pak with no major response. Further evaluation in the emergency room showed pneumonia and was subsequently admitted for further evaluation and treatment.  Assessment/Plan:    Principal Problem:   HCAP (healthcare-associated pneumonia) - pt is clinically improving, reports feeling better - started on Vanc and Cefepime initially, will transition to oral Levaquin today 3/17 - continue to provide supportive care with BD's as needed  - ambulate today and check oxygen saturations with ambulation  Active Problems:   Sepsis - secondary to the above - resolved and pt is now clinically stable    Hyperparathyroidism, secondary - stable    Hyperkalemia - resolved    ESRD on dialysis - nephrology team following, appreciate assistance    Hypotension - BP remains stable  - continue to hold Norvasc and Clonidine that pt takes at home - possible to resume the meds on d/c if BP remains stable    Diarrhea - C. Diff negative - resolved    Hypocalcemia - continue supplementation    Anemia of chronic disease, ESRD - no signs of active bleeding   DVT prophylaxis: Heparin SQ  Code Status: Full.  Family Communication:  plan of care discussed with the patient Disposition Plan: Home in AM  IV access:  Peripheral IV  Procedures and diagnostic studies:    CXR  12/13/2014   Bibasilar pneumonia noted, more prominent on the left.   CXR  12/08/2014   No active  cardiopulmonary disease.   CXR  12/05/2014   No acute cardiopulmonary process seen.     Medical Consultants:  Nephrology   Other Consultants:  PT  IAnti-Infectives:   Vancomycin 3/14 --> 3/17 Cefepime 3/14 --> 3/17 Levaquin 3/17 -->  Faye Ramsay, MD  [facility] Pager [phone]  If 7PM-7AM, please contact night-coverage [email or web address] Password [facility] 12/18/2014, 10:23 AM   LOS: 4 days   HPI/Subjective: No events overnight.   Objective: Filed Vitals:   12/17/14 2233 12/18/14 0116 12/18/14 0549 12/18/14 1009  BP: 131/88 126/90 131/84 126/84  Pulse: 64 65 65 79  Temp: 98.6 F (37 C) 98.4 F (36.9 C) 98 F (36.7 C) 98.2 F (36.8 C)  TempSrc: Oral Oral Oral Oral  Resp: 17 15 16 16   Height:      Weight:      SpO2: 100% 100% 100% 98%    Intake/Output Summary (Last 24 hours) at 12/18/14 1023 Last data filed at 12/17/14 1500  Gross per 24 hour  Intake    273 ml  Output      0 ml  Net    273 ml    Exam:   General:  Pt is alert, follows commands appropriately, not in acute distress  Cardiovascular: Regular rate and rhythm, S1/S2, no murmurs, no rubs, no gallops  Respiratory: Clear to auscultation bilaterally, no rhonchi   Abdomen: Soft, non tender, non distended, bowel sounds present, no guarding  Extremities: pulses DP and PT palpable bilaterally   Data Reviewed: Basic Metabolic Panel:  Recent Labs Lab 12/13/14 1718 12/14/14  0540 12/15/14 0608 12/17/14 1143  NA 140 140 141 139  K 4.9 5.7* 3.9 4.7  CL 94* 95* 98 97  CO2 21 23 26 26   GLUCOSE 90 93 87 69*  BUN 77* 85* 26* 55*  CREATININE 24.94* 24.99* 11.27* 19.22*  CALCIUM 6.5* 6.2* 7.9* 6.4*  PHOS  --   --  2.8 6.3*   Liver Function Tests:  Recent Labs Lab 12/13/14 1718 12/15/14 0608 12/17/14 1143  AST 35  --   --   ALT 22  --   --   ALKPHOS 52  --   --   BILITOT 1.0  --   --   PROT 8.0  --   --   ALBUMIN 3.2* 2.7* 2.5*    Recent Labs Lab 12/13/14 1718  LIPASE 43    CBC:  Recent Labs Lab 12/13/14 1718 12/14/14 0540 12/15/14 0608 12/17/14 1143  WBC 8.3 9.2 8.6 4.9  NEUTROABS 6.6  --   --   --   HGB 10.5* 10.3* 10.5* 10.1*  HCT 32.8* 33.2* 32.9* 31.6*  MCV 95.9 96.8 95.6 96.0  PLT 141* 161 232 329   Recent Results (from the past 240 hour(s))  Blood culture (routine x 2)     Status: None   Collection Time: 12/08/14  5:43 PM  Result Value Ref Range Status   Specimen Description BLOOD ARM RIGHT  Final   Special Requests BOTTLES DRAWN AEROBIC AND ANAEROBIC 5CC  Final   Culture   Final    NO GROWTH 5 DAYS Performed at [facility]    Report Status 12/15/2014 FINAL  Final  Blood culture (routine x 2)     Status: None   Collection Time: 12/08/14  5:50 PM  Result Value Ref Range Status   Specimen Description BLOOD HAND RIGHT  Final   Special Requests BOTTLES DRAWN AEROBIC AND ANAEROBIC 5CC  Final   Culture   Final    NO GROWTH 5 DAYS Performed at [facility]    Report Status 12/15/2014 FINAL  Final  Blood culture (routine x 2)     Status: None (Preliminary result)   Collection Time: 12/13/14 12:05 AM  Result Value Ref Range Status   Specimen Description BLOOD RIGHT HAND  Final   Special Requests BOTTLES DRAWN AEROBIC AND ANAEROBIC 5CC  Final   Culture   Final           BLOOD CULTURE RECEIVED NO GROWTH TO DATE CULTURE WILL BE HELD FOR 5 DAYS BEFORE ISSUING A FINAL NEGATIVE REPORT Performed at [facility]    Report Status PENDING  Incomplete  Clostridium Difficile by PCR     Status: None   Collection Time: 12/14/14  4:34 AM  Result Value Ref Range Status   C difficile by pcr NEGATIVE NEGATIVE Final  Stool culture     Status: None   Collection Time: 12/14/14  4:34 AM  Result Value Ref Range Status   Specimen Description STOOL  Final   Special Requests NONE  Final   Culture   Final    NO SALMONELLA, SHIGELLA, CAMPYLOBACTER, YERSINIA, OR E.COLI 0157:H7 ISOLATED Note: REDUCED NORMAL FLORA PRESENT Performed  at [facility]    Report Status 12/18/2014 FINAL  Final     Scheduled Meds: . calcium carbonate  2 tablet Oral BID BM  . ceFEPime IV  2 g Intravenous Q M,W,F-1800  . heparin  5,000 Units Subcutaneous 3 times per day  . vancomycin  750 mg Intravenous Q M,W,F-HD   Continuous Infusions:

## 2014-12-18 NOTE — Progress Notes (Signed)
ANTIBIOTIC CONSULT NOTE - Follow-up  Pharmacy Consult for levaquin Indication: HCAP  No Known Allergies  Patient Measurements: Estimated weight 70 kg (71 kg on 07/14/14) Height: 5\' 11"  (180.3 cm) Weight: 143 lb 4.8 oz (65 kg) IBW/kg (Calculated) : 75.3 Vital Signs: Temp: 98.2 F (36.8 C) (03/17 1009) Temp Source: Oral (03/17 1009) BP: 126/84 mmHg (03/17 1009) Pulse Rate: 79 (03/17 1009) Intake/Output from previous day: 03/16 0701 - 03/17 0700 In: 273 [P.O.:120; I.V.:3; IV Piggyback:150] Out: 0  Intake/Output from this shift: Total I/O In: 120 [P.O.:120] Out: -   Labs:  Recent Labs  12/17/14 1143  WBC 4.9  HGB 10.1*  PLT 329  CREATININE 19.22*   Estimated Creatinine Clearance: 4.6 mL/min (by C-G formula based on Cr of 19.22). No results for input(s): VANCOTROUGH, VANCOPEAK, VANCORANDOM, GENTTROUGH, GENTPEAK, GENTRANDOM, TOBRATROUGH, TOBRAPEAK, TOBRARND, AMIKACINPEAK, AMIKACINTROU, AMIKACIN in the last 72 hours.   Microbiology: Recent Results (from the past 720 hour(s))  Blood culture (routine x 2)     Status: None   Collection Time: 12/08/14  5:43 PM  Result Value Ref Range Status   Specimen Description BLOOD ARM RIGHT  Final   Special Requests BOTTLES DRAWN AEROBIC AND ANAEROBIC 5CC  Final   Culture   Final    NO GROWTH 5 DAYS Performed at [facility]    Report Status 12/15/2014 FINAL  Final  Blood culture (routine x 2)     Status: None   Collection Time: 12/08/14  5:50 PM  Result Value Ref Range Status   Specimen Description BLOOD HAND RIGHT  Final   Special Requests BOTTLES DRAWN AEROBIC AND ANAEROBIC 5CC  Final   Culture   Final    NO GROWTH 5 DAYS Performed at [facility]    Report Status 12/15/2014 FINAL  Final  Blood culture (routine x 2)     Status: None (Preliminary result)   Collection Time: 12/13/14 12:05 AM  Result Value Ref Range Status   Specimen Description BLOOD RIGHT HAND  Final   Special Requests BOTTLES DRAWN  AEROBIC AND ANAEROBIC 5CC  Final   Culture   Final           BLOOD CULTURE RECEIVED NO GROWTH TO DATE CULTURE WILL BE HELD FOR 5 DAYS BEFORE ISSUING A FINAL NEGATIVE REPORT Performed at [facility]    Report Status PENDING  Incomplete  Clostridium Difficile by PCR     Status: None   Collection Time: 12/14/14  4:34 AM  Result Value Ref Range Status   C difficile by pcr NEGATIVE NEGATIVE Final  Stool culture     Status: None   Collection Time: 12/14/14  4:34 AM  Result Value Ref Range Status   Specimen Description STOOL  Final   Special Requests NONE  Final   Culture   Final    NO SALMONELLA, SHIGELLA, CAMPYLOBACTER, YERSINIA, OR E.COLI 0157:H7 ISOLATED Note: REDUCED NORMAL FLORA PRESENT Performed at [facility]    Report Status 12/18/2014 FINAL  Final   Assessment: 44 y.o male with with a history of ESRD on HD MWF,to transition from vancomycin + cefepime to oral levaquin for treatment of HCAP.  Last WBC WNL, AF.   Vanc 3/13>>3/17 Cefepime 3/13>>3/17 levaquin 3/17>>  3/13 Cdiff - NEG 3/12 Blood - NGTD  Plan: levaquin 750 mg po x 1 dose followed by 500 mg PO q48 hours -pharmacy will sign off Thanks  Eudelia Bunch, Pharm.D. [id number] 12/18/2014 11:05 AM

## 2014-12-19 LAB — RENAL FUNCTION PANEL
Albumin: 2.5 g/dL — ABNORMAL LOW (ref 3.5–5.2)
Anion gap: 12 (ref 5–15)
BUN: 39 mg/dL — ABNORMAL HIGH (ref 6–23)
CALCIUM: 6.8 mg/dL — AB (ref 8.4–10.5)
CO2: 28 mmol/L (ref 19–32)
CREATININE: 13.52 mg/dL — AB (ref 0.50–1.35)
Chloride: 96 mmol/L (ref 96–112)
GFR calc Af Amer: 5 mL/min — ABNORMAL LOW (ref >90)
GFR calc non Af Amer: 4 mL/min — ABNORMAL LOW (ref >90)
Glucose, Bld: 83 mg/dL (ref 70–99)
PHOSPHORUS: 4.7 mg/dL — AB (ref 2.3–4.6)
POTASSIUM: 4.5 mmol/L (ref 3.5–5.1)
SODIUM: 136 mmol/L (ref 135–145)

## 2014-12-19 LAB — CBC
HCT: 29.9 % — ABNORMAL LOW (ref 39.0–52.0)
HEMOGLOBIN: 9.6 g/dL — AB (ref 13.0–17.0)
MCH: 31.1 pg (ref 26.0–34.0)
MCHC: 32.1 g/dL (ref 30.0–36.0)
MCV: 96.8 fL (ref 78.0–100.0)
PLATELETS: 303 10*3/uL (ref 150–400)
RBC: 3.09 MIL/uL — AB (ref 4.22–5.81)
RDW: 13.5 % (ref 11.5–15.5)
WBC: 4.6 10*3/uL (ref 4.0–10.5)

## 2014-12-19 MED ORDER — ACETAMINOPHEN 325 MG PO TABS
ORAL_TABLET | ORAL | Status: AC
  Filled 2014-12-19: qty 2

## 2014-12-19 NOTE — Progress Notes (Signed)
Patient ID: [name], male   DOB: 1971/01/12, 44 y.o.   MRN: [id number]  [facility] PROGRESS NOTE  [name] [id number] DOB: [birth year] DOA: 12/13/2014 PCP: No PCP Per Patient   Brief narrative:    44 year old African-American male with history of end-stage renal disease, hypertension, hepatitis B, history of parathyroidectomy presented with two-week history of worsening dyspnea, cough, fever, and intermittent diarrhea. He was seen by his nephrologist a few days prior to this admission and started on Z-Pak with no major response. Further evaluation in the emergency room showed pneumonia and was subsequently admitted for further evaluation and treatment.  Assessment/Plan:    Principal Problem:   HCAP (healthcare-associated pneumonia) - pt is clinically improving, reports feeling better - started on Vanc and Cefepime initially, will transition to oral Levaquin 3/17 - continue to provide supportive care with BD's as needed  - possible d/c home in AM Active Problems:   Sepsis - secondary to the above - resolved and pt is now clinically stable    Hyperparathyroidism, secondary - stable    Hyperkalemia - resolved    ESRD on dialysis - nephrology team following, appreciate assistance    Hypotension - BP stable in the past 48 hours  - continue to hold Norvasc and Clonidine that pt takes at home - possible to resume the meds on d/c if BP remains stable    Diarrhea - C. Diff negative - resolved    Hypocalcemia - continue supplementation    Anemia of chronic disease, ESRD - no signs of active bleeding   DVT prophylaxis: Heparin SQ  Code Status: Full.  Family Communication:  plan of care discussed with the patient Disposition Plan: Home in AM  IV access:  Peripheral IV  Procedures and diagnostic studies:    CXR  12/13/2014   Bibasilar pneumonia noted, more prominent on the left.   CXR  12/08/2014   No active cardiopulmonary disease.   CXR   12/05/2014   No acute cardiopulmonary process seen.     Medical Consultants:  Nephrology   Other Consultants:  PT  IAnti-Infectives:   Vancomycin 3/14 --> 3/17 Cefepime 3/14 --> 3/17 Levaquin 3/17 --> 3/21  Faye Ramsay, MD  [facility] Pager [phone]  If 7PM-7AM, please contact night-coverage [email or web address] Password [email or web address] 12/19/2014, 2:12 PM   LOS: 5 days   HPI/Subjective: No events overnight.   Objective: Filed Vitals:   12/19/14 1030 12/19/14 1056 12/19/14 1143 12/19/14 1329  BP: 128/90 120/74 142/95 123/88  Pulse: 85 84 92 94  Temp:  97 F (36.1 C) 98.4 F (36.9 C) 98.2 F (36.8 C)  TempSrc:  Oral Oral Oral  Resp: 18 16 18 20   Height:      Weight:  67.8 kg (149 lb 7.6 oz)    SpO2:  100% 100% 100%    Intake/Output Summary (Last 24 hours) at 12/19/14 1412 Last data filed at 12/19/14 1056  Gross per 24 hour  Intake    240 ml  Output   2000 ml  Net  -1760 ml    Exam:   General:  Pt is alert, follows commands appropriately, not in acute distress  Cardiovascular: Regular rate and rhythm, S1/S2, no murmurs, no rubs, no gallops  Respiratory: Clear to auscultation bilaterally, no rhonchi   Abdomen: Soft, non tender, non distended, bowel sounds present, no guarding  Extremities: pulses DP and PT palpable bilaterally   Data Reviewed: Basic Metabolic Panel:  Recent Labs Lab 12/13/14 1718 12/14/14 0540 12/15/14 [id number]  12/17/14 1143 12/19/14 0405  NA 140 140 141 139 136  K 4.9 5.7* 3.9 4.7 4.5  CL 94* 95* 98 97 96  CO2 21 23 26 26 28   GLUCOSE 90 93 87 69* 83  BUN 77* 85* 26* 55* 39*  CREATININE 24.94* 24.99* 11.27* 19.22* 13.52*  CALCIUM 6.5* 6.2* 7.9* 6.4* 6.8*  PHOS  --   --  2.8 6.3* 4.7*   Liver Function Tests:  Recent Labs Lab 12/13/14 1718 12/15/14 0608 12/17/14 1143 12/19/14 0405  AST 35  --   --   --   ALT 22  --   --   --   ALKPHOS 52  --   --   --   BILITOT 1.0  --   --   --   PROT 8.0  --   --   --   ALBUMIN 3.2* 2.7* 2.5*  2.5*    Recent Labs Lab 12/13/14 1718  LIPASE 43   CBC:  Recent Labs Lab 12/13/14 1718 12/14/14 0540 12/15/14 0608 12/17/14 1143 12/19/14 0405  WBC 8.3 9.2 8.6 4.9 4.6  NEUTROABS 6.6  --   --   --   --   HGB 10.5* 10.3* 10.5* 10.1* 9.6*  HCT 32.8* 33.2* 32.9* 31.6* 29.9*  MCV 95.9 96.8 95.6 96.0 96.8  PLT 141* 161 232 329 303   Recent Results (from the past 240 hour(s))  Blood culture (routine x 2)     Status: None (Preliminary result)   Collection Time: 12/13/14 12:05 AM  Result Value Ref Range Status   Specimen Description BLOOD RIGHT HAND  Final   Special Requests BOTTLES DRAWN AEROBIC AND ANAEROBIC 5CC  Final   Culture   Final           BLOOD CULTURE RECEIVED NO GROWTH TO DATE CULTURE WILL BE HELD FOR 5 DAYS BEFORE ISSUING A FINAL NEGATIVE REPORT Performed at [facility]    Report Status PENDING  Incomplete  Clostridium Difficile by PCR     Status: None   Collection Time: 12/14/14  4:34 AM  Result Value Ref Range Status   C difficile by pcr NEGATIVE NEGATIVE Final  Stool culture     Status: None   Collection Time: 12/14/14  4:34 AM  Result Value Ref Range Status   Specimen Description STOOL  Final   Special Requests NONE  Final   Culture   Final    NO SALMONELLA, SHIGELLA, CAMPYLOBACTER, YERSINIA, OR E.COLI 0157:H7 ISOLATED Note: REDUCED NORMAL FLORA PRESENT Performed at [facility]    Report Status 12/18/2014 FINAL  Final     Scheduled Meds: . calcium carbonate  2 tablet Oral BID BM  . ceFEPime IV  2 g Intravenous Q M,W,F-1800  . heparin  5,000 Units Subcutaneous 3 times per day  . vancomycin  750 mg Intravenous Q M,W,F-HD   Continuous Infusions:

## 2014-12-19 NOTE — Progress Notes (Signed)
Patient was seen on dialysis and the procedure was supervised.   BFR 400 Via left femoral loop AVG  BP is 120/49.  AP -170  VP 190 UF 2.5 liters Bath 2k bath  Patient appears to be tolerating treatment well. No changes to regimen. C/o left sided migraines, no aura or weakness.  Otelia Santee, MD 12/19/2014, 10:47 AM

## 2014-12-20 LAB — CULTURE, BLOOD (ROUTINE X 2): Culture: NO GROWTH

## 2014-12-20 MED ORDER — OXYCODONE HCL 5 MG PO TABS: 5.0000 mg | ORAL_TABLET | ORAL | Status: DC | PRN

## 2014-12-20 MED ORDER — LEVOFLOXACIN 500 MG PO TABS: 500.0000 mg | ORAL_TABLET | ORAL | Status: DC

## 2014-12-20 NOTE — Progress Notes (Signed)
  [facility] Progress Note    Assessment/ Plan:   1. Pneumonia - bilateral per CXR 3/13, BCs 3/7 negative,prior Vancomycin & Cefepime. Now changed to po antibio. 2. Diarrhea - C diff negative. 3. ESRD - HD on MWF @ [facility], recent missed Txs X 2 last week ( mon./ Fri)sec to illness, K 3.7.Last  HD yesterday --> already back on the outpt MWF regimen. HD  @ [facility] Monday. No acute indication today.  4. Hypertension/volume - BP 126/84, no bp meds in hosp/, was on Amlodipine 10 mg qhs at home; wt 65kg yest below EDW. 5. Anemia - Hgb 10.5, no meds. Follow CBC. 6. 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 Nutrition - Alb 2.5, renal diet, vitamin. Add breeze supplement Starting to eat better now with PNA better 8. Hepatitis B - isolation with HD.  Subjective:   Feels better.  Denies diarrhea. Still mild dizziness. No further migraines.    Objective:   BP 131/86 mmHg  Pulse 61  Temp(Src) 98.3 F (36.8 C) (Oral)  Resp 16  Ht 5\' 11"  (1.803 m)  Wt 67.8 kg (149 lb 7.6 oz)  BMI 20.86 kg/m2  SpO2 100%  Intake/Output Summary (Last 24 hours) at 12/20/14 [id number] Last data filed at 12/20/14 [id number]  Gross per 24 hour  Intake    520 ml  Output   2000 ml  Net  -1480 ml   Weight change: -3.1 kg (-6 lb 13.4 oz)  Physical Exam: General : alert,nad LUNGS: CTA bilat now Cardio: RRR, no rub, mur, or gallop GI: soft, non-tender; BS pos nl, NT, ND Extremities: no pedal edema Dialysis Access: L FEm AVG + bruit   Imaging: No results found.  Labs: BMET  Recent Labs Lab 12/13/14 1718 12/14/14 0540 12/15/14 0608 12/17/14 1143 12/19/14 0405  NA 140 140 141 139 136  K 4.9 5.7* 3.9 4.7 4.5  CL 94* 95* 98 97 96  CO2 21 23 26 26 28   GLUCOSE 90 93 87 69* 83  BUN 77* 85* 26* 55* 39*  CREATININE  24.94* 24.99* 11.27* 19.22* 13.52*  CALCIUM 6.5* 6.2* 7.9* 6.4* 6.8*  PHOS  --   --  2.8 6.3* 4.7*   CBC  Recent Labs Lab 12/13/14 1718 12/14/14 0540 12/15/14 0608 12/17/14 1143 12/19/14 0405  WBC 8.3 9.2 8.6 4.9 4.6  NEUTROABS 6.6  --   --   --   --   HGB 10.5* 10.3* 10.5* 10.1* 9.6*  HCT 32.8* 33.2* 32.9* 31.6* 29.9*  MCV 95.9 96.8 95.6 96.0 96.8  PLT 141* 161 232 329 303    Medications:    . calcium carbonate  2 tablet Oral TID WC  . calcium carbonate  3 tablet Oral BID BM  . doxercalciferol  2 mcg Intravenous Q M,W,F-HD  . heparin  5,000 Units Subcutaneous 3 times per day  . levofloxacin  500 mg Oral Q48H  . sodium chloride  3 mL Intravenous Q12H      Otelia Santee, MD 12/20/2014, 9:24 AM

## 2014-12-20 NOTE — Discharge Instructions (Signed)

## 2014-12-20 NOTE — Discharge Summary (Signed)
Physician Discharge Summary  [name] [facility] [id number] DOB: November 29, 1970 DOA: 12/13/2014  PCP: No PCP Per Patient  Admit date: 12/13/2014 Discharge date: 12/20/2014  Recommendations for Outpatient Follow-up:  1. Pt will need to follow up with PCP in 2-3 weeks post discharge 2. Please obtain BMP to evaluate electrolytes and kidney function 3. Please also check CBC to evaluate Hg and Hct levels 4. Levaquin for 5 more days every other day   Discharge Diagnoses:  Principal Problem:   HCAP (healthcare-associated pneumonia) Active Problems:   Hyperparathyroidism, secondary   ESRD on dialysis   Sepsis   Hypotension   Diarrhea   Hypocalcemia    Discharge Condition: Stable  Diet recommendation: Heart healthy diet discussed in details     Brief narrative:    44 year old African-American male with history of end-stage renal disease, hypertension, hepatitis B, history of parathyroidectomy presented with two-week history of worsening dyspnea, cough, fever, and intermittent diarrhea. He was seen by his nephrologist a few days prior to this admission and started on Z-Pak with no major response. Further evaluation in the emergency room showed pneumonia and was subsequently admitted for further evaluation and treatment.  Assessment/Plan:    Principal Problem:  HCAP (healthcare-associated pneumonia) - pt is clinically improving, reports feeling better - started on Vanc and Cefepime initially, transitioned to oral Levaquin 3/17 - continue Levaquin for 5 more days post discharge  Active Problems:  Sepsis - secondary to the above - resolved and pt is now clinically stable   Hyperparathyroidism, secondary - stable   Hyperkalemia - resolved   ESRD on dialysis - nephrology team following, appreciate assistance  - tolerating HD well   Hypotension - BP stable in the past 48 hours   Diarrhea - C. Diff negative - resolved   Hypocalcemia - continue  supplementation   Anemia of chronic disease, ESRD - no signs of active bleeding    Code Status: Full.  Family Communication: plan of care discussed with the patient Disposition Plan: Home   IV access:  Peripheral IV  Procedures and diagnostic studies:   CXR 12/13/2014 Bibasilar pneumonia noted, more prominent on the left.  CXR 12/08/2014 No active cardiopulmonary disease.  CXR 12/05/2014 No acute cardiopulmonary process seen.   Medical Consultants:  Nephrology   Other Consultants:  PT  IAnti-Infectives:   Vancomycin 3/14 --> 3/17 Cefepime 3/14 --> 3/17 Levaquin 3/17 --> 5 more days post discharge        Discharge Exam: Filed Vitals:   12/20/14 0639  BP: 131/86  Pulse: 61  Temp: 98.3 F (36.8 C)  Resp: 16   Filed Vitals:   12/19/14 1329 12/19/14 2252 12/20/14 0207 12/20/14 0639  BP: 123/88 130/79 128/76 131/86  Pulse: 94 67 74 61  Temp: 98.2 F (36.8 C) 98.3 F (36.8 C) 98.2 F (36.8 C) 98.3 F (36.8 C)  TempSrc: Oral Oral Oral Oral  Resp: 20 17 17 16   Height:      Weight:      SpO2: 100% 100% 100% 100%    General: Pt is alert, follows commands appropriately, not in acute distress Cardiovascular: Regular rate and rhythm, S1/S2 +, no murmurs, no rubs, no gallops Respiratory: Clear to auscultation bilaterally, no wheezing, no rhonchi  Abdominal: Soft, non tender, non distended, bowel sounds +, no guarding Extremities: no cyanosis, pulses palpable bilaterally DP and PT Neuro: Grossly nonfocal  Discharge Instructions  Discharge Instructions    Diet - low sodium heart healthy    Complete by:  As  directed      Increase activity slowly    Complete by:  As directed             Medication List    STOP taking these medications        amoxicillin-clavulanate 875-125 MG per tablet  Commonly known as:  AUGMENTIN     azithromycin 250 MG tablet  Commonly known as:  ZITHROMAX     fluticasone 50 MCG/ACT nasal spray   Commonly known as:  FLONASE      TAKE these medications        acetaminophen 500 MG tablet  Commonly known as:  TYLENOL  Take 1,000 mg by mouth every 6 (six) hours as needed for pain. For pain     amLODipine 10 MG tablet  Commonly known as:  NORVASC  Take 10 mg by mouth daily.     calcium carbonate 500 MG chewable tablet  Commonly known as:  TUMS - dosed in mg elemental calcium  Chew 2 tablets (400 mg of elemental calcium total) by mouth 2 (two) times daily between meals.     cloNIDine 0.2 MG tablet  Commonly known as:  CATAPRES  Take 0.2 mg by mouth daily.     levofloxacin 500 MG tablet  Commonly known as:  LEVAQUIN  Take 1 tablet (500 mg total) by mouth every other day.     oxyCODONE 5 MG immediate release tablet  Commonly known as:  Oxy IR/ROXICODONE  Take 1 tablet (5 mg total) by mouth every 4 (four) hours as needed for moderate pain.           Follow-up Information    Follow up with Faye Ramsay, MD.   Specialty:  Internal Medicine   Why:  As needed call my cell phone [phone]   Contact information:   [address] [place] [address] [phone]        The results of significant diagnostics from this hospitalization (including imaging, microbiology, ancillary and laboratory) are listed below for reference.     Microbiology: Recent Results (from the past 240 hour(s))  Blood culture (routine x 2)     Status: None   Collection Time: 12/13/14 12:05 AM  Result Value Ref Range Status   Specimen Description BLOOD RIGHT HAND  Final   Special Requests BOTTLES DRAWN AEROBIC AND ANAEROBIC 5CC  Final   Culture   Final    NO GROWTH 5 DAYS Performed at [facility]    Report Status 12/20/2014 FINAL  Final  Clostridium Difficile by PCR     Status: None   Collection Time: 12/14/14  4:34 AM  Result Value Ref Range Status   C difficile by pcr NEGATIVE NEGATIVE Final  Stool culture     Status: None   Collection Time:  12/14/14  4:34 AM  Result Value Ref Range Status   Specimen Description STOOL  Final   Special Requests NONE  Final   Culture   Final    NO SALMONELLA, SHIGELLA, CAMPYLOBACTER, YERSINIA, OR E.COLI 0157:H7 ISOLATED Note: REDUCED NORMAL FLORA PRESENT Performed at [facility]    Report Status 12/18/2014 FINAL  Final     Labs: Basic Metabolic Panel:  Recent Labs Lab 12/13/14 1718 12/14/14 0540 12/15/14 0608 12/17/14 1143 12/19/14 0405  NA 140 140 141 139 136  K 4.9 5.7* 3.9 4.7 4.5  CL 94* 95* 98 97 96  CO2 21 23 26 26 28   GLUCOSE 90 93 87 69*  83  BUN 77* 85* 26* 55* 39*  CREATININE 24.94* 24.99* 11.27* 19.22* 13.52*  CALCIUM 6.5* 6.2* 7.9* 6.4* 6.8*  PHOS  --   --  2.8 6.3* 4.7*   Liver Function Tests:  Recent Labs Lab 12/13/14 1718 12/15/14 0608 12/17/14 1143 12/19/14 0405  AST 35  --   --   --   ALT 22  --   --   --   ALKPHOS 52  --   --   --   BILITOT 1.0  --   --   --   PROT 8.0  --   --   --   ALBUMIN 3.2* 2.7* 2.5* 2.5*    Recent Labs Lab 12/13/14 1718  LIPASE 43   CBC:  Recent Labs Lab 12/13/14 1718 12/14/14 0540 12/15/14 0608 12/17/14 1143 12/19/14 0405  WBC 8.3 9.2 8.6 4.9 4.6  NEUTROABS 6.6  --   --   --   --   HGB 10.5* 10.3* 10.5* 10.1* 9.6*  HCT 32.8* 33.2* 32.9* 31.6* 29.9*  MCV 95.9 96.8 95.6 96.0 96.8  PLT 141* 161 232 329 303    SIGNED: Time coordinating discharge: Over 30 minutes  Faye Ramsay, MD  [facility] 12/20/2014, 7:58 AM Pager [phone]  If 7PM-7AM, please contact night-coverage [email or web address] Password [email or web address]

## 2015-03-02 ENCOUNTER — Emergency Department (HOSPITAL_COMMUNITY)
Admission: EM | Admit: 2015-03-02 | Discharge: 2015-03-02 | Disposition: A | Discharge status: Home / Self Care | Payer: Medicare Other | Attending: Emergency Medicine | Admitting: Emergency Medicine

## 2015-03-02 ENCOUNTER — Encounter (HOSPITAL_COMMUNITY): Payer: Self-pay | Admitting: *Deleted

## 2015-03-02 DIAGNOSIS — Z8639 Personal history of other endocrine, nutritional and metabolic disease: Secondary | ICD-10-CM | POA: Insufficient documentation

## 2015-03-02 DIAGNOSIS — Y841 Kidney dialysis as the cause of abnormal reaction of the patient, or of later complication, without mention of misadventure at the time of the procedure: Secondary | ICD-10-CM | POA: Insufficient documentation

## 2015-03-02 DIAGNOSIS — T82868A Thrombosis of vascular prosthetic devices, implants and grafts, initial encounter: Secondary | ICD-10-CM | POA: Insufficient documentation

## 2015-03-02 DIAGNOSIS — Z8659 Personal history of other mental and behavioral disorders: Secondary | ICD-10-CM | POA: Insufficient documentation

## 2015-03-02 DIAGNOSIS — Z992 Dependence on renal dialysis: Secondary | ICD-10-CM | POA: Insufficient documentation

## 2015-03-02 DIAGNOSIS — I120 Hypertensive chronic kidney disease with stage 5 chronic kidney disease or end stage renal disease: Secondary | ICD-10-CM | POA: Insufficient documentation

## 2015-03-02 DIAGNOSIS — Z862 Personal history of diseases of the blood and blood-forming organs and certain disorders involving the immune mechanism: Secondary | ICD-10-CM | POA: Insufficient documentation

## 2015-03-02 DIAGNOSIS — Z79899 Other long term (current) drug therapy: Secondary | ICD-10-CM | POA: Insufficient documentation

## 2015-03-02 DIAGNOSIS — T82590A Other mechanical complication of surgically created arteriovenous fistula, initial encounter: Secondary | ICD-10-CM

## 2015-03-02 DIAGNOSIS — N186 End stage renal disease: Secondary | ICD-10-CM | POA: Insufficient documentation

## 2015-03-02 DIAGNOSIS — Z8739 Personal history of other diseases of the musculoskeletal system and connective tissue: Secondary | ICD-10-CM | POA: Insufficient documentation

## 2015-03-02 DIAGNOSIS — Z87891 Personal history of nicotine dependence: Secondary | ICD-10-CM | POA: Insufficient documentation

## 2015-03-02 LAB — CBC WITH DIFFERENTIAL/PLATELET
BASOS PCT: 1 % (ref 0–1)
Basophils Absolute: 0 10*3/uL (ref 0.0–0.1)
Eosinophils Absolute: 0.2 10*3/uL (ref 0.0–0.7)
Eosinophils Relative: 4 % (ref 0–5)
HCT: 41.7 % (ref 39.0–52.0)
HEMOGLOBIN: 13.9 g/dL (ref 13.0–17.0)
LYMPHS ABS: 1.5 10*3/uL (ref 0.7–4.0)
Lymphocytes Relative: 27 % (ref 12–46)
MCH: 31.9 pg (ref 26.0–34.0)
MCHC: 33.3 g/dL (ref 30.0–36.0)
MCV: 95.6 fL (ref 78.0–100.0)
Monocytes Absolute: 0.4 10*3/uL (ref 0.1–1.0)
Monocytes Relative: 8 % (ref 3–12)
NEUTROS ABS: 3.4 10*3/uL (ref 1.7–7.7)
NEUTROS PCT: 60 % (ref 43–77)
Platelets: 183 10*3/uL (ref 150–400)
RBC: 4.36 MIL/uL (ref 4.22–5.81)
RDW: 13.9 % (ref 11.5–15.5)
WBC: 5.6 10*3/uL (ref 4.0–10.5)

## 2015-03-02 LAB — BASIC METABOLIC PANEL
ANION GAP: 22 — AB (ref 5–15)
BUN: 92 mg/dL — ABNORMAL HIGH (ref 6–20)
CALCIUM: 7.6 mg/dL — AB (ref 8.9–10.3)
CHLORIDE: 91 mmol/L — AB (ref 101–111)
CO2: 23 mmol/L (ref 22–32)
Creatinine, Ser: 20.9 mg/dL — ABNORMAL HIGH (ref 0.61–1.24)
GFR, EST AFRICAN AMERICAN: 3 mL/min — AB (ref >60)
GFR, EST NON AFRICAN AMERICAN: 2 mL/min — AB (ref >60)
Glucose, Bld: 121 mg/dL — ABNORMAL HIGH (ref 65–99)
POTASSIUM: 4.8 mmol/L (ref 3.5–5.1)
Sodium: 136 mmol/L (ref 135–145)

## 2015-03-02 MED ORDER — OXYCODONE-ACETAMINOPHEN 5-325 MG PO TABS
1.0000 | ORAL_TABLET | Freq: Once | ORAL | Status: AC
  Administered 2015-03-02: 1 via ORAL
  Filled 2015-03-02: qty 1

## 2015-03-02 NOTE — ED Notes (Signed)
[name]- Dialysis center sts patient can be seen outpatient at dialysis center tomorrow for de-clotting fistula/graft and will be seen afterwards to complete dialysis.  [name] speaking to pt to update patient and get updated info for dialysis staff

## 2015-03-02 NOTE — Discharge Instructions (Signed)
You will be notified of your appt time tomorrow to have clot removed. You will go from there to have dialysis. Return here for any new concerns.

## 2015-03-02 NOTE — ED Provider Notes (Signed)
CSN: [id number]     Arrival date & time 03/02/15  0855 History   First MD Initiated Contact with Patient 03/02/15 [phone]     Chief Complaint  Patient presents with  . Vascular Access Problem   (Consider location/radiation/quality/duration/timing/severity/associated sxs/prior Treatment) The history is provided by the patient and medical records.    This is a 44 year old male with past medical history significant for hypertension, anemia, thyroid disease, depression, end-stage renal disease on hemodialysis, presenting to the ED for possible clot in his dialysis fistula.  Patient states he felt that his graft in his left thigh may have clotted over the weekend because it was not "pulsating".  He went to dialysis this morning and was sent here as they could not use his fistula.  He states he has not missed any other recent appts.  He denies fever, chills, sweats.  No increased leg pain.  No chest pain or SOB.  VSS.  Past Medical History  Diagnosis Date  . ESRD on hemodialysis     HD  . Hypertension   . Anemia   . Thyroid disease   . Cough     DRY    . Headache(784.0)   . Muscle spasms of neck     BACK, NECK  . Depression    Past Surgical History  Procedure Laterality Date  . Hemodialysis graft to left arm and left leg    . Parathyroidectomy N/A 04/24/2014    Procedure: TOTAL 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;  Surgeon: [name], MD;  Location: [place];  Service: General;  Laterality: N/A;  NECK AND LEFT FOREARM   History reviewed. No pertinent family history. History  Substance Use Topics  . Smoking status: Former Smoker    Quit date: 03/21/1986  . Smokeless tobacco: Never Used  . Alcohol Use: No    Review of Systems  Endocrine:       Fistula problem  All other systems reviewed and are negative.     Allergies  Review of patient's allergies indicates no known allergies.  Home Medications   Prior to Admission medications   Medication Sig Start Date End  Date Taking? Authorizing Provider  acetaminophen (TYLENOL) 500 MG tablet Take 1,000 mg by mouth every 6 (six) hours as needed for pain. For pain   Yes Historical Provider, MD  amLODipine (NORVASC) 10 MG tablet Take 10 mg by mouth daily.   Yes Historical Provider, MD  cloNIDine (CATAPRES) 0.2 MG tablet Take 0.2 mg by mouth daily.    Yes Historical Provider, MD  calcium carbonate (TUMS - DOSED IN MG ELEMENTAL CALCIUM) 500 MG chewable tablet Chew 2 tablets (400 mg of elemental calcium total) by mouth 2 (two) times daily between meals. Patient not taking: Reported on 03/02/2015 04/26/14   Judeth Horn, MD   BP 145/96 mmHg  Pulse 73  Temp(Src) 97.8 F (36.6 C) (Oral)  Resp 16  SpO2 100%   Physical Exam  Constitutional: He is oriented to person, place, and time. He appears well-developed and well-nourished.  HENT:  Head: Normocephalic and atraumatic.  Mouth/Throat: Oropharynx is clear and moist.  Eyes: Conjunctivae and EOM are normal. Pupils are equal, round, and reactive to light.  Neck: Normal range of motion.  Cardiovascular: Normal rate, regular rhythm and normal heart sounds.   Pulmonary/Chest: Effort normal and breath sounds normal.  Abdominal: Soft. Bowel sounds are normal.  Musculoskeletal: Normal range of motion.  AV fistula left thigh, no thrill present Site clean, no signs of  infection/abscess formation  Neurological: He is alert and oriented to person, place, and time.  Skin: Skin is warm and dry.  Psychiatric: He has a normal mood and affect.  Nursing note and vitals reviewed.   ED Course  Procedures (including critical care time) Labs Review Labs Reviewed  BASIC METABOLIC PANEL - Abnormal; Notable for the following:    Chloride 91 (*)    Glucose, Bld 121 (*)    BUN 92 (*)    Creatinine, Ser 20.90 (*)    Calcium 7.6 (*)    GFR calc non Af Amer 2 (*)    GFR calc Af Amer 3 (*)    Anion gap 22 (*)    All other components within normal limits  CBC WITH  DIFFERENTIAL/PLATELET    Imaging Review No results found.   EKG Interpretation None      MDM   Final diagnoses:  Dialysis AV fistula malfunction, initial encounter   44 year old male with clotted dialysis graft of his left thigh.  On exam, graft in LLE appears clean without signs of infection.  There is no palpable thrill. Porfirio Mylar with nephrology has met with patient-- he will be seen tomorrow at outpatient facility to have clot removed and will be transferred to dialysis center afterwards for dialysis treatment.  Lab work here is stable-- K+ 4.8.  Patient has no chest pain or SOB and VS are stable on RA.  Patient will be d/c home, instructed to make sure he keeps both appts for tomorrow.  Discussed plan with patient, he/she acknowledged understanding and agreed with plan of care.  Return precautions given for new or worsening symptoms.  Larene Pickett, PA-C 03/02/15 [place], MD 03/02/15 [phone]

## 2015-03-02 NOTE — ED Notes (Signed)
Pt in stating he was sent over to have his dialysis graft evaluated, dialysis center concerned there is a clot in it and it would not work today, pt is due for dialysis today, reports swelling to his right upper leg and site

## 2015-03-03 ENCOUNTER — Other Ambulatory Visit (HOSPITAL_COMMUNITY): Payer: Self-pay | Admitting: Nephrology

## 2015-03-03 ENCOUNTER — Encounter (HOSPITAL_COMMUNITY): Payer: Self-pay

## 2015-03-03 ENCOUNTER — Ambulatory Visit (HOSPITAL_COMMUNITY)
Admission: RE | Admit: 2015-03-03 | Discharge: 2015-03-03 | Disposition: A | Discharge status: Home / Self Care | Payer: Medicare Other | Source: Ambulatory Visit | Attending: Nephrology | Admitting: Nephrology

## 2015-03-03 DIAGNOSIS — R51 Headache: Secondary | ICD-10-CM | POA: Insufficient documentation

## 2015-03-03 DIAGNOSIS — Z992 Dependence on renal dialysis: Secondary | ICD-10-CM | POA: Insufficient documentation

## 2015-03-03 DIAGNOSIS — E079 Disorder of thyroid, unspecified: Secondary | ICD-10-CM | POA: Insufficient documentation

## 2015-03-03 DIAGNOSIS — D649 Anemia, unspecified: Secondary | ICD-10-CM | POA: Insufficient documentation

## 2015-03-03 DIAGNOSIS — R05 Cough: Secondary | ICD-10-CM | POA: Insufficient documentation

## 2015-03-03 DIAGNOSIS — T82858A Stenosis of vascular prosthetic devices, implants and grafts, initial encounter: Secondary | ICD-10-CM | POA: Insufficient documentation

## 2015-03-03 DIAGNOSIS — N186 End stage renal disease: Secondary | ICD-10-CM

## 2015-03-03 DIAGNOSIS — T82868A Thrombosis of vascular prosthetic devices, implants and grafts, initial encounter: Secondary | ICD-10-CM | POA: Insufficient documentation

## 2015-03-03 DIAGNOSIS — Z87891 Personal history of nicotine dependence: Secondary | ICD-10-CM | POA: Insufficient documentation

## 2015-03-03 DIAGNOSIS — F329 Major depressive disorder, single episode, unspecified: Secondary | ICD-10-CM | POA: Insufficient documentation

## 2015-03-03 DIAGNOSIS — I120 Hypertensive chronic kidney disease with stage 5 chronic kidney disease or end stage renal disease: Secondary | ICD-10-CM | POA: Insufficient documentation

## 2015-03-03 DIAGNOSIS — M62830 Muscle spasm of back: Secondary | ICD-10-CM | POA: Insufficient documentation

## 2015-03-03 DIAGNOSIS — Y832 Surgical operation with anastomosis, bypass or graft as the cause of abnormal reaction of the patient, or of later complication, without mention of misadventure at the time of the procedure: Secondary | ICD-10-CM | POA: Insufficient documentation

## 2015-03-03 MED ORDER — MIDAZOLAM HCL 2 MG/2ML IJ SOLN
INTRAMUSCULAR | Status: AC
  Filled 2015-03-03: qty 2

## 2015-03-03 MED ORDER — ALTEPLASE 100 MG IV SOLR
2.0000 mg | INTRAVENOUS | Status: AC
  Administered 2015-03-03: 2 mg
  Filled 2015-03-03: qty 2

## 2015-03-03 MED ORDER — HEPARIN SOD (PORK) LOCK FLUSH 100 UNIT/ML IV SOLN
INTRAVENOUS | Status: AC | PRN
  Administered 2015-03-03: 3000 [IU] via INTRAVENOUS

## 2015-03-03 MED ORDER — LIDOCAINE HCL 1 % IJ SOLN
INTRAMUSCULAR | Status: DC
Start: 2015-03-03 — End: 2015-03-04
  Filled 2015-03-03: qty 20

## 2015-03-03 MED ORDER — FENTANYL CITRATE (PF) 100 MCG/2ML IJ SOLN
INTRAMUSCULAR | Status: AC | PRN
  Administered 2015-03-03: 25 ug via INTRAVENOUS
  Administered 2015-03-03: 50 ug via INTRAVENOUS
  Administered 2015-03-03: 25 ug via INTRAVENOUS
  Administered 2015-03-03: 50 ug via INTRAVENOUS

## 2015-03-03 MED ORDER — HEPARIN SODIUM (PORCINE) 1000 UNIT/ML IJ SOLN
INTRAMUSCULAR | Status: AC
  Filled 2015-03-03: qty 1

## 2015-03-03 MED ORDER — MIDAZOLAM HCL 2 MG/2ML IJ SOLN
INTRAMUSCULAR | Status: AC | PRN
  Administered 2015-03-03 (×2): 1 mg via INTRAVENOUS

## 2015-03-03 MED ORDER — IOHEXOL 300 MG/ML  SOLN
100.0000 mL | Freq: Once | INTRAMUSCULAR | Status: AC | PRN
  Administered 2015-03-03: 50 mL via INTRAVENOUS

## 2015-03-03 MED ORDER — FENTANYL CITRATE (PF) 100 MCG/2ML IJ SOLN
INTRAMUSCULAR | Status: AC
  Filled 2015-03-03: qty 2

## 2015-03-03 NOTE — Procedures (Signed)
Successful declot of left thigh AV graft. No immediate complication and minimal blood loss.  See full report in PACS.

## 2015-03-03 NOTE — Sedation Documentation (Signed)
Patient denies pain and is resting comfortably.  

## 2015-03-03 NOTE — Sedation Documentation (Signed)
Pt's 30 min recovery time is final. No discomfort complained of. Vital are WDL. Pt is dressed and waiting on his ride.

## 2015-03-03 NOTE — H&P (Signed)
Chief Complaint: Left thigh dialysis graft clotted  Referring Physician(s): Dunham,Cynthia  History of Present Illness: [name] is a 44 y.o. male   Lt thigh dialysis graft clotted Awoke Sunday am without thrill or pulse Last use Fr 5/27--no issue Last intervention 01/2013- same graft Graft is 49-5 yrs old Now scheduled for thrombolysis/thrombectomy with possible angioplasty/stent Possible dialysis catheter placement   Past Medical History  Diagnosis Date  . ESRD on hemodialysis     HD  . Hypertension   . Anemia   . Thyroid disease   . Cough     DRY    . Headache(784.0)   . Muscle spasms of neck     BACK, NECK  . Depression     Past Surgical History  Procedure Laterality Date  . Hemodialysis graft to left arm and left leg    . Parathyroidectomy N/A 04/24/2014    Procedure: TOTAL 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;  Surgeon: [name], MD;  Location: [place];  Service: General;  Laterality: N/A;  NECK AND LEFT FOREARM    Allergies: Lisinopril  Medications: Prior to Admission medications   Medication Sig Start Date End Date Taking? Authorizing Provider  acetaminophen (TYLENOL) 500 MG tablet Take 1,000 mg by mouth every 6 (six) hours as needed for pain. For pain   Yes Historical Provider, MD  amLODipine (NORVASC) 10 MG tablet Take 10 mg by mouth daily.   Yes Historical Provider, MD  calcium carbonate (TUMS - DOSED IN MG ELEMENTAL CALCIUM) 500 MG chewable tablet Chew 2 tablets (400 mg of elemental calcium total) by mouth 2 (two) times daily between meals. 04/26/14  Yes Judeth Horn, MD  cloNIDine (CATAPRES) 0.2 MG tablet Take 0.2 mg by mouth daily.     Historical Provider, MD     History reviewed. No pertinent family history.  History   Social History  . Marital Status: Single    Spouse Name: N/A  . Number of Children: N/A  . Years of Education: N/A   Social History Main Topics  . Smoking status: Former Smoker    Quit date:  03/21/1986  . Smokeless tobacco: Never Used  . Alcohol Use: No  . Drug Use: No  . Sexual Activity: Not on file   Other Topics Concern  . None   Social History Narrative    Review of Systems: A 12 point ROS discussed and pertinent positives are indicated in the HPI above.  All other systems are negative.  Review of Systems  Constitutional: Negative for fever, diaphoresis, activity change, appetite change and fatigue.  Respiratory: Negative for cough and shortness of breath.   Cardiovascular: Negative for chest pain.  Psychiatric/Behavioral: Negative for behavioral problems and confusion.     Vital Signs: BP 127/86 mmHg  Pulse 72  Temp(Src) 97.7 F (36.5 C) (Oral)  Resp 16  SpO2 98%  Physical Exam  Constitutional: He is oriented to person, place, and time.  Cardiovascular: Normal rate and regular rhythm.   Pulmonary/Chest: Effort normal and breath sounds normal.  Abdominal: Soft.  Musculoskeletal: Normal range of motion.  Left thigh graft without trill or pulse  Neurological: He is alert and oriented to person, place, and time.  Skin: Skin is warm and dry.  Psychiatric: He has a normal mood and affect. His behavior is normal. Judgment and thought content normal.  Nursing note and vitals reviewed.   Mallampati Score:  MD Evaluation Airway: WNL Heart: WNL Abdomen: WNL Chest/ Lungs: WNL ASA  Classification: 3 Mallampati/Airway Score: One  Imaging: No results found.  Labs:  CBC:  Recent Labs  12/15/14 0608 12/17/14 1143 12/19/14 0405 03/02/15 0931  WBC 8.6 4.9 4.6 5.6  HGB 10.5* 10.1* 9.6* 13.9  HCT 32.9* 31.6* 29.9* 41.7  PLT 232 329 303 183    COAGS: No results for input(s): INR, APTT in the last 8760 hours.  BMP:  Recent Labs  12/15/14 0608 12/17/14 1143 12/19/14 0405 03/02/15 0931  NA 141 139 136 136  K 3.9 4.7 4.5 4.8  CL 98 97 96 91*  CO2 26 26 28 23   GLUCOSE 87 69* 83 121*  BUN 26* 55* 39* 92*  CALCIUM 7.9* 6.4* 6.8* 7.6*    CREATININE 11.27* 19.22* 13.52* 20.90*  GFRNONAA 5* 3* 4* 2*  GFRAA 6* 3* 5* 3*    LIVER FUNCTION TESTS:  Recent Labs  06/29/14 1417 12/05/14 1902 12/08/14 1351 12/13/14 1718 12/15/14 0608 12/17/14 1143 12/19/14 0405  BILITOT 0.2* 0.6 0.7 1.0  --   --   --   AST 10 14 44* 35  --   --   --   ALT 8 10 31 22   --   --   --   ALKPHOS 86 63 56 52  --   --   --   PROT 8.8* 8.0 7.1 8.0  --   --   --   ALBUMIN 4.1 3.5 3.3* 3.2* 2.7* 2.5* 2.5*    TUMOR MARKERS: No results for input(s): AFPTM, CEA, CA199, CHROMGRNA in the last 8760 hours.  Assessment and Plan:  Left thigh dialysis graft clotted since 5/29 am Using well 5/27 Now scheduled for thrombolysis/thrombectomy of same with poss pta/stent if needed. Possible dialysis catheter placement Risks and Benefits discussed with the patient including, but not limited to bleeding, infection, vascular injury, pulmonary embolism, need for tunneled HD catheter placement or even death. All of the patient's questions were answered, patient is agreeable to proceed. Consent signed and in chart.   Thank you for this interesting consult.  I greatly enjoyed meeting Carvin Abdou Thornell and look forward to participating in their care.  Signed: Harutyun Monteverde A 03/03/2015, 1:45 PM   I spent a total of  20 Minutes   in face to face in clinical consultation, greater than 50% of which was counseling/coordinating care for left thigh graft declot

## 2015-03-03 NOTE — Progress Notes (Signed)
Pt's ride has arrived. He remains stable. Site of procedure looks unremarkable. PIV removd and pt escorted to his ride.

## 2015-03-30 ENCOUNTER — Ambulatory Visit (HOSPITAL_COMMUNITY)
Admission: RE | Admit: 2015-03-30 | Discharge: 2015-03-30 | Disposition: A | Discharge status: Home / Self Care | Payer: Medicare Other | Source: Ambulatory Visit | Attending: Nephrology | Admitting: Nephrology

## 2015-03-30 ENCOUNTER — Other Ambulatory Visit (HOSPITAL_COMMUNITY): Payer: Self-pay | Admitting: Nephrology

## 2015-03-30 ENCOUNTER — Encounter (HOSPITAL_COMMUNITY): Payer: Self-pay

## 2015-03-30 DIAGNOSIS — Z992 Dependence on renal dialysis: Principal | ICD-10-CM

## 2015-03-30 DIAGNOSIS — I120 Hypertensive chronic kidney disease with stage 5 chronic kidney disease or end stage renal disease: Secondary | ICD-10-CM | POA: Insufficient documentation

## 2015-03-30 DIAGNOSIS — N186 End stage renal disease: Secondary | ICD-10-CM

## 2015-03-30 DIAGNOSIS — T82858A Stenosis of vascular prosthetic devices, implants and grafts, initial encounter: Secondary | ICD-10-CM | POA: Insufficient documentation

## 2015-03-30 DIAGNOSIS — E079 Disorder of thyroid, unspecified: Secondary | ICD-10-CM | POA: Insufficient documentation

## 2015-03-30 DIAGNOSIS — F329 Major depressive disorder, single episode, unspecified: Secondary | ICD-10-CM | POA: Insufficient documentation

## 2015-03-30 DIAGNOSIS — Y832 Surgical operation with anastomosis, bypass or graft as the cause of abnormal reaction of the patient, or of later complication, without mention of misadventure at the time of the procedure: Secondary | ICD-10-CM | POA: Insufficient documentation

## 2015-03-30 DIAGNOSIS — Z87891 Personal history of nicotine dependence: Secondary | ICD-10-CM | POA: Insufficient documentation

## 2015-03-30 DIAGNOSIS — T82868A Thrombosis of vascular prosthetic devices, implants and grafts, initial encounter: Secondary | ICD-10-CM | POA: Insufficient documentation

## 2015-03-30 DIAGNOSIS — D649 Anemia, unspecified: Secondary | ICD-10-CM | POA: Insufficient documentation

## 2015-03-30 LAB — POCT I-STAT 4, (NA,K, GLUC, HGB,HCT)
Glucose, Bld: 95 mg/dL (ref 65–99)
HEMATOCRIT: 39 % (ref 39.0–52.0)
Hemoglobin: 13.3 g/dL (ref 13.0–17.0)
Potassium: 5.1 mmol/L (ref 3.5–5.1)
SODIUM: 135 mmol/L (ref 135–145)

## 2015-03-30 MED ORDER — ALTEPLASE 2 MG IJ SOLR
4.0000 mg | Freq: Once | INTRAMUSCULAR | Status: AC
  Administered 2015-03-30: 4 mg
  Filled 2015-03-30: qty 4

## 2015-03-30 MED ORDER — FENTANYL CITRATE (PF) 100 MCG/2ML IJ SOLN
INTRAMUSCULAR | Status: DC | PRN
  Administered 2015-03-30 (×3): 50 ug via INTRAVENOUS

## 2015-03-30 MED ORDER — LIDOCAINE HCL 1 % IJ SOLN
INTRAMUSCULAR | Status: AC
  Filled 2015-03-30: qty 20

## 2015-03-30 MED ORDER — MIDAZOLAM HCL 2 MG/2ML IJ SOLN
INTRAMUSCULAR | Status: DC | PRN
  Administered 2015-03-30 (×3): 1 mg via INTRAVENOUS

## 2015-03-30 MED ORDER — IOHEXOL 300 MG/ML  SOLN
150.0000 mL | Freq: Once | INTRAMUSCULAR | Status: AC | PRN
  Administered 2015-03-30: 80 mL via INTRAVENOUS

## 2015-03-30 MED ORDER — MIDAZOLAM HCL 2 MG/2ML IJ SOLN
INTRAMUSCULAR | Status: AC
  Filled 2015-03-30: qty 4

## 2015-03-30 MED ORDER — MIDAZOLAM HCL 2 MG/2ML IJ SOLN
INTRAMUSCULAR | Status: AC
  Filled 2015-03-30: qty 2

## 2015-03-30 MED ORDER — FENTANYL CITRATE (PF) 100 MCG/2ML IJ SOLN
INTRAMUSCULAR | Status: AC
  Filled 2015-03-30: qty 2

## 2015-03-30 MED ORDER — HEPARIN SODIUM (PORCINE) 1000 UNIT/ML IJ SOLN
INTRAMUSCULAR | Status: AC
  Filled 2015-03-30: qty 1

## 2015-03-30 MED ORDER — FENTANYL CITRATE (PF) 100 MCG/2ML IJ SOLN
INTRAMUSCULAR | Status: AC
  Filled 2015-03-30: qty 4

## 2015-03-30 NOTE — Sedation Documentation (Signed)
Patient denies pain and is resting comfortably.  

## 2015-03-30 NOTE — Procedures (Signed)
Interventional Radiology Procedure Note  Procedure: Declot of the left femoral AV graft.  PTA of venous outflow up to 49mm.  Arterial anastamosis was 74mm PTA.  Complications: None Findings: patent graft.  Excellent thrill.   Recommendations:  - Ok to shower tomorrow - OK to dialyze.   - remove stay sutures at next dialysis session. - Routine line care   Signed,  Dulcy Fanny. Earleen Newport, DO

## 2015-03-30 NOTE — Progress Notes (Signed)
Pt here today for Declot procedure. Pt drove himself to hospital. Pt was informed that he must have a "ride" home to be sedated. Pt set up transport- pt was D/C at 5pm- pts "ride' called and said she was "here to get him." RN accompanied him to all entrances; person picking him up was "no where to be found." Pt lives in [place] and cannot drive himself home. [facility] contacted to see about getting a voucher for transport home. Pt is being watched presently by on-call RN and needs to be released so RN can be avaliable for emergency cases.

## 2015-03-30 NOTE — H&P (Signed)
Chief Complaint:  Clotted Left thigh dialysis graft  Referring Physician(s): Dunham,Cynthia  History of Present Illness: [name] is a 44 y.o. male   Last use Fr 6/24---no issues Last intervention 5/31: successful declot Unable to use this am; no thrill no pulse Sent to [facility] from dialysis center Now scheduled for Lt thigh dialysis catheter thrombolysis /thrombectomy with possible angioplasty/stent placement Possible dialysis catheter placement  Past Medical History  Diagnosis Date  . ESRD on hemodialysis     HD  . Hypertension   . Anemia   . Thyroid disease   . Cough     DRY    . Headache(784.0)   . Muscle spasms of neck     BACK, NECK  . Depression     Past Surgical History  Procedure Laterality Date  . Hemodialysis graft to left arm and left leg    . Parathyroidectomy N/A 04/24/2014    Procedure: TOTAL 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;  Surgeon: [name], MD;  Location: [facility];  Service: General;  Laterality: N/A;  NECK AND LEFT FOREARM    Allergies: Lisinopril  Medications: Prior to Admission medications   Medication Sig Start Date End Date Taking? Authorizing Provider  acetaminophen (TYLENOL) 500 MG tablet Take 1,000 mg by mouth every 6 (six) hours as needed for pain. For pain    Historical Provider, MD  amLODipine (NORVASC) 10 MG tablet Take 10 mg by mouth daily.    Historical Provider, MD  calcium carbonate (TUMS - DOSED IN MG ELEMENTAL CALCIUM) 500 MG chewable tablet Chew 2 tablets (400 mg of elemental calcium total) by mouth 2 (two) times daily between meals. 04/26/14   Judeth Horn, MD  cloNIDine (CATAPRES) 0.2 MG tablet Take 0.2 mg by mouth daily.     Historical Provider, MD     History reviewed. No pertinent family history.  History   Social History  . Marital Status: Single    Spouse Name: N/A  . Number of Children: N/A  . Years of Education: N/A   Social History Main Topics  . Smoking status: Former  Smoker    Quit date: 03/21/1986  . Smokeless tobacco: Never Used  . Alcohol Use: No  . Drug Use: No  . Sexual Activity: Not on file   Other Topics Concern  . None   Social History Narrative    Review of Systems: A 12 point ROS discussed and pertinent positives are indicated in the HPI above.  All other systems are negative.  Review of Systems  Constitutional: Negative for activity change and fatigue.  Respiratory: Negative for cough and shortness of breath.   Psychiatric/Behavioral: Negative for behavioral problems and confusion.    Vital Signs: BP 157/100 mmHg  Pulse 61  Resp 18  SpO2 100%  Physical Exam  Constitutional: He is oriented to person, place, and time.  Cardiovascular: Normal rate and regular rhythm.   No murmur heard. Pulmonary/Chest: Effort normal and breath sounds normal. He has no wheezes.  Abdominal: Soft. Bowel sounds are normal. There is no tenderness.  Musculoskeletal: Normal range of motion.  L thigh dialysis graft no thrill  No pulse  Neurological: He is alert and oriented to person, place, and time.  Skin: Skin is warm and dry.  Psychiatric: He has a normal mood and affect. His behavior is normal. Judgment and thought content normal.  Nursing note and vitals reviewed.   Mallampati Score:  MD Evaluation Airway: WNL Heart: WNL Abdomen: WNL Chest/  Lungs: WNL ASA  Classification: 3 Mallampati/Airway Score: One  Imaging: Ir Pta Venous Left  03/03/2015   CLINICAL DATA:  End-stage renal disease and clotted left thigh graft.  EXAM: DIALYSIS GRAFT DECLOT; GRAFT/VENOUS PTA; ULTRASOUND GUIDANCE FOR VASCULAR ACCESS  Physician: Stephan Minister. Anselm Pancoast, MD  FLUOROSCOPY TIME:  5 minutes and 54 seconds, 362 mGy  MEDICATIONS AND MEDICAL HISTORY: Heparin 3000 units, TPA 2 mg, Versed 1.0 mg, Fentanyl 150 mcg. A radiology nurse monitored the patient for moderate sedation.  ANESTHESIA/SEDATION: Moderate sedation time: 30 minutes  CONTRAST:  Fifty ml Omnipaque 300   PROCEDURE: The procedure was explained to the patient. The risks and benefits of the procedure were discussed and the patient's questions were addressed. Informed consent was obtained from the patient. The left thigh was prepped and draped in a sterile fashion. Maximal barrier sterile technique was utilized including caps, mask, sterile gowns, sterile gloves, sterile drape, hand hygiene and skin antiseptic. The skin was anesthetized with 1% lidocaine. The graft was accessed using 21 gauge needles towards the venous and arterial anastomoses with ultrasound guidance. Ultrasound images were obtained for documentation. Micropuncture catheters were placed. 2 mg of TPA was infused through the micropuncture catheters. The vascular access pointing towards the central veins was upsized to a 6-French vascular sheath. A 5-French catheter was advanced into the central venous structures and a central venogram was performed. Fluoroscopic images were saved for documentation. The catheter pointing toward the arterial anastomosis was exchanged for a 6-French sheath. The graft was treated with the Arrow PTD thrombectomy device. The arterial plug was pulled using a 5 [place] Fogarty balloon. There was flow in the graft. Angiogram demonstrated a patent graft with recurrent stenosis at the venous anastomosis. The venous anastomosis was treated 2 times with a 7 mm x 40 mm Conquest balloon. Shuntogram images were performed following each balloon dilatation. Reflux shuntogram images were obtained with a 5 French catheter and by occluding the graft with the Fogarty balloon. The vascular sheaths were removed with purse string sutures.  FINDINGS: The central veins are patent. At the end of the procedure, the graft was widely patent. There was residual narrowing at the venous anastomosis despite complete inflation of the angioplasty balloon. Findings suggest an elastic lesion. 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 The arterial anastomosis is widely patent.  Estimated blood loss: Minimal  COMPLICATIONS: None  IMPRESSION: Successful declot of the left thigh graft.  The left thigh graft remains amendable to percutaneous intervention.   Electronically Signed   By: Markus Daft M.D.   On: 03/03/2015 17:27   Ir Angio Av Shunt Addl Access  03/03/2015   CLINICAL DATA:  End-stage renal disease and clotted left thigh graft.  EXAM: DIALYSIS GRAFT DECLOT; GRAFT/VENOUS PTA; ULTRASOUND GUIDANCE FOR VASCULAR ACCESS  Physician: Stephan Minister. Anselm Pancoast, MD  FLUOROSCOPY TIME:  5 minutes and 54 seconds, 362 mGy  MEDICATIONS AND MEDICAL HISTORY: Heparin 3000 units, TPA 2 mg, Versed 1.0 mg, Fentanyl 150 mcg. A radiology nurse monitored the patient for moderate sedation.  ANESTHESIA/SEDATION: Moderate sedation time: 30 minutes  CONTRAST:  Fifty ml Omnipaque 300  PROCEDURE: The procedure was explained to the patient. The risks and benefits of the procedure were discussed and the patient's questions were addressed. Informed consent was obtained from the patient. The left thigh was prepped and draped in a sterile fashion. Maximal barrier sterile technique was utilized including caps, mask, sterile gowns, sterile gloves, sterile  drape, hand hygiene and skin antiseptic. The skin was anesthetized with 1% lidocaine. The graft was accessed using 21 gauge needles towards the venous and arterial anastomoses with ultrasound guidance. Ultrasound images were obtained for documentation. Micropuncture catheters were placed. 2 mg of TPA was infused through the micropuncture catheters. The vascular access pointing towards the central veins was upsized to a 6-French vascular sheath. A 5-French catheter was advanced into the central venous structures and a central venogram was performed. Fluoroscopic images were saved for documentation. The catheter pointing toward the arterial anastomosis was exchanged for a 6-French sheath. The graft was  treated with the Arrow PTD thrombectomy device. The arterial plug was pulled using a 5 [place] Fogarty balloon. There was flow in the graft. Angiogram demonstrated a patent graft with recurrent stenosis at the venous anastomosis. The venous anastomosis was treated 2 times with a 7 mm x 40 mm Conquest balloon. Shuntogram images were performed following each balloon dilatation. Reflux shuntogram images were obtained with a 5 French catheter and by occluding the graft with the Fogarty balloon. The vascular sheaths were removed with purse string sutures.  FINDINGS: The central veins are patent. At the end of the procedure, the graft was widely patent. There was residual narrowing at the venous anastomosis despite complete inflation of the angioplasty balloon. Findings suggest an elastic lesion. 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 The arterial anastomosis is widely patent.  Estimated blood loss: Minimal  COMPLICATIONS: None  IMPRESSION: Successful declot of the left thigh graft.  The left thigh graft remains amendable to percutaneous intervention.   Electronically Signed   By: Markus Daft M.D.   On: 03/03/2015 17:27   Ir US Guide Vasc Access Left  03/03/2015   CLINICAL DATA:  End-stage renal disease and clotted left thigh graft.  EXAM: DIALYSIS GRAFT DECLOT; GRAFT/VENOUS PTA; ULTRASOUND GUIDANCE FOR VASCULAR ACCESS  Physician: Stephan Minister. Anselm Pancoast, MD  FLUOROSCOPY TIME:  5 minutes and 54 seconds, 362 mGy  MEDICATIONS AND MEDICAL HISTORY: Heparin 3000 units, TPA 2 mg, Versed 1.0 mg, Fentanyl 150 mcg. A radiology nurse monitored the patient for moderate sedation.  ANESTHESIA/SEDATION: Moderate sedation time: 30 minutes  CONTRAST:  Fifty ml Omnipaque 300  PROCEDURE: The procedure was explained to the patient. The risks and benefits of the procedure were discussed and the patient's questions were addressed. Informed consent was obtained from the patient. The left  thigh was prepped and draped in a sterile fashion. Maximal barrier sterile technique was utilized including caps, mask, sterile gowns, sterile gloves, sterile drape, hand hygiene and skin antiseptic. The skin was anesthetized with 1% lidocaine. The graft was accessed using 21 gauge needles towards the venous and arterial anastomoses with ultrasound guidance. Ultrasound images were obtained for documentation. Micropuncture catheters were placed. 2 mg of TPA was infused through the micropuncture catheters. The vascular access pointing towards the central veins was upsized to a 6-French vascular sheath. A 5-French catheter was advanced into the central venous structures and a central venogram was performed. Fluoroscopic images were saved for documentation. The catheter pointing toward the arterial anastomosis was exchanged for a 6-French sheath. The graft was treated with the Arrow PTD thrombectomy device. The arterial plug was pulled using a 5 [place] Fogarty balloon. There was flow in the graft. Angiogram demonstrated a patent graft with recurrent stenosis at the venous anastomosis. The venous anastomosis was treated 2 times with a 7 mm x 40 mm Conquest balloon.  Shuntogram images were performed following each balloon dilatation. Reflux shuntogram images were obtained with a 5 French catheter and by occluding the graft with the Fogarty balloon. The vascular sheaths were removed with purse string sutures.  FINDINGS: The central veins are patent. At the end of the procedure, the graft was widely patent. There was residual narrowing at the venous anastomosis despite complete inflation of the angioplasty balloon. Findings suggest an elastic lesion. 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 The arterial anastomosis is widely patent.  Estimated blood loss: Minimal  COMPLICATIONS: None  IMPRESSION: Successful declot of the left thigh graft.  The left thigh graft  remains amendable to percutaneous intervention.   Electronically Signed   By: Markus Daft M.D.   On: 03/03/2015 17:27   Ir Declot Left Mod Sed  03/03/2015   CLINICAL DATA:  End-stage renal disease and clotted left thigh graft.  EXAM: DIALYSIS GRAFT DECLOT; GRAFT/VENOUS PTA; ULTRASOUND GUIDANCE FOR VASCULAR ACCESS  Physician: Stephan Minister. Anselm Pancoast, MD  FLUOROSCOPY TIME:  5 minutes and 54 seconds, 362 mGy  MEDICATIONS AND MEDICAL HISTORY: Heparin 3000 units, TPA 2 mg, Versed 1.0 mg, Fentanyl 150 mcg. A radiology nurse monitored the patient for moderate sedation.  ANESTHESIA/SEDATION: Moderate sedation time: 30 minutes  CONTRAST:  Fifty ml Omnipaque 300  PROCEDURE: The procedure was explained to the patient. The risks and benefits of the procedure were discussed and the patient's questions were addressed. Informed consent was obtained from the patient. The left thigh was prepped and draped in a sterile fashion. Maximal barrier sterile technique was utilized including caps, mask, sterile gowns, sterile gloves, sterile drape, hand hygiene and skin antiseptic. The skin was anesthetized with 1% lidocaine. The graft was accessed using 21 gauge needles towards the venous and arterial anastomoses with ultrasound guidance. Ultrasound images were obtained for documentation. Micropuncture catheters were placed. 2 mg of TPA was infused through the micropuncture catheters. The vascular access pointing towards the central veins was upsized to a 6-French vascular sheath. A 5-French catheter was advanced into the central venous structures and a central venogram was performed. Fluoroscopic images were saved for documentation. The catheter pointing toward the arterial anastomosis was exchanged for a 6-French sheath. The graft was treated with the Arrow PTD thrombectomy device. The arterial plug was pulled using a 5 [place] Fogarty balloon. There was flow in the graft. Angiogram demonstrated a patent graft with recurrent stenosis at the venous  anastomosis. The venous anastomosis was treated 2 times with a 7 mm x 40 mm Conquest balloon. Shuntogram images were performed following each balloon dilatation. Reflux shuntogram images were obtained with a 5 French catheter and by occluding the graft with the Fogarty balloon. The vascular sheaths were removed with purse string sutures.  FINDINGS: The central veins are patent. At the end of the procedure, the graft was widely patent. There was residual narrowing at the venous anastomosis despite complete inflation of the angioplasty balloon. Findings suggest an elastic lesion. 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 The arterial anastomosis is widely patent.  Estimated blood loss: Minimal  COMPLICATIONS: None  IMPRESSION: Successful declot of the left thigh graft.  The left thigh graft remains amendable to percutaneous intervention.   Electronically Signed   By: Markus Daft M.D.   On: 03/03/2015 17:27    Labs:  CBC:  Recent Labs  12/15/14 [id number] 12/17/14 1143 12/19/14 0405  03/02/15 0931 03/30/15 1346  WBC 8.6 4.9 4.6 5.6  --   HGB 10.5* 10.1* 9.6* 13.9 13.3  HCT 32.9* 31.6* 29.9* 41.7 39.0  PLT 232 329 303 183  --     COAGS: No results for input(s): INR, APTT in the last 8760 hours.  BMP:  Recent Labs  12/15/14 0608 12/17/14 1143 12/19/14 0405 03/02/15 0931 03/30/15 1346  NA 141 139 136 136 135  K 3.9 4.7 4.5 4.8 5.1  CL 98 97 96 91*  --   CO2 26 26 28 23   --   GLUCOSE 87 69* 83 121* 95  BUN 26* 55* 39* 92*  --   CALCIUM 7.9* 6.4* 6.8* 7.6*  --   CREATININE 11.27* 19.22* 13.52* 20.90*  --   GFRNONAA 5* 3* 4* 2*  --   GFRAA 6* 3* 5* 3*  --     LIVER FUNCTION TESTS:  Recent Labs  06/29/14 1417 12/05/14 1902 12/08/14 1351 12/13/14 1718 12/15/14 0608 12/17/14 1143 12/19/14 0405  BILITOT 0.2* 0.6 0.7 1.0  --   --   --   AST 10 14 44* 35  --   --   --   ALT 8 10 31 22   --   --   --   ALKPHOS 86 63 56 52   --   --   --   PROT 8.8* 8.0 7.1 8.0  --   --   --   ALBUMIN 4.1 3.5 3.3* 3.2* 2.7* 2.5* 2.5*    TUMOR MARKERS: No results for input(s): AFPTM, CEA, CA199, CHROMGRNA in the last 8760 hours.  Assessment and Plan:  Lt thigh graft clotted Scheduled for thrombolysis/thrombectomy with poss pta/stent. Poss dialysis catheter placement Risks and Benefits discussed with the patient including, but not limited to bleeding, infection, vascular injury, pulmonary embolism, need for tunneled HD catheter placement or even death. All of the patient's questions were answered, patient is agreeable to proceed. Consent signed and in chart.   Thank you for this interesting consult.  I greatly enjoyed meeting [name] and look forward to participating in their care.  SignedMonia Sabal A 03/30/2015, 2:08 PM   I spent a total of  20 Minutes   in face to face in clinical consultation, greater than 50% of which was counseling/coordinating care for L thigh graft declot

## 2015-05-22 ENCOUNTER — Encounter (HOSPITAL_COMMUNITY): Payer: Self-pay

## 2015-05-22 ENCOUNTER — Ambulatory Visit (HOSPITAL_COMMUNITY)
Admission: RE | Admit: 2015-05-22 | Discharge: 2015-05-22 | Disposition: A | Discharge status: Home / Self Care | Payer: Medicare Other | Source: Ambulatory Visit | Attending: Nephrology | Admitting: Nephrology

## 2015-05-22 ENCOUNTER — Other Ambulatory Visit (HOSPITAL_COMMUNITY): Payer: Self-pay | Admitting: Nephrology

## 2015-05-22 DIAGNOSIS — I120 Hypertensive chronic kidney disease with stage 5 chronic kidney disease or end stage renal disease: Secondary | ICD-10-CM | POA: Insufficient documentation

## 2015-05-22 DIAGNOSIS — E079 Disorder of thyroid, unspecified: Secondary | ICD-10-CM | POA: Insufficient documentation

## 2015-05-22 DIAGNOSIS — N186 End stage renal disease: Secondary | ICD-10-CM

## 2015-05-22 DIAGNOSIS — Y832 Surgical operation with anastomosis, bypass or graft as the cause of abnormal reaction of the patient, or of later complication, without mention of misadventure at the time of the procedure: Secondary | ICD-10-CM | POA: Insufficient documentation

## 2015-05-22 DIAGNOSIS — F329 Major depressive disorder, single episode, unspecified: Secondary | ICD-10-CM | POA: Insufficient documentation

## 2015-05-22 DIAGNOSIS — T829XXA Unspecified complication of cardiac and vascular prosthetic device, implant and graft, initial encounter: Secondary | ICD-10-CM | POA: Insufficient documentation

## 2015-05-22 DIAGNOSIS — Z992 Dependence on renal dialysis: Secondary | ICD-10-CM | POA: Insufficient documentation

## 2015-05-22 DIAGNOSIS — T82868A Thrombosis of vascular prosthetic devices, implants and grafts, initial encounter: Secondary | ICD-10-CM | POA: Insufficient documentation

## 2015-05-22 DIAGNOSIS — Z87891 Personal history of nicotine dependence: Secondary | ICD-10-CM | POA: Insufficient documentation

## 2015-05-22 LAB — POCT I-STAT, CHEM 8
BUN: 65 mg/dL — AB (ref 6–20)
CALCIUM ION: 1.21 mmol/L (ref 1.12–1.23)
CHLORIDE: 99 mmol/L — AB (ref 101–111)
Creatinine, Ser: 14.6 mg/dL — ABNORMAL HIGH (ref 0.61–1.24)
GLUCOSE: 96 mg/dL (ref 65–99)
HCT: 41 % (ref 39.0–52.0)
Hemoglobin: 13.9 g/dL (ref 13.0–17.0)
Potassium: 4.9 mmol/L (ref 3.5–5.1)
SODIUM: 137 mmol/L (ref 135–145)
TCO2: 27 mmol/L (ref 0–100)

## 2015-05-22 MED ORDER — CEFAZOLIN SODIUM-DEXTROSE 2-3 GM-% IV SOLR
INTRAVENOUS | Status: AC
  Filled 2015-05-22: qty 50

## 2015-05-22 MED ORDER — LIDOCAINE HCL 1 % IJ SOLN
INTRAMUSCULAR | Status: AC
  Filled 2015-05-22: qty 20

## 2015-05-22 MED ORDER — HEPARIN SODIUM (PORCINE) 1000 UNIT/ML IJ SOLN
INTRAMUSCULAR | Status: AC
  Filled 2015-05-22: qty 1

## 2015-05-22 MED ORDER — FENTANYL CITRATE (PF) 100 MCG/2ML IJ SOLN
INTRAMUSCULAR | Status: AC
  Filled 2015-05-22: qty 2

## 2015-05-22 MED ORDER — HEPARIN SODIUM (PORCINE) 1000 UNIT/ML IJ SOLN
3000.0000 [IU] | Freq: Once | INTRAMUSCULAR | Status: AC
  Administered 2015-05-22: 3000 [IU] via INTRAVENOUS

## 2015-05-22 MED ORDER — MIDAZOLAM HCL 2 MG/2ML IJ SOLN
INTRAMUSCULAR | Status: AC
  Filled 2015-05-22: qty 2

## 2015-05-22 MED ORDER — ALTEPLASE 100 MG IV SOLR
2.0000 mg | Freq: Once | INTRAVENOUS | Status: DC
  Filled 2015-05-22: qty 2

## 2015-05-22 MED ORDER — MIDAZOLAM HCL 2 MG/2ML IJ SOLN
INTRAMUSCULAR | Status: AC | PRN
  Administered 2015-05-22 (×2): 0.5 mg via INTRAVENOUS
  Administered 2015-05-22: 1 mg via INTRAVENOUS

## 2015-05-22 MED ORDER — CEFAZOLIN SODIUM-DEXTROSE 2-3 GM-% IV SOLR: 2.0000 g | Freq: Once | INTRAVENOUS | Status: DC

## 2015-05-22 MED ORDER — IOHEXOL 300 MG/ML  SOLN
100.0000 mL | Freq: Once | INTRAMUSCULAR | Status: DC | PRN
  Administered 2015-05-22: 50 mL via INTRAVENOUS
  Filled 2015-05-22: qty 100

## 2015-05-22 MED ORDER — FENTANYL CITRATE (PF) 100 MCG/2ML IJ SOLN
INTRAMUSCULAR | Status: AC | PRN
  Administered 2015-05-22: 50 ug via INTRAVENOUS
  Administered 2015-05-22 (×2): 25 ug via INTRAVENOUS
  Administered 2015-05-22: 50 ug via INTRAVENOUS

## 2015-05-22 NOTE — Sedation Documentation (Signed)
Patient is resting comfortably. 

## 2015-05-22 NOTE — Sedation Documentation (Signed)
Patient c/o pain.  

## 2015-05-22 NOTE — H&P (Signed)
Chief Complaint: Patient was seen in consultation today for thrombectomy of his left leg AV graft at the request of [facility]  Referring Physician(s): Dunham,Cynthia  History of Present Illness: [name] is a 44 y.o. male with ESRD on dialysis who is here today for thrombectomy of his left leg arteriovenous graft.  He states his blood pressure drops at times and his graft will clot off.  Last thrombectomy was June 27th by Dr. Earleen Newport.  He understands if this is unsuccessful he will need a tunneled HD cath Andris Flurry)   Past Medical History  Diagnosis Date  . ESRD on hemodialysis     HD  . Hypertension   . Anemia   . Thyroid disease   . Cough     DRY    . Headache(784.0)   . Muscle spasms of neck     BACK, NECK  . Depression     Past Surgical History  Procedure Laterality Date  . Hemodialysis graft to left arm and left leg    . Parathyroidectomy N/A 04/24/2014    Procedure: TOTAL 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;  Surgeon: [name], MD;  Location: [facility];  Service: General;  Laterality: N/A;  NECK AND LEFT FOREARM    Allergies: Lisinopril  Medications: Prior to Admission medications   Medication Sig Start Date End Date Taking? Authorizing Provider  acetaminophen (TYLENOL) 500 MG tablet Take 1,000 mg by mouth every 6 (six) hours as needed for pain. For pain   Yes Historical Provider, MD  calcium carbonate (TUMS - DOSED IN MG ELEMENTAL CALCIUM) 500 MG chewable tablet Chew 2 tablets (400 mg of elemental calcium total) by mouth 2 (two) times daily between meals. 04/26/14  Yes Judeth Horn, MD  cloNIDine (CATAPRES) 0.2 MG tablet Take 0.2 mg by mouth daily.    Yes Historical Provider, MD  amLODipine (NORVASC) 10 MG tablet Take 10 mg by mouth daily.    Historical Provider, MD     History reviewed. No pertinent family history.  Social History   Social History  . Marital Status: Single    Spouse Name: N/A  . Number of Children:  N/A  . Years of Education: N/A   Social History Main Topics  . Smoking status: Former Smoker    Quit date: 03/21/1986  . Smokeless tobacco: Never Used  . Alcohol Use: No  . Drug Use: No  . Sexual Activity: Not Asked   Other Topics Concern  . None   Social History Narrative     Review of Systems  Constitutional: Negative for fever, chills, activity change, appetite change and fatigue.  HENT: Negative.   Respiratory: Negative for cough and shortness of breath.   Cardiovascular: Negative for chest pain.  Gastrointestinal: Negative for nausea, vomiting, abdominal pain and abdominal distention.  Musculoskeletal: Negative.   Skin: Negative.   Neurological: Negative.   Psychiatric/Behavioral: Negative.     Vital Signs: BP 131/78 mmHg  Pulse 73  Temp(Src) 98 F (36.7 C)  Resp 20  Ht 6\' 1"  (1.854 m)  Wt 158 lb 11.7 oz (72 kg)  BMI 20.95 kg/m2  SpO2 100%  Physical Exam  Constitutional: He is oriented to person, place, and time. He appears well-developed and well-nourished.  HENT:  Head: Normocephalic and atraumatic.  Neck: Normal range of motion. Neck supple.  Cardiovascular: Normal rate, regular rhythm and normal heart sounds.   Pulmonary/Chest: Effort normal and breath sounds normal.  Abdominal: Soft. Bowel sounds are normal.  Musculoskeletal:  Normal range of motion.  Left leg graft no audible bruit  Neurological: He is alert and oriented to person, place, and time.  Skin: Skin is warm and dry.  Psychiatric: He has a normal mood and affect. His behavior is normal. Judgment and thought content normal.    Mallampati Score:  MD Evaluation Airway: WNL Heart: WNL Abdomen: WNL Chest/ Lungs: WNL ASA  Classification: 3 Mallampati/Airway Score: One  Imaging: No results found.  Labs:  CBC:  Recent Labs  12/15/14 0608 12/17/14 1143 12/19/14 0405 03/02/15 0931 03/30/15 1346 05/22/15 1237  WBC 8.6 4.9 4.6 5.6  --   --   HGB 10.5* 10.1* 9.6* 13.9 13.3 13.9    HCT 32.9* 31.6* 29.9* 41.7 39.0 41.0  PLT 232 329 303 183  --   --     COAGS: No results for input(s): INR, APTT in the last 8760 hours.  BMP:  Recent Labs  12/15/14 0608 12/17/14 1143 12/19/14 0405 03/02/15 0931 03/30/15 1346 05/22/15 1237  NA 141 139 136 136 135 137  K 3.9 4.7 4.5 4.8 5.1 4.9  CL 98 97 96 91*  --  99*  CO2 26 26 28 23   --   --   GLUCOSE 87 69* 83 121* 95 96  BUN 26* 55* 39* 92*  --  65*  CALCIUM 7.9* 6.4* 6.8* 7.6*  --   --   CREATININE 11.27* 19.22* 13.52* 20.90*  --  14.60*  GFRNONAA 5* 3* 4* 2*  --   --   GFRAA 6* 3* 5* 3*  --   --     LIVER FUNCTION TESTS:  Recent Labs  06/29/14 1417 12/05/14 1902 12/08/14 1351 12/13/14 1718 12/15/14 0608 12/17/14 1143 12/19/14 0405  BILITOT 0.2* 0.6 0.7 1.0  --   --   --   AST 10 14 44* 35  --   --   --   ALT 8 10 31 22   --   --   --   ALKPHOS 86 63 56 52  --   --   --   PROT 8.8* 8.0 7.1 8.0  --   --   --   ALBUMIN 4.1 3.5 3.3* 3.2* 2.7* 2.5* 2.5*    TUMOR MARKERS: No results for input(s): AFPTM, CEA, CA199, CHROMGRNA in the last 8760 hours.  Assessment and Plan:  ESRD with thrombosed left leg AV graft  Will proceed with thrombectomy, possible angioplasty if needed, of the left leg graft.  Possible placement of tunneled dialysis catheter if thrombectomy is unsuccessful.  Risks and Benefits discussed with the patient including, but not limited to bleeding, infection, vascular injury, pulmonary embolism, need for tunneled HD catheter placement or even death.  All of the patient's questions were answered, patient is agreeable to proceed. Consent signed and in chart.  Thank you for this interesting consult.  I greatly enjoyed meeting Jammie Abdou Mcpartland and look forward to participating in their care.  A copy of this report was sent to the requesting provider on this date.  Signed: Murrell Redden PA-C 05/22/2015, 12:46 PM   I spent a total of  15 Minutes in face to face in clinical  consultation, greater than 50% of which was counseling/coordinating care for thrombectomy of AV graft.

## 2015-05-22 NOTE — Discharge Instructions (Signed)
Embolectomy and Thrombectomy, Care After These instructions give you information on caring for yourself after your procedure. Your doctor may also give you more specific instructions. Call your doctor if you have any problems or questions after your procedure. HOME CARE  Only take medicine as told by your doctor. Your doctor may give you medicine to thin your blood (blood thinners).  Tell your doctor if you have bruises, bleeding including nose bleeds, or you throw up (vomit) blood. Tell your doctor if you have blood in your pee (urine) or poop (stools). Tell your doctor if your poop is black and tarry or red.  Keep all doctor visits as told.  Change your bandages (dressings) as told.  Do not swim, bathe, or shower unless your doctor says it is okay.  Do not use any tobacco products including cigarettes, chewing tobacco or electronic cigarettes.  Do not drink alcohol. GET HELP RIGHT AWAY IF:  You have bleeding from the surgery site.  You have sudden pain in the foot, leg, hand, or arm.  You have sudden belly (abdominal) pain.  Your face droops, you feel weak on one side of your body, or you have trouble speaking.  You have bloody poop.  You throw up blood.  You suddenly feel short of breath, weak, or dizzy.  You have chest pain.  You have drainage, redness, swelling, or pain at the surgery site.  You have a fever. MAKE SURE YOU:  Understand these instructions.  Will watch your condition.  Will get help right away if you are not doing well or get worse. Document Released: 01/13/2011 Document Revised: 02/03/2014 Document Reviewed: 01/13/2011 [facility] Patient Information 2015 [place], [place]. This information is not intended to replace advice given to you by your health care provider. Make sure you discuss any questions you have with your health care provider.

## 2015-07-27 ENCOUNTER — Encounter (HOSPITAL_COMMUNITY): Payer: Self-pay

## 2015-07-27 ENCOUNTER — Other Ambulatory Visit: Payer: Self-pay | Admitting: Radiology

## 2015-07-27 ENCOUNTER — Other Ambulatory Visit (HOSPITAL_COMMUNITY): Payer: Self-pay | Admitting: Nephrology

## 2015-07-27 ENCOUNTER — Ambulatory Visit (HOSPITAL_COMMUNITY)
Admission: RE | Admit: 2015-07-27 | Discharge: 2015-07-27 | Disposition: A | Discharge status: Home / Self Care | Payer: Medicare Other | Source: Ambulatory Visit | Attending: Nephrology | Admitting: Nephrology

## 2015-07-27 DIAGNOSIS — Z87891 Personal history of nicotine dependence: Secondary | ICD-10-CM | POA: Insufficient documentation

## 2015-07-27 DIAGNOSIS — T82868A Thrombosis of vascular prosthetic devices, implants and grafts, initial encounter: Secondary | ICD-10-CM | POA: Insufficient documentation

## 2015-07-27 DIAGNOSIS — Z992 Dependence on renal dialysis: Secondary | ICD-10-CM | POA: Insufficient documentation

## 2015-07-27 DIAGNOSIS — N186 End stage renal disease: Secondary | ICD-10-CM | POA: Insufficient documentation

## 2015-07-27 DIAGNOSIS — Y832 Surgical operation with anastomosis, bypass or graft as the cause of abnormal reaction of the patient, or of later complication, without mention of misadventure at the time of the procedure: Secondary | ICD-10-CM | POA: Insufficient documentation

## 2015-07-27 DIAGNOSIS — F329 Major depressive disorder, single episode, unspecified: Secondary | ICD-10-CM | POA: Insufficient documentation

## 2015-07-27 DIAGNOSIS — I120 Hypertensive chronic kidney disease with stage 5 chronic kidney disease or end stage renal disease: Secondary | ICD-10-CM | POA: Insufficient documentation

## 2015-07-27 DIAGNOSIS — E079 Disorder of thyroid, unspecified: Secondary | ICD-10-CM | POA: Insufficient documentation

## 2015-07-27 LAB — BASIC METABOLIC PANEL
Anion gap: 17 — ABNORMAL HIGH (ref 5–15)
BUN: 91 mg/dL — ABNORMAL HIGH (ref 6–20)
CALCIUM: 9.7 mg/dL (ref 8.9–10.3)
CO2: 27 mmol/L (ref 22–32)
CREATININE: 18.64 mg/dL — AB (ref 0.61–1.24)
Chloride: 94 mmol/L — ABNORMAL LOW (ref 101–111)
GFR calc non Af Amer: 3 mL/min — ABNORMAL LOW (ref >60)
GFR, EST AFRICAN AMERICAN: 3 mL/min — AB (ref >60)
Glucose, Bld: 94 mg/dL (ref 65–99)
Potassium: 4.7 mmol/L (ref 3.5–5.1)
Sodium: 138 mmol/L (ref 135–145)

## 2015-07-27 LAB — CBC
HEMATOCRIT: 35.7 % — AB (ref 39.0–52.0)
HEMOGLOBIN: 11.7 g/dL — AB (ref 13.0–17.0)
MCH: 31.7 pg (ref 26.0–34.0)
MCHC: 32.8 g/dL (ref 30.0–36.0)
MCV: 96.7 fL (ref 78.0–100.0)
PLATELETS: 196 10*3/uL (ref 150–400)
RBC: 3.69 MIL/uL — AB (ref 4.22–5.81)
RDW: 14 % (ref 11.5–15.5)
WBC: 5.2 10*3/uL (ref 4.0–10.5)

## 2015-07-27 LAB — APTT: aPTT: 32 seconds (ref 24–37)

## 2015-07-27 LAB — PROTIME-INR
INR: 1.05 (ref 0.00–1.49)
Prothrombin Time: 13.9 seconds (ref 11.6–15.2)

## 2015-07-27 MED ORDER — MIDAZOLAM HCL 2 MG/2ML IJ SOLN
INTRAMUSCULAR | Status: AC
  Filled 2015-07-27: qty 6

## 2015-07-27 MED ORDER — LIDOCAINE HCL 1 % IJ SOLN
INTRAMUSCULAR | Status: AC
  Filled 2015-07-27: qty 20

## 2015-07-27 MED ORDER — FENTANYL CITRATE (PF) 100 MCG/2ML IJ SOLN
INTRAMUSCULAR | Status: AC | PRN
  Administered 2015-07-27: 50 ug via INTRAVENOUS
  Administered 2015-07-27: 25 ug via INTRAVENOUS
  Administered 2015-07-27: 50 ug via INTRAVENOUS
  Administered 2015-07-27: 25 ug via INTRAVENOUS

## 2015-07-27 MED ORDER — ALTEPLASE 100 MG IV SOLR
4.0000 mg | Freq: Once | INTRAVENOUS | Status: DC
  Filled 2015-07-27: qty 4

## 2015-07-27 MED ORDER — FENTANYL CITRATE (PF) 100 MCG/2ML IJ SOLN
INTRAMUSCULAR | Status: AC
  Filled 2015-07-27: qty 4

## 2015-07-27 MED ORDER — SODIUM CHLORIDE 0.9 % IV SOLN: Freq: Once | INTRAVENOUS | Status: DC

## 2015-07-27 MED ORDER — HEPARIN SODIUM (PORCINE) 1000 UNIT/ML IJ SOLN
INTRAMUSCULAR | Status: AC
  Filled 2015-07-27: qty 1

## 2015-07-27 MED ORDER — ALTEPLASE 100 MG IV SOLR
INTRAVENOUS | Status: AC | PRN
  Administered 2015-07-27: 4 mg

## 2015-07-27 MED ORDER — MIDAZOLAM HCL 2 MG/2ML IJ SOLN
INTRAMUSCULAR | Status: AC | PRN
  Administered 2015-07-27: 1 mg via INTRAVENOUS
  Administered 2015-07-27 (×2): 0.5 mg via INTRAVENOUS
  Administered 2015-07-27: 1 mg via INTRAVENOUS

## 2015-07-27 MED ORDER — IOHEXOL 300 MG/ML  SOLN: 100.0000 mL | Freq: Once | INTRAMUSCULAR | Status: DC | PRN

## 2015-07-27 NOTE — Procedures (Signed)
Interventional Radiology Procedure Note  Procedure: Left AV Loop graft declot. [employer], with 4mg  tPA. 84mm and 52mm venous anastamosis plasty 81mm arterial anastamosis plasty  Findings:  No arterial narrowing of the left iliac system to account for inflow problem. Occlusion at the arterial anastamosis, open after plasty Occlusion at the venous anastamosis, open after plasty. 2 pseudoaneurysm of the graft loop.   Suspect a recurrent scar/recalcitrant lesion at the venous end, just beyond the graft anastamosis.  Cannot effectively balloon this lesion because of proximity to the synthetic graft, and "watermellon seed" behavior of plasty balloons.     Complications: None Recommendations:  - Ok for dialysis - Dialysis to remove stay sutures. - Would refer for surgical evaluation, as the graft has become occluded May, June, August, and now October of 2016.   - Routine care   Signed,  Dulcy Fanny. Earleen Newport, DO

## 2015-07-27 NOTE — H&P (Signed)
Chief Complaint: Patient was seen in consultation today for R thigh thrombolysis/thrombectomy at the request of Dunham,Cynthia  Referring Physician(s): Dunham,Cynthia  History of Present Illness: [name] is a 44 y.o. male   ESRD Used Rt thigh graft for dialysis Last use Oct 21---no issue Most recent intervention 819/2016---successful:  1. Technically successful declot of left thigh synthetic hemodialysis graft. 2. Technically successful balloon angioplasty of recurrent venous anastomotic stenosis. 3. Technically successful 5 mm balloon angioplasty of recurrent arterial anastomotic stenosis.  Noted no thrill/pulse yesterday am Now scheduled for Rt thigh dialysis graft thrombolysis/thrombectomy with possible angioplasty/stent placement. Possible dialysis catheter placement  Past Medical History  Diagnosis Date  . ESRD on hemodialysis ([place])     HD  . Hypertension   . Anemia   . Thyroid disease   . Cough     DRY    . Headache(784.0)   . Muscle spasms of neck     BACK, NECK  . Depression     Past Surgical History  Procedure Laterality Date  . Hemodialysis graft to left arm and left leg    . Parathyroidectomy N/A 04/24/2014    Procedure: TOTAL 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;  Surgeon: [name], MD;  Location: [place];  Service: General;  Laterality: N/A;  NECK AND LEFT FOREARM    Allergies: Lisinopril  Medications: Prior to Admission medications   Medication Sig Start Date End Date Taking? Authorizing Provider  acetaminophen (TYLENOL) 500 MG tablet Take 1,000 mg by mouth every 6 (six) hours as needed for pain. For pain    Historical Provider, MD  amLODipine (NORVASC) 10 MG tablet Take 10 mg by mouth daily.    Historical Provider, MD  calcium carbonate (TUMS - DOSED IN MG ELEMENTAL CALCIUM) 500 MG chewable tablet Chew 2 tablets (400 mg of elemental calcium total) by mouth 2 (two) times daily between meals. 04/26/14   Judeth Horn,  MD  cloNIDine (CATAPRES) 0.2 MG tablet Take 0.2 mg by mouth daily.     Historical Provider, MD     History reviewed. No pertinent family history.  Social History   Social History  . Marital Status: Single    Spouse Name: N/A  . Number of Children: N/A  . Years of Education: N/A   Social History Main Topics  . Smoking status: Former Smoker    Quit date: 03/21/1986  . Smokeless tobacco: Never Used  . Alcohol Use: No  . Drug Use: No  . Sexual Activity: Not Asked   Other Topics Concern  . None   Social History Narrative    Review of Systems: A 12 point ROS discussed and pertinent positives are indicated in the HPI above.  All other systems are negative.  Review of Systems  Constitutional: Negative for fever, activity change, appetite change and fatigue.  Respiratory: Negative for shortness of breath.   Cardiovascular: Negative for chest pain.  Gastrointestinal: Negative for abdominal pain.  Neurological: Negative for weakness.  Psychiatric/Behavioral: Negative for behavioral problems and confusion.    Vital Signs: BP 128/80 mmHg  Pulse 66  Temp(Src) 97.7 F (36.5 C)  Resp 20  Ht 6' (1.829 m)  Wt 158 lb (71.668 kg)  BMI 21.42 kg/m2  SpO2 100%  Physical Exam  Cardiovascular: Normal rate, regular rhythm, normal heart sounds and intact distal pulses.   Pulmonary/Chest: Effort normal and breath sounds normal. He has no wheezes.  Abdominal: Soft. Bowel sounds are normal. There is no tenderness.  Musculoskeletal: Normal  range of motion.  Clotted Rt thigh dialysis graft  Neurological: He is alert. He has normal reflexes.  Skin: Skin is warm and dry.  Psychiatric: He has a normal mood and affect. His behavior is normal. Judgment and thought content normal.  Nursing note and vitals reviewed.   Mallampati Score:  MD Evaluation Airway: WNL Heart: WNL Abdomen: WNL Chest/ Lungs: WNL ASA  Classification: 3 Mallampati/Airway Score: One  Imaging: No results  found.  Labs:  CBC:  Recent Labs  12/17/14 1143 12/19/14 0405 03/02/15 0931 03/30/15 1346 05/22/15 1237 07/27/15 1145  WBC 4.9 4.6 5.6  --   --  5.2  HGB 10.1* 9.6* 13.9 13.3 13.9 11.7*  HCT 31.6* 29.9* 41.7 39.0 41.0 35.7*  PLT 329 303 183  --   --  196    COAGS:  Recent Labs  07/27/15 1145  INR 1.05  APTT 32    BMP:  Recent Labs  12/15/14 0608 12/17/14 1143 12/19/14 0405 03/02/15 0931 03/30/15 1346 05/22/15 1237  NA 141 139 136 136 135 137  K 3.9 4.7 4.5 4.8 5.1 4.9  CL 98 97 96 91*  --  99*  CO2 26 26 28 23   --   --   GLUCOSE 87 69* 83 121* 95 96  BUN 26* 55* 39* 92*  --  65*  CALCIUM 7.9* 6.4* 6.8* 7.6*  --   --   CREATININE 11.27* 19.22* 13.52* 20.90*  --  14.60*  GFRNONAA 5* 3* 4* 2*  --   --   GFRAA 6* 3* 5* 3*  --   --     LIVER FUNCTION TESTS:  Recent Labs  12/05/14 1902 12/08/14 1351 12/13/14 1718 12/15/14 0608 12/17/14 1143 12/19/14 0405  BILITOT 0.6 0.7 1.0  --   --   --   AST 14 44* 35  --   --   --   ALT 10 31 22   --   --   --   ALKPHOS 63 56 52  --   --   --   PROT 8.0 7.1 8.0  --   --   --   ALBUMIN 3.5 3.3* 3.2* 2.7* 2.5* 2.5*    TUMOR MARKERS: No results for input(s): AFPTM, CEA, CA199, CHROMGRNA in the last 8760 hours.  Assessment and Plan:  ESRD Clotted Rt thigh graft since 07/26/15 Scheduled for declot procedure Previous successful intervention same graft 05/22/2015 Risks and Benefits discussed with the patient including, but not limited to bleeding, infection, vascular injury, pulmonary embolism, need for tunneled HD catheter placement or even death. All of the patient's questions were answered, patient is agreeable to proceed. Consent signed and in chart.   Thank you for this interesting consult.  I greatly enjoyed meeting [name] and look forward to participating in their care.  A copy of this report was sent to the requesting provider on this date.  Signed: Astria Jordahl A 07/27/2015, 12:28  PM   I spent a total of  30 Minutes   in face to face in clinical consultation, greater than 50% of which was counseling/coordinating care for rt thigh graft declot

## 2015-07-27 NOTE — Discharge Instructions (Signed)
Fistulogram, Care After °Refer to this sheet in the next few weeks. These instructions provide you with information on caring for yourself after your procedure. Your health care provider may also give you more specific instructions. Your treatment has been planned according to current medical practices, but problems sometimes occur. Call your health care provider if you have any problems or questions after your procedure. °WHAT TO EXPECT AFTER THE PROCEDURE °After your procedure, it is typical to have the following: °· A small amount of discomfort in the area where the catheters were placed. °· A small amount of bruising around the fistula. °· Sleepiness and fatigue. °HOME CARE INSTRUCTIONS °· Rest at home for the day following your procedure. °· Do not drive or operate heavy machinery while taking pain medicine. °· Take medicines only as directed by your health care provider. °· Do not take baths, swim, or use a hot tub until your health care provider approves. You may shower 24 hours after the procedure or as directed by your health care provider. °· There are many different ways to close and cover an incision, including stitches, skin glue, and adhesive strips. Follow your health care provider's instructions on: °¨ Incision care. °¨ Bandage (dressing) changes and removal. °¨ Incision closure removal. °· Monitor your dialysis fistula carefully. °SEEK MEDICAL CARE IF: °· You have drainage, redness, swelling, or pain at your catheter site. °· You have a fever. °· You have chills. °SEEK IMMEDIATE MEDICAL CARE IF: °· You feel weak. °· You have trouble balancing. °· You have trouble moving your arms or legs. °· You have problems with your speech or vision. °· You can no longer feel a vibration or buzz when you put your fingers over your dialysis fistula. °· The limb that was used for the procedure: °¨ Swells. °¨ Is painful. °¨ Is cold. °¨ Is discolored, such as blue or pale white. °  °This information is not intended  to replace advice given to you by your health care provider. Make sure you discuss any questions you have with your health care provider. °  °Document Released: 02/03/2014 Document Reviewed: 02/03/2014 °Elsevier Interactive Patient Education ©2016 Elsevier Inc. ° °

## 2015-07-27 NOTE — Sedation Documentation (Addendum)
Successful declot. Will recover 1hr then be D/C

## 2015-07-27 NOTE — Sedation Documentation (Signed)
Patient is resting comfortably. 

## 2015-08-18 ENCOUNTER — Ambulatory Visit (HOSPITAL_COMMUNITY): Payer: Medicare Other | Admitting: Anesthesiology

## 2015-08-18 ENCOUNTER — Other Ambulatory Visit: Payer: Self-pay

## 2015-08-18 ENCOUNTER — Encounter (HOSPITAL_COMMUNITY): Payer: Self-pay | Admitting: Anesthesiology

## 2015-08-18 ENCOUNTER — Encounter (HOSPITAL_COMMUNITY)
Admission: RE | Disposition: A | Discharge status: Home / Self Care | Payer: Self-pay | Source: Ambulatory Visit | Attending: Surgery

## 2015-08-18 ENCOUNTER — Observation Stay (HOSPITAL_COMMUNITY)
Admission: RE | Admit: 2015-08-18 | Discharge: 2015-08-19 | Disposition: A | Discharge status: Home / Self Care | Payer: Medicare Other | Source: Ambulatory Visit | Attending: Surgery | Admitting: Surgery

## 2015-08-18 DIAGNOSIS — N2581 Secondary hyperparathyroidism of renal origin: Secondary | ICD-10-CM | POA: Insufficient documentation

## 2015-08-18 DIAGNOSIS — E079 Disorder of thyroid, unspecified: Secondary | ICD-10-CM | POA: Insufficient documentation

## 2015-08-18 DIAGNOSIS — R197 Diarrhea, unspecified: Secondary | ICD-10-CM | POA: Insufficient documentation

## 2015-08-18 DIAGNOSIS — T82868A Thrombosis of vascular prosthetic devices, implants and grafts, initial encounter: Secondary | ICD-10-CM | POA: Diagnosis not present

## 2015-08-18 DIAGNOSIS — I120 Hypertensive chronic kidney disease with stage 5 chronic kidney disease or end stage renal disease: Secondary | ICD-10-CM | POA: Insufficient documentation

## 2015-08-18 DIAGNOSIS — Z87891 Personal history of nicotine dependence: Secondary | ICD-10-CM | POA: Insufficient documentation

## 2015-08-18 DIAGNOSIS — N186 End stage renal disease: Secondary | ICD-10-CM | POA: Insufficient documentation

## 2015-08-18 DIAGNOSIS — F329 Major depressive disorder, single episode, unspecified: Secondary | ICD-10-CM | POA: Insufficient documentation

## 2015-08-18 DIAGNOSIS — Z992 Dependence on renal dialysis: Secondary | ICD-10-CM | POA: Insufficient documentation

## 2015-08-18 DIAGNOSIS — Y832 Surgical operation with anastomosis, bypass or graft as the cause of abnormal reaction of the patient, or of later complication, without mention of misadventure at the time of the procedure: Secondary | ICD-10-CM | POA: Insufficient documentation

## 2015-08-18 DIAGNOSIS — D649 Anemia, unspecified: Secondary | ICD-10-CM | POA: Insufficient documentation

## 2015-08-18 HISTORY — PX: THROMBECTOMY / ARTERIOVENOUS GRAFT REVISION: SUR1351

## 2015-08-18 HISTORY — PX: THROMBECTOMY W/ EMBOLECTOMY: SHX2507

## 2015-08-18 LAB — CREATININE, SERUM
Creatinine, Ser: 15.53 mg/dL — ABNORMAL HIGH (ref 0.61–1.24)
GFR, EST AFRICAN AMERICAN: 4 mL/min — AB (ref >60)
GFR, EST NON AFRICAN AMERICAN: 3 mL/min — AB (ref >60)

## 2015-08-18 LAB — CBC
HEMATOCRIT: 34.5 % — AB (ref 39.0–52.0)
Hemoglobin: 11.1 g/dL — ABNORMAL LOW (ref 13.0–17.0)
MCH: 31.8 pg (ref 26.0–34.0)
MCHC: 32.2 g/dL (ref 30.0–36.0)
MCV: 98.9 fL (ref 78.0–100.0)
Platelets: 178 10*3/uL (ref 150–400)
RBC: 3.49 MIL/uL — AB (ref 4.22–5.81)
RDW: 14.4 % (ref 11.5–15.5)
WBC: 8.6 10*3/uL (ref 4.0–10.5)

## 2015-08-18 LAB — POCT I-STAT 4, (NA,K, GLUC, HGB,HCT)
GLUCOSE: 87 mg/dL (ref 65–99)
Glucose, Bld: 76 mg/dL (ref 65–99)
HEMATOCRIT: 36 % — AB (ref 39.0–52.0)
HEMATOCRIT: 39 % (ref 39.0–52.0)
HEMOGLOBIN: 12.2 g/dL — AB (ref 13.0–17.0)
Hemoglobin: 13.3 g/dL (ref 13.0–17.0)
POTASSIUM: 4.3 mmol/L (ref 3.5–5.1)
Potassium: >8.5 mmol/L (ref 3.5–5.1)
SODIUM: 132 mmol/L — AB (ref 135–145)
Sodium: 139 mmol/L (ref 135–145)

## 2015-08-18 SURGERY — THROMBECTOMY ARTERIOVENOUS GORE-TEX GRAFT
Anesthesia: General | Laterality: Left

## 2015-08-18 MED ORDER — SODIUM CHLORIDE 0.9 % IV SOLN
INTRAVENOUS | Status: DC | PRN
  Administered 2015-08-18: 12:00:00

## 2015-08-18 MED ORDER — LABETALOL HCL 5 MG/ML IV SOLN: 10.0000 mg | INTRAVENOUS | Status: DC | PRN

## 2015-08-18 MED ORDER — HEMOSTATIC AGENTS (NO CHARGE) OPTIME
TOPICAL | Status: DC | PRN
  Administered 2015-08-18: 1 via TOPICAL

## 2015-08-18 MED ORDER — ACETAMINOPHEN 325 MG RE SUPP: 325.0000 mg | RECTAL | Status: DC | PRN

## 2015-08-18 MED ORDER — DEXAMETHASONE SODIUM PHOSPHATE 4 MG/ML IJ SOLN
INTRAMUSCULAR | Status: DC | PRN
Start: 2015-08-18 — End: 2015-08-18
  Administered 2015-08-18: 4 mg via INTRAVENOUS

## 2015-08-18 MED ORDER — SODIUM CHLORIDE 0.9 % IV SOLN
INTRAVENOUS | Status: DC
  Administered 2015-08-18: 18:00:00 via INTRAVENOUS

## 2015-08-18 MED ORDER — PHENYLEPHRINE 40 MCG/ML (10ML) SYRINGE FOR IV PUSH (FOR BLOOD PRESSURE SUPPORT)
PREFILLED_SYRINGE | INTRAVENOUS | Status: AC
  Filled 2015-08-18: qty 10

## 2015-08-18 MED ORDER — CHLORHEXIDINE GLUCONATE CLOTH 2 % EX PADS: 6.0000 | MEDICATED_PAD | Freq: Once | CUTANEOUS | Status: DC

## 2015-08-18 MED ORDER — PHENYLEPHRINE 40 MCG/ML (10ML) SYRINGE FOR IV PUSH (FOR BLOOD PRESSURE SUPPORT)
PREFILLED_SYRINGE | INTRAVENOUS | Status: AC
  Filled 2015-08-18: qty 20

## 2015-08-18 MED ORDER — ADULT MULTIVITAMIN W/MINERALS CH
1.0000 | ORAL_TABLET | Freq: Every day | ORAL | Status: DC
  Administered 2015-08-19: 1 via ORAL
  Filled 2015-08-18: qty 1

## 2015-08-18 MED ORDER — MORPHINE SULFATE (PF) 2 MG/ML IV SOLN: 2.0000 mg | INTRAVENOUS | Status: DC | PRN

## 2015-08-18 MED ORDER — HEPARIN SODIUM (PORCINE) 1000 UNIT/ML IJ SOLN
INTRAMUSCULAR | Status: DC | PRN
  Administered 2015-08-18: 7000 [IU] via INTRAVENOUS

## 2015-08-18 MED ORDER — PROTAMINE SULFATE 10 MG/ML IV SOLN
INTRAVENOUS | Status: AC
  Filled 2015-08-18: qty 5

## 2015-08-18 MED ORDER — EPHEDRINE SULFATE 50 MG/ML IJ SOLN
INTRAMUSCULAR | Status: DC | PRN
  Administered 2015-08-18: 10 mg via INTRAVENOUS
  Administered 2015-08-18: 5 mg via INTRAVENOUS
  Administered 2015-08-18 (×2): 10 mg via INTRAVENOUS

## 2015-08-18 MED ORDER — OXYCODONE-ACETAMINOPHEN 5-325 MG PO TABS: 1.0000 | ORAL_TABLET | ORAL | Status: DC | PRN

## 2015-08-18 MED ORDER — PROPOFOL 10 MG/ML IV BOLUS
INTRAVENOUS | Status: DC | PRN
  Administered 2015-08-18: 300 mg via INTRAVENOUS

## 2015-08-18 MED ORDER — ONDANSETRON HCL 4 MG/2ML IJ SOLN
INTRAMUSCULAR | Status: DC | PRN
  Administered 2015-08-18: 4 mg via INTRAVENOUS

## 2015-08-18 MED ORDER — MIDAZOLAM HCL 5 MG/5ML IJ SOLN
INTRAMUSCULAR | Status: DC | PRN
  Administered 2015-08-18: 2 mg via INTRAVENOUS

## 2015-08-18 MED ORDER — LIDOCAINE HCL (CARDIAC) 20 MG/ML IV SOLN
INTRAVENOUS | Status: AC
  Filled 2015-08-18: qty 5

## 2015-08-18 MED ORDER — MIDAZOLAM HCL 2 MG/2ML IJ SOLN
INTRAMUSCULAR | Status: AC
  Filled 2015-08-18: qty 4

## 2015-08-18 MED ORDER — OXYCODONE-ACETAMINOPHEN 5-325 MG PO TABS
1.0000 | ORAL_TABLET | ORAL | Status: DC | PRN
  Filled 2015-08-18: qty 2

## 2015-08-18 MED ORDER — PROPOFOL 10 MG/ML IV BOLUS
INTRAVENOUS | Status: AC
  Filled 2015-08-18: qty 20

## 2015-08-18 MED ORDER — METOPROLOL TARTRATE 1 MG/ML IV SOLN: 2.0000 mg | INTRAVENOUS | Status: DC | PRN

## 2015-08-18 MED ORDER — SODIUM CHLORIDE 0.9 % IV SOLN: 500.0000 mL | Freq: Once | INTRAVENOUS | Status: DC | PRN

## 2015-08-18 MED ORDER — FENTANYL CITRATE (PF) 100 MCG/2ML IJ SOLN
INTRAMUSCULAR | Status: DC | PRN
  Administered 2015-08-18: 50 ug via INTRAVENOUS
  Administered 2015-08-18: 100 ug via INTRAVENOUS
  Administered 2015-08-18 (×2): 25 ug via INTRAVENOUS
  Administered 2015-08-18: 50 ug via INTRAVENOUS

## 2015-08-18 MED ORDER — SODIUM CHLORIDE 0.9 % IV SOLN
INTRAVENOUS | Status: DC
  Administered 2015-08-18 (×2): via INTRAVENOUS

## 2015-08-18 MED ORDER — EPHEDRINE SULFATE 50 MG/ML IJ SOLN
INTRAMUSCULAR | Status: AC
  Filled 2015-08-18: qty 2

## 2015-08-18 MED ORDER — HYDROMORPHONE HCL 1 MG/ML IJ SOLN: 0.2500 mg | INTRAMUSCULAR | Status: DC | PRN

## 2015-08-18 MED ORDER — DEXTROSE 5 % IV SOLN
1.5000 g | INTRAVENOUS | Status: AC
  Administered 2015-08-18: 1.5 g via INTRAVENOUS

## 2015-08-18 MED ORDER — SODIUM CHLORIDE 0.9 % IJ SOLN
INTRAMUSCULAR | Status: AC
  Filled 2015-08-18: qty 10

## 2015-08-18 MED ORDER — GUAIFENESIN-DM 100-10 MG/5ML PO SYRP: 15.0000 mL | ORAL_SOLUTION | ORAL | Status: DC | PRN

## 2015-08-18 MED ORDER — HYDROMORPHONE HCL 1 MG/ML IJ SOLN
INTRAMUSCULAR | Status: AC
  Filled 2015-08-18: qty 1

## 2015-08-18 MED ORDER — DEXTROSE 5 % IV SOLN
1.5000 g | Freq: Two times a day (BID) | INTRAVENOUS | Status: AC
  Administered 2015-08-19: 1.5 g via INTRAVENOUS
  Filled 2015-08-18: qty 1.5

## 2015-08-18 MED ORDER — ROCURONIUM BROMIDE 50 MG/5ML IV SOLN
INTRAVENOUS | Status: AC
  Filled 2015-08-18: qty 1

## 2015-08-18 MED ORDER — VECURONIUM BROMIDE 10 MG IV SOLR
INTRAVENOUS | Status: AC
  Filled 2015-08-18: qty 10

## 2015-08-18 MED ORDER — HEPARIN SODIUM (PORCINE) 1000 UNIT/ML IJ SOLN
INTRAMUSCULAR | Status: AC
  Filled 2015-08-18: qty 1

## 2015-08-18 MED ORDER — STERILE WATER FOR INJECTION IJ SOLN
INTRAMUSCULAR | Status: AC
  Filled 2015-08-18: qty 10

## 2015-08-18 MED ORDER — DEXTROSE 5 % IV SOLN
1.5000 g | INTRAVENOUS | Status: DC
  Filled 2015-08-18: qty 1.5

## 2015-08-18 MED ORDER — ACETAMINOPHEN 325 MG PO TABS
325.0000 mg | ORAL_TABLET | ORAL | Status: DC | PRN
  Administered 2015-08-19: 650 mg via ORAL
  Filled 2015-08-18: qty 2

## 2015-08-18 MED ORDER — PHENOL 1.4 % MT LIQD: 1.0000 | OROMUCOSAL | Status: DC | PRN

## 2015-08-18 MED ORDER — 0.9 % SODIUM CHLORIDE (POUR BTL) OPTIME
TOPICAL | Status: DC | PRN
  Administered 2015-08-18: 1000 mL

## 2015-08-18 MED ORDER — PANTOPRAZOLE SODIUM 40 MG PO TBEC
40.0000 mg | DELAYED_RELEASE_TABLET | Freq: Every day | ORAL | Status: DC
  Administered 2015-08-18 – 2015-08-19 (×2): 40 mg via ORAL
  Filled 2015-08-18 (×2): qty 1

## 2015-08-18 MED ORDER — ONDANSETRON HCL 4 MG/2ML IJ SOLN: 4.0000 mg | Freq: Four times a day (QID) | INTRAMUSCULAR | Status: DC | PRN

## 2015-08-18 MED ORDER — DEXAMETHASONE SODIUM PHOSPHATE 4 MG/ML IJ SOLN
INTRAMUSCULAR | Status: AC
  Filled 2015-08-18: qty 1

## 2015-08-18 MED ORDER — LIDOCAINE HCL (CARDIAC) 20 MG/ML IV SOLN
INTRAVENOUS | Status: DC | PRN
  Administered 2015-08-18: 60 mg via INTRAVENOUS

## 2015-08-18 MED ORDER — PROTAMINE SULFATE 10 MG/ML IV SOLN
INTRAVENOUS | Status: DC | PRN
  Administered 2015-08-18: 50 mg via INTRAVENOUS

## 2015-08-18 MED ORDER — HYDRALAZINE HCL 20 MG/ML IJ SOLN: 5.0000 mg | INTRAMUSCULAR | Status: DC | PRN

## 2015-08-18 MED ORDER — FENTANYL CITRATE (PF) 250 MCG/5ML IJ SOLN
INTRAMUSCULAR | Status: AC
  Filled 2015-08-18: qty 5

## 2015-08-18 MED ORDER — ALUM & MAG HYDROXIDE-SIMETH 200-200-20 MG/5ML PO SUSP: 15.0000 mL | ORAL | Status: DC | PRN

## 2015-08-18 MED ORDER — DOCUSATE SODIUM 100 MG PO CAPS
100.0000 mg | ORAL_CAPSULE | Freq: Every day | ORAL | Status: DC
  Administered 2015-08-19: 100 mg via ORAL
  Filled 2015-08-18: qty 1

## 2015-08-18 MED ORDER — SODIUM CHLORIDE 0.9 % IV SOLN
INTRAVENOUS | Status: DC
Start: 2015-08-18 — End: 2015-08-18

## 2015-08-18 MED ORDER — OXYCODONE-ACETAMINOPHEN 5-325 MG PO TABS
ORAL_TABLET | ORAL | Status: AC
  Administered 2015-08-18: 2
  Filled 2015-08-18: qty 2

## 2015-08-18 MED ORDER — HEPARIN SODIUM (PORCINE) 5000 UNIT/ML IJ SOLN
5000.0000 [IU] | Freq: Three times a day (TID) | INTRAMUSCULAR | Status: DC
  Filled 2015-08-18: qty 1

## 2015-08-18 MED ORDER — ONDANSETRON HCL 4 MG/2ML IJ SOLN
INTRAMUSCULAR | Status: AC
  Filled 2015-08-18: qty 2

## 2015-08-18 MED ORDER — PHENYLEPHRINE HCL 10 MG/ML IJ SOLN
INTRAMUSCULAR | Status: DC | PRN
  Administered 2015-08-18 (×2): 80 ug via INTRAVENOUS
  Administered 2015-08-18: 40 ug via INTRAVENOUS
  Administered 2015-08-18: 80 ug via INTRAVENOUS
  Administered 2015-08-18 (×2): 40 ug via INTRAVENOUS

## 2015-08-18 MED ORDER — CALCIUM CARBONATE ANTACID 500 MG PO CHEW
2.0000 | CHEWABLE_TABLET | Freq: Two times a day (BID) | ORAL | Status: DC
  Administered 2015-08-19: 400 mg via ORAL
  Filled 2015-08-18: qty 2

## 2015-08-18 SURGICAL SUPPLY — 41 items
CANISTER SUCTION 2500CC (MISCELLANEOUS) ×3 IMPLANT
CATH EMB 3FR 80CM (CATHETERS) ×2 IMPLANT
CLIP TI MEDIUM 6 (CLIP) ×5 IMPLANT
CLIP TI WIDE RED SMALL 6 (CLIP) ×3 IMPLANT
DRAPE INCISE IOBAN 66X45 STRL (DRAPES) ×4 IMPLANT
DRAPE ORTHO SPLIT 77X108 STRL (DRAPES) ×3
DRAPE SURG ORHT 6 SPLT 77X108 (DRAPES) IMPLANT
ELECT REM PT RETURN 9FT ADLT (ELECTROSURGICAL) ×3
ELECTRODE REM PT RTRN 9FT ADLT (ELECTROSURGICAL) ×1 IMPLANT
GEL ULTRASOUND 20GR AQUASONIC (MISCELLANEOUS) IMPLANT
GLOVE BIO SURGEON STRL SZ 6.5 (GLOVE) ×1 IMPLANT
GLOVE BIO SURGEONS STRL SZ 6.5 (GLOVE) ×1
GLOVE BIOGEL PI IND STRL 6.5 (GLOVE) IMPLANT
GLOVE BIOGEL PI IND STRL 7.5 (GLOVE) ×1 IMPLANT
GLOVE BIOGEL PI INDICATOR 6.5 (GLOVE) ×8
GLOVE BIOGEL PI INDICATOR 7.5 (GLOVE) ×2
GLOVE ECLIPSE 6.5 STRL STRAW (GLOVE) ×8 IMPLANT
GLOVE SURG SS PI 6.5 STRL IVOR (GLOVE) ×6 IMPLANT
GLOVE SURG SS PI 7.5 STRL IVOR (GLOVE) ×3 IMPLANT
GOWN STRL REUS W/ TWL LRG LVL3 (GOWN DISPOSABLE) ×2 IMPLANT
GOWN STRL REUS W/ TWL XL LVL3 (GOWN DISPOSABLE) ×1 IMPLANT
GOWN STRL REUS W/TWL LRG LVL3 (GOWN DISPOSABLE) ×15
GOWN STRL REUS W/TWL XL LVL3 (GOWN DISPOSABLE) ×3
GRAFT GORETEX STRT 7X10 (Vascular Products) ×2 IMPLANT
HEMOSTAT SNOW SURGICEL 2X4 (HEMOSTASIS) ×2 IMPLANT
KIT BASIN OR (CUSTOM PROCEDURE TRAY) ×3 IMPLANT
KIT ROOM TURNOVER OR (KITS) ×3 IMPLANT
LIQUID BAND (GAUZE/BANDAGES/DRESSINGS) ×3 IMPLANT
NS IRRIG 1000ML POUR BTL (IV SOLUTION) ×3 IMPLANT
PACK CV ACCESS (CUSTOM PROCEDURE TRAY) ×3 IMPLANT
PAD ARMBOARD 7.5X6 YLW CONV (MISCELLANEOUS) ×6 IMPLANT
SUT GORETEX 6.0 TT13 (SUTURE) ×4 IMPLANT
SUT PROLENE 5 0 C 1 24 (SUTURE) ×6 IMPLANT
SUT PROLENE 6 0 BV (SUTURE) ×3 IMPLANT
SUT VIC AB 2-0 CT1 27 (SUTURE) ×3
SUT VIC AB 2-0 CT1 TAPERPNT 27 (SUTURE) IMPLANT
SUT VIC AB 3-0 SH 27 (SUTURE) ×3
SUT VIC AB 3-0 SH 27X BRD (SUTURE) ×1 IMPLANT
SUT VICRYL 4-0 PS2 18IN ABS (SUTURE) ×2 IMPLANT
UNDERPAD 30X30 INCONTINENT (UNDERPADS AND DIAPERS) ×3 IMPLANT
WATER STERILE IRR 1000ML POUR (IV SOLUTION) ×3 IMPLANT

## 2015-08-18 NOTE — Anesthesia Postprocedure Evaluation (Signed)
  Anesthesia Post-op Note  Patient: [name]  Procedure(s) Performed: Procedure(s): THROMBECTOMY  AND REVISION ARTERIOVENOUS GORE-TEX GRAFT/LEFT THIGH (Left)  Patient Location: PACU  Anesthesia Type:General  Level of Consciousness: awake and alert   Airway and Oxygen Therapy: Patient Spontanous Breathing  Post-op Pain: Controlled  Post-op Assessment: Post-op Vital signs reviewed, Patient's Cardiovascular Status Stable and Respiratory Function Stable  Post-op Vital Signs: Reviewed  Filed Vitals:   08/18/15 1517  BP: 131/77  Pulse:   Temp: 36.6 C  Resp: 16    Complications: No apparent anesthesia complications

## 2015-08-18 NOTE — Anesthesia Procedure Notes (Signed)
Procedure Name: LMA Insertion Date/Time: 08/18/2015 1:19 PM Performed by: Scheryl Darter Pre-anesthesia Checklist: Patient identified, Emergency Drugs available, Suction available, Patient being monitored and Timeout performed Patient Re-evaluated:Patient Re-evaluated prior to inductionOxygen Delivery Method: Circle system utilized Preoxygenation: Pre-oxygenation with 100% oxygen Intubation Type: IV induction LMA: LMA inserted LMA Size: 5.0 Number of attempts: 2 Dental Injury: Teeth and Oropharynx as per pre-operative assessment

## 2015-08-18 NOTE — Op Note (Signed)
    Patient name: [name] MRN: [id number] DOB: Aug 14, 1971 Sex: male  08/18/2015 Pre-operative Diagnosis: occluded left thigh dialysis graft Post-operative diagnosis:  Same Surgeon:  [name] Assistants:  Silva Bandy Procedure:   #1:Thrombectomy and revision left thigh graft   #2: Redo left femoral exposure Anesthesia:  Gen. Blood Loss:  See anesthesia record Specimens:  none  Findings:  Venous anastomosis was moved to the common femoral vein and a end-to-side fashion  Indications:  The patient has had multiple  From a lysis procedures for his thigh graft.  Surgical correction is now recommended.  His graft was placed 4 years ago and has not been intervened on surgically.  Procedure:  The patient was identified in the holding area and taken to [facility] [facility]  The patient was then placed supine on the table. general anesthesia was administered.  The patient was prepped and draped in the usual sterile fashion.  A time out was called and antibiotics were administered. The patient's previous oblique incision was reopened with a #10 blade.using a combination of Bovie cautery and scissor dissection, the arterial and venous anastomoses were exposed.  The patient was fully heparinized.  I ligated the venous anastomosis after transecting the graft.  The defunctionalized limb was oversewn with 2 layers of 5-0 Prolene.  I then selected a 7 x 10 Gore-Tex graft and performed a end to end anastomosis between the old graft and the new graftusing CV 6 Gore-Tex suture..  I then used a #4 Fogarty catheter to perform thrombectomy of the graft.  Multiple passes were performed.  Ultimately all clot was removed.  There was a negative past 2.  There was excellent pulsatile flow through the graft.  I then reoccluded the graft.  I placed a Cooley J clamp on the common femoral vein.  A #11 blade was used to make a venotomy which was extended longitudinally with Potts scissors.  Graft was cut the appropriate  length a end to side anastomosis was then created with a running CV 6 Gore-Tex suture.   Prior to completion, the appropriate flushing maneuvers were performed and the anastomosis was completed.  There is excellent thrill within the graft.  50 mg of protamine was administered.  Once hemostasis was satisfactory, the incision was closed with multiple layers of 203 0 Vicryl followed by Dermabond.  There were no immediate complications.   Disposition:  To PACU in stable condition.   Theotis Burrow, M.D. [facility] Office: [phone] Pager:  [phone]

## 2015-08-18 NOTE — Anesthesia Preprocedure Evaluation (Addendum)
Anesthesia Evaluation  Patient identified by MRN, date of birth, ID band Patient awake    Reviewed: Allergy & Precautions, H&P , NPO status , Patient's Chart, lab work & pertinent test results  Airway Mallampati: II  TM Distance: >3 FB Neck ROM: Full    Dental no notable dental hx. (+) Teeth Intact, Dental Advisory Given   Pulmonary neg pulmonary ROS, former smoker,    Pulmonary exam normal breath sounds clear to auscultation       Cardiovascular hypertension, Pt. on medications  Rhythm:Regular Rate:Normal     Neuro/Psych  Headaches, Depression    GI/Hepatic negative GI ROS, Neg liver ROS,   Endo/Other  negative endocrine ROS  Renal/GU ESRF and DialysisRenal disease  negative genitourinary   Musculoskeletal   Abdominal   Peds  Hematology negative hematology ROS (+)   Anesthesia Other Findings   Reproductive/Obstetrics negative OB ROS                            Anesthesia Physical Anesthesia Plan  ASA: III  Anesthesia Plan: General   Post-op Pain Management:    Induction: Intravenous  Airway Management Planned: LMA  Additional Equipment:   Intra-op Plan:   Post-operative Plan: Extubation in OR  Informed Consent: I have reviewed the patients History and Physical, chart, labs and discussed the procedure including the risks, benefits and alternatives for the proposed anesthesia with the patient or authorized representative who has indicated his/her understanding and acceptance.   Dental advisory given  Plan Discussed with: CRNA  Anesthesia Plan Comments:         Anesthesia Quick Evaluation

## 2015-08-18 NOTE — Discharge Instructions (Signed)
° ° °  08/18/2015 Ercole Idema Mccannon [id number] 12-Jul-1971  Surgeon(s): [name], MD  Procedure(s): THROMBECTOMY  AND REVISION ARTERIOVENOUS GORE-TEX GRAFT/LEFT THIGH  x May stick graft on designated area only: Stick anywhere away from left groin incision.

## 2015-08-18 NOTE — Transfer of Care (Signed)
Immediate Anesthesia Transfer of Care Note  Patient: [name]  Procedure(s) Performed: Procedure(s): THROMBECTOMY  AND REVISION ARTERIOVENOUS GORE-TEX GRAFT/LEFT THIGH (Left)  Patient Location: PACU  Anesthesia Type:General  Level of Consciousness: awake, patient cooperative and lethargic  Airway & Oxygen Therapy: patient spontaneously breathing, room air  Post-op Assessment: Report given to RN, Post -op Vital signs reviewed and stable and Patient moving all extremities  Post vital signs: Reviewed and stable  Last Vitals:  BP 131/77 HR 77 RR 16 SpO2 98% Resting comfortably, maintains good airway, denies pain.  Complications: No apparent anesthesia complications

## 2015-08-18 NOTE — H&P (Signed)
Patient name: [name] MRN: [id number] DOB: 1971/08/09 Sex: male    No chief complaint on file.   HISTORY OF PRESENT ILLNESS: 44 year old male with ESRD who dialyses via a left thigh AVGG, placed 4 years ago.  He has had multiple delots recently, and now is in need of surgical revision  Past Medical History  Diagnosis Date  . Hypertension   . Anemia   . Thyroid disease   . Cough     DRY    . Headache(784.0)   . Muscle spasms of neck     BACK, NECK  . Depression   . ESRD on hemodialysis ([place])     HD [facility] MWF    Past Surgical History  Procedure Laterality Date  . Hemodialysis graft to left arm and left leg    . Parathyroidectomy N/A 04/24/2014    Procedure: TOTAL 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;  Surgeon: Anthony Regal, MD;  Location: [place];  Service: General;  Laterality: N/A;  NECK AND LEFT FOREARM    Social History   Social History  . Marital Status: Single    Spouse Name: N/A  . Number of Children: N/A  . Years of Education: N/A   Occupational History  . Not on file.   Social History Main Topics  . Smoking status: Former Smoker    Quit date: 03/21/1986  . Smokeless tobacco: Never Used  . Alcohol Use: No  . Drug Use: No  . Sexual Activity: Not on file   Other Topics Concern  . Not on file   Social History Narrative    History reviewed. No pertinent family history.  Allergies as of 08/18/2015 - Review Complete 08/18/2015  Allergen Reaction Noted  . Lisinopril Cough 03/03/2015    No current facility-administered medications on file prior to encounter.   Current Outpatient Prescriptions on File Prior to Encounter  Medication Sig Dispense Refill  . acetaminophen (TYLENOL) 500 MG tablet Take 1,000 mg by mouth every 6 (six) hours as needed for pain. For pain    . calcium carbonate (TUMS - DOSED IN MG ELEMENTAL CALCIUM) 500 MG chewable tablet Chew 2 tablets (400 mg of elemental calcium total) by mouth 2 (two)  times daily between meals. 120 tablet 3  . Multiple Vitamin (MULTIVITAMIN WITH MINERALS) TABS tablet Take 1 tablet by mouth daily.       REVIEW OF SYSTEMS: Cardiovascular: No chest pain, chest pressure, palpitations, orthopnea, or dyspnea on exertion. No claudication or rest pain,  No history of DVT or phlebitis. Pulmonary: No productive cough, asthma or wheezing. Neurologic: No weakness, paresthesias, aphasia, or amaurosis. No dizziness. Hematologic: No bleeding problems or clotting disorders. Musculoskeletal: No joint pain or joint swelling. Gastrointestinal: No blood in stool or hematemesis Genitourinary: No dysuria or hematuria. Psychiatric:: No history of major depression. Integumentary: No rashes or ulcers. Constitutional: No fever or chills.  PHYSICAL EXAMINATION:   Vital signs are  Filed Vitals:   08/18/15 1225 08/18/15 1230  BP: 146/104   Pulse: 84   Temp: 97.8 F (36.6 C)   Resp: 16   Weight:  160 lb 15 oz (73 kg)  SpO2: 100%    Body mass index is 21.82 kg/(m^2). General: The patient appears their stated age. HEENT:  No gross abnormalities Pulmonary:  Non labored breathing Abdomen: Soft and non-tender Musculoskeletal: There are no major deformities. Neurologic: No focal weakness or paresthesias are detected, Skin: There are no ulcer or rashes noted.  Psychiatric: The patient has normal affect. Cardiovascular: occluded left thigh AVGG  Diagnostic Studies I have reviewed his most recent shuntogram, which suggests recurrent venous outflow stenosis  Assessment: Clotted left thigh AVGG Plan: Discussed proceeding with thrombectomy and revision of his left thigh AVGG, possible catheter.  All questions answered.  He wishes to proceed.  [name], M.D. [facility] Office: [phone] Pager:  [phone]

## 2015-08-19 ENCOUNTER — Encounter: Payer: Self-pay | Admitting: *Deleted

## 2015-08-19 ENCOUNTER — Encounter (HOSPITAL_COMMUNITY): Payer: Self-pay | Admitting: General Practice

## 2015-08-19 LAB — BASIC METABOLIC PANEL
Anion gap: 18 — ABNORMAL HIGH (ref 5–15)
BUN: 67 mg/dL — AB (ref 6–20)
CALCIUM: 8.4 mg/dL — AB (ref 8.9–10.3)
CO2: 24 mmol/L (ref 22–32)
Chloride: 95 mmol/L — ABNORMAL LOW (ref 101–111)
Creatinine, Ser: 16.4 mg/dL — ABNORMAL HIGH (ref 0.61–1.24)
GFR calc Af Amer: 4 mL/min — ABNORMAL LOW (ref >60)
GFR, EST NON AFRICAN AMERICAN: 3 mL/min — AB (ref >60)
GLUCOSE: 162 mg/dL — AB (ref 65–99)
Potassium: 5.2 mmol/L — ABNORMAL HIGH (ref 3.5–5.1)
SODIUM: 137 mmol/L (ref 135–145)

## 2015-08-19 LAB — CBC
HCT: 33.8 % — ABNORMAL LOW (ref 39.0–52.0)
Hemoglobin: 11.1 g/dL — ABNORMAL LOW (ref 13.0–17.0)
MCH: 32.1 pg (ref 26.0–34.0)
MCHC: 32.8 g/dL (ref 30.0–36.0)
MCV: 97.7 fL (ref 78.0–100.0)
PLATELETS: 182 10*3/uL (ref 150–400)
RBC: 3.46 MIL/uL — AB (ref 4.22–5.81)
RDW: 14.3 % (ref 11.5–15.5)
WBC: 6.9 10*3/uL (ref 4.0–10.5)

## 2015-08-19 NOTE — Progress Notes (Signed)
Tx completed at 1121.  Pt assessment unchanged.  Needles removed and pressure held for 10 minutes.  Bandaids placed over insertion sites.   Goal of 3700, Net of 2.5L.  Report called to Marcie Bal, RN.

## 2015-08-19 NOTE — Consult Note (Addendum)
[facility] Renal Consultation Note    Indication for Consultation:  Management of ESRD/hemodialysis; anemia, hypertension/volume and secondary hyperparathyroidism PCP:  HPI: [name] is a 44 y.o. male with end stage renal disease who has hemodialysis at [facility] on MWF. Past medical history significant for hypertension, hepatitis B, anemia, thyroid disease, depression. He has L thigh AVG placed approximately 4 years ago and has recently required multiple declots. Was admitted by Dr. Trula Slade yesterday for declot and revision of L thigh graft. Patient had transportation issues and was unable to get to his hemodialysis center on time for his hemodialysis today. He is currently having his hemodialysis treatment and will be discharged after treatments. No C/O pain, SOB, fever, chills, malaise, N,V,D, no dizziness, HA, tinnitus, no tarry stools, hematochezia, abdominal pain, flank pain.  Has hemodialysis at [facility]. Has history of early sign offs. Uses TUMS for phosphate binders.   Past Medical History  Diagnosis Date  . Hypertension   . Anemia   . Thyroid disease   . Cough     DRY    . Headache(784.0)   . Muscle spasms of neck     BACK, NECK  . Depression   . ESRD on hemodialysis ([place])     HD [facility] MWF   Past Surgical History  Procedure Laterality Date  . Arteriovenous graft placement Left     "forearm, it's not working; thigh"   . Parathyroidectomy N/A 04/24/2014    Procedure: TOTAL 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;  Surgeon: [name], MD;  Location: [place];  Service: General;  Laterality: N/A;  NECK AND LEFT FOREARM  . Thrombectomy / arteriovenous graft revision Left 08/18/2015    thigh   History reviewed. No pertinent family history. Social History:  reports that he quit smoking about 29 years ago. He has never used smokeless tobacco. He reports that he does not drink alcohol or use illicit  drugs. Allergies  Allergen Reactions  . Lisinopril Cough   Prior to Admission medications   Medication Sig Start Date End Date Taking? Authorizing Provider  acetaminophen (TYLENOL) 500 MG tablet Take 1,000 mg by mouth every 6 (six) hours as needed for pain. For pain   Yes Historical Provider, MD  calcium carbonate (TUMS - DOSED IN MG ELEMENTAL CALCIUM) 500 MG chewable tablet Chew 2 tablets (400 mg of elemental calcium total) by mouth 2 (two) times daily between meals. 04/26/14  Yes Judeth Horn, MD  Multiple Vitamin (MULTIVITAMIN WITH MINERALS) TABS tablet Take 1 tablet by mouth daily.   Yes Historical Provider, MD  oxyCODONE-acetaminophen (ROXICET) 5-325 MG tablet Take 1-2 tablets by mouth every 4 (four) hours as needed for moderate pain. 08/18/15   Alvia Grove, PA-C   Current Facility-Administered Medications  Medication Dose Route Frequency Provider Last Rate Last Dose  . 0.9 %  sodium chloride infusion  500 mL Intravenous Once PRN Alvia Grove, PA-C      . 0.9 %  sodium chloride infusion   Intravenous Continuous Alvia Grove, PA-C 10 mL/hr at 08/18/15 1750    . acetaminophen (TYLENOL) tablet 325-650 mg  325-650 mg Oral Q4H PRN Alvia Grove, PA-C       Or  . acetaminophen (TYLENOL) suppository 325-650 mg  325-650 mg Rectal Q4H PRN Alvia Grove, PA-C      . alum & mag hydroxide-simeth (MAALOX/MYLANTA) 200-200-20 MG/5ML suspension 15-30 mL  15-30 mL Oral Q2H PRN Alvia Grove, PA-C      .  calcium carbonate (TUMS - dosed in mg elemental calcium) chewable tablet 400 mg of elemental calcium  2 tablet Oral BID BM Alvia Grove, PA-C      . cefUROXime (ZINACEF) 1.5 g in dextrose 5 % 50 mL IVPB  1.5 g Intravenous Q12H Kimberly A Trinh, PA-C      . docusate sodium (COLACE) capsule 100 mg  100 mg Oral Daily Kimberly A Trinh, PA-C      . guaiFENesin-dextromethorphan (ROBITUSSIN DM) 100-10 MG/5ML syrup 15 mL  15 mL Oral Q4H PRN Alvia Grove, PA-C      . heparin injection  5,000 Units  5,000 Units Subcutaneous 3 times per day Alvia Grove, PA-C   5,000 Units at 08/19/15 0532  . hydrALAZINE (APRESOLINE) injection 5 mg  5 mg Intravenous Q20 Min PRN Alvia Grove, PA-C      . labetalol (NORMODYNE,TRANDATE) injection 10 mg  10 mg Intravenous Q10 min PRN Alvia Grove, PA-C      . metoprolol (LOPRESSOR) injection 2-5 mg  2-5 mg Intravenous Q2H PRN Alvia Grove, PA-C      . morphine 2 MG/ML injection 2-5 mg  2-5 mg Intravenous Q1H PRN Alvia Grove, PA-C      . multivitamin with minerals tablet 1 tablet  1 tablet Oral Daily Alvia Grove, PA-C      . ondansetron (ZOFRAN) injection 4 mg  4 mg Intravenous Q6H PRN Alvia Grove, PA-C      . oxyCODONE-acetaminophen (PERCOCET/ROXICET) 5-325 MG per tablet 1-2 tablet  1-2 tablet Oral Q4H PRN Alvia Grove, PA-C      . pantoprazole (PROTONIX) EC tablet 40 mg  40 mg Oral Daily Alvia Grove, PA-C   40 mg at 08/18/15 1749  . phenol (CHLORASEPTIC) mouth spray 1 spray  1 spray Mouth/Throat PRN Alvia Grove, PA-C       Labs: Basic Metabolic Panel:  Recent Labs Lab 08/18/15 1237 08/18/15 1301 08/18/15 1731 08/19/15 0001  NA 132* 139  --  137  K >8.5* 4.3  --  5.2*  CL  --   --   --  95*  CO2  --   --   --  24  GLUCOSE 87 76  --  162*  BUN  --   --   --  67*  CREATININE  --   --  15.53* 16.40*  CALCIUM  --   --   --  8.4*   Liver Function Tests: No results for input(s): AST, ALT, ALKPHOS, BILITOT, PROT, ALBUMIN in the last 168 hours. No results for input(s): LIPASE, AMYLASE in the last 168 hours. No results for input(s): AMMONIA in the last 168 hours. CBC:  Recent Labs Lab 08/18/15 1301 08/18/15 1731 08/19/15 0001  WBC  --  8.6 6.9  HGB 12.2* 11.1* 11.1*  HCT 36.0* 34.5* 33.8*  MCV  --  98.9 97.7  PLT  --  178 182   Cardiac Enzymes: No results for input(s): CKTOTAL, CKMB, CKMBINDEX, TROPONINI in the last 168 hours. CBG: No results for input(s): GLUCAP in the last 168  hours. Iron Studies: No results for input(s): IRON, TIBC, TRANSFERRIN, FERRITIN in the last 72 hours. Studies/Results: No results found.  ROS: As per HPI otherwise negative.   Physical Exam: Filed Vitals:   08/19/15 0702 08/19/15 0721 08/19/15 0736 08/19/15 0751  BP: 126/90 143/89 128/82 128/84  Pulse: 73 79 70 93  Temp: 97.2 F (36.2 C)  TempSrc: Oral     Resp: 14     Weight: 78.6 kg (173 lb 4.5 oz)     SpO2:         General: Well developed, well nourished, in no acute distress. Head: Normocephalic, atraumatic, sclera non-icteric, mucus membranes are moist Neck: Supple. JVD not elevated. Lungs: Clear bilaterally to auscultation without wheezes, rales, or rhonchi. Breathing is unlabored. Heart: RRR with S1 S2. No murmurs, rubs, or gallops appreciated. Abdomen: Soft, non-tender, non-distended with normoactive bowel sounds. No rebound/guarding. No obvious abdominal masses. M-S:  Strength and tone appear normal for age. Lower extremities:without edema or ischemic changes, no open wounds  Neuro: Alert and oriented X 3. Moves all extremities spontaneously. Psych:  Responds to questions appropriately with a normal affect. Dialysis Access:  Dialysis Orders: Center: [facility]  on MWF . Hemodialysis [facility], MonWedFri, 3 hrs 45 min, 180NRe Optiflux, BFR 400, DFR Manual 800 mL/min, EDW 73 (kg), Dialysate 2.0 K, 2.5 Ca UFR Profile: Profile 4, Sodium Model: Linear, Access: AV Graft-Standard  Heparin: 1500 units per treatment  Assessment/Plan: 1.  Declot/Revision of L. Thigh Graft: Per Dr. Trula Slade. For DC after HD. The graft is 43 yrs old and this was first surgical intervention.  2.  ESRD -  MWF at [facility]  3.  Hypertension/volume  - BP is volume controlled. Pre wt 78.6 .UFG 2.5 liters. No OP antihypertensive medications.  4.  Anemia  - HGB 11.1. Not on ESA. 5.  Metabolic bone disease -  Ca 8.4. Uses TUMS for phosphate controll.  6.  Nutrition - Renal diet.   Rita H. Owens Shark,  NP-C 08/19/2015, 8:26 AM  [employer] [phone]  Pt seen, examined and agree w A/P as above. ESRD patient w multiple clotting episodes regarding L thigh AVG. Admitted for thrombectomy and revision done yesterday by [facility] team. For HD this am using the AVG, likely dc later today. Kelly Splinter MD [employer] pager [phone]    cell [phone] 08/19/2015, 12:26 PM

## 2015-08-19 NOTE — Progress Notes (Signed)
  [facility] Progress Note  Subjective  - POD #1  Patient seen in HD. Soreness with left groin incision.   Objective Filed Vitals:   08/19/15 0906  BP: 120/80  Pulse: 82  Temp:   Resp:     Intake/Output Summary (Last 24 hours) at 08/19/15 0917 Last data filed at 08/18/15 1800  Gross per 24 hour  Intake    490 ml  Output    100 ml  Net    390 ml    Left groin oblique incision clean dry and intact.  Left AVG currently cannulated for HD.   Assessment/Planning: 44 y.o. male is s/p: thrombectomy and revision of left thigh AV graft and redo left femoral exposure 1 Day Post-Op   Incision healing well. AVG currently in use with HD without complications. Discharge after HD. Follow-up with Dr. Trula Slade as needed.  DVT prophylaxis: SQ heparin  Alvia Grove 08/19/2015 9:17 AM --  Laboratory CBC    Component Value Date/Time   WBC 6.9 08/19/2015 0001   HGB 11.1* 08/19/2015 0001   HCT 33.8* 08/19/2015 0001   PLT 182 08/19/2015 0001    BMET    Component Value Date/Time   NA 137 08/19/2015 0001   K 5.2* 08/19/2015 0001   CL 95* 08/19/2015 0001   CO2 24 08/19/2015 0001   GLUCOSE 162* 08/19/2015 0001   BUN 67* 08/19/2015 0001   CREATININE 16.40* 08/19/2015 0001   CALCIUM 8.4* 08/19/2015 0001   CALCIUM 8.7 02/16/2010 0827   GFRNONAA 3* 08/19/2015 0001   GFRAA 4* 08/19/2015 0001    COAG Lab Results  Component Value Date   INR 1.05 07/27/2015   INR 1.09 02/12/2010   No results found for: PTT  Antibiotics Anti-infectives    Start     Dose/Rate Route Frequency Ordered Stop   08/19/15 1300  cefUROXime (ZINACEF) 1.5 g in dextrose 5 % 50 mL IVPB     1.5 g 100 mL/hr over 30 Minutes Intravenous Every 12 hours 08/18/15 1707 08/20/15 0059   08/18/15 1330  cefUROXime (ZINACEF) 1.5 g in dextrose 5 % 50 mL IVPB  Status:  Discontinued     1.5 g 100 mL/hr over 30 Minutes Intravenous To Surgery 08/18/15 1317 08/18/15 1639   08/18/15 1251   cefUROXime (ZINACEF) 1.5 g in dextrose 5 % 50 mL IVPB     1.5 g 100 mL/hr over 30 Minutes Intravenous 30 min pre-op 08/18/15 1251 08/18/15 1322       Virgina Jock, PA-C [facility] Office: [phone] Pager: [phone] 08/19/2015 9:17 AM

## 2015-08-19 NOTE — Progress Notes (Signed)
Pt complained of pain at left groin incision site. Pain was relieved with tylenol. I reviewed discharge instructions with pt. He verbalized understanding of follow up appointments after discharge and monitoring of incision site for symptoms of infection. He was also instructed to avoid heavy lifting for two weeks. He was discharged to home at 1650.

## 2015-08-19 NOTE — Progress Notes (Signed)
Pt arrived to unit by bed at 0702.  Consent obtained per this RN.  Report received from bedside RN.   Pt A & O X 4 Lungs clear to auscultation. No edema appreciated. Bruit, thrill noted at access site.  Site prepped with alcohol and LTAVG access with 15 gauge needles.  Pulsation of blood noted.  Tx initiated at 0721 with goal of 3200 mL and net of 2L.  Will continue to monitor.

## 2015-08-23 ENCOUNTER — Encounter (HOSPITAL_COMMUNITY): Payer: Self-pay | Admitting: Certified Registered Nurse Anesthetist

## 2015-08-23 ENCOUNTER — Ambulatory Visit (HOSPITAL_COMMUNITY)
Admission: EM | Admit: 2015-08-23 | Discharge: 2015-08-23 | Disposition: A | Discharge status: Home / Self Care | Payer: Medicare Other | Source: Ambulatory Visit | Attending: Vascular Surgery | Admitting: Vascular Surgery

## 2015-08-23 ENCOUNTER — Encounter (HOSPITAL_COMMUNITY)
Admission: EM | Disposition: A | Discharge status: Home / Self Care | Payer: Self-pay | Source: Ambulatory Visit | Attending: Vascular Surgery

## 2015-08-23 ENCOUNTER — Ambulatory Visit (HOSPITAL_COMMUNITY): Payer: Medicare Other | Admitting: Certified Registered Nurse Anesthetist

## 2015-08-23 DIAGNOSIS — I120 Hypertensive chronic kidney disease with stage 5 chronic kidney disease or end stage renal disease: Secondary | ICD-10-CM | POA: Insufficient documentation

## 2015-08-23 DIAGNOSIS — Z992 Dependence on renal dialysis: Secondary | ICD-10-CM | POA: Insufficient documentation

## 2015-08-23 DIAGNOSIS — T82868A Thrombosis of vascular prosthetic devices, implants and grafts, initial encounter: Secondary | ICD-10-CM | POA: Insufficient documentation

## 2015-08-23 DIAGNOSIS — Y832 Surgical operation with anastomosis, bypass or graft as the cause of abnormal reaction of the patient, or of later complication, without mention of misadventure at the time of the procedure: Secondary | ICD-10-CM | POA: Insufficient documentation

## 2015-08-23 DIAGNOSIS — Z87891 Personal history of nicotine dependence: Secondary | ICD-10-CM | POA: Insufficient documentation

## 2015-08-23 DIAGNOSIS — E079 Disorder of thyroid, unspecified: Secondary | ICD-10-CM | POA: Insufficient documentation

## 2015-08-23 DIAGNOSIS — T82868S Thrombosis of vascular prosthetic devices, implants and grafts, sequela: Secondary | ICD-10-CM | POA: Diagnosis not present

## 2015-08-23 DIAGNOSIS — F329 Major depressive disorder, single episode, unspecified: Secondary | ICD-10-CM | POA: Insufficient documentation

## 2015-08-23 DIAGNOSIS — N186 End stage renal disease: Secondary | ICD-10-CM | POA: Insufficient documentation

## 2015-08-23 HISTORY — PX: THROMBECTOMY AND REVISION OF ARTERIOVENTOUS (AV) GORETEX  GRAFT: SHX6120

## 2015-08-23 LAB — POCT I-STAT 4, (NA,K, GLUC, HGB,HCT)
Glucose, Bld: 82 mg/dL (ref 65–99)
HEMATOCRIT: 39 % (ref 39.0–52.0)
HEMOGLOBIN: 13.3 g/dL (ref 13.0–17.0)
Potassium: 5.3 mmol/L — ABNORMAL HIGH (ref 3.5–5.1)
SODIUM: 135 mmol/L (ref 135–145)

## 2015-08-23 SURGERY — THROMBECTOMY AND REVISION OF ARTERIOVENTOUS (AV) GORETEX  GRAFT
Anesthesia: General | Laterality: Left

## 2015-08-23 MED ORDER — PROPOFOL 10 MG/ML IV BOLUS
INTRAVENOUS | Status: AC
  Filled 2015-08-23: qty 20

## 2015-08-23 MED ORDER — CEFAZOLIN SODIUM-DEXTROSE 2-3 GM-% IV SOLR
INTRAVENOUS | Status: DC | PRN
  Administered 2015-08-23: 2 g via INTRAVENOUS

## 2015-08-23 MED ORDER — LIDOCAINE HCL (CARDIAC) 20 MG/ML IV SOLN
INTRAVENOUS | Status: DC | PRN
  Administered 2015-08-23: 7 mg via INTRAVENOUS

## 2015-08-23 MED ORDER — DIPHENHYDRAMINE HCL 50 MG/ML IJ SOLN
INTRAMUSCULAR | Status: DC | PRN
  Administered 2015-08-23: 25 mg via INTRAVENOUS

## 2015-08-23 MED ORDER — THROMBIN 20000 UNITS EX SOLR
CUTANEOUS | Status: DC | PRN
  Administered 2015-08-23: 20 mL via TOPICAL

## 2015-08-23 MED ORDER — PHENYLEPHRINE HCL 10 MG/ML IJ SOLN
INTRAMUSCULAR | Status: DC | PRN
  Administered 2015-08-23: 80 ug via INTRAVENOUS

## 2015-08-23 MED ORDER — HEPARIN SODIUM (PORCINE) 1000 UNIT/ML IJ SOLN
INTRAMUSCULAR | Status: DC | PRN
  Administered 2015-08-23: 7000 [IU] via INTRAVENOUS

## 2015-08-23 MED ORDER — HYDROMORPHONE HCL 1 MG/ML IJ SOLN
INTRAMUSCULAR | Status: AC
  Administered 2015-08-23: 0.5 mg via INTRAVENOUS
  Filled 2015-08-23: qty 2

## 2015-08-23 MED ORDER — MEPERIDINE HCL 25 MG/ML IJ SOLN: 6.2500 mg | INTRAMUSCULAR | Status: DC | PRN

## 2015-08-23 MED ORDER — THROMBIN 20000 UNITS EX SOLR
CUTANEOUS | Status: AC
  Filled 2015-08-23: qty 20000

## 2015-08-23 MED ORDER — OXYCODONE-ACETAMINOPHEN 5-325 MG PO TABS: 1.0000 | ORAL_TABLET | ORAL | Status: DC | PRN

## 2015-08-23 MED ORDER — SODIUM CHLORIDE 0.9 % IV SOLN
INTRAVENOUS | Status: DC | PRN
  Administered 2015-08-23 (×2): via INTRAVENOUS

## 2015-08-23 MED ORDER — OXYCODONE HCL 5 MG PO TABS
ORAL_TABLET | ORAL | Status: AC
  Filled 2015-08-23: qty 1

## 2015-08-23 MED ORDER — SODIUM CHLORIDE 0.9 % IV SOLN
INTRAVENOUS | Status: DC | PRN
  Administered 2015-08-23: 500 mL

## 2015-08-23 MED ORDER — FENTANYL CITRATE (PF) 250 MCG/5ML IJ SOLN
INTRAMUSCULAR | Status: AC
  Filled 2015-08-23: qty 5

## 2015-08-23 MED ORDER — ONDANSETRON HCL 4 MG/2ML IJ SOLN
INTRAMUSCULAR | Status: DC | PRN
  Administered 2015-08-23: 4 mg via INTRAVENOUS

## 2015-08-23 MED ORDER — 0.9 % SODIUM CHLORIDE (POUR BTL) OPTIME
TOPICAL | Status: DC | PRN
  Administered 2015-08-23: 1000 mL

## 2015-08-23 MED ORDER — HYDROMORPHONE HCL 1 MG/ML IJ SOLN
0.2500 mg | INTRAMUSCULAR | Status: DC | PRN
  Administered 2015-08-23 (×4): 0.5 mg via INTRAVENOUS

## 2015-08-23 MED ORDER — OXYCODONE HCL 5 MG/5ML PO SOLN: 5.0000 mg | Freq: Once | ORAL | Status: AC | PRN

## 2015-08-23 MED ORDER — DEXAMETHASONE SODIUM PHOSPHATE 4 MG/ML IJ SOLN
INTRAMUSCULAR | Status: DC | PRN
  Administered 2015-08-23: 4 mg via INTRAVENOUS

## 2015-08-23 MED ORDER — PROTAMINE SULFATE 10 MG/ML IV SOLN
INTRAVENOUS | Status: DC | PRN
  Administered 2015-08-23 (×4): 10 mg via INTRAVENOUS

## 2015-08-23 MED ORDER — OXYCODONE HCL 5 MG PO TABS
5.0000 mg | ORAL_TABLET | Freq: Once | ORAL | Status: AC | PRN
  Administered 2015-08-23: 5 mg via ORAL

## 2015-08-23 MED ORDER — FENTANYL CITRATE (PF) 100 MCG/2ML IJ SOLN
INTRAMUSCULAR | Status: DC | PRN
  Administered 2015-08-23: 50 ug via INTRAVENOUS
  Administered 2015-08-23 (×2): 25 ug via INTRAVENOUS
  Administered 2015-08-23: 50 ug via INTRAVENOUS

## 2015-08-23 SURGICAL SUPPLY — 72 items
ADH SKN CLS APL DERMABOND .7 (GAUZE/BANDAGES/DRESSINGS) ×1
ARMBAND PINK RESTRICT EXTREMIT (MISCELLANEOUS) ×1 IMPLANT
BAG DECANTER FOR FLEXI CONT (MISCELLANEOUS) ×1 IMPLANT
BIOPATCH RED 1 DISK 7.0 (GAUZE/BANDAGES/DRESSINGS) ×1 IMPLANT
BIOPATCH RED 1IN DISK 7.0MM (GAUZE/BANDAGES/DRESSINGS)
CANISTER SUCTION 2500CC (MISCELLANEOUS) ×3 IMPLANT
CANNULA VESSEL 3MM 2 BLNT TIP (CANNULA) ×2 IMPLANT
CATH CANNON HEMO 15F 50CM (CATHETERS) IMPLANT
CATH CANNON HEMO 15FR 19 (HEMODIALYSIS SUPPLIES) IMPLANT
CATH CANNON HEMO 15FR 23CM (HEMODIALYSIS SUPPLIES) IMPLANT
CATH CANNON HEMO 15FR 31CM (HEMODIALYSIS SUPPLIES) IMPLANT
CATH CANNON HEMO 15FR 32 (HEMODIALYSIS SUPPLIES) IMPLANT
CATH CANNON HEMO 15FR 32CM (HEMODIALYSIS SUPPLIES) IMPLANT
CATH EMB 4FR 80CM (CATHETERS) ×3 IMPLANT
CHLORAPREP W/TINT 26ML (MISCELLANEOUS) ×1 IMPLANT
CLIP TI MEDIUM 6 (CLIP) ×3 IMPLANT
CLIP TI WIDE RED SMALL 6 (CLIP) ×3 IMPLANT
COVER PROBE W GEL 5X96 (DRAPES) IMPLANT
DERMABOND ADVANCED (GAUZE/BANDAGES/DRESSINGS) ×2
DERMABOND ADVANCED .7 DNX12 (GAUZE/BANDAGES/DRESSINGS) IMPLANT
DRAPE C-ARM 42X72 X-RAY (DRAPES) ×1 IMPLANT
DRAPE CHEST BREAST 15X10 FENES (DRAPES) ×1 IMPLANT
DRAPE X-RAY CASS 24X20 (DRAPES) IMPLANT
ELECT REM PT RETURN 9FT ADLT (ELECTROSURGICAL)
ELECTRODE REM PT RTRN 9FT ADLT (ELECTROSURGICAL) ×1 IMPLANT
GAUZE SPONGE 2X2 8PLY STRL LF (GAUZE/BANDAGES/DRESSINGS) ×1 IMPLANT
GAUZE SPONGE 4X4 16PLY XRAY LF (GAUZE/BANDAGES/DRESSINGS) ×1 IMPLANT
GEL ULTRASOUND 20GR AQUASONIC (MISCELLANEOUS) IMPLANT
GLOVE BIO SURGEON STRL SZ 6.5 (GLOVE) ×2 IMPLANT
GLOVE BIO SURGEON STRL SZ7.5 (GLOVE) ×3 IMPLANT
GLOVE BIO SURGEONS STRL SZ 6.5 (GLOVE) ×2
GLOVE BIOGEL PI IND STRL 6.5 (GLOVE) IMPLANT
GLOVE BIOGEL PI IND STRL 8 (GLOVE) ×1 IMPLANT
GLOVE BIOGEL PI INDICATOR 6.5 (GLOVE) ×2
GLOVE BIOGEL PI INDICATOR 8 (GLOVE) ×2
GLOVE SURG SS PI 7.5 STRL IVOR (GLOVE) ×2 IMPLANT
GOWN STRL REUS W/ TWL LRG LVL3 (GOWN DISPOSABLE) ×3 IMPLANT
GOWN STRL REUS W/TWL LRG LVL3 (GOWN DISPOSABLE) ×9
GRAFT GORETEX STRT 4-7X45 (Vascular Products) ×2 IMPLANT
KIT BASIN OR (CUSTOM PROCEDURE TRAY) ×3 IMPLANT
KIT ROOM TURNOVER OR (KITS) ×3 IMPLANT
LIQUID BAND (GAUZE/BANDAGES/DRESSINGS) ×3 IMPLANT
NDL 18GX1X1/2 (RX/OR ONLY) (NEEDLE) ×1 IMPLANT
NDL HYPO 25GX1X1/2 BEV (NEEDLE) ×1 IMPLANT
NEEDLE 18GX1X1/2 (RX/OR ONLY) (NEEDLE) IMPLANT
NEEDLE 22X1 1/2 (OR ONLY) (NEEDLE) ×3 IMPLANT
NEEDLE HYPO 25GX1X1/2 BEV (NEEDLE) IMPLANT
NS IRRIG 1000ML POUR BTL (IV SOLUTION) ×3 IMPLANT
PACK CV ACCESS (CUSTOM PROCEDURE TRAY) ×3 IMPLANT
PACK SURGICAL SETUP 50X90 (CUSTOM PROCEDURE TRAY) ×1 IMPLANT
PAD ARMBOARD 7.5X6 YLW CONV (MISCELLANEOUS) ×6 IMPLANT
SET COLLECT BLD 21X3/4 12 (NEEDLE) IMPLANT
SPONGE GAUZE 2X2 STER 10/PKG (GAUZE/BANDAGES/DRESSINGS)
SPONGE SURGIFOAM ABS GEL 100 (HEMOSTASIS) ×2 IMPLANT
STOPCOCK 4 WAY LG BORE MALE ST (IV SETS) IMPLANT
SUCTION FRAZIER TIP 10 FR DISP (SUCTIONS) ×2 IMPLANT
SUT ETHILON 3 0 PS 1 (SUTURE) ×1 IMPLANT
SUT PROLENE 5 0 C 1 24 (SUTURE) ×4 IMPLANT
SUT PROLENE 6 0 BV (SUTURE) ×7 IMPLANT
SUT VIC AB 2-0 CT1 27 (SUTURE) ×3
SUT VIC AB 2-0 CT1 TAPERPNT 27 (SUTURE) IMPLANT
SUT VIC AB 3-0 SH 27 (SUTURE) ×9
SUT VIC AB 3-0 SH 27X BRD (SUTURE) ×1 IMPLANT
SUT VICRYL 4-0 PS2 18IN ABS (SUTURE) ×7 IMPLANT
SYR 20CC LL (SYRINGE) ×2 IMPLANT
SYR 5ML LL (SYRINGE) ×2 IMPLANT
SYR CONTROL 10ML LL (SYRINGE) ×3 IMPLANT
SYRINGE 10CC LL (SYRINGE) ×1 IMPLANT
TOWEL NATURAL 6PK STERILE (DISPOSABLE) ×2 IMPLANT
TUBING EXTENTION W/L.L. (IV SETS) IMPLANT
UNDERPAD 30X30 INCONTINENT (UNDERPADS AND DIAPERS) ×3 IMPLANT
WATER STERILE IRR 1000ML POUR (IV SOLUTION) ×3 IMPLANT

## 2015-08-23 NOTE — Anesthesia Preprocedure Evaluation (Addendum)
Anesthesia Evaluation  Patient identified by MRN, date of birth, ID band Patient awake    Reviewed: Allergy & Precautions, H&P , NPO status , Patient's Chart, lab work & pertinent test results  Airway Mallampati: II  TM Distance: >3 FB Neck ROM: Full    Dental no notable dental hx. (+) Teeth Intact, Dental Advisory Given   Pulmonary pneumonia, resolved, former smoker,    Pulmonary exam normal breath sounds clear to auscultation       Cardiovascular hypertension, Pt. on medications  Rhythm:Regular Rate:Normal     Neuro/Psych  Headaches, PSYCHIATRIC DISORDERS Depression  C/o hx of muscle spasm of back and neck  Neuromuscular disease    GI/Hepatic negative GI ROS, Neg liver ROS,   Endo/Other  negative endocrine ROS  Renal/GU ESRF and DialysisRenal diseaseDialysis MWF  negative genitourinary   Musculoskeletal   Abdominal   Peds  Hematology negative hematology ROS (+) Blood dyscrasia, anemia ,   Anesthesia Other Findings   Reproductive/Obstetrics negative OB ROS                            CBC Latest Ref Rng 08/19/2015 08/18/2015 08/18/2015  WBC 4.0 - 10.5 K/uL 6.9 8.6 -  Hemoglobin 13.0 - 17.0 g/dL 11.1(L) 11.1(L) 12.2(L)  Hematocrit 39.0 - 52.0 % 33.8(L) 34.5(L) 36.0(L)  Platelets 150 - 400 K/uL 182 178 -   Lab Results  Component Value Date   CREATININE 16.40* 08/19/2015     Chemistry      Component Value Date/Time   NA 137 08/19/2015 0001   K 5.2* 08/19/2015 0001   CL 95* 08/19/2015 0001   CO2 24 08/19/2015 0001   BUN 67* 08/19/2015 0001   CREATININE 16.40* 08/19/2015 0001      Component Value Date/Time   CALCIUM 8.4* 08/19/2015 0001   CALCIUM 8.7 02/16/2010 0827   ALKPHOS 52 12/13/2014 1718   AST 35 12/13/2014 1718   ALT 22 12/13/2014 1718   BILITOT 1.0 12/13/2014 1718     BP Readings from Last 3 Encounters:  08/19/15 107/68  07/27/15 136/86  05/22/15 131/95   EKG  08/18/15: Normal sinus rhythm Possible Left atrial enlargement  Anesthesia Physical  Anesthesia Plan  ASA: III  Anesthesia Plan: General   Post-op Pain Management:    Induction: Intravenous  Airway Management Planned: LMA  Additional Equipment:   Intra-op Plan:   Post-operative Plan: Extubation in OR  Informed Consent: I have reviewed the patients History and Physical, chart, labs and discussed the procedure including the risks, benefits and alternatives for the proposed anesthesia with the patient or authorized representative who has indicated his/her understanding and acceptance.   Dental advisory given  Plan Discussed with: CRNA, Anesthesiologist and Surgeon  Anesthesia Plan Comments:         Anesthesia Quick Evaluation

## 2015-08-23 NOTE — Transfer of Care (Signed)
Immediate Anesthesia Transfer of Care Note  Patient: [name]  Procedure(s) Performed: Procedure(s): THROMBECTOMY AND REVISION OF ARTERIOVENTOUS (AV) GORETEX  GRAFT LEFT THIGH  (Left)  Patient Location: PACU  Anesthesia Type:General  Level of Consciousness: awake and alert   Airway & Oxygen Therapy: Patient Spontanous Breathing and Patient connected to nasal cannula oxygen  Post-op Assessment: Report given to RN  Post vital signs: Reviewed and stable  Last Vitals: There were no vitals filed for this visit.  Complications: No apparent anesthesia complications

## 2015-08-23 NOTE — Anesthesia Postprocedure Evaluation (Signed)
Anesthesia Post Note  Patient: [name]  Procedure(s) Performed: Procedure(s) (LRB): THROMBECTOMY AND REVISION OF ARTERIOVENTOUS (AV) GORETEX  GRAFT LEFT THIGH  (Left)  Anesthesia Post Evaluation  Last Vitals:  Filed Vitals:   08/23/15 1112 08/23/15 1127  BP: 99/66 97/80  Pulse: 88 95  Temp:    Resp: 20 16    Last Pain:  Filed Vitals:   08/23/15 1137  PainSc: 8                  Lavonna Rua M

## 2015-08-23 NOTE — Addendum Note (Signed)
Addendum  created 08/23/15 1618 by Eligha Bridegroom, CRNA   Modules edited: Anesthesia Medication Administration

## 2015-08-23 NOTE — Anesthesia Procedure Notes (Signed)
Procedure Name: LMA Insertion Date/Time: 08/23/2015 9:07 AM Performed by: Eligha Bridegroom Pre-anesthesia Checklist: Emergency Drugs available, Timeout performed, Patient identified, Patient being monitored and Suction available Patient Re-evaluated:Patient Re-evaluated prior to inductionOxygen Delivery Method: Circle system utilized Preoxygenation: Pre-oxygenation with 100% oxygen Intubation Type: IV induction Ventilation: Mask ventilation without difficulty LMA: LMA inserted LMA Size: 5.0 Number of attempts: 1 Tube secured with: Tape Dental Injury: Teeth and Oropharynx as per pre-operative assessment

## 2015-08-23 NOTE — Discharge Instructions (Signed)
° ° °  08/23/2015 Kaleal Bamba Stenerson [id number] Jun 02, 1971  Surgeon(s): [name], MD  Procedure(s): THROMBECTOMY AND REVISION OF ARTERIOVENTOUS (AV) GORETEX  GRAFT LEFT THIGH   x May stick graft on designated area only:   Do NOT stick lateral graft between incisions for 4 weeks.    Only stick medial side of graft between incisions for 4 weeks.  SEE DIAGRAM!

## 2015-08-23 NOTE — Anesthesia Postprocedure Evaluation (Signed)
  Anesthesia Post-op Note  Patient: [name]  Procedure(s) Performed: Procedure(s): THROMBECTOMY AND REVISION OF ARTERIOVENTOUS (AV) GORETEX  GRAFT LEFT THIGH  (Left)  Patient Location: PACU  Anesthesia Type: No value filed.   Level of Consciousness: awake, alert  and oriented  Airway and Oxygen Therapy: Patient Spontanous Breathing  Post-op Pain: none  Post-op Assessment: Post-op Vital signs reviewed  Post-op Vital Signs: Reviewed  Last Vitals:  Filed Vitals:   08/23/15 1112 08/23/15 1127  BP: 99/66 97/80  Pulse: 88 95  Temp:    Resp: 20 16    Complications: No apparent anesthesia complications

## 2015-08-23 NOTE — Op Note (Signed)
NAME: [name]  MRN: [id number] DOB: 12/15/1970    DATE OF OPERATION: 08/23/2015  PREOP DIAGNOSIS: clotted left thigh AV graft  POSTOP DIAGNOSIS: same  PROCEDURE: Thrombectomy and revision of left thigh AV graft (I replaced the arterial/lateral half of the graft.)  SURGEON: Judeth Cornfield. Scot Dock, MD, FACS  ASSIST: Leontine Locket, PA  ANESTHESIA: Gen.   EBL: minimal  INDICATIONS: [name] is a 44 y.o. male who had had multiple previous thrombolysis procedures on his left thigh AV graft. Ultimately it was felt that he would require surgical revision and 5 days ago, Dr. [name] performed thrombectomy and revision of his left thigh AV graft. This occluded again and after discussion with nephrology we elected to attempt one more thrombectomy otherwise he would require placement of a catheter and evaluation for new access possibly in the right thigh.  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 In order to try something different, given the recent thrombectomy, I elected to replace the lateral half of the graft in an anastomosed the arterial limb of the graft higher up on the femoral artery.  TECHNIQUE: The patient was taken to the operating room and received a general anesthetic. The left thigh was prepped and draped in usual sterile fashion. The previous incision in the left groin was opened. I was able to dissected out easily the venous limb of the graft. This was anastomosed end to side to the femoral vein. The arterial limb of the graft was encased in scar tissue and was difficult to get through the incision as it was inferior to this. There was an excellent pulse in the femoral artery above this level however. The venous limb of the graft was divided. A 4 Fogarty catheter was passed through the venous anastomosis without difficulty and there were no problems and then divided the venous anastomosis. I clamped the femoral vein and could directly  inspect the anastomosis and retrieve all clot. 5 dilator passed through the anastomosis without difficulty. This was flushed with heparinized saline and clamped. Next I tented the past 3 catheter but there was some difficulty in passing it through the lateral aspect of the graft because of the aneurysmal area. Ultimately I elected to make a small incision in the proximal thigh to dissected out the arterial limb of the graft. Thrombectomy was achieved but the arterial anastomosis felt somewhat irregular and given the problems he has had with this graft I elected to try something different and replaced again tire arterial half of the graft anastomosis higher up on the common femoral artery.  A separate incision was made of the distal loop of the graft and here the graft was dissected free. It was divided and thrombectomy of the venous limb again performed. I then tunneled a 4-7 mm PTFE graft on the lateral aspect of the thigh lateral to the old graft and the patient was then heparinized. The femoral artery was clamped proximally and distally and a longitudinal arteriotomy was made. A segment of the 4 millimeter side of the graft was excised the graft slightly spatulated and sewn end-to-side to the common femoral artery using continuous 6-0 Prolene suture. Graft  Was then pulled to the appropriate length for anastomosis to the old graft at the distal loop. This was done with continuous 6-0 Prolene suture. Next the venous graft which had been divided was sewn back end and with continuous 6-0 Prolene suture. At the completion was an excellent thrill in the graft.  The heparin was partially reversed with protamine.  The groin incision was closed with a deep layer of 2-0 Vicryl, a subcutaneous layer of 2-0 Vicryl, and the skin closed with a 4-0 silk stitch. The incision the proximal thigh was closed with a deep layer of 3-0 Vicryl and the skin closed with 4-0 Vicryl. The distal incision at the distal aspect of the graft  was closed with a deeper 3-0 Vicryl and skin closed with 4-0 Vicryl. Liquid and was applied. Patient tolerated procedure well and transferred recovery in stable condition. All needle and sponge counts were correct.  [name], MD, FACS [facility]  DATE OF DICTATION:   08/23/2015

## 2015-08-23 NOTE — H&P (Signed)
[facility] of [facility]  Patient name: [name] MRN: [id number] DOB: 1970-11-30 Sex: male  REASON FOR CONSULT: clotted left thigh AV graft  HPI: [name] is a 44 y.o. male, who does dialysis on Monday Wednesdays and Fridays. He last dialyzed Friday. Yesterday he was noted have a clotted graft and comes in today for attempted thrombectomy. Of note, he had undergone multiple previous lysis procedures on the graft and most recently had attempted surgical thrombectomy by Dr. [name] on 08/18/2015. He revised the venous anastomosis.  I discussed the case yesterday with nephrology and we have elected to try again one more thrombectomy, however, he may well likely require placement of a tunneled dialysis catheter. Even at this graft is failed multiple times there is certainly high risk of this not being successful in which case we would need a catheter and then need to be evaluated for a new graft possibly in the right thigh.  He denies any recent uremic symptoms.  Past Medical History  Diagnosis Date  . Hypertension   . Anemia   . Thyroid disease   . Cough     DRY    . Headache(784.0)   . Muscle spasms of neck     BACK, NECK  . Depression   . ESRD on hemodialysis ([place])     HD [facility] MWF    History reviewed. No pertinent family history.  SOCIAL HISTORY: Social History   Social History  . Marital Status: Married    Spouse Name: N/A  . Number of Children: N/A  . Years of Education: N/A   Occupational History  . Not on file.   Social History Main Topics  . Smoking status: Former Smoker    Quit date: 03/21/1986  . Smokeless tobacco: Never Used  . Alcohol Use: No  . Drug Use: No  . Sexual Activity: Not on file   Other Topics Concern  . Not on file   Social History Narrative    Allergies  Allergen Reactions  . Lisinopril Cough    No current facility-administered medications for this encounter.    REVIEW  OF SYSTEMS:  [X]  denotes positive finding, [ ]  denotes negative finding Cardiac  Comments:  Chest pain or chest pressure:    Shortness of breath upon exertion:    Short of breath when lying flat:    Irregular heart rhythm:        Vascular    Pain in calf, thigh, or hip brought on by ambulation:    Pain in feet at night that wakes you up from your sleep:     Blood clot in your veins:    Leg swelling:         Pulmonary    Oxygen at home:    Productive cough:     Wheezing:         Neurologic    Sudden weakness in arms or legs:     Sudden numbness in arms or legs:     Sudden onset of difficulty speaking or slurred speech:    Temporary loss of vision in one eye:     Problems with dizziness:         Gastrointestinal    Blood in stool:     Vomited blood:         Genitourinary    Burning when urinating:     Blood in urine:        Psychiatric  Major depression:         Hematologic    Bleeding problems:    Problems with blood clotting too easily:        Skin    Rashes or ulcers:        Constitutional    Fever or chills:      PHYSICAL EXAM: There were no vitals filed for this visit.  GENERAL: The patient is a well-nourished male, in no acute distress. The vital signs are documented above. CARDIAC: There is a regular rate and rhythm.  VASCULAR: there is no thrill in his left thigh AV graft. PULMONARY: There is good air exchange bilaterally without wheezing or rales. ABDOMEN: Soft and non-tender with normal pitched bowel sounds.  MUSCULOSKELETAL: There are no major deformities or cyanosis. NEUROLOGIC: No focal weakness or paresthesias are detected. SKIN: There are no ulcers or rashes noted. PSYCHIATRIC: The patient has a normal affect.   MEDICAL ISSUES:  CLOTTED LEFT THIGH AV GRAFT: Attempted thrombectomy of his left thigh AV graft. This is not successful and he will require placement of a tunneled dialysis catheter. I have discussed the procedure potential  complications with the patient and he is agreeable to proceed.   [name] [facility] of [place]: [phone]

## 2015-08-24 ENCOUNTER — Encounter (HOSPITAL_COMMUNITY): Payer: Self-pay | Admitting: Vascular Surgery

## 2015-08-24 NOTE — Discharge Summary (Signed)
Vascular and Vein Specialists Discharge Summary  [name] December 08, 1970 44 y.o. male  [id number]  Admission Date: 08/18/2015  Discharge Date: 08/19/2015  Physician: Harold Barban, MD  Admission Diagnosis: Blood Clot Left Thigh  HPI:   This is a 44 y.o. male who dialyzes via a left thigh AVGG, placed 4 years ago. He has had multiple delots recently, and now is in need of surgical revision  Hospital Course:  The patient was admitted to the hospital and taken to the operating room on 08/18/2015 and underwent: thrombectomy and revision of left thigh graft. His venous anastomosis was moved to the common femoral vein via an end-to-side fashion.     The patient tolerated the procedure well and was transported to the PACU in good condition.   He was kept overnight due to lack of transportation. Nephrology was consulted for dialysis management.   POD 1: His left groin incision was clean and intact. His thigh AVG was being used for HD without complication. He was discharged home following HD in good condition.     CBC    Component Value Date/Time   WBC 6.9 08/19/2015 0001   RBC 3.46* 08/19/2015 0001   HGB 13.3 08/23/2015 0845   HCT 39.0 08/23/2015 0845   PLT 182 08/19/2015 0001   MCV 97.7 08/19/2015 0001   MCH 32.1 08/19/2015 0001   MCHC 32.8 08/19/2015 0001   RDW 14.3 08/19/2015 0001   LYMPHSABS 1.5 03/02/2015 0931   MONOABS 0.4 03/02/2015 0931   EOSABS 0.2 03/02/2015 0931   BASOSABS 0.0 03/02/2015 0931    BMET    Component Value Date/Time   NA 135 08/23/2015 0845   K 5.3* 08/23/2015 0845   CL 95* 08/19/2015 0001   CO2 24 08/19/2015 0001   GLUCOSE 82 08/23/2015 0845   BUN 67* 08/19/2015 0001   CREATININE 16.40* 08/19/2015 0001   CALCIUM 8.4* 08/19/2015 0001   CALCIUM 8.7 02/16/2010 0827   GFRNONAA 3* 08/19/2015 0001   GFRAA 4* 08/19/2015 0001     Discharge Instructions:   The patient is discharged to home with extensive instructions on wound  care and progressive ambulation.  They are instructed not to drive or perform any heavy lifting until returning to see the physician in his office.  Discharge Instructions    Call MD for:  redness, tenderness, or signs of infection (pain, swelling, bleeding, redness, odor or green/yellow discharge around incision site)    Complete by:  As directed      Call MD for:  severe or increased pain, loss or decreased feeling  in affected limb(s)    Complete by:  As directed      Call MD for:  temperature >100.5    Complete by:  As directed      Discharge wound care:    Complete by:  As directed   Wash the groin wound with soap and water daily and pat dry. (No tub bath-only shower)  Then put a dry gauze or washcloth there to keep this area dry daily and as needed.  Do not use Vaseline or neosporin on your incisions.  Only use soap and water on your incisions and then protect and keep dry.     Driving Restrictions    Complete by:  As directed   No driving for 1 week and while on pain medication     Increase activity slowly    Complete by:  As directed   Walk with assistance use walker or cane  as needed     Lifting restrictions    Complete by:  As directed   No lifting for 1 week     Resume previous diet    Complete by:  As directed            Discharge Diagnosis:  Blood Clot Left Thigh  Secondary Diagnosis: Patient Active Problem List   Diagnosis Date Noted  . Complication from renal dialysis device   . ESRD (end stage renal disease) on dialysis ([place])   . ESRD (end stage renal disease) ([place])   . HCAP (healthcare-associated pneumonia) 12/14/2014  . Sepsis ([place]) 12/14/2014  . Hypotension 12/14/2014  . Diarrhea   . Hypocalcemia   . Secondary hyperparathyroidism of renal origin ([place]) 04/24/2014  . Hyperparathyroidism, secondary ([place]) 03/21/2014  . ESRD on dialysis [facility]) 03/21/2014   Past Medical History  Diagnosis Date  . Hypertension   . Anemia   . Thyroid disease   . Cough      DRY    . Headache(784.0)   . Muscle spasms of neck     BACK, NECK  . Depression   . ESRD on hemodialysis ([place])     HD [facility] MWF       Medication List    TAKE these medications        acetaminophen 500 MG tablet  Commonly known as:  TYLENOL  Take 1,000 mg by mouth every 6 (six) hours as needed for pain. For pain     calcium carbonate 500 MG chewable tablet  Commonly known as:  TUMS - dosed in mg elemental calcium  Chew 2 tablets (400 mg of elemental calcium total) by mouth 2 (two) times daily between meals.     multivitamin with minerals Tabs tablet  Take 1 tablet by mouth daily.        Percocet #30 No Refill  Disposition: Home  Patient's condition: is Good  Follow up: 1. Dr. Trula Slade as needed   Virgina Jock, PA-C [facility] [phone] 08/24/2015  3:13 PM

## 2015-08-26 ENCOUNTER — Emergency Department (HOSPITAL_COMMUNITY): Payer: Medicare Other

## 2015-08-26 ENCOUNTER — Inpatient Hospital Stay (HOSPITAL_COMMUNITY)
Admission: EM | Admit: 2015-08-26 | Discharge: 2015-08-27 | DRG: 602 | Disposition: A | Discharge status: Home / Self Care | Payer: Medicare Other | Attending: Internal Medicine | Admitting: Internal Medicine

## 2015-08-26 ENCOUNTER — Encounter (HOSPITAL_COMMUNITY): Payer: Self-pay | Admitting: *Deleted

## 2015-08-26 DIAGNOSIS — Z992 Dependence on renal dialysis: Secondary | ICD-10-CM | POA: Diagnosis not present

## 2015-08-26 DIAGNOSIS — I120 Hypertensive chronic kidney disease with stage 5 chronic kidney disease or end stage renal disease: Secondary | ICD-10-CM | POA: Diagnosis present

## 2015-08-26 DIAGNOSIS — I959 Hypotension, unspecified: Secondary | ICD-10-CM | POA: Diagnosis present

## 2015-08-26 DIAGNOSIS — Z87891 Personal history of nicotine dependence: Secondary | ICD-10-CM | POA: Diagnosis not present

## 2015-08-26 DIAGNOSIS — N186 End stage renal disease: Secondary | ICD-10-CM | POA: Diagnosis present

## 2015-08-26 DIAGNOSIS — Q613 Polycystic kidney, unspecified: Secondary | ICD-10-CM | POA: Diagnosis not present

## 2015-08-26 DIAGNOSIS — R109 Unspecified abdominal pain: Secondary | ICD-10-CM | POA: Diagnosis present

## 2015-08-26 DIAGNOSIS — M79605 Pain in left leg: Secondary | ICD-10-CM

## 2015-08-26 DIAGNOSIS — Z9889 Other specified postprocedural states: Secondary | ICD-10-CM

## 2015-08-26 DIAGNOSIS — L03116 Cellulitis of left lower limb: Principal | ICD-10-CM | POA: Diagnosis present

## 2015-08-26 DIAGNOSIS — F329 Major depressive disorder, single episode, unspecified: Secondary | ICD-10-CM | POA: Diagnosis present

## 2015-08-26 DIAGNOSIS — R1033 Periumbilical pain: Secondary | ICD-10-CM

## 2015-08-26 DIAGNOSIS — K219 Gastro-esophageal reflux disease without esophagitis: Secondary | ICD-10-CM | POA: Diagnosis present

## 2015-08-26 DIAGNOSIS — R600 Localized edema: Secondary | ICD-10-CM

## 2015-08-26 LAB — CBC
HCT: 33.4 % — ABNORMAL LOW (ref 39.0–52.0)
Hemoglobin: 11.7 g/dL — ABNORMAL LOW (ref 13.0–17.0)
MCH: 33.4 pg (ref 26.0–34.0)
MCHC: 35 g/dL (ref 30.0–36.0)
MCV: 95.4 fL (ref 78.0–100.0)
PLATELETS: 217 10*3/uL (ref 150–400)
RBC: 3.5 MIL/uL — AB (ref 4.22–5.81)
RDW: 14.1 % (ref 11.5–15.5)
WBC: 8.9 10*3/uL (ref 4.0–10.5)

## 2015-08-26 LAB — COMPREHENSIVE METABOLIC PANEL
ALBUMIN: 3.2 g/dL — AB (ref 3.5–5.0)
ALK PHOS: 62 U/L (ref 38–126)
ALT: 5 U/L — AB (ref 17–63)
AST: 25 U/L (ref 15–41)
Anion gap: 14 (ref 5–15)
BUN: 40 mg/dL — AB (ref 6–20)
CALCIUM: 8.8 mg/dL — AB (ref 8.9–10.3)
CHLORIDE: 95 mmol/L — AB (ref 101–111)
CO2: 28 mmol/L (ref 22–32)
CREATININE: 9.78 mg/dL — AB (ref 0.61–1.24)
GFR calc Af Amer: 7 mL/min — ABNORMAL LOW (ref >60)
GFR calc non Af Amer: 6 mL/min — ABNORMAL LOW (ref >60)
GLUCOSE: 110 mg/dL — AB (ref 65–99)
Potassium: 4.8 mmol/L (ref 3.5–5.1)
SODIUM: 137 mmol/L (ref 135–145)
Total Bilirubin: 0.7 mg/dL (ref 0.3–1.2)
Total Protein: 7.2 g/dL (ref 6.5–8.1)

## 2015-08-26 LAB — I-STAT CHEM 8, ED
BUN: 51 mg/dL — AB (ref 6–20)
CALCIUM ION: 0.88 mmol/L — AB (ref 1.12–1.23)
CHLORIDE: 97 mmol/L — AB (ref 101–111)
CREATININE: 10.2 mg/dL — AB (ref 0.61–1.24)
GLUCOSE: 108 mg/dL — AB (ref 65–99)
HCT: 35 % — ABNORMAL LOW (ref 39.0–52.0)
Hemoglobin: 11.9 g/dL — ABNORMAL LOW (ref 13.0–17.0)
Potassium: 4.6 mmol/L (ref 3.5–5.1)
Sodium: 135 mmol/L (ref 135–145)
TCO2: 30 mmol/L (ref 0–100)

## 2015-08-26 LAB — LIPASE, BLOOD: LIPASE: 27 U/L (ref 11–51)

## 2015-08-26 MED ORDER — PANTOPRAZOLE SODIUM 40 MG PO TBEC
40.0000 mg | DELAYED_RELEASE_TABLET | Freq: Every day | ORAL | Status: DC
  Administered 2015-08-26 – 2015-08-27 (×2): 40 mg via ORAL
  Filled 2015-08-26 (×2): qty 1

## 2015-08-26 MED ORDER — VANCOMYCIN HCL 10 G IV SOLR
1500.0000 mg | Freq: Once | INTRAVENOUS | Status: AC
  Administered 2015-08-26: 1500 mg via INTRAVENOUS
  Filled 2015-08-26: qty 1500

## 2015-08-26 MED ORDER — IOHEXOL 300 MG/ML  SOLN
50.0000 mL | Freq: Once | INTRAMUSCULAR | Status: AC | PRN
  Administered 2015-08-26: 50 mL via INTRAVENOUS

## 2015-08-26 MED ORDER — IOHEXOL 300 MG/ML  SOLN
75.0000 mL | Freq: Once | INTRAMUSCULAR | Status: AC | PRN
  Administered 2015-08-26: 75 mL via INTRAVENOUS

## 2015-08-26 MED ORDER — HYDROMORPHONE HCL 1 MG/ML IJ SOLN: 1.0000 mg | INTRAMUSCULAR | Status: DC | PRN

## 2015-08-26 MED ORDER — OXYCODONE-ACETAMINOPHEN 5-325 MG PO TABS: 1.0000 | ORAL_TABLET | ORAL | Status: DC | PRN

## 2015-08-26 MED ORDER — SODIUM CHLORIDE 0.9 % IV BOLUS (SEPSIS)
500.0000 mL | Freq: Once | INTRAVENOUS | Status: AC
  Administered 2015-08-26: 500 mL via INTRAVENOUS

## 2015-08-26 MED ORDER — ONDANSETRON HCL 4 MG/2ML IJ SOLN
4.0000 mg | Freq: Once | INTRAMUSCULAR | Status: AC
  Administered 2015-08-26: 4 mg via INTRAVENOUS
  Filled 2015-08-26: qty 2

## 2015-08-26 MED ORDER — CALCIUM CARBONATE ANTACID 500 MG PO CHEW
2.0000 | CHEWABLE_TABLET | Freq: Two times a day (BID) | ORAL | Status: DC
  Administered 2015-08-27: 400 mg via ORAL
  Filled 2015-08-26: qty 2

## 2015-08-26 MED ORDER — PIPERACILLIN-TAZOBACTAM 3.375 G IVPB 30 MIN
3.3750 g | Freq: Once | INTRAVENOUS | Status: AC
  Administered 2015-08-26: 3.375 g via INTRAVENOUS
  Filled 2015-08-26 (×2): qty 50

## 2015-08-26 MED ORDER — HEPARIN SODIUM (PORCINE) 5000 UNIT/ML IJ SOLN
5000.0000 [IU] | Freq: Three times a day (TID) | INTRAMUSCULAR | Status: DC
  Filled 2015-08-26: qty 1

## 2015-08-26 MED ORDER — HYDROMORPHONE HCL 1 MG/ML IJ SOLN
1.0000 mg | Freq: Once | INTRAMUSCULAR | Status: AC
  Administered 2015-08-26: 1 mg via INTRAVENOUS
  Filled 2015-08-26: qty 1

## 2015-08-26 MED ORDER — ADULT MULTIVITAMIN W/MINERALS CH
1.0000 | ORAL_TABLET | Freq: Every day | ORAL | Status: DC
  Administered 2015-08-27: 1 via ORAL
  Filled 2015-08-26: qty 1

## 2015-08-26 MED ORDER — VANCOMYCIN HCL IN DEXTROSE 1-5 GM/200ML-% IV SOLN: 1000.0000 mg | Freq: Once | INTRAVENOUS | Status: DC

## 2015-08-26 NOTE — Progress Notes (Signed)
ANTIBIOTIC CONSULT NOTE - INITIAL  Pharmacy Consult for Vancomycin and Zosyn Indication: cellulitis  Allergies  Allergen Reactions  . Lisinopril Cough    Patient Measurements: Height: 6' (182.9 cm) Weight: 160 lb 15 oz (73 kg) IBW/kg (Calculated) : 77.6 Adjusted Body Weight:   Vital Signs: Temp: 98.9 F (37.2 C) (11/23 1531) Temp Source: Oral (11/23 1531) BP: 117/86 mmHg (11/23 1915) Pulse Rate: 93 (11/23 1915) Intake/Output from previous day:   Intake/Output from this shift:    Labs:  Recent Labs  08/26/15 1600 08/26/15 1615  WBC 8.9  --   HGB 11.7* 11.9*  PLT 217  --   CREATININE 9.78* 10.20*   Estimated Creatinine Clearance: 9.6 mL/min (by C-G formula based on Cr of 10.2). No results for input(s): VANCOTROUGH, VANCOPEAK, VANCORANDOM, GENTTROUGH, GENTPEAK, GENTRANDOM, TOBRATROUGH, TOBRAPEAK, TOBRARND, AMIKACINPEAK, AMIKACINTROU, AMIKACIN in the last 72 hours.   Microbiology: No results found for this or any previous visit (from the past 720 hour(s)).  Medical History: Past Medical History  Diagnosis Date  . Hypertension   . Anemia   . Thyroid disease   . Cough     DRY    . Headache(784.0)   . Muscle spasms of neck     BACK, NECK  . Depression   . ESRD on hemodialysis ([place])     HD [facility] MWF    Medications:   (Not in a hospital admission) Scheduled:   Infusions:  . piperacillin-tazobactam    . vancomycin     Assessment: 44yo male with history of ESRD on HD (MWF) with last session on Fri, HTN and thyroid disease presents with concern for abscess in L groin/thigh. Pharmacy is consulted to dose vancomycin and zosyn for suspected cellulitis. Pt is afebrile, WBC 8.9, sCr 10.2.  Pt received vancomycin 1500mg  and zosyn 3.375g IV once in the ED.   Goal of Therapy:  Vancomycin trough level 10-15 mcg/ml  Plan:  Vancomycin 1500mg  IV load followed by 750mg  qHD Zosyn 2.25g IV q8h Measure antibiotic drug levels at steady state Follow up  culture results, HD plan and clinical course  Andrey Cota. Diona Foley, PharmD, [place] Clinical Pharmacist Pager [phone] 08/26/2015,7:29 PM

## 2015-08-26 NOTE — ED Notes (Signed)
Patient is still out of the department at this time for testing

## 2015-08-26 NOTE — ED Notes (Signed)
Pt arrives via [facility] from dialysis c/o Mid lower abd pain rating it a 9/10 on pain scale. Pt states it began yesterday and has gotten worse.

## 2015-08-26 NOTE — Consult Note (Signed)
Vascular Surgery Consultation  Reason for Consult: Allowed infection of left thigh AV graft  HPI: [name] is a 44 y.o. male who presents for evaluation of left thigh AV graft. Patient had 2 recent surgical procedures on left thigh AV graft. On November 15 Dr. Trula Slade performed thrombectomy and revision of the venous anastomosis and on November 20 Dr. Doren Custard replaced the arterial-lateral half of the loop. Patient came to the emergency department with abdominal pain and was noted to have tenderness over the graft and in the inguinal area. Patient has had no chills or fever documented. His main complaint was abdominal discomfort. CT scan was performed which revealed some fluid in the inguinal wound with possible hematoma.   Past Medical History  Diagnosis Date  . Hypertension   . Anemia   . Thyroid disease   . Cough     DRY    . Headache(784.0)   . Muscle spasms of neck     BACK, NECK  . Depression   . ESRD on hemodialysis ([place])     HD [facility] MWF   Past Surgical History  Procedure Laterality Date  . Arteriovenous graft placement Left     "forearm, it's not working; thigh"   . Parathyroidectomy N/A 04/24/2014    Procedure: TOTAL 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;  Surgeon: [name], MD;  Location: [place];  Service: General;  Laterality: N/A;  NECK AND LEFT FOREARM  . Thrombectomy / arteriovenous graft revision Left 08/18/2015    thigh  . Thrombectomy w/ embolectomy Left 08/18/2015    Procedure: THROMBECTOMY  AND REVISION ARTERIOVENOUS GORE-TEX GRAFT/LEFT THIGH;  Surgeon: [name], MD;  Location: [place];  Service: Vascular;  Laterality: Left;  . Thrombectomy and revision of arterioventous (av) goretex  graft Left 08/23/2015    Procedure: THROMBECTOMY AND REVISION OF ARTERIOVENTOUS (AV) GORETEX  GRAFT LEFT THIGH ;  Surgeon: [name], MD;  Location: [place];  Service: Vascular;  Laterality: Left;   Social History   Social  History  . Marital Status: Married    Spouse Name: N/A  . Number of Children: N/A  . Years of Education: N/A   Social History Main Topics  . Smoking status: Former Smoker    Quit date: 03/21/1986  . Smokeless tobacco: Never Used  . Alcohol Use: No  . Drug Use: No  . Sexual Activity: Not Asked   Other Topics Concern  . None   Social History Narrative   No family history on file. Allergies  Allergen Reactions  . Lisinopril Cough   Prior to Admission medications   Medication Sig Start Date End Date Taking? Authorizing Provider  acetaminophen (TYLENOL) 500 MG tablet Take 1,000 mg by mouth every 6 (six) hours as needed for pain. For pain   Yes Historical Provider, MD  calcium carbonate (TUMS - DOSED IN MG ELEMENTAL CALCIUM) 500 MG chewable tablet Chew 2 tablets (400 mg of elemental calcium total) by mouth 2 (two) times daily between meals. 04/26/14  Yes Judeth Horn, MD  Multiple Vitamin (MULTIVITAMIN WITH MINERALS) TABS tablet Take 1 tablet by mouth daily.   Yes Historical Provider, MD  oxyCODONE-acetaminophen (ROXICET) 5-325 MG tablet Take 1 tablet by mouth every 4 (four) hours as needed for moderate pain. Patient not taking: Reported on 08/26/2015 08/23/15   Samantha J Rhyne, PA-C     Positive ROS:   All other systems have been reviewed and were otherwise negative with the exception of those mentioned  in the HPI and as above.  Physical Exam: Filed Vitals:   08/26/15 1930 08/26/15 1945  BP: 109/70 110/67  Pulse: 96 98  Temp:    Resp: 21 17    General: Alert, no acute distress HEENT: Normal for age Cardiovascular: Regular rate and rhythm. Carotid pulses 2+, no bruits audible Respiratory: Clear to auscultation. No cyanosis, no use of accessory musculature GI: No organomegaly, abdomen is soft and non-tender Skin: No lesions in the area of chief complaint Neurologic: Sensation intact distally Psychiatric: Patient is competent for consent with normal mood and  affect Musculoskeletal: No obvious deformities Extremities: Left thigh graft with pulse and palpable thrill. Some induration lateral to left inguinal wound. No fluctuance noted. No erythema or drainage. Left foot adequately perfused.    Imaging reviewed: I reviewed the CT angiogram of the femoral area and do not see anything that would definitively say that the graft is infected. The fluid or hematoma in the inguinal wound is expected following to redo inguinal procedures in the last 7 days.   Assessment/Plan:  Agree with admission of patient the hospital with IV antibiotics and will follow left thigh graft with you Do not feel graft needs to be explored or removed at this time   Tinnie Gens, MD 08/26/2015 8:38 PM

## 2015-08-26 NOTE — ED Provider Notes (Signed)
Patient's CT scan shows hematoma versus abscess in his left groin/thigh. Concern for postoperative infection. My exam shows significant swelling and warmth consistent with at least cellulitis in his left thigh. Discussed with vascular surgery, Dr. Kellie Simmering, who recommends IV antibiotics and admission to medicine as well as he will see the patient tonight to decide if patient needs to go to the OR.  Sherwood Gambler, MD 08/26/15 [phone]

## 2015-08-26 NOTE — ED Notes (Signed)
Patient transported to CT 

## 2015-08-26 NOTE — H&P (Signed)
Date: 08/26/2015               Patient Name:  [name] MRN: [id number]  DOB: 08-10-1971 Age / Sex: 44 y.o., male   PCP: No Pcp Per Patient         Medical Service: Internal Medicine Teaching Service         Attending Physician: Dr. Bartholomew Crews, MD    First Contact: Dr. Berline Lopes Pager: [phone]  Second Contact: Dr. Dellia Nims Pager: [phone]       After Hours (After 5p/  First Contact Pager: [phone]  weekends / holidays): Second Contact Pager: [phone]   Chief Complaint: "My belly started hurting in dialysis."  History of Present Illness: [name] is a 44 year old [place] African man with history of polycystic kidney disease with end stage renal disease on hemodialysis who recently underwent vascular surgeries on his arterio-venous grafts presenting with periumbilical abdominal pain.  On November 15th he underwent thrombectomy and revision of the left thigh arterio-venous graft, and on November 20th he underwent replament of the arterial-lateral half of the loop. He was feeling well until he was in a dialysis session this afternoon. Suddenly, near the end of his session, he began to feel a sharp, constant, periumbilical pain that is tender to the touch and relieved only by pain medications. He denies any associated fever, chills, nausea, radiation of pain to the back nor the groin, reflux symptoms, melena, blood in his stool, diarrhea, or rash. His only other complaint is tenderness and swelling over his recent surgical site but he says it has been getting better slowly since his most recent revision 3 days ago. He has some swelling down to his inner thigh but his genitals aren't involved.  In the emergency department, he was afebrile with normal vital signs. Basic labs were consistent with end-stage renal disease but were otherwise normal with a potassium of 4.6, white blood count of 8.9, hemoglobin stable at 11.9. A CT abdomen and left femur showed a fluid  collection of in the inner left thigh representing a hematoma versus abscess with probable surrounding cellulitis.  Meds: Current Facility-Administered Medications  Medication Dose Route Frequency Provider Last Rate Last Dose  . piperacillin-tazobactam (ZOSYN) IVPB 3.375 g  3.375 g Intravenous Once Rebecka Apley, [facility]      . vancomycin (VANCOCIN) 1,500 mg in sodium chloride 0.9 % 500 mL IVPB  1,500 mg Intravenous Once Rebecka Apley, RPH   1,500 mg at 08/26/15 2135    Allergies: Allergies as of 08/26/2015 - Review Complete 08/26/2015  Allergen Reaction Noted  . Lisinopril Cough 03/03/2015   Past Medical History  Diagnosis Date  . Hypertension   . Anemia   . Thyroid disease   . Cough     DRY    . Headache(784.0)   . Muscle spasms of neck     BACK, NECK  . Depression   . ESRD on hemodialysis ([place])     HD [facility] MWF   Past Surgical History  Procedure Laterality Date  . Arteriovenous graft placement Left     "forearm, it's not working; thigh"   . Parathyroidectomy N/A 04/24/2014    Procedure: TOTAL 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;  Surgeon: [name], MD;  Location: [place];  Service: General;  Laterality: N/A;  NECK AND LEFT FOREARM  . Thrombectomy / arteriovenous graft revision Left 08/18/2015    thigh  . Thrombectomy w/ embolectomy Left 08/18/2015  Procedure: THROMBECTOMY  AND REVISION ARTERIOVENOUS GORE-TEX GRAFT/LEFT THIGH;  Surgeon: [name], MD;  Location: [place];  Service: Vascular;  Laterality: Left;  . Thrombectomy and revision of arterioventous (av) goretex  graft Left 08/23/2015    Procedure: THROMBECTOMY AND REVISION OF ARTERIOVENTOUS (AV) GORETEX  GRAFT LEFT THIGH ;  Surgeon: [name], MD;  Location: [place];  Service: Vascular;  Laterality: Left;   History reviewed. No pertinent family history. Social History   Social History  . Marital Status: Married    Spouse Name: N/A  . Number of Children: N/A  . Years of  Education: N/A   Occupational History  . Not on file.   Social History Main Topics  . Smoking status: Former Smoker    Quit date: 03/21/1986  . Smokeless tobacco: Never Used  . Alcohol Use: No  . Drug Use: No  . Sexual Activity: Not on file   Other Topics Concern  . Not on file   Social History Narrative   Review of Systems  Constitutional: Negative for fever, chills, weight loss, malaise/fatigue and diaphoresis.  HENT: Negative for hearing loss.   Eyes: Negative for blurred vision and double vision.  Respiratory: Negative for cough, hemoptysis, sputum production and shortness of breath.   Cardiovascular: Positive for leg swelling. Negative for chest pain, palpitations, orthopnea, claudication and PND.  Gastrointestinal: Positive for abdominal pain. Negative for heartburn, nausea, vomiting, diarrhea, constipation, blood in stool and melena.  Genitourinary: Negative for dysuria and urgency.  Musculoskeletal: Negative for myalgias and neck pain.  Skin: Negative for itching and rash.  Neurological: Negative for dizziness, tremors, seizures, weakness and headaches.  Psychiatric/Behavioral: Negative for depression and substance abuse.   Physical Exam: Blood pressure 101/59, pulse 87, temperature 98.4 F (36.9 C), temperature source Oral, resp. rate 18, height 6' (1.829 m), weight 73.2 kg (161 lb 6 oz), SpO2 98 %. General: resting in bed comfortably, appropriately conversational, in distress HEENT: no scleral icterus, extra-ocular muscles intact, oropharynx without lesions Cardiac: regular rate and rhythm, no rubs, murmurs or gallops Pulm: breathing well, clear to auscultation bilaterally Abd: bowel sounds normal, soft, non-distended. He is quite tender to light palpation directly over the umbilicus but nowhere else. There is no periumbilical rash.. Ext: warm and well perfused, without pedal edema Lymph: no cervical or supraclavicular lymphadenopathy Skin: On the left upper inner  thigh, there are three well-healing incisions over the graft site with surrounding induration but only subtle warmth and erythema, with reactive lymphadenopathy. There is no involvement of the genitals.  Neuro: alert and oriented X3, cranial nerves II-XII grossly intact, moving all extremities well  Lab results: Basic Metabolic Panel:  Recent Labs  08/26/15 1600 08/26/15 1615  NA 137 135  K 4.8 4.6  CL 95* 97*  CO2 28  --   GLUCOSE 110* 108*  BUN 40* 51*  CREATININE 9.78* 10.20*  CALCIUM 8.8*  --    Liver Function Tests:  Recent Labs  08/26/15 1600  AST 25  ALT 5*  ALKPHOS 62  BILITOT 0.7  PROT 7.2  ALBUMIN 3.2*    Recent Labs  08/26/15 1600  LIPASE 27   CBC:  Recent Labs  08/26/15 1600 08/26/15 1615  WBC 8.9  --   HGB 11.7* 11.9*  HCT 33.4* 35.0*  MCV 95.4  --   PLT 217  --    Imaging results:  Ct Abdomen Pelvis W Contrast  08/26/2015  CLINICAL DATA:  Lower abdominal pain. EXAM: CT ABDOMEN  AND PELVIS WITH CONTRAST TECHNIQUE: Multidetector CT imaging of the abdomen and pelvis was performed using the standard protocol following bolus administration of intravenous contrast. CONTRAST:  20mL OMNIPAQUE IOHEXOL 300 MG/ML SOLN, 77mL OMNIPAQUE IOHEXOL 300 MG/ML SOLN COMPARISON:  CT scan of October 30, 2005. FINDINGS: Visualized lung bases are unremarkable. No significant osseous abnormality is noted. No gallstones are noted. The liver, spleen and pancreas are unremarkable. Adrenal glands appear normal. Severe bilateral polycystic kidney disease is noted. The appendix appears normal. There is no evidence of bowel obstruction. Urinary bladder is decompressed. No abnormal fluid collection is noted. Patent arteriovenous dialysis graft is noted in the left groin region. Mild prostatic calcifications are noted. Stranding is seen in the subcutaneous tissues in the left anterior hip region suggesting edema or possibly inflammation. Mildly enlarged lymph nodes are noted in the left  inguinal region with the largest measuring 18 x 13 mm. IMPRESSION: Severe bilateral polycystic kidney disease is noted. Stranding of the subcutaneous tissues in the left anterior thigh is noted in the region of dialysis arteriovenous graft, which may represent inflammation or edema. Mildly enlarged lymph nodes are noted in this area which most likely are inflammatory in origin. Electronically Signed   By: Marijo Conception, M.D.   On: 08/26/2015 18:30   Ct Femur Left W Contrast  08/26/2015  CLINICAL DATA:  End-stage renal disease with hemodialysis and left thigh AV graft thrombolysis last month. Presents with abdominal pain extending into the left groin. EXAM: CT OF THE LEFT FEMUR WITH CONTRAST TECHNIQUE: Multi detector CT imaging of the left thigh was performed in conjunction with abdominal pelvic CT, dictated separately. CONTRAST:  63mL OMNIPAQUE IOHEXOL 300 MG/ML SOLN, 101mL OMNIPAQUE IOHEXOL 300 MG/ML SOLN COMPARISON:  Abdominal pelvic CT same date. Images from lysis study 07/27/2015. FINDINGS: There is a small fluid collection in the left groin surrounding the femoral vessels and superior aspect of the loop graft. This fluid collection is better defined on the pelvic CT, although measures approximately 4.5 x 3.3 cm transverse and 6.4 cm in length. There is a small air bubble within this collection. This examination was not performed as a dedicated CTA, although the loop graft is patent without evidence of extravasation. There appears to be some outflow stenosis of the common femoral vein, again better seen on the pelvic CT. Remnant of old thrombosed graft is noted laterally in the proximal left thigh. There are reactive lymph nodes within the left groin. There is subcutaneous edema within the proximal left thigh, greatest anteriorly and laterally. No other focal fluid collections are identified. The underlying musculature appears normal. No significant left hip or knee joint effusion. No evidence of  osteomyelitis. IMPRESSION: 1. Nonspecific fluid collection within the left groin surrounding the femoral vessels and superior aspect of the loop graft may reflect a hematoma or abscess. 2. The graft is patent, although some outflow stenosis of the femoral vein is noted. No evidence of extravasation. 3. Probable proximal left thigh cellulitis and reactive adenopathy. 4. No acute or significant osseous findings. Electronically Signed   By: Richardean Sale M.D.   On: 08/26/2015 18:29   Assessment & Plan by Problem: Mr. Contreraz is a 44 year old [place] African man with polycystic kidney disease on hemodialysis presenting with periumbilical abdominal pain during dialysis after undergoing a left femoral AV graft repair 3 days ago. On exam, his abdomen is very tender to the touch, right over the umbilicus. I don't have a great explanation for his abdominal pain,  but I wonder whether he had transient non-occlusive mesenteric ischemia while getting his dialysis, because his pain is so well-localized to his umbilicus and it started near the end of his session. His pain is slowly improving, which goes along with this theory. There's no sign of ischemia on abdominal CT and he does not have a leukocytosis to suggest ongoing ischemia. The other consideration, of course, is referred pain from his surgical site, which may or may not be infected. On exam, he certainly has prominent induration but it does not feel impressively warm nor appear erythematous, and Vascular Surgery believes it appears normal at this stage of recovery. He does now show signs of being systemically ill. We'll treat him with IV antibiotics empirically for now, and Vascular Surgery does not feel he require surgical intervention. I also questioned whether a thrombosis somehow dislodged from his graft and somehow lodged in a small branch off the inferior vena cava, but this doesn't make a lot of sense to me and seems quite unlikely. Most other abdominal  pathology such as pancreatitis, bowel rupture, ongoing mesenteric ischemia, etcetera, were ruled out by the normal abdominal CT. We'll treat empirically for gastroesophageal reflux  Periumbilical abdominal pain: Per above, I believe this could be from transient mesenteric ischemia during dialysis, referred pain from his surgical site, We'll treat his infection, GERD, and control his pain. If he gets much worse, we can consider getting a CT angiogram. -Percocet 5/325 every 4 hours as needed for pain -Dilaudid 1mg  every 3 hours for breakthrough pain -Protonix 40mg  PO daily  Presumed cellulitis surrounding left thigh AV graft repair: Per above, this does not appear to be clinically-infected although there is prominent induration around the surgical site, but CT showed adjacent cellulitis. -Thanks Vascular surgery for following -Continue vancomycin and Zosyn for now  Polycystic kidney disease with end stage renal disease: Per above. The patient did not seem to know he has polycystic kidney disease, and he has young children, so he should be further educated about his disease and the need for blood pressure checks on his children. -He's on hemodialysis on Mondays, Wednesdays, and Fridays -Talk to patient about PKD and need to check children's blood pressure annually  Dispo: Disposition is deferred at this time, awaiting improvement of current medical problems  The patient does not know have a current PCP (No Pcp Per Patient) and does need an [facility] hospital follow-up appointment after discharge.  The patient does not know have transportation limitations that hinder transportation to clinic appointments.  Signed: Loleta Chance, MD 08/26/2015, 9:49 PM

## 2015-08-26 NOTE — ED Notes (Signed)
Pt states he does not make much urine and is unable to give sample.

## 2015-08-26 NOTE — ED Provider Notes (Signed)
CSN: [id number]     Arrival date & time 08/26/15  1529 History   First MD Initiated Contact with Patient 08/26/15 1529     Chief Complaint  Patient presents with  . Abdominal Pain     (Consider location/radiation/quality/duration/timing/severity/associated sxs/prior Treatment) Patient is a 44 y.o. male presenting with abdominal pain. The history is provided by the patient.  Abdominal Pain Pain location:  Periumbilical Pain quality: sharp and shooting   Pain radiates to:  Does not radiate Pain severity:  Severe Onset quality:  Sudden Duration:  2 days Timing:  Constant Progression:  Unchanged Chronicity:  New Relieved by:  Nothing Worsened by:  Nothing tried Ineffective treatments:  None tried Associated symptoms: nausea and vomiting   Associated symptoms: no chest pain, no chills, no diarrhea, no fever and no shortness of breath    44 yo M with a chief complaint of periumbilical abdominal pain. This been going on since yesterday. Patient is unsure what makes it better or worse. Feels like a cramping sharp pain. Patient denies radiation denies fevers or chills. Denies nausea or vomiting. Patient recently had a graft repair done to his left femoral dialysis access. Patient has had some pain and swelling to the area but feels that his abdominal pain is much worse and is more worried about that. Patient states that his pain got much worse during dialysis today. Feels that he finished his entire dialysis session.   Past Medical History  Diagnosis Date  . Hypertension   . Anemia   . Thyroid disease   . Cough     DRY    . Headache(784.0)   . Muscle spasms of neck     BACK, NECK  . Depression   . ESRD on hemodialysis ([place])     HD [facility] MWF   Past Surgical History  Procedure Laterality Date  . Arteriovenous graft placement Left     "forearm, it's not working; thigh"   . Parathyroidectomy N/A 04/24/2014    Procedure: TOTAL PARATHYROIDECTOMY AUTOTRANSPLANT TO LEFT  FOREARM;  Surgeon: [name], MD;  Location: [place];  Service: General;  Laterality: N/A;  NECK AND LEFT FOREARM  . Thrombectomy / arteriovenous graft revision Left 08/18/2015    thigh  . Thrombectomy w/ embolectomy Left 08/18/2015    Procedure: THROMBECTOMY  AND REVISION ARTERIOVENOUS GORE-TEX GRAFT/LEFT THIGH;  Surgeon: [name], MD;  Location: [place];  Service: Vascular;  Laterality: Left;  . Thrombectomy and revision of arterioventous (av) goretex  graft Left 08/23/2015    Procedure: THROMBECTOMY AND REVISION OF ARTERIOVENTOUS (AV) GORETEX  GRAFT LEFT THIGH ;  Surgeon: [name], MD;  Location: [place];  Service: Vascular;  Laterality: Left;   History reviewed. No pertinent family history. Social History  Substance Use Topics  . Smoking status: Former Smoker    Quit date: 03/21/1986  . Smokeless tobacco: Never Used  . Alcohol Use: No    Review of Systems  Constitutional: Negative for fever and chills.  HENT: Negative for congestion and facial swelling.   Eyes: Negative for discharge and visual disturbance.  Respiratory: Negative for shortness of breath.   Cardiovascular: Negative for chest pain and palpitations.  Gastrointestinal: Positive for nausea, vomiting and abdominal pain. Negative for diarrhea.  Musculoskeletal: Negative for myalgias and arthralgias.  Skin: Negative for color change and rash.  Neurological: Negative for tremors, syncope and headaches.  Psychiatric/Behavioral: Negative for confusion and dysphoric mood.      Allergies  Lisinopril  Home Medications   Prior to Admission medications   Medication Sig Start Date End Date Taking? Authorizing Provider  acetaminophen (TYLENOL) 500 MG tablet Take 1,000 mg by mouth every 6 (six) hours as needed for pain. For pain   Yes Historical Provider, MD  calcium carbonate (TUMS - DOSED IN MG ELEMENTAL CALCIUM) 500 MG chewable tablet Chew 2 tablets (400 mg of elemental calcium total) by mouth 2 (two)  times daily between meals. 04/26/14  Yes Judeth Horn, MD  Multiple Vitamin (MULTIVITAMIN WITH MINERALS) TABS tablet Take 1 tablet by mouth daily.   Yes Historical Provider, MD  oxyCODONE-acetaminophen (ROXICET) 5-325 MG tablet Take 1 tablet by mouth every 4 (four) hours as needed for moderate pain. Patient not taking: Reported on 08/26/2015 08/23/15   Aldona Bar J Rhyne, PA-C  pantoprazole (PROTONIX) 40 MG tablet Take 1 tablet (40 mg total) by mouth daily. 08/27/15   Shela Leff, MD   BP 105/64 mmHg  Pulse 100  Temp(Src) 99.1 F (37.3 C) (Oral)  Resp 16  Ht 6' (1.829 m)  Wt 161 lb 6 oz (73.2 kg)  BMI 21.88 kg/m2  SpO2 100% Physical Exam  Constitutional: He is oriented to person, place, and time. He appears well-developed and well-nourished.  HENT:  Head: Normocephalic and atraumatic.  Eyes: EOM are normal. Pupils are equal, round, and reactive to light.  Neck: Normal range of motion. Neck supple. No JVD present.  Cardiovascular: Normal rate and regular rhythm.  Exam reveals no gallop and no friction rub.   No murmur heard. Pulmonary/Chest: No respiratory distress. He has no wheezes.  Abdominal: He exhibits no distension. There is tenderness (diffuse tenderness, localizes to the umbilicus on palpation.). There is no rebound and no guarding.  Musculoskeletal: Normal range of motion. He exhibits edema and tenderness.  Marked erythema and swelling with induration to the left thigh with extension down around his fistula. No noted drainage no open wounds  Neurological: He is alert and oriented to person, place, and time.  Skin: No rash noted. No pallor.  Psychiatric: He has a normal mood and affect. His behavior is normal.  Nursing note and vitals reviewed.   ED Course  Procedures (including critical care time) Labs Review Labs Reviewed  MRSA PCR SCREENING - Abnormal; Notable for the following:    MRSA by PCR INVALID RESULTS, SPECIMEN SENT FOR CULTURE (*)    All other components  within normal limits  COMPREHENSIVE METABOLIC PANEL - Abnormal; Notable for the following:    Chloride 95 (*)    Glucose, Bld 110 (*)    BUN 40 (*)    Creatinine, Ser 9.78 (*)    Calcium 8.8 (*)    Albumin 3.2 (*)    ALT 5 (*)    GFR calc non Af Amer 6 (*)    GFR calc Af Amer 7 (*)    All other components within normal limits  CBC - Abnormal; Notable for the following:    RBC 3.50 (*)    Hemoglobin 11.7 (*)    HCT 33.4 (*)    All other components within normal limits  I-STAT CHEM 8, ED - Abnormal; Notable for the following:    Chloride 97 (*)    BUN 51 (*)    Creatinine, Ser 10.20 (*)    Glucose, Bld 108 (*)    Calcium, Ion 0.88 (*)    Hemoglobin 11.9 (*)    HCT 35.0 (*)    All other components within normal limits  CULTURE,  BLOOD (ROUTINE X 2)  CULTURE, BLOOD (ROUTINE X 2)  MRSA CULTURE  LIPASE, BLOOD  URINALYSIS, ROUTINE W REFLEX MICROSCOPIC (NOT AT [facility])    Imaging Review Ct Abdomen Pelvis W Contrast  08/26/2015  CLINICAL DATA:  Lower abdominal pain. EXAM: CT ABDOMEN AND PELVIS WITH CONTRAST TECHNIQUE: Multidetector CT imaging of the abdomen and pelvis was performed using the standard protocol following bolus administration of intravenous contrast. CONTRAST:  38mL OMNIPAQUE IOHEXOL 300 MG/ML SOLN, 37mL OMNIPAQUE IOHEXOL 300 MG/ML SOLN COMPARISON:  CT scan of October 30, 2005. FINDINGS: Visualized lung bases are unremarkable. No significant osseous abnormality is noted. No gallstones are noted. The liver, spleen and pancreas are unremarkable. Adrenal glands appear normal. Severe bilateral polycystic kidney disease is noted. The appendix appears normal. There is no evidence of bowel obstruction. Urinary bladder is decompressed. No abnormal fluid collection is noted. Patent arteriovenous dialysis graft is noted in the left groin region. Mild prostatic calcifications are noted. Stranding is seen in the subcutaneous tissues in the left anterior hip region suggesting edema or  possibly inflammation. Mildly enlarged lymph nodes are noted in the left inguinal region with the largest measuring 18 x 13 mm. IMPRESSION: Severe bilateral polycystic kidney disease is noted. Stranding of the subcutaneous tissues in the left anterior thigh is noted in the region of dialysis arteriovenous graft, which may represent inflammation or edema. Mildly enlarged lymph nodes are noted in this area which most likely are inflammatory in origin. Electronically Signed   By: Marijo Conception, M.D.   On: 08/26/2015 18:30   Ct Femur Left W Contrast  08/26/2015  CLINICAL DATA:  End-stage renal disease with hemodialysis and left thigh AV graft thrombolysis last month. Presents with abdominal pain extending into the left groin. EXAM: CT OF THE LEFT FEMUR WITH CONTRAST TECHNIQUE: Multi detector CT imaging of the left thigh was performed in conjunction with abdominal pelvic CT, dictated separately. CONTRAST:  48mL OMNIPAQUE IOHEXOL 300 MG/ML SOLN, 10mL OMNIPAQUE IOHEXOL 300 MG/ML SOLN COMPARISON:  Abdominal pelvic CT same date. Images from lysis study 07/27/2015. FINDINGS: There is a small fluid collection in the left groin surrounding the femoral vessels and superior aspect of the loop graft. This fluid collection is better defined on the pelvic CT, although measures approximately 4.5 x 3.3 cm transverse and 6.4 cm in length. There is a small air bubble within this collection. This examination was not performed as a dedicated CTA, although the loop graft is patent without evidence of extravasation. There appears to be some outflow stenosis of the common femoral vein, again better seen on the pelvic CT. Remnant of old thrombosed graft is noted laterally in the proximal left thigh. There are reactive lymph nodes within the left groin. There is subcutaneous edema within the proximal left thigh, greatest anteriorly and laterally. No other focal fluid collections are identified. The underlying musculature appears normal.  No significant left hip or knee joint effusion. No evidence of osteomyelitis. IMPRESSION: 1. Nonspecific fluid collection within the left groin surrounding the femoral vessels and superior aspect of the loop graft may reflect a hematoma or abscess. 2. The graft is patent, although some outflow stenosis of the femoral vein is noted. No evidence of extravasation. 3. Probable proximal left thigh cellulitis and reactive adenopathy. 4. No acute or significant osseous findings. Electronically Signed   By: Richardean Sale M.D.   On: 08/26/2015 18:29   I have personally reviewed and evaluated these images and lab results as part of my medical  decision-making.   EKG Interpretation None      MDM   Final diagnoses:  Left leg pain  Cellulitis of left thigh  Periumbilical abdominal pain    44 yo M with a chief complaint of periumbilical abdominal pain. Patient is writhing in pain on exam. Abdomen is soft no focal tenderness the pain in the right lower left lower quadrant radiates to his umbilicus on palpation. Patient with significant erythema induration and swelling to the left thigh. Concern for possible abscess or cellulitis. Will obtain a CT scan of the abdomen and pelvis with contrast as well as a contrasted study of the left thigh to evaluate for deep space abscess.  Care turned over to Dr. Regenia Skeeter.   The patients results and plan were reviewed and discussed.   Any x-rays performed were independently reviewed by myself.   Differential diagnosis were considered with the presenting HPI.  Medications  multivitamin with minerals tablet 1 tablet (1 tablet Oral Given 08/27/15 1030)  calcium carbonate (TUMS - dosed in mg elemental calcium) chewable tablet 400 mg of elemental calcium (400 mg of elemental calcium Oral Given 08/27/15 1030)  oxyCODONE-acetaminophen (PERCOCET/ROXICET) 5-325 MG per tablet 1 tablet (not administered)  heparin injection 5,000 Units (5,000 Units Subcutaneous Not Given  08/27/15 0601)  HYDROmorphone (DILAUDID) injection 1 mg (not administered)  pantoprazole (PROTONIX) EC tablet 40 mg (40 mg Oral Given 08/27/15 1030)  HYDROmorphone (DILAUDID) injection 1 mg (1 mg Intravenous Given 08/26/15 1603)  ondansetron (ZOFRAN) injection 4 mg (4 mg Intravenous Given 08/26/15 1602)  sodium chloride 0.9 % bolus 500 mL (0 mLs Intravenous Stopped 08/26/15 1752)  iohexol (OMNIPAQUE) 300 MG/ML solution 75 mL (75 mLs Intravenous Contrast Given 08/26/15 1655)  iohexol (OMNIPAQUE) 300 MG/ML solution 50 mL (50 mLs Intravenous Contrast Given 08/26/15 1654)  piperacillin-tazobactam (ZOSYN) IVPB 3.375 g (3.375 g Intravenous Given 08/26/15 2210)  vancomycin (VANCOCIN) 1,500 mg in sodium chloride 0.9 % 500 mL IVPB (1,500 mg Intravenous Given 08/26/15 2135)  sodium chloride 0.9 % bolus 500 mL (500 mLs Intravenous Given 08/27/15 1156)    Filed Vitals:   08/27/15 0507 08/27/15 0756 08/27/15 1135 08/27/15 1318  BP: 91/60 97/61 92/60  105/64  Pulse: 102 90  100  Temp: 98.3 F (36.8 C) 99.5 F (37.5 C)  99.1 F (37.3 C)  TempSrc: Oral Oral  Oral  Resp: 20 18  16   Height:      Weight:      SpO2: 99% 100%  100%    Final diagnoses:  Left leg pain  Cellulitis of left thigh  Periumbilical abdominal pain      Deno Etienne, DO 08/27/15 1345

## 2015-08-26 NOTE — Progress Notes (Signed)
Report obtained from ED RN. Room ready for patient. Clarrissa Shimkus Joselita, RN 

## 2015-08-27 DIAGNOSIS — Z992 Dependence on renal dialysis: Secondary | ICD-10-CM

## 2015-08-27 DIAGNOSIS — T827XXD Infection and inflammatory reaction due to other cardiac and vascular devices, implants and grafts, subsequent encounter: Secondary | ICD-10-CM

## 2015-08-27 DIAGNOSIS — R109 Unspecified abdominal pain: Secondary | ICD-10-CM

## 2015-08-27 DIAGNOSIS — N186 End stage renal disease: Secondary | ICD-10-CM

## 2015-08-27 DIAGNOSIS — B9689 Other specified bacterial agents as the cause of diseases classified elsewhere: Secondary | ICD-10-CM

## 2015-08-27 DIAGNOSIS — L03116 Cellulitis of left lower limb: Principal | ICD-10-CM

## 2015-08-27 LAB — MRSA PCR SCREENING: MRSA by PCR: INVALID — AB

## 2015-08-27 MED ORDER — SODIUM CHLORIDE 0.9 % IV BOLUS (SEPSIS)
500.0000 mL | Freq: Once | INTRAVENOUS | Status: AC
  Administered 2015-08-27: 500 mL via INTRAVENOUS

## 2015-08-27 MED ORDER — PANTOPRAZOLE SODIUM 40 MG PO TBEC: 40.0000 mg | DELAYED_RELEASE_TABLET | Freq: Every day | ORAL | Status: DC

## 2015-08-27 NOTE — Discharge Instructions (Signed)
Please give patient Vancomycin 750mg  qHD (last date of administration 09/09/15).   In addition, please check Vancomycin level at hemodialysis.

## 2015-08-27 NOTE — Progress Notes (Signed)
Subjective: Patient was seen and examined at bedside this morning. Reports having [id number] periumbilical pain. Denies having any fevers, chills, nausea, vomiting, diarrhea, or constipation. States he is tolerating PO intake well and last BM was yesterday. Denies having any pain at the site of AV graft repair.   Patient states his peri-umbilical abdominal pain started on Tuesday prior to dialysis (goes for dialysis MWF). States the pain was mild/ bearable at that time and he drove himself to dialysis on Wednesday. As per patient, the pain became worse during dialysis, and as such, dialysis was stopped and EMS called. He does report having chronic constipation from taking a calcium supplement.   Objective: Vital signs in last 24 hours: Filed Vitals:   08/26/15 1945 08/26/15 2047 08/27/15 0507 08/27/15 0756  BP: 110/67 101/59 91/60 97/61   Pulse: 98 87 102 90  Temp:  98.4 F (36.9 C) 98.3 F (36.8 C) 99.5 F (37.5 C)  TempSrc:  Oral Oral Oral  Resp: 17 18 20 18   Height:      Weight:  161 lb 6 oz (73.2 kg)    SpO2: 98% 98% 99% 100%   Weight change:   Intake/Output Summary (Last 24 hours) at 08/27/15 1045 Last data filed at 08/27/15 0859  Gross per 24 hour  Intake   1030 ml  Output      0 ml  Net   1030 ml   Physical Exam: General: resting in bed comfortably, appropriately conversational, in no acute distress Cardiac: regular rate and rhythm, no rubs, murmurs or gallops Pulm: Lungs CTAB. No rales, rhonchi, or wheezing.  Abd: +BS. Non-distended, non-tender. No rebound, guarding, or rigidity.  Ext: warm and well perfused, without pedal edema Skin: On the left upper inner thigh, there are three well-healing incisions over the graft site with surrounding induration but only subtle warmth and erythema.  Neuro: alert and oriented X3  Lab Results: Basic Metabolic Panel:  Recent Labs Lab 08/26/15 1600 08/26/15 1615  NA 137 135  K 4.8 4.6  CL 95* 97*  CO2 28  --   GLUCOSE 110* 108*   BUN 40* 51*  CREATININE 9.78* 10.20*  CALCIUM 8.8*  --    Liver Function Tests:  Recent Labs Lab 08/26/15 1600  AST 25  ALT 5*  ALKPHOS 62  BILITOT 0.7  PROT 7.2  ALBUMIN 3.2*    Recent Labs Lab 08/26/15 1600  LIPASE 27   CBC:  Recent Labs Lab 08/26/15 1600 08/26/15 1615  WBC 8.9  --   HGB 11.7* 11.9*  HCT 33.4* 35.0*  MCV 95.4  --   PLT 217  --    Micro Results: Recent Results (from the past 240 hour(s))  Blood culture (routine x 2)     Status: None (Preliminary result)   Collection Time: 08/26/15  8:18 PM  Result Value Ref Range Status   Specimen Description BLOOD RIGHT ARM  Final   Special Requests BOTTLES DRAWN AEROBIC AND ANAEROBIC 5CC  Final   Culture NO GROWTH < 12 HOURS  Final   Report Status PENDING  Incomplete  Blood culture (routine x 2)     Status: None (Preliminary result)   Collection Time: 08/26/15  8:23 PM  Result Value Ref Range Status   Specimen Description BLOOD RIGHT FOREARM  Final   Special Requests BOTTLES DRAWN AEROBIC AND ANAEROBIC 5CC  Final   Culture NO GROWTH < 12 HOURS  Final   Report Status PENDING  Incomplete  MRSA PCR Screening  Status: Abnormal   Collection Time: 08/26/15 10:34 PM  Result Value Ref Range Status   MRSA by PCR INVALID RESULTS, SPECIMEN SENT FOR CULTURE (A) NEGATIVE Final    Comment: RESULT CALLED TO, READ BACK BY AND VERIFIED WITH: [facility] @0133  08/27/15 MKELLY        The GeneXpert MRSA Assay (FDA approved for NASAL specimens only), is one component of a comprehensive MRSA colonization surveillance program. It is not intended to diagnose MRSA infection nor to guide or monitor treatment for MRSA infections.    Studies/Results: Ct Abdomen Pelvis W Contrast  08/26/2015  CLINICAL DATA:  Lower abdominal pain. EXAM: CT ABDOMEN AND PELVIS WITH CONTRAST TECHNIQUE: Multidetector CT imaging of the abdomen and pelvis was performed using the standard protocol following bolus administration of intravenous  contrast. CONTRAST:  43mL OMNIPAQUE IOHEXOL 300 MG/ML SOLN, 43mL OMNIPAQUE IOHEXOL 300 MG/ML SOLN COMPARISON:  CT scan of October 30, 2005. FINDINGS: Visualized lung bases are unremarkable. No significant osseous abnormality is noted. No gallstones are noted. The liver, spleen and pancreas are unremarkable. Adrenal glands appear normal. Severe bilateral polycystic kidney disease is noted. The appendix appears normal. There is no evidence of bowel obstruction. Urinary bladder is decompressed. No abnormal fluid collection is noted. Patent arteriovenous dialysis graft is noted in the left groin region. Mild prostatic calcifications are noted. Stranding is seen in the subcutaneous tissues in the left anterior hip region suggesting edema or possibly inflammation. Mildly enlarged lymph nodes are noted in the left inguinal region with the largest measuring 18 x 13 mm. IMPRESSION: Severe bilateral polycystic kidney disease is noted. Stranding of the subcutaneous tissues in the left anterior thigh is noted in the region of dialysis arteriovenous graft, which may represent inflammation or edema. Mildly enlarged lymph nodes are noted in this area which most likely are inflammatory in origin. Electronically Signed   By: Marijo Conception, M.D.   On: 08/26/2015 18:30   Ct Femur Left W Contrast  08/26/2015  CLINICAL DATA:  End-stage renal disease with hemodialysis and left thigh AV graft thrombolysis last month. Presents with abdominal pain extending into the left groin. EXAM: CT OF THE LEFT FEMUR WITH CONTRAST TECHNIQUE: Multi detector CT imaging of the left thigh was performed in conjunction with abdominal pelvic CT, dictated separately. CONTRAST:  12mL OMNIPAQUE IOHEXOL 300 MG/ML SOLN, 83mL OMNIPAQUE IOHEXOL 300 MG/ML SOLN COMPARISON:  Abdominal pelvic CT same date. Images from lysis study 07/27/2015. FINDINGS: There is a small fluid collection in the left groin surrounding the femoral vessels and superior aspect of the loop  graft. This fluid collection is better defined on the pelvic CT, although measures approximately 4.5 x 3.3 cm transverse and 6.4 cm in length. There is a small air bubble within this collection. This examination was not performed as a dedicated CTA, although the loop graft is patent without evidence of extravasation. There appears to be some outflow stenosis of the common femoral vein, again better seen on the pelvic CT. Remnant of old thrombosed graft is noted laterally in the proximal left thigh. There are reactive lymph nodes within the left groin. There is subcutaneous edema within the proximal left thigh, greatest anteriorly and laterally. No other focal fluid collections are identified. The underlying musculature appears normal. No significant left hip or knee joint effusion. No evidence of osteomyelitis. IMPRESSION: 1. Nonspecific fluid collection within the left groin surrounding the femoral vessels and superior aspect of the loop graft may reflect a hematoma or abscess. 2. The graft  is patent, although some outflow stenosis of the femoral vein is noted. No evidence of extravasation. 3. Probable proximal left thigh cellulitis and reactive adenopathy. 4. No acute or significant osseous findings. Electronically Signed   By: Richardean Sale M.D.   On: 08/26/2015 18:29   Medications: I have reviewed the patient's current medications. Scheduled Meds: . calcium carbonate  2 tablet Oral BID BM  . heparin  5,000 Units Subcutaneous 3 times per day  . multivitamin with minerals  1 tablet Oral Daily  . pantoprazole  40 mg Oral Daily   Continuous Infusions:  PRN Meds:.HYDROmorphone (DILAUDID) injection, oxyCODONE-acetaminophen Assessment/Plan: Principal Problem:   Abdominal pain Active Problems:   ESRD (end stage renal disease) on dialysis ([place])   Cellulitis of left thigh  Periumbilical abdominal pain:  Patient has a hx of polycystic kidney disease on hemodialysis presenting with periumbilical  abdominal pain during dialysis yesterday after undergoing a left femoral AV graft repair 3 days ago. Patient does report having chronic constipation secondary to taking a calcium supplement, which could have possibly caused his abdominal pain. However, transient mesenteric ischemia is also on the differential because pain was worsened during dialysis. In addition, it could also possibly be referred pain from his LUE surgical site. As present, patient reports having 3/10 intensity peri-umbilical abdominal pain without being on any pain medication since yesterday afternoon. No abdominal distention, tenderness, guarding, or rigidity on exam. Pain not likely from ileus because patient's last BM as yesterday and he has +BS on exam. There's no sign of ischemia on abdominal CT. Patient is stable and will likely be discharged today.  -cont Percocet 5/325 every 4 hours as needed for pain -Dilaudid 1mg  every 3 hours for breakthrough pain -Protonix 40mg  PO daily -F/u with PCP within 2 weeks  Cellulitis surrounding left thigh AV graft repair: On November 15th patient underwent thrombectomy and revision of the left thigh arterio-venous graft and on November 20th he underwent replament of the arterial-lateral half of the loop. CT showing nonspecific fluid collection within the left groin surrounding the femoral vessels and superior aspect of the loop graft, which may reflect a hematoma or abscess. The graft appeared patent, although some outflow stenosis of the femoral vein was noted on CT. No evidence of extravasation. Ct also suggested probable proximal left thigh cellulitis and reactive adenopathy. On exam, he has prominent induration but it does not feel impressively warm nor does it appear erythematous. Vascular Surgery believes it appears normal at this stage of recovery and they do not believe the graft needs to be explored or removed at this time. Patient does not show any signs of being systemically ill.  -Thanks  Vascular surgery for following -Continue vancomycin and Zosyn for now. Patient is stable and will likely be discharged today. Zosyn will be discontinued. We spoke to Nephrology today and they will continue to give patient Vancomycin via HD for a total 14 day course (last date of treatment 12/7).    Dispo: Disposition is deferred at this time, awaiting improvement of current medical problems.  Anticipated discharge in approximately 0-1 day(s).   The patient does have a current PCP (No Pcp Per Patient) and does need an [facility] hospital follow-up appointment after discharge.  The patient does not have transportation limitations that hinder transportation to clinic appointments.  .Services Needed at time of discharge: Y = Yes, Blank = No PT:   OT:   RN:   Equipment:   Other:     LOS: 1 day  Shela Leff, MD 08/27/2015, 10:45 AM

## 2015-08-27 NOTE — Discharge Summary (Signed)
Name: [name] MRN: [id number] DOB: 1970/12/17 44 y.o. PCP: No Pcp Per Patient  Date of Admission: 08/26/2015  3:29 PM Date of Discharge: 08/28/2015 Attending Physician: No att. providers found  Discharge Diagnosis:   Principal Problem:   Abdominal pain Active Problems:   ESRD (end stage renal disease) on dialysis ([place])   Cellulitis of left thigh  Discharge Medications:   Medication List    TAKE these medications        acetaminophen 500 MG tablet  Commonly known as:  TYLENOL  Take 1,000 mg by mouth every 6 (six) hours as needed for pain. For pain     calcium carbonate 500 MG chewable tablet  Commonly known as:  TUMS - dosed in mg elemental calcium  Chew 2 tablets (400 mg of elemental calcium total) by mouth 2 (two) times daily between meals.     multivitamin with minerals Tabs tablet  Take 1 tablet by mouth daily.     oxyCODONE-acetaminophen 5-325 MG tablet  Commonly known as:  ROXICET  Take 1 tablet by mouth every 4 (four) hours as needed for moderate pain.     pantoprazole 40 MG tablet  Commonly known as:  PROTONIX  Take 1 tablet (40 mg total) by mouth daily.        Disposition and follow-up:   Mr.[name] was discharged from [facility] in Stable condition.  At the hospital follow up visit please address:  1.  Cellulitis surrounding left thigh AV graft repair - did he finish his 14 day course of Vancomycin? Any signs of systemic infection?   Is he still having abdominal pain?  2.  Labs / imaging needed at time of follow-up:  3.  Pending labs/ test needing follow-up: Blood culture   Follow-up Appointments:     Follow-up Information    Follow up with Tinnie Gens, MD. Schedule an appointment as soon as possible for a visit in 2 weeks.   Specialties:  Vascular Surgery, Interventional Cardiology, Cardiology   Why:  Follow up   Contact information:   [address] [place] [address] [phone]       Follow up with [place]. Schedule an appointment as soon as possible for a visit in 2 weeks.   Why:  Follow up   Contact information:   [address] [place] [id number]      Discharge Instructions:   Consultations: Treatment Team:  Donato Heinz, MD [name], MD  Procedures Performed:  Ct Abdomen Pelvis W Contrast  08/26/2015  CLINICAL DATA:  Lower abdominal pain. EXAM: CT ABDOMEN AND PELVIS WITH CONTRAST TECHNIQUE: Multidetector CT imaging of the abdomen and pelvis was performed using the standard protocol following bolus administration of intravenous contrast. CONTRAST:  20mL OMNIPAQUE IOHEXOL 300 MG/ML SOLN, 41mL OMNIPAQUE IOHEXOL 300 MG/ML SOLN COMPARISON:  CT scan of October 30, 2005. FINDINGS: Visualized lung bases are unremarkable. No significant osseous abnormality is noted. No gallstones are noted. The liver, spleen and pancreas are unremarkable. Adrenal glands appear normal. Severe bilateral polycystic kidney disease is noted. The appendix appears normal. There is no evidence of bowel obstruction. Urinary bladder is decompressed. No abnormal fluid collection is noted. Patent arteriovenous dialysis graft is noted in the left groin region. Mild prostatic calcifications are noted. Stranding is seen in the subcutaneous tissues in the left anterior hip region suggesting edema or possibly inflammation. Mildly enlarged lymph nodes are noted in the left  inguinal region with the largest measuring 18 x 13 mm. IMPRESSION: Severe bilateral polycystic kidney disease is noted. Stranding of the subcutaneous tissues in the left anterior thigh is noted in the region of dialysis arteriovenous graft, which may represent inflammation or edema. Mildly enlarged lymph nodes are noted in this area which most likely are inflammatory in origin. Electronically Signed   By: Marijo Conception, M.D.   On: 08/26/2015 18:30   Ct Femur Left W  Contrast  08/26/2015  CLINICAL DATA:  End-stage renal disease with hemodialysis and left thigh AV graft thrombolysis last month. Presents with abdominal pain extending into the left groin. EXAM: CT OF THE LEFT FEMUR WITH CONTRAST TECHNIQUE: Multi detector CT imaging of the left thigh was performed in conjunction with abdominal pelvic CT, dictated separately. CONTRAST:  22mL OMNIPAQUE IOHEXOL 300 MG/ML SOLN, 32mL OMNIPAQUE IOHEXOL 300 MG/ML SOLN COMPARISON:  Abdominal pelvic CT same date. Images from lysis study 07/27/2015. FINDINGS: There is a small fluid collection in the left groin surrounding the femoral vessels and superior aspect of the loop graft. This fluid collection is better defined on the pelvic CT, although measures approximately 4.5 x 3.3 cm transverse and 6.4 cm in length. There is a small air bubble within this collection. This examination was not performed as a dedicated CTA, although the loop graft is patent without evidence of extravasation. There appears to be some outflow stenosis of the common femoral vein, again better seen on the pelvic CT. Remnant of old thrombosed graft is noted laterally in the proximal left thigh. There are reactive lymph nodes within the left groin. There is subcutaneous edema within the proximal left thigh, greatest anteriorly and laterally. No other focal fluid collections are identified. The underlying musculature appears normal. No significant left hip or knee joint effusion. No evidence of osteomyelitis. IMPRESSION: 1. Nonspecific fluid collection within the left groin surrounding the femoral vessels and superior aspect of the loop graft may reflect a hematoma or abscess. 2. The graft is patent, although some outflow stenosis of the femoral vein is noted. No evidence of extravasation. 3. Probable proximal left thigh cellulitis and reactive adenopathy. 4. No acute or significant osseous findings. Electronically Signed   By: Richardean Sale M.D.   On: 08/26/2015  18:29   Admission HPI: Mr. [name] is a 44 year old [place] African man with history of polycystic kidney disease with end stage renal disease on hemodialysis who recently underwent vascular surgeries on his arterio-venous grafts presenting with periumbilical abdominal pain.  On November 15th he underwent thrombectomy and revision of the left thigh arterio-venous graft, and on November 20th he underwent replament of the arterial-lateral half of the loop. He was feeling well until he was in a dialysis session this afternoon. Suddenly, near the end of his session, he began to feel a sharp, constant, periumbilical pain that is tender to the touch and relieved only by pain medications. He denies any associated fever, chills, nausea, radiation of pain to the back nor the groin, reflux symptoms, melena, blood in his stool, diarrhea, or rash. His only other complaint is tenderness and swelling over his recent surgical site but he says it has been getting better slowly since his most recent revision 3 days ago. He has some swelling down to his inner thigh but his genitals aren't involved.  In the emergency department, he was afebrile with normal vital signs. Basic labs were consistent with end-stage renal disease but were otherwise normal with a potassium of 4.6,  white blood count of 8.9, hemoglobin stable at 11.9. A CT abdomen and left femur showed a fluid collection of in the inner left thigh representing a hematoma versus abscess with probable surrounding cellulitis.  Hospital Course by problem list: Principal Problem:   Abdominal pain Active Problems:   ESRD (end stage renal disease) on dialysis ([place])   Cellulitis of left thigh   Periumbilical abdominal pain: Patient has a hx of polycystic kidney disease on hemodialysis. He presented with periumbilical abdominal pain during dialysis after undergoing a left femoral AV graft repair 3 days prior. He did report having chronic constipation secondary to taking  calcium supplement, which could have possibly caused his abdominal pain. However, transient mesenteric ischemia was also on the differential because pain was worsened during dialysis. There was no sign of ischemia on abdominal CT. He was treated with pain meds and PPI. The following morning patient reported having minimal pain despite not being on any pain meds since the day before. No abdominal distention, tenderness, guarding, or rigidity on exam. +BS. He is to follow up at our clinic within 2 weeks.   Cellulitis surrounding left thigh AV graft repair: On November 15th patient underwent thrombectomy and revision of the left thigh arterio-venous graft and on November 20th he underwent replament of the arterial-lateral half of the loop. CT showing nonspecific fluid collection within the left groin surrounding the femoral vessels and superior aspect of the loop graft, which may reflect a hematoma or abscess. The graft appeared patent, although some outflow stenosis of the femoral vein was noted on CT. No evidence of extravasation. CT also suggested probable proximal left thigh cellulitis and reactive adenopathy. On exam, patient had prominent induration but the area did not feel impressively warm nor did it appear erythematous. As per Vascular Surgery, the site of graft repair appeared normal at this stage of recovery and they did not think the graft needed to be explored or removed at this time. Patient does not show any signs of being systemically ill. He was initially treated with broad spectrum antibiotics Vancomycin and Zosyn. We spoke to Nephrology and they will continue to give patient Vancomycin via HD for a total 14 day course (last date of treatment 12/7). Zosyn was discontinued upon discharge. He is to follow up at our clinic within 2 weeks.    Discharge Vitals:   BP 100/64 mmHg  Pulse 89  Temp(Src) 98.5 F (36.9 C) (Oral)  Resp 17  Ht 6' (1.829 m)  Wt 161 lb 6 oz (73.2 kg)  BMI 21.88 kg/m2   SpO2 100%  Discharge Labs:  No results found for this or any previous visit (from the past 24 hour(s)).  Signed: Shela Leff, MD 08/28/2015, 11:12 AM    Services Ordered on Discharge:  Equipment Ordered on Discharge:

## 2015-08-27 NOTE — Progress Notes (Signed)
[name] Rising to be D/C'd Home per MD order.  Discussed prescriptions and follow up appointments with the patient. Prescriptions electronically sent to pharmacy, medication list explained in detail. Pt verbalized understanding.    Medication List    TAKE these medications        acetaminophen 500 MG tablet  Commonly known as:  TYLENOL  Take 1,000 mg by mouth every 6 (six) hours as needed for pain. For pain     calcium carbonate 500 MG chewable tablet  Commonly known as:  TUMS - dosed in mg elemental calcium  Chew 2 tablets (400 mg of elemental calcium total) by mouth 2 (two) times daily between meals.     multivitamin with minerals Tabs tablet  Take 1 tablet by mouth daily.     oxyCODONE-acetaminophen 5-325 MG tablet  Commonly known as:  ROXICET  Take 1 tablet by mouth every 4 (four) hours as needed for moderate pain.     pantoprazole 40 MG tablet  Commonly known as:  PROTONIX  Take 1 tablet (40 mg total) by mouth daily.        Filed Vitals:   08/27/15 1318 08/27/15 1510  BP: 105/64 100/64  Pulse: 100 89  Temp: 99.1 F (37.3 C) 98.5 F (36.9 C)  Resp: 16 17    Skin clean, dry and intact without evidence of skin break down, no evidence of skin tears noted. IV catheter discontinued intact. Site without signs and symptoms of complications. Dressing and pressure applied. Pt denies pain at this time. No complaints noted.  An After Visit Summary was printed and given to the patient. Patient escorted via [place], and D/C home via private auto.  [name] 08/27/2015 5:18 PM

## 2015-08-28 LAB — MRSA CULTURE

## 2015-08-31 LAB — CULTURE, BLOOD (ROUTINE X 2)
CULTURE: NO GROWTH
Culture: NO GROWTH

## 2015-09-08 ENCOUNTER — Encounter: Payer: Self-pay | Admitting: General Practice

## 2015-11-06 ENCOUNTER — Encounter: Payer: Self-pay | Admitting: Vascular Surgery

## 2015-11-06 ENCOUNTER — Other Ambulatory Visit: Payer: Self-pay | Admitting: *Deleted

## 2015-11-06 ENCOUNTER — Other Ambulatory Visit: Payer: Self-pay

## 2015-11-06 ENCOUNTER — Ambulatory Visit (INDEPENDENT_AMBULATORY_CARE_PROVIDER_SITE_OTHER): Payer: Self-pay | Admitting: Vascular Surgery

## 2015-11-06 ENCOUNTER — Ambulatory Visit (INDEPENDENT_AMBULATORY_CARE_PROVIDER_SITE_OTHER)
Admission: RE | Admit: 2015-11-06 | Discharge: 2015-11-06 | Disposition: A | Discharge status: Home / Self Care | Payer: Self-pay | Source: Ambulatory Visit | Attending: Vascular Surgery | Admitting: Vascular Surgery

## 2015-11-06 VITALS — BP 123/79 | HR 102 | Temp 98.1°F | Resp 18 | Ht 72.0 in | Wt 171.0 lb

## 2015-11-06 DIAGNOSIS — N186 End stage renal disease: Secondary | ICD-10-CM | POA: Insufficient documentation

## 2015-11-06 DIAGNOSIS — I728 Aneurysm of other specified arteries: Secondary | ICD-10-CM | POA: Insufficient documentation

## 2015-11-06 DIAGNOSIS — Z992 Dependence on renal dialysis: Secondary | ICD-10-CM

## 2015-11-06 DIAGNOSIS — I120 Hypertensive chronic kidney disease with stage 5 chronic kidney disease or end stage renal disease: Secondary | ICD-10-CM

## 2015-11-06 DIAGNOSIS — R1909 Other intra-abdominal and pelvic swelling, mass and lump: Secondary | ICD-10-CM

## 2015-11-06 DIAGNOSIS — T82898A Other specified complication of vascular prosthetic devices, implants and grafts, initial encounter: Secondary | ICD-10-CM

## 2015-11-06 DIAGNOSIS — T82511A Breakdown (mechanical) of surgically created arteriovenous shunt, initial encounter: Secondary | ICD-10-CM | POA: Insufficient documentation

## 2015-11-06 NOTE — Progress Notes (Signed)
Established Dialysis Access  History of Present Illness  [name] is a 45 y.o. (02/22/1971) male who presents for evaluation of large pulse in left groin.  This patient notes onset of big pulsatile mass in left groin ~2 weeks ago.  He is s/p T&R L thigh AVG on 08/23/15 with Dr. Scot Dock.  The patient denies any complications initially from that surgery.  He had been dialyzing without difficulty.  He notes some "fever" and chills over the last few days.  Reportedly he has been given antibiotic on HD.  Past Medical History  Diagnosis Date  . Hypertension   . Anemia   . Thyroid disease   . Cough     DRY    . Headache(784.0)   . Muscle spasms of neck     BACK, NECK  . Depression   . ESRD on hemodialysis ([place])     HD [facility] MWF    Past Surgical History  Procedure Laterality Date  . Arteriovenous graft placement Left     "forearm, it's not working; thigh"   . Parathyroidectomy N/A 04/24/2014    Procedure: TOTAL 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;  Surgeon: [name], MD;  Location: [facility];  Service: General;  Laterality: N/A;  NECK AND LEFT FOREARM  . Thrombectomy / arteriovenous graft revision Left 08/18/2015    thigh  . Thrombectomy w/ embolectomy Left 08/18/2015    Procedure: THROMBECTOMY  AND REVISION ARTERIOVENOUS GORE-TEX GRAFT/LEFT THIGH;  Surgeon: [name], MD;  Location: [facility];  Service: Vascular;  Laterality: Left;  . Thrombectomy and revision of arterioventous (av) goretex  graft Left 08/23/2015    Procedure: THROMBECTOMY AND REVISION OF ARTERIOVENTOUS (AV) GORETEX  GRAFT LEFT THIGH ;  Surgeon: [name], MD;  Location: [facility];  Service: Vascular;  Laterality: Left;    Social History   Social History  . Marital Status: Married    Spouse Name: N/A  . Number of Children: N/A  . Years of Education: N/A   Occupational History  . Not on file.   Social History Main Topics  . Smoking status: Former Smoker   Quit date: 03/21/1986  . Smokeless tobacco: Never Used  . Alcohol Use: No  . Drug Use: No  . Sexual Activity: Not on file   Other Topics Concern  . Not on file   Social History Narrative    Family History: patient is unable to detail the medical history of his parents   Current Outpatient Prescriptions  Medication Sig Dispense Refill  . acetaminophen (TYLENOL) 500 MG tablet Take 1,000 mg by mouth every 6 (six) hours as needed for pain. For pain    . calcium carbonate (TUMS - DOSED IN MG ELEMENTAL CALCIUM) 500 MG chewable tablet Chew 2 tablets (400 mg of elemental calcium total) by mouth 2 (two) times daily between meals. 120 tablet 3  . Multiple Vitamin (MULTIVITAMIN WITH MINERALS) TABS tablet Take 1 tablet by mouth daily.    . pantoprazole (PROTONIX) 40 MG tablet Take 1 tablet (40 mg total) by mouth daily. 30 tablet 0  . oxyCODONE-acetaminophen (ROXICET) 5-325 MG tablet Take 1 tablet by mouth every 4 (four) hours as needed for moderate pain. (Patient not taking: Reported on 08/26/2015) 30 tablet 0   No current facility-administered medications for this visit.     Allergies  Allergen Reactions  . Lisinopril Cough     REVIEW OF SYSTEMS:  (Positives checked otherwise negative)  CARDIOVASCULAR:   [ ]   chest pain,  [ ]  chest pressure,  [ ]  palpitations,  [ ]  shortness of breath when laying flat,  [ ]  shortness of breath with exertion,   [ ]  pain in feet when walking,  [ ]  pain in feet when laying flat, [ ]  history of blood clot in veins (DVT),  [ ]  history of phlebitis,  [ ]  swelling in legs,  [ ]  varicose veins  PULMONARY:   [ ]  productive cough,  [ ]  asthma,  [ ]  wheezing  NEUROLOGIC:   [ ]  weakness in arms or legs,  [ ]  numbness in arms or legs,  [ ]  difficulty speaking or slurred speech,  [ ]  temporary loss of vision in one eye,  [ ]  dizziness  HEMATOLOGIC:   [ ]  bleeding problems,  [ ]  problems with blood clotting too easily  MUSCULOSKEL:   [ ]  joint  pain, [ ]  joint swelling  GASTROINTEST:   [ ]  vomiting blood,  [ ]  blood in stool     GENITOURINARY:   [ ]  burning with urination,  [ ]  blood in urine  PSYCHIATRIC:   [ ]  history of major depression  INTEGUMENTARY:   [ ]  rashes,  [ ]  ulcers  CONSTITUTIONAL:   [x]  fever,  [x]  chills   Physical Examination  Filed Vitals:   11/06/15 1404  BP: 123/79  Pulse: 102  Temp: 98.1 F (36.7 C)  TempSrc: Oral  Resp: 18  Height: 6' (1.829 m)  Weight: 171 lb (77.565 kg)  SpO2: 100%   Body mass index is 23.19 kg/(m^2).  General: A&O x 3, WD, WN  Pulmonary: Sym exp, good air movt, CTAB, no rales, rhonchi, & wheezing  Cardiac: RRR, Nl S1, S2, no Murmurs, rubs or gallops  Vascular:  Faintly palpable PT in L foot, visibly pulsatile mass in L groin without any skin thinning, +bruit, +thrill in L thigh AVG, no erythema, no TTP  Gastrointestinal: soft, NTND, no G/R, bo HSM, no masses, no CVAT B  Musculoskeletal: M/S 5/5 throughout , Extremities without  ischemic changes  Neurologic: Pain and light touch intact in extremities , Motor exam as listed above  Non-Invasive Vascular Imaging  left thigh AVG Duplex  (Date: 11/06/2015):   PSA measuring 4.97 cm   Medical Decision Making  [name] is a 45 y.o. male who presents with PSA at site of prior thigh AVG revision   No evidence of imminent rupture and no fever today to justify immediate intervention, though has some sx concerning for bacteremia.  Will schedule patient with next available attending this coming Monday for exploration of Left groin, possible repair of left thigh AVG, possible excision of L thigh AVG and placement of TDC if excision is necessary. Risk, benefits, and alternatives to access surgery were discussed.   The patient is aware the risks include but are not limited to: bleeding, infection, steal syndrome, nerve damage, ischemic monomelic neuropathy, failure to mature, need for additional  procedures, death and stroke.   The patient is aware the risks of tunneled dialysis catheter placement include but are not limited to: bleeding, infection, central venous injury, pneumothorax, possible venous stenosis, possible malpositioning in the venous system, and possible infections related to long-term catheter presence.  The patient agrees to proceed forward with the procedure.   Adele Barthel, MD [facility] Office: [phone] Pager: [phone]  11/06/2015, 2:45 PM

## 2015-11-06 NOTE — Progress Notes (Signed)
Unable to reach pt by phone for pre-op call. Left pre-op instructions according to the Pre-op Call Checklist, with time of arrival of 5:30 AM on pt's voicemail.

## 2015-11-08 MED ORDER — SODIUM CHLORIDE 0.9 % IV SOLN
INTRAVENOUS | Status: DC
  Administered 2015-11-09 (×2): via INTRAVENOUS

## 2015-11-08 MED ORDER — CHLORHEXIDINE GLUCONATE CLOTH 2 % EX PADS: 6.0000 | MEDICATED_PAD | Freq: Once | CUTANEOUS | Status: DC

## 2015-11-08 MED ORDER — DEXTROSE 5 % IV SOLN
1.5000 g | INTRAVENOUS | Status: AC
  Administered 2015-11-09: 1.5 g via INTRAVENOUS
  Filled 2015-11-08: qty 1.5

## 2015-11-09 ENCOUNTER — Ambulatory Visit (HOSPITAL_COMMUNITY): Payer: Medicare Other | Admitting: Anesthesiology

## 2015-11-09 ENCOUNTER — Encounter (HOSPITAL_COMMUNITY): Payer: Self-pay | Admitting: *Deleted

## 2015-11-09 ENCOUNTER — Inpatient Hospital Stay (HOSPITAL_COMMUNITY)
Admission: RE | Admit: 2015-11-09 | Discharge: 2015-11-10 | DRG: 252 | Disposition: A | Discharge status: Home / Self Care | Payer: Medicare Other | Source: Ambulatory Visit | Attending: Vascular Surgery | Admitting: Vascular Surgery

## 2015-11-09 ENCOUNTER — Encounter (HOSPITAL_COMMUNITY)
Admission: RE | Disposition: A | Discharge status: Home / Self Care | Payer: Self-pay | Source: Ambulatory Visit | Attending: Vascular Surgery

## 2015-11-09 DIAGNOSIS — I120 Hypertensive chronic kidney disease with stage 5 chronic kidney disease or end stage renal disease: Secondary | ICD-10-CM | POA: Diagnosis present

## 2015-11-09 DIAGNOSIS — E889 Metabolic disorder, unspecified: Secondary | ICD-10-CM | POA: Diagnosis present

## 2015-11-09 DIAGNOSIS — I724 Aneurysm of artery of lower extremity: Secondary | ICD-10-CM

## 2015-11-09 DIAGNOSIS — T82898A Other specified complication of vascular prosthetic devices, implants and grafts, initial encounter: Principal | ICD-10-CM | POA: Diagnosis present

## 2015-11-09 DIAGNOSIS — Y832 Surgical operation with anastomosis, bypass or graft as the cause of abnormal reaction of the patient, or of later complication, without mention of misadventure at the time of the procedure: Secondary | ICD-10-CM | POA: Diagnosis present

## 2015-11-09 DIAGNOSIS — Z992 Dependence on renal dialysis: Secondary | ICD-10-CM

## 2015-11-09 DIAGNOSIS — D649 Anemia, unspecified: Secondary | ICD-10-CM | POA: Diagnosis not present

## 2015-11-09 DIAGNOSIS — N186 End stage renal disease: Secondary | ICD-10-CM

## 2015-11-09 DIAGNOSIS — Z87891 Personal history of nicotine dependence: Secondary | ICD-10-CM

## 2015-11-09 DIAGNOSIS — R509 Fever, unspecified: Secondary | ICD-10-CM | POA: Diagnosis present

## 2015-11-09 HISTORY — PX: FALSE ANEURYSM REPAIR: SHX5152

## 2015-11-09 HISTORY — PX: ARTERIOVENOUS GRAFT PLACEMENT: SUR1029

## 2015-11-09 HISTORY — PX: PSEUDOANEURYSM REPAIR: SHX2272

## 2015-11-09 LAB — POCT I-STAT 4, (NA,K, GLUC, HGB,HCT)
Glucose, Bld: 77 mg/dL (ref 65–99)
HEMATOCRIT: 36 % — AB (ref 39.0–52.0)
HEMOGLOBIN: 12.2 g/dL — AB (ref 13.0–17.0)
POTASSIUM: 5.6 mmol/L — AB (ref 3.5–5.1)
Sodium: 139 mmol/L (ref 135–145)

## 2015-11-09 SURGERY — REPAIR, PSEUDOANEURYSM
Anesthesia: General | Site: Leg Upper | Laterality: Left

## 2015-11-09 MED ORDER — LABETALOL HCL 5 MG/ML IV SOLN: 10.0000 mg | INTRAVENOUS | Status: DC | PRN

## 2015-11-09 MED ORDER — HYDRALAZINE HCL 20 MG/ML IJ SOLN: 5.0000 mg | INTRAMUSCULAR | Status: DC | PRN

## 2015-11-09 MED ORDER — ONDANSETRON HCL 4 MG/2ML IJ SOLN
INTRAMUSCULAR | Status: AC
  Filled 2015-11-09: qty 2

## 2015-11-09 MED ORDER — DEXTROSE 5 % IV SOLN
1.5000 g | Freq: Once | INTRAVENOUS | Status: AC
  Administered 2015-11-10: 1.5 g via INTRAVENOUS
  Filled 2015-11-09: qty 1.5

## 2015-11-09 MED ORDER — SODIUM CHLORIDE 0.9 % IV SOLN: 62.5000 mg | INTRAVENOUS | Status: DC

## 2015-11-09 MED ORDER — PROTAMINE SULFATE 10 MG/ML IV SOLN
INTRAVENOUS | Status: DC | PRN
  Administered 2015-11-09 (×2): 50 mg via INTRAVENOUS

## 2015-11-09 MED ORDER — ONDANSETRON HCL 4 MG/2ML IJ SOLN
INTRAMUSCULAR | Status: DC | PRN
  Administered 2015-11-09: 4 mg via INTRAVENOUS

## 2015-11-09 MED ORDER — HYDROCODONE-ACETAMINOPHEN 5-325 MG PO TABS
ORAL_TABLET | ORAL | Status: AC
  Filled 2015-11-09: qty 1

## 2015-11-09 MED ORDER — ACETAMINOPHEN 325 MG PO TABS
325.0000 mg | ORAL_TABLET | ORAL | Status: DC | PRN
  Administered 2015-11-09: 650 mg via ORAL
  Filled 2015-11-09: qty 2

## 2015-11-09 MED ORDER — DOCUSATE SODIUM 100 MG PO CAPS
100.0000 mg | ORAL_CAPSULE | Freq: Every day | ORAL | Status: DC
  Administered 2015-11-10: 100 mg via ORAL
  Filled 2015-11-09: qty 1

## 2015-11-09 MED ORDER — PROPOFOL 10 MG/ML IV BOLUS
INTRAVENOUS | Status: DC | PRN
Start: 2015-11-09 — End: 2015-11-09
  Administered 2015-11-09: 160 mg via INTRAVENOUS
  Administered 2015-11-09: 20 mg via INTRAVENOUS

## 2015-11-09 MED ORDER — MIDAZOLAM HCL 5 MG/5ML IJ SOLN
INTRAMUSCULAR | Status: DC | PRN
  Administered 2015-11-09: 2 mg via INTRAVENOUS

## 2015-11-09 MED ORDER — MORPHINE SULFATE (PF) 2 MG/ML IV SOLN: 2.0000 mg | INTRAVENOUS | Status: DC | PRN

## 2015-11-09 MED ORDER — POTASSIUM CHLORIDE CRYS ER 20 MEQ PO TBCR: 20.0000 meq | EXTENDED_RELEASE_TABLET | Freq: Every day | ORAL | Status: DC | PRN

## 2015-11-09 MED ORDER — SODIUM CHLORIDE 0.9 % IV SOLN: INTRAVENOUS | Status: DC

## 2015-11-09 MED ORDER — OXYCODONE HCL 5 MG PO TABS
5.0000 mg | ORAL_TABLET | ORAL | Status: DC | PRN
  Administered 2015-11-10 (×2): 10 mg via ORAL
  Filled 2015-11-09: qty 2

## 2015-11-09 MED ORDER — MIDAZOLAM HCL 2 MG/2ML IJ SOLN
INTRAMUSCULAR | Status: AC
Start: 2015-11-09 — End: 2015-11-09
  Filled 2015-11-09: qty 2

## 2015-11-09 MED ORDER — HYDROCODONE-ACETAMINOPHEN 7.5-325 MG PO TABS
ORAL_TABLET | ORAL | Status: AC
Start: 2015-11-09 — End: 2015-11-09
  Filled 2015-11-09: qty 1

## 2015-11-09 MED ORDER — THROMBIN 20000 UNITS EX SOLR
CUTANEOUS | Status: AC
  Filled 2015-11-09: qty 20000

## 2015-11-09 MED ORDER — ROCURONIUM BROMIDE 100 MG/10ML IV SOLN
INTRAVENOUS | Status: DC | PRN
  Administered 2015-11-09: 10 mg via INTRAVENOUS
  Administered 2015-11-09: 40 mg via INTRAVENOUS

## 2015-11-09 MED ORDER — SODIUM CHLORIDE 0.9 % IV SOLN: 500.0000 mL | Freq: Once | INTRAVENOUS | Status: DC | PRN

## 2015-11-09 MED ORDER — FENTANYL CITRATE (PF) 100 MCG/2ML IJ SOLN
25.0000 ug | INTRAMUSCULAR | Status: DC | PRN
  Administered 2015-11-09 (×3): 50 ug via INTRAVENOUS

## 2015-11-09 MED ORDER — GLYCOPYRROLATE 0.2 MG/ML IJ SOLN
INTRAMUSCULAR | Status: DC | PRN
  Administered 2015-11-09: .4 mg via INTRAVENOUS

## 2015-11-09 MED ORDER — ALTEPLASE 2 MG IJ SOLR
2.0000 mg | Freq: Once | INTRAMUSCULAR | Status: DC | PRN
  Filled 2015-11-09: qty 2

## 2015-11-09 MED ORDER — HEMOSTATIC AGENTS (NO CHARGE) OPTIME
TOPICAL | Status: DC | PRN
  Administered 2015-11-09: 1 via TOPICAL

## 2015-11-09 MED ORDER — PANTOPRAZOLE SODIUM 40 MG PO TBEC
40.0000 mg | DELAYED_RELEASE_TABLET | Freq: Every day | ORAL | Status: DC
  Administered 2015-11-09 – 2015-11-10 (×2): 40 mg via ORAL
  Filled 2015-11-09 (×2): qty 1

## 2015-11-09 MED ORDER — SODIUM CHLORIDE 0.9 % IV SOLN: 100.0000 mL | INTRAVENOUS | Status: DC | PRN

## 2015-11-09 MED ORDER — NEOSTIGMINE METHYLSULFATE 10 MG/10ML IV SOLN
INTRAVENOUS | Status: DC | PRN
  Administered 2015-11-09: 3 mg via INTRAVENOUS

## 2015-11-09 MED ORDER — ONDANSETRON HCL 4 MG/2ML IJ SOLN: 4.0000 mg | Freq: Four times a day (QID) | INTRAMUSCULAR | Status: DC | PRN

## 2015-11-09 MED ORDER — ACETAMINOPHEN 650 MG RE SUPP: 325.0000 mg | RECTAL | Status: DC | PRN

## 2015-11-09 MED ORDER — PHENOL 1.4 % MT LIQD: 1.0000 | OROMUCOSAL | Status: DC | PRN

## 2015-11-09 MED ORDER — LIDOCAINE-PRILOCAINE 2.5-2.5 % EX CREA
1.0000 "application " | TOPICAL_CREAM | CUTANEOUS | Status: DC | PRN
  Filled 2015-11-09: qty 5

## 2015-11-09 MED ORDER — FENTANYL CITRATE (PF) 100 MCG/2ML IJ SOLN
INTRAMUSCULAR | Status: AC
  Filled 2015-11-09: qty 2

## 2015-11-09 MED ORDER — DEXTROSE 5 % IV SOLN
10.0000 mg | INTRAVENOUS | Status: DC | PRN
  Administered 2015-11-09: 10 ug/min via INTRAVENOUS

## 2015-11-09 MED ORDER — LIDOCAINE HCL (CARDIAC) 20 MG/ML IV SOLN
INTRAVENOUS | Status: AC
  Filled 2015-11-09: qty 5

## 2015-11-09 MED ORDER — ALUM & MAG HYDROXIDE-SIMETH 200-200-20 MG/5ML PO SUSP: 15.0000 mL | ORAL | Status: DC | PRN

## 2015-11-09 MED ORDER — ROCURONIUM BROMIDE 50 MG/5ML IV SOLN
INTRAVENOUS | Status: AC
Start: 2015-11-09 — End: 2015-11-09
  Filled 2015-11-09: qty 1

## 2015-11-09 MED ORDER — MAGNESIUM SULFATE 2 GM/50ML IV SOLN
2.0000 g | Freq: Every day | INTRAVENOUS | Status: DC | PRN
  Filled 2015-11-09: qty 50

## 2015-11-09 MED ORDER — HYDROCODONE-ACETAMINOPHEN 7.5-325 MG PO TABS
1.0000 | ORAL_TABLET | Freq: Once | ORAL | Status: AC | PRN
  Administered 2015-11-09: 1 via ORAL

## 2015-11-09 MED ORDER — LIDOCAINE HCL (CARDIAC) 20 MG/ML IV SOLN
INTRAVENOUS | Status: DC | PRN
  Administered 2015-11-09: 60 mg via INTRAVENOUS

## 2015-11-09 MED ORDER — SODIUM CHLORIDE 0.9 % IV SOLN
INTRAVENOUS | Status: DC | PRN
  Administered 2015-11-09: 500 mL

## 2015-11-09 MED ORDER — SENNOSIDES-DOCUSATE SODIUM 8.6-50 MG PO TABS: 1.0000 | ORAL_TABLET | Freq: Every evening | ORAL | Status: DC | PRN

## 2015-11-09 MED ORDER — 0.9 % SODIUM CHLORIDE (POUR BTL) OPTIME
TOPICAL | Status: DC | PRN
  Administered 2015-11-09: 2000 mL

## 2015-11-09 MED ORDER — PROPOFOL 10 MG/ML IV BOLUS
INTRAVENOUS | Status: AC
  Filled 2015-11-09: qty 20

## 2015-11-09 MED ORDER — HEPARIN SODIUM (PORCINE) 1000 UNIT/ML IJ SOLN
INTRAMUSCULAR | Status: DC | PRN
  Administered 2015-11-09: 5000 [IU] via INTRAVENOUS

## 2015-11-09 MED ORDER — BISACODYL 5 MG PO TBEC: 5.0000 mg | DELAYED_RELEASE_TABLET | Freq: Every day | ORAL | Status: DC | PRN

## 2015-11-09 MED ORDER — FENTANYL CITRATE (PF) 250 MCG/5ML IJ SOLN
INTRAMUSCULAR | Status: AC
  Filled 2015-11-09: qty 5

## 2015-11-09 MED ORDER — METOPROLOL TARTRATE 1 MG/ML IV SOLN: 2.0000 mg | INTRAVENOUS | Status: DC | PRN

## 2015-11-09 MED ORDER — DEXTROSE 5 % IV SOLN
1.5000 g | Freq: Two times a day (BID) | INTRAVENOUS | Status: DC
  Filled 2015-11-09 (×2): qty 1.5

## 2015-11-09 MED ORDER — HEPARIN SODIUM (PORCINE) 1000 UNIT/ML DIALYSIS: 1000.0000 [IU] | INTRAMUSCULAR | Status: DC | PRN

## 2015-11-09 MED ORDER — VECURONIUM BROMIDE 10 MG IV SOLR
INTRAVENOUS | Status: DC | PRN
  Administered 2015-11-09: 2 mg via INTRAVENOUS

## 2015-11-09 MED ORDER — LIDOCAINE HCL (PF) 1 % IJ SOLN: 5.0000 mL | INTRAMUSCULAR | Status: DC | PRN

## 2015-11-09 MED ORDER — PENTAFLUOROPROP-TETRAFLUOROETH EX AERO: 1.0000 "application " | INHALATION_SPRAY | CUTANEOUS | Status: DC | PRN

## 2015-11-09 MED ORDER — GUAIFENESIN-DM 100-10 MG/5ML PO SYRP: 15.0000 mL | ORAL_SOLUTION | ORAL | Status: DC | PRN

## 2015-11-09 MED ORDER — FENTANYL CITRATE (PF) 100 MCG/2ML IJ SOLN
INTRAMUSCULAR | Status: DC | PRN
  Administered 2015-11-09 (×5): 50 ug via INTRAVENOUS

## 2015-11-09 MED ORDER — PROMETHAZINE HCL 25 MG/ML IJ SOLN: 6.2500 mg | INTRAMUSCULAR | Status: DC | PRN

## 2015-11-09 SURGICAL SUPPLY — 81 items
APL SKNCLS STERI-STRIP NONHPOA (GAUZE/BANDAGES/DRESSINGS) ×2
BAG DECANTER FOR FLEXI CONT (MISCELLANEOUS) ×4 IMPLANT
BANDAGE ESMARK 6X9 LF (GAUZE/BANDAGES/DRESSINGS) IMPLANT
BENZOIN TINCTURE PRP APPL 2/3 (GAUZE/BANDAGES/DRESSINGS) ×4 IMPLANT
BIOPATCH RED 1 DISK 7.0 (GAUZE/BANDAGES/DRESSINGS) ×3 IMPLANT
BIOPATCH RED 1IN DISK 7.0MM (GAUZE/BANDAGES/DRESSINGS) ×1
BNDG CMPR 9X6 STRL LF SNTH (GAUZE/BANDAGES/DRESSINGS)
BNDG ESMARK 6X9 LF (GAUZE/BANDAGES/DRESSINGS)
CANISTER SUCTION 2500CC (MISCELLANEOUS) ×4 IMPLANT
CANNULA VESSEL 3MM 2 BLNT TIP (CANNULA) IMPLANT
CATH EMB 4FR 40CM (CATHETERS) ×6 IMPLANT
CATH PALINDROME RT-P 15FX19CM (CATHETERS) IMPLANT
CATH PALINDROME RT-P 15FX23CM (CATHETERS) IMPLANT
CATH PALINDROME RT-P 15FX28CM (CATHETERS) IMPLANT
CATH PALINDROME RT-P 15FX55CM (CATHETERS) IMPLANT
CLIP LIGATING EXTRA MED SLVR (CLIP) ×4 IMPLANT
CLIP LIGATING EXTRA SM BLUE (MISCELLANEOUS) ×4 IMPLANT
CLOSURE STERI-STRIP 1/2X4 (GAUZE/BANDAGES/DRESSINGS) ×1
CLOSURE WOUND 1/2 X4 (GAUZE/BANDAGES/DRESSINGS) ×1
CLSR STERI-STRIP ANTIMIC 1/2X4 (GAUZE/BANDAGES/DRESSINGS) ×2 IMPLANT
COVER PROBE W GEL 5X96 (DRAPES) IMPLANT
CUFF TOURNIQUET SINGLE 18IN (TOURNIQUET CUFF) IMPLANT
CUFF TOURNIQUET SINGLE 24IN (TOURNIQUET CUFF) IMPLANT
CUFF TOURNIQUET SINGLE 34IN LL (TOURNIQUET CUFF) IMPLANT
CUFF TOURNIQUET SINGLE 44IN (TOURNIQUET CUFF) IMPLANT
DECANTER SPIKE VIAL GLASS SM (MISCELLANEOUS) ×4 IMPLANT
DRAIN SNY 10X20 3/4 PERF (WOUND CARE) IMPLANT
DRAPE C-ARM 42X72 X-RAY (DRAPES) ×4 IMPLANT
DRAPE CHEST BREAST 15X10 FENES (DRAPES) ×4 IMPLANT
DRAPE X-RAY CASS 24X20 (DRAPES) IMPLANT
DRSG COVADERM 4X8 (GAUZE/BANDAGES/DRESSINGS) IMPLANT
ELECT REM PT RETURN 9FT ADLT (ELECTROSURGICAL) ×4
ELECTRODE REM PT RTRN 9FT ADLT (ELECTROSURGICAL) ×2 IMPLANT
EVACUATOR SILICONE 100CC (DRAIN) IMPLANT
GAUZE SPONGE 2X2 8PLY STRL LF (GAUZE/BANDAGES/DRESSINGS) ×2 IMPLANT
GAUZE SPONGE 4X4 16PLY XRAY LF (GAUZE/BANDAGES/DRESSINGS) ×4 IMPLANT
GLOVE BIOGEL PI IND STRL 6.5 (GLOVE) ×1 IMPLANT
GLOVE BIOGEL PI INDICATOR 6.5 (GLOVE) ×2
GLOVE SS BIOGEL STRL SZ 7.5 (GLOVE) ×2 IMPLANT
GLOVE SUPERSENSE BIOGEL SZ 7.5 (GLOVE) ×2
GOWN STRL REUS W/ TWL LRG LVL3 (GOWN DISPOSABLE) ×6 IMPLANT
GOWN STRL REUS W/TWL LRG LVL3 (GOWN DISPOSABLE) ×12
GRAFT GORETEX 6X10 (Vascular Products) ×3 IMPLANT
HEMOSTAT SNOW SURGICEL 2X4 (HEMOSTASIS) ×3 IMPLANT
KIT BASIN OR (CUSTOM PROCEDURE TRAY) ×4 IMPLANT
KIT ROOM TURNOVER OR (KITS) ×4 IMPLANT
NDL 18GX1X1/2 (RX/OR ONLY) (NEEDLE) ×1 IMPLANT
NDL HYPO 25GX1X1/2 BEV (NEEDLE) ×1 IMPLANT
NEEDLE 18GX1X1/2 (RX/OR ONLY) (NEEDLE) ×4 IMPLANT
NEEDLE 22X1 1/2 (OR ONLY) (NEEDLE) ×4 IMPLANT
NEEDLE HYPO 25GX1X1/2 BEV (NEEDLE) ×4 IMPLANT
NS IRRIG 1000ML POUR BTL (IV SOLUTION) ×8 IMPLANT
PACK PERIPHERAL VASCULAR (CUSTOM PROCEDURE TRAY) ×4 IMPLANT
PACK SURGICAL SETUP 50X90 (CUSTOM PROCEDURE TRAY) ×4 IMPLANT
PAD ARMBOARD 7.5X6 YLW CONV (MISCELLANEOUS) ×8 IMPLANT
PADDING CAST COTTON 6X4 STRL (CAST SUPPLIES) IMPLANT
SET COLLECT BLD 21X3/4 12 (NEEDLE) IMPLANT
SOAP 2 % CHG 4 OZ (WOUND CARE) ×4 IMPLANT
SPONGE GAUZE 2X2 STER 10/PKG (GAUZE/BANDAGES/DRESSINGS) ×2
SPONGE GAUZE 4X4 12PLY STER LF (GAUZE/BANDAGES/DRESSINGS) ×3 IMPLANT
STAPLER VISISTAT 35W (STAPLE) IMPLANT
STOPCOCK 4 WAY LG BORE MALE ST (IV SETS) ×6 IMPLANT
STRIP CLOSURE SKIN 1/2X4 (GAUZE/BANDAGES/DRESSINGS) ×3 IMPLANT
SUT ETHILON 3 0 PS 1 (SUTURE) ×4 IMPLANT
SUT PROLENE 5 0 C 1 24 (SUTURE) ×6 IMPLANT
SUT PROLENE 5 0 C 1 36 (SUTURE) ×3 IMPLANT
SUT PROLENE 6 0 CC (SUTURE) ×9 IMPLANT
SUT VIC AB 2-0 CTX 36 (SUTURE) ×4 IMPLANT
SUT VIC AB 3-0 SH 27 (SUTURE) ×4
SUT VIC AB 3-0 SH 27X BRD (SUTURE) ×2 IMPLANT
SUT VICRYL 4-0 PS2 18IN ABS (SUTURE) ×4 IMPLANT
SYR 20CC LL (SYRINGE) ×4 IMPLANT
SYR 3ML LL SCALE MARK (SYRINGE) ×6 IMPLANT
SYR 5ML LL (SYRINGE) ×8 IMPLANT
SYR CONTROL 10ML LL (SYRINGE) ×4 IMPLANT
SYRINGE 10CC LL (SYRINGE) ×4 IMPLANT
TAPE CLOTH SURG 4X10 WHT LF (GAUZE/BANDAGES/DRESSINGS) ×3 IMPLANT
TRAY FOLEY W/METER SILVER 16FR (SET/KITS/TRAYS/PACK) ×4 IMPLANT
TUBING EXTENTION W/L.L. (IV SETS) IMPLANT
UNDERPAD 30X30 INCONTINENT (UNDERPADS AND DIAPERS) ×4 IMPLANT
WATER STERILE IRR 1000ML POUR (IV SOLUTION) ×4 IMPLANT

## 2015-11-09 NOTE — Transfer of Care (Signed)
Immediate Anesthesia Transfer of Care Note  Patient: [name]  Procedure(s) Performed: Procedure(s): REPAIR OF LEFT FEMORAL PSEUDOANEURYSM; REVISION  OF LEFT THIGH ARTERIOVENOUS GRAFT USING 6MM X 10 CM GORETEX GRAFT  (Left)  Patient Location: PACU  Anesthesia Type:General  Level of Consciousness: awake, alert  and oriented  Airway & Oxygen Therapy: Patient Spontanous Breathing and Patient connected to nasal cannula oxygen  Post-op Assessment: Report given to RN and Post -op Vital signs reviewed and stable  Post vital signs: Reviewed and stable  Last Vitals:  Filed Vitals:   11/09/15 0721  BP: 146/93  Pulse: 100  Temp: [id number] C    Complications: No apparent anesthesia complications

## 2015-11-09 NOTE — Progress Notes (Signed)
     Patient is on HD via hil left thigh graft.  Dr. Jonnie Finner.  He will be admitted to [facility].  You may stick the graft below the incision.     Loletta Harper MAUREEN PA-C

## 2015-11-09 NOTE — Anesthesia Postprocedure Evaluation (Signed)
Anesthesia Post Note  Patient: [name]  Procedure(s) Performed: Procedure(s) (LRB): REPAIR OF LEFT FEMORAL PSEUDOANEURYSM; REVISION  OF LEFT THIGH ARTERIOVENOUS GRAFT USING 6MM X 10 CM GORETEX GRAFT  (Left)  Patient location during evaluation: PACU Anesthesia Type: General Level of consciousness: awake and alert Pain management: pain level controlled Vital Signs Assessment: post-procedure vital signs reviewed and stable Respiratory status: spontaneous breathing, nonlabored ventilation, respiratory function stable and patient connected to nasal cannula oxygen Cardiovascular status: blood pressure returned to baseline and stable Postop Assessment: no signs of nausea or vomiting Anesthetic complications: no    Last Vitals:  Filed Vitals:   11/09/15 1100 11/09/15 1115  BP: 125/60 128/81  Pulse: 81 69  Temp:    Resp: 12 19    Last Pain:  Filed Vitals:   11/09/15 1117  PainSc: 3                  Zenaida Deed

## 2015-11-09 NOTE — Anesthesia Procedure Notes (Addendum)
Procedure Name: Intubation Date/Time: 11/09/2015 7:50 AM Performed by: Clearnce Sorrel Pre-anesthesia Checklist: Patient identified, Emergency Drugs available, Suction available, Patient being monitored and Timeout performed Patient Re-evaluated:Patient Re-evaluated prior to inductionOxygen Delivery Method: Circle system utilized Preoxygenation: Pre-oxygenation with 100% oxygen Intubation Type: IV induction Ventilation: Mask ventilation without difficulty Laryngoscope Size: Mac, Glidescope and 4 Grade View: Grade III Tube type: Oral Tube size: 7.5 mm Number of attempts: 2 Airway Equipment and Method: Stylet Placement Confirmation: ETT inserted through vocal cords under direct vision,  positive ETCO2 and breath sounds checked- equal and bilateral Secured at: 24 cm Tube secured with: Tape Dental Injury: Teeth and Oropharynx as per pre-operative assessment

## 2015-11-09 NOTE — Consult Note (Signed)
Renal Service Consult Note [facility]  Novian Schueneman [facility] 11/09/2015 [place] Requesting Physician:  Dr Donnetta Hutching  Reason for Consult:  ESRD pt s/p access surgery HPI: The patient is a 45 y.o. 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  Surgery done today and postop only c/o is pain.  No sob, cough or edema.  Needs HD today.   Patient had parathyroidectomy in 2015 , has had low PTH since then, takes CaCO3 (tums) as Ca supplement.      ROS  no SOB  no cough  no CP  no abd pain  no n/v/d  no rash or itching  Past Medical History  Past Medical History  Diagnosis Date  . Hypertension   . Anemia   . Thyroid disease   . Cough     DRY    . Headache(784.0)   . Muscle spasms of neck     BACK, NECK  . Depression   . ESRD on hemodialysis ([place])     HD [facility] MWF   Past Surgical History  Past Surgical History  Procedure Laterality Date  . Arteriovenous graft placement Left     "forearm, it's not working; thigh"   . Parathyroidectomy N/A 04/24/2014    Procedure: TOTAL 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;  Surgeon: [name], MD;  Location: [place];  Service: General;  Laterality: N/A;  NECK AND LEFT FOREARM  . Thrombectomy / arteriovenous graft revision Left 08/18/2015    thigh  . Thrombectomy w/ embolectomy Left 08/18/2015    Procedure: THROMBECTOMY  AND REVISION ARTERIOVENOUS GORE-TEX GRAFT/LEFT THIGH;  Surgeon: [name], MD;  Location: [place];  Service: Vascular;  Laterality: Left;  . Thrombectomy and revision of arterioventous (av) goretex  graft Left 08/23/2015    Procedure: THROMBECTOMY AND REVISION OF ARTERIOVENTOUS (AV) GORETEX  GRAFT LEFT THIGH ;  Surgeon: [name], MD;  Location: [place];  Service: Vascular;  Laterality: Left;   Family History No family history on file. Social History  reports that he quit smoking about 29 years ago. He  has never used smokeless tobacco. He reports that he does not drink alcohol or use illicit drugs. Allergies  Allergies  Allergen Reactions  . Lisinopril Cough   Home medications Prior to Admission medications   Medication Sig Start Date End Date Taking? Authorizing Provider  acetaminophen (TYLENOL) 500 MG tablet Take 1,000 mg by mouth every 6 (six) hours as needed for pain. For pain   Yes Historical Provider, MD  calcium carbonate (TUMS - DOSED IN MG ELEMENTAL CALCIUM) 500 MG chewable tablet Chew 2 tablets (400 mg of elemental calcium total) by mouth 2 (two) times daily between meals. 04/26/14  Yes Judeth Horn, MD  oxyCODONE-acetaminophen (ROXICET) 5-325 MG tablet Take 1 tablet by mouth every 4 (four) hours as needed for moderate pain. Patient not taking: Reported on 08/26/2015 08/23/15   Aldona Bar J Rhyne, PA-C  pantoprazole (PROTONIX) 40 MG tablet Take 1 tablet (40 mg total) by mouth daily. Patient not taking: Reported on 11/09/2015 08/27/15   Shela Leff, MD   Liver Function Tests No results for input(s): AST, ALT, ALKPHOS, BILITOT, PROT, ALBUMIN in the last 168 hours. No results for input(s): LIPASE, AMYLASE in the last 168 hours. CBC  Recent Labs Lab 11/09/15 0722  HGB 12.2*  HCT [id number]*   Basic Metabolic Panel  Recent Labs Lab 11/09/15 [phone]  NA 139  K 5.6*  GLUCOSE 77    Filed Vitals:   11/09/15 1215 11/09/15 1230 11/09/15 1245 11/09/15 1300  BP: 108/51 121/65 123/81 122/67  Pulse: 65 66 69 69  Temp:      TempSrc:      Resp: 13 12 17 15   Weight:      SpO2: 100% 100% 100% 100%   Exam Alert no distress postop No rash, cyanosis or gangrene Sclera anicteric, throat clear No jvd Chest clear bilat RRR no MRG ABd soft ntnd no mass or ascites L thigh AVG +bruit Old access LUE Neuro alert ox 3    D[facility]: [facility] M[facility]   3h 69min 75kg   2/ 2.5 bath  P4  Hep 1500  L thigh AVG 10.6/ 32%/ 916 > darbe 25 qwk last 2/1, venofer 50/wk 9.3/ 6.6/ 12 pth > tums 6 ac tid,  hx parathyroidectomy Jul '15 HTN no bp meds/ BP's good on HD/ comp good  Assessment: 1 S/P revision L thigh AV graft, ok to use 2 ESRD HD MWF 3 Vol no excess vol on exam, up 2kg 4 HTN no meds at home 5 Anemia next esa 2/8 6 MBD hx ptx, tums 6 ac   Plan - HD today upstairs, use AVG away from surg site  Kelly Splinter MD [place] pager [phone]    cell [phone] 11/09/2015, 1:39 PM

## 2015-11-09 NOTE — Progress Notes (Signed)
Called for 2nd assess for PIV. Lights in room not working. Notified primary [place] RN to reenter consult once appropriate lighting was obtained.

## 2015-11-09 NOTE — Progress Notes (Signed)
Admission note:  Arrival Method: Patient came from PACU. Mental Orientation:  Alert and oriented x 4. Telemetry: 6E-01, NSR Assessment: See doc flow sheets. Skin: Warm, dry and intact, no open areas noted.  Guaze dressing noted on the left upper thigh graft.  Assessed by two nurses, Genelle Bal). IV: Right forearm saline lock. Pain: 3/10, given Tylenol 650 mg. Tubes: N/A Safety Measures: Bed in low position, call bell and phone within reach. Fall Prevention Safety Plan: Explained to the patient about the fall prevention plan, patient understood. Admission Screening: In progress 6700 Orientation: Patient has been oriented to the unit, staff and to the room.

## 2015-11-09 NOTE — Interval H&P Note (Signed)
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 After consideration of risks, benefits and other options for treatment, the patient has consented to  Procedure(s): REPAIR LEFT FEMORAL PSEUDOANEURYSM; POSSIBLE EXCISION OF LEFT THIGH AVG  (Left) POSSIBLE INSERTION OF DIALYSIS CATHETER (N/A) as a surgical intervention .  The patient's history has been reviewed, patient examined, no change in status, stable for surgery.  I have reviewed the patient's chart and labs.  Questions were answered to the patient's satisfaction.     Curt Jews

## 2015-11-09 NOTE — Op Note (Signed)
OPERATIVE REPORT  DATE OF SURGERY: 11/09/2015  PATIENT: [name], 45 y.o. 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  PHYSICIAN ASSISTANT: Collins PA-C  ANESTHESIA:  Gen.  EBL: 400 ml  Total I/O In: 500 [I.V.:500] Out: 400 [Blood:400]  BLOOD ADMINISTERED: None  DRAINS: None  SPECIMEN: None  COUNTS CORRECT:  YES  PLAN OF CARE: PACU   PATIENT DISPOSITION:  PACU - hemodynamically stable  PROCEDURE DETAILS: Patient's 45 year old gentleman who does have access to his left femoral loop graft for at least 4 years. He had multiple prior thromboliasis events but no surgical revision until November 2016. He had thromboses and on November 15 had a thrombectomy and revision of his venous anastomosis. He had Dhiren Azimi reocclusion and went back and had thrombectomy and replacement of the lateral half of his loop which was the arterial half. He had an end-to-side anastomosis common femoral artery. The patient has done well since then. He presented to the office with increasingly large mass in his left groin. He does report some fevers but no specific erythema or tenderness over the large false aneurysm. He is to the operating for repair. Explained that if this wasn't effective he would have to have removal of this and placement of the catheter.  The patient was taken to the operating placed in position where the area of the left groin left thigh were prepped and draped in usual sterile fashion to the prior incision at the groin crease. The patient had a very large false aneurysm. Dissection was continued down staying out of the false her aneurysm to the level of inguinal ligament. The external iliac artery was exposed above the false aneurysm. This was a  controlled level to be able to occlude this. This did remove flow from a pulsatile nature and the false aneurysm was occluded. Dissection was then continued down towards the venous portion of the Gore-Tex graft more medial and distal and the thigh. This was carried down to the level of where the graft could be exposed on the outside of false aneurysm. Patient was given 5000 units intravenous heparin and after adequate circulation time occluded with a Hartmann clamp and the graft is closed occluded with angled DeBakey clamp. The false aneurysm was entered and there was continued bleeding as expected from the distal common femoral artery. This was initially controlled with digital pressure. A 4 Fogarty catheter with stopcock was used for proximal distal control of the common femoral artery. The prior interposition graft could pulled away completely from the femoral anastomosis. It looked as though the suture was fractured was no evidence of purulence. There was a very large amount of mural thrombus suggesting this present for quite some time. The common femoral artery was exposed above the prior anastomosis. The edges appeared to be intact of the common femoral artery. A new interposition graft with 6 mm Gore-Tex was brought onto the field and was cut by slightly spatulated. This was sewn end-to-side to the common femoral artery with a 5-0 Prolene sutures. Large bites were taken of the artery to include the arterial wall and some surrounding tissue. This anastomosis was tested and found to be adequate. The old graft that had pulled away was transected new graft was cut to appropriate length and was sewn end-to-end  to the old graft with a running 6-0 Prolene suture. Prior to completion of the anastomosis the venous portion was allowed to back bleed and the arterial portion was flushed. Needed and clamps removed with excellent flow in the graft. The patient was given 100 mg of protamine to reverse heparin. Hemostasis  tablet cautery. The wounds were closed with 2-0 Vicryl in several layers and the subcutaneous tissue. Skin was closed with 3-0 subcuticular Vicryl stitch. Sterile dressing was applied and the patient was transferred to the recovery room in stable condition   Curt Jews, M.D. 11/09/2015 10:41 AM

## 2015-11-09 NOTE — Anesthesia Preprocedure Evaluation (Signed)
Anesthesia Evaluation  Patient identified by MRN, date of birth, ID band Patient awake    Reviewed: Allergy & Precautions, H&P , NPO status , Patient's Chart, lab work & pertinent test results  Airway Mallampati: II  TM Distance: >3 FB Neck ROM: Full    Dental no notable dental hx. (+) Teeth Intact, Dental Advisory Given   Pulmonary former smoker,    Pulmonary exam normal breath sounds clear to auscultation       Cardiovascular hypertension, Pt. on medications  Rhythm:Regular Rate:Normal  2007 Echo with dilated LV, normal EF, mild AI  Denies signs or symptoms of heart failure today or angina   Neuro/Psych  Headaches, PSYCHIATRIC DISORDERS Depression  C/o hx of muscle spasm of back and neck  Neuromuscular disease    GI/Hepatic negative GI ROS, Neg liver ROS,   Endo/Other  negative endocrine ROS  Renal/GU ESRF and DialysisRenal diseaseDialysis MWF For polycystic kidney disease  negative genitourinary   Musculoskeletal   Abdominal   Peds  Hematology negative hematology ROS (+) Blood dyscrasia, anemia ,   Anesthesia Other Findings   Reproductive/Obstetrics negative OB ROS                             CBC Latest Ref Rng 08/26/2015 08/26/2015 08/23/2015  WBC 4.0 - 10.5 K/uL - 8.9 -  Hemoglobin 13.0 - 17.0 g/dL 11.9(L) 11.7(L) 13.3  Hematocrit 39.0 - 52.0 % 35.0(L) 33.4(L) 39.0  Platelets 150 - 400 K/uL - 217 -   Lab Results  Component Value Date   CREATININE 10.20* 08/26/2015     Chemistry      Component Value Date/Time   NA 135 08/26/2015 1615   K 4.6 08/26/2015 1615   CL 97* 08/26/2015 1615   CO2 28 08/26/2015 1600   BUN 51* 08/26/2015 1615   CREATININE 10.20* 08/26/2015 1615      Component Value Date/Time   CALCIUM 8.8* 08/26/2015 1600   CALCIUM 8.7 02/16/2010 0827   ALKPHOS 62 08/26/2015 1600   AST 25 08/26/2015 1600   ALT 5* 08/26/2015 1600   BILITOT 0.7 08/26/2015 1600     BP Readings from Last 3 Encounters:  11/06/15 123/79  08/27/15 100/64  08/23/15 117/74   EKG 08/18/15: Normal sinus rhythm Possible Left atrial enlargement  Anesthesia Physical  Anesthesia Plan  ASA: III  Anesthesia Plan: General   Post-op Pain Management:    Induction: Intravenous  Airway Management Planned: Oral ETT  Additional Equipment:   Intra-op Plan:   Post-operative Plan: Extubation in OR  Informed Consent: I have reviewed the patients History and Physical, chart, labs and discussed the procedure including the risks, benefits and alternatives for the proposed anesthesia with the patient or authorized representative who has indicated his/her understanding and acceptance.   Dental advisory given  Plan Discussed with: CRNA and Anesthesiologist  Anesthesia Plan Comments: (GA with ETT given likely length of procedure  Will check DOS electrolytes given Dialysis - last done: )        Anesthesia Quick Evaluation

## 2015-11-09 NOTE — H&P (View-Only) (Signed)
Established Dialysis Access  History of Present Illness  [name] is a 45 y.o. (1971/03/06) male who presents for evaluation of large pulse in left groin.  This patient notes onset of big pulsatile mass in left groin ~2 weeks ago.  He is s/p T&R L thigh AVG on 08/23/15 with Dr. Scot Dock.  The patient denies any complications initially from that surgery.  He had been dialyzing without difficulty.  He notes some "fever" and chills over the last few days.  Reportedly he has been given antibiotic on HD.  Past Medical History  Diagnosis Date  . Hypertension   . Anemia   . Thyroid disease   . Cough     DRY    . Headache(784.0)   . Muscle spasms of neck     BACK, NECK  . Depression   . ESRD on hemodialysis ([place])     HD [facility] MWF    Past Surgical History  Procedure Laterality Date  . Arteriovenous graft placement Left     "forearm, it's not working; thigh"   . Parathyroidectomy N/A 04/24/2014    Procedure: TOTAL 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;  Surgeon: [name], MD;  Location: [place];  Service: General;  Laterality: N/A;  NECK AND LEFT FOREARM  . Thrombectomy / arteriovenous graft revision Left 08/18/2015    thigh  . Thrombectomy w/ embolectomy Left 08/18/2015    Procedure: THROMBECTOMY  AND REVISION ARTERIOVENOUS GORE-TEX GRAFT/LEFT THIGH;  Surgeon: [name], MD;  Location: [place];  Service: Vascular;  Laterality: Left;  . Thrombectomy and revision of arterioventous (av) goretex  graft Left 08/23/2015    Procedure: THROMBECTOMY AND REVISION OF ARTERIOVENTOUS (AV) GORETEX  GRAFT LEFT THIGH ;  Surgeon: [name], MD;  Location: [place];  Service: Vascular;  Laterality: Left;    Social History   Social History  . Marital Status: Married    Spouse Name: N/A  . Number of Children: N/A  . Years of Education: N/A   Occupational History  . Not on file.   Social History Main Topics  . Smoking status: Former Smoker   Quit date: 03/21/1986  . Smokeless tobacco: Never Used  . Alcohol Use: No  . Drug Use: No  . Sexual Activity: Not on file   Other Topics Concern  . Not on file   Social History Narrative    Family History: patient is unable to detail the medical history of his parents   Current Outpatient Prescriptions  Medication Sig Dispense Refill  . acetaminophen (TYLENOL) 500 MG tablet Take 1,000 mg by mouth every 6 (six) hours as needed for pain. For pain    . calcium carbonate (TUMS - DOSED IN MG ELEMENTAL CALCIUM) 500 MG chewable tablet Chew 2 tablets (400 mg of elemental calcium total) by mouth 2 (two) times daily between meals. 120 tablet 3  . Multiple Vitamin (MULTIVITAMIN WITH MINERALS) TABS tablet Take 1 tablet by mouth daily.    . pantoprazole (PROTONIX) 40 MG tablet Take 1 tablet (40 mg total) by mouth daily. 30 tablet 0  . oxyCODONE-acetaminophen (ROXICET) 5-325 MG tablet Take 1 tablet by mouth every 4 (four) hours as needed for moderate pain. (Patient not taking: Reported on 08/26/2015) 30 tablet 0   No current facility-administered medications for this visit.     Allergies  Allergen Reactions  . Lisinopril Cough     REVIEW OF SYSTEMS:  (Positives checked otherwise negative)  CARDIOVASCULAR:   [ ]   chest pain,  [ ]  chest pressure,  [ ]  palpitations,  [ ]  shortness of breath when laying flat,  [ ]  shortness of breath with exertion,   [ ]  pain in feet when walking,  [ ]  pain in feet when laying flat, [ ]  history of blood clot in veins (DVT),  [ ]  history of phlebitis,  [ ]  swelling in legs,  [ ]  varicose veins  PULMONARY:   [ ]  productive cough,  [ ]  asthma,  [ ]  wheezing  NEUROLOGIC:   [ ]  weakness in arms or legs,  [ ]  numbness in arms or legs,  [ ]  difficulty speaking or slurred speech,  [ ]  temporary loss of vision in one eye,  [ ]  dizziness  HEMATOLOGIC:   [ ]  bleeding problems,  [ ]  problems with blood clotting too easily  MUSCULOSKEL:   [ ]  joint  pain, [ ]  joint swelling  GASTROINTEST:   [ ]  vomiting blood,  [ ]  blood in stool     GENITOURINARY:   [ ]  burning with urination,  [ ]  blood in urine  PSYCHIATRIC:   [ ]  history of major depression  INTEGUMENTARY:   [ ]  rashes,  [ ]  ulcers  CONSTITUTIONAL:   [x]  fever,  [x]  chills   Physical Examination  Filed Vitals:   11/06/15 1404  BP: 123/79  Pulse: 102  Temp: 98.1 F (36.7 C)  TempSrc: Oral  Resp: 18  Height: 6' (1.829 m)  Weight: 171 lb (77.565 kg)  SpO2: 100%   Body mass index is 23.19 kg/(m^2).  General: A&O x 3, WD, WN  Pulmonary: Sym exp, good air movt, CTAB, no rales, rhonchi, & wheezing  Cardiac: RRR, Nl S1, S2, no Murmurs, rubs or gallops  Vascular:  Faintly palpable PT in L foot, visibly pulsatile mass in L groin without any skin thinning, +bruit, +thrill in L thigh AVG, no erythema, no TTP  Gastrointestinal: soft, NTND, no G/R, bo HSM, no masses, no CVAT B  Musculoskeletal: M/S 5/5 throughout , Extremities without  ischemic changes  Neurologic: Pain and light touch intact in extremities , Motor exam as listed above  Non-Invasive Vascular Imaging  left thigh AVG Duplex  (Date: 11/06/2015):   PSA measuring 4.97 cm   Medical Decision Making  [name] is a 45 y.o. male who presents with PSA at site of prior thigh AVG revision   No evidence of imminent rupture and no fever today to justify immediate intervention, though has some sx concerning for bacteremia.  Will schedule patient with next available attending this coming Monday for exploration of Left groin, possible repair of left thigh AVG, possible excision of L thigh AVG and placement of TDC if excision is necessary. Risk, benefits, and alternatives to access surgery were discussed.   The patient is aware the risks include but are not limited to: bleeding, infection, steal syndrome, nerve damage, ischemic monomelic neuropathy, failure to mature, need for additional  procedures, death and stroke.   The patient is aware the risks of tunneled dialysis catheter placement include but are not limited to: bleeding, infection, central venous injury, pneumothorax, possible venous stenosis, possible malpositioning in the venous system, and possible infections related to long-term catheter presence.  The patient agrees to proceed forward with the procedure.   Adele Barthel, MD [facility] Office: [phone] Pager: [phone]  11/06/2015, 2:45 PM

## 2015-11-10 ENCOUNTER — Encounter (HOSPITAL_COMMUNITY): Payer: Self-pay | Admitting: Vascular Surgery

## 2015-11-10 LAB — CBC
HEMATOCRIT: 27.2 % — AB (ref 39.0–52.0)
HEMOGLOBIN: 8.9 g/dL — AB (ref 13.0–17.0)
MCH: 30.7 pg (ref 26.0–34.0)
MCHC: 32.7 g/dL (ref 30.0–36.0)
MCV: 93.8 fL (ref 78.0–100.0)
Platelets: 217 10*3/uL (ref 150–400)
RBC: 2.9 MIL/uL — ABNORMAL LOW (ref 4.22–5.81)
RDW: 14.9 % (ref 11.5–15.5)
WBC: 5.9 10*3/uL (ref 4.0–10.5)

## 2015-11-10 LAB — BASIC METABOLIC PANEL
Anion gap: 19 — ABNORMAL HIGH (ref 5–15)
BUN: 80 mg/dL — AB (ref 6–20)
CALCIUM: 7.7 mg/dL — AB (ref 8.9–10.3)
CO2: 23 mmol/L (ref 22–32)
CREATININE: 22.04 mg/dL — AB (ref 0.61–1.24)
Chloride: 95 mmol/L — ABNORMAL LOW (ref 101–111)
GFR calc non Af Amer: 2 mL/min — ABNORMAL LOW (ref >60)
GFR, EST AFRICAN AMERICAN: 2 mL/min — AB (ref >60)
Glucose, Bld: 114 mg/dL — ABNORMAL HIGH (ref 65–99)
Potassium: 6 mmol/L — ABNORMAL HIGH (ref 3.5–5.1)
Sodium: 137 mmol/L (ref 135–145)

## 2015-11-10 LAB — MRSA PCR SCREENING: MRSA BY PCR: NEGATIVE

## 2015-11-10 MED ORDER — OXYCODONE-ACETAMINOPHEN 5-325 MG PO TABS: 1.0000 | ORAL_TABLET | ORAL | Status: DC | PRN

## 2015-11-10 MED ORDER — DIPHENHYDRAMINE HCL 25 MG PO CAPS
25.0000 mg | ORAL_CAPSULE | Freq: Four times a day (QID) | ORAL | Status: DC | PRN
  Administered 2015-11-10: 25 mg via ORAL
  Filled 2015-11-10: qty 1

## 2015-11-10 MED ORDER — DIPHENHYDRAMINE HCL 25 MG PO CAPS: 25.0000 mg | ORAL_CAPSULE | Freq: Four times a day (QID) | ORAL | Status: DC | PRN

## 2015-11-10 MED ORDER — OXYCODONE HCL 5 MG PO TABS
ORAL_TABLET | ORAL | Status: AC
  Administered 2015-11-10: 10 mg via ORAL
  Filled 2015-11-10: qty 2

## 2015-11-10 NOTE — Progress Notes (Signed)
  [facility] Progress Note  Assessment/Plan: 1 S/P revision L thigh AV graft: Per Dr. Donnetta Hutching. OK to use.  2 ESRD HD MWF at [facility]. Had HD 02/06 on schedule 3 Volume/Hypertension: BP controlled. HD 02/06 net UF 1600 post 78.1.  4 Anemia: Hgb 12.2 yesterday, down to 8.9 today. OR EBL 400cc. Monitor HGB. Give ESA 2/8.  5 MBD: hx ptx, tums 6 ac  Disposition: Home today. HD in center tomorrow per schedule   Velva Harman H. Brown NP-C 11/10/2015, 11:22 AM  [place]  Pt seen, examined and agree w A/P as above.  Kelly Splinter MD [facility] pager [phone]    cell [phone] 11/10/2015, 2:00 PM    Subjective: "I feel OK...that pain medicine made me itch yesterday." Up in chair, NAD   Objective Filed Vitals:   11/10/15 0424 11/10/15 0428 11/10/15 0524 11/10/15 0956  BP: 101/65 105/71 97/67 116/75  Pulse: 81 87 92 103  Temp: 98.2 F (36.8 C)  98.9 F (37.2 C) 99.1 F (37.3 C)  TempSrc:   Oral Oral  Resp: 24  18 18   Weight: 78.1 kg (172 lb 2.9 oz)     SpO2:   100% 100%   Physical Exam General: Well nourished, NAD. Heart: S1, S2, RRR Lungs: Bilateral breath sounds CTA Abdomen: soft nontender Extremities: No LE edema. Steri strips L groin CDI. Dialysis Access:  L thigh AVG + bruit  Dialysis Orders: [facility]: [facility] 3h 1min 75kg 2/ 2.5 bath P4 Hep 1500 L thigh AVG 10.6/ 32%/ 916 > darbe 25 qwk last 2/1, venofer 50/wk 9.3/ 6.6/ 12 pth > tums 6 ac tid, hx parathyroidectomy Jul '15   Additional Objective Labs: Basic Metabolic Panel:  Recent Labs Lab 11/09/15 0722 11/10/15 0055  NA 139 137  K 5.6* 6.0*  CL  --  95*  CO2  --  23  GLUCOSE 77 114*  BUN  --  80*  CREATININE  --  22.04*  CALCIUM  --  7.7*   Liver Function Tests: No results for input(s): AST, ALT, ALKPHOS, BILITOT, PROT, ALBUMIN in the last 168 hours. No results for input(s): LIPASE, AMYLASE in the last 168 hours. CBC:  Recent Labs Lab  11/09/15 0722 11/10/15 0055  WBC  --  5.9  HGB 12.2* 8.9*  HCT 36.0* 27.2*  MCV  --  93.8  PLT  --  217   Blood Culture    Component Value Date/Time   SDES NASAL SWAB 08/26/2015 2234   SPECREQUEST NONE 08/26/2015 2234   CULT NOMRSA Performed at [facility]  08/26/2015 2234   REPTSTATUS 08/28/2015 FINAL 08/26/2015 2234    Cardiac Enzymes: No results for input(s): CKTOTAL, CKMB, CKMBINDEX, TROPONINI in the last 168 hours. CBG: No results for input(s): GLUCAP in the last 168 hours. Iron Studies: No results for input(s): IRON, TIBC, TRANSFERRIN, FERRITIN in the last 72 hours. @lablastinr3 @ Studies/Results: No results found. Medications: . sodium chloride     . docusate sodium  100 mg Oral Daily  . [START ON 11/11/2015] ferric gluconate (FERRLECIT/NULECIT) IV  62.5 mg Intravenous Q Wed-HD  . pantoprazole  40 mg Oral Daily

## 2015-11-10 NOTE — Progress Notes (Signed)
Dialysis treatment completed.  2300 mL ultrafiltrated and net fluid removal 1600 mL.    Patient status unchanged. Lung sounds clear to ausculation in all fields. No edema. Cardiac: Regular R&R.  Disconnected lines and removed needles.  Pressure held for 10 minutes and band aid/gauze dressing applied.  Report given to bedside RN, [place].

## 2015-11-10 NOTE — Evaluation (Signed)
Occupational Therapy Evaluation Patient Details Name: [name] MRN: [id number] DOB: 08-10-1971 Today's Date: 11/10/2015    History of Present Illness The patient is a 45 y.o. 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 Surgery done today and postop only c/o is pain. No sob, cough or edema. Needs HD today   Clinical Impression   Patient presenting with decreased ADL and functional mobility independence secondary to above. Patient independent PTA. Patient currently functioning at an overall supervision to min guard assist level. Patient will benefit from acute OT to increase overall independence in the areas of ADLs, functional mobility, and overall safety in order to safely discharge home with assistance prn from family.   Pt limited this session by dizziness. Pt stated he recently had some medication, BP checked=93/69; RN aware.     Follow Up Recommendations  No OT follow up;Supervision - Intermittent    Equipment Recommendations  3 in 1 bedside comode    Recommendations for Other Services  None at this time   Precautions / Restrictions Precautions Precautions: Fall Restrictions Weight Bearing Restrictions: No    Mobility Bed Mobility Overal bed mobility: Needs Assistance Bed Mobility: Supine to Sit     Supine to sit: Min assist     General bed mobility comments: pulling through PTs hand due to pain righting trunk  Transfers Overall transfer level: Needs assistance Equipment used: Rolling walker (2 wheeled) Transfers: Sit to/from Stand Sit to Stand: Min guard General transfer comment: Cues for safety, hand placement, and technique. Pt preferred not to use RW and after standing with RW, pushed it out of the way.     Balance Overall balance assessment: Needs assistance Sitting-balance support: No upper extremity supported;Feet supported Sitting balance-Leahy Scale: Good     Standing  balance support: No upper extremity supported;During functional activity Standing balance-Leahy Scale: Fair    ADL Overall ADL's : Needs assistance/impaired General ADL Comments: Pt overall supervision to min assist with ADLs and functional mobility. Pt eager NOT to use RW and pushed RW out of way during OT session and ambulated without it. Pt ambulated to BR and transferred onto comfort height toilet seat with difficulty. Had pt try with [facility] and he had more success, safer and easier on him. Pt with difficulty reaching LLE for LB ADLs, pt stated that after his first surgery he "just managed". Recommending 3-n-1, notified RN. Pt also with complaints of dizziness during session, to which limited his ability to participate. Pt stated he recently had some medication. BP=93/69, notified RN of BP and patient's complaints of dizziness.  Educated patient on how he can use 3-n-1 over toilet seat and in [place]. Pt will benefit from further education on how to place 3-n-1 in tub/shower.     Pertinent Vitals/Pain Pain Assessment: 0-10 Pain Score: 3  Pain Location: left groin Pain Descriptors / Indicators: Grimacing;Guarding Pain Intervention(s): Limited activity within patient's tolerance;Monitored during session;Repositioned    Hand Dominance Right   Extremity/Trunk Assessment Upper Extremity Assessment Upper Extremity Assessment: Overall WFL for tasks assessed   Lower Extremity Assessment Lower Extremity Assessment: Defer to PT evaluation LLE Deficits / Details: decreased ROM and WBing due to pain from incision LLE: Unable to fully assess due to pain   Cervical / Trunk Assessment Cervical / Trunk Assessment: Normal   Communication Communication Communication: No difficulties   Cognition Arousal/Alertness: Awake/alert Behavior During Therapy: WFL for tasks assessed/performed Overall Cognitive Status:  Within Functional Limits for tasks assessed              Home Living Family/patient  expects to be discharged to:: Private residence Living Arrangements: Other relatives (sister) Available Help at Discharge: Family Type of Home: House Home Access: Level entry     Home Layout: One level     Bathroom Shower/Tub: Corporate investment banker: Standard     Home Equipment: None   Prior Functioning/Environment Level of Independence: Independent        Comments: Works at [employer]    OT Diagnosis: Generalized weakness;Acute pain   OT Problem List: Decreased strength;Decreased activity tolerance;Impaired balance (sitting and/or standing);Decreased safety awareness;Decreased knowledge of use of DME or AE;Decreased knowledge of precautions;Pain   OT Treatment/Interventions: Self-care/ADL training;Therapeutic exercise;Energy conservation;DME and/or AE instruction;Therapeutic activities;Patient/family education;Balance training    OT Goals(Current goals can be found in the care plan section) Acute Rehab OT Goals Patient Stated Goal: go home today OT Goal Formulation: With patient Time For Goal Achievement: 11/17/15 Potential to Achieve Goals: Good ADL Goals Pt Will Perform Grooming: with modified independence;standing Pt Will Perform Lower Body Bathing: with modified independence;sit to/from stand Pt Will Perform Lower Body Dressing: with modified independence;sit to/from stand Pt Will Transfer to Toilet: with modified independence;ambulating;bedside commode Pt Will Perform Tub/Shower Transfer: Tub transfer;ambulating;with modified independence;3 in 1  OT Frequency: Min 2X/week   Barriers to D/C: Decreased caregiver support   End of Session Equipment Utilized During Treatment: Rolling walker Nurse Communication: Other (comment) (pt's complaints of dizziness and BP reading AND recommendation for 3-n-1)  Activity Tolerance: Other (comment) (limited by dizziness) Patient left: in chair;with call bell/phone within reach;with chair alarm set   Time:  [phone] OT Time Calculation (min): 16 min Charges:  OT General Charges $OT Visit: 1 Procedure OT Evaluation $OT Eval Moderate Complexity: 1 Procedure   Chrys Racer , MS, OTR/L, CLT Pager: [phone]  11/10/2015, 9:44 AM

## 2015-11-10 NOTE — Progress Notes (Signed)
Patient arrived to unit per bed.  Reviewed treatment plan and this RN agrees.  Report received from bedside RN, [facility].  Consent obtained.  Patient A & O X 4. Lung sounds clear to ausculation in all fields. No edema. Cardiac: Regular R&R.  Prepped left thigh AVG with alcohol and cannulated with two 15 gauge needles.  Pulsation of blood noted.  Flushed access well with saline per protocol.  Connected and secured lines and initiated tx at 0054.  UF goal of 3500 mL and net fluid removal of 3000 mL.  Will continue to monitor.

## 2015-11-10 NOTE — Evaluation (Signed)
Physical Therapy Evaluation Patient Details Name: [name] MRN: [id number] DOB: [birth year] Today's Date: 11/10/2015   History of Present Illness  The patient is a 45 y.o. 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 Surgery done today and postop only c/o is pain. No sob, cough or edema. Needs HD today  Clinical Impression  Pt POD#1 presenting with the PT deficits below. Pt limited this session due to pain in L LE. Tolerated ambulating short distance with RW without LOB and requiring Min G for safety in this environment. Pt demonstrated reliance on bed rails and armrests for transfers and mobility due to increase in pain with movement and increased range of motion of LLE. However, with limitations pt completed all tasks with Min G with increased time. Suspect pt with move much better as pain decreases. Will continue to follow pt acutely for increased mobility. Recommending initial supervision/assistance for all OOB upon returning home with sister.    Follow Up Recommendations Supervision for mobility/OOB    Equipment Recommendations  Rolling walker with 5" wheels    Recommendations for Other Services       Precautions / Restrictions Precautions Precautions: Fall Restrictions Weight Bearing Restrictions: No      Mobility  Bed Mobility Overal bed mobility: Needs Assistance Bed Mobility: Supine to Sit     Supine to sit: Min assist     General bed mobility comments: pulling through PTs hand due to pain righting trunk  Transfers Overall transfer level: Needs assistance Equipment used: Rolling walker (2 wheeled) Transfers: Sit to/from Stand Sit to Stand: Min guard         General transfer comment: Bed height elevated, verbal cues for correct handplacement however pulls up on RW with one hand, proper safety awareness standing from chair  Ambulation/Gait Ambulation/Gait assistance: Min  guard Ambulation Distance (Feet): 60 Feet Assistive device: Rolling walker (2 wheeled) Gait Pattern/deviations: Step-through pattern;Decreased step length - left;Decreased stance time - left;Decreased weight shift to left;Shuffle;Antalgic Gait velocity: slow Gait velocity interpretation: Below normal speed for age/gender General Gait Details: safe with RW, however requiring safety cues to not let go of walker, pt ambulated around bed w/o AD with increased limp however no LOB  Stairs            Wheelchair Mobility    Modified Rankin (Stroke Patients Only)       Balance Overall balance assessment: Modified Independent;Needs assistance Sitting-balance support: Feet supported;No upper extremity supported Sitting balance-Leahy Scale: Good     Standing balance support: Bilateral upper extremity supported Standing balance-Leahy Scale: Fair                               Pertinent Vitals/Pain Pain Assessment: 0-10 Pain Score: 5  Pain Location: L groin Pain Descriptors / Indicators: Guarding;Grimacing;Discomfort Pain Intervention(s): Limited activity within patient's tolerance;Monitored during session    Home Living Family/patient expects to be discharged to:: Private residence Living Arrangements: Other relatives Available Help at Discharge: Family Type of Home: House Home Access: Level entry     Home Layout: One level Home Equipment: None      Prior Function Level of Independence: Independent         Comments: Works at [employer] Hand: Right    Extremity/Trunk Assessment   Upper Extremity Assessment: Overall WFL for tasks  assessed           Lower Extremity Assessment: LLE deficits/detail   LLE Deficits / Details: decreased ROM and WBing due to pain from incision  Cervical / Trunk Assessment: Normal  Communication   Communication: No difficulties  Cognition Arousal/Alertness: Awake/alert Behavior  During Therapy: WFL for tasks assessed/performed Overall Cognitive Status: Within Functional Limits for tasks assessed                      General Comments      Exercises General Exercises - Lower Extremity Hip ABduction/ADduction: AROM;Left;5 reps;Standing Hip Flexion/Marching: AROM;Standing;Left;10 reps      Assessment/Plan    PT Assessment Patient needs continued PT services  PT Diagnosis Difficulty walking;Abnormality of gait;Acute pain   PT Problem List Decreased range of motion;Decreased activity tolerance;Decreased mobility  PT Treatment Interventions DME instruction;Gait training;Functional mobility training;Therapeutic activities;Therapeutic exercise   PT Goals (Current goals can be found in the Care Plan section) Acute Rehab PT Goals Patient Stated Goal: not get out of bed PT Goal Formulation: With patient Time For Goal Achievement: 11/24/15 Potential to Achieve Goals: Good    Frequency Min 3X/week   Barriers to discharge        Co-evaluation               End of Session Equipment Utilized During Treatment: Gait belt Activity Tolerance: Patient limited by pain Patient left: in chair;with call bell/phone within reach;with chair alarm set Nurse Communication: Mobility status         Time: [id number] PT Time Calculation (min) (ACUTE ONLY): 26 min   Charges:         PT G Codes:        Ara Kussmaul 12/03/15, 8:58 AM  Ara Kussmaul, Student Physical Therapist Acute Rehab [phone]

## 2015-11-10 NOTE — Progress Notes (Signed)
Utilization review completed. Kenzie Flakes, RN, BSN. 

## 2015-11-10 NOTE — Progress Notes (Addendum)
      Mr. Ejay Farland had surgery for repair of femoral thigh AV graft.  He will need to be out of work for 2-3 weeks due to heavy lifting requirements.  He will follow up with DR. Kaliana Albino in 2 weeks for back to work clearance.  COLLINS, EMMA MAUREEN PA-C  I have examined the patient, reviewed and agree with above.  Curt Jews, MD 11/10/2015 7:49 AM

## 2015-11-10 NOTE — Progress Notes (Addendum)
[facility] [place]  Subjective  - Doing OK.  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     Objective 97/67 92 98.9 F (37.2 C) (Oral) 18 100%  Intake/Output Summary (Last 24 hours) at 11/10/15 0719 Last data filed at 11/10/15 [id number]  Gross per 24 hour  Intake    900 ml  Output   2000 ml  Net  -1100 ml    Palpable DP pulses left LE Thigh soft, dressing in place will change tomorrow  Assessment/Planning: POD #1 Repair of large false femoral aneurysm and revision of left AV Gore-Tex graft  He did receive HD yesterday Discharge plan per Dr. Tonie Griffith, [place] 11/10/2015 7:19 AM --  Laboratory Lab Results:  Recent Labs  11/09/15 0722 11/10/15 0055  WBC  --  5.9  HGB 12.2* 8.9*  HCT 36.0* 27.2*  PLT  --  217   BMET  Recent Labs  11/09/15 0722 11/10/15 0055  NA 139 137  K 5.6* 6.0*  CL  --  95*  CO2  --  23  GLUCOSE 77 114*  BUN  --  80*  CREATININE  --  22.04*  CALCIUM  --  7.7*    COAG Lab Results  Component Value Date   INR 1.05 07/27/2015   INR 1.09 02/12/2010   No results found for: PTT    I have examined the patient, reviewed and agree with above. Left groin stable. Dressing removed. Good thrill in the graft. Okay for discharge today. Office visit in 2 weeks  Curt Jews, MD 11/10/2015 7:50 AM

## 2015-11-18 NOTE — Discharge Summary (Signed)
[facility] Discharge Summary   Patient ID:  [name] MRN: [id number] DOB/AGE: 44-02-1971 43 y.o.  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 Hypertension   . Anemia   . Thyroid disease   . Cough     DRY    . Headache(784.0)   . Muscle spasms of neck     BACK, NECK  . Depression   . ESRD on hemodialysis ([facility])     HD [facility] MWF    Procedure(s): REPAIR OF LEFT FEMORAL PSEUDOANEURYSM; REVISION  OF LEFT THIGH ARTERIOVENOUS GRAFT USING 6MM X 10 CM GORETEX GRAFT   Discharged Condition: good  HPI: [name] is a 45 y.o. ([birth year]) male who presents for evaluation of large pulse in left groin. This patient notes onset of big pulsatile mass in left groin ~2 weeks ago. He is s/p T&R L thigh AVG on 08/23/15 with Dr. Scot Dock. The patient denies any complications initially from that surgery. He had been dialyzing without difficulty. He notes some "fever" and chills over the last few days. Reportedly he has been given antibiotic on HD.   Hospital Course:  [name] is a 45 y.o. male is S/P  Procedure(s): REPAIR OF LEFT FEMORAL PSEUDOANEURYSM; REVISION  OF LEFT THIGH ARTERIOVENOUS GRAFT USING 6MM X 10 CM GORETEX GRAFT   POD#1   Left groin stable. Dressing removed. Good thrill in the graft. Okay for discharge today. Office visit in 2 weeks  Consults:  Treatment Team:  Roney Jaffe, MD  Significant Diagnostic Studies: CBC Lab Results  Component Value Date   WBC 5.9 11/10/2015   HGB 8.9* 11/10/2015   HCT 27.2* 11/10/2015   MCV 93.8 11/10/2015   PLT 217 11/10/2015    BMET     Component Value Date/Time   NA 137 11/10/2015 0055   K 6.0* 11/10/2015 0055   CL 95* 11/10/2015 0055   CO2 23 11/10/2015 0055   GLUCOSE 114* 11/10/2015 0055   BUN 80* 11/10/2015 0055   CREATININE 22.04* 11/10/2015 0055   CALCIUM 7.7* 11/10/2015 0055   CALCIUM 8.7 02/16/2010 0827   GFRNONAA 2* 11/10/2015 0055   GFRAA 2* 11/10/2015 0055   COAG Lab Results  Component Value Date   INR 1.05 07/27/2015   INR 1.09 02/12/2010     Disposition:  Discharge to :Home Discharge Instructions    Call MD for:  redness, tenderness, or signs of infection (pain, swelling, bleeding, redness, odor or green/yellow discharge around incision site)    Complete by:  As directed      Call MD for:  severe or increased pain, loss or decreased feeling  in affected limb(s)    Complete by:  As directed      Call MD for:  temperature >100.5    Complete by:  As directed      Discharge instructions    Complete by:  As directed   You may wash your leg in 48 hours from surgery     Driving Restrictions    Complete by:  As directed   No driving while taking pain medication     Lifting restrictions  Complete by:  As directed   No heavy lifting for 2 weeks     Resume previous diet    Complete by:  As directed             Medication List    TAKE these medications        acetaminophen 500 MG tablet  Commonly known as:  TYLENOL  Take 1,000 mg by mouth every 6 (six) hours as needed for pain. For pain     calcium carbonate 500 MG chewable tablet  Commonly known as:  TUMS - dosed in mg elemental calcium  Chew 2 tablets (400 mg of elemental calcium total) by mouth 2 (two) times daily between meals.     diphenhydrAMINE 25 mg capsule  Commonly known as:  BENADRYL  Take 1 capsule (25 mg total) by mouth every 6 (six) hours as needed for itching.     oxyCODONE-acetaminophen 5-325 MG tablet  Commonly known as:  ROXICET  Take 1 tablet by mouth every 4 (four) hours as needed for moderate pain.      pantoprazole 40 MG tablet  Commonly known as:  PROTONIX  Take 1 tablet (40 mg total) by mouth daily.       Verbal and written Discharge instructions given to the patient. Wound care per Discharge AVS   Signed: Laurence Slate [facility] 11/18/2015, 10:15 AM

## 2015-11-27 ENCOUNTER — Encounter: Payer: Self-pay | Admitting: Vascular Surgery

## 2015-12-01 ENCOUNTER — Encounter: Payer: Medicare Other | Admitting: Vascular Surgery

## 2015-12-31 ENCOUNTER — Ambulatory Visit: Payer: Medicare Other | Admitting: Vascular Surgery

## 2015-12-31 ENCOUNTER — Encounter: Payer: Self-pay | Admitting: Vascular Surgery

## 2015-12-31 ENCOUNTER — Ambulatory Visit (INDEPENDENT_AMBULATORY_CARE_PROVIDER_SITE_OTHER): Payer: Medicare Other | Admitting: Vascular Surgery

## 2015-12-31 VITALS — BP 107/74 | HR 66 | Temp 97.6°F | Resp 16 | Ht 72.0 in | Wt 175.0 lb

## 2015-12-31 DIAGNOSIS — T82898D Other specified complication of vascular prosthetic devices, implants and grafts, subsequent encounter: Secondary | ICD-10-CM

## 2015-12-31 DIAGNOSIS — T82511D Breakdown (mechanical) of surgically created arteriovenous shunt, subsequent encounter: Secondary | ICD-10-CM

## 2015-12-31 NOTE — Progress Notes (Signed)
    Postoperative Access Visit   History of Present Illness  [name] is a 45 y.o. year old male who presents for postoperative follow-up for: L femoral PSA repair by Dr. Donnetta Hutching (Date: 11/09/15).  Reportedly his PSA has enlarged and now extend over into pubic area.  For VQI Use Only  PRE-ADM LIVING: Home  AMB STATUS: Ambulatory  Physical Examination Filed Vitals:   12/31/15 1548  BP: 107/74  Pulse: 66  Temp: 97.6 F (36.4 C)  Resp: 16    L groin: Incision is healed, +thrill and bruit, ballotable fluid collection in L groin, laterally with pulse, medially without pulse, no erythema  Medical Decision Making  [name] is a 45 y.o. year old male who presents s/p L femoral PSA repair .  Unclear to me if this is a post-op seroma vs recurrent PSA  Will obtain non-invasive studies and have pt follow up with Dr. Donnetta Hutching this coming week.  Thank you for allowing [place] to participate in this patient's care.  Adele Barthel, MD [facility] [place] Office: [phone] Pager: [phone]  12/31/2015, 4:19 PM

## 2016-01-01 ENCOUNTER — Encounter (HOSPITAL_COMMUNITY): Payer: Medicare Other

## 2016-01-01 ENCOUNTER — Telehealth: Payer: Self-pay | Admitting: Vascular Surgery

## 2016-01-01 NOTE — Telephone Encounter (Signed)
Pt refused u/s on 3/31 per dc sheet 3/30. Tried to reach pt x 4, leaving 1 message to call the office to schedule groin pseudoaneurysm duplex and 1 wk fu TFE - unable to reach pt.

## 2016-01-05 ENCOUNTER — Ambulatory Visit: Payer: Medicare Other | Admitting: Family

## 2016-01-07 ENCOUNTER — Ambulatory Visit (INDEPENDENT_AMBULATORY_CARE_PROVIDER_SITE_OTHER): Payer: Self-pay | Admitting: Family

## 2016-01-07 ENCOUNTER — Encounter: Payer: Self-pay | Admitting: Family

## 2016-01-07 VITALS — BP 122/85 | HR 76 | Temp 97.4°F | Resp 14 | Ht 72.0 in | Wt 178.4 lb

## 2016-01-07 DIAGNOSIS — IMO0002 Reserved for concepts with insufficient information to code with codable children: Secondary | ICD-10-CM

## 2016-01-07 DIAGNOSIS — T82898D Other specified complication of vascular prosthetic devices, implants and grafts, subsequent encounter: Secondary | ICD-10-CM

## 2016-01-07 DIAGNOSIS — T82511D Breakdown (mechanical) of surgically created arteriovenous shunt, subsequent encounter: Secondary | ICD-10-CM

## 2016-01-07 DIAGNOSIS — T148 Other injury of unspecified body region: Secondary | ICD-10-CM

## 2016-01-07 NOTE — Progress Notes (Signed)
    Postoperative Access Visit   History of Present Illness  [name] is a 45 y.o. year old male patient of Dr. Bridgett [name] who presents for postoperative follow-up for: L femoral PSA repair by Dr. Donnetta [name] (Date: 11/09/15). Reportedly his PSA has enlarged and now extend over into pubic area. He is s/p thrombectomy and revision of left thigh AV graft (replaced the arterial/lateral half of the graft.) on 08/23/15 with Dr. Scot [name].  Dr. Bridgett [name] last saw pt on 12/31/15. At that time it was unclear to Dr. Bridgett [name] if this is a post-op seroma vs recurrent PSA Will obtain non-invasive studies and have pt follow up with Dr. Donnetta [name] this coming week. Pt returns today for the above studies and follow upevaluation but it appears that the above studies were not performed for unknown reason.  Pt states he dialyzes M-W-F and there are no problems with HD.  Pt states that the swelling in the left groin is getting larger since he saw Dr. Bridgett [name] last week. Pt denies fever or chills, denies left groin pain other than a pinch feeling. His pedal pulses are palpable.   The patient is able to complete their activities of daily living.     For VQI Use Only  PRE-ADM LIVING: Home  AMB STATUS: Ambulatory  Physical Examination Filed Vitals:   01/07/16 0951  BP: 122/85  Pulse: 76  Temp: 97.4 F (36.3 C)  TempSrc: Oral  Resp: 14  Height: 6' (1.829 m)  Weight: 178 lb 6.4 oz (80.922 kg)  SpO2: 98%   Body mass index is 24.19 kg/(m^2).  Left groin incision is well healed, large swelling in left groin that extends into pubis is tender to palpation, palpable thrill at his left anterior thigh HD access..  Medical Decision Making  [name] is a 45 y.o. year old male who presents s/p L femoral PSA repair by Dr. Donnetta [name] (Date: 11/09/15). Reportedly his PSA has enlarged and now extend over into pubic area. He is s/p thrombectomy and revision of left thigh AV graft (replaced the arterial/lateral half of  the graft.) on 08/23/15 with Dr. Scot [name].  Dr. Bridgett [name] last saw pt on 12/31/15. At that time it was unclear to Dr. Bridgett [name] if this is a post-op seroma vs recurrent PSA Will obtain non-invasive studies and have pt follow up with Dr. Donnetta [name] this coming week. Pt returns today for the above studies and follow upevaluation but it appears that the above studies were not performed for unknown reason. Dr. Donnetta [name] spoke with and examined pt. De. Early thinks this swelling has the characteristics of a seroma, not a recurrent pseudoaneurysm. Pt was advised to notify us if his left groin swelling increases or becomes more painful. Return in 3 weeks for left groin evaluation by Dr. Donnetta [name].   Thank you for allowing [place] to participate in this patient's care.  [name], Anthony Leyden, RN, MSN, FNP-C [facility] [place] Office: [phone]  01/07/2016, 9:56 AM  Clinic MD: [name]

## 2016-01-20 ENCOUNTER — Encounter: Payer: Self-pay | Admitting: Vascular Surgery

## 2016-01-26 ENCOUNTER — Ambulatory Visit: Payer: Medicare Other | Admitting: Vascular Surgery

## 2016-02-08 ENCOUNTER — Telehealth: Payer: Self-pay

## 2016-02-08 NOTE — Telephone Encounter (Signed)
rec'd call from [facility].  Reported the pt. C/o that "mass" left groin has gotten bigger.  Requested appt. For evaluation.  Noted last office note, pt. was to return in follow-up with Dr. Donnetta Hutching, but didn't show up.  Appt. Given for 02/09/16 @ 10:30 AM with Dr. Donnetta Hutching.  Alanna, at the [facility] stated she will give the appt. To the pt.

## 2016-02-09 ENCOUNTER — Ambulatory Visit: Payer: Medicare Other | Admitting: Vascular Surgery

## 2016-03-11 ENCOUNTER — Other Ambulatory Visit: Payer: Self-pay | Admitting: General Surgery

## 2016-03-11 ENCOUNTER — Other Ambulatory Visit (HOSPITAL_COMMUNITY): Payer: Self-pay | Admitting: Nephrology

## 2016-03-11 DIAGNOSIS — T82868A Thrombosis of vascular prosthetic devices, implants and grafts, initial encounter: Secondary | ICD-10-CM

## 2016-03-12 ENCOUNTER — Other Ambulatory Visit (HOSPITAL_COMMUNITY): Payer: Self-pay | Admitting: Nephrology

## 2016-03-12 ENCOUNTER — Ambulatory Visit (HOSPITAL_COMMUNITY)
Admission: RE | Admit: 2016-03-12 | Discharge: 2016-03-12 | Disposition: A | Discharge status: Home / Self Care | Payer: Medicare Other | Source: Ambulatory Visit | Attending: Nephrology | Admitting: Nephrology

## 2016-03-12 DIAGNOSIS — E079 Disorder of thyroid, unspecified: Secondary | ICD-10-CM | POA: Insufficient documentation

## 2016-03-12 DIAGNOSIS — Z87891 Personal history of nicotine dependence: Secondary | ICD-10-CM | POA: Insufficient documentation

## 2016-03-12 DIAGNOSIS — T82868A Thrombosis of vascular prosthetic devices, implants and grafts, initial encounter: Secondary | ICD-10-CM

## 2016-03-12 DIAGNOSIS — F329 Major depressive disorder, single episode, unspecified: Secondary | ICD-10-CM | POA: Insufficient documentation

## 2016-03-12 DIAGNOSIS — I120 Hypertensive chronic kidney disease with stage 5 chronic kidney disease or end stage renal disease: Secondary | ICD-10-CM | POA: Insufficient documentation

## 2016-03-12 DIAGNOSIS — D649 Anemia, unspecified: Secondary | ICD-10-CM | POA: Insufficient documentation

## 2016-03-12 DIAGNOSIS — N186 End stage renal disease: Secondary | ICD-10-CM | POA: Insufficient documentation

## 2016-03-12 DIAGNOSIS — T82858A Stenosis of vascular prosthetic devices, implants and grafts, initial encounter: Secondary | ICD-10-CM | POA: Insufficient documentation

## 2016-03-12 DIAGNOSIS — Y832 Surgical operation with anastomosis, bypass or graft as the cause of abnormal reaction of the patient, or of later complication, without mention of misadventure at the time of the procedure: Secondary | ICD-10-CM | POA: Insufficient documentation

## 2016-03-12 DIAGNOSIS — Z992 Dependence on renal dialysis: Secondary | ICD-10-CM | POA: Insufficient documentation

## 2016-03-12 LAB — POCT I-STAT, CHEM 8
BUN: 60 mg/dL — ABNORMAL HIGH (ref 6–20)
CALCIUM ION: 0.62 mmol/L — AB (ref 1.12–1.23)
CHLORIDE: 108 mmol/L (ref 101–111)
Creatinine, Ser: 14.6 mg/dL — ABNORMAL HIGH (ref 0.61–1.24)
GLUCOSE: 64 mg/dL — AB (ref 65–99)
HCT: 24 % — ABNORMAL LOW (ref 39.0–52.0)
HEMOGLOBIN: 8.2 g/dL — AB (ref 13.0–17.0)
Potassium: 3.3 mmol/L — ABNORMAL LOW (ref 3.5–5.1)
SODIUM: 150 mmol/L — AB (ref 135–145)
TCO2: 18 mmol/L (ref 0–100)

## 2016-03-12 MED ORDER — FENTANYL CITRATE (PF) 100 MCG/2ML IJ SOLN
INTRAMUSCULAR | Status: AC
  Filled 2016-03-12: qty 4

## 2016-03-12 MED ORDER — MIDAZOLAM HCL 2 MG/2ML IJ SOLN
INTRAMUSCULAR | Status: AC | PRN
  Administered 2016-03-12 (×2): 1 mg via INTRAVENOUS

## 2016-03-12 MED ORDER — LIDOCAINE HCL 1 % IJ SOLN
INTRAMUSCULAR | Status: AC
  Administered 2016-03-12: 10 mL
  Filled 2016-03-12: qty 20

## 2016-03-12 MED ORDER — HEPARIN SODIUM (PORCINE) 1000 UNIT/ML IJ SOLN
INTRAMUSCULAR | Status: AC
  Administered 2016-03-12: 3000 [IU]
  Filled 2016-03-12: qty 1

## 2016-03-12 MED ORDER — MIDAZOLAM HCL 2 MG/2ML IJ SOLN
INTRAMUSCULAR | Status: AC
  Filled 2016-03-12: qty 4

## 2016-03-12 MED ORDER — IOPAMIDOL (ISOVUE-300) INJECTION 61%
INTRAVENOUS | Status: AC
  Administered 2016-03-12: 50 mL
  Filled 2016-03-12: qty 100

## 2016-03-12 MED ORDER — ALTEPLASE 2 MG IJ SOLR
INTRAMUSCULAR | Status: AC
  Filled 2016-03-12: qty 2

## 2016-03-12 MED ORDER — FENTANYL CITRATE (PF) 100 MCG/2ML IJ SOLN
INTRAMUSCULAR | Status: AC | PRN
  Administered 2016-03-12 (×2): 50 ug via INTRAVENOUS

## 2016-03-12 MED ORDER — ALTEPLASE 2 MG IJ SOLR
INTRAMUSCULAR | Status: AC
  Administered 2016-03-12: 4 mg
  Filled 2016-03-12: qty 4

## 2016-03-12 MED ORDER — ALTEPLASE 2 MG IJ SOLR
4.0000 mg | Freq: Once | INTRAMUSCULAR | Status: AC
  Administered 2016-03-12: 4 mg

## 2016-03-12 NOTE — Sedation Documentation (Signed)
Patient denies pain and is resting comfortably.  

## 2016-03-12 NOTE — Procedures (Signed)
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   Complications: None  Estimated Blood Loss: None  Recommendations:  - DC home - Resume dialysis  Signed,  Criselda Peaches, MD

## 2016-03-12 NOTE — H&P (Signed)
Chief Complaint: Patient was seen in consultation today for malfunctioning hemodialysis access (thrombosed left upper thigh arteriovenous graft) at the request of [facility]  Referring Physician(s): Dunham,Cynthia  Patient Status: Out-pt  History of Present Illness: [name] is a 45 y.o. male with a long history of end-stage renal disease on hemodialysis. He has been dialyzing through a left upper thigh AVG for more than 4 years.  The graft has undergone multiple prior interventions including thrombolysis/declot the last of which was performed in October of 2016.  He then went for surgical revision in mid November 2016 where the venous anastomosis was relocated from the great saphenous vein to the common femoral vein (7 mm graft).  Early recurrent graft thrombosis led to an additional revision surgery 5 days later with revision of the arterial limb of the graft.    Pt developed a pseudoaneurysm stemming from breakdown of the arterial anastomosis and required surgical revision again in February 2017.  The arterial anastomosis was moved higher on the CFA and replaced with a new 6 mm gore-tex jump graft.    Pt has since done well before having an episode of subjective fever this past Thursday and realizing his graft had again thrombosed.  His last successful dialysis was Wednesday.  He denies current pain, sob, fever, chills or other systemic sx.    Past Medical History  Diagnosis Date  . Hypertension   . Anemia   . Thyroid disease   . Cough     DRY    . Headache(784.0)   . Muscle spasms of neck     BACK, NECK  . Depression   . ESRD on hemodialysis ([place])     HD [facility] MWF    Past Surgical History  Procedure Laterality Date  . Arteriovenous graft placement Left     "forearm, it's not working; thigh"   . Parathyroidectomy N/A 04/24/2014    Procedure: TOTAL 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;  Surgeon: [name], MD;  Location: [place];   Service: General;  Laterality: N/A;  NECK AND LEFT FOREARM  . Thrombectomy / arteriovenous graft revision Left 08/18/2015    thigh  . Thrombectomy w/ embolectomy Left 08/18/2015    Procedure: THROMBECTOMY  AND REVISION ARTERIOVENOUS GORE-TEX GRAFT/LEFT THIGH;  Surgeon: [name], MD;  Location: [place];  Service: Vascular;  Laterality: Left;  . Thrombectomy and revision of arterioventous (av) goretex  graft Left 08/23/2015    Procedure: THROMBECTOMY AND REVISION OF ARTERIOVENTOUS (AV) GORETEX  GRAFT LEFT THIGH ;  Surgeon: [name], MD;  Location: [place];  Service: Vascular;  Laterality: Left;  . Pseudoaneurysm repair Left 11/09/2015  . Arteriovenous graft placement Left 11/09/2015  . False aneurysm repair Left 11/09/2015    Procedure: REPAIR OF LEFT FEMORAL PSEUDOANEURYSM; REVISION  OF LEFT THIGH ARTERIOVENOUS GRAFT USING 6MM X 10 CM GORETEX GRAFT ;  Surgeon: [name], MD;  Location: [place];  Service: Vascular;  Laterality: Left;    Allergies: Lisinopril  Medications: Prior to Admission medications   Medication Sig Start Date End Date Taking? Authorizing Provider  acetaminophen (TYLENOL) 500 MG tablet Take 1,000 mg by mouth every 6 (six) hours as needed for pain. For pain    Historical Provider, MD  calcium carbonate (TUMS - DOSED IN MG ELEMENTAL CALCIUM) 500 MG chewable tablet Chew 2 tablets (400 mg of elemental calcium total) by mouth 2 (two) times daily between meals. 04/26/14   Judeth Horn, MD  diphenhydrAMINE (BENADRYL) 25 mg capsule Take 1 capsule (25 mg total) by mouth every 6 (six) hours as needed for itching. Patient not taking: Reported on 01/07/2016 11/10/15   Ulyses Amor, PA-C  oxyCODONE-acetaminophen (ROXICET) 5-325 MG tablet Take 1 tablet by mouth every 4 (four) hours as needed for moderate pain. Patient not taking: Reported on 12/31/2015 11/10/15   Ulyses Amor, PA-C  pantoprazole (PROTONIX) 40 MG tablet Take 1 tablet (40 mg total) by mouth daily. Patient not  taking: Reported on 12/31/2015 08/27/15   Shela Leff, MD     No family history on file.  Social History   Social History  . Marital Status: Married    Spouse Name: N/A  . Number of Children: N/A  . Years of Education: N/A   Social History Main Topics  . Smoking status: Former Smoker    Quit date: 03/21/1986  . Smokeless tobacco: Never Used  . Alcohol Use: No  . Drug Use: No  . Sexual Activity: Not on file   Other Topics Concern  . Not on file   Social History Narrative    Review of Systems: A 12 point ROS discussed and pertinent positives are indicated in the HPI above.  All other systems are negative.  Review of Systems  Vital Signs: BP 146/107 mmHg  Pulse 63  Resp 17  Physical Exam  Constitutional: He is oriented to person, place, and time. He appears well-developed and well-nourished. No distress.  HENT:  Head: Normocephalic and atraumatic.  Eyes: No scleral icterus.  Cardiovascular: Normal rate and regular rhythm.   Pulmonary/Chest: Effort normal.  Abdominal: Soft. He exhibits no distension. There is no tenderness.  Musculoskeletal:       Legs: Non-pulsatile loop graft in left upper thigh.  Large palpable ovoid nodule likely representing residual hematoma from the prior excluded pseudoaneurysm.  Neurological: He is alert and oriented to person, place, and time.  Skin: Skin is warm and dry.  Psychiatric: He has a normal mood and affect. His behavior is normal.  Vitals reviewed.   Mallampati Score:  MD Evaluation Airway: WNL Heart: WNL Abdomen: WNL Chest/ Lungs: WNL Other Pertinent Findings: Left upper thigh AVG is thrombosed.  Large lump from prior pseudoaneurysm.  ASA  Classification: 2 Mallampati/Airway Score: Two  Imaging: No results found.  Labs:  CBC:  Recent Labs  08/18/15 1731 08/19/15 0001  08/26/15 1600 08/26/15 1615 11/09/15 0722 11/10/15 0055  WBC 8.6 6.9  --  8.9  --   --  5.9  HGB 11.1* 11.1*  < > 11.7* 11.9* 12.2*  8.9*  HCT 34.5* 33.8*  < > 33.4* 35.0* 36.0* 27.2*  PLT 178 182  --  217  --   --  217  < > = values in this interval not displayed.  COAGS:  Recent Labs  07/27/15 1145  INR 1.05  APTT 32    BMP:  Recent Labs  07/27/15 1145  08/18/15 1731 08/19/15 0001  08/26/15 1600 08/26/15 1615 11/09/15 0722 11/10/15 0055  NA 138  < >  --  137  < > 137 135 139 137  K 4.7  < >  --  5.2*  < > 4.8 4.6 5.6* 6.0*  CL 94*  --   --  95*  --  95* 97*  --  95*  CO2 27  --   --  24  --  28  --   --  23  GLUCOSE 94  < >  --  162*  < > 110* 108* 77 114*  BUN 91*  --   --  67*  --  40* 51*  --  80*  CALCIUM 9.7  --   --  8.4*  --  8.8*  --   --  7.7*  CREATININE 18.64*  --  15.53* 16.40*  --  9.78* 10.20*  --  22.04*  GFRNONAA 3*  --  3* 3*  --  6*  --   --  2*  GFRAA 3*  --  4* 4*  --  7*  --   --  2*  < > = values in this interval not displayed.  LIVER FUNCTION TESTS:  Recent Labs  08/26/15 1600  BILITOT 0.7  AST 25  ALT 5*  ALKPHOS 62  PROT 7.2  ALBUMIN 3.2*    TUMOR MARKERS: No results for input(s): AFPTM, CEA, CA199, CHROMGRNA in the last 8760 hours.  Assessment and Plan:  Thrombosed left thigh AVG.  Most recent surgical thrombectomy and revision 4 months previously.    1.) Will attempt declot and repair. 2.) If unable to salvage the graft he will need a tunneled HD catheter placed.   Thank you for this interesting consult.  I greatly enjoyed meeting Rudransh Abdou Prescott and look forward to participating in their care.  A copy of this report was sent to the requesting provider on this date.  Electronically Signed: Jacqulynn Cadet 03/12/2016, 10:13 AM   I spent a total of  25 Minutes in face to face in clinical consultation, greater than 50% of which was counseling/coordinating care for thrombosed AV graft.

## 2016-03-12 NOTE — Sedation Documentation (Signed)
Sedation to be given by MD via graft access.

## 2016-04-21 ENCOUNTER — Encounter: Payer: Self-pay | Admitting: Vascular Surgery

## 2016-04-27 ENCOUNTER — Encounter: Payer: Self-pay | Admitting: Vascular Surgery

## 2016-04-27 ENCOUNTER — Other Ambulatory Visit: Payer: Self-pay

## 2016-04-27 ENCOUNTER — Ambulatory Visit (INDEPENDENT_AMBULATORY_CARE_PROVIDER_SITE_OTHER): Payer: Self-pay | Admitting: Vascular Surgery

## 2016-04-27 VITALS — BP 129/86 | HR 96 | Temp 97.0°F | Ht 72.0 in | Wt 178.0 lb

## 2016-04-27 DIAGNOSIS — N186 End stage renal disease: Secondary | ICD-10-CM

## 2016-04-27 DIAGNOSIS — Z992 Dependence on renal dialysis: Secondary | ICD-10-CM

## 2016-04-27 DIAGNOSIS — M7989 Other specified soft tissue disorders: Secondary | ICD-10-CM

## 2016-04-27 NOTE — Progress Notes (Addendum)
[facility]  Patient name: [name] MRN: [id number] DOB: 1971-03-15 Sex: male  REASON FOR VISIT: left leg swelling  HPI: Charles Brinkerhoff Olmo is a 45 y.o. male who presents with a 4 week history of left leg swelling. He denies any pain into his left leg. He has a left AV thigh graft with repair of left femoral pseudoaneurysm on 11/09/2015. He also previously underwent thrombectomy and revision on 08/23/2015 and 08/18/2015. The patient has had a seroma since 12/31/2015. This has steadily improved. The patient complains of entire left leg swelling especially on the days that he works. He is standing most of the day. He dialyzes on Mondays, Wednesdays and Fridays. He denies any issues with dialysis.  His past medical history of hypertension which is stable.  Past Medical History:  Diagnosis Date  . Anemia   . Cough    DRY    . Depression   . ESRD on hemodialysis ([place])    HD [facility] MWF  . Headache(784.0)   . Hypertension   . Muscle spasms of neck    BACK, NECK  . Thyroid disease     History reviewed. No pertinent family history.  SOCIAL HISTORY: Social History  Substance Use Topics  . Smoking status: Former Smoker    Quit date: 03/21/1986  . Smokeless tobacco: Never Used  . Alcohol use No    Allergies  Allergen Reactions  . Lisinopril Cough    Current Outpatient Prescriptions  Medication Sig Dispense Refill  . acetaminophen (TYLENOL) 500 MG tablet Take 1,000 mg by mouth every 6 (six) hours as needed for pain. For pain    . calcium carbonate (TUMS - DOSED IN MG ELEMENTAL CALCIUM) 500 MG chewable tablet Chew 2 tablets (400 mg of elemental calcium total) by mouth 2 (two) times daily between meals. 120 tablet 3  . diphenhydrAMINE (BENADRYL) 25 mg capsule Take 1 capsule (25 mg total) by mouth every 6 (six) hours as needed for itching. 30 capsule 0  . pantoprazole (PROTONIX) 40 MG tablet Take 1 tablet (40 mg total) by mouth  daily. 30 tablet 0  . oxyCODONE-acetaminophen (ROXICET) 5-325 MG tablet Take 1 tablet by mouth every 4 (four) hours as needed for moderate pain. (Patient not taking: Reported on 12/31/2015) 30 tablet 0   No current facility-administered medications for this visit.     REVIEW OF SYSTEMS:  [X]  denotes positive finding, [ ]  denotes negative finding Cardiac  Comments:  Chest pain or chest pressure:    Shortness of breath upon exertion:    Short of breath when lying flat:    Irregular heart rhythm:        Vascular    Pain in calf, thigh, or hip brought on by ambulation:    Pain in feet at night that wakes you up from your sleep:     Blood clot in your veins:    Leg swelling:  x       Pulmonary    Oxygen at home:    Productive cough:     Wheezing:         Neurologic    Sudden weakness in arms or legs:     Sudden numbness in arms or legs:     Sudden onset of difficulty speaking or slurred speech:    Temporary loss of vision in one eye:     Problems with dizziness:         Gastrointestinal  Blood in stool:     Vomited blood:         Genitourinary    Burning when urinating:     Blood in urine:        Psychiatric    Major depression:         Hematologic    Bleeding problems:    Problems with blood clotting too easily:        Skin    Rashes or ulcers:        Constitutional    Fever or chills:      PHYSICAL EXAM: Vitals:   04/27/16 1515  BP: 129/86  Pulse: 96  Temp: 97 F (36.1 C)  TempSrc: Oral  SpO2: 95%  Weight: 178 lb (80.7 kg)  Height: 6' (1.829 m)    GENERAL: The patient is a well-nourished male, in no acute distress. The vital signs are documented above. CARDIAC: There is a regular rate and rhythm.  VASCULAR: Palpable thrill left AV thigh graft. Seroma left groin. Palpable left or Salas pedis pulse. PULMONARY: Nonlabored respiratory effort. MUSCULOSKELETAL: No appreciable swelling left leg. NEUROLOGIC: No focal weakness or paresthesias are  detected. PSYCHIATRIC: The patient has a normal affect.  MEDICAL ISSUES: Left leg swelling Left groin seroma Status post left AV thigh graft with multiple revisions  The patient has complained of left leg swelling for the past 4 weeks. His left groin seroma continues to improve. Plan for left thigh shuntogram with possible intervention to evaluate for venous stenosis. He denies any issues with dialysis. He dialyzes on Mondays, Wednesdays and Fridays. His procedure will be scheduled for 05/05/2016 with Dr. Bridgett Larsson.  Virgina Jock, PA-C [facility] [place]   History and exam details as above. Patient has a somewhat difficult historian due to discrepancies and when the swelling in the leg is occurring and whether or not is improving. Overall his left leg was only slightly more swollen than his right. However he states that this gets worse after he is standing on all day at work. I believe the best option at this point would be to do a shuntogram potential angioplasty if he has venous outflow stenosis. Otherwise we will consider whether or not a compression stocking may help his overall symptoms. The seroma in his left groin is overall improved although still about 5 cm in diameter. Consideration could also be given for whether or not this would be compressing his outflow. He did have an angioplasty of the venous outflow to 7 mm by interventional radiology in June of this year.  Ruta Hinds, MD [facility] [place] Office: [phone] Pager: [phone]

## 2016-05-05 ENCOUNTER — Ambulatory Visit (HOSPITAL_COMMUNITY)
Admission: RE | Admit: 2016-05-05 | Discharge: 2016-05-05 | Disposition: A | Discharge status: Home / Self Care | Payer: Medicare Other | Source: Ambulatory Visit | Attending: Vascular Surgery | Admitting: Vascular Surgery

## 2016-05-05 ENCOUNTER — Encounter (HOSPITAL_COMMUNITY): Payer: Self-pay | Admitting: Vascular Surgery

## 2016-05-05 ENCOUNTER — Other Ambulatory Visit: Payer: Self-pay | Admitting: *Deleted

## 2016-05-05 ENCOUNTER — Encounter (HOSPITAL_COMMUNITY)
Admission: RE | Disposition: A | Discharge status: Home / Self Care | Payer: Self-pay | Source: Ambulatory Visit | Attending: Vascular Surgery

## 2016-05-05 DIAGNOSIS — Y832 Surgical operation with anastomosis, bypass or graft as the cause of abnormal reaction of the patient, or of later complication, without mention of misadventure at the time of the procedure: Secondary | ICD-10-CM | POA: Insufficient documentation

## 2016-05-05 DIAGNOSIS — D649 Anemia, unspecified: Secondary | ICD-10-CM | POA: Insufficient documentation

## 2016-05-05 DIAGNOSIS — T82858A Stenosis of vascular prosthetic devices, implants and grafts, initial encounter: Secondary | ICD-10-CM

## 2016-05-05 DIAGNOSIS — Z87891 Personal history of nicotine dependence: Secondary | ICD-10-CM | POA: Insufficient documentation

## 2016-05-05 DIAGNOSIS — E079 Disorder of thyroid, unspecified: Secondary | ICD-10-CM | POA: Insufficient documentation

## 2016-05-05 DIAGNOSIS — Z4931 Encounter for adequacy testing for hemodialysis: Secondary | ICD-10-CM

## 2016-05-05 DIAGNOSIS — I724 Aneurysm of artery of lower extremity: Secondary | ICD-10-CM | POA: Insufficient documentation

## 2016-05-05 DIAGNOSIS — N186 End stage renal disease: Secondary | ICD-10-CM

## 2016-05-05 DIAGNOSIS — F329 Major depressive disorder, single episode, unspecified: Secondary | ICD-10-CM | POA: Insufficient documentation

## 2016-05-05 DIAGNOSIS — Z992 Dependence on renal dialysis: Secondary | ICD-10-CM | POA: Insufficient documentation

## 2016-05-05 DIAGNOSIS — I120 Hypertensive chronic kidney disease with stage 5 chronic kidney disease or end stage renal disease: Secondary | ICD-10-CM | POA: Insufficient documentation

## 2016-05-05 HISTORY — PX: VENOGRAM: SHX5497

## 2016-05-05 HISTORY — PX: PERIPHERAL VASCULAR CATHETERIZATION: SHX172C

## 2016-05-05 LAB — POCT I-STAT, CHEM 8
BUN: 88 mg/dL — ABNORMAL HIGH (ref 6–20)
CALCIUM ION: 0.89 mmol/L — AB (ref 1.13–1.30)
Chloride: 102 mmol/L (ref 101–111)
Creatinine, Ser: >18 mg/dL — ABNORMAL HIGH (ref 0.61–1.24)
GLUCOSE: 82 mg/dL (ref 65–99)
HCT: 38 % — ABNORMAL LOW (ref 39.0–52.0)
HEMOGLOBIN: 12.9 g/dL — AB (ref 13.0–17.0)
POTASSIUM: 4.9 mmol/L (ref 3.5–5.1)
SODIUM: 137 mmol/L (ref 135–145)
TCO2: 22 mmol/L (ref 0–100)

## 2016-05-05 SURGERY — A/V SHUNTOGRAM/FISTULAGRAM
Anesthesia: LOCAL | Site: Thigh

## 2016-05-05 MED ORDER — CLOPIDOGREL BISULFATE 75 MG PO TABS: 75.0000 mg | ORAL_TABLET | Freq: Every day | ORAL | 11 refills | Status: DC

## 2016-05-05 MED ORDER — HEPARIN (PORCINE) IN NACL 2-0.9 UNIT/ML-% IJ SOLN
INTRAMUSCULAR | Status: AC
  Filled 2016-05-05: qty 500

## 2016-05-05 MED ORDER — SODIUM CHLORIDE 0.9% FLUSH: 3.0000 mL | INTRAVENOUS | Status: DC | PRN

## 2016-05-05 MED ORDER — ACETAMINOPHEN 325 MG PO TABS: 650.0000 mg | ORAL_TABLET | ORAL | Status: DC | PRN

## 2016-05-05 MED ORDER — LIDOCAINE HCL (PF) 1 % IJ SOLN
INTRAMUSCULAR | Status: DC | PRN
  Administered 2016-05-05: 10 mL

## 2016-05-05 MED ORDER — HEPARIN SODIUM (PORCINE) 1000 UNIT/ML IJ SOLN
INTRAMUSCULAR | Status: DC | PRN
  Administered 2016-05-05: 3000 [IU] via INTRAVENOUS

## 2016-05-05 MED ORDER — HEPARIN SODIUM (PORCINE) 1000 UNIT/ML IJ SOLN
INTRAMUSCULAR | Status: AC
  Filled 2016-05-05: qty 1

## 2016-05-05 MED ORDER — IODIXANOL 320 MG/ML IV SOLN
INTRAVENOUS | Status: DC | PRN
  Administered 2016-05-05: 57 mL via INTRAVENOUS

## 2016-05-05 MED ORDER — HEPARIN (PORCINE) IN NACL 2-0.9 UNIT/ML-% IJ SOLN
INTRAMUSCULAR | Status: DC | PRN
  Administered 2016-05-05: 500 mL

## 2016-05-05 MED ORDER — LIDOCAINE HCL (PF) 1 % IJ SOLN
INTRAMUSCULAR | Status: AC
  Filled 2016-05-05: qty 30

## 2016-05-05 SURGICAL SUPPLY — 17 items
BAG SNAP BAND KOVER 36X36 (MISCELLANEOUS) ×4 IMPLANT
BALLN MUSTANG 6X80X75 (BALLOONS) ×4
BALLN MUSTANG 8.0X40 75 (BALLOONS) ×4
BALLOON MUSTANG 6X80X75 (BALLOONS) IMPLANT
BALLOON MUSTANG 8.0X40 75 (BALLOONS) IMPLANT
COVER DOME SNAP 22 D (MISCELLANEOUS) ×4 IMPLANT
COVER PRB 48X5XTLSCP FOLD TPE (BAG) ×3 IMPLANT
COVER PROBE 5X48 (BAG) ×4
KIT ENCORE 26 ADVANTAGE (KITS) ×1 IMPLANT
KIT MICROINTRODUCER STIFF 5F (SHEATH) ×1 IMPLANT
PROTECTION STATION PRESSURIZED (MISCELLANEOUS) ×4
SHEATH PINNACLE R/O II 6F 4CM (SHEATH) ×1 IMPLANT
STATION PROTECTION PRESSURIZED (MISCELLANEOUS) ×3 IMPLANT
STOPCOCK MORSE 400PSI 3WAY (MISCELLANEOUS) ×4 IMPLANT
TRAY PV CATH (CUSTOM PROCEDURE TRAY) ×4 IMPLANT
TUBING CIL FLEX 10 FLL-RA (TUBING) ×4 IMPLANT
WIRE BENTSON .035X145CM (WIRE) ×1 IMPLANT

## 2016-05-05 NOTE — Interval H&P Note (Signed)
[facility] of [place]  History and Physical Update  The patient was interviewed and re-examined.  The patient's previous History and Physical has been reviewed and is unchanged from Dr. Nona Dell consult.  There is no change in the plan of care: L thigh shuntogram, possible intervention.   I discussed with the patient the nature of angiographic procedures, especially the limited patencies of any endovascular intervention.    The patient is aware of that the risks of an angiographic procedure include but are not limited to: bleeding, infection, access site complications, renal failure, embolization, rupture of vessel, dissection, arteriovenous fistula, possible need for emergent surgical intervention, possible need for surgical procedures to treat the patient's pathology, anaphylactic reaction to contrast, and stroke and death.    The patient is aware of the risks and agrees to proceed.  Adele Barthel, MD [facility] [place] Office: [phone] Pager: [phone]  05/05/2016, 8:21 AM

## 2016-05-05 NOTE — Discharge Instructions (Signed)
Thigh graft Angiogram, Care After Refer to this sheet in the next few weeks. These instructions provide you with information about caring for yourself after your procedure. Your health care provider may also give you more specific instructions. Your treatment has been planned according to current medical practices, but problems sometimes occur. Call your health care provider if you have any problems or questions after your procedure. WHAT TO EXPECT AFTER THE PROCEDURE After your procedure, it is typical to have the following:  Bruising at the catheter insertion site that usually fades within 1-2 weeks.  Blood collecting in the tissue (hematoma) that may be painful to the touch. It should usually decrease in size and tenderness within 1-2 weeks. HOME CARE INSTRUCTIONS  Take medicines only as directed by your health care provider.  You may shower 24-48 hours after the procedure or as directed by your health care provider. Remove the bandage (dressing) and gently wash the site with plain soap and water. Pat the area dry with a clean towel. Do not rub the site, because this may cause bleeding.  Do not take baths, swim, or use a hot tub until your health care provider approves.  Check your insertion site every day for redness, swelling, or drainage.  Do not apply powder or lotion to the site.  Do not lift over 10 lb (4.5 kg) for 5 days after your procedure or as directed by your health care provider.  Ask your health care provider when it is okay to:  Return to work or school.  Resume usual physical activities or sports.  Resume sexual activity.  Do not drive home if you are discharged the same day as the procedure. Have someone else drive you.  You may drive 24 hours after the procedure unless otherwise instructed by your health care provider.  Do not operate machinery or power tools for 24 hours after the procedure or as directed by your health care provider.  If your procedure was  done as an outpatient procedure, which means that you went home the same day as your procedure, a responsible adult should be with you for the first 24 hours after you arrive home.  Keep all follow-up visits as directed by your health care provider. This is important. SEEK MEDICAL CARE IF:  You have a fever.  You have chills.  You have increased bleeding from the catheter insertion site. Hold pressure on the site. SEEK IMMEDIATE MEDICAL CARE IF:  You have unusual pain at the catheter insertion site.  You have redness, warmth, or swelling at the catheter insertion site.  You have drainage (other than a small amount of blood on the dressing) from the catheter insertion site.  The catheter insertion site is bleeding, and the bleeding does not stop after 30 minutes of holding steady pressure on the site.  The area near or just beyond the catheter insertion site becomes pale, cool, tingly, or numb.   This information is not intended to replace advice given to you by your health care provider. Make sure you discuss any questions you have with your health care provider.   Document Released: 04/07/2005 Document Revised: 10/10/2014 Document Reviewed: 02/20/2013 Elsevier Interactive Patient Education [employer].

## 2016-05-05 NOTE — H&P (View-Only) (Signed)
[facility]ascular and Vein Specialist of [facility]  Patient name: [name] MRN: [id number] DOB: 1971-01-21 Sex: male  REASON FOR VISIT: left leg swelling  HPI: [name] is a 45 y.o. male who presents with a 4 week history of left leg swelling. He denies any pain into his left leg. He has a left AV thigh graft with repair of left femoral pseudoaneurysm on 11/09/2015. He also previously underwent thrombectomy and revision on 08/23/2015 and 08/18/2015. The patient has had a seroma since 12/31/2015. This has steadily improved. The patient complains of entire left leg swelling especially on the days that he works. He is standing most of the day. He dialyzes on Mondays, Wednesdays and Fridays. He denies any issues with dialysis.  His past medical history of hypertension which is stable.  Past Medical History:  Diagnosis Date  . Anemia   . Cough    DRY    . Depression   . ESRD on hemodialysis ([place])    HD [facility] MWF  . Headache(784.0)   . Hypertension   . Muscle spasms of neck    BACK, NECK  . Thyroid disease     History reviewed. No pertinent family history.  SOCIAL HISTORY: Social History  Substance Use Topics  . Smoking status: Former Smoker    Quit date: 03/21/1986  . Smokeless tobacco: Never Used  . Alcohol use No    Allergies  Allergen Reactions  . Lisinopril Cough    Current Outpatient Prescriptions  Medication Sig Dispense Refill  . acetaminophen (TYLENOL) 500 MG tablet Take 1,000 mg by mouth every 6 (six) hours as needed for pain. For pain    . calcium carbonate (TUMS - DOSED IN MG ELEMENTAL CALCIUM) 500 MG chewable tablet Chew 2 tablets (400 mg of elemental calcium total) by mouth 2 (two) times daily between meals. 120 tablet 3  . diphenhydrAMINE (BENADRYL) 25 mg capsule Take 1 capsule (25 mg total) by mouth every 6 (six) hours as needed for itching. 30 capsule 0  . pantoprazole (PROTONIX) 40 MG tablet Take 1 tablet (40 mg total) by mouth  daily. 30 tablet 0  . oxyCODONE-acetaminophen (ROXICET) 5-325 MG tablet Take 1 tablet by mouth every 4 (four) hours as needed for moderate pain. (Patient not taking: Reported on 12/31/2015) 30 tablet 0   No current facility-administered medications for this visit.     REVIEW OF SYSTEMS:  [X]  denotes positive finding, [ ]  denotes negative finding Cardiac  Comments:  Chest pain or chest pressure:    Shortness of breath upon exertion:    Short of breath when lying flat:    Irregular heart rhythm:        Vascular    Pain in calf, thigh, or hip brought on by ambulation:    Pain in feet at night that wakes you up from your sleep:     Blood clot in your veins:    Leg swelling:  x       Pulmonary    Oxygen at home:    Productive cough:     Wheezing:         Neurologic    Sudden weakness in arms or legs:     Sudden numbness in arms or legs:     Sudden onset of difficulty speaking or slurred speech:    Temporary loss of vision in one eye:     Problems with dizziness:         Gastrointestinal  Blood in stool:     Vomited blood:         Genitourinary    Burning when urinating:     Blood in urine:        Psychiatric    Major depression:         Hematologic    Bleeding problems:    Problems with blood clotting too easily:        Skin    Rashes or ulcers:        Constitutional    Fever or chills:      PHYSICAL EXAM: Vitals:   04/27/16 1515  BP: 129/86  Pulse: 96  Temp: 97 F (36.1 C)  TempSrc: Oral  SpO2: 95%  Weight: 178 lb (80.7 kg)  Height: 6' (1.829 m)    GENERAL: The patient is a well-nourished male, in no acute distress. The vital signs are documented above. CARDIAC: There is a regular rate and rhythm.  VASCULAR: Palpable thrill left AV thigh graft. Seroma left groin. Palpable left or Salas pedis pulse. PULMONARY: Nonlabored respiratory effort. MUSCULOSKELETAL: No appreciable swelling left leg. NEUROLOGIC: No focal weakness or paresthesias are  detected. PSYCHIATRIC: The patient has a normal affect.  MEDICAL ISSUES: Left leg swelling Left groin seroma Status post left AV thigh graft with multiple revisions  The patient has complained of left leg swelling for the past 4 weeks. His left groin seroma continues to improve. Plan for left thigh shuntogram with possible intervention to evaluate for venous stenosis. He denies any issues with dialysis. He dialyzes on Mondays, Wednesdays and Fridays. His procedure will be scheduled for 05/05/2016 with Dr. Bridgett Larsson.  Virgina Jock, PA-C [facility] [place]   History and exam details as above. Patient has a somewhat difficult historian due to discrepancies and when the swelling in the leg is occurring and whether or not is improving. Overall his left leg was only slightly more swollen than his right. However he states that this gets worse after he is standing on all day at work. I believe the best option at this point would be to do a shuntogram potential angioplasty if he has venous outflow stenosis. Otherwise we will consider whether or not a compression stocking may help his overall symptoms. The seroma in his left groin is overall improved although still about 5 cm in diameter. Consideration could also be given for whether or not this would be compressing his outflow. He did have an angioplasty of the venous outflow to 7 mm by interventional radiology in June of this year.  Ruta Hinds, MD [facility] of [place] Office: [phone] Pager: [phone]

## 2016-05-05 NOTE — Op Note (Signed)
     OPERATIVE NOTE   PROCEDURE: 1.  Left thigh arteriovenous graft cannulation under ultrasound guidance 2.  Left thigh shuntogram 3.  Venoplasty of femoral vein x 2 (6 mm x 80 mm, 10 mm x 40 mm)  PRE-OPERATIVE DIAGNOSIS: Left thigh arteriovenous graft outflow stenosis  POST-OPERATIVE DIAGNOSIS: same as above   SURGEON: Adele Barthel, MD  ANESTHESIA: local  ESTIMATED BLOOD LOSS: 5 cc  FINDING(S): 1. Patent left thigh arteriovenous graft with <30% stenosis at venous and arterial anastomosis 2. Patent iliac venous system with patent distal inferior vena cava 3. 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 male who presents with left leg swelling in setting of left thigh arteriovenous graft.  The patient is scheduled for left thigh shuntogram.  The patient is aware the risks include but are not limited to: bleeding, infection, thrombosis of the cannulated access, and possible anaphylactic reaction to the contrast.  The patient is aware of the risks of the procedure and elects to proceed forward.  DESCRIPTION: After full informed written consent was obtained, the patient was brought back to the angiography suite and placed supine upon the angiography table.  The patient was connected to monitoring equipment.  The left thigh was prepped and draped in the standard fashion for a percutaneous access intervention.  Under ultrasound guidance, the left thigh arteriovenous graft was cannulated with a micropuncture needle.  The microwire was advanced into the fistula and the needle was exchanged for the a microsheath, which was lodged 2 cm into the access.  The wire was removed and the sheath was connected to the IV extension tubing.  Hand injections were completed to image the access from the apical of the thigh arteriovenous graft up to the level of  inferior vena cava.  Based on the images, this patient will need: venoplasty of the venous anastomosis and left external iliac vein.  A Bentson wire was advanced into the arteriovenous graft and the sheath was exchanged for a short 6-Fr sheath.  Based on the imaging, a 6 mm x 80 mm angioplasty balloon was selected.  The balloon was centered around the venous anastomosis and inflated to 18 ATM for 2 minutes.  I then deflated the balloon and centered it on the external iliac vein stenosis.  I inflated the balloon to 18 atm for 2 minutes.  On completion imaging, a >30% residual stenosis was present in the external iliac vein but <30% stenosis at the venous anastomosis.  I refinflated the balloon in the graft and completed a reflux injection to image the rest of the arteriovenous graft.  At this point, the balloon was exchanged for a 80 mm x 40 mm angioplasty balloon.  The balloon was centered around the external iliac vein stenosis and inflated to 18 ATM for 2 minutes.  On completion imaging, a <30% residual stenosis was present.  Based on the completion imaging, no further intervention is necessary.  The wire and balloon were removed from the sheath.  A 4-0 Monocryl purse-string suture was sewn around the sheath.  The sheath was removed while tying down the suture.  A sterile bandage was applied to the puncture site.   COMPLICATIONS: none  CONDITION: stable   Adele Barthel, MD [facility] Office: [phone] Pager: [phone]  05/05/2016 8:58 AM

## 2016-06-01 ENCOUNTER — Emergency Department (HOSPITAL_COMMUNITY)
Admission: EM | Admit: 2016-06-01 | Discharge: 2016-06-01 | Disposition: A | Discharge status: Home / Self Care | Payer: Medicare Other | Attending: Emergency Medicine | Admitting: Emergency Medicine

## 2016-06-01 ENCOUNTER — Emergency Department (HOSPITAL_COMMUNITY): Payer: Medicare Other

## 2016-06-01 ENCOUNTER — Encounter (HOSPITAL_COMMUNITY): Payer: Self-pay

## 2016-06-01 DIAGNOSIS — Z87891 Personal history of nicotine dependence: Secondary | ICD-10-CM | POA: Diagnosis not present

## 2016-06-01 DIAGNOSIS — N186 End stage renal disease: Secondary | ICD-10-CM | POA: Insufficient documentation

## 2016-06-01 DIAGNOSIS — Z992 Dependence on renal dialysis: Secondary | ICD-10-CM | POA: Diagnosis not present

## 2016-06-01 DIAGNOSIS — I120 Hypertensive chronic kidney disease with stage 5 chronic kidney disease or end stage renal disease: Secondary | ICD-10-CM | POA: Diagnosis not present

## 2016-06-01 DIAGNOSIS — R1033 Periumbilical pain: Secondary | ICD-10-CM | POA: Diagnosis not present

## 2016-06-01 DIAGNOSIS — R109 Unspecified abdominal pain: Secondary | ICD-10-CM | POA: Diagnosis present

## 2016-06-01 LAB — CBC
HCT: 38 % — ABNORMAL LOW (ref 39.0–52.0)
HEMOGLOBIN: 12 g/dL — AB (ref 13.0–17.0)
MCH: 30.7 pg (ref 26.0–34.0)
MCHC: 31.6 g/dL (ref 30.0–36.0)
MCV: 97.2 fL (ref 78.0–100.0)
PLATELETS: 266 10*3/uL (ref 150–400)
RBC: 3.91 MIL/uL — AB (ref 4.22–5.81)
RDW: 15.8 % — ABNORMAL HIGH (ref 11.5–15.5)
WBC: 5.9 10*3/uL (ref 4.0–10.5)

## 2016-06-01 LAB — COMPREHENSIVE METABOLIC PANEL
ALT: 13 U/L — AB (ref 17–63)
AST: 15 U/L (ref 15–41)
Albumin: 3.8 g/dL (ref 3.5–5.0)
Alkaline Phosphatase: 66 U/L (ref 38–126)
Anion gap: 17 — ABNORMAL HIGH (ref 5–15)
BUN: 61 mg/dL — ABNORMAL HIGH (ref 6–20)
CHLORIDE: 98 mmol/L — AB (ref 101–111)
CO2: 26 mmol/L (ref 22–32)
CREATININE: 16.49 mg/dL — AB (ref 0.61–1.24)
Calcium: 9.2 mg/dL (ref 8.9–10.3)
GFR calc non Af Amer: 3 mL/min — ABNORMAL LOW (ref >60)
GFR, EST AFRICAN AMERICAN: 4 mL/min — AB (ref >60)
Glucose, Bld: 84 mg/dL (ref 65–99)
Potassium: 4.3 mmol/L (ref 3.5–5.1)
SODIUM: 141 mmol/L (ref 135–145)
Total Bilirubin: 0.4 mg/dL (ref 0.3–1.2)
Total Protein: 8.2 g/dL — ABNORMAL HIGH (ref 6.5–8.1)

## 2016-06-01 LAB — LIPASE, BLOOD: LIPASE: 45 U/L (ref 11–51)

## 2016-06-01 MED ORDER — IBUPROFEN 800 MG PO TABS
800.0000 mg | ORAL_TABLET | Freq: Once | ORAL | Status: AC
  Administered 2016-06-01: 800 mg via ORAL
  Filled 2016-06-01: qty 1

## 2016-06-01 MED ORDER — GI COCKTAIL ~~LOC~~
30.0000 mL | Freq: Once | ORAL | Status: AC
  Administered 2016-06-01: 30 mL via ORAL
  Filled 2016-06-01: qty 30

## 2016-06-01 MED ORDER — ACETAMINOPHEN 500 MG PO TABS
1000.0000 mg | ORAL_TABLET | Freq: Once | ORAL | Status: AC
  Administered 2016-06-01: 1000 mg via ORAL
  Filled 2016-06-01: qty 2

## 2016-06-01 NOTE — Discharge Instructions (Signed)
Take 4 over the counter ibuprofen tablets 3 times a day or 2 over-the-counter naproxen tablets twice a day for pain. Also take tylenol 1000mg(2 extra strength) four times a day.    

## 2016-06-01 NOTE — ED Triage Notes (Signed)
Pr Pt, Pt is coming from home with complaint of drinking a protein drink on Monday and ten minutes later starting to have a throbbing LUQ abdominal pain that has continued to come and be sharp. Hx of dialysis patient. Denies N/V/D.

## 2016-06-01 NOTE — ED Provider Notes (Signed)
[facility] DEPT Provider Note   CSN: [id number] Arrival date & time: 06/01/16  [id number]     History   Chief Complaint Chief Complaint  Patient presents with  . Abdominal Pain    HPI [name] is a 45 y.o. male.  45 yo M with a chief complaint of left side pain after eating a protein drink. This started yesterday after dialysis. Worse with deep breaths movement palpation. Denies injury. Having a mild cough denies fevers or chills. Has been able eat and drink without difficulty. Denies vomiting or diarrhea. Denies radiation of pain. Pain is sharp in nature.   The history is provided by the patient.  Abdominal Pain   This is a new problem. The current episode started yesterday. The problem occurs constantly. The problem has not changed since onset.The pain is associated with eating. The pain is located in the LUQ. The pain is at a severity of 8/10. The pain is moderate. Associated symptoms include arthralgias and myalgias. Pertinent negatives include fever, diarrhea, vomiting and headaches. The symptoms are aggravated by deep breathing and palpation. Nothing relieves the symptoms.    Past Medical History:  Diagnosis Date  . Anemia   . Cough    DRY    . Depression   . ESRD on hemodialysis ([place])    HD [facility] MWF  . Headache(784.0)   . Hypertension   . Muscle spasms of neck    BACK, NECK  . Thyroid disease     Patient Active Problem List   Diagnosis Date Noted  . 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 Cellulitis of left thigh 08/26/2015  . Abdominal pain 08/26/2015  . Complication from renal dialysis device   . ESRD (end stage renal disease) on dialysis ([facility])   . ESRD (end stage renal disease) ([place])   . Sepsis ([place]) 12/14/2014  . Hypotension 12/14/2014  . Diarrhea   . Hypocalcemia   . Secondary hyperparathyroidism of renal origin ([place]) 04/24/2014  . Hyperparathyroidism, secondary ([place]) 03/21/2014    Past Surgical History:    Procedure Laterality Date  . ARTERIOVENOUS GRAFT PLACEMENT Left    "forearm, it's not working; thigh"   . ARTERIOVENOUS GRAFT PLACEMENT Left 11/09/2015  . FALSE ANEURYSM REPAIR Left 11/09/2015   Procedure: REPAIR OF LEFT FEMORAL PSEUDOANEURYSM; REVISION  OF LEFT THIGH ARTERIOVENOUS GRAFT USING 6MM X 10 CM GORETEX GRAFT ;  Surgeon: [name], MD;  Location: [facility];  Service: Vascular;  Laterality: Left;  . 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 PERIPHERAL VASCULAR CATHETERIZATION N/A 05/05/2016   Procedure: A/V Shuntogram;  Surgeon: Conrad [place], MD;  Location: [facility];  Service: Cardiovascular;  Laterality: N/A;  . PERIPHERAL VASCULAR CATHETERIZATION Left 05/05/2016   Procedure: Peripheral Vascular Balloon Angioplasty;  Surgeon: Conrad Bland, MD;  Location: [facility] CV LAB;  Service: Cardiovascular;  Laterality: Left;  . PSEUDOANEURYSM REPAIR Left 11/09/2015  . THROMBECTOMY / ARTERIOVENOUS GRAFT REVISION Left 08/18/2015   thigh  . THROMBECTOMY AND REVISION OF ARTERIOVENTOUS (AV) GORETEX  GRAFT Left 08/23/2015   Procedure: THROMBECTOMY AND REVISION OF ARTERIOVENTOUS (AV) GORETEX  GRAFT LEFT THIGH ;  Surgeon: [name], MD;  Location: [facility];  Service: Vascular;  Laterality: Left;  . THROMBECTOMY W/ EMBOLECTOMY Left 08/18/2015   Procedure: THROMBECTOMY  AND REVISION  ARTERIOVENOUS GORE-TEX GRAFT/LEFT THIGH;  Surgeon: [name], MD;  Location: [facility];  Service: Vascular;  Laterality: Left;  Marland Kitchen VENOGRAM Left 05/05/2016   Procedure: Venogram;  Surgeon: Conrad [place], MD;  Location: [facility] CV LAB;  Service: Cardiovascular;  Laterality: Left;  lower extremity       Home Medications    Prior to Admission medications   Medication Sig Start Date End Date Taking? Authorizing Provider  acetaminophen  (TYLENOL) 500 MG tablet Take 1,000 mg by mouth every 6 (six) hours as needed for pain. For pain   Yes Historical Provider, MD  b complex-vitamin c-folic acid (NEPHRO-VITE) 0.8 MG TABS tablet Take 1 tablet by mouth daily.   Yes Historical Provider, MD    Family History No family history on file.  Social History Social History  Substance Use Topics  . Smoking status: Former Smoker    Quit date: 03/21/1986  . Smokeless tobacco: Never Used  . Alcohol use No     Allergies   Lisinopril   Review of Systems Review of Systems  Constitutional: Negative for chills and fever.  HENT: Negative for congestion and facial swelling.   Eyes: Negative for discharge and visual disturbance.  Respiratory: Negative for shortness of breath.   Cardiovascular: Negative for chest pain and palpitations.  Gastrointestinal: Positive for abdominal pain. Negative for diarrhea and vomiting.  Musculoskeletal: Positive for arthralgias and myalgias.  Skin: Negative for color change and rash.  Neurological: Negative for tremors, syncope and headaches.  Psychiatric/Behavioral: Negative for confusion and dysphoric mood.     Physical Exam Updated Vital Signs BP 125/83   Pulse 76   Temp 97.9 F (36.6 C) (Oral)   Resp 16   SpO2 100%   Physical Exam  Constitutional: He is oriented to person, place, and time. He appears well-developed and well-nourished.  HENT:  Head: Normocephalic and atraumatic.  Eyes: Conjunctivae and EOM are normal. Pupils are equal, round, and reactive to light.  Neck: Normal range of motion. No JVD present.  Cardiovascular: Normal rate and regular rhythm.   Pulmonary/Chest: Effort normal. No stridor. No respiratory distress.  Abdominal: He exhibits no distension. There is tenderness (mild LUQ). There is no guarding.  Musculoskeletal: Normal range of motion. He exhibits tenderness (TTP about the left rib margin ribs 10-12. ). He exhibits no edema.  Neurological: He is alert and  oriented to person, place, and time.  Skin: Skin is warm and dry.  Psychiatric: He has a normal mood and affect. His behavior is normal.     ED Treatments / Results  Labs (all labs ordered are listed, but only abnormal results are displayed) Labs Reviewed  COMPREHENSIVE METABOLIC PANEL - Abnormal; Notable for the following:       Result Value   Chloride 98 (*)    BUN 61 (*)    Creatinine, Ser 16.49 (*)    Total Protein 8.2 (*)    ALT 13 (*)    GFR calc non Af Amer 3 (*)    GFR calc Af Amer 4 (*)    Anion gap 17 (*)    All other components within normal limits  CBC - Abnormal; Notable for the following:    RBC 3.91 (*)    Hemoglobin 12.0 (*)    HCT 38.0 (*)    RDW 15.8 (*)    All other components within normal limits  LIPASE, BLOOD    EKG  EKG Interpretation None       Radiology  Dg Ribs Unilateral W/chest Left  Result Date: 06/01/2016 CLINICAL DATA:  Posterior and lateral LEFT rib pain since Monday, no known injury EXAM: LEFT RIBS AND CHEST - 3+ VIEW COMPARISON:  Chest radiograph 12/13/2014 FINDINGS: Normal heart size, mediastinal contours, and pulmonary vascularity. Lungs clear. No pleural effusion or pneumothorax. Surgical clips at the paratracheal regions bilaterally at the inferior cervical region, likely due to reported prior parathyroid surgery. BB placed at site of symptoms lower LEFT chest. No rib fracture or bone destruction. IMPRESSION: No acute abnormalities. Electronically Signed   By: Lavonia Dana M.D.   On: 06/01/2016 10:34    Procedures Procedures (including critical care time)  Medications Ordered in ED Medications  acetaminophen (TYLENOL) tablet 1,000 mg (1,000 mg Oral Given 06/01/16 1006)  ibuprofen (ADVIL,MOTRIN) tablet 800 mg (800 mg Oral Given 06/01/16 1006)  gi cocktail (Maalox,Lidocaine,Donnatal) (30 mLs Oral Given 06/01/16 1007)     Initial Impression / Assessment and Plan / ED Course  I have reviewed the triage vital signs and the nursing  notes.  Pertinent labs & imaging results that were available during my care of the patient were reviewed by me and considered in my medical decision making (see chart for details).  Clinical Course    45 yo M With a chief complaint of left side pain. Appears to be muscle skeletal nature. Since started with eating will obtain abdominal labs. Chest x-ray.   Labs with elevated anion gap, no acidosis.  ? Uremia as the cause, doubt intrabdominal pathology on my exam.  CXR without effusion, pna, or rib fx.  Treat as muscular strain.  PCP follow up.   11:35 AM:  I have discussed the diagnosis/risks/treatment options with the patient and believe the pt to be eligible for discharge home to follow-up with PCP. We also discussed returning to the ED immediately if new or worsening sx occur. We discussed the sx which are most concerning (e.g., sudden worsening pain, fever, inability to tolerate by mouth) that necessitate immediate return. Medications administered to the patient during their visit and any new prescriptions provided to the patient are listed below.  Medications given during this visit Medications  acetaminophen (TYLENOL) tablet 1,000 mg (1,000 mg Oral Given 06/01/16 1006)  ibuprofen (ADVIL,MOTRIN) tablet 800 mg (800 mg Oral Given 06/01/16 1006)  gi cocktail (Maalox,Lidocaine,Donnatal) (30 mLs Oral Given 06/01/16 1007)     The patient appears reasonably screen and/or stabilized for discharge and I doubt any other medical condition or other [facility] requiring further screening, evaluation, or treatment in the ED at this time prior to discharge.    Final Clinical Impressions(s) / ED Diagnoses   Final diagnoses:  Periumbilical abdominal pain    New Prescriptions Discharge Medication List as of 06/01/2016 11:10 AM       Deno Etienne, DO 06/01/16 1135

## 2016-06-30 ENCOUNTER — Telehealth: Payer: Self-pay | Admitting: Vascular Surgery

## 2016-06-30 IMAGING — CR DG ANKLE COMPLETE 3+V*R*
3 series · 3 of 3 positions shown · non-contrast
Comparison: None.

CLINICAL DATA: Ankle pain for 3 months without injury

EXAM:
RIGHT ANKLE - COMPLETE 3+ VIEW

[t ankle joint ap right]
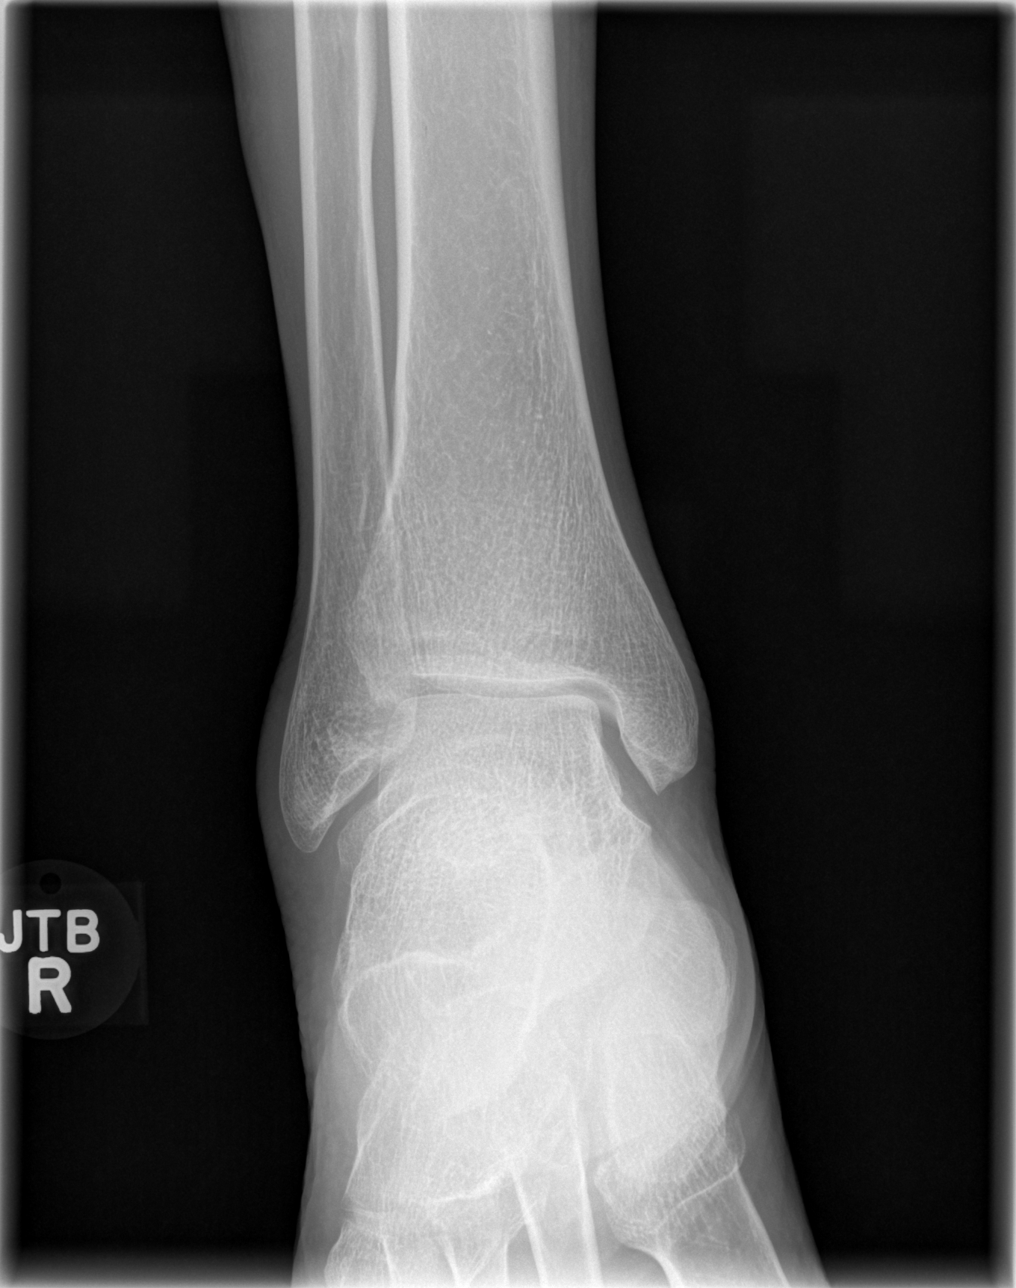

[t ankle joint oblique right]
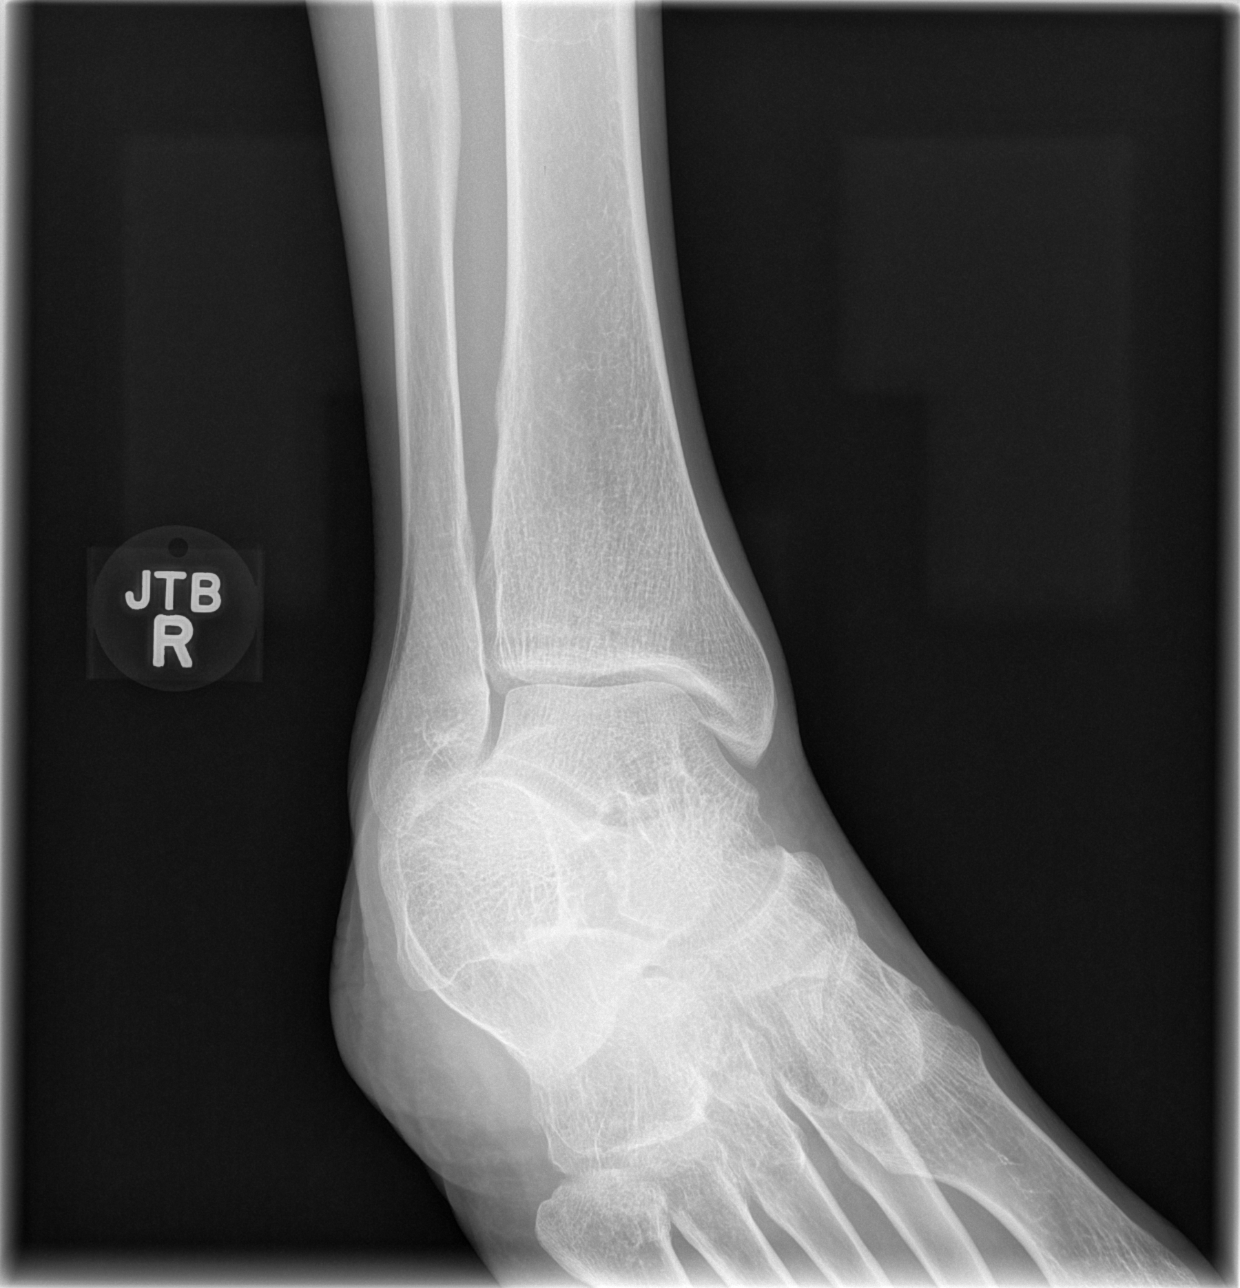

[t ankle joint lat right]
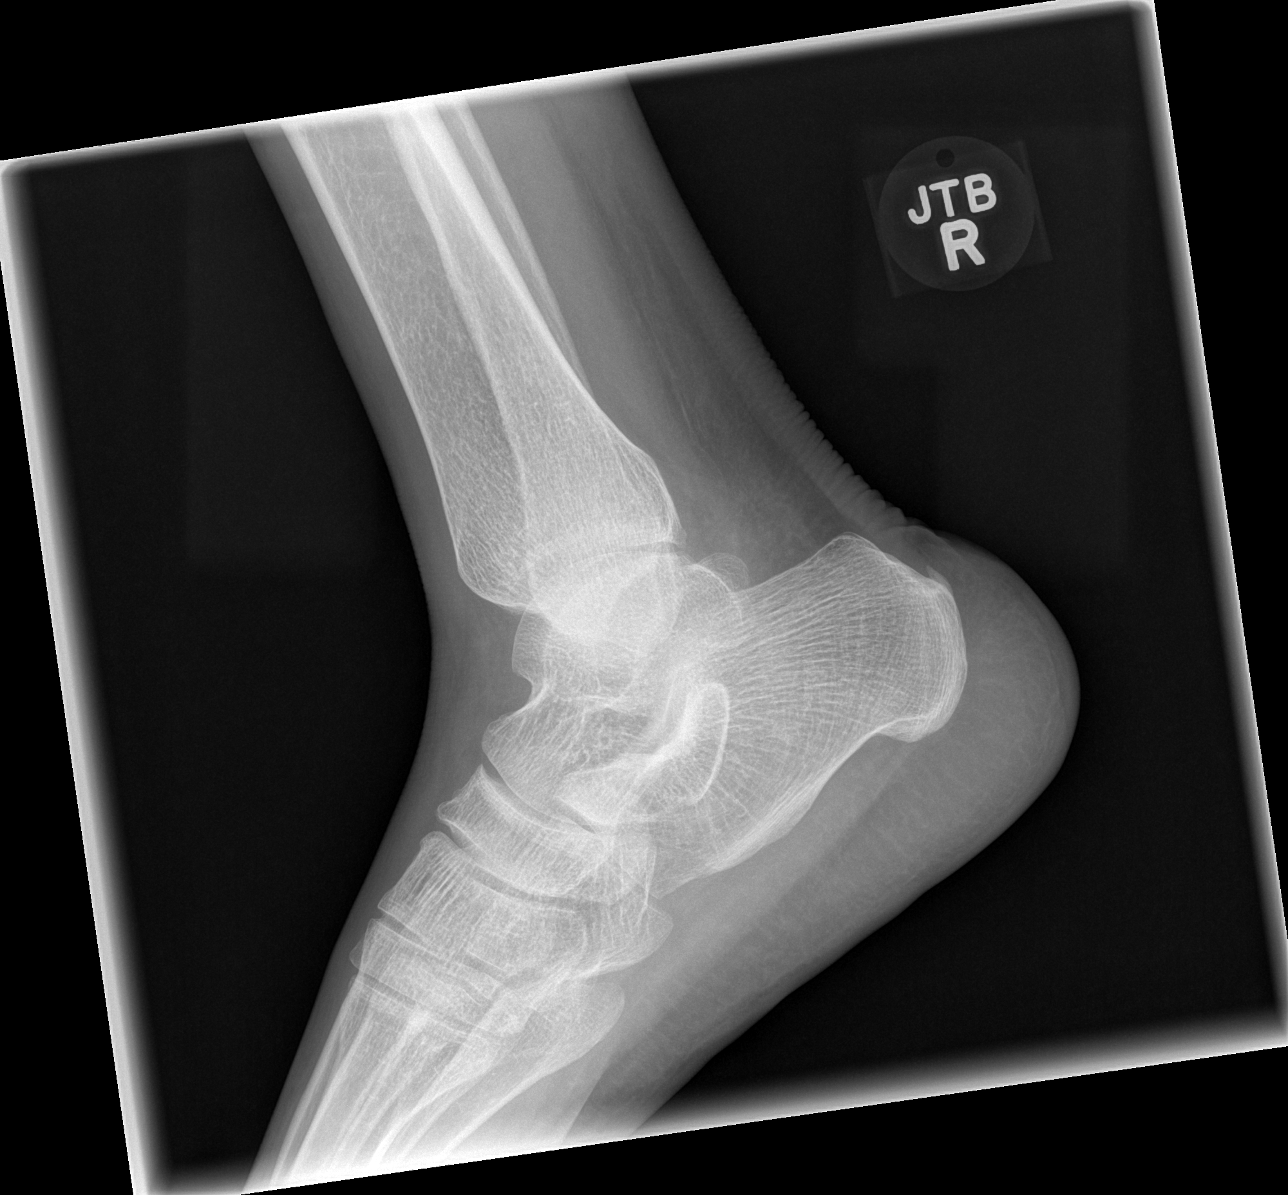

[3 of 3 positions shown; findings below may reference images not displayed]

FINDINGS: There is no evidence of fracture, dislocation, or joint effusion.
There is no evidence of arthropathy or other focal bone abnormality.
Soft tissues are unremarkable.
IMPRESSION: No acute abnormality noted.

## 2016-06-30 NOTE — Telephone Encounter (Signed)
-----   Message from Mena Goes, RN sent at 05/05/2016 10:37 AM EDT ----- Regarding: schedule    ----- Message ----- From: Conrad Fords, MD Sent: 05/05/2016   9:51 AM To: [facility] [address]  [name] [phone] Feb 26, 1971  PROCEDURE: 1.  Left thigh arteriovenous graft cannulation under ultrasound guidance 2.  Left thigh shuntogram 3.  Venoplasty of femoral vein x 2 (6 mm x 80 mm, 10 mm x 40 mm)  Follow-up: 3 months  Orders(s) for follow-up: Left thigh access duplex

## 2016-06-30 NOTE — Telephone Encounter (Signed)
LVM will mail letter as well for [place] on 11/3 and f/u with doc

## 2016-07-14 ENCOUNTER — Other Ambulatory Visit: Payer: Self-pay | Admitting: Radiology

## 2016-07-14 ENCOUNTER — Other Ambulatory Visit (HOSPITAL_COMMUNITY): Payer: Self-pay | Admitting: Nephrology

## 2016-07-14 DIAGNOSIS — T82868A Thrombosis of vascular prosthetic devices, implants and grafts, initial encounter: Secondary | ICD-10-CM

## 2016-07-15 ENCOUNTER — Ambulatory Visit (HOSPITAL_COMMUNITY)
Admission: RE | Admit: 2016-07-15 | Discharge: 2016-07-15 | Disposition: A | Discharge status: Home / Self Care | Payer: Medicare Other | Source: Ambulatory Visit | Attending: Nephrology | Admitting: Nephrology

## 2016-07-15 ENCOUNTER — Other Ambulatory Visit (HOSPITAL_COMMUNITY): Payer: Self-pay | Admitting: Nephrology

## 2016-07-15 ENCOUNTER — Encounter (HOSPITAL_COMMUNITY): Payer: Self-pay

## 2016-07-15 DIAGNOSIS — Z87891 Personal history of nicotine dependence: Secondary | ICD-10-CM | POA: Insufficient documentation

## 2016-07-15 DIAGNOSIS — I120 Hypertensive chronic kidney disease with stage 5 chronic kidney disease or end stage renal disease: Secondary | ICD-10-CM | POA: Insufficient documentation

## 2016-07-15 DIAGNOSIS — T82868A Thrombosis of vascular prosthetic devices, implants and grafts, initial encounter: Secondary | ICD-10-CM

## 2016-07-15 DIAGNOSIS — E079 Disorder of thyroid, unspecified: Secondary | ICD-10-CM | POA: Insufficient documentation

## 2016-07-15 DIAGNOSIS — F329 Major depressive disorder, single episode, unspecified: Secondary | ICD-10-CM | POA: Insufficient documentation

## 2016-07-15 DIAGNOSIS — N186 End stage renal disease: Secondary | ICD-10-CM | POA: Insufficient documentation

## 2016-07-15 DIAGNOSIS — Y832 Surgical operation with anastomosis, bypass or graft as the cause of abnormal reaction of the patient, or of later complication, without mention of misadventure at the time of the procedure: Secondary | ICD-10-CM | POA: Insufficient documentation

## 2016-07-15 DIAGNOSIS — Z992 Dependence on renal dialysis: Secondary | ICD-10-CM | POA: Insufficient documentation

## 2016-07-15 DIAGNOSIS — D631 Anemia in chronic kidney disease: Secondary | ICD-10-CM | POA: Insufficient documentation

## 2016-07-15 HISTORY — PX: IR GENERIC HISTORICAL: IMG1180011

## 2016-07-15 LAB — APTT: aPTT: 34 seconds (ref 24–36)

## 2016-07-15 LAB — CBC
HEMATOCRIT: 35.4 % — AB (ref 39.0–52.0)
HEMOGLOBIN: 11.4 g/dL — AB (ref 13.0–17.0)
MCH: 30.4 pg (ref 26.0–34.0)
MCHC: 32.2 g/dL (ref 30.0–36.0)
MCV: 94.4 fL (ref 78.0–100.0)
Platelets: 250 10*3/uL (ref 150–400)
RBC: 3.75 MIL/uL — ABNORMAL LOW (ref 4.22–5.81)
RDW: 14.6 % (ref 11.5–15.5)
WBC: 9.3 10*3/uL (ref 4.0–10.5)

## 2016-07-15 LAB — PROTIME-INR
INR: 0.98
Prothrombin Time: 13 seconds (ref 11.4–15.2)

## 2016-07-15 LAB — BASIC METABOLIC PANEL
ANION GAP: 14 (ref 5–15)
BUN: 45 mg/dL — ABNORMAL HIGH (ref 6–20)
CO2: 28 mmol/L (ref 22–32)
Calcium: 10.9 mg/dL — ABNORMAL HIGH (ref 8.9–10.3)
Chloride: 95 mmol/L — ABNORMAL LOW (ref 101–111)
Creatinine, Ser: 15.1 mg/dL — ABNORMAL HIGH (ref 0.61–1.24)
GFR calc Af Amer: 4 mL/min — ABNORMAL LOW (ref >60)
GFR, EST NON AFRICAN AMERICAN: 3 mL/min — AB (ref >60)
GLUCOSE: 89 mg/dL (ref 65–99)
POTASSIUM: 4.2 mmol/L (ref 3.5–5.1)
Sodium: 137 mmol/L (ref 135–145)

## 2016-07-15 MED ORDER — SODIUM CHLORIDE 0.9 % IV SOLN: INTRAVENOUS | Status: DC

## 2016-07-15 MED ORDER — FENTANYL CITRATE (PF) 100 MCG/2ML IJ SOLN
INTRAMUSCULAR | Status: AC
  Filled 2016-07-15: qty 2

## 2016-07-15 MED ORDER — MIDAZOLAM HCL 2 MG/2ML IJ SOLN
INTRAMUSCULAR | Status: AC
  Filled 2016-07-15: qty 2

## 2016-07-15 MED ORDER — HEPARIN SODIUM (PORCINE) 1000 UNIT/ML IJ SOLN
INTRAMUSCULAR | Status: AC | PRN
  Administered 2016-07-15: 4000 [IU] via INTRAVENOUS

## 2016-07-15 MED ORDER — FENTANYL CITRATE (PF) 100 MCG/2ML IJ SOLN
INTRAMUSCULAR | Status: AC | PRN
  Administered 2016-07-15 (×2): 50 ug via INTRAVENOUS
  Administered 2016-07-15 (×5): 25 ug via INTRAVENOUS
  Administered 2016-07-15: 50 ug via INTRAVENOUS

## 2016-07-15 MED ORDER — MIDAZOLAM HCL 2 MG/2ML IJ SOLN
INTRAMUSCULAR | Status: AC
  Filled 2016-07-15: qty 4

## 2016-07-15 MED ORDER — IOPAMIDOL (ISOVUE-300) INJECTION 61%
INTRAVENOUS | Status: AC
  Administered 2016-07-15: 50 mL
  Filled 2016-07-15: qty 100

## 2016-07-15 MED ORDER — FLUMAZENIL 0.5 MG/5ML IV SOLN
INTRAVENOUS | Status: AC
  Filled 2016-07-15: qty 5

## 2016-07-15 MED ORDER — HEPARIN SODIUM (PORCINE) 1000 UNIT/ML IJ SOLN
INTRAMUSCULAR | Status: AC
  Filled 2016-07-15: qty 1

## 2016-07-15 MED ORDER — LIDOCAINE-EPINEPHRINE (PF) 1 %-1:200000 IJ SOLN
INTRAMUSCULAR | Status: AC
  Filled 2016-07-15: qty 30

## 2016-07-15 MED ORDER — FENTANYL CITRATE (PF) 100 MCG/2ML IJ SOLN
INTRAMUSCULAR | Status: AC
  Filled 2016-07-15: qty 4

## 2016-07-15 MED ORDER — MIDAZOLAM HCL 2 MG/2ML IJ SOLN
INTRAMUSCULAR | Status: AC | PRN
  Administered 2016-07-15 (×4): 0.5 mg via INTRAVENOUS
  Administered 2016-07-15 (×2): 1 mg via INTRAVENOUS
  Administered 2016-07-15: 0.5 mg via INTRAVENOUS
  Administered 2016-07-15: 1 mg via INTRAVENOUS

## 2016-07-15 MED ORDER — NALOXONE HCL 0.4 MG/ML IJ SOLN
INTRAMUSCULAR | Status: AC
  Filled 2016-07-15: qty 1

## 2016-07-15 MED ORDER — ALTEPLASE 2 MG IJ SOLR
INTRAMUSCULAR | Status: AC
  Filled 2016-07-15: qty 2

## 2016-07-15 MED ORDER — LIDOCAINE HCL 1 % IJ SOLN
INTRAMUSCULAR | Status: AC | PRN
  Administered 2016-07-15: 10 mL

## 2016-07-15 NOTE — Discharge Instructions (Signed)
Fistulogram, Care After Refer to this sheet in the next few weeks. These instructions provide you with information on caring for yourself after your procedure. Your health care provider may also give you more specific instructions. Your treatment has been planned according to current medical practices, but problems sometimes occur. Call your health care provider if you have any problems or questions after your procedure. WHAT TO EXPECT AFTER THE PROCEDURE After your procedure, it is typical to have the following:  A small amount of discomfort in the area where the catheters were placed.  A small amount of bruising around the fistula.  Sleepiness and fatigue. HOME CARE INSTRUCTIONS  Rest at home for the day following your procedure.  Do not drive or operate heavy machinery while taking pain medicine.  Take medicines only as directed by your health care provider.  Do not take baths, swim, or use a hot tub until your health care provider approves. You may shower 24 hours after the procedure or as directed by your health care provider.  There are many different ways to close and cover an incision, including stitches, skin glue, and adhesive strips. Follow your health care provider's instructions on:  Incision care.  Bandage (dressing) changes and removal.  Incision closure removal.  Monitor your dialysis fistula carefully. SEEK MEDICAL CARE IF:  You have drainage, redness, swelling, or pain at your catheter site.  You have a fever.  You have chills. SEEK IMMEDIATE MEDICAL CARE IF:  You feel weak.  You have trouble balancing.  You have trouble moving your arms or legs.  You have problems with your speech or vision.  You can no longer feel a vibration or buzz when you put your fingers over your dialysis fistula.  The limb that was used for the procedure:  Swells.  Is painful.  Is cold.  Is discolored, such as blue or pale white.   This information is not intended  to replace advice given to you by your health care provider. Make sure you discuss any questions you have with your health care provider.   Document Released: 02/03/2014 Document Reviewed: 02/03/2014 Elsevier Interactive Patient Education 2016 [name] from Work, [employer], or Physical Activity    [name] [name] needs to be excused from: __X___ Work _____ [employer] _____ Physical activity beginning now and through the following date:07/16/16 He or she may return to work or school but should still avoid the following physical activity or activities from now until ____________________. Activity restrictions include: __X___ Lifting more than 10 lb _____ Sitting longer than __________ minutes at a time _____ Standing longer than ________ minutes at a time __X___ He or she may return to full physical activity as of 07/16/16 Health Care Provider Name (printed):[name], [place] Provider (signature): ___________________________________________ Date: ________________   This information is not intended to replace advice given to you by your health care provider. Make sure you discuss any questions you have with your health care provider.   Document Released: 03/15/2001 Document Revised: 10/10/2014 Document Reviewed: 04/21/2014 Elsevier Interactive Patient Education [employer].

## 2016-07-15 NOTE — H&P (Signed)
Chief Complaint: clotted left thigh AVG  Referring Physician:Dr. Jamal Maes  Supervising Physician: [name]  Patient Status:  Out-pt  HPI: [name] is an 45 y.o. male who has an extensive history regarding his left thigh graft.  He has had interventions by IR as well as vascular surgery multiple times within the last year.  His most recent by IR was 03-12-16 at which time he had a declot performed.  On 05-05-16 he had an intervention by vascular surgery where he had venoplasty performed of a stenosed area.  He went to HD on Wednesday and had no issues.  He noticed yesterday that his graft no longer had a thrill.  He has been set up today for a fistulagram and possible intervention.  Past Medical History:  Past Medical History:  Diagnosis Date  . Anemia   . Cough    DRY    . Depression   . ESRD on hemodialysis ([place])    HD [facility] MWF  . Headache(784.0)   . Hypertension   . Muscle spasms of neck    BACK, NECK  . Thyroid disease     Past Surgical History:  Past Surgical History:  Procedure Laterality Date  . ARTERIOVENOUS GRAFT PLACEMENT Left    "forearm, it's not working; thigh"   . ARTERIOVENOUS GRAFT PLACEMENT Left 11/09/2015  . FALSE ANEURYSM REPAIR Left 11/09/2015   Procedure: REPAIR OF LEFT FEMORAL PSEUDOANEURYSM; REVISION  OF LEFT THIGH ARTERIOVENOUS GRAFT USING 6MM X 10 CM GORETEX GRAFT ;  Surgeon: [name], MD;  Location: [facility];  Service: Vascular;  Laterality: Left;  . PARATHYROIDECTOMY N/A 04/24/2014   Procedure: TOTAL 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;  Surgeon: [name], MD;  Location: [facility];  Service: General;  Laterality: N/A;  NECK AND LEFT FOREARM  . PERIPHERAL VASCULAR CATHETERIZATION N/A 05/05/2016   Procedure: A/V Shuntogram;  Surgeon: Conrad Pea Ridge, MD;  Location: [facility];  Service: Cardiovascular;  Laterality: N/A;  . PERIPHERAL VASCULAR CATHETERIZATION Left 05/05/2016   Procedure: Peripheral Vascular  Balloon Angioplasty;  Surgeon: Conrad [place] Farm, MD;  Location: [facility];  Service: Cardiovascular;  Laterality: Left;  . PSEUDOANEURYSM REPAIR Left 11/09/2015  . THROMBECTOMY / ARTERIOVENOUS GRAFT REVISION Left 08/18/2015   thigh  . THROMBECTOMY AND REVISION OF ARTERIOVENTOUS (AV) GORETEX  GRAFT Left 08/23/2015   Procedure: THROMBECTOMY AND REVISION OF ARTERIOVENTOUS (AV) GORETEX  GRAFT LEFT THIGH ;  Surgeon: [name], MD;  Location: [facility];  Service: Vascular;  Laterality: Left;  . THROMBECTOMY W/ EMBOLECTOMY Left 08/18/2015   Procedure: THROMBECTOMY  AND REVISION ARTERIOVENOUS GORE-TEX GRAFT/LEFT THIGH;  Surgeon: [name], MD;  Location: [facility];  Service: Vascular;  Laterality: Left;  Marland Kitchen VENOGRAM Left 05/05/2016   Procedure: Venogram;  Surgeon: Conrad Oakdale, MD;  Location: [facility];  Service: Cardiovascular;  Laterality: Left;  lower extremity    Family History: History reviewed. No pertinent family history.  Social History:  reports that he quit smoking about 30 years ago. He has never used smokeless tobacco. He reports that he does not drink alcohol or use drugs.  Allergies:  Allergies  Allergen Reactions  . Lisinopril Cough    Medications: Medications reviewed in epic  Please HPI for pertinent positives, otherwise complete 10 system ROS negative.  Mallampati Score: MD Evaluation Airway: WNL Heart: WNL Abdomen: WNL Chest/ Lungs: WNL ASA  Classification: 3 Mallampati/Airway Score: Two  Physical Exam:  BP (!) 154/111   Pulse 65   Temp 97.9 F (36.6 C) (Oral)   Resp 18   Ht 6' (1.829 m)   Wt 171 lb (77.6 kg)   SpO2 100%   BMI 23.19 kg/m  Body mass index is 23.19 kg/m. General: pleasant, WD, WN black male who is laying in bed in NAD HEENT: head is normocephalic, atraumatic.  Sclera are noninjected.  PERRL.  Ears and nose without any masses or lesions.  Mouth is pink and moist Heart: regular, rate, and rhythm.  Normal s1,s2. No obvious  murmurs, gallops, or rubs noted.  Palpable radial and pedal pulses bilaterally Lungs: CTAB, no wheezes, rhonchi, or rales noted.  Respiratory effort nonlabored Abd: soft, NT, ND, +BS, no masses, hernias, or organomegaly MS: all 4 extremities are symmetrical with no cyanosis, clubbing, or edema.  He has a nonfunctioning AVF in his LUE.  He has an AVG in his left thigh with no thrill. Psych: A&Ox3 with an appropriate affect.   Labs: Results for orders placed or performed during the hospital encounter of 07/15/16 (from the past 48 hour(s))  Basic metabolic panel     Status: Abnormal   Collection Time: 07/15/16  8:03 AM  Result Value Ref Range   Sodium 137 135 - 145 mmol/L   Potassium 4.2 3.5 - 5.1 mmol/L   Chloride 95 (L) 101 - 111 mmol/L   CO2 28 22 - 32 mmol/L   Glucose, Bld 89 65 - 99 mg/dL   BUN 45 (H) 6 - 20 mg/dL   Creatinine, Ser 15.10 (H) 0.61 - 1.24 mg/dL   Calcium 10.9 (H) 8.9 - 10.3 mg/dL   GFR calc non Af Amer 3 (L) >60 mL/min   GFR calc Af Amer 4 (L) >60 mL/min    Comment: (NOTE) The eGFR has been calculated using the CKD EPI equation. This calculation has not been validated in all clinical situations. eGFR's persistently <60 mL/min signify possible Chronic Kidney Disease.    Anion gap 14 5 - 15  CBC     Status: Abnormal   Collection Time: 07/15/16  8:03 AM  Result Value Ref Range   WBC 9.3 4.0 - 10.5 K/uL   RBC 3.75 (L) 4.22 - 5.81 MIL/uL   Hemoglobin 11.4 (L) 13.0 - 17.0 g/dL   HCT 35.4 (L) 39.0 - 52.0 %   MCV 94.4 78.0 - 100.0 fL   MCH 30.4 26.0 - 34.0 pg   MCHC 32.2 30.0 - 36.0 g/dL   RDW 14.6 11.5 - 15.5 %   Platelets 250 150 - 400 K/uL    Imaging: No results found.  Assessment/Plan 1. Clotted AVG in left upper thigh -we will plan today to proceed with a fistulagram, thrombectomy/lysis with possible angioplasty/stenting and possible placement of a perm cath if unable to resolve the issue in his graft.   -the patient did not initially have a ride home;  however, after multiple calls, I myself, confirmed a ride home for the patient who is available to pick him up from [facility] after his procedure. -Risks and Benefits discussed with the patient including, but not limited to bleeding, infection, vascular injury, pulmonary embolism, need for tunneled HD catheter placement or even death. All of the patient's questions were answered, patient is agreeable to proceed. Consent signed and in chart.  Thank you for this interesting consult.  I greatly enjoyed meeting [name] and look forward to participating in their care.  A copy of this report was  sent to the requesting provider on this date.  Electronically Signed: Henreitta Cea 07/15/2016, 8:43 AM   I spent a total of    40 Minutes in face to face in clinical consultation, greater than 50% of which was counseling/coordinating care for clotted AVG.

## 2016-07-15 NOTE — Procedures (Signed)
Technically successful declot of left thigh dialysis graft.   EBL: Minimal No immediate post procedural complications. Access is ready for immediate use.  Ronny Bacon, MD Pager #: [phone]

## 2016-07-15 NOTE — Progress Notes (Signed)
Assumed care of pt from D. Hedge, RN. Assessment documented.

## 2016-08-03 ENCOUNTER — Encounter: Payer: Self-pay | Admitting: Vascular Surgery

## 2016-08-04 NOTE — Progress Notes (Deleted)
    Established Dialysis Access  History of Present Illness  [name] is a 45 y.o. (04/15/1971) male who presents for re-evaluation L thigh AVG.  The patient's complication from previous access procedures include: outflow stenosis.  Patient procedures include:  L femoral vein venoplasty x 2 (05/05/16).  Flow rates in the thigh AVG have been: ***.  Pthas had *** bleeding complications from the L thigh AVG.  The patient's PMH, PSH, SH, and FamHx are unchanged from 05/05/16.  Current Outpatient Prescriptions  Medication Sig Dispense Refill  . acetaminophen (TYLENOL) 500 MG tablet Take 1,000 mg by mouth every 6 (six) hours as needed for pain. For pain    . b complex-vitamin c-folic acid (NEPHRO-VITE) 0.8 MG TABS tablet Take 1 tablet by mouth daily.    . calcium carbonate (TUMS - DOSED IN MG ELEMENTAL CALCIUM) 500 MG chewable tablet Chew 4 tablets by mouth 3 (three) times daily with meals.     No current facility-administered medications for this visit.    On ROS today: ***, ***   Physical Examination  There were no vitals filed for this visit. There is no height or weight on file to calculate BMI.  General: A&O x 3, WD, *** ,Cachectic / Ill appear / Somulent  Pulmonary: Sym exp, good air movt, CTAB, no rales, rhonchi, & wheezing***, + rales / + rhonchi / + wheezing  Cardiac: RRR, Nl S1, S2, no Murmurs, rubs or gallops*** ,S3/S4, Irregularly, irregular rhythm and rate  Vascular: Vessel Right Left  Radial ***Palpable ***Palpable  Ulnar ***Palpable ***Palpable  Brachial ***Palpable ***Palpable   Gastrointestinal: soft, NTND, no G/R, bo HSM, no masses, no CVAT B***, + AAA / Surg. Inc TTP: (RUQ / RLQ / LUQ / LLQ / Epigastric), +G / +R  Musculoskeletal: M/S 5/5 throughout *** except ***, Extremities without  ischemic changes *** except  ***, *** palpable thrill in access in ***, *** bruit in access  Neurologic: Pain and light touch intact in extremities *** except ***,  Motor exam as listed above  Non-Invasive Vascular Imaging  {Side:17767} arm Access Duplex  (Date: 08/04/2016):   Diameters:  *** cm  Depth:  *** cm  PSV:  *** c/s  B arterial doppler (08/04/2016 )  R:  Brachial: *** mm, ***,  Radial: *** mm, ***,  Ulnar: *** mm, ***,  L:  Brachial: *** mm, ***,  Radial: *** mm, ***,  Ulnar: *** mm, ***,  B Vein Mapping  (Date: 08/04/2016):   R arm: acceptable vein conduits include ***  L arm: acceptable vein conduits include ***   Outside Studies/Documentation *** pages of outside documents were reviewed including: ***.   Medical Decision Making  [name] is a 45 y.o. male who presents with ESRD requiring hemodialysis, prior outflow stenosis in L thigh AVG.   ***  The patient has *** agreed to proceed with the above procedure which will be scheduled ***.   Adele Barthel, MD, FACS [facility] Office: [phone] Pager: [phone]

## 2016-08-05 ENCOUNTER — Encounter (HOSPITAL_COMMUNITY): Payer: Medicare Other

## 2016-08-05 ENCOUNTER — Ambulatory Visit: Payer: Medicare Other | Admitting: Vascular Surgery

## 2016-08-31 ENCOUNTER — Emergency Department (INDEPENDENT_AMBULATORY_CARE_PROVIDER_SITE_OTHER)
Admission: EM | Admit: 2016-08-31 | Discharge: 2016-08-31 | Disposition: A | Discharge status: Home / Self Care | Payer: Self-pay | Source: Home / Self Care | Attending: Family Medicine | Admitting: Family Medicine

## 2016-08-31 ENCOUNTER — Encounter: Payer: Self-pay | Admitting: *Deleted

## 2016-08-31 DIAGNOSIS — I10 Essential (primary) hypertension: Secondary | ICD-10-CM

## 2016-08-31 DIAGNOSIS — N186 End stage renal disease: Secondary | ICD-10-CM

## 2016-08-31 MED ORDER — CLONIDINE HCL 0.1 MG PO TABS: ORAL_TABLET | ORAL | 1 refills | Status: DC

## 2016-08-31 NOTE — ED Triage Notes (Signed)
Pt c/o HA and elevated BP x Sunday night. He reports is BP was 191/? At Dialysis on Monday. They started him on Amlodipine 10 mg. He does dialysis Monday, Wednesday and Friday. He reports that his doctor stopped his amlodipine and clonidine 6 mths ago because his BP was too low.

## 2016-08-31 NOTE — Discharge Instructions (Signed)
Continue Amlodipine as prescribed.  Begin Clonidine 0.1mg  daily.  Monitor blood pressure more frequently at different times of day and record on a calendar. Take record to family doctor on next visit.

## 2016-08-31 NOTE — ED Provider Notes (Signed)
[name]    CSN: [id number] Arrival date & time: 08/31/16  0831     History   Chief Complaint Chief Complaint  Patient presents with  . Hypertension  . Headache    HPI [name] is a 45 y.o. male.   Patient has a history of ESRD on dialysis (receives dialysis on Monday, Wednesday, and Friday).  He had elevated BP and headache three days ago, and his BP the next day at dialysis was approximately 190/100.  At that time he was started on Amlodipine 10mg  daily.  He complains of persistent dull headache.  No chest pain. He had been taking both Amlodipine and Clonidine six months ago but both were discontinued because of low blood pressure.   The history is provided by the patient.    Past Medical History:  Diagnosis Date  . Anemia   . Cough    DRY    . Depression   . ESRD on hemodialysis ([place])    HD [facility] MWF  . Headache(784.0)   . Hypertension   . Muscle spasms of neck    BACK, NECK  . Thyroid disease     Patient Active Problem List   Diagnosis Date Noted  . 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 Cellulitis of left thigh 08/26/2015  . Abdominal pain 08/26/2015  . Complication from renal dialysis device   . ESRD (end stage renal disease) on dialysis ([place])   . ESRD (end stage renal disease) ([place])   . Sepsis ([place]) 12/14/2014  . Hypotension 12/14/2014  . Diarrhea   . Hypocalcemia   . Secondary hyperparathyroidism of renal origin ([place]) 04/24/2014  . Hyperparathyroidism, secondary ([place]) 03/21/2014    Past Surgical History:  Procedure Laterality Date  . ARTERIOVENOUS GRAFT PLACEMENT Left    "forearm, it's not working; thigh"   . ARTERIOVENOUS GRAFT PLACEMENT Left 11/09/2015  . FALSE ANEURYSM REPAIR Left 11/09/2015   Procedure: REPAIR OF LEFT FEMORAL PSEUDOANEURYSM; REVISION  OF LEFT THIGH ARTERIOVENOUS GRAFT USING 6MM X 10 CM GORETEX GRAFT ;  Surgeon: [name], MD;  Location: [facility];  Service: Vascular;   Laterality: Left;  . IR GENERIC HISTORICAL  07/15/2016   IR US GUIDE VASC ACCESS LEFT 07/15/2016 [name], MD [facility]  . IR GENERIC HISTORICAL Left 07/15/2016   IR THROMBECTOMY AV FISTULA W/THROMBOLYSIS/PTA INC/SHUNT/IMG LEFT 07/15/2016 [name], MD [facility]  . PARATHYROIDECTOMY N/A 04/24/2014   Procedure: TOTAL 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;  Surgeon: [name], MD;  Location: [facility];  Service: General;  Laterality: N/A;  NECK AND LEFT FOREARM  . PERIPHERAL VASCULAR CATHETERIZATION N/A 05/05/2016   Procedure: A/V Shuntogram;  Surgeon: Conrad [place], MD;  Location: [facility];  Service: Cardiovascular;  Laterality: N/A;  . PERIPHERAL VASCULAR CATHETERIZATION Left 05/05/2016   Procedure: Peripheral Vascular Balloon Angioplasty;  Surgeon: Conrad Helena, MD;  Location: [facility];  Service: Cardiovascular;  Laterality: Left;  . PSEUDOANEURYSM REPAIR Left 11/09/2015  . THROMBECTOMY / ARTERIOVENOUS GRAFT REVISION Left 08/18/2015   thigh  . THROMBECTOMY AND REVISION OF ARTERIOVENTOUS (AV) GORETEX  GRAFT Left 08/23/2015   Procedure: THROMBECTOMY AND REVISION OF ARTERIOVENTOUS (AV) GORETEX  GRAFT LEFT THIGH ;  Surgeon: [name], MD;  Location: [facility];  Service: Vascular;  Laterality: Left;  . THROMBECTOMY W/ EMBOLECTOMY Left 08/18/2015   Procedure: THROMBECTOMY  AND REVISION ARTERIOVENOUS GORE-TEX GRAFT/LEFT THIGH;  Surgeon: [name], MD;  Location:  [facility];  Service: Vascular;  Laterality: Left;  Marland Kitchen VENOGRAM Left 05/05/2016   Procedure: Venogram;  Surgeon: Conrad Sanborn, MD;  Location: [facility];  Service: Cardiovascular;  Laterality: Left;  lower extremity       Home Medications    Prior to Admission medications   Medication Sig Start Date End Date Taking? Authorizing Provider  amLODipine (NORVASC) 10 MG tablet Take 10 mg by mouth daily.   Yes Historical Provider, MD  b complex-vitamin c-folic acid (NEPHRO-VITE) 0.8 MG TABS  tablet Take 1 tablet by mouth daily.   Yes Historical Provider, MD  acetaminophen (TYLENOL) 500 MG tablet Take 1,000 mg by mouth every 6 (six) hours as needed for pain. For pain    Historical Provider, MD  calcium carbonate (TUMS - DOSED IN MG ELEMENTAL CALCIUM) 500 MG chewable tablet Chew 4 tablets by mouth 3 (three) times daily with meals.    Historical Provider, MD  cloNIDine (CATAPRES) 0.1 MG tablet Take one tab by mouth daily for Blood pressure 08/31/16   Kandra Nicolas, MD    Family History History reviewed. No pertinent family history.  Social History Social History  Substance Use Topics  . Smoking status: Former Smoker    Quit date: 03/21/1986  . Smokeless tobacco: Never Used  . Alcohol use No     Allergies   Lisinopril   Review of Systems Review of Systems  Constitutional: Negative for activity change, appetite change, chills and diaphoresis.  HENT: Negative.   Eyes: Negative.   Respiratory: Negative for chest tightness, shortness of breath and wheezing.   Cardiovascular: Negative for chest pain and palpitations.  Gastrointestinal: Negative.   Genitourinary: Negative.   Musculoskeletal: Negative.   Neurological: Positive for headaches. Negative for dizziness, tremors, seizures, syncope, facial asymmetry, speech difficulty, weakness, light-headedness and numbness.  Hematological: Negative.      Physical Exam Triage Vital Signs ED Triage Vitals  Enc Vitals Group     BP 08/31/16 0922 (!) 177/109     Pulse Rate 08/31/16 0922 70     Resp 08/31/16 0922 16     Temp 08/31/16 0922 97.7 F (36.5 C)     Temp Source 08/31/16 0922 Oral     SpO2 08/31/16 0922 100 %     Weight 08/31/16 0922 181 lb (82.1 kg)     Height --      Head Circumference --      Peak Flow --      Pain Score 08/31/16 0924 0     Pain Loc --      Pain Edu? --      Excl. in [place]? --    No data found.   Updated Vital Signs BP (!) 177/109 (BP Location: Left Arm)   Pulse 70   Temp 97.7 F (36.5  C) (Oral)   Resp 16   Wt 181 lb (82.1 kg)   SpO2 100%   BMI 24.55 kg/m   Visual Acuity Right Eye Distance:   Left Eye Distance:   Bilateral Distance:    Right Eye Near:   Left Eye Near:    Bilateral Near:     Physical Exam Nursing notes and Vital Signs reviewed. Appearance:  Patient appears stated age, and in no acute distress Eyes:  Pupils are equal, round, and reactive to light and accomodation.  Extraocular movement is intact.  Conjunctivae are not inflamed  Ears:  Normal externally.  Nose:  Normal Pharynx:  Normal Neck:  Supple.  No adenopathy. Lungs:  Clear to auscultation.  Breath sounds are equal.  Moving air well. Heart:  Regular rate and rhythm without murmurs, rubs, or gallops.  Abdomen:  Nontender without masses or hepatosplenomegaly.  Bowel sounds are present.  No CVA or flank tenderness.  Extremities:  No edema.  Skin:  No rash present.   Neurologic:  Cranial nerves 2 through 12 are normal.  Patellar, achilles, and elbow reflexes are normal.  Cerebellar function is intact (finger-to-nose and rapid alternating hand movement).  Gait and station are normal.    UC Treatments / Results  Labs (all labs ordered are listed, but only abnormal results are displayed) Labs Reviewed - No data to display  EKG  EKG Interpretation None       Radiology No results found.  Procedures Procedures (including critical care time)  Medications Ordered in UC Medications - No data to display   Initial Impression / Assessment and Plan / UC Course  I have reviewed the triage vital signs and the nursing notes.  Pertinent labs & imaging results that were available during my care of the patient were reviewed by me and considered in my medical decision making (see chart for details).  Clinical Course     Continue Amlodipine as prescribed.  Begin Clonidine 0.1mg  daily.  Monitor blood pressure more frequently at different times of day and record on a calendar. Take record to  family doctor on next visit. Followup with Family Doctor in about 5 days.    Final Clinical Impressions(s) / UC Diagnoses   Final diagnoses:  Hypertension, unspecified type  End stage renal disease (HCC)    New Prescriptions New Prescriptions   CLONIDINE (CATAPRES) 0.1 MG TABLET    Take one tab by mouth daily for Blood pressure     Kandra Nicolas, MD 09/08/16 [phone]

## 2016-09-02 ENCOUNTER — Telehealth: Payer: Self-pay | Admitting: *Deleted

## 2016-09-02 NOTE — Telephone Encounter (Signed)
Callback: No answer, LMOM f/u from visit, call back as needed. Continue to monitor BP frequently.

## 2016-10-04 ENCOUNTER — Other Ambulatory Visit (HOSPITAL_COMMUNITY): Payer: Self-pay | Admitting: Nephrology

## 2016-10-04 ENCOUNTER — Other Ambulatory Visit: Payer: Self-pay | Admitting: Radiology

## 2016-10-04 DIAGNOSIS — Z992 Dependence on renal dialysis: Principal | ICD-10-CM

## 2016-10-04 DIAGNOSIS — N186 End stage renal disease: Secondary | ICD-10-CM

## 2016-10-05 ENCOUNTER — Other Ambulatory Visit (HOSPITAL_COMMUNITY): Payer: Self-pay | Admitting: Nephrology

## 2016-10-05 ENCOUNTER — Ambulatory Visit (HOSPITAL_COMMUNITY)
Admission: RE | Admit: 2016-10-05 | Discharge: 2016-10-05 | Disposition: A | Discharge status: Home / Self Care | Payer: Medicare Other | Source: Ambulatory Visit | Attending: Nephrology | Admitting: Nephrology

## 2016-10-05 ENCOUNTER — Encounter (HOSPITAL_COMMUNITY): Payer: Self-pay

## 2016-10-05 DIAGNOSIS — N186 End stage renal disease: Secondary | ICD-10-CM

## 2016-10-05 DIAGNOSIS — Z992 Dependence on renal dialysis: Secondary | ICD-10-CM | POA: Insufficient documentation

## 2016-10-05 DIAGNOSIS — E079 Disorder of thyroid, unspecified: Secondary | ICD-10-CM | POA: Insufficient documentation

## 2016-10-05 DIAGNOSIS — Y832 Surgical operation with anastomosis, bypass or graft as the cause of abnormal reaction of the patient, or of later complication, without mention of misadventure at the time of the procedure: Secondary | ICD-10-CM | POA: Insufficient documentation

## 2016-10-05 DIAGNOSIS — F329 Major depressive disorder, single episode, unspecified: Secondary | ICD-10-CM | POA: Insufficient documentation

## 2016-10-05 DIAGNOSIS — I120 Hypertensive chronic kidney disease with stage 5 chronic kidney disease or end stage renal disease: Secondary | ICD-10-CM | POA: Insufficient documentation

## 2016-10-05 DIAGNOSIS — D631 Anemia in chronic kidney disease: Secondary | ICD-10-CM | POA: Insufficient documentation

## 2016-10-05 DIAGNOSIS — Z87891 Personal history of nicotine dependence: Secondary | ICD-10-CM | POA: Insufficient documentation

## 2016-10-05 DIAGNOSIS — T82858A Stenosis of vascular prosthetic devices, implants and grafts, initial encounter: Secondary | ICD-10-CM | POA: Insufficient documentation

## 2016-10-05 DIAGNOSIS — T82868A Thrombosis of vascular prosthetic devices, implants and grafts, initial encounter: Secondary | ICD-10-CM | POA: Insufficient documentation

## 2016-10-05 HISTORY — PX: IR GENERIC HISTORICAL: IMG1180011

## 2016-10-05 MED ORDER — LIDOCAINE HCL (PF) 1 % IJ SOLN
INTRAMUSCULAR | Status: DC | PRN
  Administered 2016-10-05: 5 mL

## 2016-10-05 MED ORDER — HEPARIN SODIUM (PORCINE) 1000 UNIT/ML IJ SOLN
INTRAMUSCULAR | Status: AC
  Filled 2016-10-05: qty 1

## 2016-10-05 MED ORDER — SODIUM CHLORIDE 0.9 % IV SOLN: INTRAVENOUS | Status: DC

## 2016-10-05 MED ORDER — LIDOCAINE HCL (PF) 1 % IJ SOLN
INTRAMUSCULAR | Status: AC
  Filled 2016-10-05: qty 30

## 2016-10-05 MED ORDER — ALTEPLASE 2 MG IJ SOLR
INTRAMUSCULAR | Status: AC
  Administered 2016-10-05: 2 mg
  Filled 2016-10-05: qty 2

## 2016-10-05 MED ORDER — IOPAMIDOL (ISOVUE-300) INJECTION 61%
INTRAVENOUS | Status: AC
  Administered 2016-10-05: 50 mL
  Filled 2016-10-05: qty 100

## 2016-10-05 MED ORDER — HEPARIN SODIUM (PORCINE) 1000 UNIT/ML IJ SOLN
3000.0000 [IU] | Freq: Once | INTRAMUSCULAR | Status: AC
  Administered 2016-10-05: 3000 [IU] via INTRA_ARTERIAL

## 2016-10-05 NOTE — H&P (Signed)
Chief Complaint: Thrombosed Dialysis graft (Left thigh)  Referring Physician(s): Dunham,Cynthia  Supervising Physician: Daryll Brod  Patient Status: [facility] - Out-pt  History of Present Illness: [name] is a 46 y.o. male who has an extensive history regarding his left thigh graft.    He has had interventions by IR as well as vascular surgery multiple times within the last year.    His most recent by IR was a thrombectomy by Dr. Pascal Lux on 07/15/2016.  On 05-05-16 he had an intervention by vascular surgery where he had venoplasty performed of a stenosed area.    He went for his usual hemodialysis yesterday and his graft was again found to be clotted.  He is NPO. He does not take blood thinners.  Past Medical History:  Diagnosis Date  . Anemia   . Cough    DRY    . Depression   . ESRD on hemodialysis ([place])    HD [place] MWF  . Headache(784.0)   . Hypertension   . Muscle spasms of neck    BACK, NECK  . Thyroid disease     Past Surgical History:  Procedure Laterality Date  . ARTERIOVENOUS GRAFT PLACEMENT Left    "forearm, it's not working; thigh"   . ARTERIOVENOUS GRAFT PLACEMENT Left 11/09/2015  . FALSE ANEURYSM REPAIR Left 11/09/2015   Procedure: REPAIR OF LEFT FEMORAL PSEUDOANEURYSM; REVISION  OF LEFT THIGH ARTERIOVENOUS GRAFT USING 6MM X 10 CM GORETEX GRAFT ;  Surgeon: [name], MD;  Location: [place];  Service: Vascular;  Laterality: Left;  . IR GENERIC HISTORICAL  07/15/2016   IR US GUIDE VASC ACCESS LEFT 07/15/2016 [name], MD [facility]-INTERV RAD  . IR GENERIC HISTORICAL Left 07/15/2016   IR THROMBECTOMY AV FISTULA W/THROMBOLYSIS/PTA INC/SHUNT/IMG LEFT 07/15/2016 [name], MD [facility]INTER[facility]  . PARATHYROIDECTOMY N/A 04/24/2014   Procedure: TOTAL 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;  Surgeon: [name], MD;  Location: [place];  Service: General;  Laterality: N/A;  NECK AND LEFT FOREARM  . PERIPHERAL VASCULAR CATHETERIZATION N/A  05/05/2016   Procedure: A/V Shuntogram;  Surgeon: Conrad Avalon, MD;  Location: [place] CV LAB;  Service: Cardiovascular;  Laterality: N/A;  . PERIPHERAL VASCULAR CATHETERIZATION Left 05/05/2016   Procedure: Peripheral Vascular Balloon Angioplasty;  Surgeon: Conrad Grasonville, MD;  Location: [place] CV LAB;  Service: Cardiovascular;  Laterality: Left;  . PSEUDOANEURYSM REPAIR Left 11/09/2015  . THROMBECTOMY / ARTERIOVENOUS GRAFT REVISION Left 08/18/2015   thigh  . THROMBECTOMY AND REVISION OF ARTERIOVENTOUS (AV) GORETEX  GRAFT Left 08/23/2015   Procedure: THROMBECTOMY AND REVISION OF ARTERIOVENTOUS (AV) GORETEX  GRAFT LEFT THIGH ;  Surgeon: [name], MD;  Location: [place];  Service: Vascular;  Laterality: Left;  . THROMBECTOMY W/ EMBOLECTOMY Left 08/18/2015   Procedure: THROMBECTOMY  AND REVISION ARTERIOVENOUS GORE-TEX GRAFT/LEFT THIGH;  Surgeon: [name], MD;  Location: [place];  Service: Vascular;  Laterality: Left;  Marland Kitchen VENOGRAM Left 05/05/2016   Procedure: Venogram;  Surgeon: Conrad Pasadena, MD;  Location: [place] CV LAB;  Service: Cardiovascular;  Laterality: Left;  lower extremity    Allergies: Lisinopril  Medications: Prior to Admission medications   Medication Sig Start Date End Date Taking? Authorizing Provider  acetaminophen (TYLENOL) 500 MG tablet Take 1,000 mg by mouth every 6 (six) hours as needed for pain. For pain    Historical Provider, MD  amLODipine (NORVASC) 10 MG tablet Take 10 mg by mouth daily.  Historical Provider, MD  b complex-vitamin c-folic acid (NEPHRO-VITE) 0.8 MG TABS tablet Take 1 tablet by mouth daily.    Historical Provider, MD  calcium carbonate (TUMS - DOSED IN MG ELEMENTAL CALCIUM) 500 MG chewable tablet Chew 4 tablets by mouth 3 (three) times daily with meals.    Historical Provider, MD  cloNIDine (CATAPRES) 0.1 MG tablet Take one tab by mouth daily for Blood pressure 08/31/16   Kandra Nicolas, MD     History reviewed. No pertinent  family history.  Social History   Social History  . Marital status: Married    Spouse name: N/A  . Number of children: N/A  . Years of education: N/A   Social History Main Topics  . Smoking status: Former Smoker    Quit date: 03/21/1986  . Smokeless tobacco: Never Used  . Alcohol use No  . Drug use: No  . Sexual activity: Not Asked   Other Topics Concern  . None   Social History Narrative  . None     Review of Systems: A 12 point ROS discussed  Review of Systems  Constitutional: Negative.   HENT: Negative.   Respiratory: Negative.   Cardiovascular: Negative.   Gastrointestinal: Negative.   Genitourinary: Negative.   Musculoskeletal: Negative.   Skin: Negative.   Neurological: Negative.   Hematological: Negative.   Psychiatric/Behavioral: Negative.     Vital Signs: There were no vitals taken for this visit.  Physical Exam  Constitutional: He is oriented to person, place, and time. He appears well-developed and well-nourished.  HENT:  Head: Normocephalic and atraumatic.  Eyes: EOM are normal.  Neck: Normal range of motion.  Cardiovascular: Normal rate, regular rhythm and normal heart sounds.   Pulmonary/Chest: Effort normal and breath sounds normal. No respiratory distress. He has no wheezes.  Abdominal: Soft. He exhibits no distension. There is no tenderness.  Musculoskeletal: Normal range of motion.  Neurological: He is alert and oriented to person, place, and time.  Skin: Skin is warm.  Psychiatric: He has a normal mood and affect. His behavior is normal. Judgment and thought content normal.    Mallampati Score:  MD Evaluation Airway: WNL Heart: WNL Abdomen: WNL Chest/ Lungs: WNL ASA  Classification: 3 Mallampati/Airway Score: Two  Imaging: No results found.  Labs:  CBC:  Recent Labs  11/10/15 0055 03/12/16 1049 05/05/16 0835 06/01/16 0956 07/15/16 0803  WBC 5.9  --   --  5.9 9.3  HGB 8.9* 8.2* 12.9* 12.0* 11.4*  HCT 27.2* 24.0*  38.0* 38.0* 35.4*  PLT 217  --   --  266 250    COAGS:  Recent Labs  07/15/16 0803  INR 0.98  APTT 34    BMP:  Recent Labs  11/10/15 0055 03/12/16 1049 05/05/16 0835 06/01/16 0956 07/15/16 0803  NA 137 150* 137 141 137  K 6.0* 3.3* 4.9 4.3 4.2  CL 95* 108 102 98* 95*  CO2 23  --   --  26 28  GLUCOSE 114* 64* 82 84 89  BUN 80* 60* 88* 61* 45*  CALCIUM 7.7*  --   --  9.2 10.9*  CREATININE 22.04* 14.60* >18.00* 16.49* 15.10*  GFRNONAA 2*  --   --  3* 3*  GFRAA 2*  --   --  4* 4*    LIVER FUNCTION TESTS:  Recent Labs  06/01/16 0956  BILITOT 0.4  AST 15  ALT 13*  ALKPHOS 66  PROT 8.2*  ALBUMIN 3.8    TUMOR  MARKERS: No results for input(s): AFPTM, CEA, CA199, CHROMGRNA in the last 8760 hours.  Assessment and Plan:   End Stage Renal Failure on Hemodialysis  Thrombosed left leg AV graft.  Will proceed with Thrombectomy, possible angioplasty today by Dr. Annamaria Boots.  Risks and Benefits of thrombectomy were discussed with the patient including, but not limited to bleeding, infection, vascular injury, pulmonary embolism, need for tunneled HD catheter placement or even death.  Risks and Benefits of tunneled catheter placement were discussed with the patient including, but not limited to bleeding, infection, vascular injury, pneumothorax which may require chest tube placement, air embolism or even death.  All of the patient's questions were answered, patient is agreeable to proceed. Consent signed and in chart.  Thank you for this interesting consult.  I greatly enjoyed meeting [name] and look forward to participating in their care.  A copy of this report was sent to the requesting provider on this date.  Electronically Signed: Murrell Redden PA-C 10/05/2016, 9:38 AM   I spent a total of  25 Minutes in face to face in clinical consultation, greater than 50% of which was counseling/coordinating care for dialysis graft declot.

## 2016-10-05 NOTE — Discharge Instructions (Signed)
End-Stage Kidney Disease °End-stage kidney disease occurs when the kidneys are so damaged that they cannot do their job. The kidneys are two organs that do many important jobs in the body, which include: °· Removing wastes and extra fluids from the blood. °· Making hormones that maintain the amount of fluid in your tissues and blood vessels. °· Maintaining the right amount of fluids and chemicals in the body. ° °When the kidneys are damaged and cannot do their job, life-threatening problems occur. Without the help of the kidneys, toxins build up in the blood. In end-stage kidney disease, the kidneys cannot get better. °What are the causes? °End-stage kidney disease usually occurs when a long-lasting (chronic) kidney disease gets worse. It may also occur after the kidneys are suddenly damaged (acute kidney injury). °What increases the risk? °This condition is more likely to develop in people who are: °· Older than age 60. °· Male. °· Of African-American descent. °· Current smokers or former smokers. °· Obese. ° °You may also have an increased risk for end-stage kidney disease if you: °· Have a family history of chronic kidney disease (CKD). °· Have had kidney disease for many years. °· Have other longstanding medical conditions that affect the kidneys, such as: °? Cardiovascular disease including high blood pressure. °? Diabetes. °? Certain diseases that affect the immune system. ° °What are the signs or symptoms? °· Swelling (edema) of the face, legs, ankles, or feet. °· Numbness, tingling, or loss of feeling (sensation) in your hands or feet. °· Tiredness (lethargy). °· Nausea or vomiting. °· Confusion, trouble concentrating, or loss of consciousness. °· Chest pain. °· Shortness of breath. °· Little to no urine production. °· Muscle twitches and cramps, especially in the legs. °· Constant itchiness. °· Loss of appetite. °· Pale skin and tissue lining your eyelids (conjunctiva). °· Headaches. °· Abnormally dark or  light skin. °· Decrease in muscle size (muscle wasting). °· Easy bruising. °· Frequent hiccups. °· Stopping of menstruation in women. °· Seizures. °How is this diagnosed? °Your health care provider will measure your blood pressure and do some tests. These may include: °· Urine tests. °· Blood tests. °· Imaging tests. °· A test in which a sample of tissue is removed from the kidneys to be looked at under a microscope (kidney biopsy). ° °How is this treated? °There are two treatments for end-stage kidney disease: °· A procedure that removes toxic wastes from the body (dialysis). Depending on the type of dialysis you choose, it may be performed more than one time a day (peritoneal dialysis) or several times a week (hemodialysis). °· Surgery to receive a new kidney (kidney transplant). ° °In addition to having dialysis or a kidney transplant, you may need to take medicines: °· To control high blood pressure (hypertension). °· To control cholesterol. °· To maintain healthy electrolyte levels in your blood. ° °You may also be given a specific diet to follow that includes requirements or limits for: °· Salt (sodium). °· Protein. °· Phosphorous. °· Potassium. °· Calcium. ° °Follow these instructions at home: °· Follow your prescribed diet. °· Take over-the-counter and prescription medicines only as told by your health care provider. °? Do not take any new medicines unless approved by your health care provider. Many medicines can worsen your kidney damage. °? Do not take any vitamin and mineral supplements unless approved by your health care provider. Many nutritional supplements can worsen your kidney damage. °? The dose of some medicines that you take may need to be   adjusted. °· Do not use any tobacco products, such as cigarettes, chewing tobacco, and e-cigarettes. If you need help quitting, ask your health care provider. °· Keep all follow-up visits as told by your health care provider. This is important. °· Keep track of  your blood pressure. Report changes in your blood pressure as told by your health care provider. °· Achieve and maintain a healthy weight. If you need help with this, ask your health care provider. °· Start or continue an exercise plan. Try to exercise at least 30 minutes a day, 5 days a week. °· Stay current with immunizations as told by your health care provider. °Where to find more information: °· American Association of Kidney Patients: www.aakp.org °· National Kidney Foundation: www.kidney.org °· [employer]: www.akfinc.org °· [employer]: www.lifeoptions.org and www.kidneyschool.org °Contact a health care provider if: °· Your symptoms get worse. °· You develop new symptoms. °Get help right away if: °· You have weakness in an arm or leg on one side of your body. °· You have difficulty speaking or you are slurring your speech. °· You have a sudden change in your vision. °· You have a sudden, severe headache. °· You have a sudden weight increase. °· You have difficulty breathing. °· Your symptoms suddenly get worse. °This information is not intended to replace advice given to you by your health care provider. Make sure you discuss any questions you have with your health care provider. °Document Released: 12/10/2003 Document Revised: 02/25/2016 Document Reviewed: 05/18/2012 °Elsevier Interactive Patient Education © 2017 [employer] ° °

## 2016-10-05 NOTE — Progress Notes (Signed)
Pt declot procedure completed without sedation. Pt. Did not have a ride home. PIV removed.  Pt. Recovery time 30 minutes per MD. Preparing pt. Discharge. No questions per pt.

## 2016-10-05 NOTE — Procedures (Signed)
S/p left femoral AVG declot w/ 7 and 10 mm venous PTA  No comp Stable Full report in PACS

## 2016-10-07 ENCOUNTER — Encounter (HOSPITAL_COMMUNITY): Payer: Self-pay

## 2016-10-07 ENCOUNTER — Observation Stay (HOSPITAL_COMMUNITY)
Admission: EM | Admit: 2016-10-07 | Discharge: 2016-10-08 | Disposition: A | Discharge status: Home / Self Care | Payer: Self-pay | Attending: Family Medicine | Admitting: Family Medicine

## 2016-10-07 DIAGNOSIS — I120 Hypertensive chronic kidney disease with stage 5 chronic kidney disease or end stage renal disease: Secondary | ICD-10-CM | POA: Insufficient documentation

## 2016-10-07 DIAGNOSIS — Z992 Dependence on renal dialysis: Secondary | ICD-10-CM | POA: Insufficient documentation

## 2016-10-07 DIAGNOSIS — D649 Anemia, unspecified: Secondary | ICD-10-CM | POA: Diagnosis present

## 2016-10-07 DIAGNOSIS — Z789 Other specified health status: Secondary | ICD-10-CM | POA: Diagnosis present

## 2016-10-07 DIAGNOSIS — Z87891 Personal history of nicotine dependence: Secondary | ICD-10-CM | POA: Insufficient documentation

## 2016-10-07 DIAGNOSIS — E875 Hyperkalemia: Secondary | ICD-10-CM

## 2016-10-07 DIAGNOSIS — Y733 Surgical instruments, materials and gastroenterology and urology devices (including sutures) associated with adverse incidents: Secondary | ICD-10-CM | POA: Insufficient documentation

## 2016-10-07 DIAGNOSIS — I160 Hypertensive urgency: Secondary | ICD-10-CM | POA: Diagnosis present

## 2016-10-07 DIAGNOSIS — I10 Essential (primary) hypertension: Secondary | ICD-10-CM | POA: Diagnosis present

## 2016-10-07 DIAGNOSIS — N186 End stage renal disease: Secondary | ICD-10-CM

## 2016-10-07 DIAGNOSIS — T8249XA Other complication of vascular dialysis catheter, initial encounter: Principal | ICD-10-CM | POA: Insufficient documentation

## 2016-10-07 LAB — GLUCOSE, RANDOM: Glucose, Bld: 150 mg/dL — ABNORMAL HIGH (ref 65–99)

## 2016-10-07 LAB — CBC
HCT: 34.9 % — ABNORMAL LOW (ref 39.0–52.0)
Hemoglobin: 11.5 g/dL — ABNORMAL LOW (ref 13.0–17.0)
MCH: 30.4 pg (ref 26.0–34.0)
MCHC: 33 g/dL (ref 30.0–36.0)
MCV: 92.3 fL (ref 78.0–100.0)
PLATELETS: 312 10*3/uL (ref 150–400)
RBC: 3.78 MIL/uL — ABNORMAL LOW (ref 4.22–5.81)
RDW: 14.2 % (ref 11.5–15.5)
WBC: 5.7 10*3/uL (ref 4.0–10.5)

## 2016-10-07 LAB — BASIC METABOLIC PANEL
Anion gap: 17 — ABNORMAL HIGH (ref 5–15)
BUN: 68 mg/dL — AB (ref 6–20)
CALCIUM: 8.2 mg/dL — AB (ref 8.9–10.3)
CO2: 25 mmol/L (ref 22–32)
Chloride: 100 mmol/L — ABNORMAL LOW (ref 101–111)
Creatinine, Ser: 15.07 mg/dL — ABNORMAL HIGH (ref 0.61–1.24)
GFR calc Af Amer: 4 mL/min — ABNORMAL LOW (ref >60)
GFR, EST NON AFRICAN AMERICAN: 3 mL/min — AB (ref >60)
GLUCOSE: 77 mg/dL (ref 65–99)
Potassium: 5.2 mmol/L — ABNORMAL HIGH (ref 3.5–5.1)
SODIUM: 142 mmol/L (ref 135–145)

## 2016-10-07 LAB — GLUCOSE, CAPILLARY: GLUCOSE-CAPILLARY: 156 mg/dL — AB (ref 65–99)

## 2016-10-07 LAB — CBG MONITORING, ED: Glucose-Capillary: 147 mg/dL — ABNORMAL HIGH (ref 65–99)

## 2016-10-07 MED ORDER — DEXTROSE 10 % IV SOLN
Freq: Once | INTRAVENOUS | Status: AC
  Administered 2016-10-07: 22:00:00 via INTRAVENOUS

## 2016-10-07 MED ORDER — POLYETHYLENE GLYCOL 3350 17 G PO PACK: 17.0000 g | PACK | Freq: Every day | ORAL | Status: DC | PRN

## 2016-10-07 MED ORDER — CLONIDINE HCL 0.1 MG PO TABS
0.1000 mg | ORAL_TABLET | Freq: Two times a day (BID) | ORAL | Status: DC
  Administered 2016-10-08: 0.1 mg via ORAL
  Filled 2016-10-07: qty 1

## 2016-10-07 MED ORDER — RENA-VITE PO TABS
1.0000 | ORAL_TABLET | Freq: Every day | ORAL | Status: DC
  Administered 2016-10-08: 1 via ORAL
  Filled 2016-10-07: qty 1

## 2016-10-07 MED ORDER — CALCIUM CARBONATE ANTACID 500 MG PO CHEW
4000.0000 mg | CHEWABLE_TABLET | Freq: Three times a day (TID) | ORAL | Status: DC
  Administered 2016-10-08: 4000 mg via ORAL
  Filled 2016-10-07: qty 20

## 2016-10-07 MED ORDER — ONDANSETRON HCL 4 MG/2ML IJ SOLN: 4.0000 mg | Freq: Four times a day (QID) | INTRAMUSCULAR | Status: DC | PRN

## 2016-10-07 MED ORDER — INSULIN ASPART 100 UNIT/ML IV SOLN
5.0000 [IU] | Freq: Once | INTRAVENOUS | Status: DC
  Filled 2016-10-07 (×2): qty 0.05

## 2016-10-07 MED ORDER — AMLODIPINE BESYLATE 10 MG PO TABS
10.0000 mg | ORAL_TABLET | Freq: Every day | ORAL | Status: DC
  Administered 2016-10-08: 10 mg via ORAL
  Filled 2016-10-07: qty 1

## 2016-10-07 MED ORDER — SODIUM CHLORIDE 0.9% FLUSH: 3.0000 mL | Freq: Two times a day (BID) | INTRAVENOUS | Status: DC

## 2016-10-07 MED ORDER — INSULIN ASPART 100 UNIT/ML ~~LOC~~ SOLN
5.0000 [IU] | Freq: Once | SUBCUTANEOUS | Status: AC
  Administered 2016-10-07: 5 [IU] via SUBCUTANEOUS
  Filled 2016-10-07: qty 1

## 2016-10-07 MED ORDER — SODIUM CHLORIDE 0.9 % IV SOLN: 250.0000 mL | INTRAVENOUS | Status: DC | PRN

## 2016-10-07 MED ORDER — CALCIUM GLUCONATE 10 % IV SOLN
1.0000 g | Freq: Once | INTRAVENOUS | Status: AC
  Administered 2016-10-07: 1 g via INTRAVENOUS
  Filled 2016-10-07: qty 10

## 2016-10-07 MED ORDER — ACETAMINOPHEN 325 MG PO TABS
650.0000 mg | ORAL_TABLET | Freq: Four times a day (QID) | ORAL | Status: DC | PRN
  Administered 2016-10-08: 650 mg via ORAL
  Filled 2016-10-07: qty 2

## 2016-10-07 MED ORDER — HEPARIN SODIUM (PORCINE) 5000 UNIT/ML IJ SOLN: 5000.0000 [IU] | Freq: Three times a day (TID) | INTRAMUSCULAR | Status: DC

## 2016-10-07 MED ORDER — HYDRALAZINE HCL 20 MG/ML IJ SOLN: 10.0000 mg | INTRAMUSCULAR | Status: DC | PRN

## 2016-10-07 MED ORDER — SODIUM CHLORIDE 0.9% FLUSH: 3.0000 mL | INTRAVENOUS | Status: DC | PRN

## 2016-10-07 MED ORDER — HYDROCODONE-ACETAMINOPHEN 5-325 MG PO TABS
1.0000 | ORAL_TABLET | ORAL | Status: DC | PRN
  Administered 2016-10-08: 1 via ORAL
  Filled 2016-10-07: qty 1

## 2016-10-07 MED ORDER — INSULIN ASPART 100 UNIT/ML ~~LOC~~ SOLN: 5.0000 [IU] | Freq: Once | SUBCUTANEOUS | Status: DC

## 2016-10-07 MED ORDER — ONDANSETRON HCL 4 MG PO TABS: 4.0000 mg | ORAL_TABLET | Freq: Four times a day (QID) | ORAL | Status: DC | PRN

## 2016-10-07 MED ORDER — SODIUM POLYSTYRENE SULFONATE 15 GM/60ML PO SUSP
30.0000 g | Freq: Once | ORAL | Status: AC
  Administered 2016-10-07: 30 g via ORAL
  Filled 2016-10-07: qty 120

## 2016-10-07 NOTE — H&P (Signed)
History and Physical    Alok Minshall Brodbeck KNL:[id number] DOB: 02/23/1971 DOA: 10/07/2016  PCP: No PCP Per Patient   Patient coming from: Home, by way of HD center   Chief Complaint: Access problem at dialysis, couldn't be dialyzed   HPI: [name] is a 46 y.o. male with medical history significant for hypertension, chronic anemia, and end-stage renal disease on hemodialysis who presents from his dialysis center for evaluation of an access problem. Patient is typically dialyzed Mondays, Wednesdays, and Fridays through a graft in the proximal left lower extremity. The graft was felt to be clotted on 10/04/2016, dialysis cannot be performed, and he was sent for a declotting procedure the following day. Patient reports that after the declotting procedure, he completed a full dialysis session without incident; that was 10/05/2016. On the day of admission, he presented for his usual HD, but there were problems accessing the graft again, suspected secondary to clot. He was directed to the emergency department for further evaluation and management of this. He denies any significant dyspnea and denies chest pain or palpitations. Patient reports that he has been in his usual state of health and has no acute complaints.  ED Course: Upon arrival to the ED, patient is found to be afebrile, saturating well on room air, hypertensive to 170/110, and with vitals otherwise stable. EKG features normal sinus rhythm. Chemistry panels notable for potassium of 5.2, BUN 68, and serum creatinine 15.07. CBC is notable for a stable normocytic anemia with hemoglobin of 11.5. Nephrology was consulted by the ED physician and advised a medical admission with patient to be NPO after midnight for procedure in the morning, either declotting of the graft or placement of temporary catheter. Patient has remained hypertensive in the ED, but otherwise stable and with no complaints. He will be observed on the telemetry unit for  ongoing evaluation and management of end-stage renal disease with vascular access problems.  Review of Systems:  All other systems reviewed and apart from HPI, are negative.  Past Medical History:  Diagnosis Date  . Anemia   . Cough    DRY    . Depression   . ESRD on hemodialysis ([place])    HD [facility] MWF  . Headache(784.0)   . Hypertension   . Muscle spasms of neck    BACK, NECK  . Thyroid disease     Past Surgical History:  Procedure Laterality Date  . ARTERIOVENOUS GRAFT PLACEMENT Left    "forearm, it's not working; thigh"   . ARTERIOVENOUS GRAFT PLACEMENT Left 11/09/2015  . FALSE ANEURYSM REPAIR Left 11/09/2015   Procedure: REPAIR OF LEFT FEMORAL PSEUDOANEURYSM; REVISION  OF LEFT THIGH ARTERIOVENOUS GRAFT USING 6MM X 10 CM GORETEX GRAFT ;  Surgeon: [name], MD;  Location: [place];  Service: Vascular;  Laterality: Left;  . IR GENERIC HISTORICAL  07/15/2016   IR US GUIDE VASC ACCESS LEFT 07/15/2016 [name], MD [facility]  . IR GENERIC HISTORICAL Left 07/15/2016   IR THROMBECTOMY AV FISTULA W/THROMBOLYSIS/PTA INC/SHUNT/IMG LEFT 07/15/2016 [name], MD [facility]  . IR GENERIC HISTORICAL  10/05/2016   IR US GUIDE VASC ACCESS LEFT 10/05/2016 [name], MD [facility]  . IR GENERIC HISTORICAL Left 10/05/2016   IR THROMBECTOMY AV FISTULA W/THROMBOLYSIS/PTA INC/SHUNT/IMG LEFT 10/05/2016 [name], MD [facility]  . PARATHYROIDECTOMY N/A 04/24/2014   Procedure: TOTAL 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;  Surgeon: [name], MD;  Location: [place];  Service: General;  Laterality:  N/A;  NECK AND LEFT FOREARM  . PERIPHERAL VASCULAR CATHETERIZATION N/A 05/05/2016   Procedure: A/V Shuntogram;  Surgeon: Conrad Martinsville, MD;  Location: [place] [facility];  Service: Cardiovascular;  Laterality: N/A;  . PERIPHERAL VASCULAR CATHETERIZATION Left 05/05/2016   Procedure: Peripheral Vascular Balloon Angioplasty;  Surgeon: Conrad Cheswold, MD;  Location: [place] [facility];  Service: Cardiovascular;  Laterality: Left;  . PSEUDOANEURYSM REPAIR Left 11/09/2015  . THROMBECTOMY / ARTERIOVENOUS GRAFT REVISION Left 08/18/2015   thigh  . THROMBECTOMY AND REVISION OF ARTERIOVENTOUS (AV) GORETEX  GRAFT Left 08/23/2015   Procedure: THROMBECTOMY AND REVISION OF ARTERIOVENTOUS (AV) GORETEX  GRAFT LEFT THIGH ;  Surgeon: [name], MD;  Location: [place];  Service: Vascular;  Laterality: Left;  . THROMBECTOMY W/ EMBOLECTOMY Left 08/18/2015   Procedure: THROMBECTOMY  AND REVISION ARTERIOVENOUS GORE-TEX GRAFT/LEFT THIGH;  Surgeon: [name], MD;  Location: [place];  Service: Vascular;  Laterality: Left;  Marland Kitchen VENOGRAM Left 05/05/2016   Procedure: Venogram;  Surgeon: Conrad [place], MD;  Location: [place] [facility];  Service: Cardiovascular;  Laterality: Left;  lower extremity     reports that he quit smoking about 30 years ago. He has never used smokeless tobacco. He reports that he does not drink alcohol or use drugs.  Allergies  Allergen Reactions  . Lisinopril Cough    History reviewed. No pertinent family history.   Prior to Admission medications   Medication Sig Start Date End Date Taking? Authorizing Provider  acetaminophen (TYLENOL) 500 MG tablet Take 1,000 mg by mouth daily as needed for headache (pain).    Yes Historical Provider, MD  amLODipine (NORVASC) 10 MG tablet Take 10 mg by mouth at bedtime.    Yes Historical Provider, MD  calcium elemental as carbonate (TUMS ULTRA 1000) 400 MG chewable tablet Chew 2,000-4,000 mg by mouth See admin instructions. Chew 4 tablets (1000 mg) by mouth 3 times daily with meals and 2 tablets (2000 mg) with snacks   Yes Historical Provider, MD  multivitamin (RENA-VIT) TABS tablet Take 1 tablet by mouth daily.   Yes Historical Provider, MD  cloNIDine (CATAPRES) 0.1 MG tablet Take one tab by mouth daily for Blood pressure Patient taking differently: Take 0.1 mg by mouth daily. for Blood pressure 08/31/16   Kandra Nicolas, MD    Physical Exam: Vitals:   10/07/16 2130 10/07/16 2200 10/07/16 2230 10/07/16 2313  BP: (!) 156/103 165/100 (!) 174/107 (!) 147/91  Pulse: 73 71 74 71  Resp: 15 16 12 17   Temp:    97.7 F (36.5 C)  TempSrc:    Oral  SpO2: 100% 98% 99% 99%  Weight:    78.2 kg (172 lb 6.4 oz)  Height:    6' (1.829 m)      Constitutional: NAD, calm, comfortable Eyes: PERTLA, lids and conjunctivae normal ENMT: Mucous membranes are moist. Posterior pharynx clear of any exudate or lesions.   Neck: normal, supple, no masses, no thyromegaly Respiratory: clear to auscultation bilaterally, no wheezing, no crackles. Normal respiratory effort.   Cardiovascular: S1 & S2 heard, regular rate and rhythm. No carotid bruits. JVP 9 cm H2O. Abdomen: No distension, no tenderness, no masses palpated. Bowel sounds normal.  Musculoskeletal: no clubbing / cyanosis. No joint deformity upper and lower extremities. Normal muscle tone.  Skin: no significant rashes, lesions, ulcers. Warm, dry, well-perfused. Neurologic: CN 2-12 grossly intact. Sensation intact, DTR normal. Strength 5/5 in all 4 limbs.  Psychiatric: Normal  judgment and insight. Alert and oriented x 3. Normal mood and affect.     Labs on Admission: I have personally reviewed following labs and imaging studies  CBC:  Recent Labs Lab 10/07/16 1755  WBC 5.7  HGB 11.5*  HCT 34.9*  MCV 92.3  PLT 619   Basic Metabolic Panel:  Recent Labs Lab 10/07/16 1755  NA 142  K 5.2*  CL 100*  CO2 25  GLUCOSE 77  BUN 68*  CREATININE 15.07*  CALCIUM 8.2*   GFR: Estimated Creatinine Clearance: 6.9 mL/min (by C-G formula based on SCr of 15.07 mg/dL (H)). Liver Function Tests: No results for input(s): AST, ALT, ALKPHOS, BILITOT, PROT, ALBUMIN in the last 168 hours. No results for input(s): LIPASE, AMYLASE in the last 168 hours. No results for input(s): AMMONIA in the last 168 hours. Coagulation Profile: No results for input(s): INR, PROTIME in  the last 168 hours. Cardiac Enzymes: No results for input(s): CKTOTAL, CKMB, CKMBINDEX, TROPONINI in the last 168 hours. BNP (last 3 results) No results for input(s): PROBNP in the last 8760 hours. HbA1C: No results for input(s): HGBA1C in the last 72 hours. CBG:  Recent Labs Lab 10/07/16 2234 10/07/16 2316  GLUCAP 147* 156*   Lipid Profile: No results for input(s): CHOL, HDL, LDLCALC, TRIG, CHOLHDL, LDLDIRECT in the last 72 hours. Thyroid Function Tests: No results for input(s): TSH, T4TOTAL, FREET4, T3FREE, THYROIDAB in the last 72 hours. Anemia Panel: No results for input(s): VITAMINB12, FOLATE, FERRITIN, TIBC, IRON, RETICCTPCT in the last 72 hours. Urine analysis:    Component Value Date/Time   COLORURINE YELLOW 08/01/2009 0742   APPEARANCEUR TURBID (A) 08/01/2009 0742   LABSPEC 1.018 08/01/2009 0742   PHURINE 5.5 08/01/2009 0742   GLUCOSEU NEGATIVE 08/01/2009 0742   HGBUR LARGE (A) 08/01/2009 0742   BILIRUBINUR SMALL (A) 08/01/2009 0742   KETONESUR 15 (A) 08/01/2009 0742   PROTEINUR 100 (A) 08/01/2009 0742   UROBILINOGEN 1.0 08/01/2009 0742   NITRITE NEGATIVE 08/01/2009 0742   LEUKOCYTESUR LARGE (A) 08/01/2009 0742   Sepsis Labs: @LABRCNTIP (procalcitonin:4,lacticidven:4) )No results found for this or any previous visit (from the past 240 hour(s)).   Radiological Exams on Admission: No results found.  EKG: Independently reviewed. Normal sinus rhythm.  Assessment/Plan  1. Vascular access problem  - Pt presents from HD where AVG in LLE could not be accessed, suspected secondary to clot  - He underwent a declotting procedure on 1/3 and reports completing full HD session later that day - Potassium is 5.2 on admission without EKG changes, no respiratory distress or hypoxia  - Nephrology is consulting and much appreciated - Pt is kept NPO after midnight for possible temporary catheter placement in am    2. ESRD - Completed last HD on 10/05/16  - As above, admitted  for problem with accessing left groin AVG  - No indications for emergent HD overnight; NPO for possible catheter placement in am   3. Hypertension with hypertensive urgency  - Likely secondary to ESRD with mild volume o/l in setting of missed HD  - No s/s of end-organ damage - Continue Norvasc and clonidine as tolerated  - Hydralazine IVPs available prn   4. Anemia of CKD  - Hgb is 11.5 on admission  - Likely secondary to ESRD  - Hgb is stable and there is no evidence for bleeding   5. Hyperkalemia  - Serum potassium 5.2 on admission with no EKG changes appreciated  - He was treated with calcium, insulin/dextrose, and  Kayexalate in ED - Monitor on telemetry, repeat chem panel in am     DVT prophylaxis: sq heparin  Code Status: Full  Family Communication: Discussed with patient Disposition Plan: Observe on telemetry Consults called: Nephrology Admission status: Observation    Vianne Bulls, MD [facility] Pager [phone]  If 7PM-7AM, please contact night-coverage [email or web address] Password [email or web address]  10/07/2016, 11:45 PM

## 2016-10-07 NOTE — ED Provider Notes (Signed)
[facility] DEPT Provider Note   CSN: [id number] Arrival date & time: 10/07/16  1410     History   Chief Complaint Chief Complaint  Patient presents with  . Vascular Access Problem    HPI [name] is a 46 y.o. male.  Patient is a 46 year old male with history of ESRD on HD, hypertension, thyroid disease who presents from dialysis center with continued issues of his left thigh dialysis fistula. Interventional radiology tried to troubleshoot the fistula over the last 2 days but were unsuccessful. Patient last had dialysis on Wednesday but was not able to have dialysis today due to the clotted fistula. Patient denies any current symptoms such as chest pain, shortness of breath, leg swelling.   The history is provided by the patient.    Past Medical History:  Diagnosis Date  . Anemia   . Cough    DRY    . Depression   . ESRD on hemodialysis ([place])    HD [facility] MWF  . Headache(784.0)   . Hypertension   . Muscle spasms of neck    BACK, NECK  . Thyroid disease     Patient Active Problem List   Diagnosis Date Noted  . Problem with vascular access 10/07/2016  . Pseudoaneurysm of arteriovenous graft ([place]) 11/06/2015  . Cellulitis of left thigh 08/26/2015  . Abdominal pain 08/26/2015  . Complication from renal dialysis device   . ESRD (end stage renal disease) on dialysis ([place])   . ESRD (end stage renal disease) ([place])   . Sepsis ([place]) 12/14/2014  . Hypotension 12/14/2014  . Diarrhea   . Hypocalcemia   . Secondary hyperparathyroidism of renal origin ([place]) 04/24/2014  . Hyperparathyroidism, secondary ([place]) 03/21/2014    Past Surgical History:  Procedure Laterality Date  . ARTERIOVENOUS GRAFT PLACEMENT Left    "forearm, it's not working; thigh"   . ARTERIOVENOUS GRAFT PLACEMENT Left 11/09/2015  . FALSE ANEURYSM REPAIR Left 11/09/2015   Procedure: REPAIR OF LEFT FEMORAL PSEUDOANEURYSM; REVISION  OF LEFT THIGH ARTERIOVENOUS GRAFT USING 6MM X 10 CM  GORETEX GRAFT ;  Surgeon: [name], MD;  Location: [facility];  Service: Vascular;  Laterality: Left;  . IR GENERIC HISTORICAL  07/15/2016   IR US GUIDE VASC ACCESS LEFT 07/15/2016 [name], MD [facility]  . IR GENERIC HISTORICAL Left 07/15/2016   IR THROMBECTOMY AV FISTULA W/THROMBOLYSIS/PTA INC/SHUNT/IMG LEFT 07/15/2016 [name], MD [facility]  . IR GENERIC HISTORICAL  10/05/2016   IR US GUIDE VASC ACCESS LEFT 10/05/2016 [name], MD [facility]  . IR GENERIC HISTORICAL Left 10/05/2016   IR THROMBECTOMY AV FISTULA W/THROMBOLYSIS/PTA INC/SHUNT/IMG LEFT 10/05/2016 [name], MD [facility]  . PARATHYROIDECTOMY N/A 04/24/2014   Procedure: TOTAL 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;  Surgeon: [name], MD;  Location: [facility];  Service: General;  Laterality: N/A;  NECK AND LEFT FOREARM  . PERIPHERAL VASCULAR CATHETERIZATION N/A 05/05/2016   Procedure: A/V Shuntogram;  Surgeon: Conrad Foxfire, MD;  Location: [place] [facility];  Service: Cardiovascular;  Laterality: N/A;  . PERIPHERAL VASCULAR CATHETERIZATION Left 05/05/2016   Procedure: Peripheral Vascular Balloon Angioplasty;  Surgeon: Conrad [place], MD;  Location: [facility];  Service: Cardiovascular;  Laterality: Left;  . PSEUDOANEURYSM REPAIR Left 11/09/2015  . THROMBECTOMY / ARTERIOVENOUS GRAFT REVISION Left 08/18/2015   thigh  . THROMBECTOMY AND REVISION OF ARTERIOVENTOUS (AV) GORETEX  GRAFT Left 08/23/2015   Procedure: THROMBECTOMY AND REVISION OF ARTERIOVENTOUS (AV) GORETEX  GRAFT LEFT THIGH ;  Surgeon: [name], MD;  Location: [place];  Service: Vascular;  Laterality: Left;  . THROMBECTOMY W/ EMBOLECTOMY Left 08/18/2015   Procedure: THROMBECTOMY  AND REVISION ARTERIOVENOUS GORE-TEX GRAFT/LEFT THIGH;  Surgeon: [name], MD;  Location: [facility];  Service: Vascular;  Laterality: Left;  Marland Kitchen VENOGRAM Left 05/05/2016   Procedure: Venogram;  Surgeon: Conrad Silver Firs, MD;  Location: [place] [facility];   Service: Cardiovascular;  Laterality: Left;  lower extremity       Home Medications    Prior to Admission medications   Medication Sig Start Date End Date Taking? Authorizing Provider  acetaminophen (TYLENOL) 500 MG tablet Take 1,000 mg by mouth daily as needed for headache (pain).    Yes Historical Provider, MD  amLODipine (NORVASC) 10 MG tablet Take 10 mg by mouth at bedtime.    Yes Historical Provider, MD  calcium elemental as carbonate (TUMS ULTRA 1000) 400 MG chewable tablet Chew 2,000-4,000 mg by mouth See admin instructions. Chew 4 tablets (1000 mg) by mouth 3 times daily with meals and 2 tablets (2000 mg) with snacks   Yes Historical Provider, MD  multivitamin (RENA-VIT) TABS tablet Take 1 tablet by mouth daily.   Yes Historical Provider, MD  cloNIDine (CATAPRES) 0.1 MG tablet Take one tab by mouth daily for Blood pressure Patient taking differently: Take 0.1 mg by mouth daily. for Blood pressure 08/31/16   Kandra Nicolas, MD    Family History No family history on file.  Social History Social History  Substance Use Topics  . Smoking status: Former Smoker    Quit date: 03/21/1986  . Smokeless tobacco: Never Used  . Alcohol use No     Allergies   Lisinopril   Review of Systems Review of Systems  Constitutional: Negative for chills and fever.  Respiratory: Negative for cough and shortness of breath.   Cardiovascular: Negative for chest pain, palpitations and leg swelling (L leg always slightly more swollen than R leg).  Gastrointestinal: Negative for abdominal pain, diarrhea, nausea and vomiting.  Skin: Negative for rash.  Neurological: Negative for light-headedness and headaches.  All other systems reviewed and are negative.    Physical Exam Updated Vital Signs BP (!) 174/107   Pulse 74   Temp 98.4 F (36.9 C) (Oral)   Resp 12   SpO2 99%   Physical Exam  Constitutional: He appears well-developed and well-nourished.  HENT:  Head: Normocephalic and  atraumatic.  Mouth/Throat: Oropharynx is clear and moist.  Eyes: Conjunctivae are normal. No scleral icterus.  Neck: Neck supple.  Cardiovascular: Normal rate, regular rhythm and intact distal pulses.   No murmur heard. Pulmonary/Chest: Effort normal and breath sounds normal. No respiratory distress. He has no wheezes. He has no rales.  Abdominal: Soft. There is no tenderness.  Musculoskeletal: He exhibits no edema.       Arms:      Legs: Neurological: He is alert.  Skin: Skin is warm and dry.  Psychiatric: He has a normal mood and affect.  Nursing note and vitals reviewed.    ED Treatments / Results  Labs (all labs ordered are listed, but only abnormal results are displayed) Labs Reviewed  CBC - Abnormal; Notable for the following:       Result Value   RBC 3.78 (*)    Hemoglobin 11.5 (*)    HCT 34.9 (*)    All other components within normal limits  BASIC METABOLIC PANEL - Abnormal; Notable  for the following:    Potassium 5.2 (*)    Chloride 100 (*)    BUN 68 (*)    Creatinine, Ser 15.07 (*)    Calcium 8.2 (*)    GFR calc non Af Amer 3 (*)    GFR calc Af Amer 4 (*)    Anion gap 17 (*)    All other components within normal limits  CBG MONITORING, ED - Abnormal; Notable for the following:    Glucose-Capillary 147 (*)    All other components within normal limits  GLUCOSE, RANDOM    EKG  EKG Interpretation None       Radiology No results found.  Procedures Procedures (including critical care time)  Medications Ordered in ED Medications  dextrose 10 % infusion ( Intravenous New Bag/Given 10/07/16 2141)  calcium gluconate inj 10% (1 g) URGENT USE ONLY! (1 g Intravenous Given 10/07/16 2142)  sodium polystyrene (KAYEXALATE) 15 GM/60ML suspension 30 g (30 g Oral Given 10/07/16 2141)  insulin aspart (novoLOG) injection 5 Units (5 Units Subcutaneous Given 10/07/16 2207)     Initial Impression / Assessment and Plan / ED Course  I have reviewed the triage vital signs and  the nursing notes.  Pertinent labs & imaging results that were available during my care of the patient were reviewed by me and considered in my medical decision making (see chart for details).  Clinical Course    Patient is a 46 year old male with history hypertension, ESRD, thyroid issues who presents with vascular access problem. Patient's left upper leg fistula clotted off over the last few days and interventional radiology was not able to successfully reopened the fistula. Patient last had dialysis on Wednesday but was unable to today.   Patient is a symptomatically male with no shortness of breath or signs of fluid overload. Lungs are clear to auscultation. No significant for peripheral edema. No palpable thrill to the left thigh fistula. Potassium. Is 5.2 with EKG showing very mild peaked T waves. Patient given calcium, insulin, dextrose, Kayexalate. Plan to recheck glucose 1 hour after giving insulin.  I spoke with the nephrology team who recommends admitting the patient to medicine and likely interventional radiology will need to place a dialysis catheter tomorrow. Pt will be admitted to hospitalist.   Pt seen with attending Dr. Thomasene Lot.   Final Clinical Impressions(s) / ED Diagnoses   Final diagnoses:  ESRD (end stage renal disease) ([place])  Problem with vascular access  Hyperkalemia    New Prescriptions New Prescriptions   No medications on file     Tobie Poet, DO 10/07/16 [place], MD 10/12/16 [id number]

## 2016-10-07 NOTE — ED Triage Notes (Signed)
Pt was at dialysis today and was unable to be accessed. Dialyzed MWF. Denies any pain or feeling bad.

## 2016-10-07 NOTE — Consult Note (Signed)
Full note to follow  Pt presented to ED with report of inability to access AVG, recent revision.  Labs stable on RA.  Plan for [facility] in AM followed by HD>  FUrther plans regarding AVG tomorrow.    NPO p MN

## 2016-10-08 ENCOUNTER — Observation Stay (HOSPITAL_COMMUNITY): Payer: Self-pay

## 2016-10-08 ENCOUNTER — Encounter (HOSPITAL_COMMUNITY): Payer: Self-pay | Admitting: Interventional Radiology

## 2016-10-08 DIAGNOSIS — E875 Hyperkalemia: Secondary | ICD-10-CM

## 2016-10-08 HISTORY — PX: IR GENERIC HISTORICAL: IMG1180011

## 2016-10-08 LAB — BASIC METABOLIC PANEL WITH GFR
Anion gap: 14 (ref 5–15)
BUN: 71 mg/dL — ABNORMAL HIGH (ref 6–20)
CO2: 25 mmol/L (ref 22–32)
Calcium: 7.7 mg/dL — ABNORMAL LOW (ref 8.9–10.3)
Chloride: 101 mmol/L (ref 101–111)
Creatinine, Ser: 16.46 mg/dL — ABNORMAL HIGH (ref 0.61–1.24)
GFR calc Af Amer: 4 mL/min — ABNORMAL LOW
GFR calc non Af Amer: 3 mL/min — ABNORMAL LOW
Glucose, Bld: 68 mg/dL (ref 65–99)
Potassium: 5 mmol/L (ref 3.5–5.1)
Sodium: 140 mmol/L (ref 135–145)

## 2016-10-08 LAB — GLUCOSE, CAPILLARY: Glucose-Capillary: 67 mg/dL (ref 65–99)

## 2016-10-08 LAB — MRSA PCR SCREENING: MRSA by PCR: NEGATIVE

## 2016-10-08 LAB — POTASSIUM: POTASSIUM: 4.4 mmol/L (ref 3.5–5.1)

## 2016-10-08 MED ORDER — CEFAZOLIN SODIUM-DEXTROSE 2-4 GM/100ML-% IV SOLN
INTRAVENOUS | Status: AC
  Administered 2016-10-08: 2000 mg
  Filled 2016-10-08: qty 100

## 2016-10-08 MED ORDER — MIDAZOLAM HCL 2 MG/2ML IJ SOLN
INTRAMUSCULAR | Status: AC
  Filled 2016-10-08: qty 4

## 2016-10-08 MED ORDER — CALCIUM CARBONATE ANTACID 500 MG PO CHEW
4.0000 | CHEWABLE_TABLET | ORAL | Status: DC | PRN
  Filled 2016-10-08: qty 4

## 2016-10-08 MED ORDER — LIDOCAINE-EPINEPHRINE (PF) 1 %-1:200000 IJ SOLN
INTRAMUSCULAR | Status: AC
  Administered 2016-10-08: 20 mL
  Filled 2016-10-08: qty 30

## 2016-10-08 MED ORDER — MIDAZOLAM HCL 2 MG/2ML IJ SOLN
INTRAMUSCULAR | Status: AC | PRN
  Administered 2016-10-08 (×4): 1 mg via INTRAVENOUS

## 2016-10-08 MED ORDER — FENTANYL CITRATE (PF) 100 MCG/2ML IJ SOLN
INTRAMUSCULAR | Status: AC
  Filled 2016-10-08: qty 4

## 2016-10-08 MED ORDER — GELATIN ABSORBABLE 12-7 MM EX MISC
CUTANEOUS | Status: AC
  Administered 2016-10-08: 12:00:00
  Filled 2016-10-08: qty 1

## 2016-10-08 MED ORDER — FENTANYL CITRATE (PF) 100 MCG/2ML IJ SOLN
INTRAMUSCULAR | Status: AC | PRN
  Administered 2016-10-08 (×3): 50 ug via INTRAVENOUS

## 2016-10-08 MED ORDER — HEPARIN SODIUM (PORCINE) 1000 UNIT/ML IJ SOLN
INTRAMUSCULAR | Status: AC
  Filled 2016-10-08: qty 1

## 2016-10-08 NOTE — Progress Notes (Signed)
PROGRESS NOTE    Byrant Valent Province  NIO:[id number] DOB: June 20, 1971 DOA: 10/07/2016 PCP: No PCP Per Patient    Brief Narrative:   46 y.o. male with medical history significant for hypertension, chronic anemia, and end-stage renal disease on hemodialysis who presents from his dialysis center for evaluation of an access problem. Patient is typically dialyzed Mondays, Wednesdays, and Fridays through a graft in the proximal left lower extremity. The graft was felt to be clotted on 10/04/2016, dialysis cannot be performed, and he was sent for a declotting procedure the following day. Patient reports that after the declotting procedure, he completed a full dialysis session without incident; that was 10/05/2016. On the day of admission, he presented for his usual HD, but there were problems accessing the graft again, suspected secondary to clot  Assessment & Plan:   Principal Problem:   Problem with vascular access - IR consulted  Active Problems:   ESRD (end stage renal disease) on dialysis [facility]) - Nephrology managing    Hypertension - Amlodipine and clonidine    Normocytic anemia - stable    Hyperkalemia - resolved with dialysis   DVT prophylaxis: Heparin Code Status: Full Family Communication: None at bedside Disposition Plan: Once cleared for d/c by Nephrology   Consultants:   Nephrology   IR   Procedures: None   Antimicrobials: None   Subjective: Pt has no new complaints. No acute issues overnight.  Objective: Vitals:   10/08/16 1325 10/08/16 1400 10/08/16 1430 10/08/16 1455  BP: (!) 165/115 (!) 146/103 (!) 151/105 (!) 146/106  Pulse: 77 90 85 86  Resp: (!) 21 14 12 14   Temp:      TempSrc:      SpO2:      Weight:      Height:        Intake/Output Summary (Last 24 hours) at 10/08/16 1456 Last data filed at 10/08/16 0900  Gross per 24 hour  Intake                0 ml  Output                0 ml  Net                0 ml   Filed Weights   10/07/16  2313 10/08/16 1255  Weight: 78.2 kg (172 lb 6.4 oz) 80.1 kg (176 lb 9.4 oz)    Examination:  General exam: Appears calm and comfortable  Respiratory system: Clear to auscultation. Respiratory effort normal. Cardiovascular system: S1 & S2 heard, RRR.  Gastrointestinal system: Abdomen is nondistended, soft and nontender. No organomegaly or masses felt. Normal bowel sounds heard. Central nervous system: Alert and oriented. No focal neurological deficits. Extremities: Symmetric 5 x 5 power. Skin: No rashes, lesions or ulcers, on limited exam. Psychiatry: Judgement and insight appear normal. Mood & affect appropriate.   Data Reviewed: I have personally reviewed following labs and imaging studies  CBC:  Recent Labs Lab 10/07/16 1755  WBC 5.7  HGB 11.5*  HCT 34.9*  MCV 92.3  PLT 818   Basic Metabolic Panel:  Recent Labs Lab 10/07/16 1755 10/07/16 2317 10/07/16 2359 10/08/16 0557  NA 142  --   --  140  K 5.2*  --  4.4 5.0  CL 100*  --   --  101  CO2 25  --   --  25  GLUCOSE 77 150*  --  68  BUN 68*  --   --  71*  CREATININE 15.07*  --   --  16.46*  CALCIUM 8.2*  --   --  7.7*   GFR: Estimated Creatinine Clearance: 6.3 mL/min (by C-G formula based on SCr of 16.46 mg/dL (H)). Liver Function Tests: No results for input(s): AST, ALT, ALKPHOS, BILITOT, PROT, ALBUMIN in the last 168 hours. No results for input(s): LIPASE, AMYLASE in the last 168 hours. No results for input(s): AMMONIA in the last 168 hours. Coagulation Profile: No results for input(s): INR, PROTIME in the last 168 hours. Cardiac Enzymes: No results for input(s): CKTOTAL, CKMB, CKMBINDEX, TROPONINI in the last 168 hours. BNP (last 3 results) No results for input(s): PROBNP in the last 8760 hours. HbA1C: No results for input(s): HGBA1C in the last 72 hours. CBG:  Recent Labs Lab 10/07/16 2234 10/07/16 2316 10/08/16 0749  GLUCAP 147* 156* 67   Lipid Profile: No results for input(s): CHOL, HDL,  LDLCALC, TRIG, CHOLHDL, LDLDIRECT in the last 72 hours. Thyroid Function Tests: No results for input(s): TSH, T4TOTAL, FREET4, T3FREE, THYROIDAB in the last 72 hours. Anemia Panel: No results for input(s): VITAMINB12, FOLATE, FERRITIN, TIBC, IRON, RETICCTPCT in the last 72 hours. Sepsis Labs: No results for input(s): PROCALCITON, LATICACIDVEN in the last 168 hours.  Recent Results (from the past 240 hour(s))  MRSA PCR Screening     Status: None   Collection Time: 10/07/16 11:38 PM  Result Value Ref Range Status   MRSA by PCR NEGATIVE NEGATIVE Final    Comment:        The GeneXpert MRSA Assay (FDA approved for NASAL specimens only), is one component of a comprehensive MRSA colonization surveillance program. It is not intended to diagnose MRSA infection nor to guide or monitor treatment for MRSA infections.          Radiology Studies: Ir Fluoro Guide Cv Line Right  Result Date: 10/08/2016 INDICATION: END-STAGE RENAL DISEASE, RE OCCLUSION OF THE CHRONIC LEFT FEMORAL AV GRAFT. NO CURRENT ACCESS FOR DIALYSIS. EXAM: ULTRASOUND GUIDANCE FOR VASCULAR ACCESS RIGHT INTERNAL JUGULAR PERMANENT HEMODIALYSIS CATHETER Date:  1/6/20181/03/2017 12:16 pm Radiologist:  M. Daryll Brod, MD Guidance:  Ultrasound and fluoroscopic FLUOROSCOPY TIME:  Fluoroscopy Time:  30 seconds (2 mGy). MEDICATIONS: 2 g Ancef administered within 1 hour of the procedure ANESTHESIA/SEDATION: Versed 4.0 mg IV; Fentanyl 150 mcg IV; Moderate Sedation Time:  19 minutes The patient was continuously monitored during the procedure by the interventional radiology nurse under my direct supervision. CONTRAST:  None. COMPLICATIONS: None immediate. PROCEDURE: Informed consent was obtained from the patient following explanation of the procedure, risks, benefits and alternatives. The patient understands, agrees and consents for the procedure. All questions were addressed. A time out was performed. Maximal barrier sterile technique utilized  including caps, mask, sterile gowns, sterile gloves, large sterile drape, hand hygiene, and 2% chlorhexidine scrub. Under sterile conditions and local anesthesia, right internal jugular micropuncture venous access was performed with ultrasound. Images were obtained for documentation. A guide wire was inserted followed by a transitional dilator. Next, a 0.035 guidewire was advanced into the IVC with a 5-French catheter. Measurements were obtained from the right venotomy site to the proximal right atrium. In the right infraclavicular chest, a subcutaneous tunnel was created under sterile conditions and local anesthesia. 1% lidocaine with epinephrine was utilized for this. The 19 cm tip to cuff dialysis palindrome catheter was tunneled subcutaneously to the venotomy site and inserted into the SVC/RA junction through a valved peel-away sheath. Position was confirmed with fluoroscopy. Images were obtained  for documentation. Blood was aspirated from the catheter followed by saline and heparin flushes. The appropriate volume and strength of heparin was instilled in each lumen. Caps were applied. The catheter was secured at the tunnel site with Gelfoam and a pursestring suture. The venotomy site was closed with subcuticular Vicryl suture. Dermabond was applied to the small right neck incision. A dry sterile dressing was applied. The catheter is ready for use. No immediate complications. IMPRESSION: Ultrasound and fluoroscopically guided right internal jugular tunneled hemodialysis catheter (19 cm palindrome catheter). Electronically Signed   By: Jerilynn Mages.  Shick M.D.   On: 10/08/2016 12:22   Ir US Guide Vasc Access Right  Result Date: 10/08/2016 INDICATION: END-STAGE RENAL DISEASE, RE OCCLUSION OF THE CHRONIC LEFT FEMORAL AV GRAFT. NO CURRENT ACCESS FOR DIALYSIS. EXAM: ULTRASOUND GUIDANCE FOR VASCULAR ACCESS RIGHT INTERNAL JUGULAR PERMANENT HEMODIALYSIS CATHETER Date:  1/6/20181/03/2017 12:16 pm Radiologist:  M. Daryll Brod, MD  Guidance:  Ultrasound and fluoroscopic FLUOROSCOPY TIME:  Fluoroscopy Time:  30 seconds (2 mGy). MEDICATIONS: 2 g Ancef administered within 1 hour of the procedure ANESTHESIA/SEDATION: Versed 4.0 mg IV; Fentanyl 150 mcg IV; Moderate Sedation Time:  19 minutes The patient was continuously monitored during the procedure by the interventional radiology nurse under my direct supervision. CONTRAST:  None. COMPLICATIONS: None immediate. PROCEDURE: Informed consent was obtained from the patient following explanation of the procedure, risks, benefits and alternatives. The patient understands, agrees and consents for the procedure. All questions were addressed. A time out was performed. Maximal barrier sterile technique utilized including caps, mask, sterile gowns, sterile gloves, large sterile drape, hand hygiene, and 2% chlorhexidine scrub. Under sterile conditions and local anesthesia, right internal jugular micropuncture venous access was performed with ultrasound. Images were obtained for documentation. A guide wire was inserted followed by a transitional dilator. Next, a 0.035 guidewire was advanced into the IVC with a 5-French catheter. Measurements were obtained from the right venotomy site to the proximal right atrium. In the right infraclavicular chest, a subcutaneous tunnel was created under sterile conditions and local anesthesia. 1% lidocaine with epinephrine was utilized for this. The 19 cm tip to cuff dialysis palindrome catheter was tunneled subcutaneously to the venotomy site and inserted into the SVC/RA junction through a valved peel-away sheath. Position was confirmed with fluoroscopy. Images were obtained for documentation. Blood was aspirated from the catheter followed by saline and heparin flushes. The appropriate volume and strength of heparin was instilled in each lumen. Caps were applied. The catheter was secured at the tunnel site with Gelfoam and a pursestring suture. The venotomy site was closed  with subcuticular Vicryl suture. Dermabond was applied to the small right neck incision. A dry sterile dressing was applied. The catheter is ready for use. No immediate complications. IMPRESSION: Ultrasound and fluoroscopically guided right internal jugular tunneled hemodialysis catheter (19 cm palindrome catheter). Electronically Signed   By: Jerilynn Mages.  Shick M.D.   On: 10/08/2016 12:22        Scheduled Meds: . amLODipine  10 mg Oral QHS  . calcium carbonate  4,000 mg Oral TID WC  . cloNIDine  0.1 mg Oral BID  . fentaNYL      . heparin      . heparin  5,000 Units Subcutaneous Q8H  . midazolam      . multivitamin  1 tablet Oral QHS  . sodium chloride flush  3 mL Intravenous Q12H  . sodium chloride flush  3 mL Intravenous Q12H   Continuous Infusions:   LOS: 0  days    Time spent:25 minutes  Velvet Bathe, MD [facility] Pager [phone]  If 7PM-7AM, please contact night-coverage [email or web address] Password [email or web address] 10/08/2016, 2:56 PM

## 2016-10-08 NOTE — Progress Notes (Signed)
Chief Complaint: Patient was seen in consultation today for  Chief Complaint  Patient presents with  . Vascular Access Problem   at the request of Dr. Pearson Grippe  Referring Physician(s): Dr. Pearson Grippe  Supervising Physician: Daryll Brod  Patient Status: [facility] - In-pt  History of Present Illness: [name] is a 46 y.o. male with ESRd and chronic HD access issues. Has had a left thigh loop AVG for many years. This IR service has performed numerous interventions and declot procedures on this graft over the past few years, including 3 days ago. AVG has gone down again. Pt will need alternative access until AVG can be addressed by vascular surgery team. Last HD cath placed by this IR service was ([place] in 2014. Apparent prior hx of SVC stenosis. Chart, PMHx, meds, labs reviewed. Has been NPO this am  Past Medical History:  Diagnosis Date  . Anemia   . Cough    DRY    . Depression   . ESRD on hemodialysis ([place])    HD [place] MWF  . Headache(784.0)   . Hypertension   . Muscle spasms of neck    BACK, NECK  . Thyroid disease     Past Surgical History:  Procedure Laterality Date  . ARTERIOVENOUS GRAFT PLACEMENT Left    "forearm, it's not working; thigh"   . ARTERIOVENOUS GRAFT PLACEMENT Left 11/09/2015  . FALSE ANEURYSM REPAIR Left 11/09/2015   Procedure: REPAIR OF LEFT FEMORAL PSEUDOANEURYSM; REVISION  OF LEFT THIGH ARTERIOVENOUS GRAFT USING 6MM X 10 CM GORETEX GRAFT ;  Surgeon: [name], MD;  Location: [place];  Service: Vascular;  Laterality: Left;  . IR GENERIC HISTORICAL  07/15/2016   IR US GUIDE VASC ACCESS LEFT 07/15/2016 [name], MD [facility]  . IR GENERIC HISTORICAL Left 07/15/2016   IR THROMBECTOMY AV FISTULA W/THROMBOLYSIS/PTA INC/SHUNT/IMG LEFT 07/15/2016 [name], MD [facility]  . IR GENERIC HISTORICAL  10/05/2016   IR US GUIDE VASC ACCESS LEFT 10/05/2016 [name], MD [facility]  . IR GENERIC HISTORICAL Left 10/05/2016     IR THROMBECTOMY AV FISTULA W/THROMBOLYSIS/PTA INC/SHUNT/IMG LEFT 10/05/2016 [name], MD [facility]  . PARATHYROIDECTOMY N/A 04/24/2014   Procedure: TOTAL 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;  Surgeon: [name], MD;  Location: [place];  Service: General;  Laterality: N/A;  NECK AND LEFT FOREARM  . PERIPHERAL VASCULAR CATHETERIZATION N/A 05/05/2016   Procedure: A/V Shuntogram;  Surgeon: Conrad Klondike, MD;  Location: [place] [facility];  Service: Cardiovascular;  Laterality: N/A;  . PERIPHERAL VASCULAR CATHETERIZATION Left 05/05/2016   Procedure: Peripheral Vascular Balloon Angioplasty;  Surgeon: Conrad [place], MD;  Location: [place] [facility];  Service: Cardiovascular;  Laterality: Left;  . PSEUDOANEURYSM REPAIR Left 11/09/2015  . THROMBECTOMY / ARTERIOVENOUS GRAFT REVISION Left 08/18/2015   thigh  . THROMBECTOMY AND REVISION OF ARTERIOVENTOUS (AV) GORETEX  GRAFT Left 08/23/2015   Procedure: THROMBECTOMY AND REVISION OF ARTERIOVENTOUS (AV) GORETEX  GRAFT LEFT THIGH ;  Surgeon: [name], MD;  Location: [place];  Service: Vascular;  Laterality: Left;  . THROMBECTOMY W/ EMBOLECTOMY Left 08/18/2015   Procedure: THROMBECTOMY  AND REVISION ARTERIOVENOUS GORE-TEX GRAFT/LEFT THIGH;  Surgeon: [name], MD;  Location: [place];  Service: Vascular;  Laterality: Left;  Marland Kitchen VENOGRAM Left 05/05/2016   Procedure: Venogram;  Surgeon: Conrad , MD;  Location: [place] [facility];  Service: Cardiovascular;  Laterality: Left;  lower extremity  Allergies: Lisinopril  Medications:  Current Facility-Administered Medications:  .  fentaNYL (SUBLIMAZE) 100 MCG/2ML injection, , , ,  .  heparin 1000 UNIT/ML injection, , , ,  .  lidocaine-EPINEPHrine (XYLOCAINE-EPINEPHrine) 1 %-1:200000 (PF) injection, , , ,  .  midazolam (VERSED) 2 MG/2ML injection, , , ,  .  0.9 %  sodium chloride infusion, 250 mL, Intravenous, PRN, Ilene Qua Opyd, MD .  acetaminophen (TYLENOL) tablet 650 mg, 650  mg, Oral, Q6H PRN, Vianne Bulls, MD, 650 mg at 10/08/16 0853 .  amLODipine (NORVASC) tablet 10 mg, 10 mg, Oral, QHS, Ilene Qua Opyd, MD, 10 mg at 10/08/16 0056 .  calcium carbonate (TUMS - dosed in mg elemental calcium) chewable tablet 4,000 mg, 4,000 mg, Oral, TID WC, Ilene Qua Opyd, MD .  calcium carbonate (TUMS - dosed in mg elemental calcium) chewable tablet 800 mg of elemental calcium, 4 tablet, Oral, PRN, Ilene Qua Opyd, MD .  cloNIDine (CATAPRES) tablet 0.1 mg, 0.1 mg, Oral, BID, Ilene Qua Opyd, MD, 0.1 mg at 10/08/16 0056 .  heparin injection 5,000 Units, 5,000 Units, Subcutaneous, Q8H, Timothy S Opyd, MD .  hydrALAZINE (APRESOLINE) injection 10 mg, 10 mg, Intravenous, Q4H PRN, Ilene Qua Opyd, MD .  HYDROcodone-acetaminophen (NORCO/VICODIN) 5-325 MG per tablet 1-2 tablet, 1-2 tablet, Oral, Q4H PRN, Ilene Qua Opyd, MD .  multivitamin (RENA-VIT) tablet 1 tablet, 1 tablet, Oral, QHS, Vianne Bulls, MD, 1 tablet at 10/08/16 0056 .  ondansetron (ZOFRAN) tablet 4 mg, 4 mg, Oral, Q6H PRN **OR** ondansetron (ZOFRAN) injection 4 mg, 4 mg, Intravenous, Q6H PRN, Ilene Qua Opyd, MD .  polyethylene glycol (MIRALAX / GLYCOLAX) packet 17 g, 17 g, Oral, Daily PRN, Ilene Qua Opyd, MD .  sodium chloride flush (NS) 0.9 % injection 3 mL, 3 mL, Intravenous, Q12H, Timothy S Opyd, MD .  sodium chloride flush (NS) 0.9 % injection 3 mL, 3 mL, Intravenous, Q12H, Timothy S Opyd, MD .  sodium chloride flush (NS) 0.9 % injection 3 mL, 3 mL, Intravenous, PRN, Vianne Bulls, MD    History reviewed. No pertinent family history.  Social History   Social History  . Marital status: Married    Spouse name: N/A  . Number of children: N/A  . Years of education: N/A   Social History Main Topics  . Smoking status: Former Smoker    Quit date: 03/21/1986  . Smokeless tobacco: Never Used  . Alcohol use No  . Drug use: No  . Sexual activity: Not Asked   Other Topics Concern  . None   Social History Narrative  .  None     Review of Systems: A 12 point ROS discussed and pertinent positives are indicated in the HPI above.  All other systems are negative.  Review of Systems  Vital Signs: BP (!) 163/104 (BP Location: Right Arm)   Pulse 64   Temp 98.5 F (36.9 C) (Oral)   Resp 18   Ht 6' (1.829 m)   Wt 172 lb 6.4 oz (78.2 kg)   SpO2 100%   BMI 23.38 kg/m   Physical Exam  Constitutional: He is oriented to person, place, and time. He appears well-developed and well-nourished. No distress.  HENT:  Head: Normocephalic.  Mouth/Throat: Oropharynx is clear and moist.  Neck: Normal range of motion. No JVD present.  Cardiovascular: Normal rate, regular rhythm and normal heart sounds.   Pulmonary/Chest: Effort normal and breath sounds normal. No respiratory distress.  Musculoskeletal:  (L)thigh AVG without  pulse or bruit  Neurological: He is alert and oriented to person, place, and time.  Skin: Skin is warm and dry.  Psychiatric: He has a normal mood and affect. Judgment normal.     Mallampati Score:  MD Evaluation Airway: WNL Heart: WNL Abdomen: WNL Chest/ Lungs: WNL ASA  Classification: 3 Mallampati/Airway Score: One  Imaging: Ir US Guide Vasc Access Left  Result Date: 10/05/2016 CLINICAL DATA:  END-STAGE RENAL DISEASE, OCCLUSION OF THE LEFT FEMORAL AV graft EXAM: ULTRASOUND GUIDANCE FOR VASCULAR ACCESS LEFT FEMORAL AV GRAFT THROMBECTOMY/THROMBOLYSIS VENOUS GRAFT ANGIOPLASTY Date:  1/3/20181/12/2016 11:00 am Radiologist:  M. Daryll Brod, MD Guidance:  Ultrasound and fluoroscopic MEDICATIONS: 1% lidocaine locally, 3000 units heparin, 2 mg tPA. ANESTHESIA/SEDATION: Moderate Sedation Time:  None.   patient does not have a driver. The patient was continuously monitored during the procedure by the interventional radiology nurse under my direct supervision. FLUOROSCOPY TIME:  Fluoroscopy Time: 10 minutes 6 seconds (90 mGy). CONTRAST:  76mL ISOVUE-300 IOPAMIDOL (ISOVUE-300) INJECTION 40%  COMPLICATIONS: None immediate. PROCEDURE: Informed consent was obtained from the patient following explanation of the procedure, risks, benefits and alternatives. The patient understands, agrees and consents for the procedure. All questions were addressed. A time out was performed. Maximal barrier sterile technique utilized including caps, mask, sterile gowns, sterile gloves, large sterile drape, hand hygiene, and betadine prep. Under sterile conditions and local anesthesia, the left femoral loop graft was accessed along the arterial and venous limbs with ultrasound. Over guide wires, a 7-French sheath was inserted into the arterial limb and a 6-French sheath was inserted into the venous limb. Contrast injection confirms thrombosis of the graft. 2 mg of t-PA was instilled for thrombolysis. A catheter and guidewire were advanced across the venous anastomosis into the central iliac veins. Central iliac venogram confirms patency of the iliac veins and the IVC. 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 No central stenosis or large clot burden. Pullback venogram confirms thrombotic occlusion of the venous anastomosis to the common femoral vein. Over a guide wire, mechanical thrombectomy was performed with the Tretola thrombectomy device. Repeat injection demonstrates no significant residual clot burden within the graft. 3000 units of heparin was instilled into the graft. Venous angioplasty was performed of the venous anastomosis to the common femoral vein with a 7 mm balloon. This was done in an overlapping fashion. Angioplasty was also performed of the venous limb of the graft. To reestablished inflow, a 5.5 [place] Fogarty catheter was advanced across the arterial anastomosis over a glide wire. Fogarty embolectomy was performed of the arterial plug 3 times. This reestablished inflow to the graft. Both sheaths were back bled and syringe  aspirated. Contrast injection confirms patency of the graft. Completion shuntogram was performed. Shuntogram: Following thrombectomy, thrombolysis and angioplasty, the left femoral loop graft has restored flow. The loop graft is patent. Venous anastomosis is patent to the common femoral vein. No significant residual intragraft stenosis or thrombus. 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 Additional overlapping 10 mm prolonged angioplasty performed of the left external iliac vein and left common femoral vein. Three overlapping inflations performed to approximately 15 atmospheres. Final outflow venogram demonstrates improvement but a persistent external iliac stenosis remains. Post angioplasty result is similar to 07/15/2016. Therefore, the procedure was stopped. Sheaths were removed. Hemostasis obtained with pursestring sutures. No immediate complication. Patient tolerated the procedure well. IMPRESSION: Successful left femoral loop graft thrombectomy, thrombolysis and angioplasty  to restore flow. Access is ready for use. Electronically Signed   By: Jerilynn Mages.  Shick M.D.   On: 10/05/2016 11:29   Ir Thrombectomy Av Fistula W/thrombolysis/pta Inc/shunt/img Left  Result Date: 10/05/2016 CLINICAL DATA:  END-STAGE RENAL DISEASE, OCCLUSION OF THE LEFT FEMORAL AV graft EXAM: ULTRASOUND GUIDANCE FOR VASCULAR ACCESS LEFT FEMORAL AV GRAFT THROMBECTOMY/THROMBOLYSIS VENOUS GRAFT ANGIOPLASTY Date:  1/3/20181/12/2016 11:00 am Radiologist:  M. Daryll Brod, MD Guidance:  Ultrasound and fluoroscopic MEDICATIONS: 1% lidocaine locally, 3000 units heparin, 2 mg tPA. ANESTHESIA/SEDATION: Moderate Sedation Time:  None.   patient does not have a driver. The patient was continuously monitored during the procedure by the interventional radiology nurse under my direct supervision. FLUOROSCOPY TIME:  Fluoroscopy Time: 10 minutes 6 seconds (90 mGy). CONTRAST:  40mL ISOVUE-300 IOPAMIDOL (ISOVUE-300) INJECTION 53%  COMPLICATIONS: None immediate. PROCEDURE: Informed consent was obtained from the patient following explanation of the procedure, risks, benefits and alternatives. The patient understands, agrees and consents for the procedure. All questions were addressed. A time out was performed. Maximal barrier sterile technique utilized including caps, mask, sterile gowns, sterile gloves, large sterile drape, hand hygiene, and betadine prep. Under sterile conditions and local anesthesia, the left femoral loop graft was accessed along the arterial and venous limbs with ultrasound. Over guide wires, a 7-French sheath was inserted into the arterial limb and a 6-French sheath was inserted into the venous limb. Contrast injection confirms thrombosis of the graft. 2 mg of t-PA was instilled for thrombolysis. A catheter and guidewire were advanced across the venous anastomosis into the central iliac veins. Central iliac venogram confirms patency of the iliac veins and the IVC. 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 No central stenosis or large clot burden. Pullback venogram confirms thrombotic occlusion of the venous anastomosis to the common femoral vein. Over a guide wire, mechanical thrombectomy was performed with the Tretola thrombectomy device. Repeat injection demonstrates no significant residual clot burden within the graft. 3000 units of heparin was instilled into the graft. Venous angioplasty was performed of the venous anastomosis to the common femoral vein with a 7 mm balloon. This was done in an overlapping fashion. Angioplasty was also performed of the venous limb of the graft. To reestablished inflow, a 5.5 [place] Fogarty catheter was advanced across the arterial anastomosis over a glide wire. Fogarty embolectomy was performed of the arterial plug 3 times. This reestablished inflow to the graft. Both sheaths were back bled and syringe  aspirated. Contrast injection confirms patency of the graft. Completion shuntogram was performed. Shuntogram: Following thrombectomy, thrombolysis and angioplasty, the left femoral loop graft has restored flow. The loop graft is patent. Venous anastomosis is patent to the common femoral vein. No significant residual intragraft stenosis or thrombus. 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 Additional overlapping 10 mm prolonged angioplasty performed of the left external iliac vein and left common femoral vein. Three overlapping inflations performed to approximately 15 atmospheres. Final outflow venogram demonstrates improvement but a persistent external iliac stenosis remains. Post angioplasty result is similar to 07/15/2016. Therefore, the procedure was stopped. Sheaths were removed. Hemostasis obtained with pursestring sutures. No immediate complication. Patient tolerated the procedure well. IMPRESSION: Successful left femoral loop graft thrombectomy, thrombolysis and angioplasty to restore flow. Access is ready for use. Electronically Signed   By: Jerilynn Mages.  Shick M.D.   On: 10/05/2016 11:29    Labs:  CBC:  Recent Labs  11/10/15 0055  05/05/16 0835 06/01/16 0956 07/15/16 0803 10/07/16 1755  WBC 5.9  --   --  5.9 9.3 5.7  HGB 8.9*  < > 12.9* 12.0* 11.4* 11.5*  HCT 27.2*  < > 38.0* 38.0* 35.4* 34.9*  PLT 217  --   --  266 250 312  < > = values in this interval not displayed.  COAGS:  Recent Labs  07/15/16 0803  INR 0.98  APTT 34    BMP:  Recent Labs  06/01/16 0956 07/15/16 0803 10/07/16 1755 10/07/16 2317 10/07/16 2359 10/08/16 0557  NA 141 137 142  --   --  140  K 4.3 4.2 5.2*  --  4.4 5.0  CL 98* 95* 100*  --   --  101  CO2 26 28 25   --   --  25  GLUCOSE 84 89 77 150*  --  68  BUN 61* 45* 68*  --   --  71*  CALCIUM 9.2 10.9* 8.2*  --   --  7.7*  CREATININE 16.49* 15.10* 15.07*  --   --  16.46*  GFRNONAA 3* 3* 3*  --   --  3*  GFRAA 4* 4* 4*  --   --   4*    LIVER FUNCTION TESTS:  Recent Labs  06/01/16 0956  BILITOT 0.4  AST 15  ALT 13*  ALKPHOS 66  PROT 8.2*  ALBUMIN 3.8    TUMOR MARKERS: No results for input(s): AFPTM, CEA, CA199, CHROMGRNA in the last 8760 hours.  Assessment and Plan: Recurrent thrombosis of (L)thigh AVG Recommend VVS consult to address. Will plan for placement of tunneled HD cath today Discussed with pt. Risks and Benefits discussed with the patient including, but not limited to bleeding, infection, pneumothorax, or fibrin sheath development and need for additional procedures. All of the patient's questions were answered, patient is agreeable to proceed. Consent signed and in chart.    Thank you for this interesting consult.  I greatly enjoyed meeting Nahiem Abdou Reali and look forward to participating in their care.  A copy of this report was sent to the requesting provider on this date.  Electronically Signed: Ascencion Dike 10/08/2016, 11:46 AM   I spent a total of 20 minutes in face to face in clinical consultation, greater than 50% of which was counseling/coordinating care for Permcath placement

## 2016-10-08 NOTE — Discharge Summary (Signed)
Physician Discharge Summary  [name] Strada ZOX:[id number] DOB: 04/25/1971 DOA: 10/07/2016  PCP: No PCP Per Patient  Admit date: 10/07/2016 Discharge date: 10/08/2016  Time spent:> 35 minutes  Recommendations for Outpatient Follow-up:  1. Ensure patient has plans for to get another access given recently clotted off access   Discharge Diagnoses:  Principal Problem:   Problem with vascular access Active Problems:   ESRD (end stage renal disease) on dialysis [facility])   Hypertension   Hypertensive urgency   Normocytic anemia   Hyperkalemia   Discharge Condition: stable  Diet recommendation: renal diety  Filed Weights   10/07/16 2313 10/08/16 1255 10/08/16 1700  Weight: 78.2 kg (172 lb 6.4 oz) 80.1 kg (176 lb 9.4 oz) 77.5 kg (170 lb 13.7 oz)    History of present illness:  46 y.o.malewith medical history significant for hypertension, chronic anemia, and end-stage renal disease on hemodialysis who presents from his dialysis center for evaluation of anaccess problem. Patient is typically dialyzed Mondays, Wednesdays, and Fridays through a graft in the proximal left lower extremity. The graft was felt to be clotted on 10/04/2016, dialysis cannot be performed, and he was sent for a declotting procedure the following day. Patient reports that after the declotting procedure, he completed a full dialysis session without incident; that was 10/05/2016. On the day of admission, he presented for his usual HD, but there were problems accessing the graft again, suspected secondary to clot  Hospital Course:  Vascular access problem - Pt had temporary cath placed - Nephrology on board who recommended the following:  Have discussed with IR and plan for [facility] today and will arrange HD afterwards here at [facility].    Can purseu further permanent access options as an outpatient.     Procedures:  Please refer to IR's notes for details  Consultations:  IR  Nephrology  Discharge  Exam: Vitals:   10/08/16 1623 10/08/16 1700  BP: (!) 130/98 (!) 145/94  Pulse: 64 67  Resp: 17 15  Temp:  97 F (36.1 C)    General: Pt in nad, alert and awake Cardiovascular: rrr, no rubs Respiratory: no increased wob, no wheezes  Discharge Instructions   Discharge Instructions    Call MD for:  extreme fatigue    Complete by:  As directed    Call MD for:  temperature >100.4    Complete by:  As directed    Diet - low sodium heart healthy    Complete by:  As directed    Discharge instructions    Complete by:  As directed    Please be sure to follow up with your Nephrologist so that plans can be made to find a different access for dialysis   Increase activity slowly    Complete by:  As directed      Current Discharge Medication List    CONTINUE these medications which have NOT CHANGED   Details  acetaminophen (TYLENOL) 500 MG tablet Take 1,000 mg by mouth daily as needed for headache (pain).     amLODipine (NORVASC) 10 MG tablet Take 10 mg by mouth at bedtime.     calcium elemental as carbonate (TUMS ULTRA 1000) 400 MG chewable tablet Chew 2,000-4,000 mg by mouth See admin instructions. Chew 4 tablets (1000 mg) by mouth 3 times daily with meals and 2 tablets (2000 mg) with snacks    multivitamin (RENA-VIT) TABS tablet Take 1 tablet by mouth daily.    cloNIDine (CATAPRES) 0.1 MG tablet Take one tab by  mouth daily for Blood pressure Qty: 15 tablet, Refills: 1       Allergies  Allergen Reactions  . Lisinopril Cough      The results of significant diagnostics from this hospitalization (including imaging, microbiology, ancillary and laboratory) are listed below for reference.    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 NO CURRENT ACCESS FOR DIALYSIS. EXAM: ULTRASOUND GUIDANCE FOR VASCULAR ACCESS RIGHT INTERNAL JUGULAR PERMANENT  HEMODIALYSIS CATHETER Date:  1/6/20181/03/2017 12:16 pm Radiologist:  M. Daryll Brod, MD Guidance:  Ultrasound and fluoroscopic FLUOROSCOPY TIME:  Fluoroscopy Time:  30 seconds (2 mGy). MEDICATIONS: 2 g Ancef administered within 1 hour of the procedure ANESTHESIA/SEDATION: Versed 4.0 mg IV; Fentanyl 150 mcg IV; Moderate Sedation Time:  19 minutes The patient was continuously monitored during the procedure by the interventional radiology nurse under my direct supervision. CONTRAST:  None. COMPLICATIONS: None immediate. PROCEDURE: Informed consent was obtained from the patient following explanation of the procedure, risks, benefits and alternatives. The patient understands, agrees and consents for the procedure. All questions were addressed. A time out was performed. Maximal barrier sterile technique utilized including caps, mask, sterile gowns, sterile gloves, large sterile drape, hand hygiene, and 2% chlorhexidine scrub. Under sterile conditions and local anesthesia, right internal jugular micropuncture venous access was performed with ultrasound. Images were obtained for documentation. A guide wire was inserted followed by a transitional dilator. Next, a 0.035 guidewire was advanced into the IVC with a 5-French catheter. Measurements were obtained from the right venotomy site to the proximal right atrium. In the right infraclavicular chest, a subcutaneous tunnel was created under sterile conditions and local anesthesia. 1% lidocaine with epinephrine was utilized for this. The 19 cm tip to cuff dialysis palindrome catheter was tunneled subcutaneously to the venotomy site and inserted into the SVC/RA junction through a valved peel-away sheath. Position was confirmed with fluoroscopy. Images were obtained for documentation. Blood was aspirated from the catheter followed by saline and heparin flushes. The appropriate volume and strength of heparin was instilled in each lumen. Caps were applied. The catheter was secured  at the tunnel site with Gelfoam and a pursestring suture. The venotomy site was closed with subcuticular Vicryl suture. Dermabond was applied to the small right neck incision. A dry sterile dressing was applied. The catheter is ready for use. No immediate complications. IMPRESSION: Ultrasound and fluoroscopically guided right internal jugular tunneled hemodialysis catheter (19 cm palindrome catheter). Electronically Signed   By: Jerilynn Mages.  Shick M.D.   On: 10/08/2016 12:22   Ir US Guide Vasc Access Left  Result Date: 10/05/2016 CLINICAL DATA:  END-STAGE RENAL DISEASE, OCCLUSION OF THE LEFT FEMORAL AV graft EXAM: ULTRASOUND GUIDANCE FOR VASCULAR ACCESS LEFT FEMORAL AV GRAFT THROMBECTOMY/THROMBOLYSIS VENOUS GRAFT ANGIOPLASTY Date:  1/3/20181/12/2016 11:00 am Radiologist:  M. Daryll Brod, MD Guidance:  Ultrasound and fluoroscopic MEDICATIONS: 1% lidocaine locally, 3000 units heparin, 2 mg tPA. ANESTHESIA/SEDATION: Moderate Sedation Time:  None.   patient does not have a driver. The patient was continuously monitored during the procedure by the interventional radiology nurse under my direct supervision. FLUOROSCOPY TIME:  Fluoroscopy Time: 10 minutes 6 seconds (90 mGy). CONTRAST:  20mL ISOVUE-300 IOPAMIDOL (ISOVUE-300) INJECTION 10% COMPLICATIONS: None immediate. PROCEDURE: Informed consent was obtained from the patient following explanation of the procedure, risks, benefits and alternatives. The patient understands, agrees and consents for the procedure. All questions were addressed. A time out was  performed. Maximal barrier sterile technique utilized including caps, mask, sterile gowns, sterile gloves, large sterile drape, hand hygiene, and betadine prep. Under sterile conditions and local anesthesia, the left femoral loop graft was accessed along the arterial and venous limbs with ultrasound. Over guide wires, a 7-French sheath was inserted into the arterial limb and a 6-French sheath was inserted into the venous limb.  Contrast injection confirms thrombosis of the graft. 2 mg of t-PA was instilled for thrombolysis. A catheter and guidewire were advanced across the venous anastomosis into the central iliac veins. Central iliac venogram confirms patency of the iliac veins and the IVC. 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 No central stenosis or large clot burden. Pullback venogram confirms thrombotic occlusion of the venous anastomosis to the common femoral vein. Over a guide wire, mechanical thrombectomy was performed with the Tretola thrombectomy device. Repeat injection demonstrates no significant residual clot burden within the graft. 3000 units of heparin was instilled into the graft. Venous angioplasty was performed of the venous anastomosis to the common femoral vein with a 7 mm balloon. This was done in an overlapping fashion. Angioplasty was also performed of the venous limb of the graft. To reestablished inflow, a 5.5 [place] Fogarty catheter was advanced across the arterial anastomosis over a glide wire. Fogarty embolectomy was performed of the arterial plug 3 times. This reestablished inflow to the graft. Both sheaths were back bled and syringe aspirated. Contrast injection confirms patency of the graft. Completion shuntogram was performed. Shuntogram: Following thrombectomy, thrombolysis and angioplasty, the left femoral loop graft has restored flow. The loop graft is patent. Venous anastomosis is patent to the common femoral vein. No significant residual intragraft stenosis or thrombus. 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 Additional overlapping 10 mm prolonged angioplasty performed of the left external iliac vein and left common femoral vein. Three overlapping inflations performed to approximately 15 atmospheres. Final outflow venogram demonstrates improvement but a persistent external iliac stenosis  remains. Post angioplasty result is similar to 07/15/2016. Therefore, the procedure was stopped. Sheaths were removed. Hemostasis obtained with pursestring sutures. No immediate complication. Patient tolerated the procedure well. IMPRESSION: Successful left femoral loop graft thrombectomy, thrombolysis and angioplasty to restore flow. Access is ready for use. Electronically Signed   By: Jerilynn Mages.  Shick M.D.   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 NO CURRENT ACCESS FOR DIALYSIS. EXAM: ULTRASOUND GUIDANCE FOR VASCULAR ACCESS RIGHT INTERNAL JUGULAR PERMANENT HEMODIALYSIS CATHETER Date:  1/6/20181/03/2017 12:16 pm Radiologist:  M. Daryll Brod, MD Guidance:  Ultrasound and fluoroscopic FLUOROSCOPY TIME:  Fluoroscopy Time:  30 seconds (2 mGy). MEDICATIONS: 2 g Ancef administered within 1 hour of the procedure ANESTHESIA/SEDATION: Versed 4.0 mg IV; Fentanyl 150 mcg IV; Moderate Sedation Time:  19 minutes The patient was continuously monitored during the procedure by the interventional radiology nurse under my direct supervision. CONTRAST:  None. COMPLICATIONS: None immediate. PROCEDURE: Informed consent was obtained from the patient following explanation of the procedure, risks, benefits and alternatives. The patient understands, agrees and consents for the procedure. All questions were addressed. A time out was performed. Maximal barrier sterile technique utilized including caps, mask, sterile gowns, sterile gloves, large sterile drape, hand hygiene, and 2% chlorhexidine scrub. Under sterile conditions and local anesthesia, right internal jugular micropuncture venous access was performed with  ultrasound. Images were obtained for documentation. A guide wire was inserted followed by a transitional dilator. Next, a 0.035 guidewire was advanced into the IVC with a 5-French catheter. Measurements were  obtained from the right venotomy site to the proximal right atrium. In the right infraclavicular chest, a subcutaneous tunnel was created under sterile conditions and local anesthesia. 1% lidocaine with epinephrine was utilized for this. The 19 cm tip to cuff dialysis palindrome catheter was tunneled subcutaneously to the venotomy site and inserted into the SVC/RA junction through a valved peel-away sheath. Position was confirmed with fluoroscopy. Images were obtained for documentation. Blood was aspirated from the catheter followed by saline and heparin flushes. The appropriate volume and strength of heparin was instilled in each lumen. Caps were applied. The catheter was secured at the tunnel site with Gelfoam and a pursestring suture. The venotomy site was closed with subcuticular Vicryl suture. Dermabond was applied to the small right neck incision. A dry sterile dressing was applied. The catheter is ready for use. No immediate complications. IMPRESSION: Ultrasound and fluoroscopically guided right internal jugular tunneled hemodialysis catheter (19 cm palindrome catheter). Electronically Signed   By: Jerilynn Mages.  Shick M.D.   On: 10/08/2016 12:22   Ir Thrombectomy Av Fistula W/thrombolysis/pta Inc/shunt/img Left  Result Date: 10/05/2016 CLINICAL DATA:  END-STAGE RENAL DISEASE, OCCLUSION OF THE LEFT FEMORAL AV graft EXAM: ULTRASOUND GUIDANCE FOR VASCULAR ACCESS LEFT FEMORAL AV GRAFT THROMBECTOMY/THROMBOLYSIS VENOUS GRAFT ANGIOPLASTY Date:  1/3/20181/12/2016 11:00 am Radiologist:  M. Daryll Brod, MD Guidance:  Ultrasound and fluoroscopic MEDICATIONS: 1% lidocaine locally, 3000 units heparin, 2 mg tPA. ANESTHESIA/SEDATION: Moderate Sedation Time:  None.   patient does not have a driver. The patient was continuously monitored during the procedure by the interventional radiology nurse under my direct supervision. FLUOROSCOPY TIME:  Fluoroscopy Time: 10 minutes 6 seconds (90 mGy). CONTRAST:  72mL ISOVUE-300 IOPAMIDOL  (ISOVUE-300) INJECTION 16% COMPLICATIONS: None immediate. PROCEDURE: Informed consent was obtained from the patient following explanation of the procedure, risks, benefits and alternatives. The patient understands, agrees and consents for the procedure. All questions were addressed. A time out was performed. Maximal barrier sterile technique utilized including caps, mask, sterile gowns, sterile gloves, large sterile drape, hand hygiene, and betadine prep. Under sterile conditions and local anesthesia, the left femoral loop graft was accessed along the arterial and venous limbs with ultrasound. Over guide wires, a 7-French sheath was inserted into the arterial limb and a 6-French sheath was inserted into the venous limb. Contrast injection confirms thrombosis of the graft. 2 mg of t-PA was instilled for thrombolysis. A catheter and guidewire were advanced across the venous anastomosis into the central iliac veins. Central iliac venogram confirms patency of the iliac veins and the IVC. 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 No central stenosis or large clot burden. Pullback venogram confirms thrombotic occlusion of the venous anastomosis to the common femoral vein. Over a guide wire, mechanical thrombectomy was performed with the Tretola thrombectomy device. Repeat injection demonstrates no significant residual clot burden within the graft. 3000 units of heparin was instilled into the graft. Venous angioplasty was performed of the venous anastomosis to the common femoral vein with a 7 mm balloon. This was done in an overlapping fashion. Angioplasty was also performed of the venous limb of the graft. To reestablished inflow, a 5.5 [place] Fogarty catheter was advanced across the arterial anastomosis over a glide wire. Fogarty embolectomy was performed of the  arterial plug 3 times. This reestablished inflow to the graft. Both sheaths  were back bled and syringe aspirated. Contrast injection confirms patency of the graft. Completion shuntogram was performed. Shuntogram: Following thrombectomy, thrombolysis and angioplasty, the left femoral loop graft has restored flow. The loop graft is patent. Venous anastomosis is patent to the common femoral vein. No significant residual intragraft stenosis or thrombus. 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 Additional overlapping 10 mm prolonged angioplasty performed of the left external iliac vein and left common femoral vein. Three overlapping inflations performed to approximately 15 atmospheres. Final outflow venogram demonstrates improvement but a persistent external iliac stenosis remains. Post angioplasty result is similar to 07/15/2016. Therefore, the procedure was stopped. Sheaths were removed. Hemostasis obtained with pursestring sutures. No immediate complication. Patient tolerated the procedure well. IMPRESSION: Successful left femoral loop graft thrombectomy, thrombolysis and angioplasty to restore flow. Access is ready for use. Electronically Signed   By: Jerilynn Mages.  Shick M.D.   On: 10/05/2016 11:29    Microbiology: Recent Results (from the past 240 hour(s))  MRSA PCR Screening     Status: None   Collection Time: 10/07/16 11:38 PM  Result Value Ref Range Status   MRSA by PCR NEGATIVE NEGATIVE Final    Comment:        The GeneXpert MRSA Assay (FDA approved for NASAL specimens only), is one component of a comprehensive MRSA colonization surveillance program. It is not intended to diagnose MRSA infection nor to guide or monitor treatment for MRSA infections.      Labs: Basic Metabolic Panel:  Recent Labs Lab 10/07/16 1755 10/07/16 2317 10/07/16 2359 10/08/16 0557  NA 142  --   --  140  K 5.2*  --  4.4 5.0  CL 100*  --   --  101  CO2 25  --   --  25  GLUCOSE 77 150*  --  68  BUN 68*  --   --  71*  CREATININE 15.07*  --   --  16.46*  CALCIUM 8.2*   --   --  7.7*   Liver Function Tests: No results for input(s): AST, ALT, ALKPHOS, BILITOT, PROT, ALBUMIN in the last 168 hours. No results for input(s): LIPASE, AMYLASE in the last 168 hours. No results for input(s): AMMONIA in the last 168 hours. CBC:  Recent Labs Lab 10/07/16 1755  WBC 5.7  HGB 11.5*  HCT 34.9*  MCV 92.3  PLT 312   Cardiac Enzymes: No results for input(s): CKTOTAL, CKMB, CKMBINDEX, TROPONINI in the last 168 hours. BNP: BNP (last 3 results) No results for input(s): BNP in the last 8760 hours.  ProBNP (last 3 results) No results for input(s): PROBNP in the last 8760 hours.  CBG:  Recent Labs Lab 10/07/16 2234 10/07/16 2316 10/08/16 0749  GLUCAP 147* 156* 67       Signed:  Velvet Bathe MD.  [facility] 10/08/2016, 5:38 PM

## 2016-10-08 NOTE — Procedures (Signed)
S/p RT IJ HD TUNNELED CATH  Tip svcra No comp Stable Ready for use Full report in PACS

## 2016-10-08 NOTE — Procedures (Signed)
Pt in observation status after presenting from outpatient HD unit with clotted L thigh AVG s/p declot and PTA 1/2.  Did work well on 1/3 but not on 1/5.  No B or T on exam.    Labs reviewed, stable.  Afebrile.    Have discussed with IR and plan for [facility] today and will arrange HD afterwards here at [facility].    Can purseu further permanent access options as an outpatient.

## 2016-10-08 NOTE — Procedures (Signed)
Patient was seen on dialysis and the procedure was supervised.  BFR 350  Via PC BP is  151/105.   Patient appears to be tolerating treatment well- going home after   [name] A 10/08/2016

## 2016-10-08 NOTE — Progress Notes (Signed)
Pt without complaints. No distress noted. Discharge instructions given. Verbalizes understanding. Discharged home via wheelchair.

## 2016-10-15 ENCOUNTER — Emergency Department (HOSPITAL_COMMUNITY)
Admission: EM | Admit: 2016-10-15 | Discharge: 2016-10-15 | Disposition: A | Discharge status: Home / Self Care | Payer: Medicare Other | Attending: Emergency Medicine | Admitting: Emergency Medicine

## 2016-10-15 DIAGNOSIS — I120 Hypertensive chronic kidney disease with stage 5 chronic kidney disease or end stage renal disease: Secondary | ICD-10-CM | POA: Insufficient documentation

## 2016-10-15 DIAGNOSIS — Z87891 Personal history of nicotine dependence: Secondary | ICD-10-CM | POA: Insufficient documentation

## 2016-10-15 DIAGNOSIS — Z992 Dependence on renal dialysis: Secondary | ICD-10-CM | POA: Insufficient documentation

## 2016-10-15 DIAGNOSIS — N186 End stage renal disease: Secondary | ICD-10-CM | POA: Insufficient documentation

## 2016-10-15 DIAGNOSIS — Y638 Failure in dosage during other surgical and medical care: Secondary | ICD-10-CM | POA: Insufficient documentation

## 2016-10-15 DIAGNOSIS — T82838A Hemorrhage of vascular prosthetic devices, implants and grafts, initial encounter: Secondary | ICD-10-CM | POA: Insufficient documentation

## 2016-10-15 LAB — CBC WITH DIFFERENTIAL/PLATELET
Basophils Absolute: 0 10*3/uL (ref 0.0–0.1)
Basophils Relative: 1 %
Eosinophils Absolute: 0.1 10*3/uL (ref 0.0–0.7)
Eosinophils Relative: 2 %
HCT: 30.5 % — ABNORMAL LOW (ref 39.0–52.0)
Hemoglobin: 10.1 g/dL — ABNORMAL LOW (ref 13.0–17.0)
Lymphocytes Relative: 26 %
Lymphs Abs: 1.3 10*3/uL (ref 0.7–4.0)
MCH: 30.3 pg (ref 26.0–34.0)
MCHC: 33.1 g/dL (ref 30.0–36.0)
MCV: 91.6 fL (ref 78.0–100.0)
Monocytes Absolute: 0.4 10*3/uL (ref 0.1–1.0)
Monocytes Relative: 7 %
Neutro Abs: 3.3 10*3/uL (ref 1.7–7.7)
Neutrophils Relative %: 64 %
Platelets: 253 10*3/uL (ref 150–400)
RBC: 3.33 MIL/uL — ABNORMAL LOW (ref 4.22–5.81)
RDW: 13.9 % (ref 11.5–15.5)
WBC: 5.1 10*3/uL (ref 4.0–10.5)

## 2016-10-15 LAB — BASIC METABOLIC PANEL
Anion gap: 13 (ref 5–15)
BUN: 51 mg/dL — ABNORMAL HIGH (ref 6–20)
CO2: 24 mmol/L (ref 22–32)
Calcium: 7.9 mg/dL — ABNORMAL LOW (ref 8.9–10.3)
Chloride: 103 mmol/L (ref 101–111)
Creatinine, Ser: 12.21 mg/dL — ABNORMAL HIGH (ref 0.61–1.24)
GFR calc Af Amer: 5 mL/min — ABNORMAL LOW (ref >60)
GFR calc non Af Amer: 4 mL/min — ABNORMAL LOW (ref >60)
Glucose, Bld: 87 mg/dL (ref 65–99)
Potassium: 4.7 mmol/L (ref 3.5–5.1)
Sodium: 140 mmol/L (ref 135–145)

## 2016-10-15 LAB — APTT: aPTT: 40 seconds — ABNORMAL HIGH (ref 24–36)

## 2016-10-15 LAB — PROTIME-INR
INR: 1.02
Prothrombin Time: 13.4 seconds (ref 11.4–15.2)

## 2016-10-15 NOTE — ED Provider Notes (Signed)
[facility] DEPT Provider Note   CSN: [id number] Arrival date & time: 10/15/16  0830     History   Chief Complaint Chief Complaint  Patient presents with  . Vascular Access Problem    HPI [name] is a 46 y.o. male.  HPI Patient presents to the emergency department with bleeding around his dialysis catheter.  The patient states that he has had several episodes of bleeding around his catheter that was placed a week ago.  The patient states that they are attempting to gain more permanent access for him.  Patient states that he had dialysis yesterday, with no complications.  Patient states that he was concerned because the area has bled several times. The patient denies chest pain, shortness of breath, headache,blurred vision, neck pain, fever, cough, weakness, numbness, dizziness, anorexia, edema, abdominal pain, nausea, vomiting, diarrhea, rash, back pain, dysuria, hematemesis, bloody stool, near syncope, or syncope. Past Medical History:  Diagnosis Date  . Anemia   . Cough    DRY    . Depression   . ESRD on hemodialysis ([place])    HD [facility] MWF  . Headache(784.0)   . Hypertension   . Muscle spasms of neck    BACK, NECK  . Thyroid disease     Patient Active Problem List   Diagnosis Date Noted  . Hyperkalemia   . Problem with vascular access 10/07/2016  . Hypertension 10/07/2016  . Hypertensive urgency 10/07/2016  . Normocytic anemia 10/07/2016  . Pseudoaneurysm of arteriovenous graft ([place]) 11/06/2015  . Cellulitis of left thigh 08/26/2015  . Abdominal pain 08/26/2015  . Complication from renal dialysis device   . ESRD (end stage renal disease) on dialysis ([place])   . ESRD (end stage renal disease) ([place])   . Sepsis ([place]) 12/14/2014  . Hypotension 12/14/2014  . Diarrhea   . Hypocalcemia   . Secondary hyperparathyroidism of renal origin ([place]) 04/24/2014  . Hyperparathyroidism, secondary ([place]) 03/21/2014    Past Surgical History:  Procedure  Laterality Date  . ARTERIOVENOUS GRAFT PLACEMENT Left    "forearm, it's not working; thigh"   . ARTERIOVENOUS GRAFT PLACEMENT Left 11/09/2015  . FALSE ANEURYSM REPAIR Left 11/09/2015   Procedure: REPAIR OF LEFT FEMORAL PSEUDOANEURYSM; REVISION  OF LEFT THIGH ARTERIOVENOUS GRAFT USING 6MM X 10 CM GORETEX GRAFT ;  Surgeon: [name], MD;  Location: [facility];  Service: Vascular;  Laterality: Left;  . IR GENERIC HISTORICAL  07/15/2016   IR US GUIDE VASC ACCESS LEFT 07/15/2016 [name], MD [facility]  . IR GENERIC HISTORICAL Left 07/15/2016   IR THROMBECTOMY AV FISTULA W/THROMBOLYSIS/PTA INC/SHUNT/IMG LEFT 07/15/2016 [name], MD [facility]  . IR GENERIC HISTORICAL  10/05/2016   IR US GUIDE VASC ACCESS LEFT 10/05/2016 [name], MD [facility]  . IR GENERIC HISTORICAL Left 10/05/2016   IR THROMBECTOMY AV FISTULA W/THROMBOLYSIS/PTA INC/SHUNT/IMG LEFT 10/05/2016 [name], MD [facility]  . IR GENERIC HISTORICAL  10/08/2016   IR FLUORO GUIDE CV LINE RIGHT 10/08/2016 [name], MD [facility]  . IR GENERIC HISTORICAL  10/08/2016   IR US GUIDE VASC ACCESS RIGHT 10/08/2016 [name], MD [facility]  . PARATHYROIDECTOMY N/A 04/24/2014   Procedure: TOTAL 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;  Surgeon: [name], MD;  Location: [facility];  Service: General;  Laterality: N/A;  NECK AND LEFT FOREARM  . PERIPHERAL VASCULAR CATHETERIZATION N/A 05/05/2016   Procedure: A/V Shuntogram;  Surgeon: Conrad Mount Carmel, MD;  Location: [facility];  Service: Cardiovascular;  Laterality: N/A;  . PERIPHERAL VASCULAR CATHETERIZATION Left 05/05/2016   Procedure: Peripheral Vascular Balloon Angioplasty;  Surgeon: Conrad [place], MD;  Location: [facility];  Service: Cardiovascular;  Laterality: Left;  . PSEUDOANEURYSM REPAIR Left 11/09/2015  . THROMBECTOMY / ARTERIOVENOUS GRAFT REVISION Left 08/18/2015   thigh  . THROMBECTOMY AND REVISION OF ARTERIOVENTOUS (AV) GORETEX  GRAFT Left 08/23/2015    Procedure: THROMBECTOMY AND REVISION OF ARTERIOVENTOUS (AV) GORETEX  GRAFT LEFT THIGH ;  Surgeon: [name], MD;  Location: [facility];  Service: Vascular;  Laterality: Left;  . THROMBECTOMY W/ EMBOLECTOMY Left 08/18/2015   Procedure: THROMBECTOMY  AND REVISION ARTERIOVENOUS GORE-TEX GRAFT/LEFT THIGH;  Surgeon: [name], MD;  Location: [facility];  Service: Vascular;  Laterality: Left;  Marland Kitchen VENOGRAM Left 05/05/2016   Procedure: Venogram;  Surgeon: Conrad [place], MD;  Location: [facility];  Service: Cardiovascular;  Laterality: Left;  lower extremity       Home Medications    Prior to Admission medications   Medication Sig Start Date End Date Taking? Authorizing Provider  amLODipine (NORVASC) 10 MG tablet Take 10 mg by mouth at bedtime. Hold on Mon Wed and Fri dialysis days   Yes Historical Provider, MD  calcium elemental as carbonate (TUMS ULTRA 1000) 400 MG chewable tablet Chew 2,000-4,000 mg by mouth See admin instructions. Chew 4 tablets (1000 mg) by mouth 3 times daily with meals and 2 tablets (2000 mg) with snacks   Yes Historical Provider, MD  carvedilol (COREG) 12.5 MG tablet Take 12.5 mg by mouth 2 (two) times daily with a meal. Hold in morning on Mon Wed Fri dialysis days   Yes Historical Provider, MD  multivitamin (RENA-VIT) TABS tablet Take 1 tablet by mouth daily.   Yes Historical Provider, MD  acetaminophen (TYLENOL) 500 MG tablet Take 1,000 mg by mouth daily as needed for headache (pain).     Historical Provider, MD  cloNIDine (CATAPRES) 0.1 MG tablet Take one tab by mouth daily for Blood pressure Patient not taking: Reported on 10/15/2016 08/31/16   Kandra Nicolas, MD    Family History No family history on file.  Social History Social History  Substance Use Topics  . Smoking status: Former Smoker    Quit date: 03/21/1986  . Smokeless tobacco: Never Used  . Alcohol use No     Allergies   Lisinopril   Review of Systems Review of Systems All other  systems negative except as documented in the HPI. All pertinent positives and negatives as reviewed in the HPI.  Physical Exam Updated Vital Signs BP 154/98   Pulse 66   Temp 98.1 F (36.7 C) (Oral)   Resp 14   Wt 81.4 kg   SpO2 100%   BMI 24.35 kg/m   Physical Exam  Constitutional: He is oriented to person, place, and time. He appears well-developed and well-nourished. No distress.  HENT:  Head: Normocephalic and atraumatic.  Mouth/Throat: Oropharynx is clear and moist.  Eyes: Pupils are equal, round, and reactive to light.  Neck: Normal range of motion. Neck supple.  Cardiovascular: Normal rate, regular rhythm and normal heart sounds.  Exam reveals no gallop and no friction rub.   No murmur heard. Pulmonary/Chest: Effort normal and breath sounds normal. No respiratory distress. He has no wheezes.    Neurological: He is alert and oriented to person, place, and time. He exhibits normal muscle tone. Coordination normal.  Skin: Skin is warm  and dry. No rash noted. No erythema.  Psychiatric: He has a normal mood and affect. His behavior is normal.  Nursing note and vitals reviewed.    ED Treatments / Results  Labs (all labs ordered are listed, but only abnormal results are displayed) Labs Reviewed  BASIC METABOLIC PANEL - Abnormal; Notable for the following:       Result Value   BUN 51 (*)    Creatinine, Ser 12.21 (*)    Calcium 7.9 (*)    GFR calc non Af Amer 4 (*)    GFR calc Af Amer 5 (*)    All other components within normal limits  CBC WITH DIFFERENTIAL/PLATELET - Abnormal; Notable for the following:    RBC 3.33 (*)    Hemoglobin 10.1 (*)    HCT 30.5 (*)    All other components within normal limits  APTT - Abnormal; Notable for the following:    aPTT 40 (*)    All other components within normal limits  PROTIME-INR    EKG  EKG Interpretation None       Radiology No results found.  Procedures Procedures (including critical care time)  Medications  Ordered in ED Medications - No data to display   Initial Impression / Assessment and Plan / ED Course  I have reviewed the triage vital signs and the nursing notes.  Pertinent labs & imaging results that were available during my care of the patient were reviewed by me and considered in my medical decision making (see chart for details).  Clinical Course     Patient has been observed here for multiple hours with no current bleeding around this area.  Place a pressure dressing over the area.  Told to follow up with his dialysis doctor as soon as possible.  Told to return here as needed  Final Clinical Impressions(s) / ED Diagnoses   Final diagnoses:  None    New Prescriptions New Prescriptions   No medications on file     Dalia Heading, PA-C 10/15/16 [place], MD 10/15/16 1601

## 2016-10-15 NOTE — ED Notes (Signed)
Pt's port covered with new dressing.  Advised patient to see PCP regarding elevated Blood pressure. Patient states that he takes meds for hypertension.

## 2016-10-15 NOTE — Discharge Instructions (Signed)
Return here as needed.  Follow up as soon as possible with your kidney doctor

## 2016-10-15 NOTE — ED Triage Notes (Addendum)
Pt reports bleeding around his dialysis access since Wednesday. Access was placed on Saturday. Pt was dialyzed yesterday. Pt has noted bleeding to dressing around access.  Pt BP elevated at triage. PT has not taken BP meds yet today.

## 2016-10-15 NOTE — ED Notes (Signed)
Gave patient crackers and water.

## 2016-10-31 ENCOUNTER — Encounter: Payer: Self-pay | Admitting: Vascular Surgery

## 2016-11-02 NOTE — Progress Notes (Signed)
Established Dialysis Access  History of Present Illness  Haldon Cache Bills is a 46 y.o. (05/09/1971) male who presents for re-evaluation of L thigh AVG.  The patient had a L thigh shuntogram with venoplasty of femoral vein x 2 (05/05/16).  This patient has previous undergone multiple revision of this thigh AVG.  He last had a PSA repaired by Dr. Donnetta Hutching on 11/09/15.  Recently this thigh AVG occluded and now he undergoes HD via a TDC.  He notes the pulsatile mass in his L groin has been growing over the last few months.  Past Medical History:  Diagnosis Date  . Anemia   . Cough    DRY    . Depression   . ESRD on hemodialysis ([place])    HD [facility] MWF  . Headache(784.0)   . Hypertension   . Muscle spasms of neck    BACK, NECK  . Thyroid disease     Past Surgical History:  Procedure Laterality Date  . ARTERIOVENOUS GRAFT PLACEMENT Left    "forearm, it's not working; thigh"   . ARTERIOVENOUS GRAFT PLACEMENT Left 11/09/2015  . FALSE ANEURYSM REPAIR Left 11/09/2015   Procedure: REPAIR OF LEFT FEMORAL PSEUDOANEURYSM; REVISION  OF LEFT THIGH ARTERIOVENOUS GRAFT USING 6MM X 10 CM GORETEX GRAFT ;  Surgeon: [name], MD;  Location: [facility];  Service: Vascular;  Laterality: Left;  . IR GENERIC HISTORICAL  07/15/2016   IR US GUIDE VASC ACCESS LEFT 07/15/2016 [name], MD [facility]  . IR GENERIC HISTORICAL Left 07/15/2016   IR THROMBECTOMY AV FISTULA W/THROMBOLYSIS/PTA INC/SHUNT/IMG LEFT 07/15/2016 [name], MD [facility]  . IR GENERIC HISTORICAL  10/05/2016   IR US GUIDE VASC ACCESS LEFT 10/05/2016 [name], MD [facility]  . IR GENERIC HISTORICAL Left 10/05/2016   IR THROMBECTOMY AV FISTULA W/THROMBOLYSIS/PTA INC/SHUNT/IMG LEFT 10/05/2016 [name], MD [facility]  . IR GENERIC HISTORICAL  10/08/2016   IR FLUORO GUIDE CV LINE RIGHT 10/08/2016 [name], MD [facility]  . IR GENERIC HISTORICAL  10/08/2016   IR US GUIDE VASC ACCESS RIGHT 10/08/2016 [name], MD  [facility]  . PARATHYROIDECTOMY N/A 04/24/2014   Procedure: TOTAL 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;  Surgeon: [name], MD;  Location: [facility];  Service: General;  Laterality: N/A;  NECK AND LEFT FOREARM  . PERIPHERAL VASCULAR CATHETERIZATION N/A 05/05/2016   Procedure: A/V Shuntogram;  Surgeon: Conrad Wanship, MD;  Location: [facility];  Service: Cardiovascular;  Laterality: N/A;  . PERIPHERAL VASCULAR CATHETERIZATION Left 05/05/2016   Procedure: Peripheral Vascular Balloon Angioplasty;  Surgeon: Conrad Deferiet, MD;  Location: [facility];  Service: Cardiovascular;  Laterality: Left;  . PSEUDOANEURYSM REPAIR Left 11/09/2015  . THROMBECTOMY / ARTERIOVENOUS GRAFT REVISION Left 08/18/2015   thigh  . THROMBECTOMY AND REVISION OF ARTERIOVENTOUS (AV) GORETEX  GRAFT Left 08/23/2015   Procedure: THROMBECTOMY AND REVISION OF ARTERIOVENTOUS (AV) GORETEX  GRAFT LEFT THIGH ;  Surgeon: [name], MD;  Location: [facility];  Service: Vascular;  Laterality: Left;  . THROMBECTOMY W/ EMBOLECTOMY Left 08/18/2015   Procedure: THROMBECTOMY  AND REVISION ARTERIOVENOUS GORE-TEX GRAFT/LEFT THIGH;  Surgeon: [name], MD;  Location: [facility];  Service: Vascular;  Laterality: Left;  Marland Kitchen VENOGRAM Left 05/05/2016   Procedure: Venogram;  Surgeon: Conrad , MD;  Location: [facility];  Service: Cardiovascular;  Laterality: Left;  lower extremity    Social History   Social  History  . Marital status: Married    Spouse name: N/A  . Number of children: N/A  . Years of education: N/A   Occupational History  . Not on file.   Social History Main Topics  . Smoking status: Former Smoker    Quit date: 03/21/1986  . Smokeless tobacco: Never Used  . Alcohol use No  . Drug use: No  . Sexual activity: Not on file   Other Topics Concern  . Not on file   Social History Narrative  . No narrative on file    Family History: patient is unable to detail the medical history of  his parents   Current Outpatient Prescriptions  Medication Sig Dispense Refill  . acetaminophen (TYLENOL) 500 MG tablet Take 1,000 mg by mouth daily as needed for headache (pain).     Marland Kitchen amLODipine (NORVASC) 10 MG tablet Take 10 mg by mouth at bedtime. Hold on Mon Wed and Fri dialysis days    . calcium elemental as carbonate (TUMS ULTRA 1000) 400 MG chewable tablet Chew 2,000-4,000 mg by mouth See admin instructions. Chew 4 tablets (1000 mg) by mouth 3 times daily with meals and 2 tablets (2000 mg) with snacks    . carvedilol (COREG) 12.5 MG tablet Take 12.5 mg by mouth 2 (two) times daily with a meal. Hold in morning on Mon Wed Fri dialysis days    . cloNIDine (CATAPRES) 0.1 MG tablet Take one tab by mouth daily for Blood pressure 15 tablet 1  . multivitamin (RENA-VIT) TABS tablet Take 1 tablet by mouth daily.     No current facility-administered medications for this visit.      Allergies  Allergen Reactions  . Lisinopril Cough     REVIEW OF SYSTEMS:  (Positives checked otherwise negative)  CARDIOVASCULAR:   [ ]  chest pain,  [ ]  chest pressure,  [ ]  palpitations,  [ ]  shortness of breath when laying flat,  [ ]  shortness of breath with exertion,   [ ]  pain in feet when walking,  [ ]  pain in feet when laying flat, [ ]  history of blood clot in veins (DVT),  [ ]  history of phlebitis,  [ ]  swelling in legs,  [ ]  varicose veins  PULMONARY:   [ ]  productive cough,  [ ]  asthma,  [ ]  wheezing  NEUROLOGIC:   [ ]  weakness in arms or legs,  [ ]  numbness in arms or legs,  [ ]  difficulty speaking or slurred speech,  [ ]  temporary loss of vision in one eye,  [ ]  dizziness  HEMATOLOGIC:   [ ]  bleeding problems,  [ ]  problems with blood clotting too easily  MUSCULOSKEL:   [ ]  joint pain, [ ]  joint swelling  GASTROINTEST:   [ ]  vomiting blood,  [ ]  blood in stool     GENITOURINARY:   [ ]  burning with urination,  [ ]  blood in urine  PSYCHIATRIC:   [ ]  history of major  depression  INTEGUMENTARY:   [ ]  rashes,  [ ]  ulcers  CONSTITUTIONAL:   [ ]  fever,  [ ]  chills   Physical Examination  Vitals:   11/04/16 1526  BP: (!) 138/101  Pulse: 81  Resp: 16  Temp: 97.5 F (36.4 C)  TempSrc: Oral  SpO2: 97%  Weight: 177 lb (80.3 kg)  Height: 6' (1.829 m)   Body mass index is 24.01 kg/m.  General: A&O x 3, WD, WN  Pulmonary: Sym exp, good air movt,  CTAB, no rales, rhonchi, & wheezing  Cardiac: RRR, Nl S1, S2, no Murmurs, rubs or gallops  Vascular:  L pulsatile mass overlying likely arterial arm of L thigh AVG  Gastrointestinal: soft, NTND, no G/R, bo HSM, no masses, no CVAT B  Musculoskeletal: M/S 5/5 throughout , Extremities without  ischemic changes   Neurologic: Pain and light touch intact in extremities , Motor exam as listed above   Medical Decision Making  Javiel Ronnell [place] is a 46 y.o. 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  I don't think I would attempt to salvage this thigh AVG given the multiple prior intervention.  I have some concern that this PSA could be related to chronic infection though there is no evidence of acute infection at this point. Risk, benefits, and alternatives to access surgery were discussed.   The patient is aware the risks include but are not limited to: bleeding, infection, nerve damage, need for additional procedures, death and stroke.   The patient agrees to proceed forward with the procedure.  I offered him a time next Tuesday but his schedule would not allow such. He will call us to schedule the procedure at a time convenient for himself.   Adele Barthel, MD, FACS [facility] Office: [phone] Pager: [phone]

## 2016-11-03 ENCOUNTER — Ambulatory Visit (HOSPITAL_COMMUNITY)
Admission: RE | Admit: 2016-11-03 | Discharge: 2016-11-03 | Disposition: A | Discharge status: Home / Self Care | Payer: Self-pay | Source: Ambulatory Visit | Attending: Vascular Surgery | Admitting: Vascular Surgery

## 2016-11-03 DIAGNOSIS — M7981 Nontraumatic hematoma of soft tissue: Secondary | ICD-10-CM | POA: Insufficient documentation

## 2016-11-03 DIAGNOSIS — T82868A Thrombosis of vascular prosthetic devices, implants and grafts, initial encounter: Secondary | ICD-10-CM | POA: Insufficient documentation

## 2016-11-03 DIAGNOSIS — Z4931 Encounter for adequacy testing for hemodialysis: Secondary | ICD-10-CM | POA: Insufficient documentation

## 2016-11-03 DIAGNOSIS — N186 End stage renal disease: Secondary | ICD-10-CM | POA: Insufficient documentation

## 2016-11-03 DIAGNOSIS — X58XXXA Exposure to other specified factors, initial encounter: Secondary | ICD-10-CM | POA: Insufficient documentation

## 2016-11-04 ENCOUNTER — Ambulatory Visit (INDEPENDENT_AMBULATORY_CARE_PROVIDER_SITE_OTHER): Payer: Self-pay | Admitting: Vascular Surgery

## 2016-11-04 ENCOUNTER — Encounter: Payer: Self-pay | Admitting: Vascular Surgery

## 2016-11-04 VITALS — BP 138/101 | HR 81 | Temp 97.5°F | Resp 16 | Ht 72.0 in | Wt 177.0 lb

## 2016-11-04 DIAGNOSIS — T82511D Breakdown (mechanical) of surgically created arteriovenous shunt, subsequent encounter: Secondary | ICD-10-CM

## 2016-11-04 DIAGNOSIS — T82898D Other specified complication of vascular prosthetic devices, implants and grafts, subsequent encounter: Secondary | ICD-10-CM

## 2016-12-07 ENCOUNTER — Other Ambulatory Visit: Payer: Self-pay | Admitting: *Deleted

## 2016-12-08 ENCOUNTER — Telehealth: Payer: Self-pay

## 2016-12-08 DIAGNOSIS — Z01818 Encounter for other preprocedural examination: Secondary | ICD-10-CM

## 2016-12-08 DIAGNOSIS — I724 Aneurysm of artery of lower extremity: Secondary | ICD-10-CM

## 2016-12-08 NOTE — Telephone Encounter (Signed)
rec'd verbal order from Dr. Scot Dock to schedule pt. for CTA pelvis, prior to planned Left Groin Exploration and repair of left femoral pseudoaneurysm on 3/15.  Have attempted to contact the pt. @ several phone numbers, as given per the kidney center; unable to contact pt.  Phone call to the [facility] today; advised trying to contact the pt.  Will call the kidney center AM of 12/09/16, when pt. is scheduled to be there for dialysis.

## 2016-12-13 ENCOUNTER — Inpatient Hospital Stay: Admission: RE | Admit: 2016-12-13 | Payer: Medicare Other | Source: Ambulatory Visit

## 2016-12-13 ENCOUNTER — Encounter (HOSPITAL_COMMUNITY): Payer: Self-pay | Admitting: *Deleted

## 2016-12-13 ENCOUNTER — Ambulatory Visit
Admission: RE | Admit: 2016-12-13 | Discharge: 2016-12-13 | Disposition: A | Discharge status: Home / Self Care | Payer: Medicare Other | Source: Ambulatory Visit | Attending: Vascular Surgery | Admitting: Vascular Surgery

## 2016-12-13 DIAGNOSIS — I724 Aneurysm of artery of lower extremity: Secondary | ICD-10-CM

## 2016-12-13 DIAGNOSIS — Z01818 Encounter for other preprocedural examination: Secondary | ICD-10-CM

## 2016-12-13 MED ORDER — IOPAMIDOL (ISOVUE-370) INJECTION 76%
75.0000 mL | Freq: Once | INTRAVENOUS | Status: AC | PRN
  Administered 2016-12-13: 75 mL via INTRAVENOUS

## 2016-12-13 MED ORDER — MINERAL OIL LIGHT 100 % EX OIL
TOPICAL_OIL | CUTANEOUS | Status: AC
  Filled 2016-12-13: qty 50

## 2016-12-13 NOTE — Telephone Encounter (Signed)
I have attempted in several ways to reach this pt and he has not responded, I did go ahead and make his CT appt and mail him a letter last week. The [facility] office was not very helpful outside of telling the pt to call us, they said they could not bring him a phone to speak to me when I called there.

## 2016-12-14 ENCOUNTER — Ambulatory Visit (HOSPITAL_COMMUNITY): Payer: Self-pay | Admitting: Anesthesiology

## 2016-12-14 ENCOUNTER — Other Ambulatory Visit: Payer: Self-pay | Admitting: Vascular Surgery

## 2016-12-14 DIAGNOSIS — Z01818 Encounter for other preprocedural examination: Secondary | ICD-10-CM

## 2016-12-14 DIAGNOSIS — I724 Aneurysm of artery of lower extremity: Secondary | ICD-10-CM

## 2016-12-15 ENCOUNTER — Telehealth: Payer: Self-pay | Admitting: Vascular Surgery

## 2016-12-15 ENCOUNTER — Ambulatory Visit (HOSPITAL_COMMUNITY)
Admission: RE | Admit: 2016-12-15 | Discharge: 2016-12-15 | Disposition: A | Discharge status: Home / Self Care | Payer: Self-pay | Source: Ambulatory Visit | Attending: Vascular Surgery | Admitting: Vascular Surgery

## 2016-12-15 ENCOUNTER — Encounter (HOSPITAL_COMMUNITY): Payer: Self-pay | Admitting: Anesthesiology

## 2016-12-15 ENCOUNTER — Other Ambulatory Visit: Payer: Self-pay | Admitting: *Deleted

## 2016-12-15 ENCOUNTER — Encounter (HOSPITAL_COMMUNITY)
Admission: RE | Disposition: A | Discharge status: Home / Self Care | Payer: Self-pay | Source: Ambulatory Visit | Attending: Vascular Surgery

## 2016-12-15 DIAGNOSIS — N186 End stage renal disease: Secondary | ICD-10-CM

## 2016-12-15 DIAGNOSIS — Z01810 Encounter for preprocedural cardiovascular examination: Secondary | ICD-10-CM

## 2016-12-15 HISTORY — DX: Anxiety disorder, unspecified: F41.9

## 2016-12-15 HISTORY — DX: Pneumonia, unspecified organism: J18.9

## 2016-12-15 SURGERY — REPAIR, PSEUDOANEURYSM
Anesthesia: General | Laterality: Left

## 2016-12-15 MED ORDER — CHLORHEXIDINE GLUCONATE CLOTH 2 % EX PADS: 6.0000 | MEDICATED_PAD | Freq: Once | CUTANEOUS | Status: DC

## 2016-12-15 MED ORDER — SODIUM CHLORIDE 0.9 % IV SOLN: INTRAVENOUS | Status: DC

## 2016-12-15 MED ORDER — DEXTROSE 5 % IV SOLN
1.5000 g | INTRAVENOUS | Status: DC
  Filled 2016-12-15: qty 1.5

## 2016-12-15 NOTE — Progress Notes (Signed)
   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 He has had a left forearm graft that failed and a failed Right arm fistula but not other upper extremity access that I can tell or he remembers. I have offered him upper extremity venography today to evaluate his central venous system to plan possible upper extremity access and I have also offered right thigh av graft but he required walker last time and is not prepared for this. We have agreed to get vein mapping as outpatient and then get bilateral upper extremity venography to plan possible upper arm access. If at the time of venography he does not have adequate venous runoff, then would plan right thigh graft. Patient understands and wishes to return home today. I will have office contact him.   Cledis Sohn C. Donzetta Matters, MD [facility] Office: [phone] Pager: [phone]

## 2016-12-15 NOTE — Telephone Encounter (Signed)
-----   Message from Mena Goes, RN sent at 12/15/2016  9:33 AM EDT ----- Regarding: Schedule vein mapping  Just schedule the vein mapping appt at the office and we will set him up for outpatient venography portion of this.  ----- Message ----- From: Waynetta Sandy, MD Sent: 12/15/2016   9:18 AM To: [facility] [address]  [name] [id number] Apr 12, 1971  Patient needs bilateral upper extremity vein mapping and to be set up as outpatient for bilateral upper extremity venograms to evaluate his central system. No office visit is needed prior to venography.

## 2016-12-15 NOTE — Anesthesia Preprocedure Evaluation (Deleted)
Anesthesia Evaluation  Patient identified by MRN, date of birth, ID band Patient awake    Reviewed: Allergy & Precautions, NPO status , Patient's Chart, lab work & pertinent test results  History of Anesthesia Complications (+) DIFFICULT AIRWAY  Airway Mallampati: III  TM Distance: <3 FB Neck ROM: Limited    Dental no notable dental hx.    Pulmonary neg pulmonary ROS, former smoker,    Pulmonary exam normal breath sounds clear to auscultation       Cardiovascular hypertension, Pt. on medications Normal cardiovascular exam Rhythm:Regular Rate:Normal     Neuro/Psych negative neurological ROS  negative psych ROS   GI/Hepatic negative GI ROS, Neg liver ROS,   Endo/Other  negative endocrine ROS  Renal/GU DialysisRenal disease  negative genitourinary   Musculoskeletal negative musculoskeletal ROS (+)   Abdominal   Peds negative pediatric ROS (+)  Hematology  (+) anemia ,   Anesthesia Other Findings   Reproductive/Obstetrics negative OB ROS                             Anesthesia Physical Anesthesia Plan  ASA: III  Anesthesia Plan: General   Post-op Pain Management:    Induction: Intravenous  Airway Management Planned: Oral ETT and Video Laryngoscope Planned  Additional Equipment:   Intra-op Plan:   Post-operative Plan: Extubation in OR  Informed Consent: I have reviewed the patients History and Physical, chart, labs and discussed the procedure including the risks, benefits and alternatives for the proposed anesthesia with the patient or authorized representative who has indicated his/her understanding and acceptance.   Dental advisory given  Plan Discussed with: CRNA and Surgeon  Anesthesia Plan Comments:         Anesthesia Quick Evaluation

## 2016-12-15 NOTE — Telephone Encounter (Signed)
Scheduled appointment for 3/16 @ 3pm. Pt is aware.

## 2016-12-16 ENCOUNTER — Ambulatory Visit (HOSPITAL_COMMUNITY)
Admission: RE | Admit: 2016-12-16 | Discharge: 2016-12-16 | Disposition: A | Discharge status: Home / Self Care | Payer: Self-pay | Source: Ambulatory Visit | Attending: Vascular Surgery | Admitting: Vascular Surgery

## 2016-12-16 DIAGNOSIS — N186 End stage renal disease: Secondary | ICD-10-CM | POA: Insufficient documentation

## 2016-12-16 DIAGNOSIS — Z01810 Encounter for preprocedural cardiovascular examination: Secondary | ICD-10-CM | POA: Insufficient documentation

## 2016-12-19 ENCOUNTER — Other Ambulatory Visit: Payer: Self-pay

## 2016-12-27 ENCOUNTER — Ambulatory Visit (HOSPITAL_COMMUNITY)
Admission: RE | Admit: 2016-12-27 | Discharge: 2016-12-27 | Disposition: A | Discharge status: Home / Self Care | Payer: Self-pay | Source: Ambulatory Visit | Attending: Surgery | Admitting: Surgery

## 2016-12-27 ENCOUNTER — Telehealth: Payer: Self-pay | Admitting: Vascular Surgery

## 2016-12-27 ENCOUNTER — Encounter (HOSPITAL_COMMUNITY)
Admission: RE | Disposition: A | Discharge status: Home / Self Care | Payer: Self-pay | Source: Ambulatory Visit | Attending: Surgery

## 2016-12-27 DIAGNOSIS — I871 Compression of vein: Secondary | ICD-10-CM | POA: Insufficient documentation

## 2016-12-27 DIAGNOSIS — N185 Chronic kidney disease, stage 5: Secondary | ICD-10-CM

## 2016-12-27 DIAGNOSIS — N186 End stage renal disease: Secondary | ICD-10-CM | POA: Insufficient documentation

## 2016-12-27 DIAGNOSIS — R112 Nausea with vomiting, unspecified: Secondary | ICD-10-CM | POA: Insufficient documentation

## 2016-12-27 HISTORY — PX: UPPER EXTREMITY VENOGRAPHY: CATH118272

## 2016-12-27 LAB — POCT I-STAT, CHEM 8
BUN: 50 mg/dL — ABNORMAL HIGH (ref 6–20)
CREATININE: 12.3 mg/dL — AB (ref 0.61–1.24)
Calcium, Ion: 0.76 mmol/L — CL (ref 1.15–1.40)
Chloride: 106 mmol/L (ref 101–111)
GLUCOSE: 80 mg/dL (ref 65–99)
HCT: 44 % (ref 39.0–52.0)
HEMOGLOBIN: 15 g/dL (ref 13.0–17.0)
POTASSIUM: 4.8 mmol/L (ref 3.5–5.1)
Sodium: 135 mmol/L (ref 135–145)
TCO2: 21 mmol/L (ref 0–100)

## 2016-12-27 SURGERY — UPPER EXTREMITY VENOGRAPHY
Anesthesia: LOCAL | Laterality: Bilateral

## 2016-12-27 MED ORDER — SODIUM CHLORIDE 0.9% FLUSH: 3.0000 mL | INTRAVENOUS | Status: DC | PRN

## 2016-12-27 MED ORDER — GUAIFENESIN-DM 100-10 MG/5ML PO SYRP: 15.0000 mL | ORAL_SOLUTION | ORAL | Status: DC | PRN

## 2016-12-27 MED ORDER — ALUM & MAG HYDROXIDE-SIMETH 200-200-20 MG/5ML PO SUSP: 15.0000 mL | ORAL | Status: DC | PRN

## 2016-12-27 MED ORDER — IODIXANOL 320 MG/ML IV SOLN
INTRAVENOUS | Status: DC | PRN
  Administered 2016-12-27: 120 mL via INTRAVENOUS

## 2016-12-27 MED ORDER — HYDRALAZINE HCL 20 MG/ML IJ SOLN: 5.0000 mg | INTRAMUSCULAR | Status: DC | PRN

## 2016-12-27 MED ORDER — PHENOL 1.4 % MT LIQD
1.0000 | OROMUCOSAL | Status: DC | PRN
Start: 2016-12-27 — End: 2016-12-27

## 2016-12-27 MED ORDER — FAMOTIDINE IN NACL 20-0.9 MG/50ML-% IV SOLN
20.0000 mg | Freq: Once | INTRAVENOUS | Status: AC
  Administered 2016-12-27: 20 mg via INTRAVENOUS

## 2016-12-27 MED ORDER — DOCUSATE SODIUM 100 MG PO CAPS: 100.0000 mg | ORAL_CAPSULE | Freq: Every day | ORAL | Status: DC

## 2016-12-27 MED ORDER — METOPROLOL TARTRATE 5 MG/5ML IV SOLN: 2.0000 mg | INTRAVENOUS | Status: DC | PRN

## 2016-12-27 MED ORDER — FAMOTIDINE IN NACL 20-0.9 MG/50ML-% IV SOLN
INTRAVENOUS | Status: AC
  Filled 2016-12-27: qty 50

## 2016-12-27 MED ORDER — ACETAMINOPHEN 325 MG RE SUPP: 325.0000 mg | RECTAL | Status: DC | PRN

## 2016-12-27 MED ORDER — DIPHENHYDRAMINE HCL 50 MG/ML IJ SOLN: 25.0000 mg | Freq: Once | INTRAMUSCULAR | Status: DC

## 2016-12-27 MED ORDER — ONDANSETRON HCL 4 MG/2ML IJ SOLN: 4.0000 mg | Freq: Four times a day (QID) | INTRAMUSCULAR | Status: DC | PRN

## 2016-12-27 MED ORDER — ACETAMINOPHEN 325 MG PO TABS: 325.0000 mg | ORAL_TABLET | ORAL | Status: DC | PRN

## 2016-12-27 MED ORDER — METHYLPREDNISOLONE SODIUM SUCC 125 MG IJ SOLR
INTRAMUSCULAR | Status: AC
  Filled 2016-12-27: qty 2

## 2016-12-27 MED ORDER — LABETALOL HCL 5 MG/ML IV SOLN: 10.0000 mg | INTRAVENOUS | Status: DC | PRN

## 2016-12-27 MED ORDER — DIPHENHYDRAMINE HCL 50 MG/ML IJ SOLN
INTRAMUSCULAR | Status: AC
  Filled 2016-12-27: qty 1

## 2016-12-27 MED ORDER — METHYLPREDNISOLONE SODIUM SUCC 125 MG IJ SOLR
125.0000 mg | Freq: Once | INTRAMUSCULAR | Status: AC
  Administered 2016-12-27: 125 mg via INTRAVENOUS

## 2016-12-27 SURGICAL SUPPLY — 2 items
STOPCOCK MORSE 400PSI 3WAY (MISCELLANEOUS) ×2 IMPLANT
TUBING CIL FLEX 10 FLL-RA (TUBING) ×2 IMPLANT

## 2016-12-27 NOTE — Op Note (Signed)
    Patient name: [name] MRN: [id number] DOB: 01/19/1971 Sex: male  12/27/2016 Pre-operative Diagnosis: ESRD Post-operative diagnosis:  Same Surgeon:  [name] Procedure Performed:  1.  Bilateral UE and central venogram    Indications:  The patient is here today for access planning  Procedure:  Peripheral IVs were placed in the holding area.  Contrast injections were performed through each IV  Findings:  Brachial veins are patent in the left and right upper extremity.    The right axillary and subclavian veins are patent throughout their course.  There is approximately a 60% stenosis within the subclavian vein.  The superior vena cava is patent into the right atrium.  The central venous system on the left side was very difficult to visualize since the IVs were in the hand.  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 [name], M.D. [facility] Office: [phone] Pager:  [phone]

## 2016-12-27 NOTE — H&P (View-Only) (Signed)
   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 He has had a left forearm graft that failed and a failed Right arm fistula but not other upper extremity access that I can tell or he remembers. I have offered him upper extremity venography today to evaluate his central venous system to plan possible upper extremity access and I have also offered right thigh av graft but he required walker last time and is not prepared for this. We have agreed to get vein mapping as outpatient and then get bilateral upper extremity venography to plan possible upper arm access. If at the time of venography he does not have adequate venous runoff, then would plan right thigh graft. Patient understands and wishes to return home today. I will have office contact him.   Analeigh Aries C. Donzetta Matters, MD [facility] Office: [phone] Pager: [phone]

## 2016-12-27 NOTE — Telephone Encounter (Signed)
-----   Message from Mena Goes, RN sent at 12/27/2016  2:02 PM EDT ----- Regarding:  asap with Dr. Donzetta Matters to discuss new access surgery   ----- Message ----- From: [name], MD Sent: 12/27/2016   1:56 PM To: [facility] Charge Pool  12/27/2016:  Surgeon:  [name] Procedure Performed:  1.  Bilateral UE and central venogram   Needs follow-up in the office for discussions of dialysis access.  He initially saw Dr. Donzetta Matters,

## 2016-12-27 NOTE — Discharge Instructions (Signed)
Venogram, Care After °This sheet gives you information about how to care for yourself after your procedure. Your health care provider may also give you more specific instructions. If you have problems or questions, contact your health care provider. °What can I expect after the procedure? °After the procedure, it is common to have: °· Bruising or mild discomfort in the area where the IV was inserted (insertion site). ° °Follow these instructions at home: °Eating and drinking °· Follow instructions from your health care provider about eating or drinking restrictions. °· Drink a lot of fluids for the first several days after the procedure, as directed by your health care provider. This helps to wash (flush) the contrast out of your body. Examples of healthy fluids include water or low-calorie drinks. °General instructions °· Check your IV insertion area every day for signs of infection. Check for: °? Redness, swelling, or pain. °? Fluid or blood. °? Warmth. °? Pus or a bad smell. °· Take over-the-counter and prescription medicines only as told by your health care provider. °· Rest and return to your normal activities as told by your health care provider. Ask your health care provider what activities are safe for you. °· Do not drive for 24 hours if you were given a medicine to help you relax (sedative), or until your health care provider approves. °· Keep all follow-up visits as told by your health care provider. This is important. °Contact a health care provider if: °· Your skin becomes itchy or you develop a rash or hives. °· You have a fever that does not get better with medicine. °· You feel nauseous. °· You vomit. °· You have redness, swelling, or pain around the insertion site. °· You have fluid or blood coming from the insertion site. °· Your insertion area feels warm to the touch. °· You have pus or a bad smell coming from the insertion site. °Get help right away if: °· You have difficulty breathing or  shortness of breath. °· You develop chest pain. °· You faint. °· You feel very dizzy. °These symptoms may represent a serious problem that is an emergency. Do not wait to see if the symptoms will go away. Get medical help right away. Call your local emergency services (911 in the [place] Do not drive yourself to the hospital. °Summary °· After your procedure, it is common to have bruising or mild discomfort in the area where the IV was inserted. °· You should check your IV insertion area every day for signs of infection. °· Take over-the-counter and prescription medicines only as told by your health care provider. °· You should drink a lot of fluids for the first several days after the procedure to help flush the contrast from your body. °This information is not intended to replace advice given to you by your health care provider. Make sure you discuss any questions you have with your health care provider. °Document Released: 07/10/2013 Document Revised: 08/13/2016 Document Reviewed: 08/13/2016 °Elsevier Interactive Patient Education © 2017 [employer] ° °

## 2016-12-27 NOTE — Telephone Encounter (Signed)
Pt agreed to come in 3/28 at 2:30 for appt

## 2016-12-27 NOTE — Progress Notes (Signed)
Solumedrol given IV push per order and a couple of minutes after I completed the medicine and flushed with saline, pt became nauseous and had emesis X3 of small amounts of stomach juices. At this time pt states he feels better and has not thrown up for the last 10 minutes. I will add this drug as an intolerance to allergy list.

## 2016-12-27 NOTE — Interval H&P Note (Signed)
History and Physical Interval Note:  12/27/2016 11:18 AM  [name]  has presented today for surgery, with the diagnosis of instage renal  The various methods of treatment have been discussed with the patient and family. After consideration of risks, benefits and other options for treatment, the patient has consented to  Procedure(s): Upper Extremity Venography (N/A) as a surgical intervention .  The patient's history has been reviewed, patient examined, no change in status, stable for surgery.  I have reviewed the patient's chart and labs.  Questions were answered to the patient's satisfaction.     [name]

## 2016-12-27 NOTE — Progress Notes (Signed)
Pt drove himself into hospital and does not have anyone that can come and pick him. Dr Trula Slade called and Steph from cath lab communicated to him (while I was on the phone and could hear) and informed him that he would not be able to have any sedation so he could drive and Dr Trula Slade approved. Pt states that he is going to his sister's post procedure.

## 2016-12-28 ENCOUNTER — Encounter (HOSPITAL_COMMUNITY): Payer: Self-pay | Admitting: Surgery

## 2016-12-28 ENCOUNTER — Encounter: Payer: Medicare Other | Admitting: Vascular Surgery

## 2017-01-03 ENCOUNTER — Other Ambulatory Visit: Payer: Self-pay

## 2017-01-03 ENCOUNTER — Encounter: Payer: Self-pay | Admitting: Vascular Surgery

## 2017-01-03 ENCOUNTER — Ambulatory Visit (INDEPENDENT_AMBULATORY_CARE_PROVIDER_SITE_OTHER): Payer: Self-pay | Admitting: Vascular Surgery

## 2017-01-03 VITALS — BP 122/92 | HR 80 | Temp 97.2°F | Resp 18 | Ht 72.0 in | Wt 183.0 lb

## 2017-01-03 DIAGNOSIS — Z992 Dependence on renal dialysis: Secondary | ICD-10-CM

## 2017-01-03 DIAGNOSIS — N186 End stage renal disease: Secondary | ICD-10-CM

## 2017-01-03 NOTE — Progress Notes (Signed)
[facility]  Patient name: [name] MRN: [id number] DOB: [birth year] Sex: male  REASON FOR VISIT: Discuss access for hemodialysis  HPI: [name] is a 46 y.o. male here for discussion of hemodialysis access. He has very complex history. He was scheduled to see Dr. Donzetta Matters for further discussion. He represents today with complaints of increasing swelling in his face and neck. He also reports dizziness. He underwent outpatient bilateral venogram with Dr. Trula Slade on 12/27/2016. 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 He does have a prior history of long use of a left femoral loop graft with pseudoaneurysm formation over time. He has never had a right femoral graft.  Past Medical History:  Diagnosis Date  . Anemia   . Anxiety   . Cough    DRY    . Depression   . ESRD on hemodialysis ([place])    HD [facility] MWF  . Headache(784.0)   . Hypertension   . Muscle spasms of neck    BACK, NECK  . Pneumonia 2015ish  . Thyroid disease     History reviewed. No pertinent family history.  SOCIAL HISTORY: Social History  Substance Use Topics  . Smoking status: Former Smoker    Quit date: 03/21/1986  . Smokeless tobacco: Never Used  . Alcohol use No    Allergies  Allergen Reactions  . Lisinopril Cough  . Ivp Dye [Iodinated Diagnostic Agents] Swelling  . Solu-Medrol [Methylprednisolone] Nausea And Vomiting    Current Outpatient Prescriptions  Medication Sig Dispense Refill  . acetaminophen (TYLENOL) 500 MG tablet Take 1,000 mg by mouth daily as needed for headache (pain).     Marland Kitchen amLODipine (NORVASC) 10 MG tablet Take 10 mg by mouth daily.     Marland Kitchen b complex-vitamin c-folic acid (NEPHRO-VITE) 0.8 MG TABS tablet Take 1 tablet by mouth daily.    . calcium elemental as carbonate (TUMS ULTRA 1000) 400 MG chewable tablet Chew 2,000-4,000 mg by mouth See  admin instructions. Chew 4 tablets (1000 mg) by mouth 3 times daily with meals and 2 tablets (2000 mg) with snacks    . carvedilol (COREG) 12.5 MG tablet Take 12.5 mg by mouth 2 (two) times daily with a meal.     . cloNIDine (CATAPRES) 0.1 MG tablet Take one tab by mouth daily for Blood pressure (Patient not taking: Reported on 12/13/2016) 15 tablet 1   No current facility-administered medications for this visit.     REVIEW OF SYSTEMS:  [X]  denotes positive finding, [ ]  denotes negative finding Cardiac  Comments:  Chest pain or chest pressure:    Shortness of breath upon exertion:    Short of breath when lying flat:    Irregular heart rhythm:        Vascular    Pain in calf, thigh, or hip brought on by ambulation:    Pain in feet at night that wakes you up from your sleep:     Blood clot in your veins:    Leg swelling:           PHYSICAL EXAM: Vitals:   01/03/17 0834  BP: (!) 122/92  Pulse: 80  Resp: 18  Temp: 97.2 F (36.2 C)  TempSrc: Oral  SpO2: 97%  Weight: 183 lb (83 kg)  Height: 6' (1.829 m)    GENERAL: The patient is a well-nourished male, in no acute distress. The vital signs are documented  above. CARDIOVASCULAR: 2+ right femoral pulse 2+ right popliteal pulse and 2+ right dorsalis pedis pulse PULMONARY: There is good air exchange  MUSCULOSKELETAL: There are no major deformities or cyanosis. NEUROLOGIC: No focal weakness or paresthesias are detected. SKIN: There are no ulcers or rashes noted. PSYCHIATRIC: The patient has a normal affect. I do not appreciate any particular swelling in his arms or face although the patient reports that this is more than his usual. DATA:  Reviewed his recent venogram from 12/27/2016 discussed with the patient.  MEDICAL ISSUES: Refill his only option would be right femoral loop graft. He has normal flow in his right foot sedation operative any risk for ischemia. Has never had any tissue loss to his right foot. Explained that  hopefully he will have improvement in his swelling in his face with removal of the catheter. He dialyzes on Monday Wednesday and Friday so will be scheduled for 23 hour admission with right femoral loop graft on 01/10/2017    [name], MD [facility] Tel [phone] Pager [phone]

## 2017-01-06 ENCOUNTER — Encounter (HOSPITAL_COMMUNITY): Payer: Self-pay | Admitting: *Deleted

## 2017-01-06 NOTE — Progress Notes (Signed)
Pt denies SOB, chest pain, and being under the care of a cardiologist. Pt denies having a cardiac cath and stress test. Pt denies having a chest x ray within the last year. Pt made aware to stop taking vitamins, fish oil and herbal medications. Do not take any NSAIDs ie: Ibuprofen, Advil, Naproxen, BC and Goody Powder. Pt verbalized understanding of all pre-op instructions.

## 2017-01-10 ENCOUNTER — Encounter (HOSPITAL_COMMUNITY): Payer: Self-pay | Admitting: *Deleted

## 2017-01-10 ENCOUNTER — Ambulatory Visit (HOSPITAL_COMMUNITY): Payer: Self-pay | Admitting: Certified Registered Nurse Anesthetist

## 2017-01-10 ENCOUNTER — Encounter (HOSPITAL_COMMUNITY)
Admission: RE | Disposition: A | Discharge status: Home / Self Care | Payer: Self-pay | Source: Ambulatory Visit | Attending: Vascular Surgery

## 2017-01-10 ENCOUNTER — Inpatient Hospital Stay (HOSPITAL_COMMUNITY)
Admission: RE | Admit: 2017-01-10 | Discharge: 2017-01-12 | DRG: 981 | Disposition: A | Discharge status: Home / Self Care | Payer: Self-pay | Source: Ambulatory Visit | Attending: Vascular Surgery | Admitting: Vascular Surgery

## 2017-01-10 DIAGNOSIS — Z992 Dependence on renal dialysis: Secondary | ICD-10-CM

## 2017-01-10 DIAGNOSIS — N185 Chronic kidney disease, stage 5: Secondary | ICD-10-CM

## 2017-01-10 DIAGNOSIS — N186 End stage renal disease: Secondary | ICD-10-CM

## 2017-01-10 DIAGNOSIS — Z79899 Other long term (current) drug therapy: Secondary | ICD-10-CM

## 2017-01-10 DIAGNOSIS — Z87891 Personal history of nicotine dependence: Secondary | ICD-10-CM

## 2017-01-10 DIAGNOSIS — Z888 Allergy status to other drugs, medicaments and biological substances status: Secondary | ICD-10-CM

## 2017-01-10 DIAGNOSIS — E079 Disorder of thyroid, unspecified: Secondary | ICD-10-CM | POA: Diagnosis present

## 2017-01-10 DIAGNOSIS — I120 Hypertensive chronic kidney disease with stage 5 chronic kidney disease or end stage renal disease: Principal | ICD-10-CM | POA: Diagnosis present

## 2017-01-10 DIAGNOSIS — Z91041 Radiographic dye allergy status: Secondary | ICD-10-CM

## 2017-01-10 HISTORY — PX: AV FISTULA PLACEMENT: SHX1204

## 2017-01-10 LAB — BASIC METABOLIC PANEL
Anion gap: 18 — ABNORMAL HIGH (ref 5–15)
BUN: 47 mg/dL — AB (ref 6–20)
CALCIUM: 9.2 mg/dL (ref 8.9–10.3)
CO2: 20 mmol/L — ABNORMAL LOW (ref 22–32)
Chloride: 99 mmol/L — ABNORMAL LOW (ref 101–111)
Creatinine, Ser: 13.39 mg/dL — ABNORMAL HIGH (ref 0.61–1.24)
GFR calc Af Amer: 5 mL/min — ABNORMAL LOW (ref >60)
GFR, EST NON AFRICAN AMERICAN: 4 mL/min — AB (ref >60)
GLUCOSE: 91 mg/dL (ref 65–99)
POTASSIUM: 4.9 mmol/L (ref 3.5–5.1)
Sodium: 137 mmol/L (ref 135–145)

## 2017-01-10 LAB — POCT I-STAT 4, (NA,K, GLUC, HGB,HCT)
GLUCOSE: 93 mg/dL (ref 65–99)
HCT: 42 % (ref 39.0–52.0)
HEMOGLOBIN: 14.3 g/dL (ref 13.0–17.0)
Potassium: 4 mmol/L (ref 3.5–5.1)
Sodium: 138 mmol/L (ref 135–145)

## 2017-01-10 LAB — CBC
HCT: 44.4 % (ref 39.0–52.0)
Hemoglobin: 14.3 g/dL (ref 13.0–17.0)
MCH: 31.3 pg (ref 26.0–34.0)
MCHC: 32.2 g/dL (ref 30.0–36.0)
MCV: 97.2 fL (ref 78.0–100.0)
PLATELETS: 178 10*3/uL (ref 150–400)
RBC: 4.57 MIL/uL (ref 4.22–5.81)
RDW: 16.5 % — ABNORMAL HIGH (ref 11.5–15.5)
WBC: 5.6 10*3/uL (ref 4.0–10.5)

## 2017-01-10 SURGERY — INSERTION OF ARTERIOVENOUS (AV) GORE-TEX GRAFT THIGH
Anesthesia: General | Site: Thigh | Laterality: Right

## 2017-01-10 MED ORDER — METOPROLOL TARTRATE 5 MG/5ML IV SOLN: 2.0000 mg | INTRAVENOUS | Status: DC | PRN

## 2017-01-10 MED ORDER — PHENYLEPHRINE 40 MCG/ML (10ML) SYRINGE FOR IV PUSH (FOR BLOOD PRESSURE SUPPORT)
PREFILLED_SYRINGE | INTRAVENOUS | Status: DC | PRN
  Administered 2017-01-10: 80 ug via INTRAVENOUS
  Administered 2017-01-10: 40 ug via INTRAVENOUS
  Administered 2017-01-10 (×2): 80 ug via INTRAVENOUS

## 2017-01-10 MED ORDER — MORPHINE SULFATE (PF) 2 MG/ML IV SOLN
2.0000 mg | INTRAVENOUS | Status: DC | PRN
  Administered 2017-01-10: 4 mg via INTRAVENOUS
  Filled 2017-01-10: qty 2

## 2017-01-10 MED ORDER — FENTANYL CITRATE (PF) 250 MCG/5ML IJ SOLN
INTRAMUSCULAR | Status: DC | PRN
  Administered 2017-01-10 (×4): 50 ug via INTRAVENOUS

## 2017-01-10 MED ORDER — PROTAMINE SULFATE 10 MG/ML IV SOLN
INTRAVENOUS | Status: DC | PRN
  Administered 2017-01-10: 50 mg via INTRAVENOUS

## 2017-01-10 MED ORDER — SODIUM CHLORIDE 0.9 % IV SOLN
INTRAVENOUS | Status: DC | PRN
  Administered 2017-01-10: 500 mL

## 2017-01-10 MED ORDER — SUCCINYLCHOLINE CHLORIDE 200 MG/10ML IV SOSY
PREFILLED_SYRINGE | INTRAVENOUS | Status: AC
  Filled 2017-01-10: qty 10

## 2017-01-10 MED ORDER — ONDANSETRON HCL 4 MG/2ML IJ SOLN
INTRAMUSCULAR | Status: DC | PRN
  Administered 2017-01-10: 4 mg via INTRAVENOUS

## 2017-01-10 MED ORDER — ONDANSETRON HCL 4 MG/2ML IJ SOLN
INTRAMUSCULAR | Status: AC
  Filled 2017-01-10: qty 2

## 2017-01-10 MED ORDER — DEXTROSE 5 % IV SOLN
1.5000 g | INTRAVENOUS | Status: AC
  Administered 2017-01-10: 1.5 g via INTRAVENOUS
  Filled 2017-01-10: qty 1.5

## 2017-01-10 MED ORDER — EPHEDRINE 5 MG/ML INJ
INTRAVENOUS | Status: AC
  Filled 2017-01-10: qty 10

## 2017-01-10 MED ORDER — HYDRALAZINE HCL 20 MG/ML IJ SOLN: 5.0000 mg | INTRAMUSCULAR | Status: DC | PRN

## 2017-01-10 MED ORDER — SODIUM CHLORIDE 0.9% FLUSH: 3.0000 mL | INTRAVENOUS | Status: DC | PRN

## 2017-01-10 MED ORDER — PANTOPRAZOLE SODIUM 40 MG PO TBEC
40.0000 mg | DELAYED_RELEASE_TABLET | Freq: Every day | ORAL | Status: DC
  Administered 2017-01-10 – 2017-01-11 (×2): 40 mg via ORAL
  Filled 2017-01-10 (×2): qty 1

## 2017-01-10 MED ORDER — FENTANYL CITRATE (PF) 100 MCG/2ML IJ SOLN
INTRAMUSCULAR | Status: AC
  Filled 2017-01-10: qty 2

## 2017-01-10 MED ORDER — MIDAZOLAM HCL 2 MG/2ML IJ SOLN
INTRAMUSCULAR | Status: AC
  Filled 2017-01-10: qty 2

## 2017-01-10 MED ORDER — SODIUM CHLORIDE 0.9 % IV SOLN: 250.0000 mL | INTRAVENOUS | Status: DC | PRN

## 2017-01-10 MED ORDER — PROPOFOL 10 MG/ML IV BOLUS
INTRAVENOUS | Status: DC | PRN
  Administered 2017-01-10: 200 mg via INTRAVENOUS

## 2017-01-10 MED ORDER — AMLODIPINE BESYLATE 10 MG PO TABS
10.0000 mg | ORAL_TABLET | Freq: Every day | ORAL | Status: DC
  Administered 2017-01-11: 10 mg via ORAL
  Filled 2017-01-10: qty 1

## 2017-01-10 MED ORDER — PHENOL 1.4 % MT LIQD: 1.0000 | OROMUCOSAL | Status: DC | PRN

## 2017-01-10 MED ORDER — GUAIFENESIN-DM 100-10 MG/5ML PO SYRP: 15.0000 mL | ORAL_SOLUTION | ORAL | Status: DC | PRN

## 2017-01-10 MED ORDER — SODIUM CHLORIDE 0.9 % IV SOLN
INTRAVENOUS | Status: DC
  Administered 2017-01-10 (×2): via INTRAVENOUS

## 2017-01-10 MED ORDER — MIDAZOLAM HCL 2 MG/2ML IJ SOLN
INTRAMUSCULAR | Status: DC | PRN
  Administered 2017-01-10: 2 mg via INTRAVENOUS

## 2017-01-10 MED ORDER — 0.9 % SODIUM CHLORIDE (POUR BTL) OPTIME
TOPICAL | Status: DC | PRN
  Administered 2017-01-10: 1000 mL

## 2017-01-10 MED ORDER — ACETAMINOPHEN 500 MG PO TABS
1000.0000 mg | ORAL_TABLET | Freq: Every day | ORAL | Status: DC | PRN
  Administered 2017-01-11 – 2017-01-12 (×2): 1000 mg via ORAL
  Filled 2017-01-10 (×2): qty 2

## 2017-01-10 MED ORDER — OXYCODONE-ACETAMINOPHEN 5-325 MG PO TABS
1.0000 | ORAL_TABLET | ORAL | Status: DC | PRN
  Administered 2017-01-10: 2 via ORAL
  Filled 2017-01-10: qty 2

## 2017-01-10 MED ORDER — LIDOCAINE 2% (20 MG/ML) 5 ML SYRINGE
INTRAMUSCULAR | Status: AC
  Filled 2017-01-10: qty 5

## 2017-01-10 MED ORDER — FENTANYL CITRATE (PF) 250 MCG/5ML IJ SOLN
INTRAMUSCULAR | Status: AC
  Filled 2017-01-10: qty 5

## 2017-01-10 MED ORDER — PROTAMINE SULFATE 10 MG/ML IV SOLN
INTRAVENOUS | Status: AC
  Filled 2017-01-10: qty 5

## 2017-01-10 MED ORDER — HEPARIN SODIUM (PORCINE) 1000 UNIT/ML IJ SOLN
INTRAMUSCULAR | Status: DC | PRN
  Administered 2017-01-10: 5000 [IU] via INTRAVENOUS

## 2017-01-10 MED ORDER — ROCURONIUM BROMIDE 50 MG/5ML IV SOSY
PREFILLED_SYRINGE | INTRAVENOUS | Status: AC
Start: 2017-01-10 — End: 2017-01-10
  Filled 2017-01-10: qty 5

## 2017-01-10 MED ORDER — POTASSIUM CHLORIDE CRYS ER 20 MEQ PO TBCR
20.0000 meq | EXTENDED_RELEASE_TABLET | Freq: Once | ORAL | Status: DC
Start: 2017-01-10 — End: 2017-01-10

## 2017-01-10 MED ORDER — PROMETHAZINE HCL 25 MG/ML IJ SOLN: 6.2500 mg | INTRAMUSCULAR | Status: DC | PRN

## 2017-01-10 MED ORDER — PHENYLEPHRINE 40 MCG/ML (10ML) SYRINGE FOR IV PUSH (FOR BLOOD PRESSURE SUPPORT)
PREFILLED_SYRINGE | INTRAVENOUS | Status: AC
  Filled 2017-01-10: qty 10

## 2017-01-10 MED ORDER — HEPARIN SODIUM (PORCINE) 1000 UNIT/ML IJ SOLN
INTRAMUSCULAR | Status: AC
  Filled 2017-01-10: qty 1

## 2017-01-10 MED ORDER — FENTANYL CITRATE (PF) 100 MCG/2ML IJ SOLN
25.0000 ug | INTRAMUSCULAR | Status: DC | PRN
  Administered 2017-01-10 (×2): 25 ug via INTRAVENOUS

## 2017-01-10 MED ORDER — RENA-VITE PO TABS
1.0000 | ORAL_TABLET | Freq: Every day | ORAL | Status: DC
  Administered 2017-01-10 – 2017-01-11 (×2): 1 via ORAL
  Filled 2017-01-10 (×2): qty 1

## 2017-01-10 MED ORDER — LIDOCAINE 2% (20 MG/ML) 5 ML SYRINGE
INTRAMUSCULAR | Status: DC | PRN
  Administered 2017-01-10: 100 mg via INTRAVENOUS

## 2017-01-10 MED ORDER — ONDANSETRON HCL 4 MG/2ML IJ SOLN: 4.0000 mg | Freq: Four times a day (QID) | INTRAMUSCULAR | Status: DC | PRN

## 2017-01-10 MED ORDER — CHLORHEXIDINE GLUCONATE CLOTH 2 % EX PADS: 6.0000 | MEDICATED_PAD | Freq: Once | CUTANEOUS | Status: DC

## 2017-01-10 MED ORDER — CARVEDILOL 12.5 MG PO TABS
12.5000 mg | ORAL_TABLET | Freq: Two times a day (BID) | ORAL | Status: DC
  Administered 2017-01-10 – 2017-01-11 (×2): 12.5 mg via ORAL
  Filled 2017-01-10 (×2): qty 1

## 2017-01-10 MED ORDER — ENOXAPARIN SODIUM 30 MG/0.3ML ~~LOC~~ SOLN: 30.0000 mg | SUBCUTANEOUS | Status: DC

## 2017-01-10 MED ORDER — SODIUM CHLORIDE 0.9% FLUSH
3.0000 mL | Freq: Two times a day (BID) | INTRAVENOUS | Status: DC
  Administered 2017-01-10 – 2017-01-11 (×3): 3 mL via INTRAVENOUS

## 2017-01-10 MED ORDER — LABETALOL HCL 5 MG/ML IV SOLN: 10.0000 mg | INTRAVENOUS | Status: DC | PRN

## 2017-01-10 MED ORDER — FENTANYL CITRATE (PF) 100 MCG/2ML IJ SOLN: 25.0000 ug | INTRAMUSCULAR | Status: DC | PRN

## 2017-01-10 MED ORDER — PROPOFOL 10 MG/ML IV BOLUS
INTRAVENOUS | Status: AC
  Filled 2017-01-10: qty 20

## 2017-01-10 MED ORDER — PHENYLEPHRINE HCL 10 MG/ML IJ SOLN
INTRAVENOUS | Status: DC | PRN
  Administered 2017-01-10: 30 ug/min via INTRAVENOUS

## 2017-01-10 SURGICAL SUPPLY — 45 items
ADH SKN CLS APL DERMABOND .7 (GAUZE/BANDAGES/DRESSINGS) ×1
AGENT HMST SPONGE THK3/8 (HEMOSTASIS)
CANISTER SUCT 3000ML PPV (MISCELLANEOUS) ×2 IMPLANT
CANNULA VESSEL 3MM 2 BLNT TIP (CANNULA) IMPLANT
CLIP TI MEDIUM 6 (CLIP) ×2 IMPLANT
CLIP TI WIDE RED SMALL 6 (CLIP) ×2 IMPLANT
DERMABOND ADVANCED (GAUZE/BANDAGES/DRESSINGS) ×1
DERMABOND ADVANCED .7 DNX12 (GAUZE/BANDAGES/DRESSINGS) ×1 IMPLANT
DRAPE INCISE IOBAN 66X45 STRL (DRAPES) ×3 IMPLANT
ELECT REM PT RETURN 9FT ADLT (ELECTROSURGICAL) ×2
ELECTRODE REM PT RTRN 9FT ADLT (ELECTROSURGICAL) ×1 IMPLANT
GAUZE SPONGE 4X4 12PLY STRL (GAUZE/BANDAGES/DRESSINGS) ×2 IMPLANT
GLOVE BIO SURGEON STRL SZ 6.5 (GLOVE) ×2 IMPLANT
GLOVE BIO SURGEON STRL SZ7.5 (GLOVE) ×2 IMPLANT
GLOVE BIOGEL PI IND STRL 6.5 (GLOVE) IMPLANT
GLOVE BIOGEL PI IND STRL 7.0 (GLOVE) IMPLANT
GLOVE BIOGEL PI IND STRL 7.5 (GLOVE) IMPLANT
GLOVE BIOGEL PI INDICATOR 6.5 (GLOVE) ×2
GLOVE BIOGEL PI INDICATOR 7.0 (GLOVE) ×1
GLOVE BIOGEL PI INDICATOR 7.5 (GLOVE) ×1
GLOVE ECLIPSE 7.0 STRL STRAW (GLOVE) ×1 IMPLANT
GLOVE SKINSENSE NS SZ7.0 (GLOVE) ×1
GLOVE SKINSENSE STRL SZ7.0 (GLOVE) IMPLANT
GOWN STRL REUS W/ TWL LRG LVL3 (GOWN DISPOSABLE) ×3 IMPLANT
GOWN STRL REUS W/ TWL XL LVL3 (GOWN DISPOSABLE) IMPLANT
GOWN STRL REUS W/TWL LRG LVL3 (GOWN DISPOSABLE) ×8
GOWN STRL REUS W/TWL XL LVL3 (GOWN DISPOSABLE) ×2
GRAFT GORETEX STRT 6X50 (Vascular Products) ×1 IMPLANT
HEMOSTAT SPONGE AVITENE ULTRA (HEMOSTASIS) IMPLANT
KIT BASIN OR (CUSTOM PROCEDURE TRAY) ×2 IMPLANT
KIT ROOM TURNOVER OR (KITS) ×2 IMPLANT
LOOP VESSEL MINI RED (MISCELLANEOUS) IMPLANT
NS IRRIG 1000ML POUR BTL (IV SOLUTION) ×2 IMPLANT
PACK CV ACCESS (CUSTOM PROCEDURE TRAY) ×2 IMPLANT
PAD ARMBOARD 7.5X6 YLW CONV (MISCELLANEOUS) ×4 IMPLANT
SUT PROLENE 6 0 CC (SUTURE) ×4 IMPLANT
SUT SILK 3 0 (SUTURE) ×2
SUT SILK 3-0 18XBRD TIE 12 (SUTURE) IMPLANT
SUT VIC AB 2-0 SH 27 (SUTURE) ×2
SUT VIC AB 2-0 SH 27XBRD (SUTURE) ×1 IMPLANT
SUT VIC AB 3-0 SH 27 (SUTURE) ×4
SUT VIC AB 3-0 SH 27X BRD (SUTURE) ×2 IMPLANT
SUT VICRYL 4-0 PS2 18IN ABS (SUTURE) ×4 IMPLANT
UNDERPAD 30X30 (UNDERPADS AND DIAPERS) ×2 IMPLANT
WATER STERILE IRR 1000ML POUR (IV SOLUTION) ×2 IMPLANT

## 2017-01-10 NOTE — Consult Note (Signed)
[name] is an 46 y.o. male referred by Dr Oneida Alar   Chief Complaint: ESRD, HTN, anemia HPI: 46yo M admitted post Rt thigh AVG.  Pt on HD MWF at [facility]. Last HD yest.  Pain controlled.  No new issues.  Says his sister could pick him up tomorrow night?  Past Medical History:  Diagnosis Date  . Anemia   . Anxiety   . Cough    DRY    . Depression   . ESRD on hemodialysis ([place])    HD [facility] MWF  . Headache(784.0)   . Hypertension   . Muscle spasms of neck    BACK, NECK  . Pneumonia 2015ish  . Thyroid disease     Past Surgical History:  Procedure Laterality Date  . ARTERIOVENOUS GRAFT PLACEMENT Left    "forearm, it's not working; thigh"   . ARTERIOVENOUS GRAFT PLACEMENT Left 11/09/2015  . FALSE ANEURYSM REPAIR Left 11/09/2015   Procedure: REPAIR OF LEFT FEMORAL PSEUDOANEURYSM; REVISION  OF LEFT THIGH ARTERIOVENOUS GRAFT USING 6MM X 10 CM GORETEX GRAFT ;  Surgeon: [name], MD;  Location: [facility];  Service: Vascular;  Laterality: Left;  . IR GENERIC HISTORICAL  07/15/2016   IR US GUIDE VASC ACCESS LEFT 07/15/2016 [name], MD [facility]  . IR GENERIC HISTORICAL Left 07/15/2016   IR THROMBECTOMY AV FISTULA W/THROMBOLYSIS/PTA INC/SHUNT/IMG LEFT 07/15/2016 [name], MD [facility]  . IR GENERIC HISTORICAL  10/05/2016   IR US GUIDE VASC ACCESS LEFT 10/05/2016 [name], MD [facility]  . IR GENERIC HISTORICAL Left 10/05/2016   IR THROMBECTOMY AV FISTULA W/THROMBOLYSIS/PTA INC/SHUNT/IMG LEFT 10/05/2016 [name], MD [facility]  . IR GENERIC HISTORICAL  10/08/2016   IR FLUORO GUIDE CV LINE RIGHT 10/08/2016 [name], MD [facility]  . IR GENERIC HISTORICAL  10/08/2016   IR US GUIDE VASC ACCESS RIGHT 10/08/2016 [name], MD [facility]  . PARATHYROIDECTOMY N/A 04/24/2014   Procedure: TOTAL 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;  Surgeon: [name], MD;  Location: [facility];  Service: General;  Laterality: N/A;  NECK AND LEFT FOREARM  .  PERIPHERAL VASCULAR CATHETERIZATION N/A 05/05/2016   Procedure: A/V Shuntogram;  Surgeon: Conrad Winchester, MD;  Location: [place] [facility];  Service: Cardiovascular;  Laterality: N/A;  . PERIPHERAL VASCULAR CATHETERIZATION Left 05/05/2016   Procedure: Peripheral Vascular Balloon Angioplasty;  Surgeon: Conrad Forrest, MD;  Location: [place] [facility];  Service: Cardiovascular;  Laterality: Left;  . PSEUDOANEURYSM REPAIR Left 11/09/2015  . THROMBECTOMY / ARTERIOVENOUS GRAFT REVISION Left 08/18/2015   thigh  . THROMBECTOMY AND REVISION OF ARTERIOVENTOUS (AV) GORETEX  GRAFT Left 08/23/2015   Procedure: THROMBECTOMY AND REVISION OF ARTERIOVENTOUS (AV) GORETEX  GRAFT LEFT THIGH ;  Surgeon: [name], MD;  Location: [place];  Service: Vascular;  Laterality: Left;  . THROMBECTOMY W/ EMBOLECTOMY Left 08/18/2015   Procedure: THROMBECTOMY  AND REVISION ARTERIOVENOUS GORE-TEX GRAFT/LEFT THIGH;  Surgeon: [name], MD;  Location: [place];  Service: Vascular;  Laterality: Left;  . UPPER EXTREMITY VENOGRAPHY Bilateral 12/27/2016   Procedure: Upper Extremity Venography;  Surgeon: [name], MD;  Location: [place] [facility];  Service: Cardiovascular;  Laterality: Bilateral;  . VENOGRAM Left 05/05/2016   Procedure: Venogram;  Surgeon: Conrad , MD;  Location: [place] [facility];  Service: Cardiovascular;  Laterality: Left;  lower extremity    History reviewed. No pertinent family history. Social History:  reports that  he quit smoking about 30 years ago. He has never used smokeless tobacco. He reports that he does not drink alcohol or use drugs. Lives with his sister  Allergies:  Allergies  Allergen Reactions  . Lisinopril Cough  . Ivp Dye [Iodinated Diagnostic Agents] Swelling    SWELLING REACTION UNSPECIFIED   . Solu-Medrol [Methylprednisolone] Nausea And Vomiting    REACTION NOT AN ALLERGY     Medications Prior to Admission  Medication Sig Dispense Refill  . acetaminophen (TYLENOL)  500 MG tablet Take 1,000 mg by mouth daily as needed for headache (pain).     Marland Kitchen amLODipine (NORVASC) 10 MG tablet Take 10 mg by mouth daily.     Marland Kitchen b complex-vitamin c-folic acid (NEPHRO-VITE) 0.8 MG TABS tablet Take 1 tablet by mouth daily.    . calcium elemental as carbonate (TUMS ULTRA 1000) 400 MG chewable tablet Chew 2,000-4,000 mg by mouth See admin instructions. Chew 4 tablets (1000 mg) by mouth 3 times daily with meals and 2 tablets (2000 mg) with snacks    . carvedilol (COREG) 12.5 MG tablet Take 12.5 mg by mouth 2 (two) times daily with a meal.        Lab Results: UA: ND  Recent Labs  01/10/17 0638  HGB 14.3  HCT 42.0   BMET  Recent Labs  01/10/17 0638  NA 138  K 4.0  GLUCOSE 93   LFT No results for input(s): PROT, ALBUMIN, AST, ALT, ALKPHOS, BILITOT, BILIDIR, IBILI in the last 72 hours. No results found.  ROS: No change in vision No SOB No CP No Abd pain No edema Mild pain Rt thigh from AVG Mild facial swelling due to perm cath No dysuria No new neuro CO  PHYSICAL EXAM: Blood pressure 95/63, pulse 74, temperature 97.6 F (36.4 C), temperature source Oral, resp. rate 18, SpO2 100 %. HEENT: PERRLA EOMI  Mild swelling face NECK:Rt IJ perm cath LUNGS:clear CARDIAC:RRR wo MRG ABD:+ BS NTND No HSM EXT:No edema  Rt thigh AVG + bruit NEURO:CNI Ox3 no asterixis  [facility]  MWF, 3.45hr,160, BFR 400/800, DW 79kg, 2K2.5Ca, profile 4, sodium modeling linear, 1500u heparin  Assessment: 1. SP Rt thigh AVG 2. HTN 3. ESRD MWF PLAN: 1. HD tomorrow 2. Meds reviewed.  Hold BP meds for SBP < 110. 3. Renal diet   [name] T 01/10/2017, 11:10 AM

## 2017-01-10 NOTE — Transfer of Care (Signed)
Immediate Anesthesia Transfer of Care Note  Patient: [name]  Procedure(s) Performed: Procedure(s): INSERTION OF ARTERIOVENOUS Right thigh GORE-TEX GRAFT (Right)  Patient Location: PACU  Anesthesia Type:General  Level of Consciousness: patient cooperative, drowsy  Airway & Oxygen Therapy: Patient Spontanous Breathing and Patient connected to nasal cannula oxygen  Post-op Assessment: Report given to RN, Post -op Vital signs reviewed and stable and Patient moving all extremities X 4  Post vital signs: Reviewed and stable  Last Vitals:  Vitals:   01/10/17 0617  BP: (!) 122/93  Pulse: 98  Resp: 18  Temp: 36.8 C    Last Pain: There were no vitals filed for this visit.    Patients Stated Pain Goal: 3 (25/95/63 8756)  Complications: No apparent anesthesia complications

## 2017-01-10 NOTE — Interval H&P Note (Signed)
History and Physical Interval Note:  01/10/2017 7:31 AM  [name]  has presented today for surgery, with the diagnosis of End stage renal disease N18.6  The various methods of treatment have been discussed with the patient and family. After consideration of risks, benefits and other options for treatment, the patient has consented to  Procedure(s): INSERTION OF ARTERIOVENOUS (AV) GORE-TEX GRAFT THIGH (Right) as a surgical intervention .  The patient's history has been reviewed, patient examined, no change in status, stable for surgery.  I have reviewed the patient's chart and labs.  Questions were answered to the patient's satisfaction.     Ruta Hinds

## 2017-01-10 NOTE — Anesthesia Procedure Notes (Signed)
Procedure Name: LMA Insertion Date/Time: 01/10/2017 7:49 AM Performed by: Julieta Bellini Pre-anesthesia Checklist: Patient identified, Emergency Drugs available, Suction available and Patient being monitored Patient Re-evaluated:Patient Re-evaluated prior to inductionOxygen Delivery Method: Circle system utilized Preoxygenation: Pre-oxygenation with 100% oxygen Intubation Type: IV induction Ventilation: Mask ventilation without difficulty LMA: LMA inserted LMA Size: 5.0 Tube type: Oral Number of attempts: 1 Placement Confirmation: positive ETCO2 and breath sounds checked- equal and bilateral Tube secured with: Tape Dental Injury: Teeth and Oropharynx as per pre-operative assessment

## 2017-01-10 NOTE — Op Note (Signed)
Procedure: Right Thigh AV graft  Preoperative diagnosis: End-stage renal disease  Postoperative diagnosis: Same  Anesthesia: Gen.  Assistant: Gerri Lins, PA-C  Operative findings: 6 mm PTFE graft to common femoral artery end to side and saphenofemoral junction end to side  Operative details: After obtaining informed consent, the patient was taken to the operating room. The patient was placed in supine position operating table. After induction of general anesthesia and endotracheal intubation the patient's entire right anterior thigh and groin were prepped and draped in usual sterile fashion. Next an oblique incision was made in the right groin crease and carried down through the subcutaneous tissues to the level of the saphenous femoral junction. Greater saphenous vein was dissected free for several centimeters and several small side branches ligated and divided between silk ties. The anterior surface of the right common femoral vein was also dissected free in preparation for clamping. Next, the left common femoral artery was dissected free circumferentially. There was a low femoral bifurcation so I selected the common femoral for inflow. Vessel loops were placed proximal and distal to the planned site of arteriotomy. Next a subcutaneous tunnel was created in a loop configuration on the anterior aspect of the right thigh. The lateral aspect of the graft was the arterial limb the medial aspect was the venous limb. A transverse incision was made in the distal thigh for assistance in tunneling. The patient was then given 5000 units of intravenous heparin. Vessel loops were used to control the common femoral artery proximally and distally. The graft was beveled on the arterial end and sewn end of graft to side of artery using running 6-0 Prolene suture. Just prior to completion of the anastomosis it was forebled backbled and thoroughly flushed.   The anastomosis was secured; vesseloops were released;  and there was pulsatile flow in the graft immediately. There was also still pulsatile flow in the superficial femoral artery below this. Attention was then turned to the venous anastomosis. The graft was pulled taut to length. The distal saphenous vein was ligated with a 2 0 silk tie.  The common femoral and was controlled proximally with a cooley clamp. The saphenous vein was transected and opened longitudinally from the proximal sapenous into the common femoral vein.   The graft was cut to length and beveled and sewn end to end to the saphenofemoral junction with the tip of the graft extending out onto the common femoral vein. This was done with a running 6-0 Prolene suture. Just prior completion anastomosis this was forebled backbled and thoroughly flushed.   The anastomosis was secured,  clamps released, and there was a palpable thrill in the vein immediately. Hemostasis was obtained with 50 mg protamine. Subcutaneous tissues of both incisions were closed with a running 3-0 Vicryl suture. The skin of both incisions was closed with 4 0 Vicryl subcuticular stitch. Dermabond was applied to both incisions. Patient tolerated the procedure well and there were no complications. Instrument, sponge, and needle counts were correct at the end of the case. The patient was taken to the recovery room in stable condition.  Ruta Hinds, MD [facility] [place] Office: [phone] Pager: [phone]

## 2017-01-10 NOTE — H&P (View-Only) (Signed)
Vascular and Vein Specialist of [facility]  Patient name: [name] MRN: [id number] DOB: 1971-07-22 Sex: male  REASON FOR VISIT: Discuss access for hemodialysis  HPI: [name] is a 46 y.o. male here for discussion of hemodialysis access. He has very complex history. He was scheduled to see Dr. Donzetta Matters for further discussion. He represents today with complaints of increasing swelling in his face and neck. He also reports dizziness. He underwent outpatient bilateral venogram with Dr. Trula Slade on 12/27/2016. 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 He does have a prior history of long use of a left femoral loop graft with pseudoaneurysm formation over time. He has never had a right femoral graft.  Past Medical History:  Diagnosis Date  . Anemia   . Anxiety   . Cough    DRY    . Depression   . ESRD on hemodialysis ([place])    HD [facility] MWF  . Headache(784.0)   . Hypertension   . Muscle spasms of neck    BACK, NECK  . Pneumonia 2015ish  . Thyroid disease     History reviewed. No pertinent family history.  SOCIAL HISTORY: Social History  Substance Use Topics  . Smoking status: Former Smoker    Quit date: 03/21/1986  . Smokeless tobacco: Never Used  . Alcohol use No    Allergies  Allergen Reactions  . Lisinopril Cough  . Ivp Dye [Iodinated Diagnostic Agents] Swelling  . Solu-Medrol [Methylprednisolone] Nausea And Vomiting    Current Outpatient Prescriptions  Medication Sig Dispense Refill  . acetaminophen (TYLENOL) 500 MG tablet Take 1,000 mg by mouth daily as needed for headache (pain).     Marland Kitchen amLODipine (NORVASC) 10 MG tablet Take 10 mg by mouth daily.     Marland Kitchen b complex-vitamin c-folic acid (NEPHRO-VITE) 0.8 MG TABS tablet Take 1 tablet by mouth daily.    . calcium elemental as carbonate (TUMS ULTRA 1000) 400 MG chewable tablet Chew 2,000-4,000 mg by mouth See  admin instructions. Chew 4 tablets (1000 mg) by mouth 3 times daily with meals and 2 tablets (2000 mg) with snacks    . carvedilol (COREG) 12.5 MG tablet Take 12.5 mg by mouth 2 (two) times daily with a meal.     . cloNIDine (CATAPRES) 0.1 MG tablet Take one tab by mouth daily for Blood pressure (Patient not taking: Reported on 12/13/2016) 15 tablet 1   No current facility-administered medications for this visit.     REVIEW OF SYSTEMS:  [X]  denotes positive finding, [ ]  denotes negative finding Cardiac  Comments:  Chest pain or chest pressure:    Shortness of breath upon exertion:    Short of breath when lying flat:    Irregular heart rhythm:        Vascular    Pain in calf, thigh, or hip brought on by ambulation:    Pain in feet at night that wakes you up from your sleep:     Blood clot in your veins:    Leg swelling:           PHYSICAL EXAM: Vitals:   01/03/17 0834  BP: (!) 122/92  Pulse: 80  Resp: 18  Temp: 97.2 F (36.2 C)  TempSrc: Oral  SpO2: 97%  Weight: 183 lb (83 kg)  Height: 6' (1.829 m)    GENERAL: The patient is a well-nourished male, in no acute distress. The vital signs are documented  above. CARDIOVASCULAR: 2+ right femoral pulse 2+ right popliteal pulse and 2+ right dorsalis pedis pulse PULMONARY: There is good air exchange  MUSCULOSKELETAL: There are no major deformities or cyanosis. NEUROLOGIC: No focal weakness or paresthesias are detected. SKIN: There are no ulcers or rashes noted. PSYCHIATRIC: The patient has a normal affect. I do not appreciate any particular swelling in his arms or face although the patient reports that this is more than his usual. DATA:  Reviewed his recent venogram from 12/27/2016 discussed with the patient.  MEDICAL ISSUES: Refill his only option would be right femoral loop graft. He has normal flow in his right foot sedation operative any risk for ischemia. Has never had any tissue loss to his right foot. Explained that  hopefully he will have improvement in his swelling in his face with removal of the catheter. He dialyzes on Monday Wednesday and Friday so will be scheduled for 23 hour admission with right femoral loop graft on 01/10/2017    [name], MD [facility] Vascular and Vein Specialists of [facility] Tel [phone] Pager [phone]

## 2017-01-10 NOTE — Anesthesia Preprocedure Evaluation (Addendum)
Anesthesia Evaluation  Patient identified by MRN, date of birth, ID band Patient awake    Reviewed: Allergy & Precautions, NPO status , Patient's Chart, lab work & pertinent test results  Airway Mallampati: II  TM Distance: >3 FB     Dental   Pulmonary pneumonia, former smoker,    breath sounds clear to auscultation       Cardiovascular hypertension,  Rhythm:Regular Rate:Normal     Neuro/Psych  Headaches,    GI/Hepatic negative GI ROS, Neg liver ROS,   Endo/Other    Renal/GU Renal disease     Musculoskeletal   Abdominal   Peds  Hematology  (+) anemia ,   Anesthesia Other Findings   Reproductive/Obstetrics                             Anesthesia Physical Anesthesia Plan  ASA: III  Anesthesia Plan: General   Post-op Pain Management:    Induction: Intravenous  Airway Management Planned: Simple Face Mask  Additional Equipment:   Intra-op Plan:   Post-operative Plan: Extubation in OR  Informed Consent: I have reviewed the patients History and Physical, chart, labs and discussed the procedure including the risks, benefits and alternatives for the proposed anesthesia with the patient or authorized representative who has indicated his/her understanding and acceptance.   Dental advisory given  Plan Discussed with: CRNA and Anesthesiologist  Anesthesia Plan Comments:         Anesthesia Quick Evaluation

## 2017-01-10 NOTE — Anesthesia Postprocedure Evaluation (Signed)
Anesthesia Post Note  Patient: [name]  Procedure(s) Performed: Procedure(s) (LRB): INSERTION OF ARTERIOVENOUS Right thigh GORE-TEX GRAFT (Right)  Patient location during evaluation: PACU Anesthesia Type: General Level of consciousness: awake Pain management: pain level controlled Vital Signs Assessment: post-procedure vital signs reviewed and stable Respiratory status: spontaneous breathing Cardiovascular status: stable Anesthetic complications: no       Last Vitals:  Vitals:   01/10/17 0617  BP: (!) 122/93  Pulse: 98  Resp: 18  Temp: 36.8 C    Last Pain: There were no vitals filed for this visit.               Hillman Attig

## 2017-01-10 NOTE — Anesthesia Postprocedure Evaluation (Signed)
Anesthesia Post Note  Patient: [name]  Procedure(s) Performed: Procedure(s) (LRB): INSERTION OF ARTERIOVENOUS Right thigh GORE-TEX GRAFT (Right)  Patient location during evaluation: PACU Anesthesia Type: General Level of consciousness: awake Pain management: pain level controlled Vital Signs Assessment: post-procedure vital signs reviewed and stable Respiratory status: spontaneous breathing Cardiovascular status: stable Anesthetic complications: no       Last Vitals:  Vitals:   01/10/17 1030 01/10/17 1104  BP: 100/74 95/63  Pulse: 72 74  Resp: 14 18  Temp:  36.4 C    Last Pain:  Vitals:   01/10/17 1104  TempSrc: Oral  PainSc: 4                  Lennie Dunnigan

## 2017-01-11 ENCOUNTER — Encounter (HOSPITAL_COMMUNITY): Payer: Self-pay | Admitting: Vascular Surgery

## 2017-01-11 ENCOUNTER — Telehealth: Payer: Self-pay | Admitting: Vascular Surgery

## 2017-01-11 LAB — HIV ANTIBODY (ROUTINE TESTING W REFLEX): HIV SCREEN 4TH GENERATION: NONREACTIVE

## 2017-01-11 MED ORDER — OXYCODONE-ACETAMINOPHEN 5-325 MG PO TABS: 1.0000 | ORAL_TABLET | ORAL | 0 refills | Status: DC | PRN

## 2017-01-11 NOTE — Telephone Encounter (Signed)
-----   Message from Mena Goes, RN sent at 01/11/2017  9:44 AM EDT ----- Regarding: 2-3 weeks   ----- Message ----- From: Ulyses Amor, PA-C Sent: 01/11/2017   7:38 AM To: Vvs Charge Pool  S/P right thigh graft.  F/U with Dr. Oneida Alar in 2-3 weeks

## 2017-01-11 NOTE — Progress Notes (Addendum)
Vascular and Vein Specialists of [place]  Subjective  - Doing well over all.  Plan to discharge today after work.   Objective 116/70 90 98.2 F (36.8 C) (Oral) 18 97%  Intake/Output Summary (Last 24 hours) at 01/11/17 0724 Last data filed at 01/10/17 0935  Gross per 24 hour  Intake              500 ml  Output               20 ml  Net              480 ml    Palpable DP 2+ B Right groin soft without hematoma Heart RRR Lungs non labored breathing  Assessment/Planning: POD # right thigh graft  HD today, rolling walker for ambulation F/U in 2-3 weeks with Dr. Carvel Getting, EMMA [facility] 01/11/2017 7:24 AM -- Agree with above. Bruit in graft Dc home Fu 2-3 weeks  Ruta Hinds, MD Vascular and Vein Specialists of [place]: [phone] Pager: [phone]  Laboratory Lab Results:  Recent Labs  01/10/17 0638 01/10/17 1122  WBC  --  5.6  HGB 14.3 14.3  HCT 42.0 44.4  PLT  --  178   BMET  Recent Labs  01/10/17 0638 01/10/17 1122  NA 138 137  K 4.0 4.9  CL  --  99*  CO2  --  20*  GLUCOSE 93 91  BUN  --  47*  CREATININE  --  13.39*  CALCIUM  --  9.2    COAG Lab Results  Component Value Date   INR 1.02 10/15/2016   INR 0.98 07/15/2016   INR 1.05 07/27/2015   No results found for: PTT

## 2017-01-11 NOTE — Telephone Encounter (Signed)
Sched appt 02/09/17 at 10:30. Vm full, mailed letter through regular mail to inform pt.

## 2017-01-11 NOTE — Progress Notes (Addendum)
    [name] had surgery yesterday 01/10/2017  at [facility] and was admitted.  He will be discharged late today after HD.  Hi wife was caring for him.  He will be able to return to work on Tuesday 01/17/2017.      COLLINS, EMMA MAUREEN PA-C

## 2017-01-11 NOTE — Care Management Note (Signed)
Case Management Note Marvetta Gibbons RN, BSN Unit 2W-Case Manager [phone]  Patient Details  Name: [name] MRN: [id number] Date of Birth: [birth year]  Subjective/Objective:   Admitted s/p right thigh graft                Action/Plan: PTA pt lived at home with wife- for d/c home after HD today- order for RW placed- notified Jermaine with [facility] for DME need- RW to be delivered to room prior to discharge.   Expected Discharge Date:  01/11/17               Expected Discharge Plan:  Home/Self Care  In-House Referral:     Discharge planning Services  CM Consult  Post Acute Care Choice:  Durable Medical Equipment Choice offered to:  Patient  DME Arranged:  Gilford Rile rolling DME Agency:  [place]:    [place]:     Status of Service:  Completed, signed off  If discussed at [place] of Stay Meetings, dates discussed:    Additional Comments:  Dawayne Patricia, RN 01/11/2017, 12:27 PM

## 2017-01-11 NOTE — Progress Notes (Signed)
S:Pain controlled.  Says he has been up walking O:BP 116/70 (BP Location: Right Arm)   Pulse 90   Temp 98.2 F (36.8 C) (Oral)   Resp 18   Ht 5\' 11"  (1.803 m)   Wt 83 kg (182 lb 15.7 oz)   SpO2 97%   BMI 25.52 kg/m   Intake/Output Summary (Last 24 hours) at 01/11/17 0649 Last data filed at 01/10/17 0935  Gross per 24 hour  Intake              500 ml  Output               20 ml  Net              480 ml   Weight change:  QMG:NOIBB and alert CVS: RRR Resp:Clear Abd:+ BS NTNBD Ext:No edema  Rt thigh AVG NEURO:CNI Ox3 no asterixis Rt IJ perm Cath   . amLODipine  10 mg Oral Daily  . carvedilol  12.5 mg Oral BID WC  . enoxaparin (LOVENOX) injection  30 mg Subcutaneous Q24H  . multivitamin  1 tablet Oral QHS  . pantoprazole  40 mg Oral Daily  . sodium chloride flush  3 mL Intravenous Q12H   No results found. BMET    Component Value Date/Time   NA 137 01/10/2017 1122   K 4.9 01/10/2017 1122   CL 99 (L) 01/10/2017 1122   CO2 20 (L) 01/10/2017 1122   GLUCOSE 91 01/10/2017 1122   BUN 47 (H) 01/10/2017 1122   CREATININE 13.39 (H) 01/10/2017 1122   CALCIUM 9.2 01/10/2017 1122   CALCIUM 8.7 02/16/2010 0827   GFRNONAA 4 (L) 01/10/2017 1122   GFRAA 5 (L) 01/10/2017 1122   CBC    Component Value Date/Time   WBC 5.6 01/10/2017 1122   RBC 4.57 01/10/2017 1122   HGB 14.3 01/10/2017 1122   HCT 44.4 01/10/2017 1122   PLT 178 01/10/2017 1122   MCV 97.2 01/10/2017 1122   MCH 31.3 01/10/2017 1122   MCHC 32.2 01/10/2017 1122   RDW 16.5 (H) 01/10/2017 1122   LYMPHSABS 1.3 10/15/2016 1212   MONOABS 0.4 10/15/2016 1212   EOSABS 0.1 10/15/2016 1212   BASOSABS 0.0 10/15/2016 1212     Assessment: 1. SP Rt thigh AVG 2. ESRD MWF 3. HTN  Plan: 1. HD today.  Hopefully he will be able to go after HD.  He says he will have a ride   [name]

## 2017-01-12 NOTE — Progress Notes (Signed)
Discharged this a.m. D/C instructions were explained to patient and he verbalized understanding. IV taken out intact.

## 2017-01-12 NOTE — Progress Notes (Signed)
S:Plans for DC today O:BP (!) 88/53 (BP Location: Right Arm)   Pulse (!) 104   Temp 98.7 F (37.1 C) (Oral)   Resp 18   Ht 5\' 11"  (1.803 m)   Wt 80.9 kg (178 lb 4.8 oz)   SpO2 98%   BMI 24.87 kg/m   Intake/Output Summary (Last 24 hours) at 01/12/17 0655 Last data filed at 01/11/17 1958  Gross per 24 hour  Intake                0 ml  Output             2500 ml  Net            -2500 ml   Weight change: 0.6 kg (1 lb 5.2 oz) AVW:PVXYI and alert CVS: RRR Resp:Clear Abd:+ BS NTNBD Ext:No edema  Rt thigh AVG NEURO:CNI Ox3 no asterixis Rt IJ perm Cath   . amLODipine  10 mg Oral Daily  . carvedilol  12.5 mg Oral BID WC  . enoxaparin (LOVENOX) injection  30 mg Subcutaneous Q24H  . multivitamin  1 tablet Oral QHS  . pantoprazole  40 mg Oral Daily  . sodium chloride flush  3 mL Intravenous Q12H   No results found. BMET    Component Value Date/Time   NA 137 01/10/2017 1122   K 4.9 01/10/2017 1122   CL 99 (L) 01/10/2017 1122   CO2 20 (L) 01/10/2017 1122   GLUCOSE 91 01/10/2017 1122   BUN 47 (H) 01/10/2017 1122   CREATININE 13.39 (H) 01/10/2017 1122   CALCIUM 9.2 01/10/2017 1122   CALCIUM 8.7 02/16/2010 0827   GFRNONAA 4 (L) 01/10/2017 1122   GFRAA 5 (L) 01/10/2017 1122   CBC    Component Value Date/Time   WBC 5.6 01/10/2017 1122   RBC 4.57 01/10/2017 1122   HGB 14.3 01/10/2017 1122   HCT 44.4 01/10/2017 1122   PLT 178 01/10/2017 1122   MCV 97.2 01/10/2017 1122   MCH 31.3 01/10/2017 1122   MCHC 32.2 01/10/2017 1122   RDW 16.5 (H) 01/10/2017 1122   LYMPHSABS 1.3 10/15/2016 1212   MONOABS 0.4 10/15/2016 1212   EOSABS 0.1 10/15/2016 1212   BASOSABS 0.0 10/15/2016 1212     Assessment: 1. SP Rt thigh AVG 2. ESRD MWF 3. HTN  Plan: 1. DC today 2. He will go to outpt unit tomorrow   [name] T

## 2017-01-12 NOTE — Progress Notes (Addendum)
Vascular and Vein Specialists of [place]  Subjective  - Doing well over all.  Ready to go home today.  There was a mix up with his transportation.   Objective (!) 92/56 (!) 102 98.1 F (36.7 C) (Oral) 18 100%  Intake/Output Summary (Last 24 hours) at 01/12/17 0725 Last data filed at 01/11/17 1958  Gross per 24 hour  Intake                0 ml  Output             2500 ml  Net            -2500 ml    Right thigh tender, tolerable.  Assessment/Planning: POD # 2 right thigh graft placement.  F/U in our office in 2-3 weeks with Dr. Carvel Getting, EMMA [facility] 01/12/2017 7:25 AM -- Agree  Ruta Hinds, MD Vascular and Vein Specialists of [place]: [phone] Pager: [phone]  Laboratory Lab Results:  Recent Labs  01/10/17 0638 01/10/17 1122  WBC  --  5.6  HGB 14.3 14.3  HCT 42.0 44.4  PLT  --  178   BMET  Recent Labs  01/10/17 0638 01/10/17 1122  NA 138 137  K 4.0 4.9  CL  --  99*  CO2  --  20*  GLUCOSE 93 91  BUN  --  47*  CREATININE  --  13.39*  CALCIUM  --  9.2    COAG Lab Results  Component Value Date   INR 1.02 10/15/2016   INR 0.98 07/15/2016   INR 1.05 07/27/2015   No results found for: PTT

## 2017-01-13 NOTE — Discharge Summary (Signed)
Vascular and Vein Specialists Discharge Summary   Patient ID:  Janson Lamar MRN: [id number] DOB/AGE: 1970/10/05 46 y.o.  Admit date: 01/10/2017 Discharge date: 01/12/17 Date of Surgery: 01/10/2017 Surgeon: Surgeon(s): [name], MD  Admission Diagnosis: End stage renal disease N18.6  Discharge Diagnoses:  End stage renal disease N18.6  Secondary Diagnoses: Past Medical History:  Diagnosis Date  . Anemia   . Anxiety   . Cough    DRY    . Depression   . ESRD on hemodialysis ([place])    HD [facility] MWF  . Headache(784.0)   . Hypertension   . Muscle spasms of neck    BACK, NECK  . Pneumonia 2015ish  . Thyroid disease     Procedure(s): INSERTION OF ARTERIOVENOUS Right thigh GORE-TEX GRAFT  Discharged Condition: good  HPI: Kyen Ronnell [place] Knippenberg is a 46 y.o. male here for discussion of hemodialysis access. He has very complex history. He was scheduled to see Dr. Donzetta Matters for further discussion. He represents today with complaints of increasing swelling in his face and neck. He also reports dizziness. He underwent outpatient bilateral venogram with Dr. Trula Slade on 12/27/2016. 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 He does have a prior history of long use of a left femoral loop graft with pseudoaneurysm formation over time. He has never had a right femoral graft.   Hospital Course:  Jovann Luse is a 46 y.o. male is S/P Right Procedure(s): INSERTION OF ARTERIOVENOUS Right thigh GORE-TEX GRAFT   Post op stable condition with pain issues.  Patient requested a rolling walker for home and a work not to stay out of work for 1 week.  He states his previous thigh graft on the left took him at least a week to recover.  Nephrology was consult due to transportation reasons he stayed 2 nights until his ride home was available.  He was discharged home in stable condition with palpable DP pulses B, tolerating PO's,  and ambulating with a rolling walker independently. Do not stick graft for 4 weeks.  F/U with Dr. Oneida Alar in 2-3 weeks for incisional check.  Treatment Team:  Fleet Contras, MD  Significant Diagnostic Studies: CBC Lab Results  Component Value Date   WBC 5.6 01/10/2017   HGB 14.3 01/10/2017   HCT 44.4 01/10/2017   MCV 97.2 01/10/2017   PLT 178 01/10/2017    BMET    Component Value Date/Time   NA 137 01/10/2017 1122   K 4.9 01/10/2017 1122   CL 99 (L) 01/10/2017 1122   CO2 20 (L) 01/10/2017 1122   GLUCOSE 91 01/10/2017 1122   BUN 47 (H) 01/10/2017 1122   CREATININE 13.39 (H) 01/10/2017 1122   CALCIUM 9.2 01/10/2017 1122   CALCIUM 8.7 02/16/2010 0827   GFRNONAA 4 (L) 01/10/2017 1122   GFRAA 5 (L) 01/10/2017 1122   COAG Lab Results  Component Value Date   INR 1.02 10/15/2016   INR 0.98 07/15/2016   INR 1.05 07/27/2015     Disposition:  Discharge to :Home Discharge Instructions    Call MD for:  redness, tenderness, or signs of infection (pain, swelling, bleeding, redness, odor or green/yellow discharge around incision site)    Complete by:  As directed    Call MD for:  severe or increased pain, loss or decreased feeling  in affected limb(s)    Complete by:  As directed    Call MD for:  temperature >100.5  Complete by:  As directed    Discharge instructions    Complete by:  As directed    You may wash you right leg in 24 hours   Driving Restrictions    Complete by:  As directed    No driving for 1 week   Lifting restrictions    Complete by:  As directed    No heavy lifting for 1 week   Resume previous diet    Complete by:  As directed      Allergies as of 01/12/2017      Reactions   Lisinopril Cough   Ivp Dye [iodinated Diagnostic Agents] Swelling   SWELLING REACTION UNSPECIFIED    Solu-medrol [methylprednisolone] Nausea And Vomiting   REACTION NOT AN ALLERGY       Medication List    TAKE these medications   acetaminophen 500 MG  tablet Commonly known as:  TYLENOL Take 1,000 mg by mouth daily as needed for headache (pain).   amLODipine 10 MG tablet Commonly known as:  NORVASC Take 10 mg by mouth daily.   b complex-vitamin c-folic acid 0.8 MG Tabs tablet Take 1 tablet by mouth daily.   carvedilol 12.5 MG tablet Commonly known as:  COREG Take 12.5 mg by mouth 2 (two) times daily with a meal.   oxyCODONE-acetaminophen 5-325 MG tablet Commonly known as:  PERCOCET/ROXICET Take 1 tablet by mouth every 4 (four) hours as needed for moderate pain.   TUMS ULTRA 1000 400 MG chewable tablet Generic drug:  calcium elemental as carbonate Chew 2,000-4,000 mg by mouth See admin instructions. Chew 4 tablets (1000 mg) by mouth 3 times daily with meals and 2 tablets (2000 mg) with snacks      Verbal and written Discharge instructions given to the patient. Wound care per Discharge AVS Follow-up Information    Ruta Hinds, MD Follow up in 2 week(s).   Specialties:  Vascular Surgery, Cardiology Why:  office will call Contact information: [place]  [id number] [address] Follow up.   Why:  rolling walker arranged- to be delivered to room prior to discharge.  Contact information: [place] [address] [phone]           Signed: Laurence Slate [facility] 01/13/2017, 11:40 AM

## 2017-01-20 ENCOUNTER — Encounter: Payer: Medicare Other | Admitting: Vascular Surgery

## 2017-01-27 ENCOUNTER — Encounter: Payer: Self-pay | Admitting: Vascular Surgery

## 2017-01-31 ENCOUNTER — Encounter: Payer: Self-pay | Admitting: Vascular Surgery

## 2017-02-09 ENCOUNTER — Encounter: Payer: Medicare Other | Admitting: Vascular Surgery

## 2017-03-13 ENCOUNTER — Other Ambulatory Visit (HOSPITAL_COMMUNITY): Payer: Self-pay | Admitting: Nephrology

## 2017-03-13 DIAGNOSIS — N186 End stage renal disease: Secondary | ICD-10-CM

## 2017-03-13 DIAGNOSIS — Z992 Dependence on renal dialysis: Principal | ICD-10-CM

## 2017-03-15 ENCOUNTER — Other Ambulatory Visit: Payer: Self-pay | Admitting: Radiology

## 2017-03-15 MED ORDER — SODIUM CHLORIDE 0.9 % IV SOLN: INTRAVENOUS | Status: AC

## 2017-03-15 MED ORDER — DEXTROSE 5 % IV SOLN: 2.0000 g | INTRAVENOUS | Status: AC

## 2017-03-16 ENCOUNTER — Encounter (HOSPITAL_COMMUNITY): Payer: Self-pay | Admitting: Radiology

## 2017-03-16 ENCOUNTER — Ambulatory Visit (HOSPITAL_COMMUNITY)
Admission: RE | Admit: 2017-03-16 | Discharge: 2017-03-16 | Disposition: A | Discharge status: Home / Self Care | Payer: Medicare Other | Source: Ambulatory Visit | Attending: Nephrology | Admitting: Nephrology

## 2017-03-16 DIAGNOSIS — Z992 Dependence on renal dialysis: Secondary | ICD-10-CM | POA: Insufficient documentation

## 2017-03-16 DIAGNOSIS — N186 End stage renal disease: Secondary | ICD-10-CM

## 2017-03-16 DIAGNOSIS — Z452 Encounter for adjustment and management of vascular access device: Secondary | ICD-10-CM | POA: Insufficient documentation

## 2017-03-16 HISTORY — PX: IR REMOVAL TUN CV CATH W/O FL: IMG2289

## 2017-03-16 MED ORDER — LIDOCAINE HCL 1 % IJ SOLN
INTRAMUSCULAR | Status: DC | PRN
  Administered 2017-03-16: 10 mL

## 2017-03-16 MED ORDER — CHLORHEXIDINE GLUCONATE 4 % EX LIQD
CUTANEOUS | Status: AC
  Filled 2017-03-16: qty 15

## 2017-03-16 MED ORDER — LIDOCAINE HCL 1 % IJ SOLN
INTRAMUSCULAR | Status: AC
  Filled 2017-03-16: qty 20

## 2017-03-16 NOTE — Procedures (Signed)
Right internal jugular tunneled hemodialysis catheter removed in its entirety without immediate complications. Medication used-1% lidocaine to the skin and subcutaneous tissue.Vaseline gauze dressing applied to site.

## 2017-06-09 IMAGING — US IR US GUIDE VASC ACCESS LEFT
1 series · 1 of 1 positions shown · non-contrast
Comparison: 03/30/2015 and earlier studies

CLINICAL DATA: Occluded dialysis graft
TECHNIQUE: The procedure, risks (including but not limited to bleeding,
infection, organ damage ), benefits, and alternatives were explained
to the patient. Questions regarding the procedure were encouraged
and answered. The patient understands and consents to the procedure.

[Series 1: ir (id) (id)/(id) · 1 of 1 slices shown]
[im 1/1]
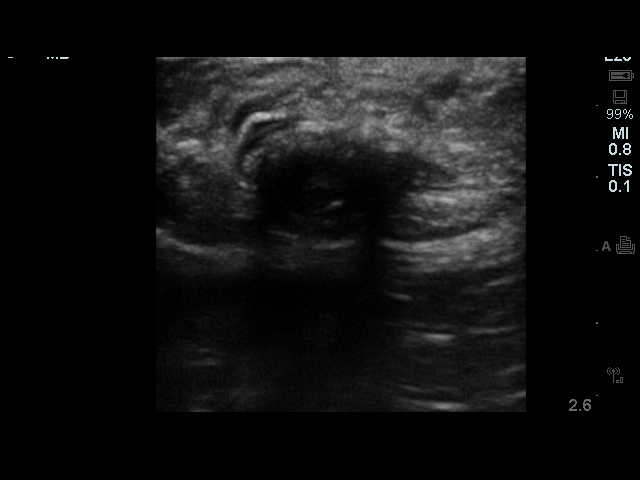

[1 of 1 positions shown; findings below may reference images not displayed]

EXAM:
EXAM
DIALYSIS GRAFT DECLOT

VENOUS ANGIOPLASTY

ARTERIAL ANASTOMOSIS ANGIOPLASTY

ULTRASOUND GUIDANCE FOR VASCULAR ACCESS X2
Intravenous Fentanyl and Versed were administered as conscious
sedation during continuous cardiorespiratory monitoring by the
radiology RN, with a total moderate sedation time of 40 minutes.

The arterial limb of the graft was accessed antegrade with a
21-gauge micropuncture needle under real-time ultrasonic guidance
after the overlying skin prepped with Betadine, draped in usual
sterile fashion, infiltrated locally with 1% lidocaine. Needle
exchanged over 018 guidewire for transitional dilator through which
2 mg t-PA was administered. Ultrasound imaging documentation was
saved. Through the dilator, a Bentson wire was advanced to the
venous anastomosis. Over this a 6F sheath was placed, through which
a 5 French Kumpe catheter was advanced for outflow venography. This
showed patency of the outflow venous system through the IVC. 8555
units heparin were administered. The Arrow PTD device was used to
macerate thrombus in the graft.

In similar fashion, the venous limb was accessed retrograde under
ultrasound with a micropuncture needle, exchanged for a transitional
dilator. The venous limb dilator was exchanged in similar fashion
for a 6 French vascular sheath. The PTD device was advanced into the
arterial arterial limb for mechanical thrombolysis. He would not
pass beyond the arterial anastomosis. For this reason, the 5 French
Kumpe was advanced retrograde with the Benson wire across the
arterial anastomosis, exchanged for a Fogarty catheter, which was
passed twice across the arterial anastomosis to dislodge platelet
plug. Injection showed clearance of thrombus from the graft. Balloon
angioplasty of the venous anastomosis recurrent stenosis was
performed using a 10mm x 4 cm Conquest angioplasty balloon with good
response. Balloon was removed and injection showed patency of the
anastomosis, without extravasation or other apparent complication.
Balloon angioplasty of foot arterial anastomotic recurrent stenosis
was performed with a 5 mm x 4 cm angioplasty balloon. Follow-up
injection demonstrated continued patency, no extravasation or
dissection.

A follow shuntogram was performed, demonstrating good flow through
the outflow vein, no extravasation. Reflux across the arterial
anastomosis demonstrate this to be widely patent, with unremarkable
appearance of the visualized native arterial circulation. The
catheter and sheaths were then removed and hemostasis achieved with
2-0 Ethilon sutures. Patient tolerated procedure well.

FLUOROSCOPY TIME:  3 minutes 56 seconds, 239 mGy

COMPLICATIONS:
COMPLICATIONS
none
IMPRESSION: 1. Technically successful declot of left thigh synthetic
hemodialysis graft.
2. Technically successful balloon angioplasty of recurrent venous
anastomotic stenosis.
3. Technically successful 5 mm balloon angioplasty of recurrent
arterial anastomotic stenosis.

ACCESS:
Remains approachable for percutaneous intervention as needed.

## 2017-07-24 ENCOUNTER — Emergency Department (HOSPITAL_COMMUNITY): Payer: Self-pay

## 2017-07-24 ENCOUNTER — Encounter (HOSPITAL_COMMUNITY): Payer: Self-pay

## 2017-07-24 DIAGNOSIS — N186 End stage renal disease: Secondary | ICD-10-CM | POA: Insufficient documentation

## 2017-07-24 DIAGNOSIS — R05 Cough: Secondary | ICD-10-CM | POA: Insufficient documentation

## 2017-07-24 DIAGNOSIS — I120 Hypertensive chronic kidney disease with stage 5 chronic kidney disease or end stage renal disease: Secondary | ICD-10-CM | POA: Insufficient documentation

## 2017-07-24 DIAGNOSIS — Z992 Dependence on renal dialysis: Secondary | ICD-10-CM | POA: Insufficient documentation

## 2017-07-24 DIAGNOSIS — Z79899 Other long term (current) drug therapy: Secondary | ICD-10-CM | POA: Insufficient documentation

## 2017-07-24 DIAGNOSIS — Z87891 Personal history of nicotine dependence: Secondary | ICD-10-CM | POA: Insufficient documentation

## 2017-07-24 MED ORDER — ALBUTEROL SULFATE (2.5 MG/3ML) 0.083% IN NEBU
5.0000 mg | INHALATION_SOLUTION | Freq: Once | RESPIRATORY_TRACT | Status: AC
  Administered 2017-07-24: 5 mg via RESPIRATORY_TRACT

## 2017-07-24 MED ORDER — ALBUTEROL SULFATE (2.5 MG/3ML) 0.083% IN NEBU
INHALATION_SOLUTION | RESPIRATORY_TRACT | Status: AC
  Filled 2017-07-24: qty 6

## 2017-07-24 NOTE — ED Triage Notes (Signed)
Pt arrives a&ox 4; pt c/o dry cough and congestion for 1 week with SOB; pt takes dialysis MWF; pt states he completed dialysis today with not complications; Pt able to speak in full and complete sentences;Pt denies pain on arrival.-Monique,RN

## 2017-07-25 ENCOUNTER — Emergency Department (HOSPITAL_COMMUNITY)
Admission: EM | Admit: 2017-07-25 | Discharge: 2017-07-25 | Disposition: A | Discharge status: Home / Self Care | Payer: Self-pay | Attending: Emergency Medicine | Admitting: Emergency Medicine

## 2017-07-25 DIAGNOSIS — R059 Cough, unspecified: Secondary | ICD-10-CM

## 2017-07-25 DIAGNOSIS — R05 Cough: Secondary | ICD-10-CM

## 2017-07-25 MED ORDER — BENZONATATE 100 MG PO CAPS: 100.0000 mg | ORAL_CAPSULE | Freq: Three times a day (TID) | ORAL | 0 refills | Status: DC

## 2017-07-25 MED ORDER — FLUTICASONE PROPIONATE 50 MCG/ACT NA SUSP: 2.0000 | Freq: Every day | NASAL | 2 refills | Status: DC

## 2017-07-25 NOTE — ED Provider Notes (Signed)
[facility] EMERGENCY DEPARTMENT Provider Note   CSN: [id number] Arrival date & time: 07/24/17  1957     History   Chief Complaint Chief Complaint  Patient presents with  . Cough  . Shortness of Breath    HPI [name] is a 46 y.o. male.  The history is provided by the patient and medical records.  Cough  Associated symptoms include shortness of breath.  Shortness of Breath  Associated symptoms include cough.    46 y.o. M with hx of anemia, anxiety, cough, depression, ESRD on hemodialysis (schedule MWF), HTN, headaches, thyroid disease, presenting to the ED for cough.  States this has been ongoing for about a week.  Cough is dry and non-productive.  States his voice has also been a little hoarse with nasal congestion recently.  No fever, chills, sweats.  He did have dialysis today, full session without issue.  Has not tried any medications for his symptoms.  No chest pain or SOB at present.  Past Medical History:  Diagnosis Date  . Anemia   . Anxiety   . Cough    DRY    . Depression   . ESRD on hemodialysis ([place])    HD [facility] MWF  . Headache(784.0)   . Hypertension   . Muscle spasms of neck    BACK, NECK  . Pneumonia 2015ish  . Thyroid disease     Patient Active Problem List   Diagnosis Date Noted  . ESRD on dialysis ([place]) 01/10/2017  . Hyperkalemia   . Problem with vascular access 10/07/2016  . Hypertension 10/07/2016  . Hypertensive urgency 10/07/2016  . Normocytic anemia 10/07/2016  . Pseudoaneurysm of arteriovenous graft ([place]) 11/06/2015  . Cellulitis of left thigh 08/26/2015  . Abdominal pain 08/26/2015  . Complication from renal dialysis device   . ESRD (end stage renal disease) on dialysis ([place])   . ESRD (end stage renal disease) ([place])   . Sepsis ([place]) 12/14/2014  . Hypotension 12/14/2014  . Diarrhea   . Hypocalcemia   . Secondary hyperparathyroidism of renal origin ([place]) 04/24/2014  . Hyperparathyroidism,  secondary ([place]) 03/21/2014    Past Surgical History:  Procedure Laterality Date  . ARTERIOVENOUS GRAFT PLACEMENT Left    "forearm, it's not working; thigh"   . ARTERIOVENOUS GRAFT PLACEMENT Left 11/09/2015  . AV FISTULA PLACEMENT Right 01/10/2017   Procedure: INSERTION OF ARTERIOVENOUS Right thigh GORE-TEX GRAFT;  Surgeon: [name], MD;  Location: [facility];  Service: Vascular;  Laterality: Right;  . FALSE ANEURYSM REPAIR Left 11/09/2015   Procedure: REPAIR OF LEFT FEMORAL PSEUDOANEURYSM; REVISION  OF LEFT THIGH ARTERIOVENOUS GRAFT USING 6MM X 10 CM GORETEX GRAFT ;  Surgeon: [name], MD;  Location: [facility];  Service: Vascular;  Laterality: Left;  . IR GENERIC HISTORICAL  07/15/2016   IR US GUIDE VASC ACCESS LEFT 07/15/2016 [name], MD [facility]  . IR GENERIC HISTORICAL Left 07/15/2016   IR THROMBECTOMY AV FISTULA W/THROMBOLYSIS/PTA INC/SHUNT/IMG LEFT 07/15/2016 [name], MD [facility]  . IR GENERIC HISTORICAL  10/05/2016   IR US GUIDE VASC ACCESS LEFT 10/05/2016 [name], MD [facility]  . IR GENERIC HISTORICAL Left 10/05/2016   IR THROMBECTOMY AV FISTULA W/THROMBOLYSIS/PTA INC/SHUNT/IMG LEFT 10/05/2016 [name], MD [facility]  . IR GENERIC HISTORICAL  10/08/2016   IR FLUORO GUIDE CV LINE RIGHT 10/08/2016 [name], MD [facility]  . IR GENERIC HISTORICAL  10/08/2016   IR US GUIDE VASC ACCESS  RIGHT 10/08/2016 [name], MD [facility]  . IR REMOVAL TUN CV CATH W/O FL  03/16/2017  . PARATHYROIDECTOMY N/A 04/24/2014   Procedure: TOTAL 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;  Surgeon: [name], MD;  Location: [facility];  Service: General;  Laterality: N/A;  NECK AND LEFT FOREARM  . PERIPHERAL VASCULAR CATHETERIZATION N/A 05/05/2016   Procedure: A/V Shuntogram;  Surgeon: Conrad Ludden, MD;  Location: [facility];  Service: Cardiovascular;  Laterality: N/A;  . PERIPHERAL VASCULAR CATHETERIZATION Left 05/05/2016   Procedure: Peripheral Vascular Balloon  Angioplasty;  Surgeon: Conrad Nicollet, MD;  Location: [facility];  Service: Cardiovascular;  Laterality: Left;  . PSEUDOANEURYSM REPAIR Left 11/09/2015  . THROMBECTOMY / ARTERIOVENOUS GRAFT REVISION Left 08/18/2015   thigh  . THROMBECTOMY AND REVISION OF ARTERIOVENTOUS (AV) GORETEX  GRAFT Left 08/23/2015   Procedure: THROMBECTOMY AND REVISION OF ARTERIOVENTOUS (AV) GORETEX  GRAFT LEFT THIGH ;  Surgeon: [name], MD;  Location: [facility];  Service: Vascular;  Laterality: Left;  . THROMBECTOMY W/ EMBOLECTOMY Left 08/18/2015   Procedure: THROMBECTOMY  AND REVISION ARTERIOVENOUS GORE-TEX GRAFT/LEFT THIGH;  Surgeon: [name], MD;  Location: [facility];  Service: Vascular;  Laterality: Left;  . UPPER EXTREMITY VENOGRAPHY Bilateral 12/27/2016   Procedure: Upper Extremity Venography;  Surgeon: [name], MD;  Location: [facility];  Service: Cardiovascular;  Laterality: Bilateral;  . VENOGRAM Left 05/05/2016   Procedure: Venogram;  Surgeon: Conrad Surry, MD;  Location: [facility];  Service: Cardiovascular;  Laterality: Left;  lower extremity       Home Medications    Prior to Admission medications   Medication Sig Start Date End Date Taking? Authorizing Provider  acetaminophen (TYLENOL) 500 MG tablet Take 1,000 mg by mouth daily as needed for headache (pain).     [provider]  amLODipine (NORVASC) 10 MG tablet Take 10 mg by mouth daily.     [provider]  b complex-vitamin c-folic acid (NEPHRO-VITE) 0.8 MG TABS tablet Take 1 tablet by mouth daily.    [provider]  calcium elemental as carbonate (TUMS ULTRA 1000) 400 MG chewable tablet Chew 2,000-4,000 mg by mouth See admin instructions. Chew 4 tablets (1000 mg) by mouth 3 times daily with meals and 2 tablets (2000 mg) with snacks    [provider]  carvedilol (COREG) 12.5 MG tablet Take 12.5 mg by mouth 2 (two) times daily with a meal.     [provider]    oxyCODONE-acetaminophen (PERCOCET/ROXICET) 5-325 MG tablet Take 1 tablet by mouth every 4 (four) hours as needed for moderate pain. 01/11/17   Ulyses Amor, PA-C    Family History No family history on file.  Social History Social History  Substance Use Topics  . Smoking status: Former Smoker    Quit date: 03/21/1986  . Smokeless tobacco: Never Used  . Alcohol use No     Allergies   Lisinopril; Ivp dye [iodinated diagnostic agents]; and Solu-medrol [methylprednisolone]   Review of Systems Review of Systems  Respiratory: Positive for cough and shortness of breath.   All other systems reviewed and are negative.    Physical Exam Updated Vital Signs BP (!) 164/102   Pulse 86   Temp 98.2 F (36.8 C) (Oral)   Resp (!) 21   SpO2 99%   Physical Exam  Constitutional: He is oriented to person, place, and time. He appears well-developed and well-nourished.  HENT:  Head: Normocephalic and atraumatic.  Right Ear: Tympanic membrane and ear canal normal.  Left Ear: Tympanic membrane and ear canal normal.  Nose: Mucosal edema and rhinorrhea (clear) present.  Mouth/Throat: Uvula is midline and mucous membranes are normal. No oropharyngeal exudate, posterior oropharyngeal edema, posterior oropharyngeal erythema or tonsillar abscesses.  Mild amount of PND Tonsils overall normal in appearance bilaterally without exudate; uvula midline without evidence of peritonsillar abscess; handling secretions appropriately; no difficulty swallowing or speaking; normal phonation without stridor  Eyes: Pupils are equal, round, and reactive to light. Conjunctivae and EOM are normal.  Neck: Normal range of motion.  Cardiovascular: Normal rate, regular rhythm and normal heart sounds.   Pulmonary/Chest: Effort normal and breath sounds normal. No respiratory distress. He has no wheezes.  Lungs are clear bilaterally, able to speak in full sentences without issue, no distress, O2 sats 100% on room air  during conversation  Abdominal: Soft. Bowel sounds are normal. There is no tenderness. There is no rebound.  Musculoskeletal: Normal range of motion.  Dialysis fistula right thigh, thrill noted  Neurological: He is alert and oriented to person, place, and time.  Skin: Skin is warm and dry.  Psychiatric: He has a normal mood and affect.  Nursing note and vitals reviewed.    ED Treatments / Results  Labs (all labs ordered are listed, but only abnormal results are displayed) Labs Reviewed - No data to display  EKG  EKG Interpretation None       Radiology Dg Chest 2 View  Result Date: 07/24/2017 CLINICAL DATA:  Dry cough and shortness of breath for the past week. EXAM: CHEST  2 VIEW COMPARISON:  06/01/2016. FINDINGS: Normal sized heart. Tortuous aorta. Clear lungs. Unremarkable bones. IMPRESSION: No acute abnormality. Electronically Signed   By: Claudie Revering M.D.   On: 07/24/2017 21:27    Procedures Procedures (including critical care time)  Medications Ordered in ED Medications  albuterol (PROVENTIL) (2.5 MG/3ML) 0.083% nebulizer solution 5 mg (5 mg Nebulization Given 07/24/17 2105)     Initial Impression / Assessment and Plan / ED Course  I have reviewed the triage vital signs and the nursing notes.  Pertinent labs & imaging results that were available during my care of the patient were reviewed by me and considered in my medical decision making (see chart for details).  46 year old male here with cough.  States this is been ongoing for a week. Cough is dry and nonproductive. Denies any fever or chills. Does report some nasal congestion. He is afebrile, nontoxic. Does have some nasal congestion and postnasal drip on exam. Lungs are clear bilaterally. He is in no acute distress, vital signs are 100% during the conversation.  Denies chest pain or SOB currently.  Chest x-ray was obtained and is negative for acute infiltrate or vascular congestion/pulmonary edema. Patient did  dialyze earlier today, full session without complication. He does not appear clinically fluid overloaded. Given the constellation of his symptoms, I feel this may represent a viral infection. This was discussed with patient. He requested medications for cough and congestion which were provided. I recommended he follow-up closely with his primary care doctor.  Discussed plan with patient, he acknowledged understanding and agreed with plan of care.  Return precautions given for new or worsening symptoms.  Final Clinical Impressions(s) / ED Diagnoses   Final diagnoses:  Cough    New Prescriptions Discharge Medication List as of 07/25/2017  4:29 AM    START taking these medications   Details  benzonatate (TESSALON) 100 MG capsule Take 1 capsule (100 mg total) by mouth every 8 (eight) hours., Starting Tue 07/25/2017, Print    fluticasone (FLONASE) 50 MCG/ACT nasal spray Place 2 sprays into both nostrils daily., Starting Tue 07/25/2017, Print         Larene Pickett, PA-C 07/25/17 8166    Merryl Hacker, MD 07/26/17 [phone]

## 2017-07-25 NOTE — ED Notes (Signed)
Pt states that he feels breathing improved after neb tx given at triage. Reports of orthopnea at home.

## 2017-07-25 NOTE — Discharge Instructions (Signed)
As we discussed, your x-ray did not show any signs of infection or fluid overload.  You may have a virus causing your symptoms. Take the prescribed medication as directed. Follow-up with your primary care doctor. Return to the ED for new or worsening symptoms.

## 2017-07-25 NOTE — ED Notes (Signed)
Pt departed in NAD, refused use of wheelchair.  

## 2017-08-09 ENCOUNTER — Encounter (HOSPITAL_COMMUNITY): Payer: Self-pay | Admitting: Emergency Medicine

## 2017-08-09 DIAGNOSIS — F329 Major depressive disorder, single episode, unspecified: Secondary | ICD-10-CM | POA: Insufficient documentation

## 2017-08-09 DIAGNOSIS — Z79891 Long term (current) use of opiate analgesic: Secondary | ICD-10-CM | POA: Insufficient documentation

## 2017-08-09 DIAGNOSIS — Z791 Long term (current) use of non-steroidal anti-inflammatories (NSAID): Secondary | ICD-10-CM | POA: Insufficient documentation

## 2017-08-09 DIAGNOSIS — Z992 Dependence on renal dialysis: Secondary | ICD-10-CM | POA: Insufficient documentation

## 2017-08-09 DIAGNOSIS — I120 Hypertensive chronic kidney disease with stage 5 chronic kidney disease or end stage renal disease: Secondary | ICD-10-CM | POA: Insufficient documentation

## 2017-08-09 DIAGNOSIS — Z87891 Personal history of nicotine dependence: Secondary | ICD-10-CM | POA: Insufficient documentation

## 2017-08-09 DIAGNOSIS — Z79899 Other long term (current) drug therapy: Secondary | ICD-10-CM | POA: Insufficient documentation

## 2017-08-09 DIAGNOSIS — F419 Anxiety disorder, unspecified: Secondary | ICD-10-CM | POA: Insufficient documentation

## 2017-08-09 DIAGNOSIS — D631 Anemia in chronic kidney disease: Secondary | ICD-10-CM | POA: Insufficient documentation

## 2017-08-09 DIAGNOSIS — N2581 Secondary hyperparathyroidism of renal origin: Secondary | ICD-10-CM | POA: Insufficient documentation

## 2017-08-09 DIAGNOSIS — N186 End stage renal disease: Secondary | ICD-10-CM | POA: Insufficient documentation

## 2017-08-09 LAB — CBC WITH DIFFERENTIAL/PLATELET
BASOS ABS: 0 10*3/uL (ref 0.0–0.1)
Basophils Relative: 0 %
EOS PCT: 2 %
Eosinophils Absolute: 0.1 10*3/uL (ref 0.0–0.7)
HCT: 32.6 % — ABNORMAL LOW (ref 39.0–52.0)
Hemoglobin: 10.3 g/dL — ABNORMAL LOW (ref 13.0–17.0)
LYMPHS PCT: 32 %
Lymphs Abs: 1.6 10*3/uL (ref 0.7–4.0)
MCH: 28.8 pg (ref 26.0–34.0)
MCHC: 31.6 g/dL (ref 30.0–36.0)
MCV: 91.1 fL (ref 78.0–100.0)
MONO ABS: 0.3 10*3/uL (ref 0.1–1.0)
Monocytes Relative: 7 %
Neutro Abs: 3 10*3/uL (ref 1.7–7.7)
Neutrophils Relative %: 59 %
PLATELETS: 348 10*3/uL (ref 150–400)
RBC: 3.58 MIL/uL — ABNORMAL LOW (ref 4.22–5.81)
RDW: 14.6 % (ref 11.5–15.5)
WBC: 5 10*3/uL (ref 4.0–10.5)

## 2017-08-09 NOTE — ED Triage Notes (Signed)
Patient reports SOB onset today worse when lying on bed and exertion , occasional dry cough , he missed his hemodialysis ( Mon/Wed/Fri)  today . No fever or chills .

## 2017-08-10 ENCOUNTER — Emergency Department (HOSPITAL_COMMUNITY): Payer: Self-pay

## 2017-08-10 ENCOUNTER — Non-Acute Institutional Stay (HOSPITAL_COMMUNITY)
Admission: EM | Admit: 2017-08-10 | Discharge: 2017-08-10 | Disposition: A | Discharge status: Home / Self Care | Payer: Self-pay | Attending: Emergency Medicine | Admitting: Emergency Medicine

## 2017-08-10 DIAGNOSIS — J811 Chronic pulmonary edema: Secondary | ICD-10-CM | POA: Diagnosis present

## 2017-08-10 DIAGNOSIS — D631 Anemia in chronic kidney disease: Secondary | ICD-10-CM

## 2017-08-10 DIAGNOSIS — Z992 Dependence on renal dialysis: Secondary | ICD-10-CM

## 2017-08-10 DIAGNOSIS — E8779 Other fluid overload: Secondary | ICD-10-CM

## 2017-08-10 DIAGNOSIS — N186 End stage renal disease: Secondary | ICD-10-CM

## 2017-08-10 DIAGNOSIS — R0602 Shortness of breath: Secondary | ICD-10-CM

## 2017-08-10 LAB — COMPREHENSIVE METABOLIC PANEL
ALT: 11 U/L — ABNORMAL LOW (ref 17–63)
AST: 14 U/L — AB (ref 15–41)
Albumin: 3.2 g/dL — ABNORMAL LOW (ref 3.5–5.0)
Alkaline Phosphatase: 71 U/L (ref 38–126)
Anion gap: 15 (ref 5–15)
BUN: 51 mg/dL — AB (ref 6–20)
CHLORIDE: 96 mmol/L — AB (ref 101–111)
CO2: 26 mmol/L (ref 22–32)
Calcium: 8.7 mg/dL — ABNORMAL LOW (ref 8.9–10.3)
Creatinine, Ser: 17.09 mg/dL — ABNORMAL HIGH (ref 0.61–1.24)
GFR, EST AFRICAN AMERICAN: 3 mL/min — AB (ref >60)
GFR, EST NON AFRICAN AMERICAN: 3 mL/min — AB (ref >60)
Glucose, Bld: 97 mg/dL (ref 65–99)
POTASSIUM: 3.5 mmol/L (ref 3.5–5.1)
Sodium: 137 mmol/L (ref 135–145)
Total Bilirubin: 0.7 mg/dL (ref 0.3–1.2)
Total Protein: 6.6 g/dL (ref 6.5–8.1)

## 2017-08-10 MED ORDER — ACETAMINOPHEN 325 MG PO TABS
650.0000 mg | ORAL_TABLET | Freq: Four times a day (QID) | ORAL | Status: DC | PRN
  Administered 2017-08-10: 650 mg via ORAL

## 2017-08-10 MED ORDER — SODIUM CHLORIDE 0.9 % IV SOLN: 100.0000 mL | INTRAVENOUS | Status: DC | PRN

## 2017-08-10 MED ORDER — LIDOCAINE HCL (PF) 1 % IJ SOLN: 5.0000 mL | INTRAMUSCULAR | Status: DC | PRN

## 2017-08-10 MED ORDER — HEPARIN SODIUM (PORCINE) 1000 UNIT/ML DIALYSIS
1000.0000 [IU] | INTRAMUSCULAR | Status: DC | PRN
  Filled 2017-08-10: qty 1

## 2017-08-10 MED ORDER — LIDOCAINE-PRILOCAINE 2.5-2.5 % EX CREA
1.0000 "application " | TOPICAL_CREAM | CUTANEOUS | Status: DC | PRN
  Filled 2017-08-10: qty 5

## 2017-08-10 MED ORDER — HEPARIN SODIUM (PORCINE) 1000 UNIT/ML DIALYSIS
20.0000 [IU]/kg | INTRAMUSCULAR | Status: DC | PRN
  Filled 2017-08-10: qty 2

## 2017-08-10 MED ORDER — PENTAFLUOROPROP-TETRAFLUOROETH EX AERO
1.0000 "application " | INHALATION_SPRAY | CUTANEOUS | Status: DC | PRN
  Filled 2017-08-10: qty 30

## 2017-08-10 MED ORDER — ACETAMINOPHEN 325 MG PO TABS
ORAL_TABLET | ORAL | Status: AC
  Administered 2017-08-10: 650 mg via ORAL
  Filled 2017-08-10: qty 2

## 2017-08-10 NOTE — ED Provider Notes (Addendum)
[facility] EMERGENCY DEPARTMENT Provider Note   CSN: [id number] Arrival date & time: 08/09/17  2237     History   Chief Complaint Chief Complaint  Patient presents with  . Shortness of Breath    Missed Hemodialysis    HPI [name] is a 46 y.o. male.  The history is provided by the patient.  He has a history of end-stage renal disease on hemodialysis, and is dialyzed every Monday-Wednesday-Friday.  He missed his dialysis yesterday.  He comes in now with shortness of breath which is worse with lying flat, worse with exertion.  He denies chest pain.  He denies cough.  He denies fever.  Past Medical History:  Diagnosis Date  . Anemia   . Anxiety   . Cough    DRY    . Depression   . ESRD on hemodialysis ([place])    HD [facility] MWF  . Headache(784.0)   . Hypertension   . Muscle spasms of neck    BACK, NECK  . Pneumonia 2015ish  . Thyroid disease     Patient Active Problem List   Diagnosis Date Noted  . ESRD on dialysis ([place]) 01/10/2017  . Hyperkalemia   . Problem with vascular access 10/07/2016  . Hypertension 10/07/2016  . Hypertensive urgency 10/07/2016  . Normocytic anemia 10/07/2016  . Pseudoaneurysm of arteriovenous graft ([place]) 11/06/2015  . Cellulitis of left thigh 08/26/2015  . Abdominal pain 08/26/2015  . Complication from renal dialysis device   . ESRD (end stage renal disease) on dialysis ([place])   . ESRD (end stage renal disease) ([place])   . Sepsis ([place]) 12/14/2014  . Hypotension 12/14/2014  . Diarrhea   . Hypocalcemia   . Secondary hyperparathyroidism of renal origin ([place]) 04/24/2014  . Hyperparathyroidism, secondary ([place]) 03/21/2014    Past Surgical History:  Procedure Laterality Date  . ARTERIOVENOUS GRAFT PLACEMENT Left    "forearm, it's not working; thigh"   . ARTERIOVENOUS GRAFT PLACEMENT Left 11/09/2015  . IR GENERIC HISTORICAL  07/15/2016   IR US GUIDE VASC ACCESS LEFT 07/15/2016 [name], MD [facility]  . IR GENERIC HISTORICAL Left 07/15/2016   IR THROMBECTOMY AV FISTULA W/THROMBOLYSIS/PTA INC/SHUNT/IMG LEFT 07/15/2016 [name], MD [facility]  . IR GENERIC HISTORICAL  10/05/2016   IR US GUIDE VASC ACCESS LEFT 10/05/2016 [name], MD [facility]  . IR GENERIC HISTORICAL Left 10/05/2016   IR THROMBECTOMY AV FISTULA W/THROMBOLYSIS/PTA INC/SHUNT/IMG LEFT 10/05/2016 [name], MD [facility]  . IR GENERIC HISTORICAL  10/08/2016   IR FLUORO GUIDE CV LINE RIGHT 10/08/2016 [name], MD [facility]  . IR GENERIC HISTORICAL  10/08/2016   IR US GUIDE VASC ACCESS RIGHT 10/08/2016 [name], MD [facility]  . IR REMOVAL TUN CV CATH W/O FL  03/16/2017  . PSEUDOANEURYSM REPAIR Left 11/09/2015  . THROMBECTOMY / ARTERIOVENOUS GRAFT REVISION Left 08/18/2015   thigh       Home Medications    Prior to Admission medications   Medication Sig Start Date End Date Taking? Authorizing Provider  acetaminophen (TYLENOL) 500 MG tablet Take 1,000 mg by mouth daily as needed for headache (pain).     [provider]  amLODipine (NORVASC) 10 MG tablet Take 10 mg by mouth daily.     [provider]  b complex-vitamin c-folic acid (NEPHRO-VITE) 0.8 MG TABS tablet Take 1 tablet by mouth daily.    [provider]  benzonatate (TESSALON) 100 MG capsule Take 1  capsule (100 mg total) by mouth every 8 (eight) hours. 07/25/17   Larene Pickett, PA-C  calcium elemental as carbonate (TUMS ULTRA 1000) 400 MG chewable tablet Chew 2,000-4,000 mg by mouth See admin instructions. Chew 4 tablets (1000 mg) by mouth 3 times daily with meals and 2 tablets (2000 mg) with snacks    [provider]  carvedilol (COREG) 12.5 MG tablet Take 12.5 mg by mouth 2 (two) times daily with a meal.     [provider]  fluticasone (FLONASE) 50 MCG/ACT nasal spray Place 2 sprays into both nostrils daily. 07/25/17   Larene Pickett, PA-C  oxyCODONE-acetaminophen (PERCOCET/ROXICET) 5-325 MG  tablet Take 1 tablet by mouth every 4 (four) hours as needed for moderate pain. 01/11/17   Ulyses Amor, PA-C    Family History No family history on file.  Social History Social History   Tobacco Use  . Smoking status: Former Smoker    Last attempt to quit: 03/21/1986    Years since quitting: 31.4  . Smokeless tobacco: Never Used  Substance Use Topics  . Alcohol use: No    Alcohol/week: 0.0 oz  . Drug use: No     Allergies   Lisinopril; Ivp dye [iodinated diagnostic agents]; and Solu-medrol [methylprednisolone]   Review of Systems Review of Systems  All other systems reviewed and are negative.    Physical Exam Updated Vital Signs BP (!) 158/107 (BP Location: Right Arm)   Pulse 92   Temp 98.6 F (37 C) (Oral)   Resp 16   SpO2 100%   Physical Exam  Nursing note and vitals reviewed.  46 year old male, resting comfortably and in no acute distress. Vital signs are significant for hypertension. Oxygen saturation is 100%, which is normal. Head is normocephalic and atraumatic. PERRLA, EOMI. Oropharynx is clear. Neck is nontender and supple without adenopathy or JVD. Back is nontender and there is no CVA tenderness. Lungs have bibasilar rales halfway up.  There are no wheezes or rhonchi. Chest is nontender. Heart has regular rate and rhythm without murmur. Abdomen is soft, flat, nontender without masses or hepatosplenomegaly and peristalsis is normoactive. Extremities have no cyanosis or edema, full range of motion is present.  There is 2+ presacral edema.  AV graft is present in the right thigh with thrill present. Skin is warm and dry without rash. Neurologic: Mental status is normal, cranial nerves are intact, there are no motor or sensory deficits.  ED Treatments / Results  Labs (all labs ordered are listed, but only abnormal results are displayed) Labs Reviewed  CBC WITH DIFFERENTIAL/PLATELET - Abnormal; Notable for the following components:      Result Value     RBC 3.58 (*)    Hemoglobin 10.3 (*)    HCT 32.6 (*)    All other components within normal limits  COMPREHENSIVE METABOLIC PANEL - Abnormal; Notable for the following components:   Chloride 96 (*)    BUN 51 (*)    Creatinine, Ser 17.09 (*)    Calcium 8.7 (*)    Albumin 3.2 (*)    AST 14 (*)    ALT 11 (*)    GFR calc non Af Amer 3 (*)    GFR calc Af Amer 3 (*)    All other components within normal limits    EKG Interpretation  Date/Time:  Wednesday August 09 2017 23:29:30 EST Ventricular Rate:  91 PR Interval:  188 QRS Duration: 80 QT Interval:  394 QTC Calculation:  484 R Axis:   38 Text Interpretation:  Normal sinus rhythm Prolonged QT Abnormal ECG When compared with ECG of 07/24/2017, QT has lengthened Confirmed by Delora Fuel ([id number]) on 08/10/2017 5:29:15 AM      Radiology Dg Chest 2 View  Result Date: 08/10/2017 CLINICAL DATA:  Shortness of breath today. Cough. Missed hemodialysis yesterday. EXAM: CHEST  2 VIEW COMPARISON:  07/24/2017 FINDINGS: Heart size and pulmonary vascularity are normal. Suggestion of mild perihilar infiltration which may indicate early fluid overload. No focal consolidation. No blunting of costophrenic angles. No pneumothorax. Mediastinal contours appear intact. Surgical clips in the base of the neck. IMPRESSION: Suggestion of mild perihilar infiltration indicating early fluid overload. No focal consolidation. Electronically Signed   By: Lucienne Capers M.D.   On: 08/10/2017 00:21    Procedures Procedures (including critical care time)  Medications Ordered in ED Medications - No data to display   Initial Impression / Assessment and Plan / ED Course  I have reviewed the triage vital signs and the nursing notes.  Pertinent labs & imaging results that were available during my care of the patient were reviewed by me and considered in my medical decision making (see chart for details).  Dialysis patient with a fluid overload secondary to  missing dialysis.  There is no indication for emergent dialysis.  No hyperkalemia, maintaining adequate oxygen saturation on room air.  Old records are reviewed, and he has several hospitalizations related to his renal disease.  Case is discussed with Dr. Moshe Cipro of nephrology service who will try to arrange dialysis at his usual dialysis center today.  Final Clinical Impressions(s) / ED Diagnoses   Final diagnoses:  Shortness of breath  Other fluid overload  End-stage renal disease on hemodialysis ([place])  Anemia in chronic kidney disease, on chronic dialysis [facility])    ED Discharge Orders    None       Delora Fuel, MD 38/33/38 [id number]    Delora Fuel, MD 91/66/06 2258

## 2017-08-10 NOTE — Progress Notes (Signed)
Pt completed Dialysis tx and discharged home in stable condition per MD order.

## 2017-08-10 NOTE — ED Notes (Signed)
Repeat EKG completed

## 2017-08-10 NOTE — Progress Notes (Signed)
I was called by ER- pt missed HD on 11/7 and came in SOB, not hypoxic, no urgent labs. I checked with his home unit but they reported that they were unable to run him today.  So, he will have OP HD at [facility], be discharged home after treatment and is encouraged to present for his OP HD tomorrow 11/9.   [name] A

## 2018-06-14 ENCOUNTER — Inpatient Hospital Stay (HOSPITAL_COMMUNITY)
Admission: EM | Admit: 2018-06-14 | Discharge: 2018-06-20 | DRG: 270 | Disposition: A | Discharge status: Home / Self Care | Payer: Medicaid Other | Attending: Internal Medicine | Admitting: Internal Medicine

## 2018-06-14 ENCOUNTER — Encounter (HOSPITAL_COMMUNITY): Payer: Self-pay | Admitting: Emergency Medicine

## 2018-06-14 DIAGNOSIS — T82868A Thrombosis of vascular prosthetic devices, implants and grafts, initial encounter: Secondary | ICD-10-CM

## 2018-06-14 DIAGNOSIS — E872 Acidosis: Secondary | ICD-10-CM | POA: Diagnosis present

## 2018-06-14 DIAGNOSIS — T8249XA Other complication of vascular dialysis catheter, initial encounter: Secondary | ICD-10-CM

## 2018-06-14 DIAGNOSIS — T82868D Thrombosis of vascular prosthetic devices, implants and grafts, subsequent encounter: Secondary | ICD-10-CM

## 2018-06-14 DIAGNOSIS — F329 Major depressive disorder, single episode, unspecified: Secondary | ICD-10-CM | POA: Diagnosis present

## 2018-06-14 DIAGNOSIS — Z91041 Radiographic dye allergy status: Secondary | ICD-10-CM

## 2018-06-14 DIAGNOSIS — E875 Hyperkalemia: Secondary | ICD-10-CM | POA: Diagnosis present

## 2018-06-14 DIAGNOSIS — Z888 Allergy status to other drugs, medicaments and biological substances status: Secondary | ICD-10-CM

## 2018-06-14 DIAGNOSIS — Z789 Other specified health status: Secondary | ICD-10-CM

## 2018-06-14 DIAGNOSIS — T82898A Other specified complication of vascular prosthetic devices, implants and grafts, initial encounter: Secondary | ICD-10-CM | POA: Diagnosis present

## 2018-06-14 DIAGNOSIS — F419 Anxiety disorder, unspecified: Secondary | ICD-10-CM | POA: Diagnosis present

## 2018-06-14 DIAGNOSIS — Z79899 Other long term (current) drug therapy: Secondary | ICD-10-CM

## 2018-06-14 DIAGNOSIS — T82319A Breakdown (mechanical) of unspecified vascular grafts, initial encounter: Principal | ICD-10-CM | POA: Diagnosis present

## 2018-06-14 DIAGNOSIS — N186 End stage renal disease: Secondary | ICD-10-CM | POA: Diagnosis present

## 2018-06-14 DIAGNOSIS — I959 Hypotension, unspecified: Secondary | ICD-10-CM | POA: Diagnosis present

## 2018-06-14 DIAGNOSIS — Z87891 Personal history of nicotine dependence: Secondary | ICD-10-CM

## 2018-06-14 DIAGNOSIS — I120 Hypertensive chronic kidney disease with stage 5 chronic kidney disease or end stage renal disease: Secondary | ICD-10-CM | POA: Diagnosis present

## 2018-06-14 DIAGNOSIS — N2581 Secondary hyperparathyroidism of renal origin: Secondary | ICD-10-CM | POA: Diagnosis present

## 2018-06-14 DIAGNOSIS — T82898D Other specified complication of vascular prosthetic devices, implants and grafts, subsequent encounter: Secondary | ICD-10-CM

## 2018-06-14 DIAGNOSIS — Z992 Dependence on renal dialysis: Secondary | ICD-10-CM

## 2018-06-14 DIAGNOSIS — Z79891 Long term (current) use of opiate analgesic: Secondary | ICD-10-CM

## 2018-06-14 DIAGNOSIS — Y832 Surgical operation with anastomosis, bypass or graft as the cause of abnormal reaction of the patient, or of later complication, without mention of misadventure at the time of the procedure: Secondary | ICD-10-CM | POA: Diagnosis present

## 2018-06-14 LAB — CBC WITH DIFFERENTIAL/PLATELET
ABS IMMATURE GRANULOCYTES: 0 10*3/uL (ref 0.0–0.1)
BASOS ABS: 0.1 10*3/uL (ref 0.0–0.1)
Basophils Relative: 1 %
EOS PCT: 1 %
Eosinophils Absolute: 0.1 10*3/uL (ref 0.0–0.7)
HEMATOCRIT: 41.2 % (ref 39.0–52.0)
HEMOGLOBIN: 13.1 g/dL (ref 13.0–17.0)
Immature Granulocytes: 0 %
LYMPHS ABS: 1.3 10*3/uL (ref 0.7–4.0)
LYMPHS PCT: 21 %
MCH: 30.9 pg (ref 26.0–34.0)
MCHC: 31.8 g/dL (ref 30.0–36.0)
MCV: 97.2 fL (ref 78.0–100.0)
MONO ABS: 0.7 10*3/uL (ref 0.1–1.0)
MONOS PCT: 13 %
NEUTROS ABS: 3.7 10*3/uL (ref 1.7–7.7)
Neutrophils Relative %: 64 %
Platelets: 237 10*3/uL (ref 150–400)
RBC: 4.24 MIL/uL (ref 4.22–5.81)
RDW: 14.2 % (ref 11.5–15.5)
WBC: 5.8 10*3/uL (ref 4.0–10.5)

## 2018-06-14 LAB — I-STAT CG4 LACTIC ACID, ED
LACTIC ACID, VENOUS: 0.95 mmol/L (ref 0.5–1.9)
LACTIC ACID, VENOUS: 1.14 mmol/L (ref 0.5–1.9)

## 2018-06-14 LAB — BASIC METABOLIC PANEL
Anion gap: 25 — ABNORMAL HIGH (ref 5–15)
BUN: 84 mg/dL — AB (ref 6–20)
CHLORIDE: 96 mmol/L — AB (ref 98–111)
CO2: 18 mmol/L — AB (ref 22–32)
Calcium: 8 mg/dL — ABNORMAL LOW (ref 8.9–10.3)
Creatinine, Ser: 22.29 mg/dL — ABNORMAL HIGH (ref 0.61–1.24)
GFR calc Af Amer: 2 mL/min — ABNORMAL LOW (ref >60)
GFR, EST NON AFRICAN AMERICAN: 2 mL/min — AB (ref >60)
GLUCOSE: 83 mg/dL (ref 70–99)
POTASSIUM: 5.8 mmol/L — AB (ref 3.5–5.1)
Sodium: 139 mmol/L (ref 135–145)

## 2018-06-14 LAB — I-STAT CHEM 8, ED
BUN: 82 mg/dL — AB (ref 6–20)
CALCIUM ION: 0.86 mmol/L — AB (ref 1.15–1.40)
Chloride: 103 mmol/L (ref 98–111)
Glucose, Bld: 78 mg/dL (ref 70–99)
HCT: 42 % (ref 39.0–52.0)
HEMOGLOBIN: 14.3 g/dL (ref 13.0–17.0)
Potassium: 5.2 mmol/L — ABNORMAL HIGH (ref 3.5–5.1)
Sodium: 137 mmol/L (ref 135–145)
TCO2: 22 mmol/L (ref 22–32)

## 2018-06-14 LAB — I-STAT TROPONIN, ED: Troponin i, poc: 0.04 ng/mL (ref 0.00–0.08)

## 2018-06-14 NOTE — ED Provider Notes (Signed)
[facility] Provider Note   CSN: [id number] Arrival date & time: 06/14/18  1808     History   Chief Complaint Chief Complaint  Patient presents with  . Vascular Access Problem    HPI [name] is a 47 y.o. male who presents with vascular access problem. PMH significant for ESRD on dialysis M, W, F, HTN. He states that on Tuesday he noticed that his fistula in his right upper thigh did not have a pulse. It also felt a little swollen and he may have had a fever. He has been taking a lot of tylneol and the swelling has improved. He called the dialysis center [facility] and tried to refer him somewhere but he came here because he's always had his care here. He denies chest pain, SOB, cough, abdominal pain, N/V/D. Nephrologist is Dr. Lorrene Reid.  HPI  Past Medical History:  Diagnosis Date  . Anemia   . Anxiety   . Cough    DRY    . Depression   . ESRD on hemodialysis ([place])    HD [facility] MWF  . Headache(784.0)   . Hypertension   . Muscle spasms of neck    BACK, NECK  . Pneumonia 2015ish  . Thyroid disease     Patient Active Problem List   Diagnosis Date Noted  . Pulmonary edema 08/10/2017  . ESRD on dialysis ([place]) 01/10/2017  . Hyperkalemia   . Problem with vascular access 10/07/2016  . Hypertension 10/07/2016  . Hypertensive urgency 10/07/2016  . Normocytic anemia 10/07/2016  . Pseudoaneurysm of arteriovenous graft ([place]) 11/06/2015  . Cellulitis of left thigh 08/26/2015  . Abdominal pain 08/26/2015  . Complication from renal dialysis device   . ESRD (end stage renal disease) on dialysis ([place])   . ESRD (end stage renal disease) ([place])   . Sepsis ([place]) 12/14/2014  . Hypotension 12/14/2014  . Diarrhea   . Hypocalcemia   . Secondary hyperparathyroidism of renal origin ([place]) 04/24/2014  . Hyperparathyroidism, secondary ([place]) 03/21/2014    Past Surgical History:  Procedure Laterality Date  . ARTERIOVENOUS GRAFT  PLACEMENT Left    "forearm, it's not working; thigh"   . ARTERIOVENOUS GRAFT PLACEMENT Left 11/09/2015  . AV FISTULA PLACEMENT Right 01/10/2017   Procedure: INSERTION OF ARTERIOVENOUS Right thigh GORE-TEX GRAFT;  Surgeon: [name], MD;  Location: [facility];  Service: Vascular;  Laterality: Right;  . FALSE ANEURYSM REPAIR Left 11/09/2015   Procedure: REPAIR OF LEFT FEMORAL PSEUDOANEURYSM; REVISION  OF LEFT THIGH ARTERIOVENOUS GRAFT USING 6MM X 10 CM GORETEX GRAFT ;  Surgeon: [name], MD;  Location: [facility];  Service: Vascular;  Laterality: Left;  . IR GENERIC HISTORICAL  07/15/2016   IR US GUIDE VASC ACCESS LEFT 07/15/2016 [name], MD [facility]  . IR GENERIC HISTORICAL Left 07/15/2016   IR THROMBECTOMY AV FISTULA W/THROMBOLYSIS/PTA INC/SHUNT/IMG LEFT 07/15/2016 [name], MD [facility]  . IR GENERIC HISTORICAL  10/05/2016   IR US GUIDE VASC ACCESS LEFT 10/05/2016 [name], MD [facility]  . IR GENERIC HISTORICAL Left 10/05/2016   IR THROMBECTOMY AV FISTULA W/THROMBOLYSIS/PTA INC/SHUNT/IMG LEFT 10/05/2016 [name], MD [facility]  . IR GENERIC HISTORICAL  10/08/2016   IR FLUORO GUIDE CV LINE RIGHT 10/08/2016 [name], MD [facility]  . IR GENERIC HISTORICAL  10/08/2016   IR US GUIDE VASC ACCESS RIGHT 10/08/2016 [name], MD [facility]  . IR REMOVAL TUN CV CATH W/O FL  03/16/2017  . PARATHYROIDECTOMY N/A 04/24/2014   Procedure: TOTAL 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;  Surgeon: [name], MD;  Location: [facility];  Service: General;  Laterality: N/A;  NECK AND LEFT FOREARM  . PERIPHERAL VASCULAR CATHETERIZATION N/A 05/05/2016   Procedure: A/V Shuntogram;  Surgeon: Conrad Moonachie, MD;  Location: [facility];  Service: Cardiovascular;  Laterality: N/A;  . PERIPHERAL VASCULAR CATHETERIZATION Left 05/05/2016   Procedure: Peripheral Vascular Balloon Angioplasty;  Surgeon: Conrad [place], MD;  Location: [facility];  Service: Cardiovascular;  Laterality:  Left;  . PSEUDOANEURYSM REPAIR Left 11/09/2015  . THROMBECTOMY / ARTERIOVENOUS GRAFT REVISION Left 08/18/2015   thigh  . THROMBECTOMY AND REVISION OF ARTERIOVENTOUS (AV) GORETEX  GRAFT Left 08/23/2015   Procedure: THROMBECTOMY AND REVISION OF ARTERIOVENTOUS (AV) GORETEX  GRAFT LEFT THIGH ;  Surgeon: [name], MD;  Location: [facility];  Service: Vascular;  Laterality: Left;  . THROMBECTOMY W/ EMBOLECTOMY Left 08/18/2015   Procedure: THROMBECTOMY  AND REVISION ARTERIOVENOUS GORE-TEX GRAFT/LEFT THIGH;  Surgeon: [name], MD;  Location: [facility];  Service: Vascular;  Laterality: Left;  . UPPER EXTREMITY VENOGRAPHY Bilateral 12/27/2016   Procedure: Upper Extremity Venography;  Surgeon: [name], MD;  Location: [facility];  Service: Cardiovascular;  Laterality: Bilateral;  . VENOGRAM Left 05/05/2016   Procedure: Venogram;  Surgeon: Conrad Rankin, MD;  Location: [facility];  Service: Cardiovascular;  Laterality: Left;  lower extremity        Home Medications    Prior to Admission medications   Medication Sig Start Date End Date Taking? Authorizing Provider  acetaminophen (TYLENOL) 500 MG tablet Take 1,000 mg by mouth daily as needed for headache (pain).     [provider]  amLODipine (NORVASC) 10 MG tablet Take 10 mg by mouth daily.     [provider]  b complex-vitamin c-folic acid (NEPHRO-VITE) 0.8 MG TABS tablet Take 1 tablet by mouth daily.    [provider]  benzonatate (TESSALON) 100 MG capsule Take 1 capsule (100 mg total) by mouth every 8 (eight) hours. 07/25/17   Larene Pickett, PA-C  calcium elemental as carbonate (TUMS ULTRA 1000) 400 MG chewable tablet Chew 2,000-4,000 mg by mouth See admin instructions. Chew 4 tablets (1000 mg) by mouth 3 times daily with meals and 2 tablets (2000 mg) with snacks    [provider]  carvedilol (COREG) 12.5 MG tablet Take 12.5 mg by mouth 2 (two) times daily with a meal.      [provider]  fluticasone (FLONASE) 50 MCG/ACT nasal spray Place 2 sprays into both nostrils daily. 07/25/17   Larene Pickett, PA-C  oxyCODONE-acetaminophen (PERCOCET/ROXICET) 5-325 MG tablet Take 1 tablet by mouth every 4 (four) hours as needed for moderate pain. 01/11/17   Ulyses Amor, PA-C    Family History History reviewed. No pertinent family history.  Social History Social History   Tobacco Use  . Smoking status: Former Smoker    Last attempt to quit: 03/21/1986    Years since quitting: 32.2  . Smokeless tobacco: Never Used  Substance Use Topics  . Alcohol use: No    Alcohol/week: 0.0 standard drinks  . Drug use: No     Allergies   Lisinopril; Ivp dye [iodinated diagnostic agents]; and Solu-medrol [methylprednisolone]   Review of Systems Review of Systems  Constitutional: Positive for fever.  Respiratory: Negative for shortness of breath.   Cardiovascular: Negative  for chest pain.       No pulse in graft  Gastrointestinal: Negative for abdominal pain.  Skin:       +swelling  All other systems reviewed and are negative.    Physical Exam Updated Vital Signs BP (!) 146/102   Pulse 63   Temp 98.3 F (36.8 C) (Oral)   Resp (!) 21   Wt 77.5 kg   SpO2 97%   BMI 23.83 kg/m   Physical Exam  Constitutional: He is oriented to person, place, and time. He appears well-developed and well-nourished. No distress.  HENT:  Head: Normocephalic and atraumatic.  Eyes: Pupils are equal, round, and reactive to light. Conjunctivae are normal. Right eye exhibits no discharge. Left eye exhibits no discharge. No scleral icterus.  Neck: Normal range of motion.  Cardiovascular: Normal rate and regular rhythm.  No palpable thrill in graft in the right upper thigh. No appreciable swelling or redness. 2+ DP pulse  Pulmonary/Chest: Effort normal and breath sounds normal. No respiratory distress.  Abdominal: Soft. Bowel sounds are normal. He exhibits no distension.  There is no tenderness.  Neurological: He is alert and oriented to person, place, and time.  Skin: Skin is warm and dry.  Psychiatric: He has a normal mood and affect. His behavior is normal.  Nursing note and vitals reviewed.    ED Treatments / Results  Labs (all labs ordered are listed, but only abnormal results are displayed) Labs Reviewed  BASIC METABOLIC PANEL - Abnormal; Notable for the following components:      Result Value   Potassium 5.8 (*)    Chloride 96 (*)    CO2 18 (*)    BUN 84 (*)    Creatinine, Ser 22.29 (*)    Calcium 8.0 (*)    GFR calc non Af Amer 2 (*)    GFR calc Af Amer 2 (*)    Anion gap 25 (*)    All other components within normal limits  I-STAT CHEM 8, ED - Abnormal; Notable for the following components:   Potassium 5.2 (*)    BUN 82 (*)    Creatinine, Ser >18.00 (*)    Calcium, Ion 0.86 (*)    All other components within normal limits  CBC WITH DIFFERENTIAL/PLATELET  I-STAT TROPONIN, ED  I-STAT CG4 LACTIC ACID, ED  I-STAT CG4 LACTIC ACID, ED    EKG EKG Interpretation  Date/Time:  Thursday June 14 2018 21:42:24 EDT Ventricular Rate:  60 PR Interval:    QRS Duration: 86 QT Interval:  451 QTC Calculation: 451 R Axis:   46 Text Interpretation:  Sinus rhythm slight peaking of T waves anterior consider hyperkalemia compared with 11/18 Confirmed by Aletta Edouard [phone]) on 06/14/2018 10:36:22 PM   Radiology No results found.  Procedures Procedures (including critical care time)  Medications Ordered in ED Medications - No data to display   Initial Impression / Assessment and Plan / ED Course  I have reviewed the triage vital signs and the nursing notes.  Pertinent labs & imaging results that were available during my care of the patient were reviewed by me and considered in my medical decision making (see chart for details).  Clinical Course as of Jun 14 2258  Thu Jun 14, 2017  6030 A 47 year old male dialysis dependent  currently using a graft in his right thigh.  He is Monday Wednesday Friday and did not get dialyzed since Monday.  He is complaining of pain in that right thigh with  no thrill that started on Tuesday.  Is also said he is felt feverish.  Denies any shortness of breath.  On exam he is got a slightly tender over the graft and no thrill palpated.  His potassium is slightly elevated at 5.8 and he does have a little bit of peaking of his T waves on EKG.  Clinically does not have signs of fluid overload.  We will review this with nephrology for recommendations.   [MB]    Clinical Course User Index [MB] Hayden Rasmussen, MD   88:8PM 47 year old male presents with vascular access problem. He believes his access may be clotted since there is no pulse. He also describes infectious symptoms although doesn't have a fever here, is not tachycardic, and has no leukocytosis or signs of infection on exam. Lactic acid is normal. BMP is consistent with ESRD. Potassium is mildly elevated to 5.8. EKG shows peaked T waves.   12:44 AM Have been attempting to page nephrology several times now. Will repage.  1:13 AM Discussed with Dr. Johnney Ou. She recommends admission and Kayexalate. Discussed with Dr. Myna Hidalgo who will admit.    Final Clinical Impressions(s) / ED Diagnoses   Final diagnoses:  None    ED Discharge Orders    None       Recardo Evangelist, PA-C 06/15/18 0114    Hayden Rasmussen, MD 06/15/18 1056

## 2018-06-14 NOTE — ED Triage Notes (Signed)
Pt presents with clotted dialysis access, schedule is MWF and could not get dialyized Wednesday; denies pain; access in R leg

## 2018-06-14 NOTE — ED Notes (Signed)
LABS REVIEWED, CHANGE MADE IN [facility].

## 2018-06-15 ENCOUNTER — Other Ambulatory Visit: Payer: Self-pay

## 2018-06-15 ENCOUNTER — Encounter (HOSPITAL_COMMUNITY): Payer: Self-pay | Admitting: Family Medicine

## 2018-06-15 DIAGNOSIS — T82898A Other specified complication of vascular prosthetic devices, implants and grafts, initial encounter: Secondary | ICD-10-CM | POA: Diagnosis present

## 2018-06-15 DIAGNOSIS — N186 End stage renal disease: Secondary | ICD-10-CM

## 2018-06-15 LAB — RENAL FUNCTION PANEL
Albumin: 3.6 g/dL (ref 3.5–5.0)
Anion gap: 24 — ABNORMAL HIGH (ref 5–15)
BUN: 91 mg/dL — AB (ref 6–20)
CO2: 18 mmol/L — ABNORMAL LOW (ref 22–32)
Calcium: 8 mg/dL — ABNORMAL LOW (ref 8.9–10.3)
Chloride: 98 mmol/L (ref 98–111)
Creatinine, Ser: 23.51 mg/dL — ABNORMAL HIGH (ref 0.61–1.24)
GFR calc Af Amer: 2 mL/min — ABNORMAL LOW (ref >60)
GFR, EST NON AFRICAN AMERICAN: 2 mL/min — AB (ref >60)
Glucose, Bld: 83 mg/dL (ref 70–99)
Phosphorus: 6.9 mg/dL — ABNORMAL HIGH (ref 2.5–4.6)
Potassium: 4.7 mmol/L (ref 3.5–5.1)
Sodium: 140 mmol/L (ref 135–145)

## 2018-06-15 LAB — MRSA PCR SCREENING: MRSA by PCR: NEGATIVE

## 2018-06-15 MED ORDER — ONDANSETRON HCL 4 MG PO TABS: 4.0000 mg | ORAL_TABLET | Freq: Four times a day (QID) | ORAL | Status: DC | PRN

## 2018-06-15 MED ORDER — DIPHENHYDRAMINE HCL 25 MG PO CAPS
50.0000 mg | ORAL_CAPSULE | Freq: Once | ORAL | Status: AC
  Administered 2018-06-16: 50 mg via ORAL
  Filled 2018-06-15: qty 2

## 2018-06-15 MED ORDER — ACETAMINOPHEN 325 MG PO TABS
650.0000 mg | ORAL_TABLET | Freq: Four times a day (QID) | ORAL | Status: DC | PRN
  Administered 2018-06-16 – 2018-06-19 (×7): 650 mg via ORAL
  Filled 2018-06-15 (×7): qty 2

## 2018-06-15 MED ORDER — HEPARIN SODIUM (PORCINE) 5000 UNIT/ML IJ SOLN
5000.0000 [IU] | Freq: Three times a day (TID) | INTRAMUSCULAR | Status: DC
  Administered 2018-06-18 – 2018-06-20 (×3): 5000 [IU] via SUBCUTANEOUS
  Filled 2018-06-15 (×6): qty 1

## 2018-06-15 MED ORDER — SODIUM CHLORIDE 0.9% FLUSH
3.0000 mL | Freq: Two times a day (BID) | INTRAVENOUS | Status: DC
  Administered 2018-06-17: 3 mL via INTRAVENOUS

## 2018-06-15 MED ORDER — HEPARIN SODIUM (PORCINE) 5000 UNIT/ML IJ SOLN: 5000.0000 [IU] | Freq: Three times a day (TID) | INTRAMUSCULAR | Status: DC

## 2018-06-15 MED ORDER — CALCIUM CARBONATE ANTACID 1000 MG PO CHEW: 2000.0000 mg | CHEWABLE_TABLET | ORAL | Status: DC

## 2018-06-15 MED ORDER — CHLORHEXIDINE GLUCONATE CLOTH 2 % EX PADS
6.0000 | MEDICATED_PAD | Freq: Every day | CUTANEOUS | Status: DC
  Administered 2018-06-16 – 2018-06-17 (×2): 6 via TOPICAL

## 2018-06-15 MED ORDER — CALCIUM CARBONATE ANTACID 500 MG PO CHEW
8.0000 | CHEWABLE_TABLET | Freq: Three times a day (TID) | ORAL | Status: DC
  Administered 2018-06-15 – 2018-06-18 (×6): 1600 mg via ORAL
  Filled 2018-06-15 (×9): qty 8

## 2018-06-15 MED ORDER — CALCIUM CARBONATE ANTACID 500 MG PO CHEW
4.0000 | CHEWABLE_TABLET | ORAL | Status: DC | PRN
  Filled 2018-06-15: qty 4

## 2018-06-15 MED ORDER — SODIUM POLYSTYRENE SULFONATE 15 GM/60ML PO SUSP: 15.0000 g | Freq: Once | ORAL | Status: DC

## 2018-06-15 MED ORDER — ACETAMINOPHEN 650 MG RE SUPP: 650.0000 mg | Freq: Four times a day (QID) | RECTAL | Status: DC | PRN

## 2018-06-15 MED ORDER — SODIUM CHLORIDE 0.9 % IV SOLN: 250.0000 mL | INTRAVENOUS | Status: DC | PRN

## 2018-06-15 MED ORDER — SODIUM CHLORIDE 0.9% FLUSH
3.0000 mL | Freq: Two times a day (BID) | INTRAVENOUS | Status: DC
  Administered 2018-06-15 – 2018-06-19 (×9): 3 mL via INTRAVENOUS

## 2018-06-15 MED ORDER — ONDANSETRON HCL 4 MG/2ML IJ SOLN: 4.0000 mg | Freq: Four times a day (QID) | INTRAMUSCULAR | Status: DC | PRN

## 2018-06-15 MED ORDER — SODIUM POLYSTYRENE SULFONATE 15 GM/60ML PO SUSP
30.0000 g | Freq: Once | ORAL | Status: AC
  Administered 2018-06-15: 30 g via ORAL
  Filled 2018-06-15: qty 120

## 2018-06-15 MED ORDER — SODIUM CHLORIDE 0.9% FLUSH: 3.0000 mL | INTRAVENOUS | Status: DC | PRN

## 2018-06-15 MED ORDER — SENNOSIDES-DOCUSATE SODIUM 8.6-50 MG PO TABS: 1.0000 | ORAL_TABLET | Freq: Every evening | ORAL | Status: DC | PRN

## 2018-06-15 NOTE — Progress Notes (Signed)
PROGRESS NOTE        PATIENT DETAILS Name: [name] Age: 47 y.o. Sex: male Date of Birth: 1971-03-30 Admit Date: 06/14/2018 Admitting Physician Vianne Bulls, MD NGE:[id number], No Pcp Per  Brief Narrative: Patient is a 47 y.o. male with history of ESRD-ED by his outpatient dialysis center for malfunction of his dialysis access .  He was subsequently admitted for further evaluation and treatment.  Subjective: No chest pain or shortness of breath.  Last dialysis was this past Monday.   Assessment/Plan: Problem with vascular access for HD: Await input from nephrology.  ESRD: Usual schedule is HD MWF-last HD was on Monday, HD not performed on Wednesday due to access issues.  BUN slowly rising-however he is currently awake and alert.  Nephrology following-await further recommendations.  Mild hyperkalemia: Resolved with Kayexalate.  DVT Prophylaxis: Prophylactic Heparin   Code Status: Full code   Family Communication: None at bedside  Disposition Plan: Remain inpatient-home after renal clearance  Antimicrobial agents: Anti-infectives (From admission, onward)   None      Procedures: None  CONSULTS:  nephrology  Time spent: 25- minutes-Greater than 50% of this time was spent in counseling, explanation of diagnosis, planning of further management, and coordination of care.  MEDICATIONS: Scheduled Meds: . calcium carbonate  8 tablet Oral TID WC  . heparin  5,000 Units Subcutaneous Q8H  . sodium chloride flush  3 mL Intravenous Q12H  . sodium chloride flush  3 mL Intravenous Q12H   Continuous Infusions: . sodium chloride     PRN Meds:.sodium chloride, acetaminophen **OR** acetaminophen, calcium carbonate **AND** calcium carbonate, ondansetron **OR** ondansetron (ZOFRAN) IV, senna-docusate, sodium chloride flush   PHYSICAL EXAM: Vital signs: Vitals:   06/15/18 0200 06/15/18 0212 06/15/18 0301 06/15/18 0514  BP:  134/90 (!)  145/101 (!) 133/104  Pulse: 60 (!) 42 (!) 52 61  Resp:   19   Temp:   97.8 F (36.6 C) 98.3 F (36.8 C)  TempSrc:   Oral Oral  SpO2: 94% 97% 100% 100%  Weight:   80 kg   Height:   6' (1.829 m)    Filed Weights   06/14/18 1934 06/15/18 0301  Weight: 77.5 kg 80 kg   Body mass index is 23.92 kg/m.   General appearance :Awake, alert, not in any distress. Speech Clear. Not toxic Looking Eyes:, pupils equally reactive to light and accomodation,no scleral icterus.Pink conjunctiva HEENT: Atraumatic and Normocephalic Neck: supple, no JVD. No cervical lymphadenopathy. No thyromegaly Resp:Good air entry bilaterally, no added sounds  CVS: S1 S2 regular, no murmurs.  GI: Bowel sounds present, Non tender and not distended with no gaurding, rigidity or rebound.No organomegaly Extremities: B/L Lower Ext shows no edema, both legs are warm to touch Neurology:  speech clear,Non focal, sensation is grossly intact. Psychiatric: Normal judgment and insight. Alert and oriented x 3. Normal mood. Musculoskeletal:No digital cyanosis Skin:No Rash, warm and dry Wounds:N/A  I have personally reviewed following labs and imaging studies  LABORATORY DATA: CBC: Recent Labs  Lab 06/14/18 1944 06/14/18 1957  WBC 5.8  --   NEUTROABS 3.7  --   HGB 13.1 14.3  HCT 41.2 42.0  MCV 97.2  --   PLT 237  --     Basic Metabolic Panel: Recent Labs  Lab 06/14/18 1957 06/14/18 2116 06/15/18 0652  NA 137 139 140  K 5.2* 5.8* 4.7  CL 103 96* 98  CO2  --  18* 18*  GLUCOSE 78 83 83  BUN 82* 84* 91*  CREATININE >18.00* 22.29* 23.51*  CALCIUM  --  8.0* 8.0*  PHOS  --   --  6.9*    GFR: Estimated Creatinine Clearance: 4.4 mL/min (A) (by C-G formula based on SCr of 23.51 mg/dL (H)).  Liver Function Tests: Recent Labs  Lab 06/15/18 0652  ALBUMIN 3.6   No results for input(s): LIPASE, AMYLASE in the last 168 hours. No results for input(s): AMMONIA in the last 168 hours.  Coagulation Profile: No  results for input(s): INR, PROTIME in the last 168 hours.  Cardiac Enzymes: No results for input(s): CKTOTAL, CKMB, CKMBINDEX, TROPONINI in the last 168 hours.  BNP (last 3 results) No results for input(s): PROBNP in the last 8760 hours.  HbA1C: No results for input(s): HGBA1C in the last 72 hours.  CBG: No results for input(s): GLUCAP in the last 168 hours.  Lipid Profile: No results for input(s): CHOL, HDL, LDLCALC, TRIG, CHOLHDL, LDLDIRECT in the last 72 hours.  Thyroid Function Tests: No results for input(s): TSH, T4TOTAL, FREET4, T3FREE, THYROIDAB in the last 72 hours.  Anemia Panel: No results for input(s): VITAMINB12, FOLATE, FERRITIN, TIBC, IRON, RETICCTPCT in the last 72 hours.  Urine analysis:    Component Value Date/Time   COLORURINE YELLOW 08/01/2009 0742   APPEARANCEUR TURBID (A) 08/01/2009 0742   LABSPEC 1.018 08/01/2009 0742   PHURINE 5.5 08/01/2009 0742   GLUCOSEU NEGATIVE 08/01/2009 0742   HGBUR LARGE (A) 08/01/2009 0742   BILIRUBINUR SMALL (A) 08/01/2009 0742   KETONESUR 15 (A) 08/01/2009 0742   PROTEINUR 100 (A) 08/01/2009 0742   UROBILINOGEN 1.0 08/01/2009 0742   NITRITE NEGATIVE 08/01/2009 0742   LEUKOCYTESUR LARGE (A) 08/01/2009 0742    Sepsis Labs: Lactic Acid, Venous    Component Value Date/Time   LATICACIDVEN 1.14 06/14/2018 2149    MICROBIOLOGY: Recent Results (from the past 240 hour(s))  MRSA PCR Screening     Status: None   Collection Time: 06/15/18  3:28 AM  Result Value Ref Range Status   MRSA by PCR NEGATIVE NEGATIVE Final    Comment:        The GeneXpert MRSA Assay (FDA approved for NASAL specimens only), is one component of a comprehensive MRSA colonization surveillance program. It is not intended to diagnose MRSA infection nor to guide or monitor treatment for MRSA infections. Performed at [facility], [place] [address] [place], [place] [address]     RADIOLOGY STUDIES/RESULTS: No results found.   LOS: 0 days     Oren Binet, MD  [facility]  If 7PM-7AM, please contact night-coverage  Please page via [email or web address]-click on MD name and type text message  06/15/2018, 11:37 AM

## 2018-06-15 NOTE — H&P (Addendum)
History and Physical    [name] PXT:[id number] DOB: 07-26-1971 DOA: 06/14/2018  PCP: Patient, No Pcp Per   Patient coming from: Home   Chief Complaint: Dialysis access problem   HPI: [name] is a 47 y.o. male with medical history significant for end-stage renal disease on hemodialysis, now presenting to the emergency department for evaluation of dialysis access problem.  Patient has a complicated vascular history and includes failed left forearm graft, failed right arm fistula, and left thigh graft that was thrombosed last year, has been receiving dialysis through the graft in the right thigh, but reports that this has stopped working, suspected secondary to clot.  He reports completing dialysis on 06/11/2018 via the graft in his right thigh, but was unable to on 06/13/2018 due to the suspected clot.  He reports that there has been difficulty arranging outpatient management of this and he was directed to the ED.  He denies any chest pain, palpitations, shortness of breath, or cough.  ED Course: Upon arrival to the ED, patient is found to be afebrile, saturating well on room air, and with vitals otherwise normal.  EKG features a sinus rhythm with mild peaking of T waves.  Chemistry panel is notable for potassium of 5.8, bicarbonate 18, and BUN 84.  CBC is unremarkable, lactic acid is normal, and troponin is also normal.  Nephrology was consulted by the ED physician and recommended a medical admission and Kayexalate.  Patient remains hemodynamically stable, in no apparent respiratory distress, and will be observed on the telemetry unit for ongoing evaluation and management of dialysis access problem with hyperkalemia.  Review of Systems:  All other systems reviewed and apart from HPI, are negative.  Past Medical History:  Diagnosis Date  . Anemia   . Anxiety   . Cough    DRY    . Depression   . ESRD on hemodialysis ([place])    HD [facility] MWF  . Headache(784.0)     . Hypertension   . Muscle spasms of neck    BACK, NECK  . Pneumonia 2015ish  . Thyroid disease     Past Surgical History:  Procedure Laterality Date  . ARTERIOVENOUS GRAFT PLACEMENT Left    "forearm, it's not working; thigh"   . ARTERIOVENOUS GRAFT PLACEMENT Left 11/09/2015  . AV FISTULA PLACEMENT Right 01/10/2017   Procedure: INSERTION OF ARTERIOVENOUS Right thigh GORE-TEX GRAFT;  Surgeon: [name], MD;  Location: [place];  Service: Vascular;  Laterality: Right;  . FALSE ANEURYSM REPAIR Left 11/09/2015   Procedure: REPAIR OF LEFT FEMORAL PSEUDOANEURYSM; REVISION  OF LEFT THIGH ARTERIOVENOUS GRAFT USING 6MM X 10 CM GORETEX GRAFT ;  Surgeon: [name], MD;  Location: [place];  Service: Vascular;  Laterality: Left;  . IR GENERIC HISTORICAL  07/15/2016   IR US GUIDE VASC ACCESS LEFT 07/15/2016 [name], MD [facility]  . IR GENERIC HISTORICAL Left 07/15/2016   IR THROMBECTOMY AV FISTULA W/THROMBOLYSIS/PTA INC/SHUNT/IMG LEFT 07/15/2016 [name], MD [facility]  . IR GENERIC HISTORICAL  10/05/2016   IR US GUIDE VASC ACCESS LEFT 10/05/2016 [name], MD [facility]  . IR GENERIC HISTORICAL Left 10/05/2016   IR THROMBECTOMY AV FISTULA W/THROMBOLYSIS/PTA INC/SHUNT/IMG LEFT 10/05/2016 [name], MD [facility]  . IR GENERIC HISTORICAL  10/08/2016   IR FLUORO GUIDE CV LINE RIGHT 10/08/2016 [name], MD [facility]  . IR GENERIC HISTORICAL  10/08/2016   IR US GUIDE VASC ACCESS RIGHT 10/08/2016 Legrand Como  Annamaria Boots, MD [facility]  . IR REMOVAL TUN CV CATH W/O FL  03/16/2017  . PARATHYROIDECTOMY N/A 04/24/2014   Procedure: TOTAL 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;  Surgeon: [name], MD;  Location: [place];  Service: General;  Laterality: N/A;  NECK AND LEFT FOREARM  . PERIPHERAL VASCULAR CATHETERIZATION N/A 05/05/2016   Procedure: A/V Shuntogram;  Surgeon: Conrad Orland Park, MD;  Location: [place] [facility];  Service: Cardiovascular;  Laterality: N/A;  . PERIPHERAL VASCULAR  CATHETERIZATION Left 05/05/2016   Procedure: Peripheral Vascular Balloon Angioplasty;  Surgeon: Conrad H[place]hoe Bend, MD;  Location: [place] [facility];  Service: Cardiovascular;  Laterality: Left;  . PSEUDOANEURYSM REPAIR Left 11/09/2015  . THROMBECTOMY / ARTERIOVENOUS GRAFT REVISION Left 08/18/2015   thigh  . THROMBECTOMY AND REVISION OF ARTERIOVENTOUS (AV) GORETEX  GRAFT Left 08/23/2015   Procedure: THROMBECTOMY AND REVISION OF ARTERIOVENTOUS (AV) GORETEX  GRAFT LEFT THIGH ;  Surgeon: [name], MD;  Location: [place];  Service: Vascular;  Laterality: Left;  . THROMBECTOMY W/ EMBOLECTOMY Left 08/18/2015   Procedure: THROMBECTOMY  AND REVISION ARTERIOVENOUS GORE-TEX GRAFT/LEFT THIGH;  Surgeon: [name], MD;  Location: [place];  Service: Vascular;  Laterality: Left;  . UPPER EXTREMITY VENOGRAPHY Bilateral 12/27/2016   Procedure: Upper Extremity Venography;  Surgeon: [name], MD;  Location: [place] [facility];  Service: Cardiovascular;  Laterality: Bilateral;  . VENOGRAM Left 05/05/2016   Procedure: Venogram;  Surgeon: Conrad Bristol, MD;  Location: [place] [facility];  Service: Cardiovascular;  Laterality: Left;  lower extremity     reports that he quit smoking about 32 years ago. He has never used smokeless tobacco. He reports that he does not drink alcohol or use drugs.  Allergies  Allergen Reactions  . Lisinopril Cough  . Ivp Dye [Iodinated Diagnostic Agents] Swelling    SWELLING REACTION UNSPECIFIED   . Solu-Medrol [Methylprednisolone] Nausea And Vomiting    Family History  Problem Relation Age of Onset  . Renal Disease Neg Hx      Prior to Admission medications   Medication Sig Start Date End Date Taking? Authorizing Provider  acetaminophen (TYLENOL) 500 MG tablet Take 1,000 mg by mouth daily as needed (for pain or headaches).    Yes [provider]  calcium elemental as carbonate (TUMS ULTRA 1000) 400 MG chewable tablet Chew 2,000-4,000 mg by mouth See admin  instructions. Chew 4 tablets (4,000 mg) by mouth 3 times daily with meals and 2 tablets (2,000 mg) with snacks   Yes [provider]  benzonatate (TESSALON) 100 MG capsule Take 1 capsule (100 mg total) by mouth every 8 (eight) hours. Patient not taking: Reported on 06/15/2018 07/25/17   Larene Pickett, PA-C  fluticasone [facility]) 50 MCG/ACT nasal spray Place 2 sprays into both nostrils daily. Patient not taking: Reported on 06/15/2018 07/25/17   Larene Pickett, PA-C  oxyCODONE-acetaminophen (PERCOCET/ROXICET) 5-325 MG tablet Take 1 tablet by mouth every 4 (four) hours as needed for moderate pain. Patient not taking: Reported on 06/15/2018 01/11/17   Ulyses Amor, PA-C    Physical Exam: Vitals:   06/14/18 2144 06/14/18 2145 06/14/18 2200 06/14/18 2300  BP:  (!) 133/108 (!) 146/102 (!) 128/97  Pulse: (!) 58 63  74  Resp:  16 (!) 21   Temp:      TempSrc:      SpO2: 100% 97%  94%  Weight:          Constitutional: NAD,  calm  Eyes: PERTLA, lids and conjunctivae normal ENMT: Mucous membranes are moist. Posterior pharynx clear of any exudate or lesions.   Neck: normal, supple, no masses, no thyromegaly Respiratory: clear to auscultation bilaterally, no wheezing, no crackles. Normal respiratory effort.    Cardiovascular: S1 & S2 heard, regular rate and rhythm. No extremity edema.  Abdomen: No distension, no tenderness, soft. Bowel sounds normal.  Musculoskeletal: no clubbing / cyanosis. No joint deformity upper and lower extremities.   Skin: no significant rashes, lesions, ulcers. Warm, dry, well-perfused. Neurologic: CN 2-12 grossly intact. Sensation intact. Strength 5/5 in all 4 limbs.  Psychiatric: Alert and oriented x 3. Pleasant and cooperative.     Labs on Admission: I have personally reviewed following labs and imaging studies  CBC: Recent Labs  Lab 06/14/18 1944 06/14/18 1957  WBC 5.8  --   NEUTROABS 3.7  --   HGB 13.1 14.3  HCT 41.2 42.0  MCV 97.2  --   PLT  237  --    Basic Metabolic Panel: Recent Labs  Lab 06/14/18 1957 06/14/18 2116  NA 137 139  K 5.2* 5.8*  CL 103 96*  CO2  --  18*  GLUCOSE 78 83  BUN 82* 84*  CREATININE >18.00* 22.29*  CALCIUM  --  8.0*   GFR: CrCl cannot be calculated (Unknown ideal weight.). Liver Function Tests: No results for input(s): AST, ALT, ALKPHOS, BILITOT, PROT, ALBUMIN in the last 168 hours. No results for input(s): LIPASE, AMYLASE in the last 168 hours. No results for input(s): AMMONIA in the last 168 hours. Coagulation Profile: No results for input(s): INR, PROTIME in the last 168 hours. Cardiac Enzymes: No results for input(s): CKTOTAL, CKMB, CKMBINDEX, TROPONINI in the last 168 hours. BNP (last 3 results) No results for input(s): PROBNP in the last 8760 hours. HbA1C: No results for input(s): HGBA1C in the last 72 hours. CBG: No results for input(s): GLUCAP in the last 168 hours. Lipid Profile: No results for input(s): CHOL, HDL, LDLCALC, TRIG, CHOLHDL, LDLDIRECT in the last 72 hours. Thyroid Function Tests: No results for input(s): TSH, T4TOTAL, FREET4, T3FREE, THYROIDAB in the last 72 hours. Anemia Panel: No results for input(s): VITAMINB12, FOLATE, FERRITIN, TIBC, IRON, RETICCTPCT in the last 72 hours. Urine analysis:    Component Value Date/Time   COLORURINE YELLOW 08/01/2009 0742   APPEARANCEUR TURBID (A) 08/01/2009 0742   LABSPEC 1.018 08/01/2009 0742   PHURINE 5.5 08/01/2009 0742   GLUCOSEU NEGATIVE 08/01/2009 0742   HGBUR LARGE (A) 08/01/2009 0742   BILIRUBINUR SMALL (A) 08/01/2009 0742   KETONESUR 15 (A) 08/01/2009 0742   PROTEINUR 100 (A) 08/01/2009 0742   UROBILINOGEN 1.0 08/01/2009 0742   NITRITE NEGATIVE 08/01/2009 0742   LEUKOCYTESUR LARGE (A) 08/01/2009 0742   Sepsis Labs: @LABRCNTIP (procalcitonin:4,lacticidven:4) )No results found for this or any previous visit (from the past 240 hour(s)).   Radiological Exams on Admission: No results found.  EKG:  Independently reviewed. Sinus rhythm, mild peaking of T-waves.   Assessment/Plan   1. Dialysis access problem; ESRD  - Patient reports completing dialysis on 06/11/18 but has since developed access problem with suspected clotting of AVG in right thigh  - There is hyperkalemia on admission and mild acidosis, but no HTN or significant hypervolemia - He has had complicated vascular history with failed graft in left UE, failed fistula in RUE, and failed graft in LLE  - Nephrology is consulting and much appreciated, recommending medical admission and treatment with kayexalate   2. Hyperkalemia  -  Potassium is 5.8 on admission with mild peaking of T-waves  - Continue cardiac monitoring, treat with kayexalate, and follow serial potassium levels     DVT prophylaxis: sq heparin Code Status: Full  Family Communication: Discussed with patient  Consults called: Nephrology  Admission status: Observation     Vianne Bulls, MD [facility] Pager [phone]  If 7PM-7AM, please contact night-coverage [email or web address] Password [email or web address]  06/15/2018, 1:30 AM

## 2018-06-15 NOTE — Consult Note (Signed)
Chief Complaint: Patient was seen in consultation today for right thigh dialysis graft declot Chief Complaint  Patient presents with  . Vascular Access Problem   at the request of Dr Nena Alexander   Supervising Physician: [name]  Patient Status: [facility] - In-pt  History of Present Illness: [name] is a 47 y.o. male    ESRD Known to IR Many declots; interventions; catheter placements Now with clotted RIGHT thigh graft- placed 01/2017 (have not thrombolysed this graft) Working well Monday this week Tuesday noted no thrill/pulse Wednesday clotted  Now admitted yesterday from ED Graft felt swollen Mild fever  Request made for thrombolysis right thigh graft Scheduled in IR for 9/14  Past Medical History:  Diagnosis Date  . Anemia   . Anxiety   . Cough    DRY    . Depression   . ESRD on hemodialysis ([place])    HD [facility] MWF  . Headache(784.0)   . Hypertension   . Muscle spasms of neck    BACK, NECK  . Pneumonia 2015ish  . Thyroid disease     Past Surgical History:  Procedure Laterality Date  . ARTERIOVENOUS GRAFT PLACEMENT Left    "forearm, it's not working; thigh"   . ARTERIOVENOUS GRAFT PLACEMENT Left 11/09/2015  . AV FISTULA PLACEMENT Right 01/10/2017   Procedure: INSERTION OF ARTERIOVENOUS Right thigh GORE-TEX GRAFT;  Surgeon: [name], MD;  Location: [place];  Service: Vascular;  Laterality: Right;  . FALSE ANEURYSM REPAIR Left 11/09/2015   Procedure: REPAIR OF LEFT FEMORAL PSEUDOANEURYSM; REVISION  OF LEFT THIGH ARTERIOVENOUS GRAFT USING 6MM X 10 CM GORETEX GRAFT ;  Surgeon: [name], MD;  Location: [place];  Service: Vascular;  Laterality: Left;  . IR GENERIC HISTORICAL  07/15/2016   IR US GUIDE VASC ACCESS LEFT 07/15/2016 [name], MD [facility]  . IR GENERIC HISTORICAL Left 07/15/2016   IR THROMBECTOMY AV FISTULA W/THROMBOLYSIS/PTA INC/SHUNT/IMG LEFT 07/15/2016 [name], MD [facility]  . IR GENERIC HISTORICAL   10/05/2016   IR US GUIDE VASC ACCESS LEFT 10/05/2016 [name], MD [facility]  . IR GENERIC HISTORICAL Left 10/05/2016   IR THROMBECTOMY AV FISTULA W/THROMBOLYSIS/PTA INC/SHUNT/IMG LEFT 10/05/2016 [name], MD [facility]  . IR GENERIC HISTORICAL  10/08/2016   IR FLUORO GUIDE CV LINE RIGHT 10/08/2016 [name], MD [facility]  . IR GENERIC HISTORICAL  10/08/2016   IR US GUIDE VASC ACCESS RIGHT 10/08/2016 [name], MD [facility]  . IR REMOVAL TUN CV CATH W/O FL  03/16/2017  . PARATHYROIDECTOMY N/A 04/24/2014   Procedure: TOTAL 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;  Surgeon: [name], MD;  Location: [place];  Service: General;  Laterality: N/A;  NECK AND LEFT FOREARM  . PERIPHERAL VASCULAR CATHETERIZATION N/A 05/05/2016   Procedure: A/V Shuntogram;  Surgeon: Conrad Bruin, MD;  Location: [place] [facility];  Service: Cardiovascular;  Laterality: N/A;  . PERIPHERAL VASCULAR CATHETERIZATION Left 05/05/2016   Procedure: Peripheral Vascular Balloon Angioplasty;  Surgeon: Conrad Laingsburg, MD;  Location: [place] [facility];  Service: Cardiovascular;  Laterality: Left;  . PSEUDOANEURYSM REPAIR Left 11/09/2015  . THROMBECTOMY / ARTERIOVENOUS GRAFT REVISION Left 08/18/2015   thigh  . THROMBECTOMY AND REVISION OF ARTERIOVENTOUS (AV) GORETEX  GRAFT Left 08/23/2015   Procedure: THROMBECTOMY AND REVISION OF ARTERIOVENTOUS (AV) GORETEX  GRAFT LEFT THIGH ;  Surgeon: [name], MD;  Location: [place];  Service: Vascular;  Laterality: Left;  .  THROMBECTOMY W/ EMBOLECTOMY Left 08/18/2015   Procedure: THROMBECTOMY  AND REVISION ARTERIOVENOUS GORE-TEX GRAFT/LEFT THIGH;  Surgeon: [name], MD;  Location: [place];  Service: Vascular;  Laterality: Left;  . UPPER EXTREMITY VENOGRAPHY Bilateral 12/27/2016   Procedure: Upper Extremity Venography;  Surgeon: [name], MD;  Location: [place] [facility];  Service: Cardiovascular;  Laterality: Bilateral;  . VENOGRAM Left 05/05/2016    Procedure: Venogram;  Surgeon: Conrad Trenton, MD;  Location: [place] [facility];  Service: Cardiovascular;  Laterality: Left;  lower extremity    Allergies: Lisinopril; Ivp dye [iodinated diagnostic agents]; and Solu-medrol [methylprednisolone]  Medications: Prior to Admission medications   Medication Sig Start Date End Date Taking? Authorizing Provider  acetaminophen (TYLENOL) 500 MG tablet Take 1,000 mg by mouth daily as needed (for pain or headaches).    Yes [provider]  calcium elemental as carbonate (TUMS ULTRA 1000) 400 MG chewable tablet Chew 2,000-4,000 mg by mouth See admin instructions. Chew 4 tablets (4,000 mg) by mouth 3 times daily with meals and 2 tablets (2,000 mg) with snacks   Yes [provider]  benzonatate (TESSALON) 100 MG capsule Take 1 capsule (100 mg total) by mouth every 8 (eight) hours. Patient not taking: Reported on 06/15/2018 07/25/17   Larene Pickett, PA-C  fluticasone [facility]) 50 MCG/ACT nasal spray Place 2 sprays into both nostrils daily. Patient not taking: Reported on 06/15/2018 07/25/17   Larene Pickett, PA-C  oxyCODONE-acetaminophen (PERCOCET/ROXICET) 5-325 MG tablet Take 1 tablet by mouth every 4 (four) hours as needed for moderate pain. Patient not taking: Reported on 06/15/2018 01/11/17   Ulyses Amor, PA-C     Family History  Problem Relation Age of Onset  . Renal Disease Neg Hx     Social History   Socioeconomic History  . Marital status: Married    Spouse name: Not on file  . Number of children: Not on file  . Years of education: Not on file  . Highest education level: Not on file  Occupational History  . Not on file  Social Needs  . Financial resource strain: Not on file  . Food insecurity:    Worry: Not on file    Inability: Not on file  . Transportation needs:    Medical: Not on file    Non-medical: Not on file  Tobacco Use  . Smoking status: Former Smoker    Last attempt to quit: 03/21/1986    Years since  quitting: 32.2  . Smokeless tobacco: Never Used  Substance and Sexual Activity  . Alcohol use: No    Alcohol/week: 0.0 standard drinks  . Drug use: No  . Sexual activity: Not on file  Lifestyle  . Physical activity:    Days per week: Not on file    Minutes per session: Not on file  . Stress: Not on file  Relationships  . Social connections:    Talks on phone: Not on file    Gets together: Not on file    Attends religious service: Not on file    Active member of club or organization: Not on file    Attends meetings of clubs or organizations: Not on file    Relationship status: Not on file  Other Topics Concern  . Not on file  Social History Narrative  . Not on file    Review of Systems: A 12 point ROS discussed and pertinent positives are indicated in the HPI above.  All other  systems are negative.  Review of Systems  Constitutional: Negative for activity change, appetite change and fever.  Respiratory: Negative for shortness of breath.   Cardiovascular: Negative for chest pain.  Gastrointestinal: Negative for abdominal pain.  Neurological: Negative for weakness.  Psychiatric/Behavioral: Negative for behavioral problems and confusion.    Vital Signs: BP (!) 133/104 (BP Location: Left Arm)   Pulse 61   Temp 98.3 F (36.8 C) (Oral)   Resp 19   Ht 6' (1.829 m)   Wt 176 lb 5.9 oz (80 kg)   SpO2 100%   BMI 23.92 kg/m   Physical Exam  Constitutional: He is oriented to person, place, and time.  Cardiovascular: Normal rate and regular rhythm.  Pulmonary/Chest: Effort normal and breath sounds normal.  Abdominal: Soft. Bowel sounds are normal.  Musculoskeletal: Normal range of motion.  Right thigh graft no pulse no thrill  Neurological: He is alert and oriented to person, place, and time.  Skin: Skin is warm and dry.  Psychiatric: He has a normal mood and affect. His behavior is normal. Judgment and thought content normal.  Vitals reviewed.   Imaging: No results  found.  Labs:  CBC: Recent Labs    08/09/17 2341 06/14/18 1944 06/14/18 1957  WBC 5.0 5.8  --   HGB 10.3* 13.1 14.3  HCT 32.6* 41.2 42.0  PLT 348 237  --     COAGS: No results for input(s): INR, APTT in the last 8760 hours.  BMP: Recent Labs    08/09/17 2341 06/14/18 1957 06/14/18 2116 06/15/18 0652  NA 137 137 139 140  K 3.5 5.2* 5.8* 4.7  CL 96* 103 96* 98  CO2 26  --  18* 18*  GLUCOSE 97 78 83 83  BUN 51* 82* 84* 91*  CALCIUM 8.7*  --  8.0* 8.0*  CREATININE 17.09* >18.00* 22.29* 23.51*  GFRNONAA 3*  --  2* 2*  GFRAA 3*  --  2* 2*    LIVER FUNCTION TESTS: Recent Labs    08/09/17 2341 06/15/18 0652  BILITOT 0.7  --   AST 14*  --   ALT 11*  --   ALKPHOS 71  --   PROT 6.6  --   ALBUMIN 3.2* 3.6    TUMOR MARKERS: No results for input(s): AFPTM, CEA, CA199, CHROMGRNA in the last 8760 hours.  Assessment and Plan:  Right thigh graft clotted Scheduled for graft thrombolysis with possible angioplasty/stent placement Possible tunneled dialysis catheter placement Risks and benefits discussed with the patient including, but not limited to bleeding, infection, vascular injury, pulmonary embolism, need for tunneled HD catheter placement or even death.  All of the patient's questions were answered, patient is agreeable to proceed. Consent signed and in chart.   Thank you for this interesting consult.  I greatly enjoyed meeting [name] and look forward to participating in their care.  A copy of this report was sent to the requesting provider on this date.  Electronically Signed: Lavonia Drafts, PA-C 06/15/2018, 12:16 PM   I spent a total of 40 Minutes    in face to face in clinical consultation, greater than 50% of which was counseling/coordinating care for rt thigh graft declot

## 2018-06-15 NOTE — Progress Notes (Signed)
Called to get report on patient. Nurse unavailable to give report.ED nurse to call floor nurse back.

## 2018-06-15 NOTE — Progress Notes (Signed)
Patient arrived to unit [place] bed 3 from emergency room. Assisted patient to bed by nursing staff.Patient alert and oriented x 4 denies pain or discomfort.Oriented patient to nursing unit and call bell system.Patient verbalizes understanding.Will continue to monitor patient.

## 2018-06-15 NOTE — Progress Notes (Signed)
Patient ID: [name], male   DOB: 07-27-1971, 47 y.o.   MRN: [id number] IR says will do in am and cannot do today. Explained urgency but to no avail.  VVS refuses to do if IR seen 1st.  Patient refuses catheter , understands consequences or uremia , ^ K , including death.  Will do HD after declot.

## 2018-06-16 ENCOUNTER — Encounter (HOSPITAL_COMMUNITY): Payer: Self-pay | Admitting: Interventional Radiology

## 2018-06-16 ENCOUNTER — Observation Stay (HOSPITAL_COMMUNITY): Payer: Medicaid Other

## 2018-06-16 DIAGNOSIS — Z888 Allergy status to other drugs, medicaments and biological substances status: Secondary | ICD-10-CM | POA: Diagnosis not present

## 2018-06-16 DIAGNOSIS — Z79899 Other long term (current) drug therapy: Secondary | ICD-10-CM | POA: Diagnosis not present

## 2018-06-16 DIAGNOSIS — N186 End stage renal disease: Secondary | ICD-10-CM | POA: Diagnosis present

## 2018-06-16 DIAGNOSIS — E872 Acidosis: Secondary | ICD-10-CM | POA: Diagnosis present

## 2018-06-16 DIAGNOSIS — Z79891 Long term (current) use of opiate analgesic: Secondary | ICD-10-CM | POA: Diagnosis not present

## 2018-06-16 DIAGNOSIS — Z87891 Personal history of nicotine dependence: Secondary | ICD-10-CM | POA: Diagnosis not present

## 2018-06-16 DIAGNOSIS — N2581 Secondary hyperparathyroidism of renal origin: Secondary | ICD-10-CM | POA: Diagnosis present

## 2018-06-16 DIAGNOSIS — F329 Major depressive disorder, single episode, unspecified: Secondary | ICD-10-CM | POA: Diagnosis present

## 2018-06-16 DIAGNOSIS — T82319A Breakdown (mechanical) of unspecified vascular grafts, initial encounter: Secondary | ICD-10-CM | POA: Diagnosis present

## 2018-06-16 DIAGNOSIS — I959 Hypotension, unspecified: Secondary | ICD-10-CM | POA: Diagnosis present

## 2018-06-16 DIAGNOSIS — E875 Hyperkalemia: Secondary | ICD-10-CM | POA: Diagnosis present

## 2018-06-16 DIAGNOSIS — Z992 Dependence on renal dialysis: Secondary | ICD-10-CM

## 2018-06-16 DIAGNOSIS — F419 Anxiety disorder, unspecified: Secondary | ICD-10-CM | POA: Diagnosis present

## 2018-06-16 DIAGNOSIS — T82898A Other specified complication of vascular prosthetic devices, implants and grafts, initial encounter: Secondary | ICD-10-CM

## 2018-06-16 DIAGNOSIS — I120 Hypertensive chronic kidney disease with stage 5 chronic kidney disease or end stage renal disease: Secondary | ICD-10-CM | POA: Diagnosis present

## 2018-06-16 DIAGNOSIS — Y832 Surgical operation with anastomosis, bypass or graft as the cause of abnormal reaction of the patient, or of later complication, without mention of misadventure at the time of the procedure: Secondary | ICD-10-CM | POA: Diagnosis present

## 2018-06-16 DIAGNOSIS — Z91041 Radiographic dye allergy status: Secondary | ICD-10-CM | POA: Diagnosis not present

## 2018-06-16 HISTORY — PX: IR US GUIDE VASC ACCESS RIGHT: IMG2390

## 2018-06-16 HISTORY — PX: IR THROMBECTOMY AV FISTULA W/THROMBOLYSIS/PTA INC/SHUNT/IMG RIGHT: IMG6119

## 2018-06-16 LAB — BASIC METABOLIC PANEL
Anion gap: 25 — ABNORMAL HIGH (ref 5–15)
BUN: 105 mg/dL — ABNORMAL HIGH (ref 6–20)
CALCIUM: 8.2 mg/dL — AB (ref 8.9–10.3)
CO2: 18 mmol/L — AB (ref 22–32)
CREATININE: 25.42 mg/dL — AB (ref 0.61–1.24)
Chloride: 95 mmol/L — ABNORMAL LOW (ref 98–111)
Glucose, Bld: 73 mg/dL (ref 70–99)
Potassium: 5.8 mmol/L — ABNORMAL HIGH (ref 3.5–5.1)
Sodium: 138 mmol/L (ref 135–145)

## 2018-06-16 LAB — CBC
HEMATOCRIT: 36.9 % — AB (ref 39.0–52.0)
HEMOGLOBIN: 12 g/dL — AB (ref 13.0–17.0)
MCH: 30.8 pg (ref 26.0–34.0)
MCHC: 32.5 g/dL (ref 30.0–36.0)
MCV: 94.9 fL (ref 78.0–100.0)
Platelets: 249 10*3/uL (ref 150–400)
RBC: 3.89 MIL/uL — ABNORMAL LOW (ref 4.22–5.81)
RDW: 13.6 % (ref 11.5–15.5)
WBC: 5.2 10*3/uL (ref 4.0–10.5)

## 2018-06-16 LAB — HIV ANTIBODY (ROUTINE TESTING W REFLEX): HIV SCREEN 4TH GENERATION: NONREACTIVE

## 2018-06-16 MED ORDER — LIDOCAINE-EPINEPHRINE (PF) 2 %-1:200000 IJ SOLN
INTRAMUSCULAR | Status: AC | PRN
  Administered 2018-06-16: 10 mL

## 2018-06-16 MED ORDER — ALTEPLASE 2 MG IJ SOLR
INTRAMUSCULAR | Status: AC
  Filled 2018-06-16: qty 4

## 2018-06-16 MED ORDER — HEPARIN SODIUM (PORCINE) 1000 UNIT/ML IJ SOLN
INTRAMUSCULAR | Status: AC | PRN
  Administered 2018-06-16: 4000 [IU] via INTRAVENOUS

## 2018-06-16 MED ORDER — IOPAMIDOL (ISOVUE-300) INJECTION 61%
INTRAVENOUS | Status: AC
  Administered 2018-06-16: 50 mL via INTRAVENOUS
  Filled 2018-06-16: qty 100

## 2018-06-16 MED ORDER — ALTEPLASE 2 MG IJ SOLR
INTRAMUSCULAR | Status: AC | PRN
  Administered 2018-06-16: 4 mg

## 2018-06-16 MED ORDER — MIDAZOLAM HCL 2 MG/2ML IJ SOLN
INTRAMUSCULAR | Status: AC
  Filled 2018-06-16: qty 4

## 2018-06-16 MED ORDER — FENTANYL CITRATE (PF) 100 MCG/2ML IJ SOLN
INTRAMUSCULAR | Status: AC
  Filled 2018-06-16: qty 2

## 2018-06-16 MED ORDER — IOPAMIDOL (ISOVUE-300) INJECTION 61%
100.0000 mL | Freq: Once | INTRAVENOUS | Status: AC | PRN
  Administered 2018-06-16: 50 mL via INTRAVENOUS

## 2018-06-16 MED ORDER — MIDAZOLAM HCL 2 MG/2ML IJ SOLN
INTRAMUSCULAR | Status: AC | PRN
  Administered 2018-06-16 (×4): 1 mg via INTRAVENOUS

## 2018-06-16 MED ORDER — FENTANYL CITRATE (PF) 100 MCG/2ML IJ SOLN
INTRAMUSCULAR | Status: AC | PRN
  Administered 2018-06-16 (×2): 50 ug via INTRAVENOUS

## 2018-06-16 MED ORDER — HEPARIN SODIUM (PORCINE) 1000 UNIT/ML IJ SOLN
INTRAMUSCULAR | Status: AC
  Filled 2018-06-16: qty 1

## 2018-06-16 MED ORDER — LIDOCAINE-EPINEPHRINE (PF) 1 %-1:200000 IJ SOLN
INTRAMUSCULAR | Status: AC
  Filled 2018-06-16: qty 30

## 2018-06-16 NOTE — Procedures (Signed)
Pre procedural Dx: ESRD Post procedural Dx: Same.   Technically successful declot of right thigh dialysis graft.   Access is ready for immediate use.  EBL: Trace  Complications: None immediate   Ronny Bacon, MD Pager #: [phone]

## 2018-06-16 NOTE — Progress Notes (Deleted)
[facility] Renal Consultation Note    Indication for Consultation:  Management of ESRD/hemodialysis; anemia, hypertension/volume and secondary hyperparathyroidism  ZOX:[id number], No Pcp Per  HPI: [name] is a 47 y.o. male with ESRD on HD MWF at [facility].  Past medical history significant for HTN, Hep B+, s/p parathyroidectomy in 2015 and Hx recurrent bacteremias related to [facility] with prior septic emboli in 2011.   Patient was admitted to observation 2 nights ago and just transferred to inpatient status for management of a thrombosed right thigh AV graft.  States 4 days ago he noticed his right thigh felt a little swollen and on closer inspection he realized his AVG no longer had a thrill. His dialysis center tried to schedule a thrombectomy as an outpatinet but was unable to get an appointment so advised patient to go to the ED 2 days ago.  He has not had dialysis since Monday and has refused a temporary catheter due to past complications.  Today reports nausea, fatigue, and weakness.  Denies SOB, CP, vomiting, diarrhea, palpitations and edema.   On presentation he had mild hyperkalemia which was improved with kayexalate but has now increased back to 5.8.  Other pertinent findings in labs collected today include Cr 25.42, BUN 105, and CO2 18.  Plans for a thrombectomy by IR today followed by dialysis.    Past Medical History:  Diagnosis Date  . Anemia   . Anxiety   . Cough    DRY    . Depression   . ESRD on hemodialysis ([place])    HD [facility] MWF  . Headache(784.0)   . Hypertension   . Muscle spasms of neck    BACK, NECK  . Pneumonia 2015ish  . Thyroid disease    Past Surgical History:  Procedure Laterality Date  . ARTERIOVENOUS GRAFT PLACEMENT Left    "forearm, it's not working; thigh"   . ARTERIOVENOUS GRAFT PLACEMENT Left 11/09/2015  . AV FISTULA PLACEMENT Right 01/10/2017   Procedure: INSERTION OF ARTERIOVENOUS Right thigh GORE-TEX GRAFT;  Surgeon:  [name], MD;  Location: [place];  Service: Vascular;  Laterality: Right;  . FALSE ANEURYSM REPAIR Left 11/09/2015   Procedure: REPAIR OF LEFT FEMORAL PSEUDOANEURYSM; REVISION  OF LEFT THIGH ARTERIOVENOUS GRAFT USING 6MM X 10 CM GORETEX GRAFT ;  Surgeon: [name], MD;  Location: [place];  Service: Vascular;  Laterality: Left;  . IR GENERIC HISTORICAL  07/15/2016   IR US GUIDE VASC ACCESS LEFT 07/15/2016 [name], MD [facility]  . IR GENERIC HISTORICAL Left 07/15/2016   IR THROMBECTOMY AV FISTULA W/THROMBOLYSIS/PTA INC/SHUNT/IMG LEFT 07/15/2016 [name], MD [facility]  . IR GENERIC HISTORICAL  10/05/2016   IR US GUIDE VASC ACCESS LEFT 10/05/2016 [name], MD [facility]  . IR GENERIC HISTORICAL Left 10/05/2016   IR THROMBECTOMY AV FISTULA W/THROMBOLYSIS/PTA INC/SHUNT/IMG LEFT 10/05/2016 [name], MD [facility]  . IR GENERIC HISTORICAL  10/08/2016   IR FLUORO GUIDE CV LINE RIGHT 10/08/2016 [name], MD [facility]  . IR GENERIC HISTORICAL  10/08/2016   IR US GUIDE VASC ACCESS RIGHT 10/08/2016 [name], MD [facility]  . IR REMOVAL TUN CV CATH W/O FL  03/16/2017  . PARATHYROIDECTOMY N/A 04/24/2014   Procedure: TOTAL 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;  Surgeon: [name], MD;  Location: [place];  Service: General;  Laterality: N/A;  NECK AND LEFT FOREARM  . PERIPHERAL VASCULAR CATHETERIZATION N/A 05/05/2016   Procedure: A/V  Shuntogram;  Surgeon: Conrad [place], MD;  Location: [place] CV LAB;  Service: Cardiovascular;  Laterality: N/A;  . PERIPHERAL VASCULAR CATHETERIZATION Left 05/05/2016   Procedure: Peripheral Vascular Balloon Angioplasty;  Surgeon: Conrad [place], MD;  Location: [place] CV LAB;  Service: Cardiovascular;  Laterality: Left;  . PSEUDOANEURYSM REPAIR Left 11/09/2015  . THROMBECTOMY / ARTERIOVENOUS GRAFT REVISION Left 08/18/2015   thigh  . THROMBECTOMY AND REVISION OF ARTERIOVENTOUS (AV) GORETEX  GRAFT Left 08/23/2015   Procedure:  THROMBECTOMY AND REVISION OF ARTERIOVENTOUS (AV) GORETEX  GRAFT LEFT THIGH ;  Surgeon: [name], MD;  Location: [place];  Service: Vascular;  Laterality: Left;  . THROMBECTOMY W/ EMBOLECTOMY Left 08/18/2015   Procedure: THROMBECTOMY  AND REVISION ARTERIOVENOUS GORE-TEX GRAFT/LEFT THIGH;  Surgeon: [name], MD;  Location: [place];  Service: Vascular;  Laterality: Left;  . UPPER EXTREMITY VENOGRAPHY Bilateral 12/27/2016   Procedure: Upper Extremity Venography;  Surgeon: [name], MD;  Location: [place] CV [facility];  Service: Cardiovascular;  Laterality: Bilateral;  . VENOGRAM Left 05/05/2016   Procedure: Venogram;  Surgeon: Conrad [place], MD;  Location: [place] CV LAB;  Service: Cardiovascular;  Laterality: Left;  lower extremity   Family History  Problem Relation Age of Onset  . Renal Disease Neg Hx    Social History:  reports that he quit smoking about 32 years ago. He has never used smokeless tobacco. He reports that he does not drink alcohol or use drugs. Allergies  Allergen Reactions  . Lisinopril Cough  . Ivp Dye [Iodinated Diagnostic Agents] Swelling    SWELLING REACTION UNSPECIFIED   . Solu-Medrol [Methylprednisolone] Nausea And Vomiting   Prior to Admission medications   Medication Sig Start Date End Date Taking? Authorizing Provider  acetaminophen (TYLENOL) 500 MG tablet Take 1,000 mg by mouth daily as needed (for pain or headaches).    Yes [provider]  calcium elemental as carbonate (TUMS ULTRA 1000) 400 MG chewable tablet Chew 2,000-4,000 mg by mouth See admin instructions. Chew 4 tablets (4,000 mg) by mouth 3 times daily with meals and 2 tablets (2,000 mg) with snacks   Yes [provider]  benzonatate (TESSALON) 100 MG capsule Take 1 capsule (100 mg total) by mouth every 8 (eight) hours. Patient not taking: Reported on 06/15/2018 07/25/17   Larene Pickett, PA-C  fluticasone [facility]) 50 MCG/ACT nasal spray Place 2 sprays into both  nostrils daily. Patient not taking: Reported on 06/15/2018 07/25/17   Larene Pickett, PA-C  oxyCODONE-acetaminophen (PERCOCET/ROXICET) 5-325 MG tablet Take 1 tablet by mouth every 4 (four) hours as needed for moderate pain. Patient not taking: Reported on 06/15/2018 01/11/17   Ulyses Amor, PA-C   Current Facility-Administered Medications  Medication Dose Route Frequency Provider Last Rate Last Dose  . 0.9 %  sodium chloride infusion  250 mL Intravenous PRN Opyd, Ilene Qua, MD      . acetaminophen (TYLENOL) tablet 650 mg  650 mg Oral Q6H PRN Opyd, Ilene Qua, MD   650 mg at 06/16/18 0912   Or  . acetaminophen (TYLENOL) suppository 650 mg  650 mg Rectal Q6H PRN Opyd, Ilene Qua, MD      . alteplase (CATHFLO ACTIVASE) 2 MG injection           . calcium carbonate (TUMS - dosed in mg elemental calcium) chewable tablet 1,600 mg of elemental calcium  8 tablet Oral TID WC Opyd, Ilene Qua, MD   Stopped at 06/16/18  0906   And  . calcium carbonate (TUMS - dosed in mg elemental calcium) chewable tablet 800 mg of elemental calcium  4 tablet Oral PRN Opyd, Ilene Qua, MD      . Chlorhexidine Gluconate Cloth 2 % PADS 6 each  6 each Topical Q0600 Lemya Greenwell, Ria Comment, [place]   6 each at 06/16/18 0609  . fentaNYL (SUBLIMAZE) 100 MCG/2ML injection           . heparin 1000 UNIT/ML injection           . [START ON 06/17/2018] heparin injection 5,000 Units  5,000 Units Subcutaneous Q8H Monia Sabal, PA-C      . iopamidol (ISOVUE-300) 61 % injection           . lidocaine-EPINEPHrine (XYLOCAINE-EPINEPHrine) 1 %-1:200000 (PF) injection           . midazolam (VERSED) 2 MG/2ML injection           . ondansetron (ZOFRAN) tablet 4 mg  4 mg Oral Q6H PRN Opyd, Ilene Qua, MD       Or  . ondansetron (ZOFRAN) injection 4 mg  4 mg Intravenous Q6H PRN Opyd, Ilene Qua, MD      . senna-docusate (Senokot-S) tablet 1 tablet  1 tablet Oral QHS PRN Opyd, Ilene Qua, MD      . sodium chloride flush (NS) 0.9 % injection 3 mL  3 mL  Intravenous Q12H Opyd, Timothy S, MD      . sodium chloride flush (NS) 0.9 % injection 3 mL  3 mL Intravenous Q12H Opyd, Ilene Qua, MD   3 mL at 06/16/18 0907  . sodium chloride flush (NS) 0.9 % injection 3 mL  3 mL Intravenous PRN Opyd, Ilene Qua, MD       Facility-Administered Medications Ordered in Other Encounters  Medication Dose Route Frequency Provider Last Rate Last Dose  . 0.9 %  sodium chloride infusion   Intravenous Continuous Monia Sabal, PA-C       Labs: Basic Metabolic Panel: Recent Labs  Lab 06/14/18 2116 06/15/18 0652 06/16/18 0926  NA 139 140 138  K 5.8* 4.7 5.8*  CL 96* 98 95*  CO2 18* 18* 18*  GLUCOSE 83 83 73  BUN 84* 91* 105*  CREATININE 22.29* 23.51* 25.42*  CALCIUM 8.0* 8.0* 8.2*  PHOS  --  6.9*  --    Liver Function Tests: Recent Labs  Lab 06/15/18 0652  ALBUMIN 3.6  CBC: Recent Labs  Lab 06/14/18 1944 06/14/18 1957  WBC 5.8  --   NEUTROABS 3.7  --   HGB 13.1 14.3  HCT 41.2 42.0  MCV 97.2  --   PLT 237  --     ROS: All others negative except those listed in HPI.  Physical Exam: Vitals:   06/15/18 1416 06/15/18 2326 06/16/18 0500 06/16/18 0548  BP: (!) 110/97 (!) 130/96  (!) 139/96  Pulse: 67 (!) 53  (!) 51  Resp: 18 15  16   Temp: 98 F (36.7 C) 98.2 F (36.8 C)  97.9 F (36.6 C)  TempSrc:  Oral  Oral  SpO2: 100% 100%  100%  Weight:   80.2 kg   Height:         General: WDWN, NAD, pleasant male Head: NCAT, +sclera icterics,  MMM Neck: Supple. No lymphadenopathy Lungs: CTA bilaterally. No wheeze, rales or rhonchi. Breathing is unlabored. Heart: bradycardia, RR. No murmur, rubs or gallops.  Abdomen: soft, nontender, +BS, no guarding, no rebound tenderness  Lower  extremities:trace edema b/l, no ischemic changes, or open wounds  Neuro: AAOx3. Moves all extremities spontaneously. Psych:  Responds to questions appropriately with a normal affect. Dialysis Access: Right thigh AVG no bruit/thrill  Dialysis Orders:  MWF - [facility]   3.75hrs, BFR 450, DFR 800,  EDW 77.5kg, 2K/ 2.5Ca, P4, linear sodium  Access: R thigh AVG  Heparin 1500 units  Assessment/Plan: 1.  Thrombosed AV graft - IR to declot today or possible TDC insertion.  2.  ESRD -  MWF schedule.  K5.8, Cr 25, BUN 105, Emergent HD scheduled today following thrombectomy. Will resume regular schedule on Monday if still admitted.  3.  Hypertension/volume  - BP elevated likely due to ^ volume, expect improvement post HD.  No antihypertensives as OP. 4.  Anemia of CKD - Hgb 14.3. No indication for ESA at this time.  5.  Secondary Hyperparathyroidism -  S/p parathyroidectomy.  Ca in goal. Phos^, continue Ca carbonate binders.  6.  Nutrition - Alb 3.6, renal diet with fluid restrictions.  [place], [facility] Pager: [phone] 06/16/2018, 1:56 PM

## 2018-06-16 NOTE — Consult Note (Addendum)
[facility] Renal Consultation Note    Indication for Consultation:  Management of ESRD/hemodialysis; anemia, hypertension/volume and secondary hyperparathyroidism  INO:[id number], No Pcp Per  HPI: [name] is a 47 y.o. male with ESRD on HD MWF at [facility].  Past medical history significant for HTN, Hep B+, s/p parathyroidectomy in 2015 and Hx recurrent bacteremias related to [facility] with prior septic emboli in 2011.   Patient was admitted to observation 2 nights ago and just transferred to inpatient status for management of a thrombosed right thigh AV graft.  States 4 days ago he noticed his right thigh felt a little swollen and on closer inspection he realized his AVG no longer had a thrill. His dialysis center tried to schedule a thrombectomy as an outpatinet but was unable to get an appointment so advised patient to go to the ED 2 days ago.  He has not had dialysis since Monday and has refused a temporary catheter due to past complications.  Today reports nausea, fatigue, and weakness.  Denies SOB, CP, vomiting, diarrhea, palpitations and edema.   On presentation he had mild hyperkalemia which was improved with kayexalate but has now increased back to 5.8.  Other pertinent findings in labs collected today include Cr 25.42, BUN 105, and CO2 18.  Plans for a thrombectomy by IR today followed by dialysis.   Past Medical History:  Diagnosis Date  . Anemia   . Anxiety   . Cough    DRY    . Depression   . ESRD on hemodialysis ([place])    HD [facility] MWF  . Headache(784.0)   . Hypertension   . Muscle spasms of neck    BACK, NECK  . Pneumonia 2015ish  . Thyroid disease    Past Surgical History:  Procedure Laterality Date  . ARTERIOVENOUS GRAFT PLACEMENT Left    "forearm, it's not working; thigh"   . ARTERIOVENOUS GRAFT PLACEMENT Left 11/09/2015  . AV FISTULA PLACEMENT Right 01/10/2017   Procedure: INSERTION OF ARTERIOVENOUS Right thigh GORE-TEX GRAFT;  Surgeon:  [name], MD;  Location: [place];  Service: Vascular;  Laterality: Right;  . FALSE ANEURYSM REPAIR Left 11/09/2015   Procedure: REPAIR OF LEFT FEMORAL PSEUDOANEURYSM; REVISION  OF LEFT THIGH ARTERIOVENOUS GRAFT USING 6MM X 10 CM GORETEX GRAFT ;  Surgeon: [name], MD;  Location: [place];  Service: Vascular;  Laterality: Left;  . IR GENERIC HISTORICAL  07/15/2016   IR US GUIDE VASC ACCESS LEFT 07/15/2016 [name], MD [facility]  . IR GENERIC HISTORICAL Left 07/15/2016   IR THROMBECTOMY AV FISTULA W/THROMBOLYSIS/PTA INC/SHUNT/IMG LEFT 07/15/2016 [name], MD [facility]  . IR GENERIC HISTORICAL  10/05/2016   IR US GUIDE VASC ACCESS LEFT 10/05/2016 [name], MD [facility]  . IR GENERIC HISTORICAL Left 10/05/2016   IR THROMBECTOMY AV FISTULA W/THROMBOLYSIS/PTA INC/SHUNT/IMG LEFT 10/05/2016 [name], MD [facility]  . IR GENERIC HISTORICAL  10/08/2016   IR FLUORO GUIDE CV LINE RIGHT 10/08/2016 [name], MD [facility]  . IR GENERIC HISTORICAL  10/08/2016   IR US GUIDE VASC ACCESS RIGHT 10/08/2016 [name], MD [facility]  . IR REMOVAL TUN CV CATH W/O FL  03/16/2017  . PARATHYROIDECTOMY N/A 04/24/2014   Procedure: TOTAL 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;  Surgeon: [name], MD;  Location: [place];  Service: General;  Laterality: N/A;  NECK AND LEFT FOREARM  . PERIPHERAL VASCULAR CATHETERIZATION N/A 05/05/2016   Procedure: A/V Shuntogram;  Surgeon: Conrad Sloan, MD;  Location: [place] CV [facility];  Service: Cardiovascular;  Laterality: N/A;  . PERIPHERAL VASCULAR CATHETERIZATION Left 05/05/2016   Procedure: Peripheral Vascular Balloon Angioplasty;  Surgeon: Conrad [place], MD;  Location: [place] [facility];  Service: Cardiovascular;  Laterality: Left;  . PSEUDOANEURYSM REPAIR Left 11/09/2015  . THROMBECTOMY / ARTERIOVENOUS GRAFT REVISION Left 08/18/2015   thigh  . THROMBECTOMY AND REVISION OF ARTERIOVENTOUS (AV) GORETEX  GRAFT Left 08/23/2015   Procedure:  THROMBECTOMY AND REVISION OF ARTERIOVENTOUS (AV) GORETEX  GRAFT LEFT THIGH ;  Surgeon: [name], MD;  Location: [place];  Service: Vascular;  Laterality: Left;  . THROMBECTOMY W/ EMBOLECTOMY Left 08/18/2015   Procedure: THROMBECTOMY  AND REVISION ARTERIOVENOUS GORE-TEX GRAFT/LEFT THIGH;  Surgeon: [name], MD;  Location: [place];  Service: Vascular;  Laterality: Left;  . UPPER EXTREMITY VENOGRAPHY Bilateral 12/27/2016   Procedure: Upper Extremity Venography;  Surgeon: [name], MD;  Location: [place] [facility];  Service: Cardiovascular;  Laterality: Bilateral;  . VENOGRAM Left 05/05/2016   Procedure: Venogram;  Surgeon: Conrad Gordon, MD;  Location: [place] [facility];  Service: Cardiovascular;  Laterality: Left;  lower extremity   Family History  Problem Relation Age of Onset  . Renal Disease Neg Hx    Social History:  reports that he quit smoking about 32 years ago. He has never used smokeless tobacco. He reports that he does not drink alcohol or use drugs. Allergies  Allergen Reactions  . Lisinopril Cough  . Ivp Dye [Iodinated Diagnostic Agents] Swelling    SWELLING REACTION UNSPECIFIED   . Solu-Medrol [Methylprednisolone] Nausea And Vomiting   Prior to Admission medications   Medication Sig Start Date End Date Taking? Authorizing Provider  acetaminophen (TYLENOL) 500 MG tablet Take 1,000 mg by mouth daily as needed (for pain or headaches).    Yes [provider]  calcium elemental as carbonate (TUMS ULTRA 1000) 400 MG chewable tablet Chew 2,000-4,000 mg by mouth See admin instructions. Chew 4 tablets (4,000 mg) by mouth 3 times daily with meals and 2 tablets (2,000 mg) with snacks   Yes [provider]  benzonatate (TESSALON) 100 MG capsule Take 1 capsule (100 mg total) by mouth every 8 (eight) hours. Patient not taking: Reported on 06/15/2018 07/25/17   Larene Pickett, PA-C  fluticasone [facility]) 50 MCG/ACT nasal spray Place 2 sprays into both  nostrils daily. Patient not taking: Reported on 06/15/2018 07/25/17   Larene Pickett, PA-C  oxyCODONE-acetaminophen (PERCOCET/ROXICET) 5-325 MG tablet Take 1 tablet by mouth every 4 (four) hours as needed for moderate pain. Patient not taking: Reported on 06/15/2018 01/11/17   Ulyses Amor, PA-C   Current Facility-Administered Medications  Medication Dose Route Frequency Provider Last Rate Last Dose  . 0.9 %  sodium chloride infusion  250 mL Intravenous PRN Opyd, Ilene Qua, MD      . acetaminophen (TYLENOL) tablet 650 mg  650 mg Oral Q6H PRN Opyd, Ilene Qua, MD   650 mg at 06/16/18 0912   Or  . acetaminophen (TYLENOL) suppository 650 mg  650 mg Rectal Q6H PRN Opyd, Ilene Qua, MD      . alteplase (CATHFLO ACTIVASE) 2 MG injection           . calcium carbonate (TUMS - dosed in mg elemental calcium) chewable tablet 1,600 mg of elemental calcium  8 tablet Oral TID WC Opyd, Ilene Qua, MD   Stopped at 06/16/18 [phone]  And  . calcium carbonate (TUMS - dosed in mg elemental calcium) chewable tablet 800 mg of elemental calcium  4 tablet Oral PRN Opyd, Ilene Qua, MD      . Chlorhexidine Gluconate Cloth 2 % PADS 6 each  6 each Topical Q0600 [place], Ria Comment, [place]   6 each at 06/16/18 0609  . fentaNYL (SUBLIMAZE) 100 MCG/2ML injection           . fentaNYL (SUBLIMAZE) injection   Intravenous PRN [name], MD   50 mcg at 06/16/18 1423  . heparin 1000 UNIT/ML injection           . [START ON 06/17/2018] heparin injection 5,000 Units  5,000 Units Subcutaneous Q8H Monia Sabal, PA-C      . heparin injection    PRN [name], MD   4,000 Units at 06/16/18 1433  . iopamidol (ISOVUE-300) 61 % injection 100 mL  100 mL Intravenous Once PRN [name], MD      . iopamidol (ISOVUE-300) 61 % injection           . lidocaine-EPINEPHrine (XYLOCAINE W/EPI) 2 %-1:200000 (PF) injection    PRN [name], MD   10 mL at 06/16/18 1423  . lidocaine-EPINEPHrine (XYLOCAINE-EPINEPHrine) 1 %-1:200000 (PF) injection            . midazolam (VERSED) 2 MG/2ML injection           . midazolam (VERSED) injection   Intravenous PRN [name], MD   1 mg at 06/16/18 1423  . ondansetron (ZOFRAN) tablet 4 mg  4 mg Oral Q6H PRN Opyd, Ilene Qua, MD       Or  . ondansetron (ZOFRAN) injection 4 mg  4 mg Intravenous Q6H PRN Opyd, Ilene Qua, MD      . senna-docusate (Senokot-S) tablet 1 tablet  1 tablet Oral QHS PRN Opyd, Ilene Qua, MD      . sodium chloride flush (NS) 0.9 % injection 3 mL  3 mL Intravenous Q12H Opyd, Timothy S, MD      . sodium chloride flush (NS) 0.9 % injection 3 mL  3 mL Intravenous Q12H Opyd, Ilene Qua, MD   3 mL at 06/16/18 0907  . sodium chloride flush (NS) 0.9 % injection 3 mL  3 mL Intravenous PRN Opyd, Ilene Qua, MD       Facility-Administered Medications Ordered in Other Encounters  Medication Dose Route Frequency Provider Last Rate Last Dose  . 0.9 %  sodium chloride infusion   Intravenous Continuous Monia Sabal, PA-C       Labs: Basic Metabolic Panel: Recent Labs  Lab 06/14/18 2116 06/15/18 0652 06/16/18 0926  NA 139 140 138  K 5.8* 4.7 5.8*  CL 96* 98 95*  CO2 18* 18* 18*  GLUCOSE 83 83 73  BUN 84* 91* 105*  CREATININE 22.29* 23.51* 25.42*  CALCIUM 8.0* 8.0* 8.2*  PHOS  --  6.9*  --    Liver Function Tests: Recent Labs  Lab 06/15/18 0652  ALBUMIN 3.6   CBC: Recent Labs  Lab 06/14/18 1944 06/14/18 1957  WBC 5.8  --   NEUTROABS 3.7  --   HGB 13.1 14.3  HCT 41.2 42.0  MCV 97.2  --   PLT 237  --     ROS: All others negative except those listed in HPI.   Physical Exam: Vitals:   06/16/18 0548 06/16/18 1356 06/16/18 1425 06/16/18 1425  BP: (!) 139/96 (!) 150/100 (!) 129/96 (!) 129/96  Pulse: Marland Kitchen)  51 (!) 50 60 (!) 54  Resp: 16 16 13 12   Temp: 97.9 F (36.6 C)     TempSrc: Oral     SpO2: 100% 100% 100% 100%  Weight:      Height:         General: WDWN, NAD, pleasant male Head: NCAT, +sclera icterics,  MMM Neck: Supple. PCL Lungs: CTA bilaterally. No wheeze,  rales or rhonchi. Breathing is unlabored. Heart: bradycardia, RR. Gr2/6 M, rubs or gallops.  Abdomen: soft, nontender, +BS, no guarding, no rebound tenderness  Lower extremities:trace edema b/l, no ischemic changes, or open wounds  Neuro: AAOx3. Moves all extremities spontaneously. Psych:  Responds to questions appropriately with a normal affect. Dialysis Access: Right thigh AVG no bruit/thrill  Dialysis Orders:  MWF - [facility]  3.75hrs, BFR 450, DFR 800,  EDW 77.5kg, 2K/ 2.5Ca, P4, linear sodium  Access: R thigh AVG  Heparin 1500 units  Assessment/Plan: 1.  Thrombosed AV graft - IR to declot today or possible TDC insertion.  2.  ESRD -  MWF schedule.  K5.8, Cr 25, BUN 105, Emergent HD scheduled today following thrombectomy. Will resume regular schedule on Monday if still admitted.  3.  Hypertension/volume  - BP elevated likely due to ^ volume, expect improvement post HD.  No antihypertensives as OP. 4.  Anemia of CKD - Hgb 14.3. No indication for ESA at this time.  5.  Secondary Hyperparathyroidism -  S/p parathyroidectomy.  Ca in goal. Phos^, continue Ca carbonate binders.  6.  Nutrition - Alb 3.6, renal diet with fluid restrictions.  [place], [facility] Pager: [phone] 06/16/2018, 2:33 PM I have seen and examined this patient and agree with the plan of care seen, eval, examined, counseled patient. Jeneen Rinks Miquel Lamson 06/17/2018, 9:40 AM

## 2018-06-16 NOTE — Sedation Documentation (Signed)
SBAR called to [place], [place] [id number].

## 2018-06-16 NOTE — Progress Notes (Signed)
PROGRESS NOTE        PATIENT DETAILS Name: [name] Age: 47 y.o. Sex: male Date of Birth: 27-May-1971 Admit Date: 06/14/2018 Admitting Physician Vianne Bulls, MD [id number], No Pcp Per  Brief Narrative: Patient is a 47 y.o. male with history of ESRD-ED by his outpatient dialysis center for malfunction of his dialysis access .  He was subsequently admitted for further evaluation and treatment.  He scheduled for declot by IR on 9/14.  Subjective: Lying comfortably in bed, no chest pain or shortness of breath.  His last dialysis was this past Monday.  Last dialysis was this past Monday.  Assessment/Plan: Problem with vascular access for HD: For declot of right thigh graft by IR later today.  Nephrology following-patient refused a temporary HD catheter.  ESRD: Usual schedule for HD is MWF, due to access issues-last HD was this past Monday.  Although BUN slowly rising-he is awake and alert.  Recheck electrolytes today.  Mild hyperkalemia: Resolved with Kayexalate-electrolytes today.  DVT Prophylaxis: Prophylactic Heparin   Code Status: Full code   Family Communication: None at bedside  Disposition Plan: Remain inpatient-home most likely tomorrow  Antimicrobial agents: Anti-infectives (From admission, onward)   None      Procedures: None  CONSULTS:  nephrology  Time spent: 25- minutes-Greater than 50% of this time was spent in counseling, explanation of diagnosis, planning of further management, and coordination of care.  MEDICATIONS: Scheduled Meds: . calcium carbonate  8 tablet Oral TID WC  . Chlorhexidine Gluconate Cloth  6 each Topical Q0600  . diphenhydrAMINE  50 mg Oral Once  . [START ON 06/17/2018] heparin  5,000 Units Subcutaneous Q8H  . sodium chloride flush  3 mL Intravenous Q12H  . sodium chloride flush  3 mL Intravenous Q12H   Continuous Infusions: . sodium chloride     PRN Meds:.sodium chloride, acetaminophen  **OR** acetaminophen, calcium carbonate **AND** calcium carbonate, ondansetron **OR** ondansetron (ZOFRAN) IV, senna-docusate, sodium chloride flush   PHYSICAL EXAM: Vital signs: Vitals:   06/15/18 1416 06/15/18 2326 06/16/18 0500 06/16/18 0548  BP: (!) 110/97 (!) 130/96  (!) 139/96  Pulse: 67 (!) 53  (!) 51  Resp: 18 15  16   Temp: 98 F (36.7 C) 98.2 F (36.8 C)  97.9 F (36.6 C)  TempSrc:  Oral  Oral  SpO2: 100% 100%  100%  Weight:   80.2 kg   Height:       Filed Weights   06/14/18 1934 06/15/18 0301 06/16/18 0500  Weight: 77.5 kg 80 kg 80.2 kg   Body mass index is 23.98 kg/m.   General appearance:Awake, alert, not in any distress.  Eyes:no scleral icterus. HEENT: Atraumatic and Normocephalic Neck: supple, no JVD. Resp:Good air entry bilaterally,no rales or rhonchi CVS: S1 S2 regular, no murmurs.  GI: Bowel sounds present, Non tender and not distended with no gaurding, rigidity or rebound. Extremities: B/L Lower Ext shows no edema, both legs are warm to touch Neurology:  Non focal Psychiatric: Normal judgment and insight. Normal mood. Musculoskeletal:No digital cyanosis Skin:No Rash, warm and dry Wounds:N/A  I have personally reviewed following labs and imaging studies  LABORATORY DATA: CBC: Recent Labs  Lab 06/14/18 1944 06/14/18 1957  WBC 5.8  --   NEUTROABS 3.7  --   HGB 13.1 14.3  HCT 41.2 42.0  MCV 97.2  --  PLT 237  --     Basic Metabolic Panel: Recent Labs  Lab 06/14/18 1957 06/14/18 2116 06/15/18 0652  NA 137 139 140  K 5.2* 5.8* 4.7  CL 103 96* 98  CO2  --  18* 18*  GLUCOSE 78 83 83  BUN 82* 84* 91*  CREATININE >18.00* 22.29* 23.51*  CALCIUM  --  8.0* 8.0*  PHOS  --   --  6.9*    GFR: Estimated Creatinine Clearance: 4.4 mL/min (A) (by C-G formula based on SCr of 23.51 mg/dL (H)).  Liver Function Tests: Recent Labs  Lab 06/15/18 0652  ALBUMIN 3.6   No results for input(s): LIPASE, AMYLASE in the last 168 hours. No results  for input(s): AMMONIA in the last 168 hours.  Coagulation Profile: No results for input(s): INR, PROTIME in the last 168 hours.  Cardiac Enzymes: No results for input(s): CKTOTAL, CKMB, CKMBINDEX, TROPONINI in the last 168 hours.  BNP (last 3 results) No results for input(s): PROBNP in the last 8760 hours.  HbA1C: No results for input(s): HGBA1C in the last 72 hours.  CBG: No results for input(s): GLUCAP in the last 168 hours.  Lipid Profile: No results for input(s): CHOL, HDL, LDLCALC, TRIG, CHOLHDL, LDLDIRECT in the last 72 hours.  Thyroid Function Tests: No results for input(s): TSH, T4TOTAL, FREET4, T3FREE, THYROIDAB in the last 72 hours.  Anemia Panel: No results for input(s): VITAMINB12, FOLATE, FERRITIN, TIBC, IRON, RETICCTPCT in the last 72 hours.  Urine analysis:    Component Value Date/Time   COLORURINE YELLOW 08/01/2009 0742   APPEARANCEUR TURBID (A) 08/01/2009 0742   LABSPEC 1.018 08/01/2009 0742   PHURINE 5.5 08/01/2009 0742   GLUCOSEU NEGATIVE 08/01/2009 0742   HGBUR LARGE (A) 08/01/2009 0742   BILIRUBINUR SMALL (A) 08/01/2009 0742   KETONESUR 15 (A) 08/01/2009 0742   PROTEINUR 100 (A) 08/01/2009 0742   UROBILINOGEN 1.0 08/01/2009 0742   NITRITE NEGATIVE 08/01/2009 0742   LEUKOCYTESUR LARGE (A) 08/01/2009 0742    Sepsis Labs: Lactic Acid, Venous    Component Value Date/Time   LATICACIDVEN 1.14 06/14/2018 2149    MICROBIOLOGY: Recent Results (from the past 240 hour(s))  MRSA PCR Screening     Status: None   Collection Time: 06/15/18  3:28 AM  Result Value Ref Range Status   MRSA by PCR NEGATIVE NEGATIVE Final    Comment:        The GeneXpert MRSA Assay (FDA approved for NASAL specimens only), is one component of a comprehensive MRSA colonization surveillance program. It is not intended to diagnose MRSA infection nor to guide or monitor treatment for MRSA infections. Performed at [facility], [place] [address], [place], [place]  [address]     RADIOLOGY STUDIES/RESULTS: No results found.   LOS: 0 days   Oren Binet, MD  [facility]  If 7PM-7AM, please contact night-coverage  Please page via [email or web address] [email or web address]-click on MD name and type text message  06/16/2018, 9:11 AM

## 2018-06-17 ENCOUNTER — Inpatient Hospital Stay (HOSPITAL_COMMUNITY): Payer: Medicaid Other

## 2018-06-17 ENCOUNTER — Encounter (HOSPITAL_COMMUNITY): Payer: Self-pay | Admitting: Interventional Radiology

## 2018-06-17 DIAGNOSIS — Z789 Other specified health status: Secondary | ICD-10-CM

## 2018-06-17 HISTORY — PX: IR US GUIDE VASC ACCESS RIGHT: IMG2390

## 2018-06-17 HISTORY — PX: IR THROMBECTOMY AV FISTULA W/THROMBOLYSIS/PTA INC/SHUNT/IMG RIGHT: IMG6119

## 2018-06-17 LAB — RENAL FUNCTION PANEL
ALBUMIN: 3.6 g/dL (ref 3.5–5.0)
ANION GAP: 21 — AB (ref 5–15)
BUN: 54 mg/dL — AB (ref 6–20)
CALCIUM: 8.5 mg/dL — AB (ref 8.9–10.3)
CO2: 21 mmol/L — ABNORMAL LOW (ref 22–32)
Chloride: 94 mmol/L — ABNORMAL LOW (ref 98–111)
Creatinine, Ser: 17.37 mg/dL — ABNORMAL HIGH (ref 0.61–1.24)
GFR calc Af Amer: 3 mL/min — ABNORMAL LOW (ref >60)
GFR calc non Af Amer: 3 mL/min — ABNORMAL LOW (ref >60)
GLUCOSE: 113 mg/dL — AB (ref 70–99)
PHOSPHORUS: 6.7 mg/dL — AB (ref 2.5–4.6)
Potassium: 4.7 mmol/L (ref 3.5–5.1)
SODIUM: 136 mmol/L (ref 135–145)

## 2018-06-17 LAB — GLUCOSE, CAPILLARY
Glucose-Capillary: 158 mg/dL — ABNORMAL HIGH (ref 70–99)
Glucose-Capillary: 65 mg/dL — ABNORMAL LOW (ref 70–99)

## 2018-06-17 MED ORDER — LIDOCAINE HCL 1 % IJ SOLN
INTRAMUSCULAR | Status: AC
  Filled 2018-06-17: qty 20

## 2018-06-17 MED ORDER — DIPHENHYDRAMINE HCL 25 MG PO CAPS
50.0000 mg | ORAL_CAPSULE | Freq: Once | ORAL | Status: AC
  Administered 2018-06-17: 50 mg via ORAL
  Filled 2018-06-17: qty 2

## 2018-06-17 MED ORDER — MIDAZOLAM HCL 2 MG/2ML IJ SOLN
INTRAMUSCULAR | Status: AC
  Filled 2018-06-17: qty 2

## 2018-06-17 MED ORDER — MIDAZOLAM HCL 2 MG/2ML IJ SOLN
INTRAMUSCULAR | Status: AC
  Filled 2018-06-17: qty 4

## 2018-06-17 MED ORDER — ALTEPLASE 2 MG IJ SOLR
INTRAMUSCULAR | Status: AC
  Filled 2018-06-17: qty 4

## 2018-06-17 MED ORDER — FENTANYL CITRATE (PF) 100 MCG/2ML IJ SOLN
INTRAMUSCULAR | Status: AC | PRN
  Administered 2018-06-17 (×4): 50 ug via INTRAVENOUS

## 2018-06-17 MED ORDER — MIDAZOLAM HCL 2 MG/2ML IJ SOLN
INTRAMUSCULAR | Status: AC | PRN
  Administered 2018-06-17 (×5): 1 mg via INTRAVENOUS

## 2018-06-17 MED ORDER — ALTEPLASE 2 MG IJ SOLR
INTRAMUSCULAR | Status: AC | PRN
  Administered 2018-06-17: 4 mg

## 2018-06-17 MED ORDER — FENTANYL CITRATE (PF) 100 MCG/2ML IJ SOLN
INTRAMUSCULAR | Status: AC
  Filled 2018-06-17: qty 2

## 2018-06-17 MED ORDER — HEPARIN SODIUM (PORCINE) 1000 UNIT/ML IJ SOLN
INTRAMUSCULAR | Status: AC
  Filled 2018-06-17: qty 1

## 2018-06-17 MED ORDER — IOPAMIDOL (ISOVUE-300) INJECTION 61%
INTRAVENOUS | Status: AC
  Administered 2018-06-17: 50 mL
  Filled 2018-06-17: qty 100

## 2018-06-17 MED ORDER — CHLORHEXIDINE GLUCONATE CLOTH 2 % EX PADS
6.0000 | MEDICATED_PAD | Freq: Every day | CUTANEOUS | Status: DC
  Administered 2018-06-18: 6 via TOPICAL

## 2018-06-17 MED ORDER — HEPARIN SODIUM (PORCINE) 1000 UNIT/ML IJ SOLN
INTRAMUSCULAR | Status: AC | PRN
  Administered 2018-06-17: 4000 [IU] via INTRAVENOUS

## 2018-06-17 MED ORDER — LIDOCAINE HCL (PF) 1 % IJ SOLN
INTRAMUSCULAR | Status: AC | PRN
  Administered 2018-06-17: 20 mL

## 2018-06-17 NOTE — Progress Notes (Signed)
PROGRESS NOTE        PATIENT DETAILS Name: [name] Age: 47 y.o. Sex: male Date of Birth: 04/14/1971 Admit Date: 06/14/2018 Admitting Physician Vianne Bulls, MD MSX:[id number], No Pcp Per  Brief Narrative: Patient is a 46 y.o. male with history of ESRD-ED by his outpatient dialysis center for malfunction of his dialysis access .  He was subsequently admitted for further evaluation and treatment.  He scheduled for declot by IR on 9/14.  Subjective: Patient in bed, appears comfortable, denies any headache, no fever, no chest pain or pressure, no shortness of breath , no abdominal pain. No focal weakness.  Assessment/Plan: Problem with vascular access for HD: With clotting of the right thigh AV graft, underwent declotting procedure on 06/16/2018 followed by successful dialysis session on same date, unfortunately his graft has again re-clotted, IR has been consulted for declotting.  Will likely dialyze again tomorrow and discharge if stable.  ESRD: Usual schedule for HD is MWF, due to access issues-last HD was this past Monday.  Although BUN slowly rising-he is awake and alert.  Recheck electrolytes today.  Mild hyperkalemia: Resolved with HD   DVT Prophylaxis: Prophylactic Heparin   Code Status: Full code   Family Communication: None at bedside  Disposition Plan: Remain inpatient-home most likely tomorrow  Antimicrobial agents: Anti-infectives (From admission, onward)   None      Procedures: None  CONSULTS:  nephrology  Time spent: 25- minutes-Greater than 50% of this time was spent in counseling, explanation of diagnosis, planning of further management, and coordination of care.  MEDICATIONS: Scheduled Meds: . calcium carbonate  8 tablet Oral TID WC  . Chlorhexidine Gluconate Cloth  6 each Topical Q0600  . heparin  5,000 Units Subcutaneous Q8H  . sodium chloride flush  3 mL Intravenous Q12H  . sodium chloride flush  3 mL  Intravenous Q12H   Continuous Infusions: . sodium chloride     PRN Meds:.sodium chloride, acetaminophen **OR** acetaminophen, calcium carbonate **AND** calcium carbonate, ondansetron **OR** ondansetron (ZOFRAN) IV, senna-docusate, sodium chloride flush   PHYSICAL EXAM: Vital signs: Vitals:   06/16/18 2324 06/17/18 0026 06/17/18 0620 06/17/18 0621  BP: 110/64 135/85  (!) 106/92  Pulse: 64 69  72  Resp: '18 17  17  ' Temp: 98.7 F (37.1 C) 98.2 F (36.8 C)  98.2 F (36.8 C)  TempSrc: Oral Oral  Oral  SpO2: 98% 100%  100%  Weight: 78 kg  78.7 kg   Height:       Filed Weights   06/16/18 1912 06/16/18 2324 06/17/18 0620  Weight: 80 kg 78 kg 78.7 kg   Body mass index is 23.53 kg/m.   Exam  Awake Alert, Oriented X 3, No new F.N deficits, Normal affect [place].AT,PERRAL Supple Neck,No JVD, No cervical lymphadenopathy appriciated.  Symmetrical Chest wall movement, Good air movement bilaterally, CTAB RRR,No Gallops, Rubs or new Murmurs, No Parasternal Heave +ve B.Sounds, Abd Soft, No tenderness, No organomegaly appriciated, No rebound - guarding or rigidity. No Cyanosis, Clubbing or edema, No new Rash or bruise Right thigh graft has no thrill  I have personally reviewed following labs and imaging studies  LABORATORY DATA: CBC: Recent Labs  Lab 06/14/18 1944 06/14/18 1957 06/16/18 1954  WBC 5.8  --  5.2  NEUTROABS 3.7  --   --   HGB 13.1 14.3 12.0*  HCT  41.2 42.0 36.9*  MCV 97.2  --  94.9  PLT 237  --  641    Basic Metabolic Panel: Recent Labs  Lab 06/14/18 1957 06/14/18 2116 06/15/18 0652 06/16/18 0926 06/17/18 1147  NA 137 139 140 138 136  K 5.2* 5.8* 4.7 5.8* 4.7  CL 103 96* 98 95* 94*  CO2  --  18* 18* 18* 21*  GLUCOSE 78 83 83 73 113*  BUN 82* 84* 91* 105* 54*  CREATININE >18.00* 22.29* 23.51* 25.42* 17.37*  CALCIUM  --  8.0* 8.0* 8.2* 8.5*  PHOS  --   --  6.9*  --  6.7*    GFR: Estimated Creatinine Clearance: 5.9 mL/min (A) (by C-G formula based on  SCr of 17.37 mg/dL (H)).  Liver Function Tests: Recent Labs  Lab 06/15/18 0652 06/17/18 1147  ALBUMIN 3.6 3.6   No results for input(s): LIPASE, AMYLASE in the last 168 hours. No results for input(s): AMMONIA in the last 168 hours.  Coagulation Profile: No results for input(s): INR, PROTIME in the last 168 hours.  Cardiac Enzymes: No results for input(s): CKTOTAL, CKMB, CKMBINDEX, TROPONINI in the last 168 hours.  BNP (last 3 results) No results for input(s): PROBNP in the last 8760 hours.  HbA1C: No results for input(s): HGBA1C in the last 72 hours.  CBG: Recent Labs  Lab 06/17/18 0024 06/17/18 0142  GLUCAP 65* 158*    Lipid Profile: No results for input(s): CHOL, HDL, LDLCALC, TRIG, CHOLHDL, LDLDIRECT in the last 72 hours.  Thyroid Function Tests: No results for input(s): TSH, T4TOTAL, FREET4, T3FREE, THYROIDAB in the last 72 hours.  Anemia Panel: No results for input(s): VITAMINB12, FOLATE, FERRITIN, TIBC, IRON, RETICCTPCT in the last 72 hours.  Urine analysis:    Component Value Date/Time   COLORURINE YELLOW 08/01/2009 0742   APPEARANCEUR TURBID (A) 08/01/2009 0742   LABSPEC 1.018 08/01/2009 0742   PHURINE 5.5 08/01/2009 0742   GLUCOSEU NEGATIVE 08/01/2009 0742   HGBUR LARGE (A) 08/01/2009 0742   BILIRUBINUR SMALL (A) 08/01/2009 0742   KETONESUR 15 (A) 08/01/2009 0742   PROTEINUR 100 (A) 08/01/2009 0742   UROBILINOGEN 1.0 08/01/2009 0742   NITRITE NEGATIVE 08/01/2009 0742   LEUKOCYTESUR LARGE (A) 08/01/2009 0742    Sepsis Labs: Lactic Acid, Venous    Component Value Date/Time   LATICACIDVEN 1.14 06/14/2018 2149    MICROBIOLOGY: Recent Results (from the past 240 hour(s))  MRSA PCR Screening     Status: None   Collection Time: 06/15/18  3:28 AM  Result Value Ref Range Status   MRSA by PCR NEGATIVE NEGATIVE Final    Comment:        The GeneXpert MRSA Assay (FDA approved for NASAL specimens only), is one component of a comprehensive MRSA  colonization surveillance program. It is not intended to diagnose MRSA infection nor to guide or monitor treatment for MRSA infections. Performed at [facility], [place] [address] [place], [place] [address]     RADIOLOGY STUDIES/RESULTS: Ir Thrombectomy Av Fistula/w Thrombolysis/pta Inc Shunt/img Right  Result Date: 06/16/2018 INDICATION: Clotted graft. End-stage renal disease, currently receiving dialysis via right thigh dialysis graft. Patient's dialysis graft has been used for the past 1 year. Patient denies previous intervention or thrombosis of the right thigh dialysis graft. EXAM: 1. FISTULALYSIS 2. ANGIOPLASTY OF VENOUS LIMB AND VENOUS ANASTOMOSIS 3. ULTRASOUND GUIDANCE FOR VENOUS ACCESS COMPARISON:  None. MEDICATIONS: Heparin 4000 units IV; TPA 4 mg into graft. CONTRAST:  40 cc Isovue-300 ANESTHESIA/SEDATION:  Moderate (conscious) sedation was employed during this procedure. A total of Versed 4 mg and Fentanyl 100 mcg was administered intravenously. Moderate Sedation Time: 28 minutes. The patient's level of consciousness and vital signs were monitored continuously by radiology nursing throughout the procedure under my direct supervision. FLUOROSCOPY TIME:  4 minutes 36 seconds (48 mGy) COMPLICATIONS: None immediate. TECHNIQUE: Informed written consent was obtained from the patient after a discussion of the risks, benefits and alternatives to treatment. Questions regarding the procedure were encouraged and answered. A timeout was performed prior to the initiation of the procedure. On physical examination, the existing right thigh dialysis graft was negative for palpable pulse or thrill. The skin overlying the graft was prepped and draped in the usual sterile fashion, and a sterile drape was applied covering the operative field. Maximum barrier sterile technique with sterile gowns and gloves were used for the procedure. Under ultrasound guidance, the dialysis graft was accessed directed  towards the venous anastomosis with a micropuncture kit after the overlying soft tissues were anesthetized with 1% lidocaine. An ultrasound image was saved for documentation purposes. The micropuncture sheath was exchange for a 7-French vascular sheath over a guidewire. Over a Benson wire, a Kumpe catheter was advanced centrally and a central venogram was performed. Pull back venogram was performed with the Kumpe catheter. Heparin was administered systemically and TPA was administered via the Kumpe catheter throughout near the entirety of the venous limb. The venous anastomosis and majority of the venous limb was angioplastied with a 6 mm x 4 cm Conquest balloon. An additional access was obtained directed towards the arterial anastomoses with a micropuncture kit after the overlying soft tissues anesthetized with 1% lidocaine. This allowed for placement of a 6-French vascular sheath. The graft was thrombectomized with several rounds of push-pull mechanical thrombectomy with an occlusion balloon. Flow was restored to the graft as evidenced by restored pulsatility of the graft and brisk blood return from the side arm of the vascular sheath. Shuntograms were performed. At this point, the procedure was terminated. All wires, catheters and sheaths were removed from the patient. Hemostasis was achieved at both access sites with deployment of a swizzle sutures which will be removed at the patient's next dialysis session. Dressings were placed. The patient tolerated the procedure without immediate postprocedural complication. FINDINGS: The existing right thigh AV graft is thrombosed. The venous anastomosis and near the entirety of the venous limb was angioplastied to 6 mm diameter. The graft was successfully thrombectomized using mechanical and pharmacologic means as above. Completion shuntogram demonstrates the venous limb and anastomosis are widely patent. 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 The central venous system, arterial limb and arterial anastomosis are widely patent. IMPRESSION: 1. Technically successful right thigh AV graft thrombolysis. 2. Successful angioplasty of the venous anastomosis and majority of the venous limb to 6 mm diameter. Completion shuntogram demonstrates the venous limb and anastomosis are widely patent. 3. The arterial anastomosis and arterial limb are widely patent. 4. The central venous system is widely patent. ACCESS: This graft would be amenable to future percutaneous intervention as clinically indicated. Electronically Signed   By: [name] M.D.   On: 06/16/2018 15:35     LOS: 1 day   Lala Lund, MD  [facility]  If 7PM-7AM, please contact night-coverage  Please page via [email or web address] [email or web address]-click on MD name and type text message  06/17/2018, 1:37 PM

## 2018-06-17 NOTE — Discharge Instructions (Signed)
Follow with Primary MD  in 7 days   Get CBC, BMP  checked  by Primary MD in 5-7 days   Activity: As tolerated with Full fall precautions use walker/cane & assistance as needed  Disposition Home    Diet:   Renal diet, 1.5lit/day fluid restriction  For Heart failure patients - Check your Weight same time everyday, if you gain over 2 pounds, or you develop in leg swelling, experience more shortness of breath or chest pain, call your Primary MD immediately. Follow Cardiac Low Salt Diet and 1.5 lit/day fluid restriction.  Special Instructions: If you have smoked or chewed Tobacco  in the last 2 yrs please stop smoking, stop any regular Alcohol  and or any Recreational drug use.  On your next visit with your primary care physician please Get Medicines reviewed and adjusted.  Please request your Prim.MD to go over all Hospital Tests and Procedure/Radiological results at the follow up, please get all Hospital records sent to your Prim MD by signing hospital release before you go home.  If you experience worsening of your admission symptoms, develop shortness of breath, life threatening emergency, suicidal or homicidal thoughts you must seek medical attention immediately by calling 911 or calling your MD immediately  if symptoms less severe.  You Must read complete instructions/literature along with all the possible adverse reactions/side effects for all the Medicines you take and that have been prescribed to you. Take any new Medicines after you have completely understood and accpet all the possible adverse reactions/side effects.

## 2018-06-17 NOTE — Progress Notes (Addendum)
[facility] Progress Note   Subjective:   Seen at bedside. Reports BP drop yesterday during HD.  This AM states he started to feel "funny" and realized his AVG had no thrill.    Objective Vitals:   06/16/18 2324 06/17/18 0026 06/17/18 0620 06/17/18 0621  BP: 110/64 135/85  (!) 106/92  Pulse: 64 69  72  Resp: '18 17  17  ' Temp: 98.7 F (37.1 C) 98.2 F (36.8 C)  98.2 F (36.8 C)  TempSrc: Oral Oral  Oral  SpO2: 98% 100%  100%  Weight: 78 kg  78.7 kg   Height:       Physical Exam General:NAD, WDWN Heart:RRR, no mrg Lungs:CTAB Abdomen:soft, NTND Extremities:no LE edema Dialysis Access: R thigh AVG no bruit/thrill   Filed Weights   06/16/18 1912 06/16/18 2324 06/17/18 0620  Weight: 80 kg 78 kg 78.7 kg    Intake/Output Summary (Last 24 hours) at 06/17/2018 0939 Last data filed at 06/16/2018 2324 Gross per 24 hour  Intake 444 ml  Output 2000 ml  Net -1556 ml    Additional Objective Labs: Basic Metabolic Panel: Recent Labs  Lab 06/14/18 2116 06/15/18 0652 06/16/18 0926  NA 139 140 138  K 5.8* 4.7 5.8*  CL 96* 98 95*  CO2 18* 18* 18*  GLUCOSE 83 83 73  BUN 84* 91* 105*  CREATININE 22.29* 23.51* 25.42*  CALCIUM 8.0* 8.0* 8.2*  PHOS  --  6.9*  --    Liver Function Tests: Recent Labs  Lab 06/15/18 0652  ALBUMIN 3.6   CBC: Recent Labs  Lab 06/14/18 1944 06/14/18 1957 06/16/18 1954  WBC 5.8  --  5.2  NEUTROABS 3.7  --   --   HGB 13.1 14.3 12.0*  HCT 41.2 42.0 36.9*  MCV 97.2  --  94.9  PLT 237  --  249   Blood Culture    Component Value Date/Time   SDES NASAL SWAB 08/26/2015 2234   SPECREQUEST NONE 08/26/2015 2234   CULT NOMRSA Performed at [facility]  08/26/2015 2234   REPTSTATUS 08/28/2015 FINAL 08/26/2015 2234   CBG: Recent Labs  Lab 06/17/18 0024 06/17/18 0142  GLUCAP 65* 158*    Lab Results  Component Value Date   INR 1.02 10/15/2016   INR 0.98 07/15/2016   INR 1.05 07/27/2015   Studies/Results: Ir  Thrombectomy Av Fistula/w Thrombolysis/pta Inc Shunt/img Right  Result Date: 06/16/2018 INDICATION: Clotted graft. End-stage renal disease, currently receiving dialysis via right thigh dialysis graft. Patient's dialysis graft has been used for the past 1 year. Patient denies previous intervention or thrombosis of the right thigh dialysis graft. EXAM: 1. FISTULALYSIS 2. ANGIOPLASTY OF VENOUS LIMB AND VENOUS ANASTOMOSIS 3. ULTRASOUND GUIDANCE FOR VENOUS ACCESS COMPARISON:  None. MEDICATIONS: Heparin 4000 units IV; TPA 4 mg into graft. CONTRAST:  40 cc Isovue-300 ANESTHESIA/SEDATION: Moderate (conscious) sedation was employed during this procedure. A total of Versed 4 mg and Fentanyl 100 mcg was administered intravenously. Moderate Sedation Time: 28 minutes. The patient's level of consciousness and vital signs were monitored continuously by radiology nursing throughout the procedure under my direct supervision. FLUOROSCOPY TIME:  4 minutes 36 seconds (48 mGy) COMPLICATIONS: None immediate. TECHNIQUE: Informed written consent was obtained from the patient after a discussion of the risks, benefits and alternatives to treatment. Questions regarding the procedure were encouraged and answered. A timeout was performed prior to the initiation of the procedure. On physical examination, the existing right thigh dialysis graft was negative for  palpable pulse or thrill. The skin overlying the graft was prepped and draped in the usual sterile fashion, and a sterile drape was applied covering the operative field. Maximum barrier sterile technique with sterile gowns and gloves were used for the procedure. Under ultrasound guidance, the dialysis graft was accessed directed towards the venous anastomosis with a micropuncture kit after the overlying soft tissues were anesthetized with 1% lidocaine. An ultrasound image was saved for documentation purposes. The micropuncture sheath was exchange for a 7-French vascular sheath over a  guidewire. Over a Benson wire, a Kumpe catheter was advanced centrally and a central venogram was performed. Pull back venogram was performed with the Kumpe catheter. Heparin was administered systemically and TPA was administered via the Kumpe catheter throughout near the entirety of the venous limb. The venous anastomosis and majority of the venous limb was angioplastied with a 6 mm x 4 cm Conquest balloon. An additional access was obtained directed towards the arterial anastomoses with a micropuncture kit after the overlying soft tissues anesthetized with 1% lidocaine. This allowed for placement of a 6-French vascular sheath. The graft was thrombectomized with several rounds of push-pull mechanical thrombectomy with an occlusion balloon. Flow was restored to the graft as evidenced by restored pulsatility of the graft and brisk blood return from the side arm of the vascular sheath. Shuntograms were performed. At this point, the procedure was terminated. All wires, catheters and sheaths were removed from the patient. Hemostasis was achieved at both access sites with deployment of a swizzle sutures which will be removed at the patient's next dialysis session. Dressings were placed. The patient tolerated the procedure without immediate postprocedural complication. FINDINGS: The existing right thigh AV graft is thrombosed. The venous anastomosis and near the entirety of the venous limb was angioplastied to 6 mm diameter. The graft was successfully thrombectomized using mechanical and pharmacologic means as above. Completion shuntogram demonstrates the venous limb and anastomosis are widely patent. 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 The central venous system, arterial limb and arterial anastomosis are widely patent. IMPRESSION: 1. Technically successful right thigh AV graft thrombolysis. 2. Successful angioplasty of the venous anastomosis  and majority of the venous limb to 6 mm diameter. Completion shuntogram demonstrates the venous limb and anastomosis are widely patent. 3. The arterial anastomosis and arterial limb are widely patent. 4. The central venous system is widely patent. ACCESS: This graft would be amenable to future percutaneous intervention as clinically indicated. Electronically Signed   By: [name] M.D.   On: 06/16/2018 15:35    Medications: . sodium chloride     . calcium carbonate  8 tablet Oral TID WC  . Chlorhexidine Gluconate Cloth  6 each Topical Q0600  . heparin  5,000 Units Subcutaneous Q8H  . sodium chloride flush  3 mL Intravenous Q12H  . sodium chloride flush  3 mL Intravenous Q12H    Dialysis Orders: MWF -NW 3.75hrs, BFR450, KGY185, EDW 77.5kg,2K/2.5Ca, P4, linear sodium  Access:R thigh AVG Heparin1500 units  Assessment/Plan: 1. Thrombosed AV graft -s/p successful declot yesterday but appears to have clotted again today.  IR consulted for possible thrombolysis today.  Needs shuntogram, ? angioplasty 2. ESRD - MWFschedule. HD yesterday.  Will write orders for tomorrow in care remains inpatient.  3. Hypertension/volume - BP well controlled. Net UF 2L removed. Episode of hypotension during HD yesterday. No antihypertensives as OP.? May need Mido, use ASA 4. Anemia  of CKD - Hgb 12.0. No indication for ESAat this time.  5. Secondary Hyperparathyroidism - S/p parathyroidectomy. Ca in goal. Phos^, continue Ca carbonate binders.  6. Nutrition - Alb 3.6, renal diet with fluid restrictions.  [place], [facility] Pager: [phone] 06/17/2018,9:39 AM  LOS: 1 day  I have seen and examined this patient and agree with the plan of care seen, eval, examined, counseled, discussed with PA, primary. Jeneen Rinks Taimi Towe 06/17/2018, 10:57 AM

## 2018-06-17 NOTE — Progress Notes (Signed)
Received call from Linton Flemings, RN that patient was finished with dialysis and would be returning to the unit.

## 2018-06-17 NOTE — Progress Notes (Signed)
Patient having blood oozing from medial femoral site. Gentle pressure applied.  Dr. Pascal Lux and Dr. Candiss Norse notified. Dr. Pascal Lux will follow-up with [place] to ensure bleeding subsides.

## 2018-06-17 NOTE — Discharge Summary (Addendum)
[name] UKG:[id number] DOB: 03/08/1971 DOA: 06/14/2018  PCP: Patient, No Pcp Per  Admit date: 06/14/2018  Discharge date: 06/20/2018 @ 2pm  Admitted From: Home   Disposition:  Home   Recommendations for Outpatient Follow-up:   Follow up with PCP in 1-2 weeks  PCP Please obtain BMP/CBC, 2 view CXR in 1week,  (see Discharge instructions)   PCP Please follow up on the following pending results:     Home Health: None Equipment/Devices: None  Consultations: Renal, IR Discharge Condition: Satble   CODE STATUS: Full   Diet Recommendation: Renal, 1.5 L/day total fluid restriction   Chief Complaint  Patient presents with  . Vascular Access Problem     Brief history of present illness from the day of admission and additional interim summary    Patient is a 47 y.o. male with history of ESRD-ED by his outpatient dialysis center for malfunction of his dialysis access .  He was subsequently admitted for further evaluation and treatment.  He scheduled for declot by IR on 9/14.                                                                 Hospital Course    Problem with vascular access for HD: With clotting of the right thigh AV graft, underwent declotting procedure on 06/16/2018 followed by successful dialysis session on same date, unfortunately his graft has again re-clotted, IR was reconsulted and he underwent a second declotting procedure on 7/62/8315 complicated by mild postop bleeding which resolved after pressure application, unfortunately it clotted for a 3rd time and [facility] took him tot he OR on the 17th for a revision with good results,  his graft is working well this morning, he will be dialyzed thereafter discharged home. He is symptom-free. Note we are trying to avoid Hypotension during HD sessions  which could have caused the clots and hence midodrine TID has been added to his Meds.  ESRD: Usual schedule for HD is MWF, renal was on board will be dialyzed today and then discharged if stable.  Mild hyperkalemia: Resolved with HD   Discharge diagnosis     Principal Problem:   Problem with vascular access Active Problems:   ESRD (end stage renal disease) on dialysis (HCC)   Hyperkalemia   Problem with dialysis access [facility])    Discharge instructions    Discharge Instructions    Discharge instructions   Complete by:  As directed    Follow with Primary MD  in 7 days   Get CBC, BMP  checked  by Primary MD in 5-7 days   Activity: As tolerated with Full fall precautions use walker/cane & assistance as needed  Disposition Home    Diet:   Renal diet, 1.5lit/day fluid restriction  For Heart failure patients - Check  your Weight same time everyday, if you gain over 2 pounds, or you develop in leg swelling, experience more shortness of breath or chest pain, call your Primary MD immediately. Follow Cardiac Low Salt Diet and 1.5 lit/day fluid restriction.  Special Instructions: If you have smoked or chewed Tobacco  in the last 2 yrs please stop smoking, stop any regular Alcohol  and or any Recreational drug use.  On your next visit with your primary care physician please Get Medicines reviewed and adjusted.  Please request your Prim.MD to go over all Hospital Tests and Procedure/Radiological results at the follow up, please get all Hospital records sent to your Prim MD by signing hospital release before you go home.  If you experience worsening of your admission symptoms, develop shortness of breath, life threatening emergency, suicidal or homicidal thoughts you must seek medical attention immediately by calling 911 or calling your MD immediately  if symptoms less severe.  You Must read complete instructions/literature along with all the possible adverse reactions/side effects for  all the Medicines you take and that have been prescribed to you. Take any new Medicines after you have completely understood and accpet all the possible adverse reactions/side effects.   Increase activity slowly   Complete by:  As directed       Discharge Medications   Allergies as of 06/20/2018      Reactions   Lisinopril Cough   Ivp Dye [iodinated Diagnostic Agents] Swelling   SWELLING REACTION UNSPECIFIED    Solu-medrol [methylprednisolone] Nausea And Vomiting      Medication List    TAKE these medications   acetaminophen 500 MG tablet Commonly known as:  TYLENOL Take 1,000 mg by mouth daily as needed (for pain or headaches).   benzonatate 100 MG capsule Commonly known as:  TESSALON Take 1 capsule (100 mg total) by mouth every 8 (eight) hours.   fluticasone 50 MCG/ACT nasal spray Commonly known as:  FLONASE Place 2 sprays into both nostrils daily.   midodrine 10 MG tablet Commonly known as:  PROAMATINE Take 1 tablet (10 mg total) by mouth 3 (three) times daily with meals.   oxyCODONE-acetaminophen 5-325 MG tablet Commonly known as:  PERCOCET/ROXICET Take 1 tablet by mouth every 4 (four) hours as needed for moderate pain.   TUMS ULTRA 1000 400 MG chewable tablet Generic drug:  calcium elemental as carbonate Chew 2,000-4,000 mg by mouth See admin instructions. Chew 4 tablets (4,000 mg) by mouth 3 times daily with meals and 2 tablets (2,000 mg) with snacks Notes to patient:  06/18/18 at 1:00 pm       Follow-up Information    PCP or your Kidney Doctor. Schedule an appointment as soon as possible for a visit in 1 week(s).        [place]. Go on 07/03/2018.   Why:  3:30 pm, hosptal follow up appointment Contact information: [place] [address] [phone]          Major procedures and Radiology Reports - PLEASE review detailed and final reports thoroughly  -        Ir US Guide Vasc  Access Right  Result Date: 06/18/2018 Study Result INDICATION: Clotted graft.  End-stage renal disease, currently receiving dialysis via right thigh dialysis graft.  Patient underwent technically successful 5 dialysis graft yesterday, 06/16/2018, and while receiving a complete dialysis session after graft lysis, the patient states he experienced a transient episode of hypotension which has resulted  in early graft rethrombosis.  As such, patient presents today for repeat attempted graft thrombolysis.  EXAM: 1. FISTULALYSIS 2. ANGIOPLASTY OF VENOUS LIMB AND VENOUS ANASTOMOSIS 3. ULTRASOUND GUIDANCE FOR VENOUS ACCESS  COMPARISON:  Declot-06/16/2018  MEDICATIONS: Heparin 4000 units IV; TPA 4 mg into graft.  CONTRAST:  50 cc Isovue-300  ANESTHESIA/SEDATION: Moderate (conscious) sedation was employed during this procedure. A total of Versed 5 mg and Fentanyl 200 mcg was administered intravenously.  Moderate Sedation Time: 42 minutes. The patient's level of consciousness and vital signs were monitored continuously by radiology nursing throughout the procedure under my direct supervision.  FLUOROSCOPY TIME:  4 minutes, 12 seconds (44 mGy)  COMPLICATIONS: None immediate.  TECHNIQUE: Informed written consent was obtained from the patient after a discussion of the risks, benefits and alternatives to treatment. Questions regarding the procedure were encouraged and answered. A timeout was performed prior to the initiation of the procedure.  On physical examination, the existing right thigh dialysis graft was negative for palpable pulse or thrill. The skin overlying the graft was prepped and draped in the usual sterile fashion, and a sterile drape was applied covering the operative field. Maximum barrier sterile technique with sterile gowns and gloves were used for the procedure.  Under ultrasound guidance, the dialysis graft was accessed directed towards the venous anastomosis with a micropuncture kit after the  overlying soft tissues were anesthetized with 1% lidocaine. An ultrasound image was saved for documentation purposes. The micropuncture sheath was exchange for a 7-French vascular sheath over a guidewire. Over a Benson wire, a Kumpe catheter was advanced centrally and a central venogram was performed. Pull back venogram was performed with the Kumpe catheter. Heparin was administered systemically and TPA was administered via the Kumpe catheter throughout near the entirety of the venous limb.  The venous anastomosis and majority of the venous limb was angioplastied with a 6 mm x 4 cm Conquest balloon. An additional access was obtained directed towards the arterial anastomoses with a micropuncture kit after the overlying soft tissues anesthetized with 1% lidocaine. This allowed for placement of a 6-French vascular sheath. The graft was thrombectomized with several rounds of push-pull mechanical thrombectomy with an occlusion balloon. Flow was restored to the graft as evidenced by restored pulsatility of the graft and brisk blood return from the side arm of the vascular sheath. Shuntograms were performed.  Repeat angioplasty was performed with the 6 mm x 4 cm Conquest balloon at 2 sites of extravasation within the stick zone of the graft, one of which appear to communicate with a draining superficial vein.  Completion images were obtained and the procedure was terminated.  At this point, the procedure was terminated. All wires, catheters and sheaths were removed from the patient. Hemostasis was achieved at both access sites with deployment of a swizzle sutures which will be removed at the patient's next dialysis session. Dressings were placed. The patient tolerated the procedure without immediate postprocedural complication.  FINDINGS: The existing right thigh AV graft is thrombosed. The venous anastomosis and near the entirety of the venous limb was angioplastied to 6 mm diameter. The graft was successfully  thrombectomized using mechanical and pharmacologic means as above.  Prolonged angioplasty was performed at 2 small areas of extravasation within the stick zone of the graft, 1 of which appear to communicate with a draining superficial vein.  Completion shuntogram demonstrates the venous limb and anastomosis are widely patent with tiny areas of extravasation at previous dialysis graft access sites which was controlled  with additional manual compression. 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  The central venous system, arterial limb and arterial anastomosis are widely patent.  IMPRESSION: 1. Technically successful right thigh AV graft thrombolysis. 2. Successful angioplasty of the venous anastomosis and majority of the venous limb to 6 mm diameter. Completion shuntogram demonstrates the venous limb and anastomosis are widely patent. 3. The arterial anastomosis and arterial limb are widely patent. 4. The central venous system is widely patent.  ACCESS: Given early graft thrombosis (patient underwent technically successful graft declot procedure day prior on 06/16/2018), would recommend surgical revision if the graft were to rethrombosis within the next 4-6 weeks.   Electronically Signed   By: [name] M.D.   On: 06/17/2018 18:40    Ir US Guide Vasc Access Right  Result Date: 06/17/2018 INDICATION: Clotted graft. End-stage renal disease, currently receiving dialysis via right thigh dialysis graft. Patient underwent technically successful 5 dialysis graft yesterday, 06/16/2018, and while receiving a complete dialysis session after graft lysis, the patient states he experienced a transient episode of hypotension which has resulted in early graft rethrombosis. As such, patient presents today for repeat attempted graft thrombolysis. EXAM: 1. FISTULALYSIS 2. ANGIOPLASTY OF VENOUS LIMB AND VENOUS ANASTOMOSIS 3.  ULTRASOUND GUIDANCE FOR VENOUS ACCESS COMPARISON:  Declot-06/16/2018 MEDICATIONS: Heparin 4000 units IV; TPA 4 mg into graft. CONTRAST:  50 cc Isovue-300 ANESTHESIA/SEDATION: Moderate (conscious) sedation was employed during this procedure. A total of Versed 5 mg and Fentanyl 200 mcg was administered intravenously. Moderate Sedation Time: 42 minutes. The patient's level of consciousness and vital signs were monitored continuously by radiology nursing throughout the procedure under my direct supervision. FLUOROSCOPY TIME:  4 minutes, 12 seconds (44 mGy) COMPLICATIONS: None immediate. TECHNIQUE: Informed written consent was obtained from the patient after a discussion of the risks, benefits and alternatives to treatment. Questions regarding the procedure were encouraged and answered. A timeout was performed prior to the initiation of the procedure. On physical examination, the existing right thigh dialysis graft was negative for palpable pulse or thrill. The skin overlying the graft was prepped and draped in the usual sterile fashion, and a sterile drape was applied covering the operative field. Maximum barrier sterile technique with sterile gowns and gloves were used for the procedure. Under ultrasound guidance, the dialysis graft was accessed directed towards the venous anastomosis with a micropuncture kit after the overlying soft tissues were anesthetized with 1% lidocaine. An ultrasound image was saved for documentation purposes. The micropuncture sheath was exchange for a 7-French vascular sheath over a guidewire. Over a Benson wire, a Kumpe catheter was advanced centrally and a central venogram was performed. Pull back venogram was performed with the Kumpe catheter. Heparin was administered systemically and TPA was administered via the Kumpe catheter throughout near the entirety of the venous limb. The venous anastomosis and majority of the venous limb was angioplastied with a 6 mm x 4 cm Conquest balloon. An  additional access was obtained directed towards the arterial anastomoses with a micropuncture kit after the overlying soft tissues anesthetized with 1% lidocaine. This allowed for placement of a 6-French vascular sheath. The graft was thrombectomized with several rounds of push-pull mechanical thrombectomy with an occlusion balloon. Flow was restored to the graft as evidenced by restored pulsatility of the graft and brisk blood return from the side arm of the vascular sheath. Shuntograms were performed. Repeat angioplasty  was performed with the 6 mm x 4 cm Conquest balloon at 2 sites of extravasation within the stick zone of the graft, one of which appear to communicate with a draining superficial vein. Completion images were obtained and the procedure was terminated. At this point, the procedure was terminated. All wires, catheters and sheaths were removed from the patient. Hemostasis was achieved at both access sites with deployment of a swizzle sutures which will be removed at the patient's next dialysis session. Dressings were placed. The patient tolerated the procedure without immediate postprocedural complication. FINDINGS: The existing right thigh AV graft is thrombosed. The venous anastomosis and near the entirety of the venous limb was angioplastied to 6 mm diameter. The graft was successfully thrombectomized using mechanical and pharmacologic means as above. Prolonged angioplasty was performed at 2 small areas of extravasation within the stick zone of the graft, 1 of which appear to communicate with a draining superficial vein. Completion shuntogram demonstrates the venous limb and anastomosis are widely patent with tiny areas of extravasation at previous dialysis graft access sites which was controlled with additional manual compression. 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 The central venous system,  arterial limb and arterial anastomosis are widely patent. IMPRESSION: 1. Technically successful right thigh AV graft thrombolysis. 2. Successful angioplasty of the venous anastomosis and majority of the venous limb to 6 mm diameter. Completion shuntogram demonstrates the venous limb and anastomosis are widely patent. 3. The arterial anastomosis and arterial limb are widely patent. 4. The central venous system is widely patent. ACCESS: Given early graft thrombosis (patient underwent technically successful graft declot procedure day prior on 06/16/2018), would recommend surgical revision if the graft were to rethrombosis within the next 4-6 weeks. Electronically Signed   By: [name] M.D.   On: 06/17/2018 18:40   Ir Thrombectomy Av Fistula/w Thrombolysis/pta Inc Shunt/img Right  Result Date: 06/17/2018 INDICATION: Clotted graft. End-stage renal disease, currently receiving dialysis via right thigh dialysis graft. Patient underwent technically successful 5 dialysis graft yesterday, 06/16/2018, and while receiving a complete dialysis session after graft lysis, the patient states he experienced a transient episode of hypotension which has resulted in early graft rethrombosis. As such, patient presents today for repeat attempted graft thrombolysis. EXAM: 1. FISTULALYSIS 2. ANGIOPLASTY OF VENOUS LIMB AND VENOUS ANASTOMOSIS 3. ULTRASOUND GUIDANCE FOR VENOUS ACCESS COMPARISON:  Declot-06/16/2018 MEDICATIONS: Heparin 4000 units IV; TPA 4 mg into graft. CONTRAST:  50 cc Isovue-300 ANESTHESIA/SEDATION: Moderate (conscious) sedation was employed during this procedure. A total of Versed 5 mg and Fentanyl 200 mcg was administered intravenously. Moderate Sedation Time: 42 minutes. The patient's level of consciousness and vital signs were monitored continuously by radiology nursing throughout the procedure under my direct supervision. FLUOROSCOPY TIME:  4 minutes, 12 seconds (44 mGy) COMPLICATIONS: None immediate. TECHNIQUE:  Informed written consent was obtained from the patient after a discussion of the risks, benefits and alternatives to treatment. Questions regarding the procedure were encouraged and answered. A timeout was performed prior to the initiation of the procedure. On physical examination, the existing right thigh dialysis graft was negative for palpable pulse or thrill. The skin overlying the graft was prepped and draped in the usual sterile fashion, and a sterile drape was applied covering the operative field. Maximum barrier sterile technique with sterile gowns and gloves were used for the procedure. Under ultrasound guidance, the dialysis graft was accessed directed  towards the venous anastomosis with a micropuncture kit after the overlying soft tissues were anesthetized with 1% lidocaine. An ultrasound image was saved for documentation purposes. The micropuncture sheath was exchange for a 7-French vascular sheath over a guidewire. Over a Benson wire, a Kumpe catheter was advanced centrally and a central venogram was performed. Pull back venogram was performed with the Kumpe catheter. Heparin was administered systemically and TPA was administered via the Kumpe catheter throughout near the entirety of the venous limb. The venous anastomosis and majority of the venous limb was angioplastied with a 6 mm x 4 cm Conquest balloon. An additional access was obtained directed towards the arterial anastomoses with a micropuncture kit after the overlying soft tissues anesthetized with 1% lidocaine. This allowed for placement of a 6-French vascular sheath. The graft was thrombectomized with several rounds of push-pull mechanical thrombectomy with an occlusion balloon. Flow was restored to the graft as evidenced by restored pulsatility of the graft and brisk blood return from the side arm of the vascular sheath. Shuntograms were performed. Repeat angioplasty was performed with the 6 mm x 4 cm Conquest balloon at 2 sites of  extravasation within the stick zone of the graft, one of which appear to communicate with a draining superficial vein. Completion images were obtained and the procedure was terminated. At this point, the procedure was terminated. All wires, catheters and sheaths were removed from the patient. Hemostasis was achieved at both access sites with deployment of a swizzle sutures which will be removed at the patient's next dialysis session. Dressings were placed. The patient tolerated the procedure without immediate postprocedural complication. FINDINGS: The existing right thigh AV graft is thrombosed. The venous anastomosis and near the entirety of the venous limb was angioplastied to 6 mm diameter. The graft was successfully thrombectomized using mechanical and pharmacologic means as above. Prolonged angioplasty was performed at 2 small areas of extravasation within the stick zone of the graft, 1 of which appear to communicate with a draining superficial vein. Completion shuntogram demonstrates the venous limb and anastomosis are widely patent with tiny areas of extravasation at previous dialysis graft access sites which was controlled with additional manual compression. 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 The central venous system, arterial limb and arterial anastomosis are widely patent. IMPRESSION: 1. Technically successful right thigh AV graft thrombolysis. 2. Successful angioplasty of the venous anastomosis and majority of the venous limb to 6 mm diameter. Completion shuntogram demonstrates the venous limb and anastomosis are widely patent. 3. The arterial anastomosis and arterial limb are widely patent. 4. The central venous system is widely patent. ACCESS: Given early graft thrombosis (patient underwent technically successful graft declot procedure day prior on 06/16/2018), would recommend surgical revision if the  graft were to rethrombosis within the next 4-6 weeks. Electronically Signed   By: [name] M.D.   On: 06/17/2018 18:40   Ir Thrombectomy Av Fistula/w Thrombolysis/pta Inc Shunt/img Right  Result Date: 06/16/2018 INDICATION: Clotted graft. End-stage renal disease, currently receiving dialysis via right thigh dialysis graft. Patient's dialysis graft has been used for the past 1 year. Patient denies previous intervention or thrombosis of the right thigh dialysis graft. EXAM: 1. FISTULALYSIS 2. ANGIOPLASTY OF VENOUS LIMB AND VENOUS ANASTOMOSIS 3. ULTRASOUND GUIDANCE FOR VENOUS ACCESS COMPARISON:  None. MEDICATIONS: Heparin 4000 units IV; TPA 4 mg into graft. CONTRAST:  40 cc Isovue-300 ANESTHESIA/SEDATION: Moderate (conscious)  sedation was employed during this procedure. A total of Versed 4 mg and Fentanyl 100 mcg was administered intravenously. Moderate Sedation Time: 28 minutes. The patient's level of consciousness and vital signs were monitored continuously by radiology nursing throughout the procedure under my direct supervision. FLUOROSCOPY TIME:  4 minutes 36 seconds (48 mGy) COMPLICATIONS: None immediate. TECHNIQUE: Informed written consent was obtained from the patient after a discussion of the risks, benefits and alternatives to treatment. Questions regarding the procedure were encouraged and answered. A timeout was performed prior to the initiation of the procedure. On physical examination, the existing right thigh dialysis graft was negative for palpable pulse or thrill. The skin overlying the graft was prepped and draped in the usual sterile fashion, and a sterile drape was applied covering the operative field. Maximum barrier sterile technique with sterile gowns and gloves were used for the procedure. Under ultrasound guidance, the dialysis graft was accessed directed towards the venous anastomosis with a micropuncture kit after the overlying soft tissues were anesthetized with 1% lidocaine. An  ultrasound image was saved for documentation purposes. The micropuncture sheath was exchange for a 7-French vascular sheath over a guidewire. Over a Benson wire, a Kumpe catheter was advanced centrally and a central venogram was performed. Pull back venogram was performed with the Kumpe catheter. Heparin was administered systemically and TPA was administered via the Kumpe catheter throughout near the entirety of the venous limb. The venous anastomosis and majority of the venous limb was angioplastied with a 6 mm x 4 cm Conquest balloon. An additional access was obtained directed towards the arterial anastomoses with a micropuncture kit after the overlying soft tissues anesthetized with 1% lidocaine. This allowed for placement of a 6-French vascular sheath. The graft was thrombectomized with several rounds of push-pull mechanical thrombectomy with an occlusion balloon. Flow was restored to the graft as evidenced by restored pulsatility of the graft and brisk blood return from the side arm of the vascular sheath. Shuntograms were performed. At this point, the procedure was terminated. All wires, catheters and sheaths were removed from the patient. Hemostasis was achieved at both access sites with deployment of a swizzle sutures which will be removed at the patient's next dialysis session. Dressings were placed. The patient tolerated the procedure without immediate postprocedural complication. FINDINGS: The existing right thigh AV graft is thrombosed. The venous anastomosis and near the entirety of the venous limb was angioplastied to 6 mm diameter. The graft was successfully thrombectomized using mechanical and pharmacologic means as above. Completion shuntogram demonstrates the venous limb and anastomosis are widely patent. 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 The central venous system, arterial limb and arterial anastomosis are widely  patent. IMPRESSION: 1. Technically successful right thigh AV graft thrombolysis. 2. Successful angioplasty of the venous anastomosis and majority of the venous limb to 6 mm diameter. Completion shuntogram demonstrates the venous limb and anastomosis are widely patent. 3. The arterial anastomosis and arterial limb are widely patent. 4. The central venous system is widely patent. ACCESS: This graft would be amenable to future percutaneous intervention as clinically indicated. Electronically Signed   By: [name] M.D.   On: 06/16/2018 15:35    Micro Results     Recent Results (from the past 240 hour(s))  MRSA PCR Screening     Status: None   Collection Time: 06/15/18  3:28 AM  Result Value Ref Range Status   MRSA by  PCR NEGATIVE NEGATIVE Final    Comment:        The GeneXpert MRSA Assay (FDA approved for NASAL specimens only), is one component of a comprehensive MRSA colonization surveillance program. It is not intended to diagnose MRSA infection nor to guide or monitor treatment for MRSA infections. Performed at [facility], [place] [address] [place], [place] [address]     Today   Subjective    [name] today has no headache,no chest abdominal pain,no new weakness tingling or numbness, feels much better wants to go home today.     Objective   Blood pressure 107/73, pulse 77, temperature 97.6 F (36.4 C), temperature source Oral, resp. rate 16, height 6' (1.829 m), weight 80.4 kg, SpO2 100 %.   Intake/Output Summary (Last 24 hours) at 06/20/2018 0852 Last data filed at 06/19/2018 1645 Gross per 24 hour  Intake 1300 ml  Output 50 ml  Net 1250 ml    Exam Awake Alert, Oriented x 3, No new F.N deficits, Normal affect [place].AT,PERRAL Supple Neck,No JVD, No cervical lymphadenopathy appriciated.  Symmetrical Chest wall movement, Good air movement bilaterally, CTAB RRR,No Gallops,Rubs or new Murmurs, No Parasternal Heave +ve B.Sounds, Abd Soft, Non tender, No  organomegaly appriciated, No rebound -guarding or rigidity. No Cyanosis, Clubbing or edema, No new Rash or bruise, right thigh graft site appears stable with palpable/audible thrill   Data Review   CBC w Diff:  Lab Results  Component Value Date   WBC 5.5 06/19/2018   HGB 12.8 (L) 06/19/2018   HCT 39.4 06/19/2018   PLT 203 06/19/2018   LYMPHOPCT 21 06/14/2018   MONOPCT 13 06/14/2018   EOSPCT 1 06/14/2018   BASOPCT 1 06/14/2018    CMP:  Lab Results  Component Value Date   NA 135 06/19/2018   K 5.2 (H) 06/19/2018   CL 94 (L) 06/19/2018   CO2 23 06/19/2018   BUN 35 (H) 06/19/2018   CREATININE 13.80 (H) 06/19/2018   PROT 6.6 08/09/2017   ALBUMIN 3.3 (L) 06/18/2018   BILITOT 0.7 08/09/2017   ALKPHOS 71 08/09/2017   AST 14 (L) 08/09/2017   ALT 11 (L) 08/09/2017  .   Total Time in preparing paper work, data evaluation and todays exam - 6 minutes  Lala Lund M.D on 06/20/2018 at 8:52 AM  [facility]   Office  [phone]

## 2018-06-17 NOTE — Progress Notes (Signed)
Hypoglycemic Event  CBG: 65  Treatment: 15 GM carbohydrate snack  Symptoms: None  Follow-up CBG: Time:0142 CBG Result:158  Possible Reasons for Event: Inadequate meal intake--Patient had not eaten all day due to having vascular procedure  Comments/MD notified:[place],NP notified. No new orders placed. Will continue to monitor and treat per MD orders.    [place]

## 2018-06-17 NOTE — Procedures (Signed)
Pre procedural Dx: ESRD; early re-thrombosis of right thigh dialysis graft. Post procedural Dx: Same.   Technically successful declot of right thigh graft.   Access is ready for immediate use.  EBL: Trace  Complications: None immediate   Ronny Bacon, MD Pager #: [phone]

## 2018-06-17 NOTE — Consult Note (Signed)
Chief Complaint: Patient was seen in consultation today for ESRD on HD and dysfunction of surgical created AVF.  Referring Physician(s): Thurnell Lose  Supervising Physician: [name]  Patient Status: [facility] - In-pt  History of Present Illness: [name] is a 47 y.o. male with a past medical history of hypertension, headaches, pneumonia, thyroid disease, ESRD on HD, anemia, depression, and anxiety. He was admitted to [facility] 06/14/2018 for vascular access problem. He underwent thrombolysis and agioplasty of his right thigh AVF 06/16/2018 with Dr. Pascal Lux. Unfortunately, his AVF appears to have clotted again today.  IR requested by Dr. Candiss Norse for possible image-guided right thigh thrombolysis and possible angioplasty/stent placement. Patient awake and alert sitting in chair with no complaints at this time. Denies fever, chills, chest pain, dyspnea, abdominal pain, or dizziness.   Past Medical History:  Diagnosis Date  . Anemia   . Anxiety   . Cough    DRY    . Depression   . ESRD on hemodialysis ([place])    HD [place] [facility] MWF  . Headache(784.0)   . Hypertension   . Muscle spasms of neck    BACK, NECK  . Pneumonia 2015ish  . Thyroid disease     Past Surgical History:  Procedure Laterality Date  . ARTERIOVENOUS GRAFT PLACEMENT Left    "forearm, it's not working; thigh"   . ARTERIOVENOUS GRAFT PLACEMENT Left 11/09/2015  . AV FISTULA PLACEMENT Right 01/10/2017   Procedure: INSERTION OF ARTERIOVENOUS Right thigh GORE-TEX GRAFT;  Surgeon: [name], MD;  Location: [place];  Service: Vascular;  Laterality: Right;  . FALSE ANEURYSM REPAIR Left 11/09/2015   Procedure: REPAIR OF LEFT FEMORAL PSEUDOANEURYSM; REVISION  OF LEFT THIGH ARTERIOVENOUS GRAFT USING 6MM X 10 CM GORETEX GRAFT ;  Surgeon: [name], MD;  Location: [place];  Service: Vascular;  Laterality: Left;  . IR GENERIC HISTORICAL  07/15/2016   IR US GUIDE VASC ACCESS LEFT 07/15/2016 [name], MD  [facility]  . IR GENERIC HISTORICAL Left 07/15/2016   IR THROMBECTOMY AV FISTULA W/THROMBOLYSIS/PTA INC/SHUNT/IMG LEFT 07/15/2016 [name], MD [facility]  . IR GENERIC HISTORICAL  10/05/2016   IR US GUIDE VASC ACCESS LEFT 10/05/2016 [name], MD [facility]  . IR GENERIC HISTORICAL Left 10/05/2016   IR THROMBECTOMY AV FISTULA W/THROMBOLYSIS/PTA INC/SHUNT/IMG LEFT 10/05/2016 [name], MD [facility]  . IR GENERIC HISTORICAL  10/08/2016   IR FLUORO GUIDE CV LINE RIGHT 10/08/2016 [name], MD [facility]  . IR GENERIC HISTORICAL  10/08/2016   IR US GUIDE VASC ACCESS RIGHT 10/08/2016 [name], MD [facility]  . IR REMOVAL TUN CV CATH W/O FL  03/16/2017  . IR THROMBECTOMY AV FISTULA W/THROMBOLYSIS/PTA INC/SHUNT/IMG RIGHT Right 06/16/2018  . PARATHYROIDECTOMY N/A 04/24/2014   Procedure: TOTAL 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;  Surgeon: [name], MD;  Location: [place];  Service: General;  Laterality: N/A;  NECK AND LEFT FOREARM  . PERIPHERAL VASCULAR CATHETERIZATION N/A 05/05/2016   Procedure: A/V Shuntogram;  Surgeon: Conrad Pipestone, MD;  Location: [facility];  Service: Cardiovascular;  Laterality: N/A;  . PERIPHERAL VASCULAR CATHETERIZATION Left 05/05/2016   Procedure: Peripheral Vascular Balloon Angioplasty;  Surgeon: Conrad [place], MD;  Location: [place] [facility];  Service: Cardiovascular;  Laterality: Left;  . PSEUDOANEURYSM REPAIR Left 11/09/2015  . THROMBECTOMY / ARTERIOVENOUS GRAFT REVISION Left 08/18/2015   thigh  . THROMBECTOMY AND REVISION OF ARTERIOVENTOUS (AV) GORETEX  GRAFT Left 08/23/2015  Procedure: THROMBECTOMY AND REVISION OF ARTERIOVENTOUS (AV) GORETEX  GRAFT LEFT THIGH ;  Surgeon: [name], MD;  Location: [place];  Service: Vascular;  Laterality: Left;  . THROMBECTOMY W/ EMBOLECTOMY Left 08/18/2015   Procedure: THROMBECTOMY  AND REVISION ARTERIOVENOUS GORE-TEX GRAFT/LEFT THIGH;  Surgeon: [name], MD;  Location: [place];   Service: Vascular;  Laterality: Left;  . UPPER EXTREMITY VENOGRAPHY Bilateral 12/27/2016   Procedure: Upper Extremity Venography;  Surgeon: [name], MD;  Location: [place] [facility];  Service: Cardiovascular;  Laterality: Bilateral;  . VENOGRAM Left 05/05/2016   Procedure: Venogram;  Surgeon: Conrad Bier, MD;  Location: [place] [facility];  Service: Cardiovascular;  Laterality: Left;  lower extremity    Allergies: Lisinopril; Ivp dye [iodinated diagnostic agents]; and Solu-medrol [methylprednisolone]  Medications: Prior to Admission medications   Medication Sig Start Date End Date Taking? Authorizing Provider  acetaminophen (TYLENOL) 500 MG tablet Take 1,000 mg by mouth daily as needed (for pain or headaches).    Yes [provider]  calcium elemental as carbonate (TUMS ULTRA 1000) 400 MG chewable tablet Chew 2,000-4,000 mg by mouth See admin instructions. Chew 4 tablets (4,000 mg) by mouth 3 times daily with meals and 2 tablets (2,000 mg) with snacks   Yes [provider]  benzonatate (TESSALON) 100 MG capsule Take 1 capsule (100 mg total) by mouth every 8 (eight) hours. Patient not taking: Reported on 06/15/2018 07/25/17   Larene Pickett, PA-C  fluticasone [facility]) 50 MCG/ACT nasal spray Place 2 sprays into both nostrils daily. Patient not taking: Reported on 06/15/2018 07/25/17   Larene Pickett, PA-C  oxyCODONE-acetaminophen (PERCOCET/ROXICET) 5-325 MG tablet Take 1 tablet by mouth every 4 (four) hours as needed for moderate pain. Patient not taking: Reported on 06/15/2018 01/11/17   Ulyses Amor, PA-C     Family History  Problem Relation Age of Onset  . Renal Disease Neg Hx     Social History   Socioeconomic History  . Marital status: Married    Spouse name: Not on file  . Number of children: Not on file  . Years of education: Not on file  . Highest education level: Not on file  Occupational History  . Not on file  Social Needs  . Financial  resource strain: Not on file  . Food insecurity:    Worry: Not on file    Inability: Not on file  . Transportation needs:    Medical: Not on file    Non-medical: Not on file  Tobacco Use  . Smoking status: Former Smoker    Last attempt to quit: 03/21/1986    Years since quitting: 32.2  . Smokeless tobacco: Never Used  Substance and Sexual Activity  . Alcohol use: No    Alcohol/week: 0.0 standard drinks  . Drug use: No  . Sexual activity: Not on file  Lifestyle  . Physical activity:    Days per week: Not on file    Minutes per session: Not on file  . Stress: Not on file  Relationships  . Social connections:    Talks on phone: Not on file    Gets together: Not on file    Attends religious service: Not on file    Active member of club or organization: Not on file    Attends meetings of clubs or organizations: Not on file    Relationship status: Not on file  Other Topics Concern  . Not on file  Social History Narrative  . Not on file     Review of Systems: A 12 point ROS discussed and pertinent positives are indicated in the HPI above.  All other systems are negative.  Review of Systems  Constitutional: Negative for chills and fever.  Respiratory: Negative for shortness of breath and wheezing.   Cardiovascular: Negative for chest pain and palpitations.  Gastrointestinal: Negative for abdominal pain.  Neurological: Negative for dizziness and headaches.  Psychiatric/Behavioral: Negative for behavioral problems and confusion.    Vital Signs: BP (!) 106/92 (BP Location: Right Arm)   Pulse 72   Temp 98.2 F (36.8 C) (Oral)   Resp 17   Ht 6' (1.829 m)   Wt 173 lb 8 oz (78.7 kg)   SpO2 100%   BMI 23.53 kg/m   Physical Exam  Constitutional: He is oriented to person, place, and time. He appears well-developed and well-nourished. No distress.  Cardiovascular: Normal rate, regular rhythm and normal heart sounds.  No murmur heard. Right thigh at site of AVF without  erythema or tenderness; no palpable thrill, no bruit heard on ascultation.  Pulmonary/Chest: Effort normal and breath sounds normal. No respiratory distress. He has no wheezes.  Neurological: He is alert and oriented to person, place, and time.  Skin: Skin is warm and dry.  Psychiatric: He has a normal mood and affect. His behavior is normal. Judgment and thought content normal.  Nursing note and vitals reviewed.    MD Evaluation Airway: WNL Heart: WNL Abdomen: WNL Chest/ Lungs: WNL ASA  Classification: 3 Mallampati/Airway Score: One   Imaging: Ir Thrombectomy Av Fistula/w Thrombolysis/pta Inc Shunt/img Right  Result Date: 06/16/2018 INDICATION: Clotted graft. End-stage renal disease, currently receiving dialysis via right thigh dialysis graft. Patient's dialysis graft has been used for the past 1 year. Patient denies previous intervention or thrombosis of the right thigh dialysis graft. EXAM: 1. FISTULALYSIS 2. ANGIOPLASTY OF VENOUS LIMB AND VENOUS ANASTOMOSIS 3. ULTRASOUND GUIDANCE FOR VENOUS ACCESS COMPARISON:  None. MEDICATIONS: Heparin 4000 units IV; TPA 4 mg into graft. CONTRAST:  40 cc Isovue-300 ANESTHESIA/SEDATION: Moderate (conscious) sedation was employed during this procedure. A total of Versed 4 mg and Fentanyl 100 mcg was administered intravenously. Moderate Sedation Time: 28 minutes. The patient's level of consciousness and vital signs were monitored continuously by radiology nursing throughout the procedure under my direct supervision. FLUOROSCOPY TIME:  4 minutes 36 seconds (48 mGy) COMPLICATIONS: None immediate. TECHNIQUE: Informed written consent was obtained from the patient after a discussion of the risks, benefits and alternatives to treatment. Questions regarding the procedure were encouraged and answered. A timeout was performed prior to the initiation of the procedure. On physical examination, the existing right thigh dialysis graft was negative for palpable pulse or  thrill. The skin overlying the graft was prepped and draped in the usual sterile fashion, and a sterile drape was applied covering the operative field. Maximum barrier sterile technique with sterile gowns and gloves were used for the procedure. Under ultrasound guidance, the dialysis graft was accessed directed towards the venous anastomosis with a micropuncture kit after the overlying soft tissues were anesthetized with 1% lidocaine. An ultrasound image was saved for documentation purposes. The micropuncture sheath was exchange for a 7-French vascular sheath over a guidewire. Over a Benson wire, a Kumpe catheter was advanced centrally and a central venogram was performed. Pull back venogram was performed with the Kumpe catheter. Heparin was administered systemically and TPA was administered via the Kumpe catheter throughout near the entirety  of the venous limb. The venous anastomosis and majority of the venous limb was angioplastied with a 6 mm x 4 cm Conquest balloon. An additional access was obtained directed towards the arterial anastomoses with a micropuncture kit after the overlying soft tissues anesthetized with 1% lidocaine. This allowed for placement of a 6-French vascular sheath. The graft was thrombectomized with several rounds of push-pull mechanical thrombectomy with an occlusion balloon. Flow was restored to the graft as evidenced by restored pulsatility of the graft and brisk blood return from the side arm of the vascular sheath. Shuntograms were performed. At this point, the procedure was terminated. All wires, catheters and sheaths were removed from the patient. Hemostasis was achieved at both access sites with deployment of a swizzle sutures which will be removed at the patient's next dialysis session. Dressings were placed. The patient tolerated the procedure without immediate postprocedural complication. FINDINGS: The existing right thigh AV graft is thrombosed. The venous anastomosis and near  the entirety of the venous limb was angioplastied to 6 mm diameter. The graft was successfully thrombectomized using mechanical and pharmacologic means as above. Completion shuntogram demonstrates the venous limb and anastomosis are widely patent. 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 The central venous system, arterial limb and arterial anastomosis are widely patent. IMPRESSION: 1. Technically successful right thigh AV graft thrombolysis. 2. Successful angioplasty of the venous anastomosis and majority of the venous limb to 6 mm diameter. Completion shuntogram demonstrates the venous limb and anastomosis are widely patent. 3. The arterial anastomosis and arterial limb are widely patent. 4. The central venous system is widely patent. ACCESS: This graft would be amenable to future percutaneous intervention as clinically indicated. Electronically Signed   By: [name] M.D.   On: 06/16/2018 15:35    Labs:  CBC: Recent Labs    08/09/17 2341 06/14/18 1944 06/14/18 1957 06/16/18 1954  WBC 5.0 5.8  --  5.2  HGB 10.3* 13.1 14.3 12.0*  HCT 32.6* 41.2 42.0 36.9*  PLT 348 237  --  249    COAGS: No results for input(s): INR, APTT in the last 8760 hours.  BMP: Recent Labs    08/09/17 2341 06/14/18 1957 06/14/18 2116 06/15/18 0652 06/16/18 0926  NA 137 137 139 140 138  K 3.5 5.2* 5.8* 4.7 5.8*  CL 96* 103 96* 98 95*  CO2 26  --  18* 18* 18*  GLUCOSE 97 78 83 83 73  BUN 51* 82* 84* 91* 105*  CALCIUM 8.7*  --  8.0* 8.0* 8.2*  CREATININE 17.09* >18.00* 22.29* 23.51* 25.42*  GFRNONAA 3*  --  2* 2* NOT CALCULATED  GFRAA 3*  --  2* 2* NOT CALCULATED    LIVER FUNCTION TESTS: Recent Labs    08/09/17 2341 06/15/18 0652  BILITOT 0.7  --   AST 14*  --   ALT 11*  --   ALKPHOS 71  --   PROT 6.6  --   ALBUMIN 3.2* 3.6    TUMOR MARKERS: No results for input(s): AFPTM, CEA, CA199, CHROMGRNA in the last 8760  hours.  Assessment and Plan:  ESRD on HD. Dysfunction of surgical created AVF. Plan for image-guided right thigh thrombolysis with possible angioplasty/stent placement and possible tunneled hemodialysis catheter placement today with Dr. Pascal Lux. Patient is NPO. Denies fever and WBCs WNL. He does not take blood thinners.  Risks and benefits discussed with the patient including, but  not limited to bleeding, infection, vascular injury, pulmonary embolism, need for tunneled HD catheter placement or even death. All of the patient's questions were answered, patient is agreeable to proceed. Consent signed and in chart.   Thank you for this interesting consult.  I greatly enjoyed meeting [name] and look forward to participating in their care.  A copy of this report was sent to the requesting provider on this date.  Electronically Signed: Earley Abide, PA-C 06/17/2018, 10:54 AM   I spent a total of 20 Minutes in face to face in clinical consultation, greater than 50% of which was counseling/coordinating care for ESRD on HD.

## 2018-06-18 ENCOUNTER — Encounter (HOSPITAL_COMMUNITY): Payer: Self-pay

## 2018-06-18 LAB — RENAL FUNCTION PANEL
ALBUMIN: 3.3 g/dL — AB (ref 3.5–5.0)
ANION GAP: 19 — AB (ref 5–15)
BUN: 66 mg/dL — ABNORMAL HIGH (ref 6–20)
CALCIUM: 8.7 mg/dL — AB (ref 8.9–10.3)
CO2: 24 mmol/L (ref 22–32)
Chloride: 93 mmol/L — ABNORMAL LOW (ref 98–111)
Creatinine, Ser: 20.29 mg/dL — ABNORMAL HIGH (ref 0.61–1.24)
GFR calc non Af Amer: 2 mL/min — ABNORMAL LOW (ref >60)
GFR, EST AFRICAN AMERICAN: 3 mL/min — AB (ref >60)
Glucose, Bld: 139 mg/dL — ABNORMAL HIGH (ref 70–99)
PHOSPHORUS: 5.9 mg/dL — AB (ref 2.5–4.6)
Potassium: 4.7 mmol/L (ref 3.5–5.1)
SODIUM: 136 mmol/L (ref 135–145)

## 2018-06-18 LAB — CBC
HCT: 40.7 % (ref 39.0–52.0)
HCT: 37.1 % — ABNORMAL LOW (ref 39.0–52.0)
HEMOGLOBIN: 11.9 g/dL — AB (ref 13.0–17.0)
Hemoglobin: 13.1 g/dL (ref 13.0–17.0)
MCH: 30.8 pg (ref 26.0–34.0)
MCH: 30.7 pg (ref 26.0–34.0)
MCHC: 32.2 g/dL (ref 30.0–36.0)
MCHC: 32.1 g/dL (ref 30.0–36.0)
MCV: 95.5 fL (ref 78.0–100.0)
MCV: 95.6 fL (ref 78.0–100.0)
PLATELETS: 221 10*3/uL (ref 150–400)
PLATELETS: 193 10*3/uL (ref 150–400)
RBC: 4.26 MIL/uL (ref 4.22–5.81)
RBC: 3.88 MIL/uL — AB (ref 4.22–5.81)
RDW: 13.7 % (ref 11.5–15.5)
RDW: 13.6 % (ref 11.5–15.5)
WBC: 3.8 10*3/uL — ABNORMAL LOW (ref 4.0–10.5)
WBC: 3.8 10*3/uL — ABNORMAL LOW (ref 4.0–10.5)

## 2018-06-18 LAB — TYPE AND SCREEN
ABO/RH(D): O POS
Antibody Screen: NEGATIVE

## 2018-06-18 MED ORDER — SODIUM CHLORIDE 0.9 % IV SOLN: 100.0000 mL | INTRAVENOUS | Status: DC | PRN

## 2018-06-18 MED ORDER — HEPARIN SODIUM (PORCINE) 1000 UNIT/ML DIALYSIS: 1500.0000 [IU] | INTRAMUSCULAR | Status: DC | PRN

## 2018-06-18 MED ORDER — PENTAFLUOROPROP-TETRAFLUOROETH EX AERO: 1.0000 "application " | INHALATION_SPRAY | CUTANEOUS | Status: DC | PRN

## 2018-06-18 NOTE — Progress Notes (Signed)
     [name] was admitted to the Hospital on 06/14/2018 and Discharged  06/18/2018 and should be excused from work/school   for 5  days starting from date -  06/14/2018 , may return to work/school without any restrictions.  Call Lala Lund MD, [facility]  [phone] with questions.  Lala Lund M.D on 06/18/2018,at 11:44 AM  [facility]   Office  [phone]

## 2018-06-18 NOTE — Progress Notes (Signed)
Hemodialysis treatment note. 1423 - HD trmt initiated w/o difficulty per orders.  Pre weight 79.3 kg obtained via standing scale. 1810 - Patient stated he did not feel well, BP 94/52.  Treatment ended and 250 cc NS given rinse back. 1815 - BP 110/65.  Positive bruit and thrill present in AVG 1830 - Completed removal of HD needles (art first and venous 2nd)  Positive bruit and thrill present. 1835 - BP recheck 88/50 - Pt given bolus 200 cc NS.   1850 - BP 91/46.  Positive bruit and thrill present per patient  1910 - BP 113/68.  Absent bruit and thrill via palpation and auscultation. 1915 - Post wt 76.7 kg standing scale 1920 - Dr. Florene Glen notified of above events.  New order received for NPO after midnight.

## 2018-06-18 NOTE — Progress Notes (Signed)
Notified Anthony Najjar, NP that pt's BP is 81/48 HR 103. Pt is asymptomatic. Pt came back from dialysis tonight. Will continue to monitor pt. Ranelle Oyster, RN

## 2018-06-18 NOTE — Progress Notes (Addendum)
[facility] Progress Note   Subjective:  Seen in room. No CP/dyspnea. S/p repeat declot yesterday evening and then had some oozing from his thigh AVG overnight. This morning, bleeding has stopped and it remains patent.  Objective Vitals:   06/17/18 1548 06/17/18 2118 06/17/18 2348 06/18/18 0450  BP: 109/75 114/78 110/74 (!) 131/93  Pulse: 75 69  66  Resp: '12 18  16  ' Temp:  98.2 F (36.8 C)  98.1 F (36.7 C)  TempSrc:  Oral  Oral  SpO2: 100% 98%  100%  Weight:      Height:       Physical Exam General: Well appearing, NAD Heart: RRR; no murmur Lungs: CTA in upper lobes, dry rales in L base only Extremities: No LE edema Dialysis Access:  R thigh AVG + bruit, bandage with dried blood present.  Dialysis Orders: MWF -NW 3.75hrs, 450/800, EDW 77.5kg,2K/2.5Ca, P4/linear Na, AVG, Heparin1500 units  Assessment/Plan: 1. Thrombosed AV graft: S/p successful declot 9/14, then re-stenosed requiring repeat declot on 9/15. Will plan to dialyze today to assure useable prior to discharge. 2. ESRD: Continue HD per MWF schedule. As above, HD today then discharge. 3. Hypertension/volum: BP ok today, hypotensive during last HD. Follow. 4. Anemia of CKD: Hgb 12.0. No indication for ESAat this time.  5. Hx parathyroidectomy: Ca in goal. Phos^, continue Ca carbonate binders.  6. Nutrition: Alb 3.6, renal diet with fluid restrictions.  7. Hep B +  Veneta Penton, PA-C 06/18/2018, 10:50 AM  [facility] Pager: [phone]  Pt seen, examined and agree w A/P as above. Can go home today after HD assuming graft works.  Kelly Splinter MD [facility] pager [phone]   06/18/2018, 12:33 PM    Additional Objective Labs: Basic Metabolic Panel: Recent Labs  Lab 06/15/18 0652 06/16/18 0926 06/17/18 1147  NA 140 138 136  K 4.7 5.8* 4.7  CL 98 95* 94*  CO2 18* 18* 21*  GLUCOSE 83 73 113*  BUN 91* 105* 54*  CREATININE 23.51* 25.42*  17.37*  CALCIUM 8.0* 8.2* 8.5*  PHOS 6.9*  --  6.7*   Liver Function Tests: Recent Labs  Lab 06/15/18 0652 06/17/18 1147  ALBUMIN 3.6 3.6   CBC: Recent Labs  Lab 06/14/18 1944 06/14/18 1957 06/16/18 1954  WBC 5.8  --  5.2  NEUTROABS 3.7  --   --   HGB 13.1 14.3 12.0*  HCT 41.2 42.0 36.9*  MCV 97.2  --  94.9  PLT 237  --  249   Studies/Results: Ir US Guide Vasc Access Right  Result Date: 06/18/2018 Study Result INDICATION: Clotted graft.  End-stage renal disease, currently receiving dialysis via right thigh dialysis graft.  Patient underwent technically successful 5 dialysis graft yesterday, 06/16/2018, and while receiving a complete dialysis session after graft lysis, the patient states he experienced a transient episode of hypotension which has resulted in early graft rethrombosis.  As such, patient presents today for repeat attempted graft thrombolysis.  EXAM: 1. FISTULALYSIS 2. ANGIOPLASTY OF VENOUS LIMB AND VENOUS ANASTOMOSIS 3. ULTRASOUND GUIDANCE FOR VENOUS ACCESS  COMPARISON:  Declot-06/16/2018  MEDICATIONS: Heparin 4000 units IV; TPA 4 mg into graft.  CONTRAST:  50 cc Isovue-300  ANESTHESIA/SEDATION: Moderate (conscious) sedation was employed during this procedure. A total of Versed 5 mg and Fentanyl 200 mcg was administered intravenously.  Moderate Sedation Time: 42 minutes. The patient's level of consciousness and vital signs were monitored continuously by radiology nursing throughout the procedure under my direct  supervision.  FLUOROSCOPY TIME:  4 minutes, 12 seconds (44 mGy)  COMPLICATIONS: None immediate.  TECHNIQUE: Informed written consent was obtained from the patient after a discussion of the risks, benefits and alternatives to treatment. Questions regarding the procedure were encouraged and answered. A timeout was performed prior to the initiation of the procedure.  On physical examination, the existing right thigh dialysis graft was negative for palpable  pulse or thrill. The skin overlying the graft was prepped and draped in the usual sterile fashion, and a sterile drape was applied covering the operative field. Maximum barrier sterile technique with sterile gowns and gloves were used for the procedure.  Under ultrasound guidance, the dialysis graft was accessed directed towards the venous anastomosis with a micropuncture kit after the overlying soft tissues were anesthetized with 1% lidocaine. An ultrasound image was saved for documentation purposes. The micropuncture sheath was exchange for a 7-French vascular sheath over a guidewire. Over a Benson wire, a Kumpe catheter was advanced centrally and a central venogram was performed. Pull back venogram was performed with the Kumpe catheter. Heparin was administered systemically and TPA was administered via the Kumpe catheter throughout near the entirety of the venous limb.  The venous anastomosis and majority of the venous limb was angioplastied with a 6 mm x 4 cm Conquest balloon. An additional access was obtained directed towards the arterial anastomoses with a micropuncture kit after the overlying soft tissues anesthetized with 1% lidocaine. This allowed for placement of a 6-French vascular sheath. The graft was thrombectomized with several rounds of push-pull mechanical thrombectomy with an occlusion balloon. Flow was restored to the graft as evidenced by restored pulsatility of the graft and brisk blood return from the side arm of the vascular sheath. Shuntograms were performed.  Repeat angioplasty was performed with the 6 mm x 4 cm Conquest balloon at 2 sites of extravasation within the stick zone of the graft, one of which appear to communicate with a draining superficial vein.  Completion images were obtained and the procedure was terminated.  At this point, the procedure was terminated. All wires, catheters and sheaths were removed from the patient. Hemostasis was achieved at both access sites with  deployment of a swizzle sutures which will be removed at the patient's next dialysis session. Dressings were placed. The patient tolerated the procedure without immediate postprocedural complication.  FINDINGS: The existing right thigh AV graft is thrombosed. The venous anastomosis and near the entirety of the venous limb was angioplastied to 6 mm diameter. The graft was successfully thrombectomized using mechanical and pharmacologic means as above.  Prolonged angioplasty was performed at 2 small areas of extravasation within the stick zone of the graft, 1 of which appear to communicate with a draining superficial vein.  Completion shuntogram demonstrates the venous limb and anastomosis are widely patent with tiny areas of extravasation at previous dialysis graft access sites which was controlled with additional manual compression. 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  The central venous system, arterial limb and arterial anastomosis are widely patent.  IMPRESSION: 1. Technically successful right thigh AV graft thrombolysis. 2. Successful angioplasty of the venous anastomosis and majority of the venous limb to 6 mm diameter. Completion shuntogram demonstrates the venous limb and anastomosis are widely patent. 3. The arterial anastomosis and arterial limb are widely patent. 4. The central venous system is widely patent.  ACCESS: Given early graft  thrombosis (patient underwent technically successful graft declot procedure day prior on 06/16/2018), would recommend surgical revision if the graft were to rethrombosis within the next 4-6 weeks.   Electronically Signed   By: [name] M.D.   On: 06/17/2018 18:40    Ir US Guide Vasc Access Right  Result Date: 06/17/2018 INDICATION: Clotted graft. End-stage renal disease, currently receiving dialysis via right thigh dialysis graft. Patient underwent technically  successful 5 dialysis graft yesterday, 06/16/2018, and while receiving a complete dialysis session after graft lysis, the patient states he experienced a transient episode of hypotension which has resulted in early graft rethrombosis. As such, patient presents today for repeat attempted graft thrombolysis. EXAM: 1. FISTULALYSIS 2. ANGIOPLASTY OF VENOUS LIMB AND VENOUS ANASTOMOSIS 3. ULTRASOUND GUIDANCE FOR VENOUS ACCESS COMPARISON:  Declot-06/16/2018 MEDICATIONS: Heparin 4000 units IV; TPA 4 mg into graft. CONTRAST:  50 cc Isovue-300 ANESTHESIA/SEDATION: Moderate (conscious) sedation was employed during this procedure. A total of Versed 5 mg and Fentanyl 200 mcg was administered intravenously. Moderate Sedation Time: 42 minutes. The patient's level of consciousness and vital signs were monitored continuously by radiology nursing throughout the procedure under my direct supervision. FLUOROSCOPY TIME:  4 minutes, 12 seconds (44 mGy) COMPLICATIONS: None immediate. TECHNIQUE: Informed written consent was obtained from the patient after a discussion of the risks, benefits and alternatives to treatment. Questions regarding the procedure were encouraged and answered. A timeout was performed prior to the initiation of the procedure. On physical examination, the existing right thigh dialysis graft was negative for palpable pulse or thrill. The skin overlying the graft was prepped and draped in the usual sterile fashion, and a sterile drape was applied covering the operative field. Maximum barrier sterile technique with sterile gowns and gloves were used for the procedure. Under ultrasound guidance, the dialysis graft was accessed directed towards the venous anastomosis with a micropuncture kit after the overlying soft tissues were anesthetized with 1% lidocaine. An ultrasound image was saved for documentation purposes. The micropuncture sheath was exchange for a 7-French vascular sheath over a guidewire. Over a Benson wire,  a Kumpe catheter was advanced centrally and a central venogram was performed. Pull back venogram was performed with the Kumpe catheter. Heparin was administered systemically and TPA was administered via the Kumpe catheter throughout near the entirety of the venous limb. The venous anastomosis and majority of the venous limb was angioplastied with a 6 mm x 4 cm Conquest balloon. An additional access was obtained directed towards the arterial anastomoses with a micropuncture kit after the overlying soft tissues anesthetized with 1% lidocaine. This allowed for placement of a 6-French vascular sheath. The graft was thrombectomized with several rounds of push-pull mechanical thrombectomy with an occlusion balloon. Flow was restored to the graft as evidenced by restored pulsatility of the graft and brisk blood return from the side arm of the vascular sheath. Shuntograms were performed. Repeat angioplasty was performed with the 6 mm x 4 cm Conquest balloon at 2 sites of extravasation within the stick zone of the graft, one of which appear to communicate with a draining superficial vein. Completion images were obtained and the procedure was terminated. At this point, the procedure was terminated. All wires, catheters and sheaths were removed from the patient. Hemostasis was achieved at both access sites with deployment of a swizzle sutures which will be removed at the patient's next dialysis session. Dressings were placed. The patient tolerated the procedure without immediate postprocedural complication. FINDINGS: The existing right thigh AV graft is  thrombosed. The venous anastomosis and near the entirety of the venous limb was angioplastied to 6 mm diameter. The graft was successfully thrombectomized using mechanical and pharmacologic means as above. Prolonged angioplasty was performed at 2 small areas of extravasation within the stick zone of the graft, 1 of which appear to communicate with a draining superficial vein.  Completion shuntogram demonstrates the venous limb and anastomosis are widely patent with tiny areas of extravasation at previous dialysis graft access sites which was controlled with additional manual compression. 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 The central venous system, arterial limb and arterial anastomosis are widely patent. IMPRESSION: 1. Technically successful right thigh AV graft thrombolysis. 2. Successful angioplasty of the venous anastomosis and majority of the venous limb to 6 mm diameter. Completion shuntogram demonstrates the venous limb and anastomosis are widely patent. 3. The arterial anastomosis and arterial limb are widely patent. 4. The central venous system is widely patent. ACCESS: Given early graft thrombosis (patient underwent technically successful graft declot procedure day prior on 06/16/2018), would recommend surgical revision if the graft were to rethrombosis within the next 4-6 weeks. Electronically Signed   By: [name] M.D.   On: 06/17/2018 18:40   Ir Thrombectomy Av Fistula/w Thrombolysis/pta Inc Shunt/img Right  Result Date: 06/17/2018 INDICATION: Clotted graft. End-stage renal disease, currently receiving dialysis via right thigh dialysis graft. Patient underwent technically successful 5 dialysis graft yesterday, 06/16/2018, and while receiving a complete dialysis session after graft lysis, the patient states he experienced a transient episode of hypotension which has resulted in early graft rethrombosis. As such, patient presents today for repeat attempted graft thrombolysis. EXAM: 1. FISTULALYSIS 2. ANGIOPLASTY OF VENOUS LIMB AND VENOUS ANASTOMOSIS 3. ULTRASOUND GUIDANCE FOR VENOUS ACCESS COMPARISON:  Declot-06/16/2018 MEDICATIONS: Heparin 4000 units IV; TPA 4 mg into graft. CONTRAST:  50 cc Isovue-300 ANESTHESIA/SEDATION: Moderate (conscious) sedation was employed  during this procedure. A total of Versed 5 mg and Fentanyl 200 mcg was administered intravenously. Moderate Sedation Time: 42 minutes. The patient's level of consciousness and vital signs were monitored continuously by radiology nursing throughout the procedure under my direct supervision. FLUOROSCOPY TIME:  4 minutes, 12 seconds (44 mGy) COMPLICATIONS: None immediate. TECHNIQUE: Informed written consent was obtained from the patient after a discussion of the risks, benefits and alternatives to treatment. Questions regarding the procedure were encouraged and answered. A timeout was performed prior to the initiation of the procedure. On physical examination, the existing right thigh dialysis graft was negative for palpable pulse or thrill. The skin overlying the graft was prepped and draped in the usual sterile fashion, and a sterile drape was applied covering the operative field. Maximum barrier sterile technique with sterile gowns and gloves were used for the procedure. Under ultrasound guidance, the dialysis graft was accessed directed towards the venous anastomosis with a micropuncture kit after the overlying soft tissues were anesthetized with 1% lidocaine. An ultrasound image was saved for documentation purposes. The micropuncture sheath was exchange for a 7-French vascular sheath over a guidewire. Over a Benson wire, a Kumpe catheter was advanced centrally and a central venogram was performed. Pull back venogram was performed with the Kumpe catheter. Heparin was administered systemically and TPA was administered via the Kumpe catheter throughout near the entirety of the venous limb. The venous anastomosis and majority of the venous limb was angioplastied with a 6 mm x 4 cm Conquest  balloon. An additional access was obtained directed towards the arterial anastomoses with a micropuncture kit after the overlying soft tissues anesthetized with 1% lidocaine. This allowed for placement of a 6-French vascular sheath.  The graft was thrombectomized with several rounds of push-pull mechanical thrombectomy with an occlusion balloon. Flow was restored to the graft as evidenced by restored pulsatility of the graft and brisk blood return from the side arm of the vascular sheath. Shuntograms were performed. Repeat angioplasty was performed with the 6 mm x 4 cm Conquest balloon at 2 sites of extravasation within the stick zone of the graft, one of which appear to communicate with a draining superficial vein. Completion images were obtained and the procedure was terminated. At this point, the procedure was terminated. All wires, catheters and sheaths were removed from the patient. Hemostasis was achieved at both access sites with deployment of a swizzle sutures which will be removed at the patient's next dialysis session. Dressings were placed. The patient tolerated the procedure without immediate postprocedural complication. FINDINGS: The existing right thigh AV graft is thrombosed. The venous anastomosis and near the entirety of the venous limb was angioplastied to 6 mm diameter. The graft was successfully thrombectomized using mechanical and pharmacologic means as above. Prolonged angioplasty was performed at 2 small areas of extravasation within the stick zone of the graft, 1 of which appear to communicate with a draining superficial vein. Completion shuntogram demonstrates the venous limb and anastomosis are widely patent with tiny areas of extravasation at previous dialysis graft access sites which was controlled with additional manual compression. 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 The central venous system, arterial limb and arterial anastomosis are widely patent. IMPRESSION: 1. Technically successful right thigh AV graft thrombolysis. 2. Successful angioplasty of the venous anastomosis and majority of the venous limb to 6 mm  diameter. Completion shuntogram demonstrates the venous limb and anastomosis are widely patent. 3. The arterial anastomosis and arterial limb are widely patent. 4. The central venous system is widely patent. ACCESS: Given early graft thrombosis (patient underwent technically successful graft declot procedure day prior on 06/16/2018), would recommend surgical revision if the graft were to rethrombosis within the next 4-6 weeks. Electronically Signed   By: [name] M.D.   On: 06/17/2018 18:40   Ir Thrombectomy Av Fistula/w Thrombolysis/pta Inc Shunt/img Right  Result Date: 06/16/2018 INDICATION: Clotted graft. End-stage renal disease, currently receiving dialysis via right thigh dialysis graft. Patient's dialysis graft has been used for the past 1 year. Patient denies previous intervention or thrombosis of the right thigh dialysis graft. EXAM: 1. FISTULALYSIS 2. ANGIOPLASTY OF VENOUS LIMB AND VENOUS ANASTOMOSIS 3. ULTRASOUND GUIDANCE FOR VENOUS ACCESS COMPARISON:  None. MEDICATIONS: Heparin 4000 units IV; TPA 4 mg into graft. CONTRAST:  40 cc Isovue-300 ANESTHESIA/SEDATION: Moderate (conscious) sedation was employed during this procedure. A total of Versed 4 mg and Fentanyl 100 mcg was administered intravenously. Moderate Sedation Time: 28 minutes. The patient's level of consciousness and vital signs were monitored continuously by radiology nursing throughout the procedure under my direct supervision. FLUOROSCOPY TIME:  4 minutes 36 seconds (48 mGy) COMPLICATIONS: None immediate. TECHNIQUE: Informed written consent was obtained from the patient after a discussion of the risks, benefits and alternatives to treatment. Questions regarding the procedure were encouraged and answered. A timeout was performed prior to the initiation of the procedure. On physical examination, the existing right thigh  dialysis graft was negative for palpable pulse or thrill. The skin overlying the graft was prepped and draped in the  usual sterile fashion, and a sterile drape was applied covering the operative field. Maximum barrier sterile technique with sterile gowns and gloves were used for the procedure. Under ultrasound guidance, the dialysis graft was accessed directed towards the venous anastomosis with a micropuncture kit after the overlying soft tissues were anesthetized with 1% lidocaine. An ultrasound image was saved for documentation purposes. The micropuncture sheath was exchange for a 7-French vascular sheath over a guidewire. Over a Benson wire, a Kumpe catheter was advanced centrally and a central venogram was performed. Pull back venogram was performed with the Kumpe catheter. Heparin was administered systemically and TPA was administered via the Kumpe catheter throughout near the entirety of the venous limb. The venous anastomosis and majority of the venous limb was angioplastied with a 6 mm x 4 cm Conquest balloon. An additional access was obtained directed towards the arterial anastomoses with a micropuncture kit after the overlying soft tissues anesthetized with 1% lidocaine. This allowed for placement of a 6-French vascular sheath. The graft was thrombectomized with several rounds of push-pull mechanical thrombectomy with an occlusion balloon. Flow was restored to the graft as evidenced by restored pulsatility of the graft and brisk blood return from the side arm of the vascular sheath. Shuntograms were performed. At this point, the procedure was terminated. All wires, catheters and sheaths were removed from the patient. Hemostasis was achieved at both access sites with deployment of a swizzle sutures which will be removed at the patient's next dialysis session. Dressings were placed. The patient tolerated the procedure without immediate postprocedural complication. FINDINGS: The existing right thigh AV graft is thrombosed. The venous anastomosis and near the entirety of the venous limb was angioplastied to 6 mm diameter.  The graft was successfully thrombectomized using mechanical and pharmacologic means as above. Completion shuntogram demonstrates the venous limb and anastomosis are widely patent. 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 The central venous system, arterial limb and arterial anastomosis are widely patent. IMPRESSION: 1. Technically successful right thigh AV graft thrombolysis. 2. Successful angioplasty of the venous anastomosis and majority of the venous limb to 6 mm diameter. Completion shuntogram demonstrates the venous limb and anastomosis are widely patent. 3. The arterial anastomosis and arterial limb are widely patent. 4. The central venous system is widely patent. ACCESS: This graft would be amenable to future percutaneous intervention as clinically indicated. Electronically Signed   By: [name] M.D.   On: 06/16/2018 15:35   Medications: . sodium chloride     . calcium carbonate  8 tablet Oral TID WC  . Chlorhexidine Gluconate Cloth  6 each Topical Q0600  . heparin  5,000 Units Subcutaneous Q8H  . sodium chloride flush  3 mL Intravenous Q12H  . sodium chloride flush  3 mL Intravenous Q12H

## 2018-06-18 NOTE — Progress Notes (Signed)
Pt is back from HD. Pt is not going to be d/c tonight. Pt is going to be NPO after midnight per Dr. Florene Glen. Pt's graft clotted off in HD. Will continue to monitor pt. Ranelle Oyster, RN

## 2018-06-19 ENCOUNTER — Inpatient Hospital Stay (HOSPITAL_COMMUNITY): Payer: Medicaid Other | Admitting: Anesthesiology

## 2018-06-19 ENCOUNTER — Encounter (HOSPITAL_COMMUNITY): Payer: Self-pay | Admitting: Anesthesiology

## 2018-06-19 ENCOUNTER — Encounter (HOSPITAL_COMMUNITY)
Admission: EM | Disposition: A | Discharge status: Home / Self Care | Payer: Self-pay | Source: Home / Self Care | Attending: Internal Medicine

## 2018-06-19 DIAGNOSIS — T82868A Thrombosis of vascular prosthetic devices, implants and grafts, initial encounter: Secondary | ICD-10-CM

## 2018-06-19 HISTORY — PX: THROMBECTOMY W/ EMBOLECTOMY: SHX2507

## 2018-06-19 LAB — BASIC METABOLIC PANEL
Anion gap: 18 — ABNORMAL HIGH (ref 5–15)
BUN: 35 mg/dL — ABNORMAL HIGH (ref 6–20)
CALCIUM: 8.3 mg/dL — AB (ref 8.9–10.3)
CO2: 23 mmol/L (ref 22–32)
CREATININE: 13.8 mg/dL — AB (ref 0.61–1.24)
Chloride: 94 mmol/L — ABNORMAL LOW (ref 98–111)
GFR calc non Af Amer: 4 mL/min — ABNORMAL LOW (ref >60)
GFR, EST AFRICAN AMERICAN: 4 mL/min — AB (ref >60)
Glucose, Bld: 91 mg/dL (ref 70–99)
Potassium: 5.2 mmol/L — ABNORMAL HIGH (ref 3.5–5.1)
SODIUM: 135 mmol/L (ref 135–145)

## 2018-06-19 LAB — CBC
HCT: 39.4 % (ref 39.0–52.0)
Hemoglobin: 12.8 g/dL — ABNORMAL LOW (ref 13.0–17.0)
MCH: 31.1 pg (ref 26.0–34.0)
MCHC: 32.5 g/dL (ref 30.0–36.0)
MCV: 95.6 fL (ref 78.0–100.0)
Platelets: 203 10*3/uL (ref 150–400)
RBC: 4.12 MIL/uL — ABNORMAL LOW (ref 4.22–5.81)
RDW: 13.6 % (ref 11.5–15.5)
WBC: 5.5 10*3/uL (ref 4.0–10.5)

## 2018-06-19 SURGERY — THROMBECTOMY ARTERIOVENOUS GORE-TEX GRAFT
Anesthesia: General | Site: Thigh | Laterality: Right

## 2018-06-19 MED ORDER — MIDAZOLAM HCL 5 MG/5ML IJ SOLN
INTRAMUSCULAR | Status: DC | PRN
  Administered 2018-06-19: 2 mg via INTRAVENOUS

## 2018-06-19 MED ORDER — DEXAMETHASONE SODIUM PHOSPHATE 10 MG/ML IJ SOLN
INTRAMUSCULAR | Status: DC | PRN
  Administered 2018-06-19: 10 mg via INTRAVENOUS

## 2018-06-19 MED ORDER — PROTAMINE SULFATE 10 MG/ML IV SOLN
INTRAVENOUS | Status: DC | PRN
  Administered 2018-06-19 (×4): 10 mg via INTRAVENOUS

## 2018-06-19 MED ORDER — HEPARIN SODIUM (PORCINE) 1000 UNIT/ML IJ SOLN
INTRAMUSCULAR | Status: AC
  Filled 2018-06-19: qty 1

## 2018-06-19 MED ORDER — OXYCODONE-ACETAMINOPHEN 5-325 MG PO TABS
1.0000 | ORAL_TABLET | ORAL | Status: DC | PRN
  Administered 2018-06-20: 1 via ORAL

## 2018-06-19 MED ORDER — LIDOCAINE 2% (20 MG/ML) 5 ML SYRINGE
INTRAMUSCULAR | Status: AC
  Filled 2018-06-19: qty 5

## 2018-06-19 MED ORDER — PROTAMINE SULFATE 10 MG/ML IV SOLN
INTRAVENOUS | Status: AC
  Filled 2018-06-19: qty 5

## 2018-06-19 MED ORDER — PHENYLEPHRINE 40 MCG/ML (10ML) SYRINGE FOR IV PUSH (FOR BLOOD PRESSURE SUPPORT)
PREFILLED_SYRINGE | INTRAVENOUS | Status: DC | PRN
  Administered 2018-06-19: 120 ug via INTRAVENOUS
  Administered 2018-06-19 (×3): 80 ug via INTRAVENOUS
  Administered 2018-06-19: 40 ug via INTRAVENOUS

## 2018-06-19 MED ORDER — LIDOCAINE HCL (CARDIAC) PF 100 MG/5ML IV SOSY
PREFILLED_SYRINGE | INTRAVENOUS | Status: DC | PRN
  Administered 2018-06-19: 80 mg via INTRAVENOUS

## 2018-06-19 MED ORDER — SODIUM CHLORIDE 0.9 % IV SOLN
INTRAVENOUS | Status: DC | PRN
  Administered 2018-06-19: 20 ug/min via INTRAVENOUS

## 2018-06-19 MED ORDER — SODIUM CHLORIDE 0.9 % IV SOLN
INTRAVENOUS | Status: DC | PRN
  Administered 2018-06-19: 14:00:00

## 2018-06-19 MED ORDER — LIDOCAINE-EPINEPHRINE (PF) 1 %-1:200000 IJ SOLN
INTRAMUSCULAR | Status: AC
  Filled 2018-06-19: qty 30

## 2018-06-19 MED ORDER — 0.9 % SODIUM CHLORIDE (POUR BTL) OPTIME
TOPICAL | Status: DC | PRN
  Administered 2018-06-19: 1000 mL

## 2018-06-19 MED ORDER — PROPOFOL 10 MG/ML IV BOLUS
INTRAVENOUS | Status: DC | PRN
  Administered 2018-06-19: 50 mg via INTRAVENOUS
  Administered 2018-06-19 (×2): 30 mg via INTRAVENOUS
  Administered 2018-06-19: 20 mg via INTRAVENOUS
  Administered 2018-06-19: 100 mg via INTRAVENOUS

## 2018-06-19 MED ORDER — DEXAMETHASONE SODIUM PHOSPHATE 10 MG/ML IJ SOLN
INTRAMUSCULAR | Status: AC
  Filled 2018-06-19: qty 1

## 2018-06-19 MED ORDER — HEPARIN SODIUM (PORCINE) 1000 UNIT/ML IJ SOLN
INTRAMUSCULAR | Status: DC | PRN
  Administered 2018-06-19: 8000 [IU] via INTRAVENOUS

## 2018-06-19 MED ORDER — SODIUM CHLORIDE 0.9 % IV SOLN
INTRAVENOUS | Status: AC
  Filled 2018-06-19: qty 1.2

## 2018-06-19 MED ORDER — LIDOCAINE-EPINEPHRINE (PF) 2 %-1:200000 IJ SOLN
INTRAMUSCULAR | Status: DC | PRN
  Administered 2018-06-19: 30 mL via INTRADERMAL

## 2018-06-19 MED ORDER — SODIUM CHLORIDE 0.9 % IV SOLN
1.5000 g | INTRAVENOUS | Status: AC
  Administered 2018-06-19: 1.5 g via INTRAVENOUS
  Filled 2018-06-19: qty 1.5

## 2018-06-19 MED ORDER — THROMBIN (RECOMBINANT) 5000 UNITS EX SOLR
CUTANEOUS | Status: AC
  Filled 2018-06-19: qty 5000

## 2018-06-19 MED ORDER — ONDANSETRON HCL 4 MG/2ML IJ SOLN
INTRAMUSCULAR | Status: AC
  Filled 2018-06-19: qty 2

## 2018-06-19 MED ORDER — MIDAZOLAM HCL 2 MG/2ML IJ SOLN
INTRAMUSCULAR | Status: AC
  Filled 2018-06-19: qty 2

## 2018-06-19 MED ORDER — FENTANYL CITRATE (PF) 100 MCG/2ML IJ SOLN
INTRAMUSCULAR | Status: DC | PRN
  Administered 2018-06-19: 50 ug via INTRAVENOUS
  Administered 2018-06-19: 100 ug via INTRAVENOUS
  Administered 2018-06-19 (×2): 50 ug via INTRAVENOUS

## 2018-06-19 MED ORDER — ONDANSETRON HCL 4 MG/2ML IJ SOLN
INTRAMUSCULAR | Status: DC | PRN
  Administered 2018-06-19: 4 mg via INTRAVENOUS

## 2018-06-19 MED ORDER — SODIUM CHLORIDE 0.9 % IV SOLN
INTRAVENOUS | Status: DC
  Administered 2018-06-19 (×2): via INTRAVENOUS

## 2018-06-19 MED ORDER — ALBUMIN HUMAN 5 % IV SOLN
INTRAVENOUS | Status: DC | PRN
  Administered 2018-06-19 (×3): via INTRAVENOUS

## 2018-06-19 MED ORDER — FENTANYL CITRATE (PF) 100 MCG/2ML IJ SOLN: 25.0000 ug | INTRAMUSCULAR | Status: DC | PRN

## 2018-06-19 MED ORDER — FENTANYL CITRATE (PF) 250 MCG/5ML IJ SOLN
INTRAMUSCULAR | Status: AC
  Filled 2018-06-19: qty 5

## 2018-06-19 MED ORDER — PROPOFOL 10 MG/ML IV BOLUS
INTRAVENOUS | Status: AC
  Filled 2018-06-19: qty 20

## 2018-06-19 MED ORDER — SODIUM CHLORIDE 0.9 % IV SOLN: INTRAVENOUS | Status: DC

## 2018-06-19 SURGICAL SUPPLY — 61 items
ADH SKN CLS APL DERMABOND .7 (GAUZE/BANDAGES/DRESSINGS) ×2
ARMBAND PINK RESTRICT EXTREMIT (MISCELLANEOUS) ×3 IMPLANT
BAG DECANTER FOR FLEXI CONT (MISCELLANEOUS) ×3 IMPLANT
BIOPATCH RED 1 DISK 7.0 (GAUZE/BANDAGES/DRESSINGS) ×3 IMPLANT
CANISTER SUCT 3000ML PPV (MISCELLANEOUS) ×3 IMPLANT
CANNULA VESSEL 3MM 2 BLNT TIP (CANNULA) IMPLANT
CATH EMB 4FR 80CM (CATHETERS) ×5 IMPLANT
CATH PALINDROME RT-P 15FX19CM (CATHETERS) IMPLANT
CATH PALINDROME RT-P 15FX23CM (CATHETERS) IMPLANT
CATH PALINDROME RT-P 15FX28CM (CATHETERS) IMPLANT
CATH PALINDROME RT-P 15FX55CM (CATHETERS) IMPLANT
CHLORAPREP W/TINT 26ML (MISCELLANEOUS) ×3 IMPLANT
CLIP VESOCCLUDE MED 6/CT (CLIP) ×3 IMPLANT
CLIP VESOCCLUDE SM WIDE 6/CT (CLIP) ×3 IMPLANT
COVER PROBE W GEL 5X96 (DRAPES) IMPLANT
COVER SURGICAL LIGHT HANDLE (MISCELLANEOUS) ×3 IMPLANT
DERMABOND ADVANCED (GAUZE/BANDAGES/DRESSINGS) ×1
DERMABOND ADVANCED .7 DNX12 (GAUZE/BANDAGES/DRESSINGS) ×2 IMPLANT
DRAPE C-ARM 42X72 X-RAY (DRAPES) ×3 IMPLANT
DRAPE CHEST BREAST 15X10 FENES (DRAPES) ×3 IMPLANT
DRAPE X-RAY CASS 24X20 (DRAPES) IMPLANT
ELECT REM PT RETURN 9FT ADLT (ELECTROSURGICAL) ×3
ELECTRODE REM PT RTRN 9FT ADLT (ELECTROSURGICAL) ×2 IMPLANT
GAUZE 4X4 16PLY RFD (DISPOSABLE) ×3 IMPLANT
GAUZE SPONGE 4X4 16PLY XRAY LF (GAUZE/BANDAGES/DRESSINGS) ×2 IMPLANT
GLOVE BIO SURGEON STRL SZ7.5 (GLOVE) ×3 IMPLANT
GLOVE BIOGEL PI IND STRL 8 (GLOVE) ×2 IMPLANT
GLOVE BIOGEL PI INDICATOR 8 (GLOVE) ×1
GOWN STRL REUS W/ TWL LRG LVL3 (GOWN DISPOSABLE) ×6 IMPLANT
GOWN STRL REUS W/TWL LRG LVL3 (GOWN DISPOSABLE) ×9
GRAFT GORETEX STRT 7X10 (Vascular Products) ×2 IMPLANT
KIT BASIN OR (CUSTOM PROCEDURE TRAY) ×3 IMPLANT
KIT TURNOVER KIT B (KITS) ×3 IMPLANT
LOOP VESSEL MAXI BLUE (MISCELLANEOUS) ×2 IMPLANT
NDL 18GX1X1/2 (RX/OR ONLY) (NEEDLE) ×1 IMPLANT
NDL HYPO 25GX1X1/2 BEV (NEEDLE) ×1 IMPLANT
NEEDLE 18GX1X1/2 (RX/OR ONLY) (NEEDLE) ×3 IMPLANT
NEEDLE HYPO 25GX1X1/2 BEV (NEEDLE) ×3 IMPLANT
NS IRRIG 1000ML POUR BTL (IV SOLUTION) ×3 IMPLANT
PACK CV ACCESS (CUSTOM PROCEDURE TRAY) ×3 IMPLANT
PACK SURGICAL SETUP 50X90 (CUSTOM PROCEDURE TRAY) ×3 IMPLANT
PAD ARMBOARD 7.5X6 YLW CONV (MISCELLANEOUS) ×6 IMPLANT
SET COLLECT BLD 21X3/4 12 (NEEDLE) IMPLANT
SPONGE SURGIFOAM ABS GEL 100 (HEMOSTASIS) IMPLANT
STOPCOCK 4 WAY LG BORE MALE ST (IV SETS) IMPLANT
SUCTION FRAZIER HANDLE 10FR (MISCELLANEOUS) ×1
SUCTION TUBE FRAZIER 10FR DISP (MISCELLANEOUS) ×1 IMPLANT
SUT ETHILON 3 0 PS 1 (SUTURE) ×3 IMPLANT
SUT PROLENE 6 0 BV (SUTURE) ×7 IMPLANT
SUT VIC AB 3-0 SH 27 (SUTURE) ×3
SUT VIC AB 3-0 SH 27X BRD (SUTURE) ×2 IMPLANT
SUT VICRYL 4-0 PS2 18IN ABS (SUTURE) ×3 IMPLANT
SYR 10ML LL (SYRINGE) ×3 IMPLANT
SYR 20CC LL (SYRINGE) ×6 IMPLANT
SYR 5ML LL (SYRINGE) ×6 IMPLANT
SYR CONTROL 10ML LL (SYRINGE) ×3 IMPLANT
TOWEL GREEN STERILE (TOWEL DISPOSABLE) ×6 IMPLANT
TOWEL GREEN STERILE FF (TOWEL DISPOSABLE) ×3 IMPLANT
TUBING EXTENTION W/L.L. (IV SETS) IMPLANT
UNDERPAD 30X30 (UNDERPADS AND DIAPERS) ×3 IMPLANT
WATER STERILE IRR 1000ML POUR (IV SOLUTION) ×3 IMPLANT

## 2018-06-19 NOTE — Transfer of Care (Signed)
Immediate Anesthesia Transfer of Care Note  Patient: [name]  Procedure(s) Performed: THROMBECTOMY AND REVISION OF RIGHT THIGH  ARTERIOVENOUS GORE-TEX GRAFT (Right Thigh)  Patient Location: PACU  Anesthesia Type:General  Level of Consciousness: awake, oriented and patient cooperative  Airway & Oxygen Therapy: Patient Spontanous Breathing and Patient connected to nasal cannula oxygen  Post-op Assessment: Report given to RN and Post -op Vital signs reviewed and stable  Post vital signs: Reviewed  Last Vitals:  Vitals Value Taken Time  BP    Temp    Pulse    Resp    SpO2      Last Pain:  Vitals:   06/19/18 0845  TempSrc:   PainSc: 0-No pain      Patients Stated Pain Goal: 0 (19/37/90 2409)  Complications: No apparent anesthesia complications

## 2018-06-19 NOTE — Anesthesia Preprocedure Evaluation (Addendum)
Anesthesia Evaluation  Patient identified by MRN, date of birth, ID band Patient awake    Reviewed: Allergy & Precautions, NPO status , Patient's Chart, lab work & pertinent test results  Airway Mallampati: II  TM Distance: >3 FB Neck ROM: Full    Dental  (+) Teeth Intact, Dental Advisory Given   Pulmonary pneumonia, former smoker,    breath sounds clear to auscultation       Cardiovascular hypertension,  Rhythm:Regular Rate:Normal     Neuro/Psych  Neuromuscular disease    GI/Hepatic negative GI ROS, Neg liver ROS,   Endo/Other  negative endocrine ROS  Renal/GU ESRF and DialysisRenal disease     Musculoskeletal   Abdominal   Peds  Hematology  (+) anemia ,   Anesthesia Other Findings   Reproductive/Obstetrics                            Anesthesia Physical Anesthesia Plan  ASA: III  Anesthesia Plan: General   Post-op Pain Management:    Induction: Intravenous  PONV Risk Score and Plan:   Airway Management Planned: LMA  Additional Equipment:   Intra-op Plan:   Post-operative Plan: Extubation in OR  Informed Consent: I have reviewed the patients History and Physical, chart, labs and discussed the procedure including the risks, benefits and alternatives for the proposed anesthesia with the patient or authorized representative who has indicated his/her understanding and acceptance.   Dental advisory given  Plan Discussed with: CRNA and Anesthesiologist  Anesthesia Plan Comments:         Anesthesia Quick Evaluation

## 2018-06-19 NOTE — Progress Notes (Signed)
Pt returned from OR A&Ox4. Dressings clean dry and intact to right thigh. Bruit and thrill heard.

## 2018-06-19 NOTE — Op Note (Signed)
    NAME: [name]    MRN: [id number] DOB: Aug 01, 1971    DATE OF OPERATION: 06/19/2018  PREOP DIAGNOSIS:    Clotted right thigh AV graft  POSTOP DIAGNOSIS:    Same  PROCEDURE:    Thrombectomy and revision of right thigh AV graft  SURGEON: Judeth Cornfield. Scot Dock, MD, FACS  ASSIST: Laurence Slate, PA  ANESTHESIA: General  EBL: Minimal  INDICATIONS:    [name] is a 47 y.o. male who presented with a clotted graft.  This is clotted 3 times in the last week.  It is presumed that this is secondary to hypotension.  The patient has had thrombolysis twice and we were consulted for surgical thrombectomy given that this had failed both times.  FINDINGS:   Excellent thrill at the completion of the procedure.  I explored the arterial line which was widely patent.  I jumped the venous end up onto the femoral vein with a 7 mm interposition graft.  TECHNIQUE:   The patient was taken to the operating room and received a general anesthetic.  The right groin was prepped and draped in usual sterile fashion.  The previous incision in the right groin was opened and the dissection was carried down through dense scar tissue to the venous limb of the graft which was dissected free.  I then dissected the femoral vein proximal and distal to this anastomosis which was at the saphenofemoral junction.  I then dissected out the arterial limb of the graft and also the femoral artery proximal and distal to this.  The patient was then heparinized.  The graft was divided at the venous end.  The Fogarty catheter would not pass beyond the 5 o'clock position where there was a small pseudoaneurysm on the arteriogram.  I therefore clamped the femoral artery proximally distally and a transverse graftotomy was made at the arterial limb of the graft.  The Fogarty catheter passed easily multiple times through the entire length in the graft and thrombectomy was performed without difficulty.  I then  irrigated the graft from both sound with heparinized saline.  I inspected the arterial anastomosis which was widely patent.  This was a 6 mm PTFE graft.  The arteriotomy was closed with running 6-0 Prolene suture.  At the venous end I revised this with an interposition 7 mm graft.  The femoral vein was clamped proximally distally and then I extended the venotomy through the remnant of the old graft onto the femoral vein.  I then excised the graft.  A 7 mm graft was spatulated and sewn into side to the femoral vein using continuous 6-0 Prolene suture.  This graft was then spatulated and sewn into into the old graft using continuous 6-0 Prolene suture.  At the completion was an excellent thrill in the graft.  Heparin was partially reversed with protamine.  Hemostasis was obtained in the wound.  The wound was then closed with a deep layer of 3-0 Vicryl, a subcutaneous layer with 3-0 Vicryl and the skin closed with 4-0 Vicryl.  Dermabond was applied.  The patient tolerated the procedure well was transferred to the recovery room in stable condition.  All needle and sponge counts were correct.  [name], MD, FACS [facility]  DATE OF DICTATION:   06/19/2018

## 2018-06-19 NOTE — Consult Note (Signed)
   VASCULAR SURGERY  Please see consult note.  This patient has a right thigh AV graft as his last remaining option for hemodialysis access.  This graft is clotted 3 times in the last week and is undergone thrombolysis by IR.  His graft could potentially be clotting because of issues with hypotension.  However I think it would be worth an attempt at surgical thrombectomy giving that we should try to salvage this graft if at all possible.  I discussed this with the patient and also the possibility that we would have to place a tunneled dialysis catheter.  He has been added on to today's schedule.  [name], MD, [place] [phone] Office: [phone]

## 2018-06-19 NOTE — Consult Note (Addendum)
Hospital Consult  Reason for Consult:  Thrombosed thigh graft Requesting Physician:  Basilia Jumbo MRN #:  [id number]  History of Present Illness: This is a 47 y.o. male who was admitted for evaluation of his access.  His access hx included failed forearm graft/left, right AVF and left thigh graft that was thrombosed last year.  He has been using the right thigh graft but says it has stopped working.  He states that a couple of days ago, it clotted with dialysis because his blood pressure was low.  He underwent procedure in IR and went to dialysis yesterday.  It again clotted and he had another procedure.  It is once again clotted due to low blood pressure.  It has clotted again and [facility] is consulted.  Dr. Scot Dock has viewed the films from IR and will take him to the OR today for revision and thrombectomy of right thigh graft.  He has never had access in his right arm.   Past Medical History:  Diagnosis Date  . Anemia   . Anxiety   . Cough    DRY    . Depression   . ESRD on hemodialysis ([place])    HD [place] MWF  . Headache(784.0)   . Hypertension   . Muscle spasms of neck    BACK, NECK  . Pneumonia 2015ish  . Thyroid disease     Past Surgical History:  Procedure Laterality Date  . ARTERIOVENOUS GRAFT PLACEMENT Left    "forearm, it's not working; thigh"   . ARTERIOVENOUS GRAFT PLACEMENT Left 11/09/2015  . AV FISTULA PLACEMENT Right 01/10/2017   Procedure: INSERTION OF ARTERIOVENOUS Right thigh GORE-TEX GRAFT;  Surgeon: [name], MD;  Location: [place];  Service: Vascular;  Laterality: Right;  . FALSE ANEURYSM REPAIR Left 11/09/2015   Procedure: REPAIR OF LEFT FEMORAL PSEUDOANEURYSM; REVISION  OF LEFT THIGH ARTERIOVENOUS GRAFT USING 6MM X 10 CM GORETEX GRAFT ;  Surgeon: [name], MD;  Location: [place];  Service: Vascular;  Laterality: Left;  . IR GENERIC HISTORICAL  07/15/2016   IR US GUIDE VASC ACCESS LEFT 07/15/2016 [name], MD [facility]  . IR GENERIC  HISTORICAL Left 07/15/2016   IR THROMBECTOMY AV FISTULA W/THROMBOLYSIS/PTA INC/SHUNT/IMG LEFT 07/15/2016 [name], MD [facility]  . IR GENERIC HISTORICAL  10/05/2016   IR US GUIDE VASC ACCESS LEFT 10/05/2016 [name], MD [facility]  . IR GENERIC HISTORICAL Left 10/05/2016   IR THROMBECTOMY AV FISTULA W/THROMBOLYSIS/PTA INC/SHUNT/IMG LEFT 10/05/2016 [name], MD [facility]  . IR GENERIC HISTORICAL  10/08/2016   IR FLUORO GUIDE CV LINE RIGHT 10/08/2016 [name], MD [facility]  . IR GENERIC HISTORICAL  10/08/2016   IR US GUIDE VASC ACCESS RIGHT 10/08/2016 [name], MD [facility]  . IR REMOVAL TUN CV CATH W/O FL  03/16/2017  . IR THROMBECTOMY AV FISTULA W/THROMBOLYSIS/PTA INC/SHUNT/IMG RIGHT Right 06/16/2018  . IR THROMBECTOMY AV FISTULA W/THROMBOLYSIS/PTA INC/SHUNT/IMG RIGHT Right 06/17/2018  . IR US GUIDE VASC ACCESS RIGHT  06/17/2018  . IR US GUIDE VASC ACCESS RIGHT  06/16/2018  . PARATHYROIDECTOMY N/A 04/24/2014   Procedure: TOTAL 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;  Surgeon: [name], MD;  Location: [place];  Service: General;  Laterality: N/A;  NECK AND LEFT FOREARM  . PERIPHERAL VASCULAR CATHETERIZATION N/A 05/05/2016   Procedure: A/V Shuntogram;  Surgeon: Conrad Uniondale, MD;  Location: [place] CV LAB;  Service: Cardiovascular;  Laterality: N/A;  . PERIPHERAL VASCULAR CATHETERIZATION  Left 05/05/2016   Procedure: Peripheral Vascular Balloon Angioplasty;  Surgeon: Conrad Chino, MD;  Location: [place] CV LAB;  Service: Cardiovascular;  Laterality: Left;  . PSEUDOANEURYSM REPAIR Left 11/09/2015  . THROMBECTOMY / ARTERIOVENOUS GRAFT REVISION Left 08/18/2015   thigh  . THROMBECTOMY AND REVISION OF ARTERIOVENTOUS (AV) GORETEX  GRAFT Left 08/23/2015   Procedure: THROMBECTOMY AND REVISION OF ARTERIOVENTOUS (AV) GORETEX  GRAFT LEFT THIGH ;  Surgeon: [name], MD;  Location: [place];  Service: Vascular;  Laterality: Left;  . THROMBECTOMY W/ EMBOLECTOMY  Left 08/18/2015   Procedure: THROMBECTOMY  AND REVISION ARTERIOVENOUS GORE-TEX GRAFT/LEFT THIGH;  Surgeon: [name], MD;  Location: [place];  Service: Vascular;  Laterality: Left;  . UPPER EXTREMITY VENOGRAPHY Bilateral 12/27/2016   Procedure: Upper Extremity Venography;  Surgeon: [name], MD;  Location: [place] CV LAB;  Service: Cardiovascular;  Laterality: Bilateral;  . VENOGRAM Left 05/05/2016   Procedure: Venogram;  Surgeon: Conrad [place], MD;  Location: [place] CV LAB;  Service: Cardiovascular;  Laterality: Left;  lower extremity    Allergies  Allergen Reactions  . Lisinopril Cough  . Ivp Dye [Iodinated Diagnostic Agents] Swelling    SWELLING REACTION UNSPECIFIED   . Solu-Medrol [Methylprednisolone] Nausea And Vomiting    Prior to Admission medications   Medication Sig Start Date End Date Taking? Authorizing Provider  acetaminophen (TYLENOL) 500 MG tablet Take 1,000 mg by mouth daily as needed (for pain or headaches).    Yes [provider]  calcium elemental as carbonate (TUMS ULTRA 1000) 400 MG chewable tablet Chew 2,000-4,000 mg by mouth See admin instructions. Chew 4 tablets (4,000 mg) by mouth 3 times daily with meals and 2 tablets (2,000 mg) with snacks   Yes [provider]  benzonatate (TESSALON) 100 MG capsule Take 1 capsule (100 mg total) by mouth every 8 (eight) hours. Patient not taking: Reported on 06/15/2018 07/25/17   Larene Pickett, PA-C  fluticasone [facility]) 50 MCG/ACT nasal spray Place 2 sprays into both nostrils daily. Patient not taking: Reported on 06/15/2018 07/25/17   Larene Pickett, PA-C  oxyCODONE-acetaminophen (PERCOCET/ROXICET) 5-325 MG tablet Take 1 tablet by mouth every 4 (four) hours as needed for moderate pain. Patient not taking: Reported on 06/15/2018 01/11/17   Ulyses Amor, PA-C    Social History   Socioeconomic History  . Marital status: Married    Spouse name: Not on file  . Number of children: Not on file   . Years of education: Not on file  . Highest education level: Not on file  Occupational History  . Not on file  Social Needs  . Financial resource strain: Not on file  . Food insecurity:    Worry: Not on file    Inability: Not on file  . Transportation needs:    Medical: Not on file    Non-medical: Not on file  Tobacco Use  . Smoking status: Former Smoker    Last attempt to quit: 03/21/1986    Years since quitting: 32.2  . Smokeless tobacco: Never Used  Substance and Sexual Activity  . Alcohol use: No    Alcohol/week: 0.0 standard drinks  . Drug use: No  . Sexual activity: Not on file  Lifestyle  . Physical activity:    Days per week: Not on file    Minutes per session: Not on file  . Stress: Not on file  Relationships  . Social connections:    Talks on phone:  Not on file    Gets together: Not on file    Attends religious service: Not on file    Active member of club or organization: Not on file    Attends meetings of clubs or organizations: Not on file    Relationship status: Not on file  . Intimate partner violence:    Fear of current or ex partner: Not on file    Emotionally abused: Not on file    Physically abused: Not on file    Forced sexual activity: Not on file  Other Topics Concern  . Not on file  Social History Narrative  . Not on file     Family History  Problem Relation Age of Onset  . Renal Disease Neg Hx     ROS: [x]  Positive   [ ]  Negative   [ ]  All sytems reviewed and are negative  Cardiac: []  chest pain/pressure []  palpitations []  SOB lying flat []  DOE  Vascular: []  pain in legs while walking []  pain in legs at rest []  pain in legs at night []  non-healing ulcers []  hx of DVT []  swelling in legs  Pulmonary: []  productive cough []  asthma/wheezing []  home O2  Neurologic: []  weakness in []  arms []  legs []  numbness in []  arms []  legs []  hx of CVA []  mini stroke [] difficulty speaking or slurred speech []  temporary loss of  vision in one eye []  dizziness  Hematologic: []  hx of cancer []  bleeding problems []  problems with blood clotting easily [x]  anemia  Endocrine:   []  diabetes [x]  thyroid disease  GI []  vomiting blood []  blood in stool  GU: [x]  CKD/renal failure [x]  HD []  burning with urination []  blood in urine  Psychiatric: []  anxiety [x]  depression  Musculoskeletal: []  arthritis []  joint pain  Integumentary: []  rashes []  ulcers  Constitutional: []  fever []  chills   Physical Examination  Vitals:   06/19/18 0025 06/19/18 0503  BP: (!) 96/51 94/62  Pulse:  80  Resp:  17  Temp:  97.8 F (36.6 C)  SpO2:  98%   Body mass index is 23.68 kg/m.  General:  WDWN in NAD Gait: Not observed HENT: WNL, normocephalic Pulmonary: normal non-labored breathing, without Rales, rhonchi,  wheezing Cardiac: regular, without  Murmurs Skin: without rashes Vascular Exam/Pulses:  Right Left  Radial 2+ (normal) 2+ (normal)  Ulnar Unable to palpate  Unable to palpate    Extremities: old clotted graft placed left arm; clotted left thigh graft; right thigh graft without thrill or bruit.   Musculoskeletal: no muscle wasting or atrophy  Neurologic: A&O X 3;  No focal weakness or paresthesias are detected; speech is fluent/normal Psychiatric:  The pt has Normal affect.   CBC    Component Value Date/Time   WBC 3.8 (L) 06/18/2018 1438   RBC 3.88 (L) 06/18/2018 1438   HGB 11.9 (L) 06/18/2018 1438   HCT 37.1 (L) 06/18/2018 1438   PLT 193 06/18/2018 1438   MCV 95.6 06/18/2018 1438   MCH 30.7 06/18/2018 1438   MCHC 32.1 06/18/2018 1438   RDW 13.6 06/18/2018 1438   LYMPHSABS 1.3 06/14/2018 1944   MONOABS 0.7 06/14/2018 1944   EOSABS 0.1 06/14/2018 1944   BASOSABS 0.1 06/14/2018 1944    BMET    Component Value Date/Time   NA 136 06/18/2018 1438   K 4.7 06/18/2018 1438   CL 93 (L) 06/18/2018 1438   CO2 24 06/18/2018 1438   GLUCOSE 139 (H) 06/18/2018 1438  BUN 66 (H) 06/18/2018  1438   CREATININE 20.29 (H) 06/18/2018 1438   CALCIUM 8.7 (L) 06/18/2018 1438   CALCIUM 8.7 02/16/2010 0827   GFRNONAA 2 (L) 06/18/2018 1438   GFRAA 3 (L) 06/18/2018 1438    COAGS: Lab Results  Component Value Date   INR 1.02 10/15/2016   INR 0.98 07/15/2016   INR 1.05 07/27/2015     Invasive Vascular Imaging:   IR intervention 06/16/18: IMPRESSION: 1. Technically successful right thigh AV graft thrombolysis. 2. Successful angioplasty of the venous anastomosis and majority of the venous limb to 6 mm diameter. Completion shuntogram demonstrates the venous limb and anastomosis are widely patent. 3. The arterial anastomosis and arterial limb are widely patent. 4. The central venous system is widely patent.  ACCESS: Given early graft thrombosis (patient underwent technically successful graft declot procedure day prior on 06/16/2018), would recommend surgical revision if the graft were to rethrombosis within the next 4-6 weeks.  Statin:  No. Beta Blocker:  No. Aspirin:  No. ACEI:  No. ARB:  No. CCB use:  No Other antiplatelets/anticoagulants:  No.   ASSESSMENT/PLAN: This is a 47 y.o. male with ESRD with right thigh graft that has failed intervention in IR and VVS is consulted  -Dr. Scot Dock reviewed films from IR-will plan to revise a section of the graft and perform thrombectomy.  Most likely clotting due to hypotension.   -of note, if right thigh graft fails, may be beneficial to repeat upper extremity vein mapping as he has not had access in the right arm.  Leontine Locket, PA-C [facility] [phone]  I have interviewed the patient and examined the patient. I agree with the findings by the PA.  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  This patient has a right thigh AV graft as his  last remaining option for hemodialysis access.  This graft is clotted 3 times in the last week and is undergone thrombolysis by IR.  His graft could potentially be clotting because of issues with hypotension.  However I think it would be worth an attempt at surgical thrombectomy giving that we should try to salvage this graft if at all possible.  I discussed this with the patient and also the possibility that we would have to place a tunneled dialysis catheter.  He has been added on to today's schedule.    [name], MD, [place] [phone] Office: [place], [place] [phone]

## 2018-06-19 NOTE — Progress Notes (Signed)
PROGRESS NOTE        PATIENT DETAILS Name: [name] Age: 47 y.o. Sex: male Date of Birth: 1971-01-23 Admit Date: 06/14/2018 Admitting Physician Vianne Bulls, MD [id number], No Pcp Per  Brief Narrative: Patient is a 47 y.o. male with history of ESRD-ED by his outpatient dialysis center for malfunction of his dialysis access .  He was subsequently admitted for further evaluation and treatment.  He scheduled for declot by IR on 9/14.  Subjective: Patient in bed, appears comfortable, denies any headache, no fever, no chest pain or pressure, no shortness of breath , no abdominal pain. No focal weakness.  His right thigh AV graft has stopped working again.  Assessment/Plan: Problem with vascular access for HD: With clotting of the right thigh AV graft, underwent declotting procedure on 06/16/2018 followed by successful dialysis session on same date, unfortunately his graft has again re-clotted, IR was reconsulted and he went another declotting procedure on 06/17/2018, got dialyzed again and postdialysis is clotted yet again.  At this time vascular surgery has been consulted.  Continue to monitor in the hospital hold discharge.  ESRD: Usual schedule for HD is MWF, due to access issues-last HD was this past Monday.  Although BUN slowly rising-he is awake and alert.  Recheck electrolytes today.  Mild hyperkalemia: Resolved with HD   DVT Prophylaxis: Prophylactic Heparin   Code Status: Full code   Family Communication: None at bedside  Disposition Plan: Remain inpatient-home most likely tomorrow  Antimicrobial agents: Anti-infectives (From admission, onward)   Start     Dose/Rate Route Frequency Ordered Stop   06/19/18 1230  cefUROXime (ZINACEF) 1.5 g in sodium chloride 0.9 % 100 mL IVPB     1.5 g 200 mL/hr over 30 Minutes Intravenous To [facility] 06/19/18 1022 06/20/18 1230      Procedures: Declotting of the right AV graft x2  this admission by IR  CONSULTS:  nephrology  Time spent: 25- minutes-Greater than 50% of this time was spent in counseling, explanation of diagnosis, planning of further management, and coordination of care.  MEDICATIONS: Scheduled Meds: . [MAR Hold] calcium carbonate  8 tablet Oral TID WC  . [MAR Hold] Chlorhexidine Gluconate Cloth  6 each Topical Q0600  . [MAR Hold] heparin  5,000 Units Subcutaneous Q8H  . [MAR Hold] sodium chloride flush  3 mL Intravenous Q12H  . [MAR Hold] sodium chloride flush  3 mL Intravenous Q12H   Continuous Infusions: . [MAR Hold] sodium chloride    . sodium chloride    . cefUROXime (ZINACEF)  IV     PRN Meds:.[MAR Hold] sodium chloride, [MAR Hold] acetaminophen **OR** [MAR Hold] acetaminophen, [MAR Hold] calcium carbonate **AND** [MAR Hold] calcium carbonate, [MAR Hold] ondansetron **OR** [MAR Hold] ondansetron (ZOFRAN) IV, [MAR Hold] senna-docusate, [MAR Hold] sodium chloride flush   PHYSICAL EXAM: Vital signs: Vitals:   06/18/18 2305 06/19/18 0025 06/19/18 0502 06/19/18 0503  BP: (!) 81/48 (!) 96/51  94/62  Pulse: (!) 103   80  Resp: 17   17  Temp: 98.4 F (36.9 C)   97.8 F (36.6 C)  TempSrc: Oral   Oral  SpO2: 97%   98%  Weight:   79.2 kg   Height:       Filed Weights   06/18/18 1400 06/18/18 1900 06/19/18 0502  Weight: 79.3 kg 76.7 kg  79.2 kg   Body mass index is 23.68 kg/m.   Exam  Awake Alert, Oriented X 3, No new F.N deficits, Normal affect [place].AT,PERRAL Supple Neck,No JVD, No cervical lymphadenopathy appriciated.  Symmetrical Chest wall movement, Good air movement bilaterally, CTAB RRR,No Gallops, Rubs or new Murmurs, No Parasternal Heave +ve B.Sounds, Abd Soft, No tenderness, No organomegaly appriciated, No rebound - guarding or rigidity. No Cyanosis, Clubbing or edema, No new Rash or bruise Right thigh graft has no thrill  I have personally reviewed following labs and imaging studies  LABORATORY DATA: CBC: Recent Labs    Lab 06/14/18 1944 06/14/18 1957 06/16/18 1954 06/18/18 1032 06/18/18 1438 06/19/18 1038  WBC 5.8  --  5.2 3.8* 3.8* 5.5  NEUTROABS 3.7  --   --   --   --   --   HGB 13.1 14.3 12.0* 13.1 11.9* 12.8*  HCT 41.2 42.0 36.9* 40.7 37.1* 39.4  MCV 97.2  --  94.9 95.5 95.6 95.6  PLT 237  --  249 221 193 768    Basic Metabolic Panel: Recent Labs  Lab 06/15/18 0652 06/16/18 0926 06/17/18 1147 06/18/18 1438 06/19/18 1038  NA 140 138 136 136 135  K 4.7 5.8* 4.7 4.7 5.2*  CL 98 95* 94* 93* 94*  CO2 18* 18* 21* 24 23  GLUCOSE 83 73 113* 139* 91  BUN 91* 105* 54* 66* 35*  CREATININE 23.51* 25.42* 17.37* 20.29* 13.80*  CALCIUM 8.0* 8.2* 8.5* 8.7* 8.3*  PHOS 6.9*  --  6.7* 5.9*  --     GFR: Estimated Creatinine Clearance: 7.4 mL/min (A) (by C-G formula based on SCr of 13.8 mg/dL (H)).  Liver Function Tests: Recent Labs  Lab 06/15/18 0652 06/17/18 1147 06/18/18 1438  ALBUMIN 3.6 3.6 3.3*   No results for input(s): LIPASE, AMYLASE in the last 168 hours. No results for input(s): AMMONIA in the last 168 hours.  Coagulation Profile: No results for input(s): INR, PROTIME in the last 168 hours.  Cardiac Enzymes: No results for input(s): CKTOTAL, CKMB, CKMBINDEX, TROPONINI in the last 168 hours.  BNP (last 3 results) No results for input(s): PROBNP in the last 8760 hours.  HbA1C: No results for input(s): HGBA1C in the last 72 hours.  CBG: Recent Labs  Lab 06/17/18 0024 06/17/18 0142  GLUCAP 65* 158*    Lipid Profile: No results for input(s): CHOL, HDL, LDLCALC, TRIG, CHOLHDL, LDLDIRECT in the last 72 hours.  Thyroid Function Tests: No results for input(s): TSH, T4TOTAL, FREET4, T3FREE, THYROIDAB in the last 72 hours.  Anemia Panel: No results for input(s): VITAMINB12, FOLATE, FERRITIN, TIBC, IRON, RETICCTPCT in the last 72 hours.  Urine analysis:    Component Value Date/Time   COLORURINE YELLOW 08/01/2009 0742   APPEARANCEUR TURBID (A) 08/01/2009 0742    LABSPEC 1.018 08/01/2009 0742   PHURINE 5.5 08/01/2009 0742   GLUCOSEU NEGATIVE 08/01/2009 0742   HGBUR LARGE (A) 08/01/2009 0742   BILIRUBINUR SMALL (A) 08/01/2009 0742   KETONESUR 15 (A) 08/01/2009 0742   PROTEINUR 100 (A) 08/01/2009 0742   UROBILINOGEN 1.0 08/01/2009 0742   NITRITE NEGATIVE 08/01/2009 0742   LEUKOCYTESUR LARGE (A) 08/01/2009 0742    Sepsis Labs: Lactic Acid, Venous    Component Value Date/Time   LATICACIDVEN 1.14 06/14/2018 2149    MICROBIOLOGY: Recent Results (from the past 240 hour(s))  MRSA PCR Screening     Status: None   Collection Time: 06/15/18  3:28 AM  Result Value Ref Range Status  MRSA by PCR NEGATIVE NEGATIVE Final    Comment:        The GeneXpert MRSA Assay (FDA approved for NASAL specimens only), is one component of a comprehensive MRSA colonization surveillance program. It is not intended to diagnose MRSA infection nor to guide or monitor treatment for MRSA infections. Performed at [facility], [place] [address], [place], [place] [id number]     RADIOLOGY STUDIES/RESULTS: Ir US Guide Vasc Access Right  Result Date: 06/18/2018 Study Result INDICATION: Clotted graft.  End-stage renal disease, currently receiving dialysis via right thigh dialysis graft.  Patient underwent technically successful 5 dialysis graft yesterday, 06/16/2018, and while receiving a complete dialysis session after graft lysis, the patient states he experienced a transient episode of hypotension which has resulted in early graft rethrombosis.  As such, patient presents today for repeat attempted graft thrombolysis.  EXAM: 1. FISTULALYSIS 2. ANGIOPLASTY OF VENOUS LIMB AND VENOUS ANASTOMOSIS 3. ULTRASOUND GUIDANCE FOR VENOUS ACCESS  COMPARISON:  Declot-06/16/2018  MEDICATIONS: Heparin 4000 units IV; TPA 4 mg into graft.  CONTRAST:  50 cc Isovue-300  ANESTHESIA/SEDATION: Moderate (conscious) sedation was employed during this procedure. A total of Versed 5 mg and  Fentanyl 200 mcg was administered intravenously.  Moderate Sedation Time: 42 minutes. The patient's level of consciousness and vital signs were monitored continuously by radiology nursing throughout the procedure under my direct supervision.  FLUOROSCOPY TIME:  4 minutes, 12 seconds (44 mGy)  COMPLICATIONS: None immediate.  TECHNIQUE: Informed written consent was obtained from the patient after a discussion of the risks, benefits and alternatives to treatment. Questions regarding the procedure were encouraged and answered. A timeout was performed prior to the initiation of the procedure.  On physical examination, the existing right thigh dialysis graft was negative for palpable pulse or thrill. The skin overlying the graft was prepped and draped in the usual sterile fashion, and a sterile drape was applied covering the operative field. Maximum barrier sterile technique with sterile gowns and gloves were used for the procedure.  Under ultrasound guidance, the dialysis graft was accessed directed towards the venous anastomosis with a micropuncture kit after the overlying soft tissues were anesthetized with 1% lidocaine. An ultrasound image was saved for documentation purposes. The micropuncture sheath was exchange for a 7-French vascular sheath over a guidewire. Over a Benson wire, a Kumpe catheter was advanced centrally and a central venogram was performed. Pull back venogram was performed with the Kumpe catheter. Heparin was administered systemically and TPA was administered via the Kumpe catheter throughout near the entirety of the venous limb.  The venous anastomosis and majority of the venous limb was angioplastied with a 6 mm x 4 cm Conquest balloon. An additional access was obtained directed towards the arterial anastomoses with a micropuncture kit after the overlying soft tissues anesthetized with 1% lidocaine. This allowed for placement of a 6-French vascular sheath. The graft was thrombectomized with  several rounds of push-pull mechanical thrombectomy with an occlusion balloon. Flow was restored to the graft as evidenced by restored pulsatility of the graft and brisk blood return from the side arm of the vascular sheath. Shuntograms were performed.  Repeat angioplasty was performed with the 6 mm x 4 cm Conquest balloon at 2 sites of extravasation within the stick zone of the graft, one of which appear to communicate with a draining superficial vein.  Completion images were obtained and the procedure was terminated.  At this point, the procedure was terminated. All wires, catheters and sheaths were  removed from the patient. Hemostasis was achieved at both access sites with deployment of a swizzle sutures which will be removed at the patient's next dialysis session. Dressings were placed. The patient tolerated the procedure without immediate postprocedural complication.  FINDINGS: The existing right thigh AV graft is thrombosed. The venous anastomosis and near the entirety of the venous limb was angioplastied to 6 mm diameter. The graft was successfully thrombectomized using mechanical and pharmacologic means as above.  Prolonged angioplasty was performed at 2 small areas of extravasation within the stick zone of the graft, 1 of which appear to communicate with a draining superficial vein.  Completion shuntogram demonstrates the venous limb and anastomosis are widely patent with tiny areas of extravasation at previous dialysis graft access sites which was controlled with additional manual compression. 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  The central venous system, arterial limb and arterial anastomosis are widely patent.  IMPRESSION: 1. Technically successful right thigh AV graft thrombolysis. 2. Successful angioplasty of the venous anastomosis and majority of the venous limb to 6 mm diameter. Completion  shuntogram demonstrates the venous limb and anastomosis are widely patent. 3. The arterial anastomosis and arterial limb are widely patent. 4. The central venous system is widely patent.  ACCESS: Given early graft thrombosis (patient underwent technically successful graft declot procedure day prior on 06/16/2018), would recommend surgical revision if the graft were to rethrombosis within the next 4-6 weeks.   Electronically Signed   By: [name] M.D.   On: 06/17/2018 18:40    Ir US Guide Vasc Access Right  Result Date: 06/17/2018 INDICATION: Clotted graft. End-stage renal disease, currently receiving dialysis via right thigh dialysis graft. Patient underwent technically successful 5 dialysis graft yesterday, 06/16/2018, and while receiving a complete dialysis session after graft lysis, the patient states he experienced a transient episode of hypotension which has resulted in early graft rethrombosis. As such, patient presents today for repeat attempted graft thrombolysis. EXAM: 1. FISTULALYSIS 2. ANGIOPLASTY OF VENOUS LIMB AND VENOUS ANASTOMOSIS 3. ULTRASOUND GUIDANCE FOR VENOUS ACCESS COMPARISON:  Declot-06/16/2018 MEDICATIONS: Heparin 4000 units IV; TPA 4 mg into graft. CONTRAST:  50 cc Isovue-300 ANESTHESIA/SEDATION: Moderate (conscious) sedation was employed during this procedure. A total of Versed 5 mg and Fentanyl 200 mcg was administered intravenously. Moderate Sedation Time: 42 minutes. The patient's level of consciousness and vital signs were monitored continuously by radiology nursing throughout the procedure under my direct supervision. FLUOROSCOPY TIME:  4 minutes, 12 seconds (44 mGy) COMPLICATIONS: None immediate. TECHNIQUE: Informed written consent was obtained from the patient after a discussion of the risks, benefits and alternatives to treatment. Questions regarding the procedure were encouraged and answered. A timeout was performed prior to the initiation of the procedure. On physical  examination, the existing right thigh dialysis graft was negative for palpable pulse or thrill. The skin overlying the graft was prepped and draped in the usual sterile fashion, and a sterile drape was applied covering the operative field. Maximum barrier sterile technique with sterile gowns and gloves were used for the procedure. Under ultrasound guidance, the dialysis graft was accessed directed towards the venous anastomosis with a micropuncture kit after the overlying soft tissues were anesthetized with 1% lidocaine. An ultrasound image was saved for documentation purposes. The micropuncture sheath was exchange for a 7-French vascular sheath over a guidewire. Over a Benson wire, a Kumpe catheter was advanced centrally  and a central venogram was performed. Pull back venogram was performed with the Kumpe catheter. Heparin was administered systemically and TPA was administered via the Kumpe catheter throughout near the entirety of the venous limb. The venous anastomosis and majority of the venous limb was angioplastied with a 6 mm x 4 cm Conquest balloon. An additional access was obtained directed towards the arterial anastomoses with a micropuncture kit after the overlying soft tissues anesthetized with 1% lidocaine. This allowed for placement of a 6-French vascular sheath. The graft was thrombectomized with several rounds of push-pull mechanical thrombectomy with an occlusion balloon. Flow was restored to the graft as evidenced by restored pulsatility of the graft and brisk blood return from the side arm of the vascular sheath. Shuntograms were performed. Repeat angioplasty was performed with the 6 mm x 4 cm Conquest balloon at 2 sites of extravasation within the stick zone of the graft, one of which appear to communicate with a draining superficial vein. Completion images were obtained and the procedure was terminated. At this point, the procedure was terminated. All wires, catheters and sheaths were removed  from the patient. Hemostasis was achieved at both access sites with deployment of a swizzle sutures which will be removed at the patient's next dialysis session. Dressings were placed. The patient tolerated the procedure without immediate postprocedural complication. FINDINGS: The existing right thigh AV graft is thrombosed. The venous anastomosis and near the entirety of the venous limb was angioplastied to 6 mm diameter. The graft was successfully thrombectomized using mechanical and pharmacologic means as above. Prolonged angioplasty was performed at 2 small areas of extravasation within the stick zone of the graft, 1 of which appear to communicate with a draining superficial vein. Completion shuntogram demonstrates the venous limb and anastomosis are widely patent with tiny areas of extravasation at previous dialysis graft access sites which was controlled with additional manual compression. 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 The central venous system, arterial limb and arterial anastomosis are widely patent. IMPRESSION: 1. Technically successful right thigh AV graft thrombolysis. 2. Successful angioplasty of the venous anastomosis and majority of the venous limb to 6 mm diameter. Completion shuntogram demonstrates the venous limb and anastomosis are widely patent. 3. The arterial anastomosis and arterial limb are widely patent. 4. The central venous system is widely patent. ACCESS: Given early graft thrombosis (patient underwent technically successful graft declot procedure day prior on 06/16/2018), would recommend surgical revision if the graft were to rethrombosis within the next 4-6 weeks. Electronically Signed   By: [name] M.D.   On: 06/17/2018 18:40   Ir Thrombectomy Av Fistula/w Thrombolysis/pta Inc Shunt/img Right  Result Date: 06/17/2018 INDICATION: Clotted graft. End-stage renal disease,  currently receiving dialysis via right thigh dialysis graft. Patient underwent technically successful 5 dialysis graft yesterday, 06/16/2018, and while receiving a complete dialysis session after graft lysis, the patient states he experienced a transient episode of hypotension which has resulted in early graft rethrombosis. As such, patient presents today for repeat attempted graft thrombolysis. EXAM: 1. FISTULALYSIS 2. ANGIOPLASTY OF VENOUS LIMB AND VENOUS ANASTOMOSIS 3. ULTRASOUND GUIDANCE FOR VENOUS ACCESS COMPARISON:  Declot-06/16/2018 MEDICATIONS: Heparin 4000 units IV; TPA 4 mg into graft. CONTRAST:  50 cc Isovue-300 ANESTHESIA/SEDATION: Moderate (conscious) sedation was employed during this procedure. A total of Versed 5 mg and Fentanyl 200 mcg was administered intravenously. Moderate Sedation Time: 42 minutes. The patient's  level of consciousness and vital signs were monitored continuously by radiology nursing throughout the procedure under my direct supervision. FLUOROSCOPY TIME:  4 minutes, 12 seconds (44 mGy) COMPLICATIONS: None immediate. TECHNIQUE: Informed written consent was obtained from the patient after a discussion of the risks, benefits and alternatives to treatment. Questions regarding the procedure were encouraged and answered. A timeout was performed prior to the initiation of the procedure. On physical examination, the existing right thigh dialysis graft was negative for palpable pulse or thrill. The skin overlying the graft was prepped and draped in the usual sterile fashion, and a sterile drape was applied covering the operative field. Maximum barrier sterile technique with sterile gowns and gloves were used for the procedure. Under ultrasound guidance, the dialysis graft was accessed directed towards the venous anastomosis with a micropuncture kit after the overlying soft tissues were anesthetized with 1% lidocaine. An ultrasound image was saved for documentation purposes. The  micropuncture sheath was exchange for a 7-French vascular sheath over a guidewire. Over a Benson wire, a Kumpe catheter was advanced centrally and a central venogram was performed. Pull back venogram was performed with the Kumpe catheter. Heparin was administered systemically and TPA was administered via the Kumpe catheter throughout near the entirety of the venous limb. The venous anastomosis and majority of the venous limb was angioplastied with a 6 mm x 4 cm Conquest balloon. An additional access was obtained directed towards the arterial anastomoses with a micropuncture kit after the overlying soft tissues anesthetized with 1% lidocaine. This allowed for placement of a 6-French vascular sheath. The graft was thrombectomized with several rounds of push-pull mechanical thrombectomy with an occlusion balloon. Flow was restored to the graft as evidenced by restored pulsatility of the graft and brisk blood return from the side arm of the vascular sheath. Shuntograms were performed. Repeat angioplasty was performed with the 6 mm x 4 cm Conquest balloon at 2 sites of extravasation within the stick zone of the graft, one of which appear to communicate with a draining superficial vein. Completion images were obtained and the procedure was terminated. At this point, the procedure was terminated. All wires, catheters and sheaths were removed from the patient. Hemostasis was achieved at both access sites with deployment of a swizzle sutures which will be removed at the patient's next dialysis session. Dressings were placed. The patient tolerated the procedure without immediate postprocedural complication. FINDINGS: The existing right thigh AV graft is thrombosed. The venous anastomosis and near the entirety of the venous limb was angioplastied to 6 mm diameter. The graft was successfully thrombectomized using mechanical and pharmacologic means as above. Prolonged angioplasty was performed at 2 small areas of extravasation  within the stick zone of the graft, 1 of which appear to communicate with a draining superficial vein. Completion shuntogram demonstrates the venous limb and anastomosis are widely patent with tiny areas of extravasation at previous dialysis graft access sites which was controlled with additional manual compression. 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 The central venous system, arterial limb and arterial anastomosis are widely patent. IMPRESSION: 1. Technically successful right thigh AV graft thrombolysis. 2. Successful angioplasty of the venous anastomosis and majority of the venous limb to 6 mm diameter. Completion shuntogram demonstrates the venous limb and anastomosis are widely patent. 3. The arterial anastomosis and arterial limb are widely patent. 4. The central venous system is widely patent. ACCESS:  Given early graft thrombosis (patient underwent technically successful graft declot procedure day prior on 06/16/2018), would recommend surgical revision if the graft were to rethrombosis within the next 4-6 weeks. Electronically Signed   By: [name] M.D.   On: 06/17/2018 18:40   Ir Thrombectomy Av Fistula/w Thrombolysis/pta Inc Shunt/img Right  Result Date: 06/16/2018 INDICATION: Clotted graft. End-stage renal disease, currently receiving dialysis via right thigh dialysis graft. Patient's dialysis graft has been used for the past 1 year. Patient denies previous intervention or thrombosis of the right thigh dialysis graft. EXAM: 1. FISTULALYSIS 2. ANGIOPLASTY OF VENOUS LIMB AND VENOUS ANASTOMOSIS 3. ULTRASOUND GUIDANCE FOR VENOUS ACCESS COMPARISON:  None. MEDICATIONS: Heparin 4000 units IV; TPA 4 mg into graft. CONTRAST:  40 cc Isovue-300 ANESTHESIA/SEDATION: Moderate (conscious) sedation was employed during this procedure. A total of Versed 4 mg and Fentanyl 100 mcg was administered intravenously.  Moderate Sedation Time: 28 minutes. The patient's level of consciousness and vital signs were monitored continuously by radiology nursing throughout the procedure under my direct supervision. FLUOROSCOPY TIME:  4 minutes 36 seconds (48 mGy) COMPLICATIONS: None immediate. TECHNIQUE: Informed written consent was obtained from the patient after a discussion of the risks, benefits and alternatives to treatment. Questions regarding the procedure were encouraged and answered. A timeout was performed prior to the initiation of the procedure. On physical examination, the existing right thigh dialysis graft was negative for palpable pulse or thrill. The skin overlying the graft was prepped and draped in the usual sterile fashion, and a sterile drape was applied covering the operative field. Maximum barrier sterile technique with sterile gowns and gloves were used for the procedure. Under ultrasound guidance, the dialysis graft was accessed directed towards the venous anastomosis with a micropuncture kit after the overlying soft tissues were anesthetized with 1% lidocaine. An ultrasound image was saved for documentation purposes. The micropuncture sheath was exchange for a 7-French vascular sheath over a guidewire. Over a Benson wire, a Kumpe catheter was advanced centrally and a central venogram was performed. Pull back venogram was performed with the Kumpe catheter. Heparin was administered systemically and TPA was administered via the Kumpe catheter throughout near the entirety of the venous limb. The venous anastomosis and majority of the venous limb was angioplastied with a 6 mm x 4 cm Conquest balloon. An additional access was obtained directed towards the arterial anastomoses with a micropuncture kit after the overlying soft tissues anesthetized with 1% lidocaine. This allowed for placement of a 6-French vascular sheath. The graft was thrombectomized with several rounds of push-pull mechanical thrombectomy with an  occlusion balloon. Flow was restored to the graft as evidenced by restored pulsatility of the graft and brisk blood return from the side arm of the vascular sheath. Shuntograms were performed. At this point, the procedure was terminated. All wires, catheters and sheaths were removed from the patient. Hemostasis was achieved at both access sites with deployment of a swizzle sutures which will be removed at the patient's next dialysis session. Dressings were placed. The patient tolerated the procedure without immediate postprocedural complication. FINDINGS: The existing right thigh AV graft is thrombosed. The venous anastomosis and near the entirety of the venous limb was angioplastied to 6 mm diameter. The graft was successfully thrombectomized using mechanical and pharmacologic means as above. Completion shuntogram demonstrates the venous limb and anastomosis are widely patent. 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 The central venous system, arterial  limb and arterial anastomosis are widely patent. IMPRESSION: 1. Technically successful right thigh AV graft thrombolysis. 2. Successful angioplasty of the venous anastomosis and majority of the venous limb to 6 mm diameter. Completion shuntogram demonstrates the venous limb and anastomosis are widely patent. 3. The arterial anastomosis and arterial limb are widely patent. 4. The central venous system is widely patent. ACCESS: This graft would be amenable to future percutaneous intervention as clinically indicated. Electronically Signed   By: [name] M.D.   On: 06/16/2018 15:35     LOS: 3 days   Lala Lund, MD  [facility]  If 7PM-7AM, please contact night-coverage  Please page via [email or web address]-click on MD name and type text message  06/19/2018, 12:56 PM

## 2018-06-19 NOTE — Anesthesia Procedure Notes (Signed)
Procedure Name: LMA Insertion Date/Time: 06/19/2018 2:04 PM Performed by: Jenne Campus, CRNA Pre-anesthesia Checklist: Patient identified, Emergency Drugs available, Suction available and Patient being monitored Patient Re-evaluated:Patient Re-evaluated prior to induction Oxygen Delivery Method: Circle System Utilized Preoxygenation: Pre-oxygenation with 100% oxygen Induction Type: IV induction Ventilation: Mask ventilation without difficulty LMA: LMA inserted LMA Size: 5.0 Number of attempts: 1 Airway Equipment and Method: Bite block Placement Confirmation: positive ETCO2 and breath sounds checked- equal and bilateral Tube secured with: Tape Dental Injury: Teeth and Oropharynx as per pre-operative assessment

## 2018-06-19 NOTE — Progress Notes (Addendum)
[facility] Progress Note   Subjective: Seen in room. No CP/dyspnea. S/p HD yesterday but thigh AVG re-thrombosed again at end of treatment - making this the 3rd time that has clotted within 4 day period, will plan to get vascular surgery involved. Pt questioning whether  BP dropping with HD is contributing - ?possibly although definitely not the main factor.  Objective Vitals:   06/18/18 2305 06/19/18 0025 06/19/18 0502 06/19/18 0503  BP: (!) 81/48 (!) 96/51  94/62  Pulse: (!) 103   80  Resp: 17   17  Temp: 98.4 F (36.9 C)   97.8 F (36.6 C)  TempSrc: Oral   Oral  SpO2: 97%   98%  Weight:   79.2 kg   Height:       Physical Exam General: Well appearing, NAD Heart: RRR; no murmur Lungs: CTAB Extremities: No LE edema Dialysis Access:  R thigh AVG without thrill/bruit  Additional Objective Labs: Basic Metabolic Panel: Recent Labs  Lab 06/15/18 0652 06/16/18 0926 06/17/18 1147 06/18/18 1438  NA 140 138 136 136  K 4.7 5.8* 4.7 4.7  CL 98 95* 94* 93*  CO2 18* 18* 21* 24  GLUCOSE 83 73 113* 139*  BUN 91* 105* 54* 66*  CREATININE 23.51* 25.42* 17.37* 20.29*  CALCIUM 8.0* 8.2* 8.5* 8.7*  PHOS 6.9*  --  6.7* 5.9*   Liver Function Tests: Recent Labs  Lab 06/15/18 0652 06/17/18 1147 06/18/18 1438  ALBUMIN 3.6 3.6 3.3*   CBC: Recent Labs  Lab 06/14/18 1944  06/16/18 1954 06/18/18 1032 06/18/18 1438  WBC 5.8  --  5.2 3.8* 3.8*  NEUTROABS 3.7  --   --   --   --   HGB 13.1   < > 12.0* 13.1 11.9*  HCT 41.2   < > 36.9* 40.7 37.1*  MCV 97.2  --  94.9 95.5 95.6  PLT 237  --  249 221 193   < > = values in this interval not displayed.   Studies/Results: Ir US Guide Vasc Access Right  Result Date: 06/17/2018 INDICATION: Clotted graft. End-stage renal disease, currently receiving dialysis via right thigh dialysis graft. Patient underwent technically successful 5 dialysis graft yesterday, 06/16/2018, and while receiving a complete dialysis session after  graft lysis, the patient states he experienced a transient episode of hypotension which has resulted in early graft rethrombosis. As such, patient presents today for repeat attempted graft thrombolysis. EXAM: 1. FISTULALYSIS 2. ANGIOPLASTY OF VENOUS LIMB AND VENOUS ANASTOMOSIS 3. ULTRASOUND GUIDANCE FOR VENOUS ACCESS COMPARISON:  Declot-06/16/2018 MEDICATIONS: Heparin 4000 units IV; TPA 4 mg into graft. CONTRAST:  50 cc Isovue-300 ANESTHESIA/SEDATION: Moderate (conscious) sedation was employed during this procedure. A total of Versed 5 mg and Fentanyl 200 mcg was administered intravenously. Moderate Sedation Time: 42 minutes. The patient's level of consciousness and vital signs were monitored continuously by radiology nursing throughout the procedure under my direct supervision. FLUOROSCOPY TIME:  4 minutes, 12 seconds (44 mGy) COMPLICATIONS: None immediate. TECHNIQUE: Informed written consent was obtained from the patient after a discussion of the risks, benefits and alternatives to treatment. Questions regarding the procedure were encouraged and answered. A timeout was performed prior to the initiation of the procedure. On physical examination, the existing right thigh dialysis graft was negative for palpable pulse or thrill. The skin overlying the graft was prepped and draped in the usual sterile fashion, and a sterile drape was applied covering the operative field. Maximum barrier sterile technique with sterile gowns and  gloves were used for the procedure. Under ultrasound guidance, the dialysis graft was accessed directed towards the venous anastomosis with a micropuncture kit after the overlying soft tissues were anesthetized with 1% lidocaine. An ultrasound image was saved for documentation purposes. The micropuncture sheath was exchange for a 7-French vascular sheath over a guidewire. Over a Benson wire, a Kumpe catheter was advanced centrally and a central venogram was performed. Pull back venogram was  performed with the Kumpe catheter. Heparin was administered systemically and TPA was administered via the Kumpe catheter throughout near the entirety of the venous limb. The venous anastomosis and majority of the venous limb was angioplastied with a 6 mm x 4 cm Conquest balloon. An additional access was obtained directed towards the arterial anastomoses with a micropuncture kit after the overlying soft tissues anesthetized with 1% lidocaine. This allowed for placement of a 6-French vascular sheath. The graft was thrombectomized with several rounds of push-pull mechanical thrombectomy with an occlusion balloon. Flow was restored to the graft as evidenced by restored pulsatility of the graft and brisk blood return from the side arm of the vascular sheath. Shuntograms were performed. Repeat angioplasty was performed with the 6 mm x 4 cm Conquest balloon at 2 sites of extravasation within the stick zone of the graft, one of which appear to communicate with a draining superficial vein. Completion images were obtained and the procedure was terminated. At this point, the procedure was terminated. All wires, catheters and sheaths were removed from the patient. Hemostasis was achieved at both access sites with deployment of a swizzle sutures which will be removed at the patient's next dialysis session. Dressings were placed. The patient tolerated the procedure without immediate postprocedural complication. FINDINGS: The existing right thigh AV graft is thrombosed. The venous anastomosis and near the entirety of the venous limb was angioplastied to 6 mm diameter. The graft was successfully thrombectomized using mechanical and pharmacologic means as above. Prolonged angioplasty was performed at 2 small areas of extravasation within the stick zone of the graft, 1 of which appear to communicate with a draining superficial vein. Completion shuntogram demonstrates the venous limb and anastomosis are widely patent with tiny areas  of extravasation at previous dialysis graft access sites which was controlled with additional manual compression. 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 The central venous system, arterial limb and arterial anastomosis are widely patent. IMPRESSION: 1. Technically successful right thigh AV graft thrombolysis. 2. Successful angioplasty of the venous anastomosis and majority of the venous limb to 6 mm diameter. Completion shuntogram demonstrates the venous limb and anastomosis are widely patent. 3. The arterial anastomosis and arterial limb are widely patent. 4. The central venous system is widely patent. ACCESS: Given early graft thrombosis (patient underwent technically successful graft declot procedure day prior on 06/16/2018), would recommend surgical revision if the graft were to rethrombosis within the next 4-6 weeks. Electronically Signed   By: [name] M.D.   On: 06/17/2018 18:40   Ir Thrombectomy Av Fistula/w Thrombolysis/pta Inc Shunt/img Right  Result Date: 06/17/2018 INDICATION: Clotted graft. End-stage renal disease, currently receiving dialysis via right thigh dialysis graft. Patient underwent technically successful 5 dialysis graft yesterday, 06/16/2018, and while receiving a complete dialysis session after graft lysis, the patient states he experienced a transient episode of hypotension which has resulted in early graft rethrombosis. As such, patient presents today for repeat attempted graft thrombolysis.  EXAM: 1. FISTULALYSIS 2. ANGIOPLASTY OF VENOUS LIMB AND VENOUS ANASTOMOSIS 3. ULTRASOUND GUIDANCE FOR VENOUS ACCESS COMPARISON:  Declot-06/16/2018 MEDICATIONS: Heparin 4000 units IV; TPA 4 mg into graft. CONTRAST:  50 cc Isovue-300 ANESTHESIA/SEDATION: Moderate (conscious) sedation was employed during this procedure. A total of Versed 5 mg and Fentanyl 200 mcg was administered intravenously.  Moderate Sedation Time: 42 minutes. The patient's level of consciousness and vital signs were monitored continuously by radiology nursing throughout the procedure under my direct supervision. FLUOROSCOPY TIME:  4 minutes, 12 seconds (44 mGy) COMPLICATIONS: None immediate. TECHNIQUE: Informed written consent was obtained from the patient after a discussion of the risks, benefits and alternatives to treatment. Questions regarding the procedure were encouraged and answered. A timeout was performed prior to the initiation of the procedure. On physical examination, the existing right thigh dialysis graft was negative for palpable pulse or thrill. The skin overlying the graft was prepped and draped in the usual sterile fashion, and a sterile drape was applied covering the operative field. Maximum barrier sterile technique with sterile gowns and gloves were used for the procedure. Under ultrasound guidance, the dialysis graft was accessed directed towards the venous anastomosis with a micropuncture kit after the overlying soft tissues were anesthetized with 1% lidocaine. An ultrasound image was saved for documentation purposes. The micropuncture sheath was exchange for a 7-French vascular sheath over a guidewire. Over a Benson wire, a Kumpe catheter was advanced centrally and a central venogram was performed. Pull back venogram was performed with the Kumpe catheter. Heparin was administered systemically and TPA was administered via the Kumpe catheter throughout near the entirety of the venous limb. The venous anastomosis and majority of the venous limb was angioplastied with a 6 mm x 4 cm Conquest balloon. An additional access was obtained directed towards the arterial anastomoses with a micropuncture kit after the overlying soft tissues anesthetized with 1% lidocaine. This allowed for placement of a 6-French vascular sheath. The graft was thrombectomized with several rounds of push-pull mechanical thrombectomy with an  occlusion balloon. Flow was restored to the graft as evidenced by restored pulsatility of the graft and brisk blood return from the side arm of the vascular sheath. Shuntograms were performed. Repeat angioplasty was performed with the 6 mm x 4 cm Conquest balloon at 2 sites of extravasation within the stick zone of the graft, one of which appear to communicate with a draining superficial vein. Completion images were obtained and the procedure was terminated. At this point, the procedure was terminated. All wires, catheters and sheaths were removed from the patient. Hemostasis was achieved at both access sites with deployment of a swizzle sutures which will be removed at the patient's next dialysis session. Dressings were placed. The patient tolerated the procedure without immediate postprocedural complication. FINDINGS: The existing right thigh AV graft is thrombosed. The venous anastomosis and near the entirety of the venous limb was angioplastied to 6 mm diameter. The graft was successfully thrombectomized using mechanical and pharmacologic means as above. Prolonged angioplasty was performed at 2 small areas of extravasation within the stick zone of the graft, 1 of which appear to communicate with a draining superficial vein. Completion shuntogram demonstrates the venous limb and anastomosis are widely patent with tiny areas of extravasation at previous dialysis graft access sites which was controlled with additional manual compression. 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 The central venous system,  arterial limb and arterial anastomosis are widely patent. IMPRESSION: 1. Technically successful right thigh AV graft thrombolysis. 2. Successful angioplasty of the venous anastomosis and majority of the venous limb to 6 mm diameter. Completion shuntogram demonstrates the venous limb and anastomosis are widely patent. 3.  The arterial anastomosis and arterial limb are widely patent. 4. The central venous system is widely patent. ACCESS: Given early graft thrombosis (patient underwent technically successful graft declot procedure day prior on 06/16/2018), would recommend surgical revision if the graft were to rethrombosis within the next 4-6 weeks. Electronically Signed   By: [name] M.D.   On: 06/17/2018 18:40   Medications: . sodium chloride     . calcium carbonate  8 tablet Oral TID WC  . Chlorhexidine Gluconate Cloth  6 each Topical Q0600  . heparin  5,000 Units Subcutaneous Q8H  . sodium chloride flush  3 mL Intravenous Q12H  . sodium chloride flush  3 mL Intravenous Q12H    Dialysis Orders: MWF at [facility] 3:45hr, 450/800, EDW 77.5kg, 2K/2.5Ca, P4/linear Na, AVG, heparin 1500  Assessment/Plan: 1. Recurrent thrombosis AV graft: S/p successful declot 9/14, then re-stenosed requiring repeat declot on 9/15. Now clotted again after HD 9/16. VVS consulted 9/17 for plan. 2. ESRD: Continue HD per MWF schedule, next HD due on 9/18. 3. Hypertension/volume: BP keeps dropping with HD, no edema. Likely being too aggressive with UF - will allow volume to come up slightly. 4. Anemia of CKD: Hgb 11.9. No indication for ESAat this time.  5. Hx parathyroidectomy: Ca in goal. Phos^, continue Ca carbonate binders.  6. Nutrition: Alb 3.3, adding pro-stat supplements. 7. Hep B +  Anthony Penton, PA-C 06/19/2018, 9:29 AM  [facility] Pager: [phone]  Pt seen, examined and agree w A/P as above.  Kelly Splinter MD [employer] pager [phone]   06/19/2018, 12:24 PM

## 2018-06-19 NOTE — Progress Notes (Addendum)
IR consulted for declotting procedure of right thigh AV graft.   Per Dr. Pascal Lux dictation from 9/15: Given early graft thrombosis (patient underwent technically successful graft declot procedure day prior on 06/16/2018), would recommend surgical revision if the graft were to rethrombosis within the next 4-6 weeks.  Patient would best be served by Vascular Surgery who has already seen the patient this morning and has plans for revision in the OR.  IR remains available if needed, however consult has been cancelled based on today's plans.  Please re-consult if needed.   Brynda Greathouse, MS RD PA-C 10:26 AM

## 2018-06-20 ENCOUNTER — Encounter (HOSPITAL_COMMUNITY): Payer: Self-pay | Admitting: Vascular Surgery

## 2018-06-20 LAB — CBC
HCT: 34.3 % — ABNORMAL LOW (ref 39.0–52.0)
Hemoglobin: 11.3 g/dL — ABNORMAL LOW (ref 13.0–17.0)
MCH: 31.7 pg (ref 26.0–34.0)
MCHC: 32.9 g/dL (ref 30.0–36.0)
MCV: 96.1 fL (ref 78.0–100.0)
PLATELETS: 175 10*3/uL (ref 150–400)
RBC: 3.57 MIL/uL — AB (ref 4.22–5.81)
RDW: 13.5 % (ref 11.5–15.5)
WBC: 9.3 10*3/uL (ref 4.0–10.5)

## 2018-06-20 LAB — RENAL FUNCTION PANEL
Albumin: 3.7 g/dL (ref 3.5–5.0)
Anion gap: 18 — ABNORMAL HIGH (ref 5–15)
BUN: 53 mg/dL — AB (ref 6–20)
CALCIUM: 7.8 mg/dL — AB (ref 8.9–10.3)
CO2: 20 mmol/L — AB (ref 22–32)
CREATININE: 15.93 mg/dL — AB (ref 0.61–1.24)
Chloride: 96 mmol/L — ABNORMAL LOW (ref 98–111)
GFR calc Af Amer: 4 mL/min — ABNORMAL LOW (ref >60)
GFR calc non Af Amer: 3 mL/min — ABNORMAL LOW (ref >60)
Glucose, Bld: 92 mg/dL (ref 70–99)
Phosphorus: 1.6 mg/dL — ABNORMAL LOW (ref 2.5–4.6)
Potassium: 5.7 mmol/L — ABNORMAL HIGH (ref 3.5–5.1)
SODIUM: 134 mmol/L — AB (ref 135–145)

## 2018-06-20 MED ORDER — MIDODRINE HCL 10 MG PO TABS: 10.0000 mg | ORAL_TABLET | Freq: Three times a day (TID) | ORAL | 0 refills | Status: DC

## 2018-06-20 MED ORDER — MIDODRINE HCL 5 MG PO TABS
ORAL_TABLET | ORAL | Status: AC
  Administered 2018-06-20: 10 mg via ORAL
  Filled 2018-06-20: qty 2

## 2018-06-20 MED ORDER — MIDODRINE HCL 5 MG PO TABS
10.0000 mg | ORAL_TABLET | Freq: Three times a day (TID) | ORAL | Status: DC
  Administered 2018-06-20: 10 mg via ORAL

## 2018-06-20 MED ORDER — HEPARIN SODIUM (PORCINE) 1000 UNIT/ML DIALYSIS
20.0000 [IU]/kg | INTRAMUSCULAR | Status: DC | PRN
  Administered 2018-06-20: 1600 [IU] via INTRAVENOUS_CENTRAL

## 2018-06-20 MED ORDER — OXYCODONE-ACETAMINOPHEN 5-325 MG PO TABS
ORAL_TABLET | ORAL | Status: AC
  Filled 2018-06-20: qty 1

## 2018-06-20 NOTE — Progress Notes (Addendum)
[facility] Progress Note   Subjective: Seen on HD this morning - 1L UF goal. S/p another declot of AVG yesterday afternoon with Dr. Scot Dock. Started on midodrine TID d/t recurrent hypotension which was felt to be large contributor of the recurrent clotting. Sys BP > 120 this morning and will try to avoid letting drop under SBP 100 at any time while on HD.  Objective Vitals:   06/20/18 0745 06/20/18 0806 06/20/18 0830 06/20/18 0900  BP: 122/78 128/80 132/77 122/70  Pulse: 75 75 78 83  Resp: 16     Temp: 98 F (36.7 C)     TempSrc: Oral     SpO2: 100%     Weight: 79.2 kg     Height:       Physical Exam General:Well appearing, NAD Heart:RRR; no murmur Lungs:CTAB Extremities:No LE edema Dialysis Access:R thigh AVG + bruit (cannulated)  Additional Objective Labs: Basic Metabolic Panel: Recent Labs  Lab 06/15/18 0652  06/17/18 1147 06/18/18 1438 06/19/18 1038  NA 140   < > 136 136 135  K 4.7   < > 4.7 4.7 5.2*  CL 98   < > 94* 93* 94*  CO2 18*   < > 21* 24 23  GLUCOSE 83   < > 113* 139* 91  BUN 91*   < > 54* 66* 35*  CREATININE 23.51*   < > 17.37* 20.29* 13.80*  CALCIUM 8.0*   < > 8.5* 8.7* 8.3*  PHOS 6.9*  --  6.7* 5.9*  --    < > = values in this interval not displayed.   Liver Function Tests: Recent Labs  Lab 06/15/18 0652 06/17/18 1147 06/18/18 1438  ALBUMIN 3.6 3.6 3.3*   CBC: Recent Labs  Lab 06/14/18 1944  06/16/18 1954 06/18/18 1032 06/18/18 1438 06/19/18 1038 06/20/18 0858  WBC 5.8  --  5.2 3.8* 3.8* 5.5 9.3  NEUTROABS 3.7  --   --   --   --   --   --   HGB 13.1   < > 12.0* 13.1 11.9* 12.8* 11.3*  HCT 41.2   < > 36.9* 40.7 37.1* 39.4 34.3*  MCV 97.2  --  94.9 95.5 95.6 95.6 96.1  PLT 237  --  249 221 193 203 175   < > = values in this interval not displayed.   BCBG: Recent Labs  Lab 06/17/18 0024 06/17/18 0142  GLUCAP 65* 158*   Medications: . sodium chloride    . sodium chloride     . calcium carbonate  8  tablet Oral TID WC  . Chlorhexidine Gluconate Cloth  6 each Topical Q0600  . heparin  5,000 Units Subcutaneous Q8H  . midodrine  10 mg Oral TID WC  . sodium chloride flush  3 mL Intravenous Q12H  . sodium chloride flush  3 mL Intravenous Q12H    Dialysis Orders: MWF at [facility] 3:45hr, 450/800, EDW 77.5kg, 2K/2.5Ca, P4/linear Na, AVG, heparin 1500  Assessment/Plan: 1. Recurrent thrombosis AV graft: S/p successful declot9/14, then re-stenosed requiring repeat declot on 9/15. Clotted again after HD 9/16, VVS consulted and able to be surgically declotted again 9/16. Plan is to avoid hypotension which may be contributing. Per VVS note, he really does not have any other site for new access. 2. ESRD: Continue HD per MWF schedule, HD today. 3. Hypertension/volume: BP keeps dropping with HD, no edema. Raising EDW and started on midodrine TID.  Will raise dry wt 2-2.5 kg now and can  raise further if needed in OP setting to try to avoid hypotension.   4. Anemia of CKD:Hgb 11.3. No indication for ESAat this time.  5. Hxparathyroidectomy:Ca in goal. Phos^, continue Ca carbonate binders.  6. Nutrition:Alb 3.3, encouraged diet. 7. Hep B + 8.    Dispo: If AVG remains open post HD today, then fine for discharge this afternoon.  Veneta Penton, PA-C 06/20/2018, 9:31 AM  [facility] Pager: [phone]  Pt seen, examined, agree w assess/plan as above with additions as indicated.  Kelly Splinter MD [employer] pager [phone]    cell [phone] 06/20/2018, 12:12 PM

## 2018-06-20 NOTE — Progress Notes (Signed)
Patient requested to speak to CSW. Patient stated that he is losing his housing and requested a shelter list. CSW provided homeless resources and inquired if patient had a ride out of the hospital. Patient stated that he had his car here. He also reported that financial counseling had been by to assess him for Medicaid.   CSW signing off.   Percell Locus Vineet Kinney LCSW [phone]

## 2018-06-20 NOTE — Anesthesia Postprocedure Evaluation (Signed)
Anesthesia Post Note  Patient: [name]  Procedure(s) Performed: THROMBECTOMY AND REVISION OF RIGHT THIGH  ARTERIOVENOUS GORE-TEX GRAFT (Right Thigh)     Patient location during evaluation: PACU Anesthesia Type: General Level of consciousness: awake and alert Pain management: pain level controlled Vital Signs Assessment: post-procedure vital signs reviewed and stable Respiratory status: spontaneous breathing, nonlabored ventilation, respiratory function stable and patient connected to nasal cannula oxygen Cardiovascular status: blood pressure returned to baseline and stable Postop Assessment: no apparent nausea or vomiting Anesthetic complications: no    Last Vitals:  Vitals:   06/20/18 1206 06/20/18 1342  BP: 127/71 118/81  Pulse: 78 91  Resp: 18 20  Temp: 37.1 C 37.2 C  SpO2: 100% 98%    Last Pain:  Vitals:   06/20/18 1342  TempSrc: Oral  PainSc:    Pain Goal: Patients Stated Pain Goal: 0 (06/17/18 1240)               Govanni Plemons L Crawford Tamura

## 2018-06-20 NOTE — Progress Notes (Signed)
   VASCULAR SURGERY ASSESSMENT & PLAN:   1 Day Post-Op s/p: Thrombectomy and revision of right thigh AV graft.  The graft has an excellent thrill.  I suspect that this is clotted multiple times in the last week because of hypotension.  This needs to be aggressively corrected given his risk for continued graft thrombosis secondary to hypotension.  Okay to discharge from vascular standpoint.  SUBJECTIVE:   No complaints this morning.  PHYSICAL EXAM:   Vitals:   06/19/18 1720 06/19/18 2039 06/20/18 0430 06/20/18 0450  BP: 117/79 92/61  107/73  Pulse: 79 74  77  Resp: 16 16  16   Temp: 97.8 F (36.6 C) 97.6 F (36.4 C)  97.6 F (36.4 C)  TempSrc: Oral Oral  Oral  SpO2: 100% 98%  100%  Weight:   80.4 kg   Height:       Excellent thrill in the right thigh AV graft. His incision looks fine.  LABS:   Lab Results  Component Value Date   WBC 5.5 06/19/2018   HGB 12.8 (L) 06/19/2018   HCT 39.4 06/19/2018   MCV 95.6 06/19/2018   PLT 203 06/19/2018   Lab Results  Component Value Date   CREATININE 13.80 (H) 06/19/2018   Lab Results  Component Value Date   INR 1.02 10/15/2016   CBG (last 3)  No results for input(s): GLUCAP in the last 72 hours.  PROBLEM LIST:    Principal Problem:   Problem with vascular access Active Problems:   ESRD (end stage renal disease) on dialysis ([place])   Hyperkalemia   Problem with dialysis access [facility])   CURRENT MEDS:   . calcium carbonate  8 tablet Oral TID WC  . Chlorhexidine Gluconate Cloth  6 each Topical Q0600  . heparin  5,000 Units Subcutaneous Q8H  . sodium chloride flush  3 mL Intravenous Q12H  . sodium chloride flush  3 mL Intravenous Q12H    [name] Beeper: [phone] Office: [phone] 06/20/2018

## 2018-07-03 ENCOUNTER — Inpatient Hospital Stay: Payer: Self-pay | Admitting: Family Medicine

## 2018-08-15 ENCOUNTER — Other Ambulatory Visit: Payer: Self-pay

## 2018-08-15 ENCOUNTER — Emergency Department (HOSPITAL_COMMUNITY)
Admission: EM | Admit: 2018-08-15 | Discharge: 2018-08-15 | Disposition: A | Discharge status: Home / Self Care | Payer: Self-pay | Attending: Emergency Medicine | Admitting: Emergency Medicine

## 2018-08-15 ENCOUNTER — Encounter (HOSPITAL_COMMUNITY): Payer: Self-pay | Admitting: Emergency Medicine

## 2018-08-15 DIAGNOSIS — Z87891 Personal history of nicotine dependence: Secondary | ICD-10-CM | POA: Insufficient documentation

## 2018-08-15 DIAGNOSIS — Y658 Other specified misadventures during surgical and medical care: Secondary | ICD-10-CM | POA: Insufficient documentation

## 2018-08-15 DIAGNOSIS — N186 End stage renal disease: Secondary | ICD-10-CM | POA: Insufficient documentation

## 2018-08-15 DIAGNOSIS — T82838A Hemorrhage of vascular prosthetic devices, implants and grafts, initial encounter: Secondary | ICD-10-CM | POA: Insufficient documentation

## 2018-08-15 DIAGNOSIS — Z79899 Other long term (current) drug therapy: Secondary | ICD-10-CM | POA: Insufficient documentation

## 2018-08-15 LAB — CBC WITH DIFFERENTIAL/PLATELET
Abs Immature Granulocytes: 0.01 10*3/uL (ref 0.00–0.07)
Basophils Absolute: 0.1 10*3/uL (ref 0.0–0.1)
Basophils Relative: 1 %
EOS PCT: 2 %
Eosinophils Absolute: 0.1 10*3/uL (ref 0.0–0.5)
HCT: 38.7 % — ABNORMAL LOW (ref 39.0–52.0)
HEMOGLOBIN: 11.1 g/dL — AB (ref 13.0–17.0)
Immature Granulocytes: 0 %
LYMPHS ABS: 1.2 10*3/uL (ref 0.7–4.0)
LYMPHS PCT: 28 %
MCH: 29.9 pg (ref 26.0–34.0)
MCHC: 28.7 g/dL — ABNORMAL LOW (ref 30.0–36.0)
MCV: 104.3 fL — AB (ref 80.0–100.0)
MONO ABS: 0.6 10*3/uL (ref 0.1–1.0)
Monocytes Relative: 14 %
Neutro Abs: 2.4 10*3/uL (ref 1.7–7.7)
Neutrophils Relative %: 55 %
Platelets: 213 10*3/uL (ref 150–400)
RBC: 3.71 MIL/uL — ABNORMAL LOW (ref 4.22–5.81)
RDW: 14 % (ref 11.5–15.5)
WBC: 4.4 10*3/uL (ref 4.0–10.5)
nRBC: 0 % (ref 0.0–0.2)

## 2018-08-15 LAB — BASIC METABOLIC PANEL
Anion gap: 15 (ref 5–15)
BUN: 48 mg/dL — AB (ref 6–20)
CHLORIDE: 100 mmol/L (ref 98–111)
CO2: 24 mmol/L (ref 22–32)
Calcium: 8.1 mg/dL — ABNORMAL LOW (ref 8.9–10.3)
Creatinine, Ser: 13.35 mg/dL — ABNORMAL HIGH (ref 0.61–1.24)
GFR calc Af Amer: 4 mL/min — ABNORMAL LOW (ref >60)
GFR calc non Af Amer: 4 mL/min — ABNORMAL LOW (ref >60)
GLUCOSE: 104 mg/dL — AB (ref 70–99)
POTASSIUM: 4.1 mmol/L (ref 3.5–5.1)
SODIUM: 139 mmol/L (ref 135–145)

## 2018-08-15 NOTE — Progress Notes (Signed)
Pt. From hemodaysis with right thigh graft needle accessed.  Needle de accessed and pressure held 10 -12 minutes until hemostasis obtain.  Dressing applied

## 2018-08-15 NOTE — ED Notes (Signed)
[employer] called

## 2018-08-15 NOTE — Progress Notes (Signed)
     Called to ED secondary to right thigh graft bleeding.  The right thigh was inspected.  The patient states it was the lateral stick site.  There is no active bleeding and no evidence of ulcer or hematoma.  There is an indwelling  IV on the medial graft side without bleeding.   Right thigh graft has palpable thrill without active bleeding.  ESRD Stable right thigh graft  Roxy Horseman PA-C

## 2018-08-15 NOTE — ED Provider Notes (Signed)
[facility] EMERGENCY DEPARTMENT Provider Note   CSN: [id number] Arrival date & time: 08/15/18  1009     History   Chief Complaint Chief Complaint  Patient presents with  . Bleeding Graft    HPI [name] is a 47 y.o. male.  Patient sent from dialysis center due to bleeding from his right thigh fistula site.  Hemostatic upon arrival.  Patient held pressure for about an hour.  Patient did not receive dialysis.  Has no complaints.  The history is provided by the patient.  Illness  This is a new problem. The current episode started 1 to 2 hours ago. The problem has been resolved. Pertinent negatives include no chest pain, no abdominal pain, no headaches and no shortness of breath. Nothing aggravates the symptoms. Nothing relieves the symptoms. He has tried nothing for the symptoms. The treatment provided no relief.    Past Medical History:  Diagnosis Date  . Anemia   . Anxiety   . Cough    DRY    . Depression   . ESRD on hemodialysis ([place])    HD [facility] MWF  . Headache(784.0)   . Hypertension   . Muscle spasms of neck    BACK, NECK  . Pneumonia 2015ish  . Thyroid disease     Patient Active Problem List   Diagnosis Date Noted  . Problem with dialysis access ([place]) 06/15/2018  . Hyperkalemia   . Problem with vascular access 10/07/2016  . 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 Complication from renal dialysis device   . ESRD (end stage renal disease) on dialysis ([place])   . Secondary hyperparathyroidism of renal origin ([place]) 04/24/2014  . Hyperparathyroidism, secondary ([place]) 03/21/2014    Past Surgical History:  Procedure Laterality Date  . ARTERIOVENOUS GRAFT PLACEMENT Left    "forearm, it's not working; thigh"   . ARTERIOVENOUS GRAFT PLACEMENT Left 11/09/2015  . AV FISTULA PLACEMENT Right 01/10/2017   Procedure: INSERTION OF ARTERIOVENOUS Right thigh GORE-TEX GRAFT;  Surgeon: [name], MD;   Location: [facility];  Service: Vascular;  Laterality: Right;  . FALSE ANEURYSM REPAIR Left 11/09/2015   Procedure: REPAIR OF LEFT FEMORAL PSEUDOANEURYSM; REVISION  OF LEFT THIGH ARTERIOVENOUS GRAFT USING 6MM X 10 CM GORETEX GRAFT ;  Surgeon: [name], MD;  Location: [facility];  Service: Vascular;  Laterality: Left;  . IR GENERIC HISTORICAL  07/15/2016   IR US GUIDE VASC ACCESS LEFT 07/15/2016 [name], MD [facility]  . IR GENERIC HISTORICAL Left 07/15/2016   IR THROMBECTOMY AV FISTULA W/THROMBOLYSIS/PTA INC/SHUNT/IMG LEFT 07/15/2016 [name], MD [facility]  . IR GENERIC HISTORICAL  10/05/2016   IR US GUIDE VASC ACCESS LEFT 10/05/2016 [name], MD [facility]  . IR GENERIC HISTORICAL Left 10/05/2016   IR THROMBECTOMY AV FISTULA W/THROMBOLYSIS/PTA INC/SHUNT/IMG LEFT 10/05/2016 [name], MD [facility]  . IR GENERIC HISTORICAL  10/08/2016   IR FLUORO GUIDE CV LINE RIGHT 10/08/2016 [name], MD [facility]  . IR GENERIC HISTORICAL  10/08/2016   IR US GUIDE VASC ACCESS RIGHT 10/08/2016 [name], MD [facility]  . IR REMOVAL TUN CV CATH W/O FL  03/16/2017  . IR THROMBECTOMY AV FISTULA W/THROMBOLYSIS/PTA INC/SHUNT/IMG RIGHT Right 06/16/2018  . IR THROMBECTOMY AV FISTULA W/THROMBOLYSIS/PTA INC/SHUNT/IMG RIGHT Right 06/17/2018  . IR US GUIDE VASC ACCESS RIGHT  06/17/2018  . IR US GUIDE VASC ACCESS RIGHT  06/16/2018  . PARATHYROIDECTOMY N/A 04/24/2014   Procedure: TOTAL  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;  Surgeon: [name], MD;  Location: [facility];  Service: General;  Laterality: N/A;  NECK AND LEFT FOREARM  . PERIPHERAL VASCULAR CATHETERIZATION N/A 05/05/2016   Procedure: A/V Shuntogram;  Surgeon: Conrad Carlisle, MD;  Location: [facility] CV LAB;  Service: Cardiovascular;  Laterality: N/A;  . PERIPHERAL VASCULAR CATHETERIZATION Left 05/05/2016   Procedure: Peripheral Vascular Balloon Angioplasty;  Surgeon: Conrad Lookingglass, MD;  Location: [facility] CV LAB;  Service:  Cardiovascular;  Laterality: Left;  . PSEUDOANEURYSM REPAIR Left 11/09/2015  . THROMBECTOMY / ARTERIOVENOUS GRAFT REVISION Left 08/18/2015   thigh  . THROMBECTOMY AND REVISION OF ARTERIOVENTOUS (AV) GORETEX  GRAFT Left 08/23/2015   Procedure: THROMBECTOMY AND REVISION OF ARTERIOVENTOUS (AV) GORETEX  GRAFT LEFT THIGH ;  Surgeon: [name], MD;  Location: [facility];  Service: Vascular;  Laterality: Left;  . THROMBECTOMY W/ EMBOLECTOMY Left 08/18/2015   Procedure: THROMBECTOMY  AND REVISION ARTERIOVENOUS GORE-TEX GRAFT/LEFT THIGH;  Surgeon: [name], MD;  Location: [facility];  Service: Vascular;  Laterality: Left;  . THROMBECTOMY W/ EMBOLECTOMY Right 06/19/2018   Procedure: THROMBECTOMY AND REVISION OF RIGHT THIGH  ARTERIOVENOUS GORE-TEX GRAFT;  Surgeon: [name], MD;  Location: [facility];  Service: Vascular;  Laterality: Right;  . UPPER EXTREMITY VENOGRAPHY Bilateral 12/27/2016   Procedure: Upper Extremity Venography;  Surgeon: [name], MD;  Location: [facility] CV LAB;  Service: Cardiovascular;  Laterality: Bilateral;  . VENOGRAM Left 05/05/2016   Procedure: Venogram;  Surgeon: Conrad Babbie, MD;  Location: [facility] CV LAB;  Service: Cardiovascular;  Laterality: Left;  lower extremity        Home Medications    Prior to Admission medications   Medication Sig Start Date End Date Taking? Authorizing Provider  acetaminophen (TYLENOL) 500 MG tablet Take 1,000 mg by mouth daily as needed (for pain or headaches).     [provider]  benzonatate (TESSALON) 100 MG capsule Take 1 capsule (100 mg total) by mouth every 8 (eight) hours. Patient not taking: Reported on 06/15/2018 07/25/17   Larene Pickett, PA-C  calcium elemental as carbonate (TUMS ULTRA 1000) 400 MG chewable tablet Chew 2,000-4,000 mg by mouth See admin instructions. Chew 4 tablets (4,000 mg) by mouth 3 times daily with meals and 2 tablets (2,000 mg) with snacks    [provider]    fluticasone (FLONASE) 50 MCG/ACT nasal spray Place 2 sprays into both nostrils daily. Patient not taking: Reported on 06/15/2018 07/25/17   Larene Pickett, PA-C  midodrine (PROAMATINE) 10 MG tablet Take 1 tablet (10 mg total) by mouth 3 (three) times daily with meals. 06/20/18   Thurnell Lose, MD  oxyCODONE-acetaminophen (PERCOCET/ROXICET) 5-325 MG tablet Take 1 tablet by mouth every 4 (four) hours as needed for moderate pain. Patient not taking: Reported on 06/15/2018 01/11/17   Ulyses Amor, PA-C    Family History Family History  Problem Relation Age of Onset  . Renal Disease Neg Hx     Social History Social History   Tobacco Use  . Smoking status: Former Smoker    Last attempt to quit: 03/21/1986    Years since quitting: 32.4  . Smokeless tobacco: Never Used  Substance Use Topics  . Alcohol use: No    Alcohol/week: 0.0 standard drinks  . Drug use: No     Allergies   Lisinopril; Ivp dye [iodinated diagnostic agents]; and Solu-medrol [methylprednisolone]   Review of Systems Review  of Systems  Constitutional: Negative for chills and fever.  HENT: Negative for ear pain and sore throat.   Eyes: Negative for pain and visual disturbance.  Respiratory: Negative for cough and shortness of breath.   Cardiovascular: Negative for chest pain and palpitations.  Gastrointestinal: Negative for abdominal pain and vomiting.  Genitourinary: Negative for dysuria and hematuria.  Musculoskeletal: Negative for arthralgias and back pain.  Skin: Negative for color change and rash.  Neurological: Negative for seizures, syncope and headaches.  All other systems reviewed and are negative.    Physical Exam Updated Vital Signs  ED Triage Vitals  Enc Vitals Group     BP 08/15/18 1013 (!) 156/105     Pulse Rate 08/15/18 1013 68     Resp 08/15/18 1013 16     Temp 08/15/18 1013 97.9 F (36.6 C)     Temp Source 08/15/18 1013 Oral     SpO2 08/15/18 1013 96 %     Weight 08/15/18 1012  179 lb 7.3 oz (81.4 kg)     Height --      Head Circumference --      Peak Flow --      Pain Score 08/15/18 1011 0     Pain Loc --      Pain Edu? --      Excl. in [place]? --      Physical Exam  Constitutional: He appears well-developed and well-nourished.  HENT:  Head: Normocephalic and atraumatic.  Eyes: Pupils are equal, round, and reactive to light. Conjunctivae and EOM are normal.  Neck: Normal range of motion. Neck supple.  Cardiovascular: Normal rate, regular rhythm, normal heart sounds and intact distal pulses.  No murmur heard. Pulmonary/Chest: Effort normal and breath sounds normal. No respiratory distress.  Abdominal: Soft. There is no tenderness.  Musculoskeletal: He exhibits no edema.  Neurological: He is alert.  Skin: Skin is warm and dry.  Right thigh fistula site with no active bleeding, palpable thrill  Psychiatric: He has a normal mood and affect.  Nursing note and vitals reviewed.    ED Treatments / Results  Labs (all labs ordered are listed, but only abnormal results are displayed) Labs Reviewed  CBC WITH DIFFERENTIAL/PLATELET - Abnormal; Notable for the following components:      Result Value   RBC 3.71 (*)    Hemoglobin 11.1 (*)    HCT 38.7 (*)    MCV 104.3 (*)    MCHC 28.7 (*)    All other components within normal limits  BASIC METABOLIC PANEL - Abnormal; Notable for the following components:   Glucose, Bld 104 (*)    BUN 48 (*)    Creatinine, Ser 13.35 (*)    Calcium 8.1 (*)    GFR calc non Af Amer 4 (*)    GFR calc Af Amer 4 (*)    All other components within normal limits    EKG EKG Interpretation  Date/Time:  Wednesday August 15 2018 10:19:32 EST Ventricular Rate:  67 PR Interval:    QRS Duration: 85 QT Interval:  434 QTC Calculation: 459 R Axis:   23 Text Interpretation:  Sinus rhythm Baseline wander in lead(s) II V1 Confirmed by Lennice Sites [id number]) on 08/15/2018 10:40:54 AM   Radiology No results  found.  Procedures Procedures (including critical care time)  Medications Ordered in ED Medications - No data to display   Initial Impression / Assessment and Plan / ED Course  I have reviewed the triage vital signs  and the nursing notes.  Pertinent labs & imaging results that were available during my care of the patient were reviewed by me and considered in my medical decision making (see chart for details).     [name] is a 47 year old male with history of end-stage renal disease on dialysis who presents to the ED with bleeding from his fistula.  Patient with normal vitals.  No fever.  Patient had bleeding from his fistula site shortly after access via dialysis today.  He has held pressure for the past hour.  Fistula is no longer bleeding upon my evaluation.  Has good strong palpable thrill.  Has recent issues with his fistula but overall is well-appearing.  CBC showed no significant anemia.  Electrolytes including potassium within normal limits.  Contacted nephrology and they will schedule patient for make-up dialysis.  At this time he can follow-up tomorrow for make up dialysis. Vascular evaluated fistula site and confirm no issues. No signs of volume overload on exam.  Patient made aware of this plan and was discharged from ED in good condition.  This chart was dictated using voice recognition software.  Despite best efforts to proofread,  errors can occur which can change the documentation meaning.  Final Clinical Impressions(s) / ED Diagnoses   Final diagnoses:  Right thigh hemodialysis AV fistula bleed [facility])    ED Discharge Orders    None       Lennice Sites, DO 08/15/18 1420

## 2018-08-15 NOTE — ED Notes (Signed)
This RN called by lab and informed that bmet is hemolyzed, needs recollect. [place] Rn notified as well as provider.

## 2018-08-15 NOTE — ED Notes (Signed)
IV team at bedside. De-accesssed graft, advised to re-check it in 15-20 minutes for bleeding.

## 2018-08-15 NOTE — Discharge Instructions (Addendum)
Follow-up for dialysis tomorrow afternoon.  Call them tomorrow morning to confirm appointment.

## 2018-08-15 NOTE — ED Triage Notes (Signed)
Pt from [facility] dialysis center with right thigh bleeding access. Pt did not receive any treatment. Denies Pain. A/O X4 Swarm in progress.

## 2018-10-15 ENCOUNTER — Other Ambulatory Visit: Payer: Self-pay

## 2018-10-15 ENCOUNTER — Encounter (HOSPITAL_COMMUNITY): Payer: Self-pay | Admitting: Emergency Medicine

## 2018-10-15 ENCOUNTER — Emergency Department (HOSPITAL_COMMUNITY)
Admission: EM | Admit: 2018-10-15 | Discharge: 2018-10-16 | Disposition: A | Discharge status: Home / Self Care | Payer: Self-pay | Attending: Emergency Medicine | Admitting: Emergency Medicine

## 2018-10-15 DIAGNOSIS — Z87891 Personal history of nicotine dependence: Secondary | ICD-10-CM | POA: Insufficient documentation

## 2018-10-15 DIAGNOSIS — N186 End stage renal disease: Secondary | ICD-10-CM | POA: Insufficient documentation

## 2018-10-15 DIAGNOSIS — B349 Viral infection, unspecified: Secondary | ICD-10-CM

## 2018-10-15 DIAGNOSIS — Z992 Dependence on renal dialysis: Secondary | ICD-10-CM | POA: Insufficient documentation

## 2018-10-15 DIAGNOSIS — I120 Hypertensive chronic kidney disease with stage 5 chronic kidney disease or end stage renal disease: Secondary | ICD-10-CM | POA: Insufficient documentation

## 2018-10-15 LAB — CBC
HCT: 34.5 % — ABNORMAL LOW (ref 39.0–52.0)
Hemoglobin: 10.6 g/dL — ABNORMAL LOW (ref 13.0–17.0)
MCH: 28.6 pg (ref 26.0–34.0)
MCHC: 30.7 g/dL (ref 30.0–36.0)
MCV: 93 fL (ref 80.0–100.0)
Platelets: 279 10*3/uL (ref 150–400)
RBC: 3.71 MIL/uL — ABNORMAL LOW (ref 4.22–5.81)
RDW: 14.2 % (ref 11.5–15.5)
WBC: 5.4 10*3/uL (ref 4.0–10.5)
nRBC: 0 % (ref 0.0–0.2)

## 2018-10-15 NOTE — ED Notes (Signed)
Pt st's he has had a low grade fever (99.9) with a slight headache every afternoon x's 2 weeks.  Pt st's he has been taking Tylenol and Cold and Flu medication for same.  Pt denies any nausea or vomiting.    Pt is a dialysis pt and goes to dialysis Mon, Wed and Fri.  Pt was last dialyzed today

## 2018-10-15 NOTE — ED Triage Notes (Signed)
Pt reports he has had "afternoon fever for 2 weeks". Pt afebrile in triage. Pt also reports headache x 2 weeks. Pt denies sick contact. Pt denies N/V. Pt reports some sensitivity to light and sound. Pt denies hx of headache.

## 2018-10-15 NOTE — ED Provider Notes (Signed)
[facility] EMERGENCY DEPARTMENT Provider Note   CSN: [id number] Arrival date & time: 10/15/18  1738     History   Chief Complaint Chief Complaint  Patient presents with  . Fever  . Headache    HPI [name] is a 48 y.o. male.  48 y/o male with hx of HTN and ESRD on MWF dialysis (last dialyzed today for full session) presents to the ED for evaluation of subjective fever.  Highest documented temperature has been 99.9 F.  Reports that he has been experiencing subjective fever mostly in the afternoon and evenings x2 weeks.  Reports taking Cold and flu medication for symptoms without relief.  Has been experiencing some cough as well as body aches.  Notes a mild, sporadic left temporal headache which waxes and wanes in severity.  He has been taking Tylenol for this with some improvement.  Denies any sick contacts or sick individuals at his dialysis center.  He denies chest pain, shortness of breath, nausea, vomiting, abdominal pain, sore throat, vision changes, vision loss.  The history is provided by the patient. No language interpreter was used.  Fever  Associated symptoms: headaches   Headache  Associated symptoms: fever     Past Medical History:  Diagnosis Date  . Anemia   . Anxiety   . Cough    DRY    . Depression   . ESRD on hemodialysis ([place])    HD [facility] MWF  . Headache(784.0)   . Hypertension   . Muscle spasms of neck    BACK, NECK  . Pneumonia 2015ish  . Thyroid disease     Patient Active Problem List   Diagnosis Date Noted  . Problem with dialysis access ([place]) 06/15/2018  . Hyperkalemia   . Problem with vascular access 10/07/2016  . Pseudoaneurysm of arteriovenous graft ([place]) 11/06/2015  . Complication from renal dialysis device   . ESRD (end stage renal disease) on dialysis ([place])   . Secondary hyperparathyroidism of renal origin ([place]) 04/24/2014  . Hyperparathyroidism, secondary ([place]) 03/21/2014    Past Surgical  History:  Procedure Laterality Date  . ARTERIOVENOUS GRAFT PLACEMENT Left    "forearm, it's not working; thigh"   . ARTERIOVENOUS GRAFT PLACEMENT Left 11/09/2015  . AV FISTULA PLACEMENT Right 01/10/2017   Procedure: INSERTION OF ARTERIOVENOUS Right thigh GORE-TEX GRAFT;  Surgeon: [name], MD;  Location: [facility];  Service: Vascular;  Laterality: Right;  . FALSE ANEURYSM REPAIR Left 11/09/2015   Procedure: REPAIR OF LEFT FEMORAL PSEUDOANEURYSM; REVISION  OF LEFT THIGH ARTERIOVENOUS GRAFT USING 6MM X 10 CM GORETEX GRAFT ;  Surgeon: [name], MD;  Location: [facility];  Service: Vascular;  Laterality: Left;  . IR GENERIC HISTORICAL  07/15/2016   IR US GUIDE VASC ACCESS LEFT 07/15/2016 [name], MD [facility]  . IR GENERIC HISTORICAL Left 07/15/2016   IR THROMBECTOMY AV FISTULA W/THROMBOLYSIS/PTA INC/SHUNT/IMG LEFT 07/15/2016 [name], MD [facility]  . IR GENERIC HISTORICAL  10/05/2016   IR US GUIDE VASC ACCESS LEFT 10/05/2016 [name], MD [facility]  . IR GENERIC HISTORICAL Left 10/05/2016   IR THROMBECTOMY AV FISTULA W/THROMBOLYSIS/PTA INC/SHUNT/IMG LEFT 10/05/2016 [name], MD [facility]  . IR GENERIC HISTORICAL  10/08/2016   IR FLUORO GUIDE CV LINE RIGHT 10/08/2016 [name], MD [facility]  . IR GENERIC HISTORICAL  10/08/2016   IR US GUIDE VASC ACCESS RIGHT 10/08/2016 [name], MD [facility]  . IR REMOVAL TUN CV  CATH W/O FL  03/16/2017  . IR THROMBECTOMY AV FISTULA W/THROMBOLYSIS/PTA INC/SHUNT/IMG RIGHT Right 06/16/2018  . IR THROMBECTOMY AV FISTULA W/THROMBOLYSIS/PTA INC/SHUNT/IMG RIGHT Right 06/17/2018  . IR US GUIDE VASC ACCESS RIGHT  06/17/2018  . IR US GUIDE VASC ACCESS RIGHT  06/16/2018  . PARATHYROIDECTOMY N/A 04/24/2014   Procedure: TOTAL 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;  Surgeon: [name], MD;  Location: [facility];  Service: General;  Laterality: N/A;  NECK AND LEFT FOREARM  . PERIPHERAL VASCULAR CATHETERIZATION N/A 05/05/2016   Procedure:  A/V Shuntogram;  Surgeon: Conrad Bunkie, MD;  Location: [facility];  Service: Cardiovascular;  Laterality: N/A;  . PERIPHERAL VASCULAR CATHETERIZATION Left 05/05/2016   Procedure: Peripheral Vascular Balloon Angioplasty;  Surgeon: Conrad Wanette, MD;  Location: [facility];  Service: Cardiovascular;  Laterality: Left;  . PSEUDOANEURYSM REPAIR Left 11/09/2015  . THROMBECTOMY / ARTERIOVENOUS GRAFT REVISION Left 08/18/2015   thigh  . THROMBECTOMY AND REVISION OF ARTERIOVENTOUS (AV) GORETEX  GRAFT Left 08/23/2015   Procedure: THROMBECTOMY AND REVISION OF ARTERIOVENTOUS (AV) GORETEX  GRAFT LEFT THIGH ;  Surgeon: [name], MD;  Location: [facility];  Service: Vascular;  Laterality: Left;  . THROMBECTOMY W/ EMBOLECTOMY Left 08/18/2015   Procedure: THROMBECTOMY  AND REVISION ARTERIOVENOUS GORE-TEX GRAFT/LEFT THIGH;  Surgeon: [name], MD;  Location: [facility];  Service: Vascular;  Laterality: Left;  . THROMBECTOMY W/ EMBOLECTOMY Right 06/19/2018   Procedure: THROMBECTOMY AND REVISION OF RIGHT THIGH  ARTERIOVENOUS GORE-TEX GRAFT;  Surgeon: [name], MD;  Location: [facility];  Service: Vascular;  Laterality: Right;  . UPPER EXTREMITY VENOGRAPHY Bilateral 12/27/2016   Procedure: Upper Extremity Venography;  Surgeon: [name], MD;  Location: [facility];  Service: Cardiovascular;  Laterality: Bilateral;  . VENOGRAM Left 05/05/2016   Procedure: Venogram;  Surgeon: Conrad Piltzville, MD;  Location: [facility];  Service: Cardiovascular;  Laterality: Left;  lower extremity        Home Medications    Prior to Admission medications   Medication Sig Start Date End Date Taking? Authorizing Provider  acetaminophen (TYLENOL) 500 MG tablet Take 1,000 mg by mouth daily as needed (for pain or headaches).    Yes [provider]  benzonatate (TESSALON) 100 MG capsule Take 1 capsule (100 mg total) by mouth every 8 (eight) hours. Patient not taking: Reported on  06/15/2018 07/25/17   Larene Pickett, PA-C  fluticasone [facility]) 50 MCG/ACT nasal spray Place 2 sprays into both nostrils daily. Patient not taking: Reported on 06/15/2018 07/25/17   Larene Pickett, PA-C  midodrine (PROAMATINE) 10 MG tablet Take 1 tablet (10 mg total) by mouth 3 (three) times daily with meals. Patient not taking: Reported on 10/16/2018 06/20/18   Thurnell Lose, MD  oxyCODONE-acetaminophen (PERCOCET/ROXICET) 5-325 MG tablet Take 1 tablet by mouth every 4 (four) hours as needed for moderate pain. Patient not taking: Reported on 06/15/2018 01/11/17   Ulyses Amor, PA-C    Family History Family History  Problem Relation Age of Onset  . Renal Disease Neg Hx     Social History Social History   Tobacco Use  . Smoking status: Former Smoker    Last attempt to quit: 03/21/1986    Years since quitting: 32.5  . Smokeless tobacco: Never Used  Substance Use Topics  . Alcohol use: No    Alcohol/week: 0.0 standard drinks  . Drug use: No     Allergies  Lisinopril; Ivp dye [iodinated diagnostic agents]; and Solu-medrol [methylprednisolone]   Review of Systems Review of Systems  Constitutional: Positive for fever.  Neurological: Positive for headaches.  Ten systems reviewed and are negative for acute change, except as noted in the HPI.    Physical Exam Updated Vital Signs BP (!) 156/92   Pulse 75   Temp 99.4 F (37.4 C) (Oral)   Resp 20   SpO2 97%   Physical Exam Vitals signs and nursing note reviewed.  Constitutional:      General: He is not in acute distress.    Appearance: He is well-developed. He is not diaphoretic.     Comments: Nontoxic appearing and in NAD  HENT:     Head: Normocephalic and atraumatic.  Eyes:     General: No scleral icterus.    Conjunctiva/sclera: Conjunctivae normal.  Neck:     Musculoskeletal: Normal range of motion.  Cardiovascular:     Rate and Rhythm: Normal rate and regular rhythm.     Heart sounds: Normal heart sounds.    Pulmonary:     Effort: Pulmonary effort is normal. No respiratory distress.     Breath sounds: No wheezing, rhonchi or rales.     Comments: Respirations even and unlabored Abdominal:     Comments: Soft, nontender abdomen.  Musculoskeletal: Normal range of motion.  Skin:    General: Skin is warm and dry.     Coloration: Skin is not pale.     Findings: No erythema or rash.  Neurological:     Mental Status: He is alert and oriented to person, place, and time.     GCS: GCS eye subscore is 4. GCS verbal subscore is 5. GCS motor subscore is 6.     Cranial Nerves: No cranial nerve deficit.     Comments: GCS 15. Speech is clear, goal oriented. Answers questions appropriately and follows commands. Moves all extremities spontaneously. No focal deficits noted.  Psychiatric:        Behavior: Behavior normal.      ED Treatments / Results  Labs (all labs ordered are listed, but only abnormal results are displayed) Labs Reviewed  COMPREHENSIVE METABOLIC PANEL - Abnormal; Notable for the following components:      Result Value   Sodium 134 (*)    Chloride 94 (*)    BUN 30 (*)    Creatinine, Ser 10.84 (*)    Calcium 8.7 (*)    Total Protein 8.3 (*)    GFR calc non Af Amer 5 (*)    GFR calc Af Amer 6 (*)    All other components within normal limits  CBC - Abnormal; Notable for the following components:   RBC 3.71 (*)    Hemoglobin 10.6 (*)    HCT 34.5 (*)    All other components within normal limits    EKG None  Radiology No results found.  Procedures Procedures (including critical care time)  Medications Ordered in ED Medications - No data to display   Initial Impression / Assessment and Plan / ED Course  I have reviewed the triage vital signs and the nursing notes.  Pertinent labs & imaging results that were available during my care of the patient were reviewed by me and considered in my medical decision making (see chart for details).     48 year old male with a  history of hypertension and hemodialysis presents to the ED for evaluation of nonspecific subjective fever over the past 2 weeks.  He has  been afebrile in the ED with stable vital signs.  The patient does not meet criteria for SIRS or Sepsis.  He has had some mild body aches and a sporadic L temporal headache, but no other specific symptoms.  Laboratory evaluation is reassuring.  His lungs are clear to auscultation bilaterally.  No tachypnea, dyspnea, hypoxia to suggest pneumonia.  No fever, nuchal rigidity, meningismus to suggest meningitis.  Neurologic exam today is nonfocal.    Question viral illness.  The patient has been encouraged to follow-up with his primary care doctor.  He will continue with over-the-counter medications for symptom control.  Return precautions discussed and provided.  Patient discharged in stable condition with no unaddressed concerns.   Final Clinical Impressions(s) / ED Diagnoses   Final diagnoses:  Viral illness    ED Discharge Orders    None       Antonietta Breach, PA-C 10/16/18 [place], [place], DO 10/16/18 1523

## 2018-10-16 LAB — COMPREHENSIVE METABOLIC PANEL
ALT: 13 U/L (ref 0–44)
AST: 25 U/L (ref 15–41)
Albumin: 3.5 g/dL (ref 3.5–5.0)
Alkaline Phosphatase: 53 U/L (ref 38–126)
Anion gap: 13 (ref 5–15)
BUN: 30 mg/dL — ABNORMAL HIGH (ref 6–20)
CO2: 27 mmol/L (ref 22–32)
Calcium: 8.7 mg/dL — ABNORMAL LOW (ref 8.9–10.3)
Chloride: 94 mmol/L — ABNORMAL LOW (ref 98–111)
Creatinine, Ser: 10.84 mg/dL — ABNORMAL HIGH (ref 0.61–1.24)
GFR calc Af Amer: 6 mL/min — ABNORMAL LOW (ref >60)
GFR, EST NON AFRICAN AMERICAN: 5 mL/min — AB (ref >60)
Glucose, Bld: 93 mg/dL (ref 70–99)
Potassium: 4.4 mmol/L (ref 3.5–5.1)
Sodium: 134 mmol/L — ABNORMAL LOW (ref 135–145)
Total Bilirubin: 0.6 mg/dL (ref 0.3–1.2)
Total Protein: 8.3 g/dL — ABNORMAL HIGH (ref 6.5–8.1)

## 2018-10-16 NOTE — Discharge Instructions (Addendum)
Your blood tests today are reassuring and did not reveal a concerning cause of your symptoms.  We recommend that you continue Tylenol as needed for headache and body aches.  Drink plenty of water to prevent dehydration.  Get plenty of rest and follow-up with your primary care doctor.  You may return to the ED for new or concerning symptoms.

## 2019-01-30 ENCOUNTER — Other Ambulatory Visit (HOSPITAL_COMMUNITY): Payer: Self-pay | Admitting: Nephrology

## 2019-01-30 ENCOUNTER — Telehealth (HOSPITAL_COMMUNITY): Payer: Self-pay

## 2019-01-30 DIAGNOSIS — Z992 Dependence on renal dialysis: Principal | ICD-10-CM

## 2019-01-30 DIAGNOSIS — N186 End stage renal disease: Secondary | ICD-10-CM

## 2019-01-30 NOTE — Telephone Encounter (Signed)
Called pt to schedule fistulagram, no answer, no vm. I also tried sister with no answer. AW

## 2019-01-31 ENCOUNTER — Ambulatory Visit (HOSPITAL_COMMUNITY): Payer: Self-pay

## 2019-02-01 ENCOUNTER — Telehealth: Payer: Self-pay | Admitting: Radiology

## 2019-02-01 NOTE — Progress Notes (Signed)
  Also tried number in chart listed for Sister Mariejeanne  Again-- "not able to be completed at this time"

## 2019-02-01 NOTE — Progress Notes (Signed)
   Pt is scheduled for fistulogram 02/05/19 at 1100 am  He is allergic to contrast Called pre medication into [employer] [phone] Pred 50 mg #3 Benadryl 50 mg #1 With instructions  Called pt number in chart "Call could not be completed at this time" [phone]  Will continue to try to reach pt

## 2019-02-04 ENCOUNTER — Other Ambulatory Visit: Payer: Self-pay | Admitting: Student

## 2019-02-04 ENCOUNTER — Other Ambulatory Visit: Payer: Self-pay | Admitting: Radiology

## 2019-02-05 ENCOUNTER — Other Ambulatory Visit: Payer: Self-pay | Admitting: Radiology

## 2019-02-05 ENCOUNTER — Telehealth: Payer: Self-pay | Admitting: Radiology

## 2019-02-05 ENCOUNTER — Ambulatory Visit (HOSPITAL_COMMUNITY): Admission: RE | Admit: 2019-02-05 | Payer: Self-pay | Source: Ambulatory Visit

## 2019-02-05 ENCOUNTER — Other Ambulatory Visit: Payer: Self-pay | Admitting: Student

## 2019-02-05 ENCOUNTER — Other Ambulatory Visit: Payer: Self-pay

## 2019-02-05 ENCOUNTER — Ambulatory Visit (HOSPITAL_COMMUNITY)
Admission: RE | Admit: 2019-02-05 | Discharge: 2019-02-05 | Disposition: A | Discharge status: Home / Self Care | Payer: Self-pay | Source: Ambulatory Visit | Attending: Nephrology | Admitting: Nephrology

## 2019-02-05 DIAGNOSIS — Z538 Procedure and treatment not carried out for other reasons: Secondary | ICD-10-CM | POA: Insufficient documentation

## 2019-02-05 DIAGNOSIS — N186 End stage renal disease: Secondary | ICD-10-CM | POA: Insufficient documentation

## 2019-02-05 DIAGNOSIS — Z992 Dependence on renal dialysis: Secondary | ICD-10-CM | POA: Insufficient documentation

## 2019-02-05 MED ORDER — SODIUM CHLORIDE 0.9 % IV SOLN: INTRAVENOUS | Status: DC

## 2019-02-05 NOTE — Progress Notes (Signed)
Patient ID: [name], male   DOB: 1971/08/14, 48 y.o.   MRN: [id number]   I called in Pre meds on Fri May 1st Attempted to call pt many times as documented in my note I also called sister-- unable to get through--- again as documented in my note.  Pt now here today Did not get meds from Pharmacy  Rescheduled to tomorrow at 8 am  Meds: Pred 50 mg 7pm tonight; 1 am 5/6; and 7 am 5/6            Benadryl 50 mg 7 am 5/6  Pt was given new instructions this am He leaves with good understanding He must go get meds from Pharmacy today  Phone number has been updated in chart

## 2019-02-05 NOTE — Progress Notes (Signed)
  Spoke to pt at length regarding procedure of dialysis graft evaluation  He is well aware of procedure--- he has had several thrombectomy procedures in IR Most recent 06/17/18  Pt states his thigh graft is not clotted this time Was just used Mon and completed dialysis although slow Has noted swelling in right leg  Was referred to IR for evaluation and possible intervention  O: + thrill and pulse in right thigh dialysis graft  Pt allergic to contrast Did not take pre meds-- did not get message about meds called in May 1 to pharmacy (see my documentation note)  Now rescheduled to 5/6 8 am Will take pre meds tonight  Pt has NO driver-- has no one he can call  We can use Pepcid 20 mg instead of Benadryl  But  If needs sedation for any reason- this will be problematic Pt is aware of the issues here. He will try to find a driver to have with him tomorrow He is also going to call dialysis center to see if dialysis could be arranged at [facility] tomorrow.

## 2019-02-05 NOTE — Progress Notes (Signed)
Pt given instructions on when to arrive at the hospital and where to report to (0630 to Radiology) Times to takes pre meds (Pred at 1900. 0100, and 0700 also  Benadryl at 0700) Also informed NPO after Midnight  informed he will need a driver. Pt states he has no one to drive him. Jannifer Franklin, PA called and informed. States to have him come and she may be able to change meds so he can drive himself. Pt informed.Voices understanding.

## 2019-02-05 NOTE — Progress Notes (Addendum)
  Pt did not take his pre med states he didn't get the med, " no one called me" Jannifer Franklin, PA  called and informed Telephone number updated [phone] Pt states this is the best number to reach him.

## 2019-02-06 ENCOUNTER — Other Ambulatory Visit (HOSPITAL_COMMUNITY): Payer: Self-pay | Admitting: Nephrology

## 2019-02-06 ENCOUNTER — Encounter (HOSPITAL_COMMUNITY): Payer: Self-pay

## 2019-02-06 ENCOUNTER — Ambulatory Visit (HOSPITAL_COMMUNITY)
Admission: RE | Admit: 2019-02-06 | Discharge: 2019-02-06 | Disposition: A | Discharge status: Home / Self Care | Payer: Self-pay | Source: Ambulatory Visit | Attending: Nephrology | Admitting: Nephrology

## 2019-02-06 ENCOUNTER — Other Ambulatory Visit: Payer: Self-pay

## 2019-02-06 DIAGNOSIS — N186 End stage renal disease: Secondary | ICD-10-CM | POA: Insufficient documentation

## 2019-02-06 DIAGNOSIS — T82858A Stenosis of vascular prosthetic devices, implants and grafts, initial encounter: Secondary | ICD-10-CM | POA: Insufficient documentation

## 2019-02-06 DIAGNOSIS — Z992 Dependence on renal dialysis: Secondary | ICD-10-CM | POA: Insufficient documentation

## 2019-02-06 DIAGNOSIS — Y832 Surgical operation with anastomosis, bypass or graft as the cause of abnormal reaction of the patient, or of later complication, without mention of misadventure at the time of the procedure: Secondary | ICD-10-CM | POA: Insufficient documentation

## 2019-02-06 HISTORY — PX: IR US GUIDE VASC ACCESS RIGHT: IMG2390

## 2019-02-06 HISTORY — PX: IR AV DIALY SHUNT INTRO NEEDLE/INTRACATH INITIAL W/PTA/IMG RIGHT: IMG6116

## 2019-02-06 LAB — BASIC METABOLIC PANEL
Anion gap: 19 — ABNORMAL HIGH (ref 5–15)
BUN: 54 mg/dL — ABNORMAL HIGH (ref 6–20)
CO2: 24 mmol/L (ref 22–32)
Calcium: 8 mg/dL — ABNORMAL LOW (ref 8.9–10.3)
Chloride: 96 mmol/L — ABNORMAL LOW (ref 98–111)
Creatinine, Ser: 15.71 mg/dL — ABNORMAL HIGH (ref 0.61–1.24)
GFR calc Af Amer: 4 mL/min — ABNORMAL LOW (ref >60)
GFR calc non Af Amer: 3 mL/min — ABNORMAL LOW (ref >60)
Glucose, Bld: 134 mg/dL — ABNORMAL HIGH (ref 70–99)
Potassium: 4.6 mmol/L (ref 3.5–5.1)
Sodium: 139 mmol/L (ref 135–145)

## 2019-02-06 LAB — CBC
HCT: 39.1 % (ref 39.0–52.0)
Hemoglobin: 13.2 g/dL (ref 13.0–17.0)
MCH: 30.9 pg (ref 26.0–34.0)
MCHC: 33.8 g/dL (ref 30.0–36.0)
MCV: 91.6 fL (ref 80.0–100.0)
Platelets: 279 10*3/uL (ref 150–400)
RBC: 4.27 MIL/uL (ref 4.22–5.81)
RDW: 15.5 % (ref 11.5–15.5)
WBC: 5.1 10*3/uL (ref 4.0–10.5)
nRBC: 0 % (ref 0.0–0.2)

## 2019-02-06 LAB — PROTIME-INR
INR: 1 (ref 0.8–1.2)
Prothrombin Time: 13 seconds (ref 11.4–15.2)

## 2019-02-06 MED ORDER — IOHEXOL 300 MG/ML  SOLN
100.0000 mL | Freq: Once | INTRAMUSCULAR | Status: AC | PRN
  Administered 2019-02-06: 50 mL via INTRAVENOUS

## 2019-02-06 MED ORDER — LIDOCAINE HCL (PF) 1 % IJ SOLN
INTRAMUSCULAR | Status: DC | PRN
  Administered 2019-02-06: 5 mL

## 2019-02-06 MED ORDER — LIDOCAINE HCL 1 % IJ SOLN
INTRAMUSCULAR | Status: AC
  Filled 2019-02-06: qty 20

## 2019-02-06 MED ORDER — FAMOTIDINE 20 MG PO TABS: 20.0000 mg | ORAL_TABLET | Freq: Once | ORAL | Status: DC

## 2019-02-06 MED ORDER — SODIUM CHLORIDE 0.9 % IV SOLN: INTRAVENOUS | Status: DC

## 2019-02-06 NOTE — Procedures (Signed)
ESRD  S/P RT FEM AVG SHUNTOGRAM WITH 14MM EXT ILIAC VEIN PTA  No comp Stable ebl min Full report in pacs

## 2019-02-13 ENCOUNTER — Encounter (HOSPITAL_COMMUNITY): Payer: Self-pay | Admitting: Interventional Radiology

## 2019-05-03 ENCOUNTER — Other Ambulatory Visit (HOSPITAL_COMMUNITY): Payer: Self-pay | Admitting: Nephrology

## 2019-05-03 DIAGNOSIS — N186 End stage renal disease: Secondary | ICD-10-CM

## 2019-05-08 ENCOUNTER — Ambulatory Visit (HOSPITAL_COMMUNITY): Admission: RE | Admit: 2019-05-08 | Payer: Self-pay | Source: Ambulatory Visit

## 2019-05-09 ENCOUNTER — Ambulatory Visit (HOSPITAL_COMMUNITY): Admission: RE | Admit: 2019-05-09 | Payer: Self-pay | Source: Ambulatory Visit

## 2019-05-13 ENCOUNTER — Other Ambulatory Visit: Payer: Self-pay | Admitting: Radiology

## 2019-05-14 ENCOUNTER — Other Ambulatory Visit (HOSPITAL_COMMUNITY): Payer: Self-pay | Admitting: Nephrology

## 2019-05-14 ENCOUNTER — Ambulatory Visit (HOSPITAL_COMMUNITY)
Admission: RE | Admit: 2019-05-14 | Discharge: 2019-05-14 | Disposition: A | Discharge status: Home / Self Care | Payer: Self-pay | Source: Ambulatory Visit | Attending: Nephrology | Admitting: Nephrology

## 2019-05-14 ENCOUNTER — Encounter (HOSPITAL_COMMUNITY): Payer: Self-pay

## 2019-05-14 ENCOUNTER — Other Ambulatory Visit: Payer: Self-pay

## 2019-05-14 DIAGNOSIS — F419 Anxiety disorder, unspecified: Secondary | ICD-10-CM | POA: Insufficient documentation

## 2019-05-14 DIAGNOSIS — N186 End stage renal disease: Secondary | ICD-10-CM

## 2019-05-14 DIAGNOSIS — I120 Hypertensive chronic kidney disease with stage 5 chronic kidney disease or end stage renal disease: Secondary | ICD-10-CM | POA: Insufficient documentation

## 2019-05-14 DIAGNOSIS — Z992 Dependence on renal dialysis: Secondary | ICD-10-CM | POA: Insufficient documentation

## 2019-05-14 DIAGNOSIS — Y841 Kidney dialysis as the cause of abnormal reaction of the patient, or of later complication, without mention of misadventure at the time of the procedure: Secondary | ICD-10-CM | POA: Insufficient documentation

## 2019-05-14 DIAGNOSIS — M7989 Other specified soft tissue disorders: Secondary | ICD-10-CM | POA: Insufficient documentation

## 2019-05-14 DIAGNOSIS — T82858A Stenosis of vascular prosthetic devices, implants and grafts, initial encounter: Secondary | ICD-10-CM | POA: Insufficient documentation

## 2019-05-14 DIAGNOSIS — E079 Disorder of thyroid, unspecified: Secondary | ICD-10-CM | POA: Insufficient documentation

## 2019-05-14 DIAGNOSIS — Z87891 Personal history of nicotine dependence: Secondary | ICD-10-CM | POA: Insufficient documentation

## 2019-05-14 HISTORY — PX: IR AV DIALY SHUNT INTRO NEEDLE/INTRACATH INITIAL W/PTA/IMG RIGHT: IMG6116

## 2019-05-14 HISTORY — PX: IR US GUIDE VASC ACCESS RIGHT: IMG2390

## 2019-05-14 LAB — BASIC METABOLIC PANEL
Anion gap: 17 — ABNORMAL HIGH (ref 5–15)
BUN: 30 mg/dL — ABNORMAL HIGH (ref 6–20)
CO2: 29 mmol/L (ref 22–32)
Calcium: 9.1 mg/dL (ref 8.9–10.3)
Chloride: 93 mmol/L — ABNORMAL LOW (ref 98–111)
Creatinine, Ser: 11.83 mg/dL — ABNORMAL HIGH (ref 0.61–1.24)
GFR calc Af Amer: 5 mL/min — ABNORMAL LOW (ref >60)
GFR calc non Af Amer: 5 mL/min — ABNORMAL LOW (ref >60)
Glucose, Bld: 136 mg/dL — ABNORMAL HIGH (ref 70–99)
Potassium: 4.4 mmol/L (ref 3.5–5.1)
Sodium: 139 mmol/L (ref 135–145)

## 2019-05-14 LAB — CBC
HCT: 45.9 % (ref 39.0–52.0)
Hemoglobin: 14.6 g/dL (ref 13.0–17.0)
MCH: 30.5 pg (ref 26.0–34.0)
MCHC: 31.8 g/dL (ref 30.0–36.0)
MCV: 96 fL (ref 80.0–100.0)
Platelets: 234 10*3/uL (ref 150–400)
RBC: 4.78 MIL/uL (ref 4.22–5.81)
RDW: 14.6 % (ref 11.5–15.5)
WBC: 5.5 10*3/uL (ref 4.0–10.5)
nRBC: 0 % (ref 0.0–0.2)

## 2019-05-14 MED ORDER — ACETAMINOPHEN 325 MG PO TABS
325.0000 mg | ORAL_TABLET | Freq: Once | ORAL | Status: AC
  Administered 2019-05-14: 325 mg via ORAL

## 2019-05-14 MED ORDER — FENTANYL CITRATE (PF) 100 MCG/2ML IJ SOLN
INTRAMUSCULAR | Status: AC
  Filled 2019-05-14: qty 2

## 2019-05-14 MED ORDER — SODIUM CHLORIDE 0.9 % IV SOLN: INTRAVENOUS | Status: DC

## 2019-05-14 MED ORDER — IOHEXOL 300 MG/ML  SOLN
100.0000 mL | Freq: Once | INTRAMUSCULAR | Status: AC | PRN
  Administered 2019-05-14: 30 mL via INTRAVENOUS

## 2019-05-14 MED ORDER — IOHEXOL 300 MG/ML  SOLN: 100.0000 mL | Freq: Once | INTRAMUSCULAR | Status: DC | PRN

## 2019-05-14 MED ORDER — HYDROCODONE-ACETAMINOPHEN 5-325 MG PO TABS: 1.0000 | ORAL_TABLET | ORAL | Status: DC | PRN

## 2019-05-14 MED ORDER — HEPARIN SODIUM (PORCINE) 1000 UNIT/ML IJ SOLN
INTRAMUSCULAR | Status: AC | PRN
  Administered 2019-05-14: 3000 [IU] via INTRAVENOUS

## 2019-05-14 MED ORDER — MIDAZOLAM HCL 2 MG/2ML IJ SOLN
INTRAMUSCULAR | Status: AC
  Filled 2019-05-14: qty 2

## 2019-05-14 MED ORDER — ACETAMINOPHEN 325 MG PO TABS
ORAL_TABLET | ORAL | Status: AC
  Filled 2019-05-14: qty 1

## 2019-05-14 MED ORDER — LIDOCAINE HCL 1 % IJ SOLN
INTRAMUSCULAR | Status: AC
  Filled 2019-05-14: qty 20

## 2019-05-14 MED ORDER — ALTEPLASE 2 MG IJ SOLR
INTRAMUSCULAR | Status: AC
  Filled 2019-05-14: qty 2

## 2019-05-14 MED ORDER — MIDAZOLAM HCL 2 MG/2ML IJ SOLN
INTRAMUSCULAR | Status: AC | PRN
  Administered 2019-05-14: 2 mg via INTRAVENOUS
  Administered 2019-05-14: 0.5 mg via INTRAVENOUS

## 2019-05-14 MED ORDER — HEPARIN SODIUM (PORCINE) 1000 UNIT/ML IJ SOLN
INTRAMUSCULAR | Status: AC
  Filled 2019-05-14: qty 1

## 2019-05-14 MED ORDER — DIPHENHYDRAMINE HCL 50 MG PO CAPS
50.0000 mg | ORAL_CAPSULE | Freq: Once | ORAL | Status: AC
  Administered 2019-05-14: 50 mg via ORAL
  Filled 2019-05-14: qty 1

## 2019-05-14 MED ORDER — FENTANYL CITRATE (PF) 100 MCG/2ML IJ SOLN
INTRAMUSCULAR | Status: AC | PRN
  Administered 2019-05-14: 12.5 ug via INTRAVENOUS
  Administered 2019-05-14: 50 ug via INTRAVENOUS

## 2019-05-14 NOTE — Discharge Instructions (Signed)
Moderate Conscious Sedation, Adult, Care After These instructions provide you with information about caring for yourself after your procedure. Your health care provider may also give you more specific instructions. Your treatment has been planned according to current medical practices, but problems sometimes occur. Call your health care provider if you have any problems or questions after your procedure. What can I expect after the procedure? After your procedure, it is common:  To feel sleepy for several hours.  To feel clumsy and have poor balance for several hours.  To have poor judgment for several hours.  To vomit if you eat too soon. Follow these instructions at home: For at least 24 hours after the procedure:   Do not: ? Participate in activities where you could fall or become injured. ? Drive. ? Use heavy machinery. ? Drink alcohol. ? Take sleeping pills or medicines that cause drowsiness. ? Make important decisions or sign legal documents. ? Take care of children on your own.  Rest. Eating and drinking  Follow the diet recommended by your health care provider.  If you vomit: ? Drink water, juice, or soup when you can drink without vomiting. ? Make sure you have little or no nausea before eating solid foods. General instructions  Have a responsible adult stay with you until you are awake and alert.  Take over-the-counter and prescription medicines only as told by your health care provider.  If you smoke, do not smoke without supervision.  Keep all follow-up visits as told by your health care provider. This is important. Contact a health care provider if:  You keep feeling nauseous or you keep vomiting.  You feel light-headed.  You develop a rash.  You have a fever. Get help right away if:  You have trouble breathing. This information is not intended to replace advice given to you by your health care provider. Make sure you discuss any questions you have  with your health care provider. Document Released: 07/10/2013 Document Revised: 09/01/2017 Document Reviewed: 01/09/2016 Elsevier Patient Education  2020 [employer].  Dialysis Shuntogram, Care After This sheet gives you information about how to care for yourself after your procedure. Your health care provider may also give you more specific instructions. If you have problems or questions, contact your health care provider. What can I expect after the procedure? After the procedure, it is common to have:  A small amount of discomfort in the area where the small, thin tube (catheter) was placed for the procedure.  A small amount of bruising around the fistula.  Sleepiness and tiredness (fatigue). Follow these instructions at home: Activity   Rest at home and do not lift anything that is heavier than 5 lb (2.3 kg) on the day after your procedure.  Return to your normal activities as told by your health care provider. Ask your health care provider what activities are safe for you.  Do not drive or use heavy machinery while taking prescription pain medicine.  Do not drive for 24 hours if you were given a medicine to help you relax (sedative) during your procedure. Medicines   Take over-the-counter and prescription medicines only as told by your health care provider. Puncture site care  Follow instructions from your health care provider about how to take care of the site where catheters were inserted. Make sure you: ? Wash your hands with soap and water before you change your bandage (dressing). If soap and water are not available, use hand sanitizer. ? Change your dressing as  told by your health care provider. ? Leave stitches (sutures), skin glue, or adhesive strips in place. These skin closures may need to stay in place for 2 weeks or longer. If adhesive strip edges start to loosen and curl up, you may trim the loose edges. Do not remove adhesive strips completely unless your health  care provider tells you to do that.  Check your puncture area every day for signs of infection. Check for: ? Redness, swelling, or pain. ? Fluid or blood. ? Warmth. ? Pus or a bad smell. General instructions  Do not take baths, swim, or use a hot tub until your health care provider approves. Ask your health care provider if you may take showers. You may only be allowed to take sponge baths.  Monitor your dialysis fistula closely. Check to make sure that you can feel a vibration or buzz (a thrill) when you put your fingers over the fistula.  Prevent damage to your graft or fistula: ? Do not wear tight-fitting clothing or jewelry on the arm or leg that has your graft or fistula. ? Tell all your health care providers that you have a dialysis fistula or graft. ? Do not allow blood draws, IVs, or blood pressure readings to be done in the arm that has your fistula or graft. ? Do not allow flu shots or vaccinations in the arm with your fistula or graft.  Keep all follow-up visits as told by your health care provider. This is important. Contact a health care provider if:  You have redness, swelling, or pain at the site where the catheter was put in.  You have fluid or blood coming from the catheter site.  The catheter site feels warm to the touch.  You have pus or a bad smell coming from the catheter site.  You have a fever or chills. Get help right away if:  You feel weak.  You have trouble balancing.  You have trouble moving your arms or legs.  You have problems with your speech or vision.  You can no longer feel a vibration or buzz when you put your fingers over your dialysis fistula.  The limb that was used for the procedure: ? Swells. ? Is painful. ? Is cold. ? Is discolored, such as blue or pale white.  You have chest pain or shortness of breath. Summary  After a dialysis fistulogram, it is common to have a small amount of discomfort or bruising in the area where the  small, thin tube (catheter) was placed.  Rest at home on the day after your procedure. Return to your normal activities as told by your health care provider.  Take over-the-counter and prescription medicines only as told by your health care provider.  Follow instructions from your health care provider about how to take care of the site where the catheter was inserted.  Keep all follow-up visits as told by your health care provider. This information is not intended to replace advice given to you by your health care provider. Make sure you discuss any questions you have with your health care provider. Document Released: 02/03/2014 Document Revised: 10/20/2017 Document Reviewed: 10/20/2017 Elsevier Patient Education  2020 [employer].

## 2019-05-14 NOTE — H&P (Signed)
Chief Complaint: Patient was seen in consultation today for right thigh graft evaluation/possible intervention at the request of [place]  Referring Physician(s): [place]  Supervising Physician: Jacqulynn Cadet  Patient Status: [facility] - Out-pt  History of Present Illness: [name] is a 48 y.o. male   ESRD Right thigh graft Well known to IR Last intervention: 02/06/19: Successful 14 mm external iliac venous angioplasty of tandem outflow venous stenoses as detailed above. Right femoral AV graft remains patent  Last dialysis yesterday: complete Slow flow and leg swelling noted now for approx 1 mo  Request for evaluation and possible intervention  Past Medical History:  Diagnosis Date  . Anemia   . Anxiety   . Cough    DRY    . Depression   . ESRD on hemodialysis ([place])    HD [facility] MWF  . Headache(784.0)   . Hypertension   . Muscle spasms of neck    BACK, NECK  . Pneumonia 2015ish  . Thyroid disease     Past Surgical History:  Procedure Laterality Date  . ARTERIOVENOUS GRAFT PLACEMENT Left    "forearm, it's not working; thigh"   . ARTERIOVENOUS GRAFT PLACEMENT Left 11/09/2015  . AV FISTULA PLACEMENT Right 01/10/2017   Procedure: INSERTION OF ARTERIOVENOUS Right thigh GORE-TEX GRAFT;  Surgeon: [name], MD;  Location: [place];  Service: Vascular;  Laterality: Right;  . FALSE ANEURYSM REPAIR Left 11/09/2015   Procedure: REPAIR OF LEFT FEMORAL PSEUDOANEURYSM; REVISION  OF LEFT THIGH ARTERIOVENOUS GRAFT USING 6MM X 10 CM GORETEX GRAFT ;  Surgeon: [name], MD;  Location: [facility] OR;  Service: Vascular;  Laterality: Left;  . IR AV DIALY SHUNT INTRO NEEDLE/INTRACATH INITIAL W/PTA/IMG RIGHT Right 02/06/2019  . IR GENERIC HISTORICAL  07/15/2016   IR US GUIDE VASC ACCESS LEFT 07/15/2016 [name], MD [facility]-INTER[facility] RAD  . IR GENERIC HISTORICAL Left 07/15/2016   IR THROMBECTOMY AV FISTULA W/THROMBOLYSIS/PTA INC/SHUNT/IMG LEFT 07/15/2016 [name], MD [facility]  . IR GENERIC HISTORICAL  10/05/2016   IR US GUIDE VASC ACCESS LEFT 10/05/2016 [name], MD [facility]  . IR GENERIC HISTORICAL Left 10/05/2016   IR THROMBECTOMY AV FISTULA W/THROMBOLYSIS/PTA INC/SHUNT/IMG LEFT 10/05/2016 [name], MD [facility]  . IR GENERIC HISTORICAL  10/08/2016   IR FLUORO GUIDE CV LINE RIGHT 10/08/2016 [name], MD [facility]  . IR GENERIC HISTORICAL  10/08/2016   IR US GUIDE VASC ACCESS RIGHT 10/08/2016 [name], MD [facility]  . IR REMOVAL TUN CV CATH W/O FL  03/16/2017  . IR THROMBECTOMY AV FISTULA W/THROMBOLYSIS/PTA INC/SHUNT/IMG RIGHT Right 06/16/2018  . IR THROMBECTOMY AV FISTULA W/THROMBOLYSIS/PTA INC/SHUNT/IMG RIGHT Right 06/17/2018  . IR US GUIDE VASC ACCESS RIGHT  06/17/2018  . IR US GUIDE VASC ACCESS RIGHT  06/16/2018  . IR US GUIDE VASC ACCESS RIGHT  02/06/2019  . 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 PERIPHERAL VASCULAR CATHETERIZATION N/A 05/05/2016   Procedure: A/V Shuntogram;  Surgeon: Conrad [place], MD;  Location: [place] CV LAB;  Service: Cardiovascular;  Laterality: N/A;  . PERIPHERAL VASCULAR CATHETERIZATION Left 05/05/2016   Procedure: Peripheral Vascular Balloon Angioplasty;  Surgeon: Conrad Antwerp, MD;  Location: [place] CV LAB;  Service: Cardiovascular;  Laterality: Left;  . PSEUDOANEURYSM REPAIR Left 11/09/2015  . THROMBECTOMY / ARTERIOVENOUS GRAFT  REVISION Left 08/18/2015   thigh  . THROMBECTOMY AND REVISION OF ARTERIOVENTOUS (AV) GORETEX  GRAFT Left 08/23/2015   Procedure: THROMBECTOMY AND REVISION OF ARTERIOVENTOUS (AV) GORETEX  GRAFT LEFT THIGH ;  Surgeon: [name], MD;  Location: [place];  Service: Vascular;  Laterality: Left;  . THROMBECTOMY W/ EMBOLECTOMY Left 08/18/2015   Procedure: THROMBECTOMY  AND REVISION  ARTERIOVENOUS GORE-TEX GRAFT/LEFT THIGH;  Surgeon: [name], MD;  Location: [place];  Service: Vascular;  Laterality: Left;  . THROMBECTOMY W/ EMBOLECTOMY Right 06/19/2018   Procedure: THROMBECTOMY AND REVISION OF RIGHT THIGH  ARTERIOVENOUS GORE-TEX GRAFT;  Surgeon: [name], MD;  Location: [place];  Service: Vascular;  Laterality: Right;  . UPPER EXTREMITY VENOGRAPHY Bilateral 12/27/2016   Procedure: Upper Extremity Venography;  Surgeon: [name], MD;  Location: [facility] CV LAB;  Service: Cardiovascular;  Laterality: Bilateral;  . VENOGRAM Left 05/05/2016   Procedure: Venogram;  Surgeon: Conrad Redstone, MD;  Location: [place] CV LAB;  Service: Cardiovascular;  Laterality: Left;  lower extremity    Allergies: Lisinopril, Ivp dye [iodinated diagnostic agents], and Solu-medrol [methylprednisolone]  Medications: Prior to Admission medications   Medication Sig Start Date End Date Taking? Authorizing Provider  acetaminophen (TYLENOL) 500 MG tablet Take 1,000 mg by mouth daily as needed (for pain or headaches).     [provider]  benzonatate (TESSALON) 100 MG capsule Take 1 capsule (100 mg total) by mouth every 8 (eight) hours. Patient not taking: Reported on 06/15/2018 07/25/17   Larene Pickett, PA-C  fluticasone [facility]) 50 MCG/ACT nasal spray Place 2 sprays into both nostrils daily. Patient not taking: Reported on 06/15/2018 07/25/17   Larene Pickett, PA-C  midodrine (PROAMATINE) 10 MG tablet Take 1 tablet (10 mg total) by mouth 3 (three) times daily with meals. Patient not taking: Reported on 10/16/2018 06/20/18   Thurnell Lose, MD  oxyCODONE-acetaminophen (PERCOCET/ROXICET) 5-325 MG tablet Take 1 tablet by mouth every 4 (four) hours as needed for moderate pain. Patient not taking: Reported on 06/15/2018 01/11/17   Ulyses Amor, PA-C     Family History  Problem Relation Age of Onset  . Renal Disease Neg Hx     Social History   Socioeconomic  History  . Marital status: Married    Spouse name: Not on file  . Number of children: Not on file  . Years of education: Not on file  . Highest education level: Not on file  Occupational History  . Not on file  Social Needs  . Financial resource strain: Not on file  . Food insecurity    Worry: Not on file    Inability: Not on file  . Transportation needs    Medical: Not on file    Non-medical: Not on file  Tobacco Use  . Smoking status: Former Smoker    Quit date: 03/21/1986    Years since quitting: 33.1  . Smokeless tobacco: Never Used  Substance and Sexual Activity  . Alcohol use: No    Alcohol/week: 0.0 standard drinks  . Drug use: No  . Sexual activity: Not on file  Lifestyle  . Physical activity    Days per week: Not on file    Minutes per session: Not on file  . Stress: Not on file  Relationships  . Social Herbalist on phone: Not on file    Gets together: Not on file    Attends religious service: Not on  file    Active member of club or organization: Not on file    Attends meetings of clubs or organizations: Not on file    Relationship status: Not on file  Other Topics Concern  . Not on file  Social History Narrative  . Not on file    Review of Systems: A 12 point ROS discussed and pertinent positives are indicated in the HPI above.  All other systems are negative.  Review of Systems  Constitutional: Negative for activity change, fatigue and fever.  Respiratory: Negative for cough and shortness of breath.   Gastrointestinal: Negative for abdominal pain.  Neurological: Negative for weakness.  Psychiatric/Behavioral: Negative for behavioral problems and confusion.    Vital Signs: There were no vitals taken for this visit.  Physical Exam Vitals signs reviewed.  Cardiovascular:     Rate and Rhythm: Normal rate and regular rhythm.     Heart sounds: Normal heart sounds.  Pulmonary:     Effort: Pulmonary effort is normal.     Breath sounds:  Normal breath sounds.  Abdominal:     Palpations: Abdomen is soft.  Musculoskeletal: Normal range of motion.     Comments: Right thigh graft +pulse; + thrill  Skin:    General: Skin is warm and dry.  Neurological:     Mental Status: He is alert and oriented to person, place, and time.  Psychiatric:        Mood and Affect: Mood normal.        Behavior: Behavior normal.        Thought Content: Thought content normal.        Judgment: Judgment normal.     Imaging: No results found.  Labs:  CBC: Recent Labs    06/20/18 0858 08/15/18 1036 10/15/18 2304 02/06/19 0700  WBC 9.3 4.4 5.4 5.1  HGB 11.3* 11.1* 10.6* 13.2  HCT 34.3* 38.7* 34.5* 39.1  PLT 175 213 279 279    COAGS: Recent Labs    02/06/19 0700  INR 1.0    BMP: Recent Labs    06/20/18 0857 08/15/18 1211 10/15/18 2304 02/06/19 0700  NA 134* 139 134* 139  K 5.7* 4.1 4.4 4.6  CL 96* 100 94* 96*  CO2 20* 24 27 24   GLUCOSE 92 104* 93 134*  BUN 53* 48* 30* 54*  CALCIUM 7.8* 8.1* 8.7* 8.0*  CREATININE 15.93* 13.35* 10.84* 15.71*  GFRNONAA 3* 4* 5* 3*  GFRAA 4* 4* 6* 4*    LIVER FUNCTION TESTS: Recent Labs    06/17/18 1147 06/18/18 1438 06/20/18 0857 10/15/18 2304  BILITOT  --   --   --  0.6  AST  --   --   --  25  ALT  --   --   --  13  ALKPHOS  --   --   --  53  PROT  --   --   --  8.3*  ALBUMIN 3.6 3.3* 3.7 3.5    TUMOR MARKERS: No results for input(s): AFPTM, CEA, CA199, CHROMGRNA in the last 8760 hours.  Assessment and Plan:  ESRD Rt thigh graft slow flow and rt leg swelling Now for evaluation and possible intervention--- possible tunneled dialysis catheter placement if needed Pt is aware of procedure benefits and risks - including but not limited to: infection; bleeding; vessel damage; damage to surrounding structures Pt is agreeable\consent signed in chart  Thank you for this interesting consult.  I greatly enjoyed meeting [name] and look  forward to participating  in their care.  A copy of this report was sent to the requesting provider on this date.  Electronically Signed: Lavonia Drafts, PA-C 05/14/2019, 9:43 AM   I spent a total of    25 Minutes in face to face in clinical consultation, greater than 50% of which was counseling/coordinating care for right thigh graft evaluation and possible intervention

## 2019-05-14 NOTE — Procedures (Signed)
Interventional Radiology Procedure Note  Procedure: Shuntogram with DCB of new central right external iliac vein stenosis to 12 mm and POBA to 14 mm.   Complications: None  Estimated Blood Loss: None  Recommendations: - Bedrest x 2 hrs - DC home  Signed,  Criselda Peaches, MD

## 2019-05-15 ENCOUNTER — Other Ambulatory Visit (HOSPITAL_COMMUNITY): Payer: Self-pay | Admitting: Nephrology

## 2019-05-15 DIAGNOSIS — Z992 Dependence on renal dialysis: Secondary | ICD-10-CM

## 2019-05-15 DIAGNOSIS — N186 End stage renal disease: Secondary | ICD-10-CM

## 2019-05-16 ENCOUNTER — Other Ambulatory Visit (HOSPITAL_COMMUNITY): Payer: Self-pay

## 2019-06-20 ENCOUNTER — Other Ambulatory Visit: Payer: Self-pay | Admitting: Nephrology

## 2019-06-20 ENCOUNTER — Other Ambulatory Visit (HOSPITAL_COMMUNITY): Payer: Self-pay | Admitting: Nephrology

## 2019-06-20 DIAGNOSIS — R221 Localized swelling, mass and lump, neck: Secondary | ICD-10-CM

## 2019-06-28 ENCOUNTER — Ambulatory Visit (HOSPITAL_COMMUNITY): Payer: Self-pay

## 2019-07-02 ENCOUNTER — Ambulatory Visit (HOSPITAL_COMMUNITY): Payer: Self-pay

## 2019-07-04 ENCOUNTER — Ambulatory Visit (HOSPITAL_COMMUNITY): Payer: Self-pay

## 2019-07-09 ENCOUNTER — Other Ambulatory Visit (HOSPITAL_COMMUNITY): Payer: Self-pay | Admitting: Nephrology

## 2019-07-09 ENCOUNTER — Other Ambulatory Visit: Payer: Self-pay

## 2019-07-09 ENCOUNTER — Ambulatory Visit (HOSPITAL_COMMUNITY)
Admission: RE | Admit: 2019-07-09 | Discharge: 2019-07-09 | Disposition: A | Discharge status: Home / Self Care | Payer: Self-pay | Source: Ambulatory Visit | Attending: Nephrology | Admitting: Nephrology

## 2019-07-09 DIAGNOSIS — R221 Localized swelling, mass and lump, neck: Secondary | ICD-10-CM | POA: Insufficient documentation

## 2019-08-08 ENCOUNTER — Other Ambulatory Visit (HOSPITAL_COMMUNITY): Payer: Self-pay | Admitting: Nephrology

## 2019-08-08 DIAGNOSIS — N186 End stage renal disease: Secondary | ICD-10-CM

## 2019-08-08 DIAGNOSIS — Z992 Dependence on renal dialysis: Secondary | ICD-10-CM

## 2019-08-12 ENCOUNTER — Other Ambulatory Visit: Payer: Self-pay | Admitting: Radiology

## 2019-08-13 ENCOUNTER — Ambulatory Visit (HOSPITAL_COMMUNITY)
Admission: RE | Admit: 2019-08-13 | Discharge: 2019-08-13 | Disposition: A | Discharge status: Home / Self Care | Payer: Medicare Other | Source: Ambulatory Visit | Attending: Nephrology | Admitting: Nephrology

## 2019-08-13 ENCOUNTER — Other Ambulatory Visit: Payer: Self-pay

## 2019-08-13 ENCOUNTER — Encounter (HOSPITAL_COMMUNITY): Payer: Self-pay

## 2019-08-13 DIAGNOSIS — Z992 Dependence on renal dialysis: Secondary | ICD-10-CM

## 2019-08-13 DIAGNOSIS — Z539 Procedure and treatment not carried out, unspecified reason: Secondary | ICD-10-CM | POA: Insufficient documentation

## 2019-08-13 DIAGNOSIS — N186 End stage renal disease: Secondary | ICD-10-CM

## 2019-08-13 MED ORDER — SODIUM CHLORIDE 0.9 % IV SOLN: INTRAVENOUS | Status: DC

## 2019-08-13 NOTE — Progress Notes (Signed)
[name] , PA called back and states the procedure will need to be rescheduled. Pt informed and informed to wait to see [name] before he goes.

## 2019-08-13 NOTE — Progress Notes (Signed)
Allie, PA in to see pt. Pt alert and orient. No c/o pain or discomfort. Ambulated without problems.

## 2019-08-13 NOTE — Progress Notes (Signed)
Pt states he was not given any pre meds -- pt has allergy to IVP dye. Allie, PA called and informed. States she will check with the Dr and call me back.

## 2019-08-13 NOTE — Progress Notes (Signed)
IR.  Patient was scheduled for an image-guided right thigh shuntogram with possible intervention tentatively for today in IR. Patient has a known iodinated diagnostic agent allergy causing swelling, requiring pre-medications prior to procedures.  Per records, patient either receives his pre-medications from his dialysis center or from IR. Patient states that he did not receive his medications, therefore he did not take them prior to procedure. Discussed case with Dr. Vernard Gambles who recommends rescheduling procedure at this time.  Informed patient of above. Educated patient on importance of pre-medication regimen. Gave patient written prescription for pre-medication regimen (Prednisone 50 mg tablets, take one tablet 13, 7, and 1 hour prior to procedure, dispense 3 tablets with 0 refills; Benadryl 50 mg tablet, take one tablet 1 hour prior to procedure, dispense 1 tablet with 0 refills). Informed patient that our schedulers will call him to reschedule this procedure. All questions answered and concerns addressed. Patient conveys understanding and agrees with management.  Please call IR with questions/concerns.   Bea Graff Sacheen Arrasmith, PA-C 08/13/2019, 11:26 AM

## 2019-08-27 ENCOUNTER — Ambulatory Visit (HOSPITAL_COMMUNITY): Admission: RE | Admit: 2019-08-27 | Payer: Medicare Other | Source: Ambulatory Visit

## 2019-09-05 ENCOUNTER — Other Ambulatory Visit (HOSPITAL_COMMUNITY): Payer: Self-pay | Admitting: Nephrology

## 2019-09-05 DIAGNOSIS — N186 End stage renal disease: Secondary | ICD-10-CM

## 2019-09-05 DIAGNOSIS — Z992 Dependence on renal dialysis: Secondary | ICD-10-CM

## 2019-09-09 ENCOUNTER — Telehealth: Payer: Self-pay | Admitting: Physician Assistant

## 2019-09-09 NOTE — Telephone Encounter (Signed)
Request for pre medications to be called into patient's pharmacy listed in chart as patient misplaced previous paper prescription for same. Patient rescheduled for 09/17/19  Prednisone 50 mg PO 13 hrs, 7 hrs, and 1 hr prior to procedure (#3, zero refills) and Benadryl 50 mg PO 1 hr prior to procedure (#1, zero refills) called into [facility] [phone], [place], [phone] by this Probation officer at 1345 today.  Candiss Norse, PA-C

## 2019-09-14 ENCOUNTER — Other Ambulatory Visit: Payer: Self-pay | Admitting: Radiology

## 2019-09-16 ENCOUNTER — Other Ambulatory Visit: Payer: Self-pay | Admitting: Student

## 2019-09-17 ENCOUNTER — Other Ambulatory Visit (HOSPITAL_COMMUNITY): Payer: Self-pay | Admitting: Nephrology

## 2019-09-17 ENCOUNTER — Ambulatory Visit (HOSPITAL_COMMUNITY)
Admission: RE | Admit: 2019-09-17 | Discharge: 2019-09-17 | Disposition: A | Discharge status: Home / Self Care | Payer: Medicare Other | Source: Ambulatory Visit | Attending: Nephrology | Admitting: Nephrology

## 2019-09-17 ENCOUNTER — Other Ambulatory Visit: Payer: Self-pay

## 2019-09-17 ENCOUNTER — Encounter (HOSPITAL_COMMUNITY): Payer: Self-pay

## 2019-09-17 DIAGNOSIS — Z87891 Personal history of nicotine dependence: Secondary | ICD-10-CM | POA: Insufficient documentation

## 2019-09-17 DIAGNOSIS — M7989 Other specified soft tissue disorders: Secondary | ICD-10-CM | POA: Insufficient documentation

## 2019-09-17 DIAGNOSIS — T82510A Breakdown (mechanical) of surgically created arteriovenous fistula, initial encounter: Secondary | ICD-10-CM | POA: Diagnosis present

## 2019-09-17 DIAGNOSIS — N186 End stage renal disease: Secondary | ICD-10-CM

## 2019-09-17 DIAGNOSIS — Y841 Kidney dialysis as the cause of abnormal reaction of the patient, or of later complication, without mention of misadventure at the time of the procedure: Secondary | ICD-10-CM | POA: Insufficient documentation

## 2019-09-17 DIAGNOSIS — Z992 Dependence on renal dialysis: Secondary | ICD-10-CM | POA: Diagnosis not present

## 2019-09-17 DIAGNOSIS — F419 Anxiety disorder, unspecified: Secondary | ICD-10-CM | POA: Insufficient documentation

## 2019-09-17 DIAGNOSIS — I120 Hypertensive chronic kidney disease with stage 5 chronic kidney disease or end stage renal disease: Secondary | ICD-10-CM | POA: Diagnosis not present

## 2019-09-17 DIAGNOSIS — F329 Major depressive disorder, single episode, unspecified: Secondary | ICD-10-CM | POA: Diagnosis not present

## 2019-09-17 HISTORY — PX: IR DIALY SHUNT INTRO NEEDLE/INTRACATH INITIAL W/IMG RIGHT: IMG6115

## 2019-09-17 HISTORY — PX: IR US GUIDE VASC ACCESS RIGHT: IMG2390

## 2019-09-17 LAB — CBC
HCT: 42.7 % (ref 39.0–52.0)
Hemoglobin: 13.9 g/dL (ref 13.0–17.0)
MCH: 31.9 pg (ref 26.0–34.0)
MCHC: 32.6 g/dL (ref 30.0–36.0)
MCV: 97.9 fL (ref 80.0–100.0)
Platelets: 259 10*3/uL (ref 150–400)
RBC: 4.36 MIL/uL (ref 4.22–5.81)
RDW: 13.6 % (ref 11.5–15.5)
WBC: 6.5 10*3/uL (ref 4.0–10.5)
nRBC: 0 % (ref 0.0–0.2)

## 2019-09-17 LAB — BASIC METABOLIC PANEL
Anion gap: 18 — ABNORMAL HIGH (ref 5–15)
BUN: 52 mg/dL — ABNORMAL HIGH (ref 6–20)
CO2: 23 mmol/L (ref 22–32)
Calcium: 10.1 mg/dL (ref 8.9–10.3)
Chloride: 96 mmol/L — ABNORMAL LOW (ref 98–111)
Creatinine, Ser: 12.74 mg/dL — ABNORMAL HIGH (ref 0.61–1.24)
GFR calc Af Amer: 5 mL/min — ABNORMAL LOW (ref >60)
GFR calc non Af Amer: 4 mL/min — ABNORMAL LOW (ref >60)
Glucose, Bld: 123 mg/dL — ABNORMAL HIGH (ref 70–99)
Potassium: 5.8 mmol/L — ABNORMAL HIGH (ref 3.5–5.1)
Sodium: 137 mmol/L (ref 135–145)

## 2019-09-17 LAB — PROTIME-INR
INR: 0.9 (ref 0.8–1.2)
Prothrombin Time: 12.3 seconds (ref 11.4–15.2)

## 2019-09-17 MED ORDER — FENTANYL CITRATE (PF) 100 MCG/2ML IJ SOLN
INTRAMUSCULAR | Status: AC
  Filled 2019-09-17: qty 4

## 2019-09-17 MED ORDER — LIDOCAINE HCL 1 % IJ SOLN
INTRAMUSCULAR | Status: AC
  Filled 2019-09-17: qty 20

## 2019-09-17 MED ORDER — IOHEXOL 300 MG/ML  SOLN
100.0000 mL | Freq: Once | INTRAMUSCULAR | Status: AC | PRN
  Administered 2019-09-17: 14:00:00 40 mL via INTRAVENOUS

## 2019-09-17 MED ORDER — MIDAZOLAM HCL 2 MG/2ML IJ SOLN
INTRAMUSCULAR | Status: AC
  Filled 2019-09-17: qty 6

## 2019-09-17 MED ORDER — MIDAZOLAM HCL 2 MG/2ML IJ SOLN
INTRAMUSCULAR | Status: AC | PRN
  Administered 2019-09-17: 0.5 mg via INTRAVENOUS
  Administered 2019-09-17: 1 mg via INTRAVENOUS

## 2019-09-17 MED ORDER — FENTANYL CITRATE (PF) 100 MCG/2ML IJ SOLN
INTRAMUSCULAR | Status: AC | PRN
  Administered 2019-09-17: 50 ug via INTRAVENOUS
  Administered 2019-09-17 (×2): 25 ug via INTRAVENOUS

## 2019-09-17 MED ORDER — LIDOCAINE HCL 1 % IJ SOLN
INTRAMUSCULAR | Status: AC | PRN
  Administered 2019-09-17: 5 mL

## 2019-09-17 MED ORDER — SODIUM CHLORIDE 0.9 % IV SOLN: INTRAVENOUS | Status: DC

## 2019-09-17 MED ORDER — DIPHENHYDRAMINE HCL 50 MG/ML IJ SOLN
INTRAMUSCULAR | Status: AC
  Administered 2019-09-17: 50 mg
  Filled 2019-09-17: qty 1

## 2019-09-17 NOTE — H&P (Signed)
Chief Complaint: Patient was seen in consultation today for right leg dialysis graft evaluation and possible intervention at the request of [place]   Supervising Physician: [name]  Patient Status: [facility] - Out-pt  History of Present Illness: [name] is a 48 y.o. male   ESRD Rt leg thigh graft--- known to IR for many interventions Last intervention 05/14/19: IMPRESSION: 1. 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 2. 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 3. Successful percutaneous transluminal angioplasty using a combination of drug coated and standard balloon technology. All 3 lesions were treated to a maximum of 14 mm with less than 20% residual stenosis at each treatment site.  Right leg swelling for over month Has been able to complete dialysis-- last dialysis yesterday  Scheduled now for right thigh graft evaluation and possible intervention Possible tunneled dialysis catheter if needed  Allergic to contrast-- has taken 13 hr pre meds  Past Medical History:  Diagnosis Date  . Anemia   . Anxiety   . Cough    DRY    . Depression   . ESRD on hemodialysis ([place])    HD [facility] MWF  . Headache(784.0)   . Hypertension   . Muscle spasms of neck    BACK, NECK  . Pneumonia 2015ish  . Thyroid disease     Past Surgical History:  Procedure Laterality Date  . ARTERIOVENOUS GRAFT PLACEMENT Left    "forearm, it's not working; thigh"   . ARTERIOVENOUS GRAFT PLACEMENT Left 11/09/2015  . AV FISTULA PLACEMENT Right 01/10/2017   Procedure: INSERTION OF ARTERIOVENOUS Right thigh GORE-TEX GRAFT;  Surgeon: [name], MD;  Location: [place];  Service: Vascular;  Laterality: Right;  . FALSE ANEURYSM REPAIR Left 11/09/2015   Procedure: REPAIR OF LEFT FEMORAL PSEUDOANEURYSM; REVISION  OF LEFT THIGH ARTERIOVENOUS GRAFT USING 6MM X 10 CM GORETEX GRAFT  ;  Surgeon: [name], MD;  Location: [facility] OR;  Service: Vascular;  Laterality: Left;  . IR AV DIALY SHUNT INTRO NEEDLE/INTRACATH INITIAL W/PTA/IMG RIGHT Right 02/06/2019  . IR AV DIALY SHUNT INTRO NEEDLE/INTRACATH INITIAL W/PTA/IMG RIGHT Right 05/14/2019  . IR GENERIC HISTORICAL  07/15/2016   IR US GUIDE VASC ACCESS LEFT 07/15/2016 [name], MD [facility]  . IR GENERIC HISTORICAL Left 07/15/2016   IR THROMBECTOMY AV FISTULA W/THROMBOLYSIS/PTA INC/SHUNT/IMG LEFT 07/15/2016 [name], MD [facility]  . IR GENERIC HISTORICAL  10/05/2016   IR US GUIDE VASC ACCESS LEFT 10/05/2016 [name], MD [facility]  . IR GENERIC HISTORICAL Left 10/05/2016   IR THROMBECTOMY AV FISTULA W/THROMBOLYSIS/PTA INC/SHUNT/IMG LEFT 10/05/2016 [name], MD [facility]  . IR GENERIC HISTORICAL  10/08/2016   IR FLUORO GUIDE CV LINE RIGHT 10/08/2016 [name], MD [facility]  . IR GENERIC HISTORICAL  10/08/2016   IR US GUIDE VASC ACCESS RIGHT 10/08/2016 [name], MD [facility]  . IR REMOVAL TUN CV CATH W/O FL  03/16/2017  . IR THROMBECTOMY AV FISTULA W/THROMBOLYSIS/PTA INC/SHUNT/IMG RIGHT Right 06/16/2018  . IR THROMBECTOMY AV FISTULA W/THROMBOLYSIS/PTA INC/SHUNT/IMG RIGHT Right 06/17/2018  . IR US GUIDE VASC ACCESS RIGHT  06/17/2018  . IR US GUIDE VASC ACCESS RIGHT  06/16/2018  . IR US GUIDE VASC ACCESS RIGHT  02/06/2019  . IR US GUIDE VASC ACCESS RIGHT  05/14/2019  . PARATHYROIDECTOMY N/A 04/24/2014   Procedure: TOTAL 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;  Surgeon: Merlinda Frederick  Gerkin, MD;  Location: [place];  Service: General;  Laterality: N/A;  NECK AND LEFT FOREARM  . PERIPHERAL VASCULAR CATHETERIZATION N/A 05/05/2016   Procedure: A/V Shuntogram;  Surgeon: Conrad Brainerd, MD;  Location: [place] CV LAB;  Service: Cardiovascular;  Laterality: N/A;  . PERIPHERAL VASCULAR CATHETERIZATION Left 05/05/2016   Procedure: Peripheral Vascular Balloon Angioplasty;  Surgeon: Conrad [place], MD;  Location: [place]  CV LAB;  Service: Cardiovascular;  Laterality: Left;  . PSEUDOANEURYSM REPAIR Left 11/09/2015  . THROMBECTOMY / ARTERIOVENOUS GRAFT REVISION Left 08/18/2015   thigh  . THROMBECTOMY AND REVISION OF ARTERIOVENTOUS (AV) GORETEX  GRAFT Left 08/23/2015   Procedure: THROMBECTOMY AND REVISION OF ARTERIOVENTOUS (AV) GORETEX  GRAFT LEFT THIGH ;  Surgeon: [name], MD;  Location: [place];  Service: Vascular;  Laterality: Left;  . THROMBECTOMY W/ EMBOLECTOMY Left 08/18/2015   Procedure: THROMBECTOMY  AND REVISION ARTERIOVENOUS GORE-TEX GRAFT/LEFT THIGH;  Surgeon: [name], MD;  Location: [place];  Service: Vascular;  Laterality: Left;  . THROMBECTOMY W/ EMBOLECTOMY Right 06/19/2018   Procedure: THROMBECTOMY AND REVISION OF RIGHT THIGH  ARTERIOVENOUS GORE-TEX GRAFT;  Surgeon: [name], MD;  Location: [place];  Service: Vascular;  Laterality: Right;  . UPPER EXTREMITY VENOGRAPHY Bilateral 12/27/2016   Procedure: Upper Extremity Venography;  Surgeon: [name], MD;  Location: [place] CV LAB;  Service: Cardiovascular;  Laterality: Bilateral;  . VENOGRAM Left 05/05/2016   Procedure: Venogram;  Surgeon: Conrad [place], MD;  Location: [place] CV LAB;  Service: Cardiovascular;  Laterality: Left;  lower extremity    Allergies: Lisinopril, Ivp dye [iodinated diagnostic agents], and Solu-medrol [methylprednisolone]  Medications: Prior to Admission medications   Medication Sig Start Date End Date Taking? Authorizing Provider  acetaminophen (TYLENOL) 500 MG tablet Take 1,000 mg by mouth daily as needed (for pain or headaches).     [provider]  benzonatate (TESSALON) 100 MG capsule Take 1 capsule (100 mg total) by mouth every 8 (eight) hours. Patient not taking: Reported on 06/15/2018 07/25/17   Larene Pickett, PA-C  calcium carbonate (TUMS - DOSED IN MG ELEMENTAL CALCIUM) 500 MG chewable tablet Chew 4 tablets by mouth daily. 4 tabs w/ meals    [provider]  fluticasone (FLONASE) 50 MCG/ACT nasal spray Place 2 sprays into both nostrils daily. Patient not taking: Reported on 06/15/2018 07/25/17   Larene Pickett, PA-C  midodrine (PROAMATINE) 10 MG tablet Take 1 tablet (10 mg total) by mouth 3 (three) times daily with meals. Patient not taking: Reported on 10/16/2018 06/20/18   Thurnell Lose, MD  oxyCODONE-acetaminophen (PERCOCET/ROXICET) 5-325 MG tablet Take 1 tablet by mouth every 4 (four) hours as needed for moderate pain. Patient not taking: Reported on 06/15/2018 01/11/17   Ulyses Amor, PA-C     Family History  Problem Relation Age of Onset  . Renal Disease Neg Hx     Social History   Socioeconomic History  . Marital status: Married    Spouse name: Not on file  . Number of children: Not on file  . Years of education: Not on file  . Highest education level: Not on file  Occupational History  . Not on file  Tobacco Use  . Smoking status: Former Smoker    Quit date: 03/21/1986    Years since quitting: 33.5  . Smokeless tobacco: Never Used  Substance and Sexual Activity  . Alcohol use: No    Alcohol/week: 0.0 standard  drinks  . Drug use: No  . Sexual activity: Not on file  Other Topics Concern  . Not on file  Social History Narrative  . Not on file   Social Determinants of Health   Financial Resource Strain:   . Difficulty of Paying Living Expenses: Not on file  Food Insecurity:   . Worried About Charity fundraiser in the Last Year: Not on file  . Ran Out of Food in the Last Year: Not on file  Transportation Needs:   . Lack of Transportation (Medical): Not on file  . Lack of Transportation (Non-Medical): Not on file  Physical Activity:   . Days of Exercise per Week: Not on file  . Minutes of Exercise per Session: Not on file  Stress:   . Feeling of Stress : Not on file  Social Connections:   . Frequency of Communication with Friends and Family: Not on file  . Frequency of Social Gatherings with Friends and  Family: Not on file  . Attends Religious Services: Not on file  . Active Member of Clubs or Organizations: Not on file  . Attends Archivist Meetings: Not on file  . Marital Status: Not on file    Review of Systems: A 12 point ROS discussed and pertinent positives are indicated in the HPI above.  All other systems are negative.  Review of Systems  Constitutional: Negative for activity change, fatigue and fever.  Respiratory: Negative for cough and shortness of breath.   Gastrointestinal: Negative for abdominal pain.  Psychiatric/Behavioral: Negative for behavioral problems and confusion.    Vital Signs: Pulse 80   Temp 97.8 F (36.6 C) (Skin)   Resp 18   Ht 6' (1.829 m)   Wt 167 lb 8.8 oz (76 kg)   SpO2 100%   BMI 22.72 kg/m   Physical Exam Vitals reviewed.  Cardiovascular:     Rate and Rhythm: Normal rate and regular rhythm.     Heart sounds: Normal heart sounds.  Pulmonary:     Effort: Pulmonary effort is normal.     Breath sounds: Normal breath sounds.  Abdominal:     Palpations: Abdomen is soft.  Musculoskeletal:        General: Normal range of motion.     Right lower leg: No edema.     Comments: Right thigh graft with +pulse and + thrill  Skin:    General: Skin is warm and dry.  Neurological:     Mental Status: He is alert and oriented to person, place, and time.  Psychiatric:        Mood and Affect: Mood normal.        Behavior: Behavior normal.        Thought Content: Thought content normal.        Judgment: Judgment normal.     Imaging: No results found.  Labs:  CBC: Recent Labs    10/15/18 2304 02/06/19 0700 05/14/19 0902  WBC 5.4 5.1 5.5  HGB 10.6* 13.2 14.6  HCT 34.5* 39.1 45.9  PLT 279 279 234    COAGS: Recent Labs    02/06/19 0700  INR 1.0    BMP: Recent Labs    10/15/18 2304 02/06/19 0700 05/14/19 0902  NA 134* 139 139  K 4.4 4.6 4.4  CL 94* 96* 93*  CO2 27 24 29   GLUCOSE 93 134* 136*  BUN 30* 54* 30*    CALCIUM 8.7* 8.0* 9.1  CREATININE 10.84* 15.71* 11.83*  GFRNONAA 5* 3* 5*  GFRAA 6* 4* 5*    LIVER FUNCTION TESTS: Recent Labs    10/15/18 2304  BILITOT 0.6  AST 25  ALT 13  ALKPHOS 53  PROT 8.3*  ALBUMIN 3.5    TUMOR MARKERS: No results for input(s): AFPTM, CEA, CA199, CHROMGRNA in the last 8760 hours.  Assessment and Plan:  ESRD Right thigh graft with many interventions in IR Rt leg swelling x 1 mo Last dialysis yersterday-  Complete Scheduled now for graft evaluation and possible intervention Possible tunneled dialysis catheter placement Pt is aware of procedure benefits and risks including but not limited to Infection; bleeding; vessel damage Agreeable to proceed Consent signed in chart   Thank you for this interesting consult.  I greatly enjoyed meeting [name] and look forward to participating in their care.  A copy of this report was sent to the requesting provider on this date.  Electronically Signed: Lavonia Drafts, PA-C 09/17/2019, 11:24 AM   I spent a total of    25 Minutes in face to face in clinical consultation, greater than 50% of which was counseling/coordinating care for right thigh graft evaluation/intervention

## 2019-09-17 NOTE — Procedures (Signed)
Interventional Radiology Procedure Note  Hx: Patient complains of new right leg swelling.  The loop graft is operational, with last complete dialysis yesterday.    Procedure: US guided right loop graft access for angiogram.    Findings: Loop graft patent.  No inflow (AV) stenosis.  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    Marland Kitchen  Complications: None  Recommendations:  - 1 hr dc home - loop graft ok to use - refer to [facility] to discuss iliac vein occlusion and stenting.  - Do not submerge for 7 days - Routine care   Signed,  Dulcy Fanny. Earleen Newport, DO

## 2019-09-17 NOTE — Discharge Instructions (Signed)

## 2019-09-17 NOTE — Sedation Documentation (Signed)
The patient is experiencing anxiety, he states"last time I was in such pain, it was horrible, I have a headache even now." The patient fidgets in bed. Dr. Damita Dunnings has given verbal order for 1 mg versed. The patients vitals remain stable and is being monitored by the writer.

## 2019-09-25 ENCOUNTER — Other Ambulatory Visit: Payer: Self-pay | Admitting: Interventional Radiology

## 2019-09-25 DIAGNOSIS — I70209 Unspecified atherosclerosis of native arteries of extremities, unspecified extremity: Secondary | ICD-10-CM

## 2019-10-28 ENCOUNTER — Ambulatory Visit (HOSPITAL_COMMUNITY)
Admission: RE | Admit: 2019-10-28 | Discharge: 2019-10-28 | Disposition: A | Discharge status: Home / Self Care | Payer: Self-pay | Source: Ambulatory Visit | Attending: Interventional Radiology | Admitting: Interventional Radiology

## 2019-10-28 ENCOUNTER — Other Ambulatory Visit: Payer: Self-pay

## 2019-10-28 DIAGNOSIS — I70209 Unspecified atherosclerosis of native arteries of extremities, unspecified extremity: Secondary | ICD-10-CM | POA: Insufficient documentation

## 2019-11-01 ENCOUNTER — Other Ambulatory Visit (HOSPITAL_COMMUNITY): Payer: Self-pay

## 2019-11-05 ENCOUNTER — Ambulatory Visit
Admission: RE | Admit: 2019-11-05 | Discharge: 2019-11-05 | Disposition: A | Discharge status: Home / Self Care | Payer: Self-pay | Source: Ambulatory Visit | Attending: Interventional Radiology | Admitting: Interventional Radiology

## 2019-11-05 ENCOUNTER — Other Ambulatory Visit: Payer: Self-pay

## 2019-11-05 DIAGNOSIS — I70209 Unspecified atherosclerosis of native arteries of extremities, unspecified extremity: Secondary | ICD-10-CM

## 2019-11-26 ENCOUNTER — Encounter: Payer: Self-pay | Admitting: *Deleted

## 2019-11-26 ENCOUNTER — Other Ambulatory Visit: Payer: Self-pay

## 2019-11-26 ENCOUNTER — Ambulatory Visit
Admission: RE | Admit: 2019-11-26 | Discharge: 2019-11-26 | Disposition: A | Discharge status: Home / Self Care | Payer: Self-pay | Source: Ambulatory Visit | Attending: Interventional Radiology | Admitting: Interventional Radiology

## 2019-11-26 HISTORY — PX: IR RADIOLOGIST EVAL & MGMT: IMG5224

## 2019-11-26 NOTE — Consult Note (Signed)
Chief Complaint: Leg Swelling   Referring Physician(s): Dr. Jamal Maes.   History of Present Illness: [name] is a 49 y.o. male presenting today as a scheduled consultation to [place] [facility], kindly referred by Dr. Lorrene Reid, for evaluation of right leg swelling.   Anthony. [name] joins [place] today by telemedicine visit, given the COVID situation.  I confirmed his identity with 2 personal identifiers.    He tells me that his right leg continues to be a problem with swelling.  This was his main complain when I met him 09/17/2019 when he was referred for a fistulagram.  His complaint at the time was for swelling.  He has not had a duplex exam of the graft.   The graft was placed 01/10/2017, 79m PTFE CFA to SFJ (which has now been revised with 726mjump interposition graft to the CFV).    He has had multiple prior interventions on this right femoral loop graft, including thrombectomy to restore flow, PTA of iliac stenosis, and surgical revision.    On the circuit study performed 09/17/19, we identified a patent and flowing graft, with iliac stenosis/occlusion.  Interval MRI shows the same, with either occluded iliac system and collateral flow, or critical stenosis of the mid iliac with collateral flow.  The IVC and the left iliac venous system appear WNL.   He states that his swelling is significant, and is always worst at the end of the day.  He works at [employer] stands all day long.  He is very uncomfortable at the end of the day.  The swelling is somewhat better in the morning, but is never resolved.   He is not currently wearing compression stockings, which I suggested.  He denies any wounds at this time.   He continues to use the right femoral loop graft successfully.   Past Medical History:  Diagnosis Date  . Anemia   . Anxiety   . Cough    DRY    . Depression   . [place]   [place] [facility] M[facility]  . Headache(784.0)   .  Hypertension   . Muscle spasms of neck    BACK, NECK  . Pneumonia 2015ish  . Thyroid disease     Past Surgical History:  Procedure Laterality Date  . ARTERIOVENOUS GRAFT PLACEMENT Left    "forearm, it's not working; thigh"   . ARTERIOVENOUS GRAFT PLACEMENT Left 11/09/2015  . AV FISTULA PLACEMENT Right 01/10/2017   Procedure: INSERTION OF ARTERIOVENOUS Right thigh GORE-TEX GRAFT;  Surgeon: ChElam DutchMD;  [facility];  Laterality: Right;  . FALSE ANEURYSM REPAIR Left 11/09/2015   Procedure: REPAIR OF LEFT FEMORAL PSEUDOANEURYSM; REVISION  OF LEFT THIGH ARTERIOVENOUS GRAFT USING 6MM X 10 CM GORETEX GRAFT ;  Surgeon: ToRosetta PosnerMD;  [facility];  Service: Vascular;  Laterality: Left;  . IR AV DIALY SHUNT INTRO NEEDLE/INTRACATH INITIAL W/PTA/IMG RIGHT Right 02/06/2019  . IR AV DIALY SHUNT INTRO NEEDLE/INTRACATH INITIAL W/PTA/IMG RIGHT Right 05/14/2019  . IR DIALY SHUNT INTRO NEEDLE/INTRACATH INITIAL W/IMG RIGHT Right 09/17/2019  . IR GENERIC HISTORICAL  07/15/2016   IR [place] VASC ACCESS LEFT 07/15/2016 JoSandi MariscalMD [facility]  . IR GENERIC HISTORICAL Left 07/15/2016   IR THROMBECTOMY AV FISTULA W/THROMBOLYSIS/PTA INC/SHUNT/IMG LEFT 07/15/2016 JoSandi MariscalMD [facility]  . IR GENERIC HISTORICAL  10/05/2016   IR [place] VASC ACCESS LEFT 10/05/2016 MiLegrand Como  Annamaria Boots, MD [facility]  . IR GENERIC HISTORICAL Left 10/05/2016   IR THROMBECTOMY AV FISTULA W/THROMBOLYSIS/PTA INC/SHUNT/IMG LEFT 10/05/2016 [name], MD [facility]  . IR GENERIC HISTORICAL  10/08/2016   IR FLUORO GUIDE CV LINE RIGHT 10/08/2016 [name], MD [facility]  . IR GENERIC HISTORICAL  10/08/2016   IR US GUIDE VASC ACCESS RIGHT 10/08/2016 [name], MD [facility]  . IR REMOVAL TUN CV CATH W/O FL  03/16/2017  . IR THROMBECTOMY AV FISTULA W/THROMBOLYSIS/PTA INC/SHUNT/IMG RIGHT Right 06/16/2018  . IR THROMBECTOMY AV FISTULA W/THROMBOLYSIS/PTA INC/SHUNT/IMG RIGHT Right 06/17/2018  . IR  US GUIDE VASC ACCESS RIGHT  06/17/2018  . IR US GUIDE VASC ACCESS RIGHT  06/16/2018  . IR US GUIDE VASC ACCESS RIGHT  02/06/2019  . IR US GUIDE VASC ACCESS RIGHT  05/14/2019  . IR US GUIDE VASC ACCESS RIGHT  09/17/2019  . PARATHYROIDECTOMY N/A 04/24/2014   Procedure: TOTAL 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;  Surgeon: [name], MD;  Location: [facility];  Service: General;  Laterality: N/A;  NECK AND LEFT FOREARM  . PERIPHERAL VASCULAR CATHETERIZATION N/A 05/05/2016   Procedure: A/V Shuntogram;  Surgeon: Conrad Downey, MD;  Location: [facility];  Service: Cardiovascular;  Laterality: N/A;  . PERIPHERAL VASCULAR CATHETERIZATION Left 05/05/2016   Procedure: Peripheral Vascular Balloon Angioplasty;  Surgeon: Conrad Elko, MD;  Location: [facility];  Service: Cardiovascular;  Laterality: Left;  . PSEUDOANEURYSM REPAIR Left 11/09/2015  . THROMBECTOMY / ARTERIOVENOUS GRAFT REVISION Left 08/18/2015   thigh  . THROMBECTOMY AND REVISION OF ARTERIOVENTOUS (AV) GORETEX  GRAFT Left 08/23/2015   Procedure: THROMBECTOMY AND REVISION OF ARTERIOVENTOUS (AV) GORETEX  GRAFT LEFT THIGH ;  Surgeon: [name], MD;  Location: [facility];  Service: Vascular;  Laterality: Left;  . THROMBECTOMY W/ EMBOLECTOMY Left 08/18/2015   Procedure: THROMBECTOMY  AND REVISION ARTERIOVENOUS GORE-TEX GRAFT/LEFT THIGH;  Surgeon: [name], MD;  Location: [facility];  Service: Vascular;  Laterality: Left;  . THROMBECTOMY W/ EMBOLECTOMY Right 06/19/2018   Procedure: THROMBECTOMY AND REVISION OF RIGHT THIGH  ARTERIOVENOUS GORE-TEX GRAFT;  Surgeon: [name], MD;  Location: [facility];  Service: Vascular;  Laterality: Right;  . UPPER EXTREMITY VENOGRAPHY Bilateral 12/27/2016   Procedure: Upper Extremity Venography;  Surgeon: [name], MD;  Location: [facility];  Service: Cardiovascular;  Laterality: Bilateral;  . VENOGRAM Left 05/05/2016   Procedure: Venogram;  Surgeon: Conrad Citrus, MD;   Location: [facility];  Service: Cardiovascular;  Laterality: Left;  lower extremity    Allergies: Lisinopril, Ivp dye [iodinated diagnostic agents], and Solu-medrol [methylprednisolone]  Medications: Prior to Admission medications   Medication Sig Start Date End Date Taking? Authorizing Lakrista Scaduto  acetaminophen (TYLENOL) 500 MG tablet Take 1,000 mg by mouth daily as needed (for pain or headaches).     Ajah Vanhoose, Historical, MD  benzonatate (TESSALON) 100 MG capsule Take 1 capsule (100 mg total) by mouth every 8 (eight) hours. Patient not taking: Reported on 06/15/2018 07/25/17   Larene Pickett, PA-C  calcium carbonate (TUMS - DOSED IN MG ELEMENTAL CALCIUM) 500 MG chewable tablet Chew 4 tablets by mouth daily. 4 tabs w/ meals    Kenneth Cuaresma, Historical, MD  fluticasone (FLONASE) 50 MCG/ACT nasal spray Place 2 sprays into both nostrils daily. Patient not taking: Reported on 06/15/2018 07/25/17   Larene Pickett, PA-C  midodrine (PROAMATINE) 10 MG tablet Take 1 tablet (10 mg total) by mouth  3 (three) times daily with meals. Patient not taking: Reported on 10/16/2018 06/20/18   Thurnell Lose, MD  oxyCODONE-acetaminophen (PERCOCET/ROXICET) 5-325 MG tablet Take 1 tablet by mouth every 4 (four) hours as needed for moderate pain. Patient not taking: Reported on 06/15/2018 01/11/17   Ulyses Amor, PA-C     Family History  Problem Relation Age of Onset  . Renal Disease Neg Hx     Social History   Socioeconomic History  . Marital status: Married    Spouse name: Not on file  . Number of children: Not on file  . Years of education: Not on file  . Highest education level: Not on file  Occupational History  . Not on file  Tobacco Use  . Smoking status: Former Smoker    Quit date: 03/21/1986    Years since quitting: 33.7  . Smokeless tobacco: Never Used  Substance and Sexual Activity  . Alcohol use: No    Alcohol/week: 0.0 standard drinks  . Drug use: No  . Sexual activity: Not on  file  Other Topics Concern  . Not on file  Social History Narrative  . Not on file   Social Determinants of Health   Financial Resource Strain:   . Difficulty of Paying Living Expenses: Not on file  Food Insecurity:   . Worried About Charity fundraiser in the Last Year: Not on file  . Ran Out of Food in the Last Year: Not on file  Transportation Needs:   . Lack of Transportation (Medical): Not on file  . Lack of Transportation (Non-Medical): Not on file  Physical Activity:   . Days of Exercise per Week: Not on file  . Minutes of Exercise per Session: Not on file  Stress:   . Feeling of Stress : Not on file  Social Connections:   . Frequency of Communication with Friends and Family: Not on file  . Frequency of Social Gatherings with Friends and Family: Not on file  . Attends Religious Services: Not on file  . Active Member of Clubs or Organizations: Not on file  . Attends Archivist Meetings: Not on file  . Marital Status: Not on file       Review of Systems  Review of Systems: A 12 point ROS discussed and pertinent positives are indicated in the HPI above.  All other systems are negative.  Physical Exam No direct physical exam was performed (except for noted visual exam findings with Video Visits).    Vital Signs: There were no vitals taken for this visit.  Imaging: Anthony PELVIS WO CONTRAST  Result Date: 10/29/2019 CLINICAL DATA:  49 year old male with a history of right lower extremity swelling. History of femoral loop dialysis graft and prior stenosis of the iliac vein on prior shuntogram. EXAM: MRI PELVIS WITHOUT CONTRAST TECHNIQUE: Multiplanar multisequence Anthony imaging of the pelvis was performed. No intravenous contrast was administered. COMPARISON:  None. FINDINGS: Vascular: Arterial: Bilateral iliac arteries remain patent with no high-grade stenosis identified. Mild tortuosity. The proximal femoral arteries are patent. The Anthony demonstrates right femoral loop  graft which is patent proximally. The distal aspect of the loop graft is not well evaluated. Venous: The infrarenal IVC is patent. Visualized left femoral vein in the proximal thigh is patent. Patent external iliac vein. Patent left internal iliac venous system with patent confluence to the common iliac vein. Common iliac vein patent without evidence of high-grade stenosis or occlusion by the Anthony. The Anthony is of  decreased fidelity within the right iliac system, however, there is evidence of attenuation of the common iliac vein at the confluence to the IVC, as well as attenuation of the peripheral external iliac vein. Prominent collateral pelvic veins on the right. Nonvascular: Nonspecific edema within the soft tissues anterior thighs bilaterally. There is focal fluid anterior to the left femoral vasculature, persistent and postsurgical changes. Unremarkable musculoskeletal system. Reactive lymph nodes of the bilateral inguinal region. Unremarkable appearance of the pelvic structures. IMPRESSION: The Anthony venogram is of decreased fidelity, however, there appears to be at least high-grade stenosis or occlusion of the right iliac vein with pelvic draining collaterals, compatible with the findings prior fistulagram. Patent left-sided iliac venous system. Edema with the anterior bilateral thighs with postsurgical changes of the bilateral inguinal region and likely reactive adenopathy. Signed, Dulcy Fanny. Dellia Nims, RPVI [facility] Rad[facility] Electronically Signed   By: [name] D.O.   On: 10/29/2019 16:22    Labs:  CBC: Recent Labs    02/06/19 0700 05/14/19 0902 09/17/19 1111  WBC 5.1 5.5 6.5  HGB 13.2 14.6 13.9  HCT 39.1 45.9 42.7  PLT 279 234 259    COAGS: Recent Labs    02/06/19 0700 09/17/19 1111  INR 1.0 0.9    BMP: Recent Labs    02/06/19 0700 05/14/19 0902 09/17/19 1111  NA 139 139 137  K 4.6 4.4 5.8*  CL 96* 93* 96*  CO2 '24 29 23   '$ GLUCOSE 134* 136* 123*  BUN 54* 30* 52*  CALCIUM 8.0* 9.1 10.1  CREATININE 15.71* 11.83* 12.74*  GFRNONAA 3* 5* 4*  GFRAA 4* 5* 5*    LIVER FUNCTION TESTS: No results for input(s): BILITOT, AST, ALT, ALKPHOS, PROT, ALBUMIN in the last 8760 hours.  TUMOR MARKERS: No results for input(s): AFPTM, CEA, CA199, CHROMGRNA in the last 8760 hours.  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   This iliac vein occlusion is contributing to significant life-style limiting right leg swelling, exacerbated by the circuit flow, as well as a threat to the durability and usefulness of the loop graft.    I discussed with him the concepts of angiogram (with which he is familiar because of prior dialysis intervention), and angioplasty with stenting, to treat the iliac occlusion.  I do think this will require a stent for increased longevity.    Risk/benefit analysis was discussed, including bleeding, infection, contrast reaction (he will need pretreatment), organ failure, need for further surgery/intervention, hospitalization including possible ICU admission, cardiopulmonary collapse, death.   He understands and would like to proceed.   Plan: - Standard pretreatment for contrast allergy - venous angiogram of right lower extremity, with possible intervention to include possible PTA, possible stenting - I have encouraged him to use compression stockings to help his swelling  Thank you for this interesting consult.  I greatly enjoyed meeting [name] and look forward to participating in their care.  A copy of this report was sent to the requesting [name] on this date.  Electronically Signed: [name] 11/26/2019, 8:47 AM   I spent a total of  40 Minutes   in remote  clinical consultation,  greater than 50% of which was counseling/coordinating care for right iliac vein occlusion, right leg swelling, possible PTA/stenting.  Visit type: Audio only (telephone). Audio (no video) only due to patient's lack of internet/smartphone capability. Alternative for in-person consultation at [facility], [address], [place], [place]. This visit type was conducted due to national recommendations for restrictions regarding the COVID-19 Pandemic (e.g. social distancing).  This format is felt to be most appropriate for this patient at this time.  All issues noted in this document were discussed and addressed.

## 2019-12-02 ENCOUNTER — Other Ambulatory Visit (HOSPITAL_COMMUNITY): Payer: Self-pay | Admitting: Interventional Radiology

## 2019-12-02 DIAGNOSIS — I70209 Unspecified atherosclerosis of native arteries of extremities, unspecified extremity: Secondary | ICD-10-CM

## 2019-12-03 ENCOUNTER — Telehealth: Payer: Self-pay | Admitting: Student

## 2019-12-03 NOTE — Telephone Encounter (Signed)
IR.  Patient is scheduled for an image-guided right lower extremity arteriogram with possible intervention 12/10/2019 with Dr. Earleen Newport. He has an iodine dye allergy causing swelling that requires pre-medications prior to procedures.  Faxed filled prescriptions to [facility] [phone]) at 0926: 1- Prednisone 50 mg tablets; take one tablet by mouth 13 hours, 7 hours, and 1 hour prior to procedure on 12/10/2019; dispense 3 tablets with 0 refills. 2- Benadryl 50 mg tablets; take one tablet by mouth 1 hour prior to procedure 12/10/2019; dispense 1 tablet with 0 refills.   Bea Graff Ladena Jacquez, PA-C 12/03/2019, 9:56 AM

## 2019-12-09 ENCOUNTER — Other Ambulatory Visit: Payer: Self-pay | Admitting: Student

## 2019-12-10 ENCOUNTER — Other Ambulatory Visit (HOSPITAL_COMMUNITY): Payer: Self-pay | Admitting: Interventional Radiology

## 2019-12-10 ENCOUNTER — Other Ambulatory Visit: Payer: Self-pay

## 2019-12-10 ENCOUNTER — Ambulatory Visit (HOSPITAL_COMMUNITY)
Admission: RE | Admit: 2019-12-10 | Discharge: 2019-12-10 | Disposition: A | Discharge status: Home / Self Care | Payer: Self-pay | Source: Ambulatory Visit | Attending: Interventional Radiology | Admitting: Interventional Radiology

## 2019-12-10 DIAGNOSIS — I70209 Unspecified atherosclerosis of native arteries of extremities, unspecified extremity: Secondary | ICD-10-CM

## 2019-12-10 DIAGNOSIS — I120 Hypertensive chronic kidney disease with stage 5 chronic kidney disease or end stage renal disease: Secondary | ICD-10-CM | POA: Insufficient documentation

## 2019-12-10 DIAGNOSIS — Y832 Surgical operation with anastomosis, bypass or graft as the cause of abnormal reaction of the patient, or of later complication, without mention of misadventure at the time of the procedure: Secondary | ICD-10-CM | POA: Insufficient documentation

## 2019-12-10 DIAGNOSIS — N186 End stage renal disease: Secondary | ICD-10-CM | POA: Insufficient documentation

## 2019-12-10 DIAGNOSIS — Z95828 Presence of other vascular implants and grafts: Secondary | ICD-10-CM | POA: Insufficient documentation

## 2019-12-10 DIAGNOSIS — T82858A Stenosis of vascular prosthetic devices, implants and grafts, initial encounter: Secondary | ICD-10-CM | POA: Insufficient documentation

## 2019-12-10 DIAGNOSIS — Z91041 Radiographic dye allergy status: Secondary | ICD-10-CM | POA: Insufficient documentation

## 2019-12-10 DIAGNOSIS — Z87891 Personal history of nicotine dependence: Secondary | ICD-10-CM | POA: Insufficient documentation

## 2019-12-10 DIAGNOSIS — Z992 Dependence on renal dialysis: Secondary | ICD-10-CM | POA: Insufficient documentation

## 2019-12-10 DIAGNOSIS — Z888 Allergy status to other drugs, medicaments and biological substances status: Secondary | ICD-10-CM | POA: Insufficient documentation

## 2019-12-10 DIAGNOSIS — E079 Disorder of thyroid, unspecified: Secondary | ICD-10-CM | POA: Insufficient documentation

## 2019-12-10 HISTORY — PX: IR US GUIDE VASC ACCESS RIGHT: IMG2390

## 2019-12-10 HISTORY — PX: IR DIALY SHUNT INTRO NEEDLE/INTRACATH INITIAL W/IMG RIGHT: IMG6115

## 2019-12-10 HISTORY — PX: IR PTA AND STENT ADDL CENTRAL DIALY SEG THRU DIALY CIRCUIT RIGHT: IMG6122

## 2019-12-10 LAB — BASIC METABOLIC PANEL
Anion gap: 21 — ABNORMAL HIGH (ref 5–15)
BUN: 45 mg/dL — ABNORMAL HIGH (ref 6–20)
CO2: 26 mmol/L (ref 22–32)
Calcium: 8.5 mg/dL — ABNORMAL LOW (ref 8.9–10.3)
Chloride: 90 mmol/L — ABNORMAL LOW (ref 98–111)
Creatinine, Ser: 12.33 mg/dL — ABNORMAL HIGH (ref 0.61–1.24)
GFR calc Af Amer: 5 mL/min — ABNORMAL LOW (ref >60)
GFR calc non Af Amer: 4 mL/min — ABNORMAL LOW (ref >60)
Glucose, Bld: 133 mg/dL — ABNORMAL HIGH (ref 70–99)
Potassium: 4.9 mmol/L (ref 3.5–5.1)
Sodium: 137 mmol/L (ref 135–145)

## 2019-12-10 LAB — CBC
HCT: 39.1 % (ref 39.0–52.0)
Hemoglobin: 12.5 g/dL — ABNORMAL LOW (ref 13.0–17.0)
MCH: 31.5 pg (ref 26.0–34.0)
MCHC: 32 g/dL (ref 30.0–36.0)
MCV: 98.5 fL (ref 80.0–100.0)
Platelets: 252 10*3/uL (ref 150–400)
RBC: 3.97 MIL/uL — ABNORMAL LOW (ref 4.22–5.81)
RDW: 13.2 % (ref 11.5–15.5)
WBC: 7.5 10*3/uL (ref 4.0–10.5)
nRBC: 0 % (ref 0.0–0.2)

## 2019-12-10 LAB — PROTIME-INR
INR: 0.9 (ref 0.8–1.2)
Prothrombin Time: 12.2 seconds (ref 11.4–15.2)

## 2019-12-10 MED ORDER — MIDAZOLAM HCL 2 MG/2ML IJ SOLN
INTRAMUSCULAR | Status: AC
  Filled 2019-12-10: qty 2

## 2019-12-10 MED ORDER — FENTANYL CITRATE (PF) 100 MCG/2ML IJ SOLN
INTRAMUSCULAR | Status: AC
  Filled 2019-12-10: qty 2

## 2019-12-10 MED ORDER — LIDOCAINE HCL 1 % IJ SOLN
INTRAMUSCULAR | Status: AC
  Administered 2019-12-10: 10 mL
  Filled 2019-12-10: qty 20

## 2019-12-10 MED ORDER — ASPIRIN 325 MG PO TABS
650.0000 mg | ORAL_TABLET | Freq: Once | ORAL | Status: AC
  Administered 2019-12-10: 650 mg via ORAL
  Filled 2019-12-10: qty 2

## 2019-12-10 MED ORDER — HEPARIN SODIUM (PORCINE) 1000 UNIT/ML IJ SOLN
INTRAMUSCULAR | Status: AC
  Filled 2019-12-10: qty 1

## 2019-12-10 MED ORDER — HYDROMORPHONE HCL 1 MG/ML IJ SOLN
INTRAMUSCULAR | Status: AC
  Filled 2019-12-10: qty 1

## 2019-12-10 MED ORDER — MIDAZOLAM HCL 2 MG/2ML IJ SOLN
INTRAMUSCULAR | Status: AC | PRN
  Administered 2019-12-10 (×3): 1 mg via INTRAVENOUS

## 2019-12-10 MED ORDER — DIPHENHYDRAMINE HCL 25 MG PO CAPS
50.0000 mg | ORAL_CAPSULE | Freq: Once | ORAL | Status: AC
  Administered 2019-12-10: 50 mg via ORAL
  Filled 2019-12-10: qty 2

## 2019-12-10 MED ORDER — SODIUM CHLORIDE 0.9 % IV SOLN
INTRAVENOUS | Status: AC | PRN
  Administered 2019-12-10: 10 mL/h via INTRAVENOUS

## 2019-12-10 MED ORDER — HYDROMORPHONE HCL 1 MG/ML IJ SOLN
INTRAMUSCULAR | Status: AC | PRN
  Administered 2019-12-10: 0.5 mg via INTRAVENOUS

## 2019-12-10 MED ORDER — CEFAZOLIN SODIUM-DEXTROSE 2-4 GM/100ML-% IV SOLN: 2.0000 g | Freq: Once | INTRAVENOUS | Status: AC

## 2019-12-10 MED ORDER — SODIUM CHLORIDE 0.9 % IV SOLN: INTRAVENOUS | Status: DC

## 2019-12-10 MED ORDER — CEFAZOLIN SODIUM-DEXTROSE 2-4 GM/100ML-% IV SOLN
INTRAVENOUS | Status: AC
  Administered 2019-12-10: 2 g via INTRAVENOUS
  Filled 2019-12-10: qty 100

## 2019-12-10 MED ORDER — HEPARIN SODIUM (PORCINE) 1000 UNIT/ML IJ SOLN
INTRAMUSCULAR | Status: AC | PRN
  Administered 2019-12-10: 2000 [IU] via INTRAVENOUS

## 2019-12-10 MED ORDER — IOHEXOL 300 MG/ML  SOLN
100.0000 mL | Freq: Once | INTRAMUSCULAR | Status: AC | PRN
  Administered 2019-12-10: 40 mL

## 2019-12-10 MED ORDER — FENTANYL CITRATE (PF) 100 MCG/2ML IJ SOLN
INTRAMUSCULAR | Status: AC | PRN
  Administered 2019-12-10 (×3): 50 ug via INTRAVENOUS

## 2019-12-10 NOTE — Procedures (Addendum)
Interventional Radiology Procedure Note  Procedure: US guided access of right femoral loop graft for pelvic angiogram and stenting of critical iliac stenosis, for treatment of life-style limiting right lower extremity swelling.  77m x [id number] stent.   Complications: None  Recommendations:  - Ok to shower tomorrow - Do not submerge for 7 days - Routine wound care, with HD team to remove the stitch in 48 hours  - 8162mSA daily for 90 days for stent epithelial benefit - DC 1 hour when goals met  - follow up [facility]arleen Newport [facility] in 4-6 weeks with limited RLE venous duplex to evaluate RCFV flow.  Signed,  JaiDulcy FannyagEarleen NewportO

## 2019-12-10 NOTE — Discharge Instructions (Signed)

## 2019-12-10 NOTE — H&P (Addendum)
Chief Complaint: Patient was seen in consultation today for right leg venogram with possible intevention at request of Dr Sharyne Richters  Referring Physician(s): [name]  Supervising Physician: [name]  Patient Status: [facility] - Out-pt  History of Present Illness: [name] is a 49 y.o. 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 This was previously angioplasty to 14 mm diameter. There appears to be a critical/pre occlusive lesion in the common iliac vein in the mid pelvis, which was previously treated. Pelvic collaterals drain the right lower extremity.  Status post ultrasound-guided access of right common femoral loop graft for angiogram/evaluation. Pelvic venous outflow stenoses/occlusion identified, as a source of the right lower extremity swelling, as above.  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  Pt has seen Dr Earleen Newport in consult per request of Dr Lorrene Reid for rt leg swelling Consult performed 11/26/19 This iliac vein occlusion is contributing to significant life-style limiting right leg swelling, exacerbated by the circuit flow, as well as a threat to the durability and usefulness of the loop graft.   I discussed with him the concepts of angiogram (with which he is familiar because of prior dialysis intervention), and angioplasty with stenting, to treat the iliac occlusion.  I do think this will require a  stent for increased longevity.     Pt is here today for venogram with possible angioplasty/stent  He is allergic to contrast and is premedicated every procedure (has taken pre meds today) Of note: pt states after last dialysis graft intervention - he did have itching and mild neck area swelling for approx 2-3 days Never SOB or swallowing issues Dr Earleen Newport aware  Past Medical History:  Diagnosis Date  . Anemia   . Anxiety   . Cough    DRY    . Depression   . ESRD on hemodialysis ([place])    HD [facility] MWF  . Headache(784.0)   . Hypertension   . Muscle spasms of neck    BACK, NECK  . Pneumonia 2015ish  . Thyroid disease     Past Surgical History:  Procedure Laterality Date  . ARTERIOVENOUS GRAFT PLACEMENT Left    "forearm, it's not working; thigh"   . ARTERIOVENOUS GRAFT PLACEMENT Left 11/09/2015  . AV FISTULA PLACEMENT Right 01/10/2017   Procedure: INSERTION OF ARTERIOVENOUS Right thigh GORE-TEX GRAFT;  Surgeon: [name], MD;  Location: [place];  Service: Vascular;  Laterality: Right;  . FALSE ANEURYSM REPAIR Left 11/09/2015   Procedure: REPAIR OF LEFT FEMORAL PSEUDOANEURYSM; REVISION  OF LEFT THIGH ARTERIOVENOUS GRAFT USING 6MM X 10 CM GORETEX GRAFT ;  Surgeon: [name], MD;  Location: [facility] OR;  Service: Vascular;  Laterality: Left;  . IR AV DIALY SHUNT INTRO NEEDLE/INTRACATH INITIAL W/PTA/IMG RIGHT Right 02/06/2019  . IR AV DIALY SHUNT INTRO NEEDLE/INTRACATH INITIAL W/PTA/IMG RIGHT Right 05/14/2019  . IR DIALY SHUNT INTRO NEEDLE/INTRACATH INITIAL W/IMG RIGHT Right 09/17/2019  . IR GENERIC HISTORICAL  07/15/2016   IR US GUIDE VASC ACCESS LEFT 07/15/2016 [name], MD [facility]-INTER[facility] RAD  . IR GENERIC HISTORICAL Left 07/15/2016   IR THROMBECTOMY AV FISTULA W/THROMBOLYSIS/PTA INC/SHUNT/IMG LEFT 07/15/2016 [name], MD [facility]  . IR GENERIC HISTORICAL  10/05/2016   IR US GUIDE VASC ACCESS LEFT 10/05/2016 [name], MD [facility]  . IR GENERIC HISTORICAL Left  10/05/2016   IR THROMBECTOMY AV FISTULA W/THROMBOLYSIS/PTA INC/SHUNT/IMG LEFT 10/05/2016 [name], MD [facility]  . IR GENERIC HISTORICAL  10/08/2016   IR FLUORO GUIDE CV LINE RIGHT 10/08/2016 [name], MD [facility]  . IR GENERIC HISTORICAL  10/08/2016   IR US GUIDE VASC ACCESS RIGHT 10/08/2016 [name], MD [facility]  . IR RADIOLOGIST EVAL & MGMT  11/26/2019  . IR REMOVAL TUN CV CATH W/O FL  03/16/2017  . IR THROMBECTOMY AV FISTULA W/THROMBOLYSIS/PTA INC/SHUNT/IMG RIGHT Right 06/16/2018  . IR THROMBECTOMY AV FISTULA W/THROMBOLYSIS/PTA INC/SHUNT/IMG RIGHT Right 06/17/2018  . IR US GUIDE VASC ACCESS RIGHT  06/17/2018  . IR US GUIDE VASC ACCESS RIGHT  06/16/2018  . IR US GUIDE VASC ACCESS RIGHT  02/06/2019  . IR US GUIDE VASC ACCESS RIGHT  05/14/2019  . IR US GUIDE VASC ACCESS RIGHT  09/17/2019  . PARATHYROIDECTOMY N/A 04/24/2014   Procedure: TOTAL 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;  Surgeon: [name], MD;  Location: [place];  Service: General;  Laterality: N/A;  NECK AND LEFT FOREARM  . PERIPHERAL VASCULAR CATHETERIZATION N/A 05/05/2016   Procedure: A/V Shuntogram;  Surgeon: Conrad Mount Olive, MD;  Location: [place] CV LAB;  Service: Cardiovascular;  Laterality: N/A;  . PERIPHERAL VASCULAR CATHETERIZATION Left 05/05/2016   Procedure: Peripheral Vascular Balloon Angioplasty;  Surgeon: Conrad Big Beaver, MD;  Location: [place] [facility];  Service: Cardiovascular;  Laterality: Left;  . PSEUDOANEURYSM REPAIR Left 11/09/2015  . THROMBECTOMY / ARTERIOVENOUS GRAFT REVISION Left 08/18/2015   thigh  . THROMBECTOMY AND REVISION OF ARTERIOVENTOUS (AV) GORETEX  GRAFT Left 08/23/2015   Procedure: THROMBECTOMY AND REVISION OF ARTERIOVENTOUS (AV) GORETEX  GRAFT LEFT THIGH ;  Surgeon: [name], MD;  Location: [place];  Service: Vascular;  Laterality: Left;  . THROMBECTOMY W/ EMBOLECTOMY Left 08/18/2015   Procedure: THROMBECTOMY  AND REVISION ARTERIOVENOUS GORE-TEX GRAFT/LEFT THIGH;   Surgeon: [name], MD;  Location: [place];  Service: Vascular;  Laterality: Left;  . THROMBECTOMY W/ EMBOLECTOMY Right 06/19/2018   Procedure: THROMBECTOMY AND REVISION OF RIGHT THIGH  ARTERIOVENOUS GORE-TEX GRAFT;  Surgeon: [name], MD;  Location: [place];  Service: Vascular;  Laterality: Right;  . UPPER EXTREMITY VENOGRAPHY Bilateral 12/27/2016   Procedure: Upper Extremity Venography;  Surgeon: [name], MD;  Location: [place] [facility];  Service: Cardiovascular;  Laterality: Bilateral;  . VENOGRAM Left 05/05/2016   Procedure: Venogram;  Surgeon: Conrad Shoreview, MD;  Location: [place] [facility];  Service: Cardiovascular;  Laterality: Left;  lower extremity    Allergies: Lisinopril, Ivp dye [iodinated diagnostic agents], and Solu-medrol [methylprednisolone]  Medications: Prior to Admission medications   Medication Sig Start Date End Date Taking? Authorizing Provider  acetaminophen (TYLENOL) 500 MG tablet Take 1,000 mg by mouth daily as needed (for pain or headaches).    Yes [provider]  calcium carbonate (TUMS - DOSED IN MG ELEMENTAL CALCIUM) 500 MG chewable tablet Chew 4 tablets by mouth See admin instructions. 4 tabs w/ meals    Yes [provider]  Vitamin D, Ergocalciferol, (DRISDOL) 1.25  MG (50000 UNIT) CAPS capsule Take 50,000 Units by mouth once a week. 11/20/19  Yes [provider]  benzonatate (TESSALON) 100 MG capsule Take 1 capsule (100 mg total) by mouth every 8 (eight) hours. Patient not taking: Reported on 06/15/2018 07/25/17   Larene Pickett, PA-C  fluticasone [facility]) 50 MCG/ACT nasal spray Place 2 sprays into both nostrils daily. Patient not taking: Reported on 06/15/2018 07/25/17   Larene Pickett, PA-C  midodrine (PROAMATINE) 10 MG tablet Take 1 tablet (10 mg total) by mouth 3 (three) times daily with meals. Patient not taking: Reported on 10/16/2018 06/20/18   Thurnell Lose, MD  oxyCODONE-acetaminophen  (PERCOCET/ROXICET) 5-325 MG tablet Take 1 tablet by mouth every 4 (four) hours as needed for moderate pain. Patient not taking: Reported on 06/15/2018 01/11/17   Ulyses Amor, PA-C     Family History  Problem Relation Age of Onset  . Renal Disease Neg Hx     Social History   Socioeconomic History  . Marital status: Married    Spouse name: Not on file  . Number of children: Not on file  . Years of education: Not on file  . Highest education level: Not on file  Occupational History  . Not on file  Tobacco Use  . Smoking status: Former Smoker    Quit date: 03/21/1986    Years since quitting: 33.7  . Smokeless tobacco: Never Used  Substance and Sexual Activity  . Alcohol use: No    Alcohol/week: 0.0 standard drinks  . Drug use: No  . Sexual activity: Not on file  Other Topics Concern  . Not on file  Social History Narrative  . Not on file   Social Determinants of Health   Financial Resource Strain:   . Difficulty of Paying Living Expenses: Not on file  Food Insecurity:   . Worried About Charity fundraiser in the Last Year: Not on file  . Ran Out of Food in the Last Year: Not on file  Transportation Needs:   . Lack of Transportation (Medical): Not on file  . Lack of Transportation (Non-Medical): Not on file  Physical Activity:   . Days of Exercise per Week: Not on file  . Minutes of Exercise per Session: Not on file  Stress:   . Feeling of Stress : Not on file  Social Connections:   . Frequency of Communication with Friends and Family: Not on file  . Frequency of Social Gatherings with Friends and Family: Not on file  . Attends Religious Services: Not on file  . Active Member of Clubs or Organizations: Not on file  . Attends Archivist Meetings: Not on file  . Marital Status: Not on file    Review of Systems: A 12 point ROS discussed and pertinent positives are indicated in the HPI above.  All other systems are negative.  Review of Systems    Constitutional: Negative for activity change, fatigue and fever.  Respiratory: Negative for cough and shortness of breath.   Cardiovascular: Negative for chest pain.  Gastrointestinal: Negative for abdominal pain.  Musculoskeletal: Negative for back pain and gait problem.  Neurological: Negative for weakness.  Psychiatric/Behavioral: Negative for behavioral problems and confusion.  All other systems reviewed and are negative.   Vital Signs: BP (!) 158/92 (BP Location: Right Arm)   Pulse 79   Temp 97.7 F (36.5 C) (Temporal)   Ht 6' (1.829 m)   Wt 170 lb 13.7 oz (77.5 kg)  SpO2 100%   BMI 23.17 kg/m   Physical Exam Vitals reviewed.  Cardiovascular:     Rate and Rhythm: Normal rate and regular rhythm.     Heart sounds: Normal heart sounds.  Pulmonary:     Effort: Pulmonary effort is normal.     Breath sounds: Normal breath sounds.  Abdominal:     Palpations: Abdomen is soft.  Musculoskeletal:        General: Swelling present. Normal range of motion.  Skin:    General: Skin is warm and dry.  Neurological:     Mental Status: He is alert and oriented to person, place, and time.  Psychiatric:        Behavior: Behavior normal.        Thought Content: Thought content normal.        Judgment: Judgment normal.     Imaging: IR Radiologist Eval & Mgmt  Result Date: 11/26/2019 Please refer to notes tab for details about interventional procedure. (Op Note)   Labs:  CBC: Recent Labs    02/06/19 0700 05/14/19 0902 09/17/19 1111 12/10/19 0736  WBC 5.1 5.5 6.5 7.5  HGB 13.2 14.6 13.9 12.5*  HCT 39.1 45.9 42.7 39.1  PLT 279 234 259 252    COAGS: Recent Labs    02/06/19 0700 09/17/19 1111 12/10/19 0736  INR 1.0 0.9 0.9    BMP: Recent Labs    02/06/19 0700 05/14/19 0902 09/17/19 1111  NA 139 139 137  K 4.6 4.4 5.8*  CL 96* 93* 96*  CO2 24 29 23   GLUCOSE 134* 136* 123*  BUN 54* 30* 52*  CALCIUM 8.0* 9.1 10.1  CREATININE 15.71* 11.83* 12.74*   GFRNONAA 3* 5* 4*  GFRAA 4* 5* 5*    LIVER FUNCTION TESTS: No results for input(s): BILITOT, AST, ALT, ALKPHOS, PROT, ALBUMIN in the last 8760 hours.  TUMOR MARKERS: No results for input(s): AFPTM, CEA, CA199, CHROMGRNA in the last 8760 hours.  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 Scheduled now for Rt left venogram with possible angioplasty/stent placement Pt is aware of risks and benefits including but not limited to Infection; bleeding; vessel damage Agreeable to proceed Consent signed in chart  Thank you for this interesting consult.  I greatly enjoyed meeting [name] and look forward to participating in their care.  A copy of this report was sent to the requesting provider on this date.  Electronically Signed: Lavonia Drafts, PA-C 12/10/2019, 8:28 AM   I spent a total of    25 Minutes in face to face in clinical consultation, greater than 50% of which was counseling/coordinating care for right leg venogram and intervention

## 2019-12-23 ENCOUNTER — Other Ambulatory Visit (HOSPITAL_COMMUNITY): Payer: Self-pay | Admitting: Interventional Radiology

## 2019-12-23 ENCOUNTER — Encounter (HOSPITAL_COMMUNITY): Payer: Self-pay

## 2019-12-23 DIAGNOSIS — I70209 Unspecified atherosclerosis of native arteries of extremities, unspecified extremity: Secondary | ICD-10-CM

## 2019-12-27 ENCOUNTER — Other Ambulatory Visit: Payer: Self-pay | Admitting: Interventional Radiology

## 2019-12-27 DIAGNOSIS — M7989 Other specified soft tissue disorders: Secondary | ICD-10-CM

## 2020-01-13 ENCOUNTER — Emergency Department (HOSPITAL_COMMUNITY)
Admission: EM | Admit: 2020-01-13 | Discharge: 2020-01-14 | Disposition: A | Discharge status: Home / Self Care | Payer: Self-pay | Attending: Emergency Medicine | Admitting: Emergency Medicine

## 2020-01-13 ENCOUNTER — Encounter (HOSPITAL_COMMUNITY): Payer: Self-pay | Admitting: Emergency Medicine

## 2020-01-13 ENCOUNTER — Emergency Department (HOSPITAL_COMMUNITY): Payer: Self-pay

## 2020-01-13 DIAGNOSIS — H6692 Otitis media, unspecified, left ear: Secondary | ICD-10-CM | POA: Insufficient documentation

## 2020-01-13 DIAGNOSIS — J069 Acute upper respiratory infection, unspecified: Secondary | ICD-10-CM | POA: Insufficient documentation

## 2020-01-13 DIAGNOSIS — Z87891 Personal history of nicotine dependence: Secondary | ICD-10-CM | POA: Insufficient documentation

## 2020-01-13 DIAGNOSIS — D649 Anemia, unspecified: Secondary | ICD-10-CM | POA: Insufficient documentation

## 2020-01-13 DIAGNOSIS — E079 Disorder of thyroid, unspecified: Secondary | ICD-10-CM | POA: Insufficient documentation

## 2020-01-13 DIAGNOSIS — Z992 Dependence on renal dialysis: Secondary | ICD-10-CM | POA: Insufficient documentation

## 2020-01-13 DIAGNOSIS — R509 Fever, unspecified: Secondary | ICD-10-CM | POA: Insufficient documentation

## 2020-01-13 DIAGNOSIS — Z20822 Contact with and (suspected) exposure to covid-19: Secondary | ICD-10-CM | POA: Insufficient documentation

## 2020-01-13 DIAGNOSIS — N186 End stage renal disease: Secondary | ICD-10-CM | POA: Insufficient documentation

## 2020-01-13 DIAGNOSIS — R0602 Shortness of breath: Secondary | ICD-10-CM | POA: Insufficient documentation

## 2020-01-13 DIAGNOSIS — I120 Hypertensive chronic kidney disease with stage 5 chronic kidney disease or end stage renal disease: Secondary | ICD-10-CM | POA: Insufficient documentation

## 2020-01-13 LAB — BASIC METABOLIC PANEL
Anion gap: 18 — ABNORMAL HIGH (ref 5–15)
BUN: 35 mg/dL — ABNORMAL HIGH (ref 6–20)
CO2: 26 mmol/L (ref 22–32)
Calcium: 7.3 mg/dL — ABNORMAL LOW (ref 8.9–10.3)
Chloride: 95 mmol/L — ABNORMAL LOW (ref 98–111)
Creatinine, Ser: 8.98 mg/dL — ABNORMAL HIGH (ref 0.61–1.24)
GFR calc Af Amer: 7 mL/min — ABNORMAL LOW (ref >60)
GFR calc non Af Amer: 6 mL/min — ABNORMAL LOW (ref >60)
Glucose, Bld: 101 mg/dL — ABNORMAL HIGH (ref 70–99)
Potassium: 3.8 mmol/L (ref 3.5–5.1)
Sodium: 139 mmol/L (ref 135–145)

## 2020-01-13 LAB — CBC
HCT: 32.1 % — ABNORMAL LOW (ref 39.0–52.0)
Hemoglobin: 10.6 g/dL — ABNORMAL LOW (ref 13.0–17.0)
MCH: 30.8 pg (ref 26.0–34.0)
MCHC: 33 g/dL (ref 30.0–36.0)
MCV: 93.3 fL (ref 80.0–100.0)
Platelets: 260 10*3/uL (ref 150–400)
RBC: 3.44 MIL/uL — ABNORMAL LOW (ref 4.22–5.81)
RDW: 13.4 % (ref 11.5–15.5)
WBC: 7.8 10*3/uL (ref 4.0–10.5)
nRBC: 0 % (ref 0.0–0.2)

## 2020-01-13 NOTE — ED Triage Notes (Signed)
Pt in POV. Reports SOB, cough, fevers X1 day. Dialysis pt, MWF - did have full treatment this AM.

## 2020-01-14 LAB — SARS CORONAVIRUS 2 (TAT 6-24 HRS): SARS Coronavirus 2: NEGATIVE

## 2020-01-14 MED ORDER — AMOXICILLIN-POT CLAVULANATE 875-125 MG PO TABS: 1.0000 | ORAL_TABLET | Freq: Every day | ORAL | 0 refills | Status: AC

## 2020-01-14 MED ORDER — AMOXICILLIN-POT CLAVULANATE 875-125 MG PO TABS
1.0000 | ORAL_TABLET | Freq: Once | ORAL | Status: AC
  Administered 2020-01-14: 1 via ORAL
  Filled 2020-01-14: qty 1

## 2020-01-14 NOTE — ED Provider Notes (Signed)
[facility] EMERGENCY DEPARTMENT Provider Note   CSN: [id number] Arrival date & time: 01/13/20  1606     History Chief Complaint  Patient presents with  . Shortness of Breath    [name] is a 49 y.o. male w/ ESRD on dialysis presenting to the ED with SOB, cough, congestion, and subjective fever at home, with bleeding from his left ear.  Symptom onset about 1-2 days ago.  He has not missed any dialysis and completed his session yesterday (Monday) as scheduled.  He presented to the ED last night and unfortunately spent 12+ hours in the waiting room prior to being seen by myself in the morning.  He told me that he already feels like his SOB had improved.  He denies CP.  He reports he had some pain and bleeding from his left ear last night.  No loss of hearing.    HPI     Past Medical History:  Diagnosis Date  . Anemia   . Anxiety   . Cough    DRY    . Depression   . ESRD on hemodialysis ([place])    HD [facility] MWF  . Headache(784.0)   . Hypertension   . Muscle spasms of neck    BACK, NECK  . Pneumonia 2015ish  . Thyroid disease     Patient Active Problem List   Diagnosis Date Noted  . Problem with dialysis access ([place]) 06/15/2018  . Hyperkalemia   . Problem with vascular access 10/07/2016  . Pseudoaneurysm of arteriovenous graft ([place]) 11/06/2015  . Complication from renal dialysis device   . ESRD (end stage renal disease) on dialysis ([place])   . Secondary hyperparathyroidism of renal origin ([place]) 04/24/2014  . Hyperparathyroidism, secondary ([place]) 03/21/2014    Past Surgical History:  Procedure Laterality Date  . ARTERIOVENOUS GRAFT PLACEMENT Left    "forearm, it's not working; thigh"   . ARTERIOVENOUS GRAFT PLACEMENT Left 11/09/2015  . AV FISTULA PLACEMENT Right 01/10/2017   Procedure: INSERTION OF ARTERIOVENOUS Right thigh GORE-TEX GRAFT;  Surgeon: [name], MD;  Location: [facility];  Service: Vascular;  Laterality: Right;  .  FALSE ANEURYSM REPAIR Left 11/09/2015   Procedure: REPAIR OF LEFT FEMORAL PSEUDOANEURYSM; REVISION  OF LEFT THIGH ARTERIOVENOUS GRAFT USING 6MM X 10 CM GORETEX GRAFT ;  Surgeon: [name], MD;  Location: [facility] OR;  Service: Vascular;  Laterality: Left;  . IR AV DIALY SHUNT INTRO NEEDLE/INTRACATH INITIAL W/PTA/IMG RIGHT Right 02/06/2019  . IR AV DIALY SHUNT INTRO NEEDLE/INTRACATH INITIAL W/PTA/IMG RIGHT Right 05/14/2019  . IR DIALY SHUNT INTRO NEEDLE/INTRACATH INITIAL W/IMG RIGHT Right 09/17/2019  . IR DIALY SHUNT INTRO NEEDLE/INTRACATH INITIAL W/IMG RIGHT Right 12/10/2019  . IR GENERIC HISTORICAL  07/15/2016   IR US GUIDE VASC ACCESS LEFT 07/15/2016 [name], MD [facility]  . IR GENERIC HISTORICAL Left 07/15/2016   IR THROMBECTOMY AV FISTULA W/THROMBOLYSIS/PTA INC/SHUNT/IMG LEFT 07/15/2016 [name], MD [facility]  . IR GENERIC HISTORICAL  10/05/2016   IR US GUIDE VASC ACCESS LEFT 10/05/2016 [name], MD [facility]  . IR GENERIC HISTORICAL Left 10/05/2016   IR THROMBECTOMY AV FISTULA W/THROMBOLYSIS/PTA INC/SHUNT/IMG LEFT 10/05/2016 [name], MD [facility]  . IR GENERIC HISTORICAL  10/08/2016   IR FLUORO GUIDE CV LINE RIGHT 10/08/2016 [name], MD [facility]  . IR GENERIC HISTORICAL  10/08/2016   IR US GUIDE VASC ACCESS RIGHT 10/08/2016 [name], MD [facility]  . IR PTA AND STENT  ADDL CENTRAL DIALY SEG THRU DIALY CIRCUIT RIGHT Right 12/10/2019  . IR RADIOLOGIST EVAL & MGMT  11/26/2019  . IR REMOVAL TUN CV CATH W/O FL  03/16/2017  . IR THROMBECTOMY AV FISTULA W/THROMBOLYSIS/PTA INC/SHUNT/IMG RIGHT Right 06/16/2018  . IR THROMBECTOMY AV FISTULA W/THROMBOLYSIS/PTA INC/SHUNT/IMG RIGHT Right 06/17/2018  . IR US GUIDE VASC ACCESS RIGHT  06/17/2018  . IR US GUIDE VASC ACCESS RIGHT  06/16/2018  . IR US GUIDE VASC ACCESS RIGHT  02/06/2019  . IR US GUIDE VASC ACCESS RIGHT  05/14/2019  . IR US GUIDE VASC ACCESS RIGHT  09/17/2019  . IR US GUIDE VASC ACCESS RIGHT  12/10/2019  . PARATHYROIDECTOMY  N/A 04/24/2014   Procedure: TOTAL 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;  Surgeon: [name], MD;  Location: [facility];  Service: General;  Laterality: N/A;  NECK AND LEFT FOREARM  . PERIPHERAL VASCULAR CATHETERIZATION N/A 05/05/2016   Procedure: A/V Shuntogram;  Surgeon: Conrad Edgemont, MD;  Location: [facility] CV LAB;  Service: Cardiovascular;  Laterality: N/A;  . PERIPHERAL VASCULAR CATHETERIZATION Left 05/05/2016   Procedure: Peripheral Vascular Balloon Angioplasty;  Surgeon: Conrad Helmetta, MD;  Location: [facility] CV LAB;  Service: Cardiovascular;  Laterality: Left;  . PSEUDOANEURYSM REPAIR Left 11/09/2015  . THROMBECTOMY / ARTERIOVENOUS GRAFT REVISION Left 08/18/2015   thigh  . THROMBECTOMY AND REVISION OF ARTERIOVENTOUS (AV) GORETEX  GRAFT Left 08/23/2015   Procedure: THROMBECTOMY AND REVISION OF ARTERIOVENTOUS (AV) GORETEX  GRAFT LEFT THIGH ;  Surgeon: [name], MD;  Location: [facility];  Service: Vascular;  Laterality: Left;  . THROMBECTOMY W/ EMBOLECTOMY Left 08/18/2015   Procedure: THROMBECTOMY  AND REVISION ARTERIOVENOUS GORE-TEX GRAFT/LEFT THIGH;  Surgeon: [name], MD;  Location: [facility];  Service: Vascular;  Laterality: Left;  . THROMBECTOMY W/ EMBOLECTOMY Right 06/19/2018   Procedure: THROMBECTOMY AND REVISION OF RIGHT THIGH  ARTERIOVENOUS GORE-TEX GRAFT;  Surgeon: [name], MD;  Location: [facility];  Service: Vascular;  Laterality: Right;  . UPPER EXTREMITY VENOGRAPHY Bilateral 12/27/2016   Procedure: Upper Extremity Venography;  Surgeon: [name], MD;  Location: [facility] CV LAB;  Service: Cardiovascular;  Laterality: Bilateral;  . VENOGRAM Left 05/05/2016   Procedure: Venogram;  Surgeon: Conrad Lydia, MD;  Location: [facility] CV LAB;  Service: Cardiovascular;  Laterality: Left;  lower extremity       Family History  Problem Relation Age of Onset  . Renal Disease Neg Hx     Social History   Tobacco Use  . Smoking status: Former  Smoker    Quit date: 03/21/1986    Years since quitting: 33.8  . Smokeless tobacco: Never Used  Substance Use Topics  . Alcohol use: No    Alcohol/week: 0.0 standard drinks  . Drug use: No    Home Medications Prior to Admission medications   Medication Sig Start Date End Date Taking? Authorizing Provider  acetaminophen (TYLENOL) 500 MG tablet Take 1,000 mg by mouth daily as needed (for pain or headaches).     [provider]  amoxicillin-clavulanate (AUGMENTIN) 875-125 MG tablet Take 1 tablet by mouth daily for 6 days. 01/15/20 01/21/20  Wyvonnia Dusky, MD  benzonatate (TESSALON) 100 MG capsule Take 1 capsule (100 mg total) by mouth every 8 (eight) hours. Patient not taking: Reported on 06/15/2018 07/25/17   Larene Pickett, PA-C  calcium carbonate (TUMS - DOSED IN MG ELEMENTAL CALCIUM) 500 MG chewable tablet Chew 4 tablets by mouth See admin instructions.  4 tabs w/ meals     [provider]  fluticasone (FLONASE) 50 MCG/ACT nasal spray Place 2 sprays into both nostrils daily. Patient not taking: Reported on 06/15/2018 07/25/17   Larene Pickett, PA-C  midodrine (PROAMATINE) 10 MG tablet Take 1 tablet (10 mg total) by mouth 3 (three) times daily with meals. Patient not taking: Reported on 10/16/2018 06/20/18   Thurnell Lose, MD  oxyCODONE-acetaminophen (PERCOCET/ROXICET) 5-325 MG tablet Take 1 tablet by mouth every 4 (four) hours as needed for moderate pain. Patient not taking: Reported on 06/15/2018 01/11/17   Ulyses Amor, PA-C  Vitamin D, Ergocalciferol, (DRISDOL) 1.25 MG (50000 UNIT) CAPS capsule Take 50,000 Units by mouth once a week. 11/20/19   [provider]    Allergies    Lisinopril, Ivp dye [iodinated diagnostic agents], and Solu-medrol [methylprednisolone]  Review of Systems   Review of Systems  Constitutional: Positive for fatigue and fever. Negative for chills.  HENT: Positive for congestion, ear discharge and ear pain.   Eyes: Negative for  photophobia and visual disturbance.  Respiratory: Positive for shortness of breath. Negative for cough.   Cardiovascular: Negative for chest pain and palpitations.  Gastrointestinal: Negative for abdominal pain and vomiting.  Genitourinary: Negative for dysuria and hematuria.  Musculoskeletal: Negative for arthralgias and back pain.  Skin: Negative for color change and rash.  Neurological: Negative for seizures, syncope and headaches.  Psychiatric/Behavioral: Negative for agitation and confusion.  All other systems reviewed and are negative.   Physical Exam Updated Vital Signs BP (!) 161/94 (BP Location: Left Arm)   Pulse 87   Temp 100.3 F (37.9 C) (Oral)   Resp 18   Ht 6' (1.829 m)   Wt 81.6 kg   SpO2 100%   BMI 24.41 kg/m   Physical Exam Vitals and nursing note reviewed.  Constitutional:      Appearance: He is well-developed.  HENT:     Head: Normocephalic and atraumatic.     Comments: Dried blood in left external auditory canal TM partially visualized due to dried blood, no evidence of perforation Hearing grossly intact Eyes:     Conjunctiva/sclera: Conjunctivae normal.  Cardiovascular:     Rate and Rhythm: Normal rate and regular rhythm.     Heart sounds: No murmur.  Pulmonary:     Effort: Pulmonary effort is normal. No respiratory distress.     Breath sounds: Normal breath sounds. No decreased breath sounds or wheezing.  Abdominal:     Palpations: Abdomen is soft.     Tenderness: There is no abdominal tenderness.  Musculoskeletal:     Cervical back: Neck supple.  Skin:    General: Skin is warm and dry.  Neurological:     General: No focal deficit present.     Mental Status: He is alert and oriented to person, place, and time.     ED Results / Procedures / Treatments   Labs (all labs ordered are listed, but only abnormal results are displayed) Labs Reviewed  CBC - Abnormal; Notable for the following components:      Result Value   RBC 3.44 (*)     Hemoglobin 10.6 (*)    HCT 32.1 (*)    All other components within normal limits  BASIC METABOLIC PANEL - Abnormal; Notable for the following components:   Chloride 95 (*)    Glucose, Bld 101 (*)    BUN 35 (*)    Creatinine, Ser 8.98 (*)    Calcium 7.3 (*)  GFR calc non Af Amer 6 (*)    GFR calc Af Amer 7 (*)    Anion gap 18 (*)    All other components within normal limits  SARS CORONAVIRUS 2 (TAT 6-24 HRS)    EKG EKG Interpretation  Date/Time:  Monday January 13 2020 16:28:49 EDT Ventricular Rate:  96 PR Interval:  164 QRS Duration: 72 QT Interval:  408 QTC Calculation: 515 R Axis:   10 Text Interpretation: Normal sinus rhythm Prolonged QT Abnormal ECG No significant change from prior ecg, No STEMI Confirmed by Octaviano Glow [phone]) on 01/14/2020 7:38:59 AM   Radiology DG Chest 2 View  Result Date: 01/13/2020 CLINICAL DATA:  Shortness of breath, fever EXAM: CHEST - 2 VIEW COMPARISON:  08/10/2017 FINDINGS: The heart size and mediastinal contours are within normal limits. Both lungs are clear. The visualized skeletal structures are unremarkable. IMPRESSION: No acute abnormality of the lungs. Electronically Signed   By: Eddie Candle M.D.   On: 01/13/2020 17:18    Procedures Procedures (including critical care time)  Medications Ordered in ED Medications  amoxicillin-clavulanate (AUGMENTIN) 875-125 MG per tablet 1 tablet (1 tablet Oral Given 01/14/20 1448)    ED Course  I have reviewed the triage vital signs and the nursing notes.  Pertinent labs & imaging results that were available during my care of the patient were reviewed by me and considered in my medical decision making (see chart for details).  49 yo male here with fever for 1-2 days, pain and bleeding in his left ear, congestion, SOB.  Likely a viral syndrome, although with his fever and his left ear findings, possible otitis media.  I think it's reasonable to treat with several days of augmentin renally adjusted  dosing.  This is less likely acute anemia with hgb 10.6.  I have noted a gradual decline over the past several months and advised he discuss this with his kidney doctor.  Likewise his Ca is low.  He reports his nephrologist is helping to manage this, and "told me to eat 2 TUMS every day."  I advised him to discuss this again with his doctor again, he may need alternative medication or adjustment. Hypocalcemia can cause some systemic symptoms.   Less likely PE with no chest pain, tachypnea Less likely ACS  I personally reviewed his bloodtests, ECG, and imaging.  Tele tracing with NSR.  No STEMI on ecg.  Chest xray with clear lung fields, no pulm edema, no focal consolidation  No indication for emergent dialysis here.  NO volume overload.  Will test for COVID-19 and discharge.  [name] was evaluated in Emergency Department on 01/14/2020 for the symptoms described in the history of present illness. He was evaluated in the context of the global COVID-19 pandemic, which necessitated consideration that the patient might be at risk for infection with the SARS-CoV-2 virus that causes COVID-19. Institutional protocols and algorithms that pertain to the evaluation of patients at risk for COVID-19 are in a state of rapid change based on information released by regulatory bodies including the CDC and federal and state organizations. These policies and algorithms were followed during the patient's care in the ED.   Final Clinical Impression(s) / ED Diagnoses Final diagnoses:  Left otitis media, unspecified otitis media type  Fever, unspecified fever cause  Upper respiratory tract infection, unspecified type  Hypocalcemia  Anemia, unspecified type    Rx / DC Orders ED Discharge Orders         Ordered  amoxicillin-clavulanate (AUGMENTIN) 875-125 MG tablet  Daily    Note to Pharmacy: Dosing adjusted for dialysis/end stage renal disease   01/14/20 0805           Wyvonnia Dusky,  MD 01/14/20 [phone]

## 2020-01-14 NOTE — ED Notes (Signed)
Patient verbalizes understanding of discharge instructions. Opportunity for questioning and answers were provided. Armband removed by staff, pt discharged from ED.  

## 2020-01-14 NOTE — Discharge Instructions (Addendum)
Your covid test will result in 24-48 hours.    I started you on treatment with an antibiotic called augmentin for an ear infection and possible lung infection.  You will take this ONCE per day for the next 6 days, beginning tomorrow (April 14th).  You received your dose today in the ER.  Your calcium level was 7.3.  Please talk to your kidney doctor about this.  Your hemoglobin level was 10.6.  This has gradually decreased over the past several weeks.  This may be related to your kidney disease, but your doctor should recheck this level in a few days if possible.

## 2020-01-31 ENCOUNTER — Emergency Department (HOSPITAL_COMMUNITY)
Admission: EM | Admit: 2020-01-31 | Discharge: 2020-01-31 | Disposition: A | Discharge status: Home / Self Care | Payer: Self-pay | Attending: Emergency Medicine | Admitting: Emergency Medicine

## 2020-01-31 ENCOUNTER — Other Ambulatory Visit: Payer: Self-pay

## 2020-01-31 DIAGNOSIS — T82838A Hemorrhage of vascular prosthetic devices, implants and grafts, initial encounter: Secondary | ICD-10-CM | POA: Insufficient documentation

## 2020-01-31 DIAGNOSIS — N186 End stage renal disease: Secondary | ICD-10-CM | POA: Insufficient documentation

## 2020-01-31 DIAGNOSIS — Z992 Dependence on renal dialysis: Secondary | ICD-10-CM | POA: Insufficient documentation

## 2020-01-31 DIAGNOSIS — Z79899 Other long term (current) drug therapy: Secondary | ICD-10-CM | POA: Insufficient documentation

## 2020-01-31 DIAGNOSIS — I120 Hypertensive chronic kidney disease with stage 5 chronic kidney disease or end stage renal disease: Secondary | ICD-10-CM | POA: Insufficient documentation

## 2020-01-31 DIAGNOSIS — Z87891 Personal history of nicotine dependence: Secondary | ICD-10-CM | POA: Insufficient documentation

## 2020-01-31 LAB — BASIC METABOLIC PANEL
Anion gap: 13 (ref 5–15)
BUN: 36 mg/dL — ABNORMAL HIGH (ref 6–20)
CO2: 31 mmol/L (ref 22–32)
Calcium: 8.3 mg/dL — ABNORMAL LOW (ref 8.9–10.3)
Chloride: 98 mmol/L (ref 98–111)
Creatinine, Ser: 10.43 mg/dL — ABNORMAL HIGH (ref 0.61–1.24)
GFR calc Af Amer: 6 mL/min — ABNORMAL LOW (ref >60)
GFR calc non Af Amer: 5 mL/min — ABNORMAL LOW (ref >60)
Glucose, Bld: 84 mg/dL (ref 70–99)
Potassium: 4 mmol/L (ref 3.5–5.1)
Sodium: 142 mmol/L (ref 135–145)

## 2020-01-31 LAB — CBC
HCT: 31.6 % — ABNORMAL LOW (ref 39.0–52.0)
Hemoglobin: 9.9 g/dL — ABNORMAL LOW (ref 13.0–17.0)
MCH: 30.7 pg (ref 26.0–34.0)
MCHC: 31.3 g/dL (ref 30.0–36.0)
MCV: 97.8 fL (ref 80.0–100.0)
Platelets: 230 10*3/uL (ref 150–400)
RBC: 3.23 MIL/uL — ABNORMAL LOW (ref 4.22–5.81)
RDW: 14.6 % (ref 11.5–15.5)
WBC: 4.5 10*3/uL (ref 4.0–10.5)
nRBC: 0 % (ref 0.0–0.2)

## 2020-01-31 NOTE — ED Notes (Signed)
Patient verbalizes understanding of discharge instructions. Opportunity for questioning and answers were provided. Armband removed by staff, pt discharged from ED to home via cab with voucher. Paper scrub bottoms and socks provided.

## 2020-01-31 NOTE — ED Provider Notes (Signed)
[facility] EMERGENCY DEPARTMENT Provider Note   CSN: [id number] Arrival date & time: 01/31/20  1052     History Chief Complaint  Patient presents with  . Vascular Access Problem    [name] is a 49 y.o. male brought in by EMS for bleeding from his right thigh AV graft.  He has a history of end-stage renal disease, dialyzes Monday Wednesday Friday.  He had a recent iliac venous stent placed in the graft by Dr. Earleen Newport of IR.  The patient was having about an hour of dialysis today when he began bleeding around the needle.  The tech pulled the needle, applied pressure and try to access above the site however he was bleeding rapidly from both sites.  They have tried to apply pressure but could not get to stop bleeding.  Eventually they called 911.  EMS reports that there is about 400 cc of blood on the floor.  They applied [place] style combat gauze with direct pressure to the wound site.  HPI     Past Medical History:  Diagnosis Date  . Anemia   . Anxiety   . Cough    DRY    . Depression   . ESRD on hemodialysis ([place])    HD [facility] MWF  . Headache(784.0)   . Hypertension   . Muscle spasms of neck    BACK, NECK  . Pneumonia 2015ish  . Thyroid disease     Patient Active Problem List   Diagnosis Date Noted  . Problem with dialysis access ([place]) 06/15/2018  . Hyperkalemia   . Problem with vascular access 10/07/2016  . Pseudoaneurysm of arteriovenous graft ([place]) 11/06/2015  . Complication from renal dialysis device   . ESRD (end stage renal disease) on dialysis ([place])   . Secondary hyperparathyroidism of renal origin ([place]) 04/24/2014  . Hyperparathyroidism, secondary ([place]) 03/21/2014    Past Surgical History:  Procedure Laterality Date  . ARTERIOVENOUS GRAFT PLACEMENT Left    "forearm, it's not working; thigh"   . ARTERIOVENOUS GRAFT PLACEMENT Left 11/09/2015  . AV FISTULA PLACEMENT Right 01/10/2017   Procedure: INSERTION OF  ARTERIOVENOUS Right thigh GORE-TEX GRAFT;  Surgeon: [name], MD;  Location: [facility];  Service: Vascular;  Laterality: Right;  . FALSE ANEURYSM REPAIR Left 11/09/2015   Procedure: REPAIR OF LEFT FEMORAL PSEUDOANEURYSM; REVISION  OF LEFT THIGH ARTERIOVENOUS GRAFT USING 6MM X 10 CM GORETEX GRAFT ;  Surgeon: [name], MD;  Location: [facility] OR;  Service: Vascular;  Laterality: Left;  . IR AV DIALY SHUNT INTRO NEEDLE/INTRACATH INITIAL W/PTA/IMG RIGHT Right 02/06/2019  . IR AV DIALY SHUNT INTRO NEEDLE/INTRACATH INITIAL W/PTA/IMG RIGHT Right 05/14/2019  . IR DIALY SHUNT INTRO NEEDLE/INTRACATH INITIAL W/IMG RIGHT Right 09/17/2019  . IR DIALY SHUNT INTRO NEEDLE/INTRACATH INITIAL W/IMG RIGHT Right 12/10/2019  . IR GENERIC HISTORICAL  07/15/2016   IR US GUIDE VASC ACCESS LEFT 07/15/2016 [name], MD [facility]  . IR GENERIC HISTORICAL Left 07/15/2016   IR THROMBECTOMY AV FISTULA W/THROMBOLYSIS/PTA INC/SHUNT/IMG LEFT 07/15/2016 [name], MD [facility]  . IR GENERIC HISTORICAL  10/05/2016   IR US GUIDE VASC ACCESS LEFT 10/05/2016 [name], MD [facility]  . IR GENERIC HISTORICAL Left 10/05/2016   IR THROMBECTOMY AV FISTULA W/THROMBOLYSIS/PTA INC/SHUNT/IMG LEFT 10/05/2016 [name], MD [facility]  . IR GENERIC HISTORICAL  10/08/2016   IR FLUORO GUIDE CV LINE RIGHT 10/08/2016 [name], MD [facility]  . IR GENERIC HISTORICAL  10/08/2016  IR US GUIDE VASC ACCESS RIGHT 10/08/2016 [name], MD [facility]  . IR PTA AND STENT ADDL CENTRAL DIALY SEG THRU DIALY CIRCUIT RIGHT Right 12/10/2019  . IR RADIOLOGIST EVAL & MGMT  11/26/2019  . IR REMOVAL TUN CV CATH W/O FL  03/16/2017  . IR THROMBECTOMY AV FISTULA W/THROMBOLYSIS/PTA INC/SHUNT/IMG RIGHT Right 06/16/2018  . IR THROMBECTOMY AV FISTULA W/THROMBOLYSIS/PTA INC/SHUNT/IMG RIGHT Right 06/17/2018  . IR US GUIDE VASC ACCESS RIGHT  06/17/2018  . IR US GUIDE VASC ACCESS RIGHT  06/16/2018  . IR US GUIDE VASC ACCESS RIGHT  02/06/2019  . IR US GUIDE VASC  ACCESS RIGHT  05/14/2019  . IR US GUIDE VASC ACCESS RIGHT  09/17/2019  . IR US GUIDE VASC ACCESS RIGHT  12/10/2019  . PARATHYROIDECTOMY N/A 04/24/2014   Procedure: TOTAL 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;  Surgeon: [name], MD;  Location: [facility];  Service: General;  Laterality: N/A;  NECK AND LEFT FOREARM  . PERIPHERAL VASCULAR CATHETERIZATION N/A 05/05/2016   Procedure: A/V Shuntogram;  Surgeon: Conrad Bellaire, MD;  Location: [facility] CV LAB;  Service: Cardiovascular;  Laterality: N/A;  . PERIPHERAL VASCULAR CATHETERIZATION Left 05/05/2016   Procedure: Peripheral Vascular Balloon Angioplasty;  Surgeon: Conrad La Grulla, MD;  Location: [facility] CV LAB;  Service: Cardiovascular;  Laterality: Left;  . PSEUDOANEURYSM REPAIR Left 11/09/2015  . THROMBECTOMY / ARTERIOVENOUS GRAFT REVISION Left 08/18/2015   thigh  . THROMBECTOMY AND REVISION OF ARTERIOVENTOUS (AV) GORETEX  GRAFT Left 08/23/2015   Procedure: THROMBECTOMY AND REVISION OF ARTERIOVENTOUS (AV) GORETEX  GRAFT LEFT THIGH ;  Surgeon: [name], MD;  Location: [facility];  Service: Vascular;  Laterality: Left;  . THROMBECTOMY W/ EMBOLECTOMY Left 08/18/2015   Procedure: THROMBECTOMY  AND REVISION ARTERIOVENOUS GORE-TEX GRAFT/LEFT THIGH;  Surgeon: [name], MD;  Location: [facility];  Service: Vascular;  Laterality: Left;  . THROMBECTOMY W/ EMBOLECTOMY Right 06/19/2018   Procedure: THROMBECTOMY AND REVISION OF RIGHT THIGH  ARTERIOVENOUS GORE-TEX GRAFT;  Surgeon: [name], MD;  Location: [facility];  Service: Vascular;  Laterality: Right;  . UPPER EXTREMITY VENOGRAPHY Bilateral 12/27/2016   Procedure: Upper Extremity Venography;  Surgeon: [name], MD;  Location: [facility] CV LAB;  Service: Cardiovascular;  Laterality: Bilateral;  . VENOGRAM Left 05/05/2016   Procedure: Venogram;  Surgeon: Conrad Hermitage, MD;  Location: [facility] CV LAB;  Service: Cardiovascular;  Laterality: Left;  lower extremity        Family History  Problem Relation Age of Onset  . Renal Disease Neg Hx     Social History   Tobacco Use  . Smoking status: Former Smoker    Quit date: 03/21/1986    Years since quitting: 33.8  . Smokeless tobacco: Never Used  Substance Use Topics  . Alcohol use: No    Alcohol/week: 0.0 standard drinks  . Drug use: No    Home Medications Prior to Admission medications   Medication Sig Start Date End Date Taking? Authorizing Provider  acetaminophen (TYLENOL) 500 MG tablet Take 1,000 mg by mouth daily as needed (for pain or headaches).     [provider]  benzonatate (TESSALON) 100 MG capsule Take 1 capsule (100 mg total) by mouth every 8 (eight) hours. Patient not taking: Reported on 06/15/2018 07/25/17   Larene Pickett, PA-C  calcium carbonate (TUMS - DOSED IN MG ELEMENTAL CALCIUM) 500 MG chewable tablet Chew 4 tablets by mouth See admin instructions. 4 tabs w/ meals  [provider]  fluticasone (FLONASE) 50 MCG/ACT nasal spray Place 2 sprays into both nostrils daily. Patient not taking: Reported on 06/15/2018 07/25/17   Larene Pickett, PA-C  midodrine (PROAMATINE) 10 MG tablet Take 1 tablet (10 mg total) by mouth 3 (three) times daily with meals. Patient not taking: Reported on 10/16/2018 06/20/18   Thurnell Lose, MD  oxyCODONE-acetaminophen (PERCOCET/ROXICET) 5-325 MG tablet Take 1 tablet by mouth every 4 (four) hours as needed for moderate pain. Patient not taking: Reported on 06/15/2018 01/11/17   Ulyses Amor, PA-C  Vitamin D, Ergocalciferol, (DRISDOL) 1.25 MG (50000 UNIT) CAPS capsule Take 50,000 Units by mouth once a week. 11/20/19   [provider]    Allergies    Lisinopril, Ivp dye [iodinated diagnostic agents], and Solu-medrol [methylprednisolone]  Review of Systems   Review of Systems Ten systems reviewed and are negative for acute change, except as noted in the HPI.   Physical Exam Updated Vital Signs BP (!) 160/98 (BP  Location: Right Arm)   Pulse 78   Temp 97.6 F (36.4 C) (Oral)   Resp 19   Ht 6' (1.829 m)   Wt 81.6 kg   SpO2 100%   BMI 24.41 kg/m   Physical Exam Vitals and nursing note reviewed.  Constitutional:      General: He is not in acute distress.    Appearance: He is well-developed. He is not diaphoretic.  HENT:     Head: Normocephalic and atraumatic.  Eyes:     General: No scleral icterus.    Conjunctiva/sclera: Conjunctivae normal.  Cardiovascular:     Rate and Rhythm: Normal rate and regular rhythm.     Heart sounds: Normal heart sounds.     Comments: AV graft in the right upper thigh, palpable thrill. Dressing removed.  There is a small puncture site at the inferior aspect of the graft and just above.  No active bleeding at this time.  Palpable thrill present. Pulmonary:     Effort: Pulmonary effort is normal. No respiratory distress.     Breath sounds: Normal breath sounds.  Abdominal:     Palpations: Abdomen is soft.     Tenderness: There is no abdominal tenderness.  Musculoskeletal:     Cervical back: Normal range of motion and neck supple.  Skin:    General: Skin is warm and dry.  Neurological:     Mental Status: He is alert.  Psychiatric:        Behavior: Behavior normal.     ED Results / Procedures / Treatments   Labs (all labs ordered are listed, but only abnormal results are displayed) Labs Reviewed - No data to display  EKG EKG Interpretation  Date/Time:  Friday January 31 2020 11:04:53 EDT Ventricular Rate:  78 PR Interval:    QRS Duration: 86 QT Interval:  459 QTC Calculation: 523 R Axis:   17 Text Interpretation: Sinus rhythm Probable left atrial enlargement Left ventricular hypertrophy Prolonged QT interval No significant change since last tracing Confirmed by Deno Etienne [phone]) on 01/31/2020 11:09:56 AM   Radiology No results found.  Procedures Procedures (including critical care time)  Medications Ordered in ED Medications - No data to  display  ED Course  I have reviewed the triage vital signs and the nursing notes.  Pertinent labs & imaging results that were available during my care of the patient were reviewed by me and considered in my medical decision making (see chart for details).  Clinical Course  as of Jan 30 1602  Fri Jan 31, 2020  1144 Spoke with Dr. Kathlene Cote of IR. He states that as long as there is no ulceration, the graft is stopped bleeding and there is a palpable thrill there is nothing more to be done at this point.  He states that Dermabond is good solution.  He does not need to follow-up with IR.   [AH]  1259 I spoke with Terri Piedra, RN Renal navigator. His HD clinic will call him directly with an appointment time for dialysis tomorrow.  I have reached out to [facility] team- patient needs help getting back to his vehicle.    [AH]    Clinical Course User Index [AH] Margarita Mail, PA-C   MDM Rules/Calculators/A&P                      Patient here with bleeding from his AV graft.  Bleeding resolved with pressure prior to my assessment.  I have sealed to the wound with a Dermabond..  I discussed the case with Dr. Carlis Abbott of vascular surgery who asked me to consult with IR.  I spoke with Dr. Kathlene Cote who states that Dermabond is an excellent option.  As long as his thrill is palpable he has no need to follow-up.  Patient has been set up for dialysis tomorrow.  I personally ordered interpreted and reviewed the patient's labs which show baseline end-stage renal disease with elevated BUN and creatinine, he is normokalemic.  EKG shows no acute abnormalities.  Patient given Voucher and is appropriate for discharge at this time Final Clinical Impression(s) / ED Diagnoses Final diagnoses:  None    Rx / DC Orders ED Discharge Orders    None       Margarita Mail, PA-C 01/31/20 [place], [place], DO 01/31/20 1628

## 2020-01-31 NOTE — Progress Notes (Signed)
Navigator received call this morning (10:00am) from OP HD clinic/[facility] Social Worker-T. [place] Crane that patient is at dialysis with a bleeding access, but refuses to allow EMS to be called to take him to the hospital because he will then not have a way to get back to the clinic to get his car at discharge. Navigator informed Ms. Nevada Crane that we will be able to provide patient with an Melburn Popper if he truly has no one to pick him up from the hospital.  Navigator received call from ED PA/A. Harris that patient is here from HD for bleeding access and has been cleared to return to HD to finish treatment either today or tomorrow. Navigator call [facility] clinic and spoke with Charge RN/Eric who states they need for patient's access to be evaluated. Per PA notes, IR was consulted with determination of no intervention needed at this time. Randall Hiss states the schedule has not been made for tomorrow, but that he will put patient on it and call patient directly later today with a seat time. Navigator states appreciation and called ED PA to inform.   Alphonzo Cruise, [place] Renal Navigator [phone]

## 2020-01-31 NOTE — Discharge Instructions (Addendum)
Call 911 if your develop bleeding from the graft

## 2020-01-31 NOTE — ED Triage Notes (Signed)
Pt was 1 hour into dialysis when the RN's pulled him off for bleeding around the site of the needle.  When he stood up, blood began spurting from the fistula. They were unable to stop the bleeding, even when pressure was applied.  EMS noted about 400 ml of blood on the floor.  EMS placed "[place]" and were able to control he bleeding.

## 2020-01-31 NOTE — Progress Notes (Signed)
CSW provided patient with a cab voucher to get back to the dialysis center to obtain his personal vehicle.  Madilyn Fireman, MSW, LCSW-A Transitions of Care  Clinical Social Worker  [facility] Emergency Departments  Medical ICU [phone]

## 2020-02-20 ENCOUNTER — Emergency Department (HOSPITAL_COMMUNITY)
Admission: EM | Admit: 2020-02-20 | Discharge: 2020-02-20 | Disposition: A | Discharge status: Home / Self Care | Payer: Self-pay | Attending: Emergency Medicine | Admitting: Emergency Medicine

## 2020-02-20 ENCOUNTER — Other Ambulatory Visit: Payer: Self-pay

## 2020-02-20 ENCOUNTER — Encounter (HOSPITAL_COMMUNITY): Payer: Self-pay | Admitting: Emergency Medicine

## 2020-02-20 ENCOUNTER — Emergency Department (HOSPITAL_COMMUNITY): Payer: Self-pay

## 2020-02-20 DIAGNOSIS — N189 Chronic kidney disease, unspecified: Secondary | ICD-10-CM

## 2020-02-20 DIAGNOSIS — R0602 Shortness of breath: Secondary | ICD-10-CM | POA: Insufficient documentation

## 2020-02-20 DIAGNOSIS — I120 Hypertensive chronic kidney disease with stage 5 chronic kidney disease or end stage renal disease: Secondary | ICD-10-CM | POA: Insufficient documentation

## 2020-02-20 DIAGNOSIS — D631 Anemia in chronic kidney disease: Secondary | ICD-10-CM | POA: Insufficient documentation

## 2020-02-20 DIAGNOSIS — Z79899 Other long term (current) drug therapy: Secondary | ICD-10-CM | POA: Insufficient documentation

## 2020-02-20 DIAGNOSIS — N186 End stage renal disease: Secondary | ICD-10-CM | POA: Insufficient documentation

## 2020-02-20 DIAGNOSIS — Z992 Dependence on renal dialysis: Secondary | ICD-10-CM | POA: Insufficient documentation

## 2020-02-20 DIAGNOSIS — J209 Acute bronchitis, unspecified: Secondary | ICD-10-CM | POA: Insufficient documentation

## 2020-02-20 LAB — CBC
HCT: 32.2 % — ABNORMAL LOW (ref 39.0–52.0)
Hemoglobin: 10.2 g/dL — ABNORMAL LOW (ref 13.0–17.0)
MCH: 30.7 pg (ref 26.0–34.0)
MCHC: 31.7 g/dL (ref 30.0–36.0)
MCV: 97 fL (ref 80.0–100.0)
Platelets: 278 10*3/uL (ref 150–400)
RBC: 3.32 MIL/uL — ABNORMAL LOW (ref 4.22–5.81)
RDW: 15 % (ref 11.5–15.5)
WBC: 4.8 10*3/uL (ref 4.0–10.5)
nRBC: 0 % (ref 0.0–0.2)

## 2020-02-20 LAB — BASIC METABOLIC PANEL
Anion gap: 15 (ref 5–15)
BUN: 30 mg/dL — ABNORMAL HIGH (ref 6–20)
CO2: 31 mmol/L (ref 22–32)
Calcium: 7.4 mg/dL — ABNORMAL LOW (ref 8.9–10.3)
Chloride: 93 mmol/L — ABNORMAL LOW (ref 98–111)
Creatinine, Ser: 9.19 mg/dL — ABNORMAL HIGH (ref 0.61–1.24)
GFR calc Af Amer: 7 mL/min — ABNORMAL LOW (ref >60)
GFR calc non Af Amer: 6 mL/min — ABNORMAL LOW (ref >60)
Glucose, Bld: 108 mg/dL — ABNORMAL HIGH (ref 70–99)
Potassium: 4.1 mmol/L (ref 3.5–5.1)
Sodium: 139 mmol/L (ref 135–145)

## 2020-02-20 MED ORDER — HYDROCODONE-ACETAMINOPHEN 5-325 MG PO TABS: 1.0000 | ORAL_TABLET | ORAL | 0 refills | Status: DC | PRN

## 2020-02-20 MED ORDER — ALBUTEROL SULFATE HFA 108 (90 BASE) MCG/ACT IN AERS
4.0000 | INHALATION_SPRAY | Freq: Once | RESPIRATORY_TRACT | Status: AC
  Administered 2020-02-20: 4 via RESPIRATORY_TRACT
  Filled 2020-02-20: qty 6.7

## 2020-02-20 MED ORDER — ALBUTEROL SULFATE (2.5 MG/3ML) 0.083% IN NEBU: 5.0000 mg | INHALATION_SOLUTION | Freq: Once | RESPIRATORY_TRACT | Status: DC

## 2020-02-20 MED ORDER — PREDNISONE 50 MG PO TABS
50.0000 mg | ORAL_TABLET | Freq: Every day | ORAL | 0 refills | Status: DC
Start: 2020-02-20 — End: 2021-01-25

## 2020-02-20 MED ORDER — PREDNISONE 20 MG PO TABS
60.0000 mg | ORAL_TABLET | Freq: Once | ORAL | Status: AC
  Administered 2020-02-20: 60 mg via ORAL
  Filled 2020-02-20: qty 3

## 2020-02-20 NOTE — ED Provider Notes (Addendum)
[facility] EMERGENCY DEPARTMENT Provider Note   CSN: [id number] Arrival date & time: 02/20/20  1093   History Chief Complaint  Patient presents with  . Cough  . Shortness of Breath    [name] is a 49 y.o. male.  The history is provided by the patient.  Cough Associated symptoms: shortness of breath   Shortness of Breath Associated symptoms: cough   He has a history of hypertension, end-stage renal disease on hemodialysis and comes in with 2-week history of nonproductive cough and dyspnea.  Dyspnea is worse at night.  It does not seem to be especially improved following dialysis.  He denies fever, chills, sweats.  He denies chest pain, heaviness, tightness, pressure.  He has not noticed any swelling.  He has not taken any medication for.  He denies any sick contacts.  He has not had COVID-19 vaccination.  He denies change in sense of smell or taste.  Past Medical History:  Diagnosis Date  . Anemia   . Anxiety   . Cough    DRY    . Depression   . ESRD on hemodialysis ([place])    HD [facility] MWF  . Headache(784.0)   . Hypertension   . Muscle spasms of neck    BACK, NECK  . Pneumonia 2015ish  . Thyroid disease     Patient Active Problem List   Diagnosis Date Noted  . Problem with dialysis access ([place]) 06/15/2018  . Hyperkalemia   . Problem with vascular access 10/07/2016  . Pseudoaneurysm of arteriovenous graft ([place]) 11/06/2015  . Complication from renal dialysis device   . ESRD (end stage renal disease) on dialysis ([place])   . Secondary hyperparathyroidism of renal origin ([place]) 04/24/2014  . Hyperparathyroidism, secondary ([place]) 03/21/2014    Past Surgical History:  Procedure Laterality Date  . ARTERIOVENOUS GRAFT PLACEMENT Left    "forearm, it's not working; thigh"   . ARTERIOVENOUS GRAFT PLACEMENT Left 11/09/2015  . AV FISTULA PLACEMENT Right 01/10/2017   Procedure: INSERTION OF ARTERIOVENOUS Right thigh GORE-TEX GRAFT;   Surgeon: [name], MD;  Location: [facility];  Service: Vascular;  Laterality: Right;  . FALSE ANEURYSM REPAIR Left 11/09/2015   Procedure: REPAIR OF LEFT FEMORAL PSEUDOANEURYSM; REVISION  OF LEFT THIGH ARTERIOVENOUS GRAFT USING 6MM X 10 CM GORETEX GRAFT ;  Surgeon: [name], MD;  Location: [facility] OR;  Service: Vascular;  Laterality: Left;  . IR AV DIALY SHUNT INTRO NEEDLE/INTRACATH INITIAL W/PTA/IMG RIGHT Right 02/06/2019  . IR AV DIALY SHUNT INTRO NEEDLE/INTRACATH INITIAL W/PTA/IMG RIGHT Right 05/14/2019  . IR DIALY SHUNT INTRO NEEDLE/INTRACATH INITIAL W/IMG RIGHT Right 09/17/2019  . IR DIALY SHUNT INTRO NEEDLE/INTRACATH INITIAL W/IMG RIGHT Right 12/10/2019  . IR GENERIC HISTORICAL  07/15/2016   IR US GUIDE VASC ACCESS LEFT 07/15/2016 [name], MD [facility]  . IR GENERIC HISTORICAL Left 07/15/2016   IR THROMBECTOMY AV FISTULA W/THROMBOLYSIS/PTA INC/SHUNT/IMG LEFT 07/15/2016 [name], MD [facility]  . IR GENERIC HISTORICAL  10/05/2016   IR US GUIDE VASC ACCESS LEFT 10/05/2016 [name], MD [facility]  . IR GENERIC HISTORICAL Left 10/05/2016   IR THROMBECTOMY AV FISTULA W/THROMBOLYSIS/PTA INC/SHUNT/IMG LEFT 10/05/2016 [name], MD [facility]  . IR GENERIC HISTORICAL  10/08/2016   IR FLUORO GUIDE CV LINE RIGHT 10/08/2016 [name], MD [facility]  . IR GENERIC HISTORICAL  10/08/2016   IR US GUIDE VASC ACCESS RIGHT 10/08/2016 [name], MD [facility]  . IR PTA  AND STENT ADDL CENTRAL DIALY SEG THRU DIALY CIRCUIT RIGHT Right 12/10/2019  . IR RADIOLOGIST EVAL & MGMT  11/26/2019  . IR REMOVAL TUN CV CATH W/O FL  03/16/2017  . IR THROMBECTOMY AV FISTULA W/THROMBOLYSIS/PTA INC/SHUNT/IMG RIGHT Right 06/16/2018  . IR THROMBECTOMY AV FISTULA W/THROMBOLYSIS/PTA INC/SHUNT/IMG RIGHT Right 06/17/2018  . IR US GUIDE VASC ACCESS RIGHT  06/17/2018  . IR US GUIDE VASC ACCESS RIGHT  06/16/2018  . IR US GUIDE VASC ACCESS RIGHT  02/06/2019  . IR US GUIDE VASC ACCESS RIGHT  05/14/2019  . IR US GUIDE  VASC ACCESS RIGHT  09/17/2019  . IR US GUIDE VASC ACCESS RIGHT  12/10/2019  . PARATHYROIDECTOMY N/A 04/24/2014   Procedure: TOTAL 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;  Surgeon: [name], MD;  Location: [facility];  Service: General;  Laterality: N/A;  NECK AND LEFT FOREARM  . PERIPHERAL VASCULAR CATHETERIZATION N/A 05/05/2016   Procedure: A/V Shuntogram;  Surgeon: Conrad Windsor, MD;  Location: [facility] CV LAB;  Service: Cardiovascular;  Laterality: N/A;  . PERIPHERAL VASCULAR CATHETERIZATION Left 05/05/2016   Procedure: Peripheral Vascular Balloon Angioplasty;  Surgeon: Conrad [place], MD;  Location: [place] CV LAB;  Service: Cardiovascular;  Laterality: Left;  . PSEUDOANEURYSM REPAIR Left 11/09/2015  . THROMBECTOMY / ARTERIOVENOUS GRAFT REVISION Left 08/18/2015   thigh  . THROMBECTOMY AND REVISION OF ARTERIOVENTOUS (AV) GORETEX  GRAFT Left 08/23/2015   Procedure: THROMBECTOMY AND REVISION OF ARTERIOVENTOUS (AV) GORETEX  GRAFT LEFT THIGH ;  Surgeon: [name], MD;  Location: [facility];  Service: Vascular;  Laterality: Left;  . THROMBECTOMY W/ EMBOLECTOMY Left 08/18/2015   Procedure: THROMBECTOMY  AND REVISION ARTERIOVENOUS GORE-TEX GRAFT/LEFT THIGH;  Surgeon: [name], MD;  Location: [facility];  Service: Vascular;  Laterality: Left;  . THROMBECTOMY W/ EMBOLECTOMY Right 06/19/2018   Procedure: THROMBECTOMY AND REVISION OF RIGHT THIGH  ARTERIOVENOUS GORE-TEX GRAFT;  Surgeon: [name], MD;  Location: [place];  Service: Vascular;  Laterality: Right;  . UPPER EXTREMITY VENOGRAPHY Bilateral 12/27/2016   Procedure: Upper Extremity Venography;  Surgeon: [name], MD;  Location: [place] CV LAB;  Service: Cardiovascular;  Laterality: Bilateral;  . VENOGRAM Left 05/05/2016   Procedure: Venogram;  Surgeon: Conrad [place]rsburg, MD;  Location: [place] [facility] CV LAB;  Service: Cardiovascular;  Laterality: Left;  lower extremity       Family History  Problem Relation Age of  Onset  . Renal Disease Neg Hx     Social History   Tobacco Use  . Smoking status: Former Smoker    Quit date: 03/21/1986    Years since quitting: 33.9  . Smokeless tobacco: Never Used  Substance Use Topics  . Alcohol use: No    Alcohol/week: 0.0 standard drinks  . Drug use: No    Home Medications Prior to Admission medications   Medication Sig Start Date End Date Taking? Authorizing Provider  acetaminophen (TYLENOL) 500 MG tablet Take 1,000 mg by mouth daily as needed (for pain or headaches).     [provider]  aspirin EC 81 MG tablet Take 81 mg by mouth daily.    [provider]  B Complex-C-Folic Acid (DIALYVITE TABLET) TABS Take 1 tablet by mouth every evening. 12/20/19   [provider]  benzonatate (TESSALON) 100 MG capsule Take 1 capsule (100 mg total) by mouth every 8 (eight) hours. Patient not taking: Reported on 06/15/2018 07/25/17   Larene Pickett, PA-C  calcium carbonate (TUMS -  DOSED IN MG ELEMENTAL CALCIUM) 500 MG chewable tablet Chew 4 tablets by mouth See admin instructions. 4 tabs w/ meals     [provider]  fluticasone (FLONASE) 50 MCG/ACT nasal spray Place 2 sprays into both nostrils daily. Patient not taking: Reported on 06/15/2018 07/25/17   Larene Pickett, PA-C  midodrine (PROAMATINE) 10 MG tablet Take 1 tablet (10 mg total) by mouth 3 (three) times daily with meals. Patient not taking: Reported on 10/16/2018 06/20/18   Thurnell Lose, MD  oxyCODONE-acetaminophen (PERCOCET/ROXICET) 5-325 MG tablet Take 1 tablet by mouth every 4 (four) hours as needed for moderate pain. Patient not taking: Reported on 06/15/2018 01/11/17   Ulyses Amor, PA-C  Vitamin D, Ergocalciferol, (DRISDOL) 1.25 MG (50000 UNIT) CAPS capsule Take 50,000 Units by mouth once a week. 11/20/19   [provider]    Allergies    Lisinopril, Ivp dye [iodinated diagnostic agents], and Solu-medrol [methylprednisolone]  Review of Systems   Review of  Systems  Respiratory: Positive for cough and shortness of breath.   All other systems reviewed and are negative.   Physical Exam Updated Vital Signs BP (!) 158/107 (BP Location: Left Arm)   Pulse 85   Temp (!) 97.5 F (36.4 C) (Oral)   Resp 18   SpO2 98%   Physical Exam Vitals and nursing note reviewed.   49 year old male, resting comfortably and in no acute distress. Vital signs are significant for elevated blood pressure. Oxygen saturation is 98%, which is normal. Head is normocephalic and atraumatic. PERRLA, EOMI. Oropharynx is clear. Neck is nontender and supple without adenopathy or JVD. Back is nontender and there is no CVA tenderness. Lungs are clear without rales, wheezes, or rhonchi.  Slightly prolonged exhalation phase is noted. Chest is nontender. Heart has regular rate and rhythm without murmur. Abdomen is soft, flat, nontender without masses or hepatosplenomegaly and peristalsis is normoactive. Extremities have no cyanosis or edema, full range of motion is present.  AV shunt is present in the right thigh with thrill present. Skin is warm and dry without rash. Neurologic: Mental status is normal, cranial nerves are intact, there are no motor or sensory deficits.  ED Results / Procedures / Treatments   Labs (all labs ordered are listed, but only abnormal results are displayed) Labs Reviewed  CBC - Abnormal; Notable for the following components:      Result Value   RBC 3.32 (*)    Hemoglobin 10.2 (*)    HCT 32.2 (*)    All other components within normal limits  BASIC METABOLIC PANEL - Abnormal; Notable for the following components:   Chloride 93 (*)    Glucose, Bld 108 (*)    BUN 30 (*)    Creatinine, Ser 9.19 (*)    Calcium 7.4 (*)    GFR calc non Af Amer 6 (*)    GFR calc Af Amer 7 (*)    All other components within normal limits   Radiology DG Chest Port 1 View  Result Date: 02/20/2020 CLINICAL DATA:  Cough and shortness of breath EXAM: PORTABLE CHEST  1 VIEW COMPARISON:  01/13/2020 FINDINGS: Normal heart size and mediastinal contours for portable technique. Postoperative thoracic inlet, likely parathyroidectomy given history of end-stage renal disease. No acute infiltrate or edema. No effusion or pneumothorax. No acute osseous findings. IMPRESSION: No active disease. Electronically Signed   By: Monte Fantasia M.D.   On: 02/20/2020 06:14    Procedures Procedures  Medications Ordered  in ED Medications  albuterol (PROVENTIL) (2.5 MG/3ML) 0.083% nebulizer solution 5 mg (has no administration in time range)    ED Course  I have reviewed the triage vital signs and the nursing notes.  Pertinent labs & imaging results that were available during my care of the patient were reviewed by me and considered in my medical decision making (see chart for details).  MDM Rules/Calculators/A&P Cough with dyspnea.  Probable viral bronchitis.  Will check chest x-ray to rule out pneumonia.  Will give therapeutic trial of albuterol.  Old records reviewed showing ED visit last month for upper respiratory infection.   Chest x-ray shows no evidence of pneumonia.  He had modest improvement with albuterol.  He will be given a dose of prednisone and discharged with instructions to use his albuterol inhaler as needed.  Also given prescription for short course of prednisone and a small number of hydrocodone-acetaminophen tablets to help suppress cough at bedtime.  Follow-up with PCP.  Return precautions discussed.  Final Clinical Impression(s) / ED Diagnoses Final diagnoses:  Acute bronchitis, unspecified organism  End-stage renal disease on hemodialysis ([place])  Anemia associated with chronic renal failure    Rx / DC Orders ED Discharge Orders         Ordered    predniSONE (DELTASONE) 50 MG tablet  Daily     02/20/20 0702    HYDROcodone-acetaminophen (NORCO) 5-325 MG tablet  Every 4 hours PRN,   Status:  Discontinued     02/20/20 0702     HYDROcodone-acetaminophen (NORCO) 5-325 MG tablet  Every 4 hours PRN     02/20/20 3149           Delora Fuel, MD 70/26/37 8588    Delora Fuel, MD 50/27/74 [phone]

## 2020-02-20 NOTE — Discharge Instructions (Addendum)
Use the inhaler - two puffs, every four hours, as needed to help with the cough and shortness of breath.  You may take the hydrocodone-acetaminophen at bedtime to help suppress the cough.  Return if symptoms are getting worse.

## 2020-02-20 NOTE — ED Triage Notes (Addendum)
Pt presents to ED POV. Pt states that he has been coughing and SOB for 2w. Pt states that he has been going to dialysis regularly. resp e/u.

## 2020-06-17 ENCOUNTER — Other Ambulatory Visit: Payer: Self-pay | Admitting: Student

## 2020-06-17 ENCOUNTER — Telehealth: Payer: Self-pay | Admitting: Student

## 2020-06-17 ENCOUNTER — Other Ambulatory Visit (HOSPITAL_COMMUNITY): Payer: Self-pay | Admitting: Nurse Practitioner

## 2020-06-17 DIAGNOSIS — N186 End stage renal disease: Secondary | ICD-10-CM

## 2020-06-17 NOTE — Telephone Encounter (Signed)
IR.   Patient is scheduled for an image-guided shuntogram with possible intervention 06/18/2020. He has an iodine dye allergy causing swelling, requiring pre-medications for procedures with iodine dye.   Faxed filled prescriptions to [facility] [phone]) at 1053: 1- Prednisone 50 mg tablets; take one tablet by mouth 13 hours, 7 hours, and 1 hour prior to procedure on 06/18/2020; dispense 3 tablets with 0 refills. 2- Benadryl 50 mg tablets; take one tablet by mouth 1 hour prior to procedure on 06/18/2020; dispense 1 tablet with 0 refills.  Called patient [phone]) at [phone] and left VM informing patient of pre-medications being called into pharmacy and encouraged patient to call IR [phone]) regarding questions on medications.  Please call IR with questions/concerns.   Bea Graff Sayer Masini, PA-C 06/17/2020, 11:15 AM

## 2020-06-18 ENCOUNTER — Ambulatory Visit (HOSPITAL_COMMUNITY): Admission: RE | Admit: 2020-06-18 | Payer: Self-pay | Source: Ambulatory Visit

## 2020-06-23 ENCOUNTER — Other Ambulatory Visit: Payer: Self-pay | Admitting: Radiology

## 2020-06-24 ENCOUNTER — Ambulatory Visit (HOSPITAL_COMMUNITY): Admission: RE | Admit: 2020-06-24 | Payer: Self-pay | Source: Ambulatory Visit

## 2020-06-24 ENCOUNTER — Other Ambulatory Visit: Payer: Self-pay | Admitting: Radiology

## 2020-06-25 ENCOUNTER — Encounter (HOSPITAL_COMMUNITY): Payer: Self-pay

## 2020-06-25 ENCOUNTER — Ambulatory Visit (HOSPITAL_COMMUNITY): Admission: RE | Admit: 2020-06-25 | Payer: Self-pay | Source: Ambulatory Visit

## 2020-07-06 ENCOUNTER — Other Ambulatory Visit: Payer: Self-pay

## 2020-07-06 ENCOUNTER — Emergency Department (HOSPITAL_COMMUNITY)
Admission: EM | Admit: 2020-07-06 | Discharge: 2020-07-06 | Disposition: A | Discharge status: Home / Self Care | Payer: Self-pay | Attending: Emergency Medicine | Admitting: Emergency Medicine

## 2020-07-06 DIAGNOSIS — R509 Fever, unspecified: Secondary | ICD-10-CM | POA: Insufficient documentation

## 2020-07-06 DIAGNOSIS — Z5321 Procedure and treatment not carried out due to patient leaving prior to being seen by health care provider: Secondary | ICD-10-CM | POA: Insufficient documentation

## 2020-07-06 DIAGNOSIS — M791 Myalgia, unspecified site: Secondary | ICD-10-CM | POA: Insufficient documentation

## 2020-07-06 DIAGNOSIS — R112 Nausea with vomiting, unspecified: Secondary | ICD-10-CM | POA: Insufficient documentation

## 2020-07-06 DIAGNOSIS — R0602 Shortness of breath: Secondary | ICD-10-CM | POA: Insufficient documentation

## 2020-07-06 DIAGNOSIS — Z20822 Contact with and (suspected) exposure to covid-19: Secondary | ICD-10-CM | POA: Insufficient documentation

## 2020-07-06 LAB — RESPIRATORY PANEL BY RT PCR (FLU A&B, COVID)
Influenza A by PCR: NEGATIVE
Influenza B by PCR: NEGATIVE
SARS Coronavirus 2 by RT PCR: NEGATIVE

## 2020-07-06 NOTE — ED Notes (Signed)
Pt states he has appointment at dialysis and cant miss it

## 2020-07-06 NOTE — ED Triage Notes (Signed)
Pt has been having generalized body aches, chills,fevers,  Nausea, vomiting and some sob. Pt is dialysis but compliant and has not missed any.

## 2020-08-13 ENCOUNTER — Telehealth: Payer: Self-pay | Admitting: Student

## 2020-08-13 NOTE — Telephone Encounter (Signed)
IR.  Patient is scheduled for an image-guided shuntogram with possible intervention 08/18/2020. He has an iodine dye allergy causing swelling, requiring pre-medications for procedures with iodine dye.  Faxed filled prescriptions to [facility] [phone]) at 0919: 1- Prednisone 50 mg tablets; take one tablet by mouth 13 hours, 7 hours, and 1 hour prior to procedure on 08/18/2020; dispense 3 tablets with 0 refills. 2- Benadryl 50 mg tablets; take one tablet by mouth 1 hour prior to procedure on 08/18/2020; dispense 1 tablet with 0 refills.   Bea Graff Simara Rhyner, PA-C 08/13/2020, 9:19 AM

## 2020-08-17 ENCOUNTER — Other Ambulatory Visit: Payer: Self-pay | Admitting: Student

## 2020-08-18 ENCOUNTER — Ambulatory Visit (HOSPITAL_COMMUNITY)
Admission: RE | Admit: 2020-08-18 | Discharge: 2020-08-18 | Disposition: A | Discharge status: Home / Self Care | Payer: Self-pay | Source: Ambulatory Visit | Attending: Nurse Practitioner | Admitting: Nurse Practitioner

## 2020-08-18 ENCOUNTER — Other Ambulatory Visit (HOSPITAL_COMMUNITY): Payer: Self-pay | Admitting: Nurse Practitioner

## 2020-08-18 ENCOUNTER — Other Ambulatory Visit: Payer: Self-pay

## 2020-08-18 DIAGNOSIS — Z79899 Other long term (current) drug therapy: Secondary | ICD-10-CM | POA: Insufficient documentation

## 2020-08-18 DIAGNOSIS — T82858A Stenosis of vascular prosthetic devices, implants and grafts, initial encounter: Secondary | ICD-10-CM | POA: Insufficient documentation

## 2020-08-18 DIAGNOSIS — I120 Hypertensive chronic kidney disease with stage 5 chronic kidney disease or end stage renal disease: Secondary | ICD-10-CM | POA: Insufficient documentation

## 2020-08-18 DIAGNOSIS — Y841 Kidney dialysis as the cause of abnormal reaction of the patient, or of later complication, without mention of misadventure at the time of the procedure: Secondary | ICD-10-CM | POA: Insufficient documentation

## 2020-08-18 DIAGNOSIS — F419 Anxiety disorder, unspecified: Secondary | ICD-10-CM | POA: Insufficient documentation

## 2020-08-18 DIAGNOSIS — Z992 Dependence on renal dialysis: Secondary | ICD-10-CM | POA: Insufficient documentation

## 2020-08-18 DIAGNOSIS — D631 Anemia in chronic kidney disease: Secondary | ICD-10-CM | POA: Insufficient documentation

## 2020-08-18 DIAGNOSIS — N186 End stage renal disease: Secondary | ICD-10-CM | POA: Insufficient documentation

## 2020-08-18 DIAGNOSIS — F329 Major depressive disorder, single episode, unspecified: Secondary | ICD-10-CM | POA: Insufficient documentation

## 2020-08-18 DIAGNOSIS — Z87891 Personal history of nicotine dependence: Secondary | ICD-10-CM | POA: Insufficient documentation

## 2020-08-18 HISTORY — PX: IR AV DIALY SHUNT INTRO NEEDLE/INTRACATH INITIAL W/PTA/IMG RIGHT: IMG6116

## 2020-08-18 HISTORY — PX: IR US GUIDE VASC ACCESS RIGHT: IMG2390

## 2020-08-18 LAB — CBC WITH DIFFERENTIAL/PLATELET
Abs Immature Granulocytes: 0.02 10*3/uL (ref 0.00–0.07)
Basophils Absolute: 0 10*3/uL (ref 0.0–0.1)
Basophils Relative: 0 %
Eosinophils Absolute: 0 10*3/uL (ref 0.0–0.5)
Eosinophils Relative: 0 %
HCT: 36.9 % — ABNORMAL LOW (ref 39.0–52.0)
Hemoglobin: 11.6 g/dL — ABNORMAL LOW (ref 13.0–17.0)
Immature Granulocytes: 0 %
Lymphocytes Relative: 8 %
Lymphs Abs: 0.4 10*3/uL — ABNORMAL LOW (ref 0.7–4.0)
MCH: 30 pg (ref 26.0–34.0)
MCHC: 31.4 g/dL (ref 30.0–36.0)
MCV: 95.3 fL (ref 80.0–100.0)
Monocytes Absolute: 0.1 10*3/uL (ref 0.1–1.0)
Monocytes Relative: 1 %
Neutro Abs: 4.5 10*3/uL (ref 1.7–7.7)
Neutrophils Relative %: 91 %
Platelets: 329 10*3/uL (ref 150–400)
RBC: 3.87 MIL/uL — ABNORMAL LOW (ref 4.22–5.81)
RDW: 14.1 % (ref 11.5–15.5)
WBC: 5 10*3/uL (ref 4.0–10.5)
nRBC: 0 % (ref 0.0–0.2)

## 2020-08-18 LAB — BASIC METABOLIC PANEL
Anion gap: 15 (ref 5–15)
BUN: 31 mg/dL — ABNORMAL HIGH (ref 6–20)
CO2: 26 mmol/L (ref 22–32)
Calcium: 10.5 mg/dL — ABNORMAL HIGH (ref 8.9–10.3)
Chloride: 97 mmol/L — ABNORMAL LOW (ref 98–111)
Creatinine, Ser: 10.08 mg/dL — ABNORMAL HIGH (ref 0.61–1.24)
GFR, Estimated: 6 mL/min — ABNORMAL LOW (ref >60)
Glucose, Bld: 137 mg/dL — ABNORMAL HIGH (ref 70–99)
Potassium: 4.1 mmol/L (ref 3.5–5.1)
Sodium: 138 mmol/L (ref 135–145)

## 2020-08-18 MED ORDER — SODIUM CHLORIDE 0.9 % IV SOLN
INTRAVENOUS | Status: AC | PRN
  Administered 2020-08-18: 10 mL/h via INTRAVENOUS

## 2020-08-18 MED ORDER — IOHEXOL 300 MG/ML  SOLN: 100.0000 mL | Freq: Once | INTRAMUSCULAR | Status: DC | PRN

## 2020-08-18 MED ORDER — DIPHENHYDRAMINE HCL 50 MG PO CAPS
50.0000 mg | ORAL_CAPSULE | Freq: Once | ORAL | Status: AC
  Administered 2020-08-18: 50 mg via ORAL
  Filled 2020-08-18: qty 1

## 2020-08-18 MED ORDER — LIDOCAINE HCL 1 % IJ SOLN
INTRAMUSCULAR | Status: AC
  Filled 2020-08-18: qty 20

## 2020-08-18 MED ORDER — IOHEXOL 300 MG/ML  SOLN
100.0000 mL | Freq: Once | INTRAMUSCULAR | Status: AC | PRN
  Administered 2020-08-18: 30 mL

## 2020-08-18 MED ORDER — MIDAZOLAM HCL 2 MG/2ML IJ SOLN
INTRAMUSCULAR | Status: AC | PRN
  Administered 2020-08-18 (×2): 0.5 mg via INTRAVENOUS
  Administered 2020-08-18: 1 mg via INTRAVENOUS

## 2020-08-18 MED ORDER — FENTANYL CITRATE (PF) 100 MCG/2ML IJ SOLN
INTRAMUSCULAR | Status: AC
  Filled 2020-08-18: qty 2

## 2020-08-18 MED ORDER — MIDAZOLAM HCL 2 MG/2ML IJ SOLN
INTRAMUSCULAR | Status: AC
  Filled 2020-08-18: qty 2

## 2020-08-18 MED ORDER — SODIUM CHLORIDE 0.9 % IV SOLN: INTRAVENOUS | Status: DC

## 2020-08-18 MED ORDER — FENTANYL CITRATE (PF) 100 MCG/2ML IJ SOLN
INTRAMUSCULAR | Status: AC | PRN
Start: 2020-08-18 — End: 2020-08-18
  Administered 2020-08-18: 25 ug via INTRAVENOUS
  Administered 2020-08-18: 50 ug via INTRAVENOUS
  Administered 2020-08-18: 25 ug via INTRAVENOUS

## 2020-08-18 MED ORDER — CEFAZOLIN SODIUM-DEXTROSE 2-4 GM/100ML-% IV SOLN
INTRAVENOUS | Status: AC
  Filled 2020-08-18: qty 100

## 2020-08-18 NOTE — Procedures (Signed)
Interventional Radiology Procedure Note  Procedure: rt fem AVG shuntogram with 14 mm iliac stent repeat PTA    Complications: None  Estimated Blood Loss:  min  Findings: Recurrent instent stenosis with good response to 14 mm atlas PTA    Tamera Punt, MD

## 2020-08-18 NOTE — Discharge Instructions (Signed)

## 2020-08-18 NOTE — H&P (Signed)
Chief Complaint: Patient was seen in consultation today for ESRD on HD with bleeding from shunt/shuntogram with possible intervention.  Referring Physician(s): Valentina Gu (nephrology)  Supervising Physician: Daryll Brod  Patient Status: [facility] - Out-pt  History of Present Illness: [name] is a 49 y.o. male with a past medical history of hypertension, pneumonia, thyroid disease, ESRD on HD, anemia, depression, and anxiety. He undergoes dialysis on Mondays, Wednesdays, and Fridays via right thigh shunt. Patient states that approximately 2 weeks ago he had bleeding from around site of needle stick during dialysis. Since, they have switched sites where his shunt is accessed, and no further bleeding has occurred. In addition, patient states that since bleeding has occurred, he has had daily lower extremity swelling, R>L. He states that otherwise he has completed dialysis without issues.  IR consulted by Juanell Fairly, NP for possible image-guided shuntogram with possible intervention. Patient awake and alert sitting in bed with no complaints at this time. Denies fever, chills, chest pain, dyspnea, abdominal pain, or headache.   Past Medical History:  Diagnosis Date  . Anemia   . Anxiety   . Cough    DRY    . Depression   . ESRD on hemodialysis ([place])    HD [place] MWF  . Headache(784.0)   . Hypertension   . Muscle spasms of neck    BACK, NECK  . Pneumonia 2015ish  . Thyroid disease     Past Surgical History:  Procedure Laterality Date  . ARTERIOVENOUS GRAFT PLACEMENT Left    "forearm, it's not working; thigh"   . ARTERIOVENOUS GRAFT PLACEMENT Left 11/09/2015  . AV FISTULA PLACEMENT Right 01/10/2017   Procedure: INSERTION OF ARTERIOVENOUS Right thigh GORE-TEX GRAFT;  Surgeon: [name], MD;  Location: [place];  Service: Vascular;  Laterality: Right;  . FALSE ANEURYSM REPAIR Left 11/09/2015   Procedure: REPAIR OF LEFT FEMORAL PSEUDOANEURYSM;  REVISION  OF LEFT THIGH ARTERIOVENOUS GRAFT USING 6MM X 10 CM GORETEX GRAFT ;  Surgeon: [name], MD;  Location: [facility] OR;  Service: Vascular;  Laterality: Left;  . IR AV DIALY SHUNT INTRO NEEDLE/INTRACATH INITIAL W/PTA/IMG RIGHT Right 02/06/2019  . IR AV DIALY SHUNT INTRO NEEDLE/INTRACATH INITIAL W/PTA/IMG RIGHT Right 05/14/2019  . IR DIALY SHUNT INTRO NEEDLE/INTRACATH INITIAL W/IMG RIGHT Right 09/17/2019  . IR DIALY SHUNT INTRO NEEDLE/INTRACATH INITIAL W/IMG RIGHT Right 12/10/2019  . IR GENERIC HISTORICAL  07/15/2016   IR US GUIDE VASC ACCESS LEFT 07/15/2016 [name], MD [facility]-INTER[facility] RAD  . IR GENERIC HISTORICAL Left 07/15/2016   IR THROMBECTOMY AV FISTULA W/THROMBOLYSIS/PTA INC/SHUNT/IMG LEFT 07/15/2016 [name], MD [facility]  . IR GENERIC HISTORICAL  10/05/2016   IR US GUIDE VASC ACCESS LEFT 10/05/2016 [name], MD [facility]  . IR GENERIC HISTORICAL Left 10/05/2016   IR THROMBECTOMY AV FISTULA W/THROMBOLYSIS/PTA INC/SHUNT/IMG LEFT 10/05/2016 [name], MD [facility]  . IR GENERIC HISTORICAL  10/08/2016   IR FLUORO GUIDE CV LINE RIGHT 10/08/2016 [name], MD [facility]  . IR GENERIC HISTORICAL  10/08/2016   IR US GUIDE VASC ACCESS RIGHT 10/08/2016 [name], MD [facility]  . IR PTA AND STENT ADDL CENTRAL DIALY SEG THRU DIALY CIRCUIT RIGHT Right 12/10/2019  . IR RADIOLOGIST EVAL & MGMT  11/26/2019  . IR REMOVAL TUN CV CATH W/O FL  03/16/2017  . IR THROMBECTOMY AV FISTULA W/THROMBOLYSIS/PTA INC/SHUNT/IMG RIGHT Right 06/16/2018  . IR THROMBECTOMY AV FISTULA W/THROMBOLYSIS/PTA INC/SHUNT/IMG RIGHT Right 06/17/2018  . IR US  GUIDE VASC ACCESS RIGHT  06/17/2018  . IR US GUIDE VASC ACCESS RIGHT  06/16/2018  . IR US GUIDE VASC ACCESS RIGHT  02/06/2019  . IR US GUIDE VASC ACCESS RIGHT  05/14/2019  . IR US GUIDE VASC ACCESS RIGHT  09/17/2019  . IR US GUIDE VASC ACCESS RIGHT  12/10/2019  . PARATHYROIDECTOMY N/A 04/24/2014   Procedure: TOTAL 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;   Surgeon: [name], MD;  Location: [place];  Service: General;  Laterality: N/A;  NECK AND LEFT FOREARM  . PERIPHERAL VASCULAR CATHETERIZATION N/A 05/05/2016   Procedure: A/V Shuntogram;  Surgeon: Conrad Buck Meadows, MD;  Location: [place] CV LAB;  Service: Cardiovascular;  Laterality: N/A;  . PERIPHERAL VASCULAR CATHETERIZATION Left 05/05/2016   Procedure: Peripheral Vascular Balloon Angioplasty;  Surgeon: Conrad Middletown, MD;  Location: [place] CV LAB;  Service: Cardiovascular;  Laterality: Left;  . PSEUDOANEURYSM REPAIR Left 11/09/2015  . THROMBECTOMY / ARTERIOVENOUS GRAFT REVISION Left 08/18/2015   thigh  . THROMBECTOMY AND REVISION OF ARTERIOVENTOUS (AV) GORETEX  GRAFT Left 08/23/2015   Procedure: THROMBECTOMY AND REVISION OF ARTERIOVENTOUS (AV) GORETEX  GRAFT LEFT THIGH ;  Surgeon: [name], MD;  Location: [place];  Service: Vascular;  Laterality: Left;  . THROMBECTOMY W/ EMBOLECTOMY Left 08/18/2015   Procedure: THROMBECTOMY  AND REVISION ARTERIOVENOUS GORE-TEX GRAFT/LEFT THIGH;  Surgeon: [name], MD;  Location: [place];  Service: Vascular;  Laterality: Left;  . THROMBECTOMY W/ EMBOLECTOMY Right 06/19/2018   Procedure: THROMBECTOMY AND REVISION OF RIGHT THIGH  ARTERIOVENOUS GORE-TEX GRAFT;  Surgeon: [name], MD;  Location: [place];  Service: Vascular;  Laterality: Right;  . UPPER EXTREMITY VENOGRAPHY Bilateral 12/27/2016   Procedure: Upper Extremity Venography;  Surgeon: [name], MD;  Location: [place] CV LAB;  Service: Cardiovascular;  Laterality: Bilateral;  . VENOGRAM Left 05/05/2016   Procedure: Venogram;  Surgeon: Conrad Tignall, MD;  Location: [place] CV LAB;  Service: Cardiovascular;  Laterality: Left;  lower extremity    Allergies: Lisinopril, Ivp dye [iodinated diagnostic agents], and Solu-medrol [methylprednisolone]  Medications: Prior to Admission medications   Medication Sig Start Date End Date Taking? Authorizing Provider    acetaminophen (TYLENOL) 500 MG tablet Take 1,000 mg by mouth daily as needed (for pain or headaches).    Yes [provider]  amLODipine (NORVASC) 10 MG tablet Take 10 mg by mouth daily.   Yes [provider]  B Complex-C-Folic Acid (DIALYVITE TABLET) TABS Take 1 tablet by mouth daily. 06/29/20  Yes [provider]  hydrALAZINE (APRESOLINE) 25 MG tablet Take 25 mg by mouth 3 (three) times daily. 06/10/20  Yes [provider]  lidocaine-prilocaine (EMLA) cream Apply 1 application topically daily as needed (port access).  02/03/20  Yes [provider]  TUMS ULTRA 1000 1000 MG chewable tablet Chew 4,000 mg by mouth 3 (three) times daily with meals. 03/14/20  Yes [provider]  predniSONE (DELTASONE) 50 MG tablet Take 1 tablet (50 mg total) by mouth daily. Patient not taking: Reported on 45/80/9983 3/82/50   Delora Fuel, MD  fluticasone [facility]) 50 MCG/ACT nasal spray Place 2 sprays into both nostrils daily. Patient not taking: Reported on 06/15/2018 07/25/17 02/20/20  Larene Pickett, PA-C     Family History  Problem Relation Age of Onset  . Renal Disease Neg Hx     Social History   Socioeconomic History  . Marital status: Married    Spouse name: Not  on file  . Number of children: Not on file  . Years of education: Not on file  . Highest education level: Not on file  Occupational History  . Not on file  Tobacco Use  . Smoking status: Former Smoker    Quit date: 03/21/1986    Years since quitting: 34.4  . Smokeless tobacco: Never Used  Substance and Sexual Activity  . Alcohol use: No    Alcohol/week: 0.0 standard drinks  . Drug use: No  . Sexual activity: Not on file  Other Topics Concern  . Not on file  Social History Narrative  . Not on file   Social Determinants of Health   Financial Resource Strain:   . Difficulty of Paying Living Expenses: Not on file  Food Insecurity:   . Worried About Charity fundraiser in the Last  Year: Not on file  . Ran Out of Food in the Last Year: Not on file  Transportation Needs:   . Lack of Transportation (Medical): Not on file  . Lack of Transportation (Non-Medical): Not on file  Physical Activity:   . Days of Exercise per Week: Not on file  . Minutes of Exercise per Session: Not on file  Stress:   . Feeling of Stress : Not on file  Social Connections:   . Frequency of Communication with Friends and Family: Not on file  . Frequency of Social Gatherings with Friends and Family: Not on file  . Attends Religious Services: Not on file  . Active Member of Clubs or Organizations: Not on file  . Attends Archivist Meetings: Not on file  . Marital Status: Not on file     Review of Systems: A 12 point ROS discussed and pertinent positives are indicated in the HPI above.  All other systems are negative.  Review of Systems  Constitutional: Negative for chills and fever.  Respiratory: Negative for shortness of breath and wheezing.   Cardiovascular: Negative for chest pain and palpitations.  Gastrointestinal: Negative for abdominal pain.  Neurological: Negative for headaches.  Psychiatric/Behavioral: Negative for behavioral problems and confusion.    Vital Signs: BP (!) 180/99   Pulse 97   Temp 98.2 F (36.8 C) (Oral)   Ht 6' (1.829 m)   Wt 167 lb 8.8 oz (76 kg)   SpO2 97%   BMI 22.72 kg/m   Physical Exam Vitals and nursing note reviewed.  Constitutional:      General: He is not in acute distress.    Appearance: Normal appearance.  Cardiovascular:     Rate and Rhythm: Normal rate and regular rhythm.     Heart sounds: Normal heart sounds. No murmur heard.   Pulmonary:     Effort: Pulmonary effort is normal. No respiratory distress.     Breath sounds: Normal breath sounds. No wheezing.  Skin:    General: Skin is warm and dry.     Comments: Right thigh shunt site without tenderness, erythema, drainage, or active bleeding; palpable thrill and bruit  heard on ascultation. Bilateral lower extremity edema, R>L.  Neurological:     Mental Status: He is alert and oriented to person, place, and time.      MD Evaluation Airway: WNL Heart: WNL Abdomen: WNL Chest/ Lungs: WNL ASA  Classification: 3 Mallampati/Airway Score: Two   Imaging: No results found.  Labs:  CBC: Recent Labs    12/10/19 0736 01/13/20 1641 01/31/20 1151 02/20/20 0105  WBC 7.5 7.8 4.5 4.8  HGB 12.5*  10.6* 9.9* 10.2*  HCT 39.1 32.1* 31.6* 32.2*  PLT 252 260 230 278    COAGS: Recent Labs    09/17/19 1111 12/10/19 0736  INR 0.9 0.9    BMP: Recent Labs    12/10/19 0736 01/13/20 1641 01/31/20 1151 02/20/20 0105  NA 137 139 142 139  K 4.9 3.8 4.0 4.1  CL 90* 95* 98 93*  CO2 26 26 31 31   GLUCOSE 133* 101* 84 108*  BUN 45* 35* 36* 30*  CALCIUM 8.5* 7.3* 8.3* 7.4*  CREATININE 12.33* 8.98* 10.43* 9.19*  GFRNONAA 4* 6* 5* 6*  GFRAA 5* 7* 6* 7*     Assessment and Plan:  History of ESRD on HD via right thigh shuntogram, with bleeding of shunt during dialysis access along with swelling of bilateral lower extremities (R>L). Plan for image-guided shuntogram with possible intervention; with possible image-guided tunneled HD catheter placement. Patient is NPO. Afebrile. He has taken his pre-medications for his iodine allergy as instructed.  Risks and benefits discussed with the patient including, but not limited to bleeding, infection, vascular injury, pulmonary embolism, need for tunneled HD catheter placement or even death. All of the patient's questions were answered, patient is agreeable to proceed. Consent signed and in chart.   Thank you for this interesting consult.  I greatly enjoyed meeting Haley Abdou Franklyn and look forward to participating in their care.  A copy of this report was sent to the requesting provider on this date.  Electronically Signed: Earley Abide, PA-C 08/18/2020, 10:52 AM   I spent a total of 40 Minutes  in face to face in clinical consultation, greater than 50% of which was counseling/coordinating care for ESRD on HD with bleeding from shunt/shuntogram with possible intervention.

## 2021-01-24 ENCOUNTER — Emergency Department (HOSPITAL_COMMUNITY): Payer: Self-pay

## 2021-01-24 ENCOUNTER — Emergency Department (HOSPITAL_COMMUNITY)
Admission: EM | Admit: 2021-01-24 | Discharge: 2021-01-25 | Disposition: A | Discharge status: Home / Self Care | Payer: Self-pay | Attending: Emergency Medicine | Admitting: Emergency Medicine

## 2021-01-24 ENCOUNTER — Other Ambulatory Visit: Payer: Self-pay

## 2021-01-24 DIAGNOSIS — I120 Hypertensive chronic kidney disease with stage 5 chronic kidney disease or end stage renal disease: Secondary | ICD-10-CM | POA: Insufficient documentation

## 2021-01-24 DIAGNOSIS — Z79899 Other long term (current) drug therapy: Secondary | ICD-10-CM | POA: Insufficient documentation

## 2021-01-24 DIAGNOSIS — J209 Acute bronchitis, unspecified: Secondary | ICD-10-CM | POA: Insufficient documentation

## 2021-01-24 DIAGNOSIS — Z992 Dependence on renal dialysis: Secondary | ICD-10-CM | POA: Insufficient documentation

## 2021-01-24 DIAGNOSIS — Z87891 Personal history of nicotine dependence: Secondary | ICD-10-CM | POA: Insufficient documentation

## 2021-01-24 DIAGNOSIS — Z20822 Contact with and (suspected) exposure to covid-19: Secondary | ICD-10-CM | POA: Insufficient documentation

## 2021-01-24 DIAGNOSIS — N186 End stage renal disease: Secondary | ICD-10-CM | POA: Insufficient documentation

## 2021-01-24 NOTE — ED Triage Notes (Signed)
Emergency Medicine Provider Triage Evaluation Note  [name] , a 50 y.o. male  was evaluated in triage.  Pt complains of non-productive cough and sore throat x3 weeks. No fever or chills. Intermittent shortness of breath. No history of asthma. Denies sick contacts and known COVID exposures.   Review of Systems  Positive: Cough, sore throat Negative: fever  Physical Exam  BP (!) 157/98 (BP Location: Right Arm)   Pulse 81   Temp 99.2 F (37.3 C) (Oral)   Resp 18   Ht 6' (1.829 m)   Wt 73.5 kg   SpO2 99%   BMI 21.98 kg/m  Gen:   Awake, no distress   HEENT:  Atraumatic  Resp:  Normal effort  Cardiac:  Normal rate  Abd:   Nondistended, nontender  MSK:   Moves extremities without difficulty  Neuro:  Speech clear   Medical Decision Making  Medically screening exam initiated at 8:59 PM.  Appropriate orders placed.  [name] was informed that the remainder of the evaluation will be completed by another provider, this initial triage assessment does not replace that evaluation, and the importance of remaining in the ED until their evaluation is complete.  Clinical Impression  Cough and sore throat x3 weeks. CXR to rule out pneumonia. COVID test.    Suzy Bouchard, PA-C 01/24/21 2100

## 2021-01-24 NOTE — ED Triage Notes (Signed)
Non-productive cough and sore throat x 3 weeks. Denies any fever and chills.   Denies any hx of asthma and COPD.

## 2021-01-25 LAB — RESP PANEL BY RT-PCR (FLU A&B, COVID) ARPGX2
Influenza A by PCR: NEGATIVE
Influenza B by PCR: NEGATIVE
SARS Coronavirus 2 by RT PCR: NEGATIVE

## 2021-01-25 MED ORDER — PREDNISONE 20 MG PO TABS
60.0000 mg | ORAL_TABLET | Freq: Once | ORAL | Status: AC
  Administered 2021-01-25: 60 mg via ORAL
  Filled 2021-01-25: qty 3

## 2021-01-25 MED ORDER — HYDROCODONE-ACETAMINOPHEN 5-325 MG PO TABS: 1.0000 | ORAL_TABLET | ORAL | 0 refills | Status: DC | PRN

## 2021-01-25 MED ORDER — ALBUTEROL SULFATE HFA 108 (90 BASE) MCG/ACT IN AERS
2.0000 | INHALATION_SPRAY | Freq: Once | RESPIRATORY_TRACT | Status: AC
  Administered 2021-01-25: 2 via RESPIRATORY_TRACT
  Filled 2021-01-25: qty 6.7

## 2021-01-25 MED ORDER — PREDNISONE 50 MG PO TABS: 50.0000 mg | ORAL_TABLET | Freq: Every day | ORAL | 0 refills | Status: DC

## 2021-01-25 MED ORDER — IPRATROPIUM-ALBUTEROL 0.5-2.5 (3) MG/3ML IN SOLN
3.0000 mL | Freq: Once | RESPIRATORY_TRACT | Status: AC
  Administered 2021-01-25: 3 mL via RESPIRATORY_TRACT
  Filled 2021-01-25: qty 3

## 2021-01-25 NOTE — ED Notes (Signed)
E-signature pad unavailable at time of pt discharge. This RN discussed discharge materials with pt and answered all pt questions. Pt stated understanding of discharge material. ? ?

## 2021-01-25 NOTE — ED Notes (Signed)
Warm blanket given to pt

## 2021-01-25 NOTE — Discharge Instructions (Addendum)
Please use the inhaler-2 puffs, every 4 hours-as needed to help control the cough.    For severe cough which is keeping you from sleeping, you may take the hydrocodone-acetaminophen.    Return if symptoms are getting worse.

## 2021-01-25 NOTE — ED Provider Notes (Signed)
[name] Provider Note   CSN: [id number] Arrival date & time: 01/24/21  2012     History Chief Complaint  Patient presents with  . Cough    [name] is a 50 y.o. male.  The history is provided by the patient.  Cough He has history of hypertension, end-stage renal disease on hemodialysis and comes in with cough for the last 3 weeks.  Cough is nonproductive.  There is associated dyspnea.  He denies fever, chills, sweats.  He denies chest pain, heaviness, tightness, pressure.  He denies sore throat, nausea, vomiting, diarrhea.  He has had body aches.  He has been using over-the-counter cough medicine which only give slight, temporary relief.  He denies any sick contacts.  He has been vaccinated against COVID-19 but without a booster vaccination.  He has not received the flu vaccine.  He is due for dialysis in the morning.  Past Medical History:  Diagnosis Date  . Anemia   . Anxiety   . Cough    DRY    . Depression   . ESRD on hemodialysis ([place])    HD [facility] MWF  . Headache(784.0)   . Hypertension   . Muscle spasms of neck    BACK, NECK  . Pneumonia 2015ish  . Thyroid disease     Patient Active Problem List   Diagnosis Date Noted  . Problem with dialysis access ([place]) 06/15/2018  . Hyperkalemia   . Problem with vascular access 10/07/2016  . Pseudoaneurysm of arteriovenous graft ([place]) 11/06/2015  . Complication from renal dialysis device   . ESRD (end stage renal disease) on dialysis ([place])   . Secondary hyperparathyroidism of renal origin ([place]) 04/24/2014  . Hyperparathyroidism, secondary ([place]) 03/21/2014    Past Surgical History:  Procedure Laterality Date  . ARTERIOVENOUS GRAFT PLACEMENT Left    "forearm, it's not working; thigh"   . ARTERIOVENOUS GRAFT PLACEMENT Left 11/09/2015  . AV FISTULA PLACEMENT Right 01/10/2017   Procedure: INSERTION OF ARTERIOVENOUS Right thigh GORE-TEX GRAFT;  Surgeon: [name], MD;  Location: [place] [facility];  Service: Vascular;  Laterality: Right;  . FALSE ANEURYSM REPAIR Left 11/09/2015   Procedure: REPAIR OF LEFT FEMORAL PSEUDOANEURYSM; REVISION  OF LEFT THIGH ARTERIOVENOUS GRAFT USING 6MM X 10 CM GORETEX GRAFT ;  Surgeon: [name], MD;  [facility];  Service: Vascular;  Laterality: Left;  . IR AV DIALY SHUNT INTRO NEEDLE/INTRACATH INITIAL W/PTA/IMG RIGHT Right 02/06/2019  . IR AV DIALY SHUNT INTRO NEEDLE/INTRACATH INITIAL W/PTA/IMG RIGHT Right 05/14/2019  . IR AV DIALY SHUNT INTRO NEEDLE/INTRACATH INITIAL W/PTA/IMG RIGHT Right 08/18/2020  . IR DIALY SHUNT INTRO NEEDLE/INTRACATH INITIAL W/IMG RIGHT Right 09/17/2019  . IR DIALY SHUNT INTRO NEEDLE/INTRACATH INITIAL W/IMG RIGHT Right 12/10/2019  . IR GENERIC HISTORICAL  07/15/2016   IR US GUIDE VASC ACCESS LEFT 07/15/2016 [name], MD [facility]  . IR GENERIC HISTORICAL Left 07/15/2016   IR THROMBECTOMY AV FISTULA W/THROMBOLYSIS/PTA INC/SHUNT/IMG LEFT 07/15/2016 [name], MD [facility]  . IR GENERIC HISTORICAL  10/05/2016   IR US GUIDE VASC ACCESS LEFT 10/05/2016 [name], MD [facility]  . IR GENERIC HISTORICAL Left 10/05/2016   IR THROMBECTOMY AV FISTULA W/THROMBOLYSIS/PTA INC/SHUNT/IMG LEFT 10/05/2016 [name], MD [facility]  . IR GENERIC HISTORICAL  10/08/2016   IR FLUORO GUIDE CV LINE RIGHT 10/08/2016 [name], MD [facility]  . IR GENERIC HISTORICAL  10/08/2016   IR US GUIDE VASC ACCESS RIGHT  10/08/2016 [name], MD [facility]  . IR PTA AND STENT ADDL CENTRAL DIALY SEG THRU DIALY CIRCUIT RIGHT Right 12/10/2019  . IR RADIOLOGIST EVAL & MGMT  11/26/2019  . IR REMOVAL TUN CV CATH W/O FL  03/16/2017  . IR THROMBECTOMY AV FISTULA W/THROMBOLYSIS/PTA INC/SHUNT/IMG RIGHT Right 06/16/2018  . IR THROMBECTOMY AV FISTULA W/THROMBOLYSIS/PTA INC/SHUNT/IMG RIGHT Right 06/17/2018  . IR US GUIDE VASC ACCESS RIGHT  06/17/2018  . IR US GUIDE VASC ACCESS RIGHT  06/16/2018  . IR US GUIDE VASC ACCESS RIGHT   02/06/2019  . IR US GUIDE VASC ACCESS RIGHT  05/14/2019  . IR US GUIDE VASC ACCESS RIGHT  09/17/2019  . IR US GUIDE VASC ACCESS RIGHT  12/10/2019  . IR US GUIDE VASC ACCESS RIGHT  08/18/2020  . PARATHYROIDECTOMY N/A 04/24/2014   Procedure: TOTAL 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;  Surgeon: [name], MD;  Location: [place];  Service: General;  Laterality: N/A;  NECK AND LEFT FOREARM  . PERIPHERAL VASCULAR CATHETERIZATION N/A 05/05/2016   Procedure: A/V Shuntogram;  Surgeon: Conrad Hollins, MD;  Location: [place] [facility];  Service: Cardiovascular;  Laterality: N/A;  . PERIPHERAL VASCULAR CATHETERIZATION Left 05/05/2016   Procedure: Peripheral Vascular Balloon Angioplasty;  Surgeon: Conrad [place], MD;  Location: [place] [facility];  Service: Cardiovascular;  Laterality: Left;  . PSEUDOANEURYSM REPAIR Left 11/09/2015  . THROMBECTOMY / ARTERIOVENOUS GRAFT REVISION Left 08/18/2015   thigh  . THROMBECTOMY AND REVISION OF ARTERIOVENTOUS (AV) GORETEX  GRAFT Left 08/23/2015   Procedure: THROMBECTOMY AND REVISION OF ARTERIOVENTOUS (AV) GORETEX  GRAFT LEFT THIGH ;  Surgeon: [name], MD;  Location: [place];  Service: Vascular;  Laterality: Left;  . THROMBECTOMY W/ EMBOLECTOMY Left 08/18/2015   Procedure: THROMBECTOMY  AND REVISION ARTERIOVENOUS GORE-TEX GRAFT/LEFT THIGH;  Surgeon: [name], MD;  Location: [place];  Service: Vascular;  Laterality: Left;  . THROMBECTOMY W/ EMBOLECTOMY Right 06/19/2018   Procedure: THROMBECTOMY AND REVISION OF RIGHT THIGH  ARTERIOVENOUS GORE-TEX GRAFT;  Surgeon: [name], MD;  Location: [place];  Service: Vascular;  Laterality: Right;  . UPPER EXTREMITY VENOGRAPHY Bilateral 12/27/2016   Procedure: Upper Extremity Venography;  Surgeon: [name], MD;  Location: [place] [facility];  Service: Cardiovascular;  Laterality: Bilateral;  . VENOGRAM Left 05/05/2016   Procedure: Venogram;  Surgeon: Conrad [place], MD;  Location: [place] [facility];   Service: Cardiovascular;  Laterality: Left;  lower extremity       Family History  Problem Relation Age of Onset  . Renal Disease Neg Hx     Social History   Tobacco Use  . Smoking status: Former Smoker    Quit date: 03/21/1986    Years since quitting: 34.8  . Smokeless tobacco: Never Used  Substance Use Topics  . Alcohol use: No    Alcohol/week: 0.0 standard drinks  . Drug use: No    Home Medications Prior to Admission medications   Medication Sig Start Date End Date Taking? Authorizing Provider  acetaminophen (TYLENOL) 500 MG tablet Take 1,000 mg by mouth daily as needed (for pain or headaches).     [provider]  amLODipine (NORVASC) 10 MG tablet Take 10 mg by mouth daily.    [provider]  B Complex-C-Folic Acid (DIALYVITE TABLET) TABS Take 1 tablet by mouth daily. 06/29/20   [provider]  hydrALAZINE (APRESOLINE) 25 MG tablet Take 25 mg by mouth 3 (three) times daily. 06/10/20  [provider]  lidocaine-prilocaine (EMLA) cream Apply 1 application topically daily as needed (port access).  02/03/20   [provider]  predniSONE (DELTASONE) 50 MG tablet Take 1 tablet (50 mg total) by mouth daily. Patient not taking: Reported on [id number]   Delora Fuel, MD  TUMS ULTRA 1000 1000 MG chewable tablet Chew 4,000 mg by mouth 3 (three) times daily with meals. 03/14/20   [provider]  fluticasone (FLONASE) 50 MCG/ACT nasal spray Place 2 sprays into both nostrils daily. Patient not taking: Reported on 06/15/2018 07/25/17 02/20/20  Larene Pickett, PA-C    Allergies    Ivp dye [iodinated diagnostic agents], Lisinopril, and Solu-medrol [methylprednisolone]  Review of Systems   Review of Systems  Respiratory: Positive for cough.   All other systems reviewed and are negative.   Physical Exam Updated Vital Signs BP (!) 159/97   Pulse 85   Temp 98.1 F (36.7 C) (Oral)   Resp 17   Ht 6' (1.829 m)   Wt 73.5 kg    SpO2 100%   BMI 21.98 kg/m   Physical Exam Vitals and nursing note reviewed.   50 year old male, resting comfortably and in no acute distress. Vital signs are significant for elevated respiratory rate and blood pressure. Oxygen saturation is 98%, which is normal. Head is normocephalic and atraumatic. PERRLA, EOMI. Oropharynx is clear. Neck is nontender and supple without adenopathy or JVD. Back is nontender and there is no CVA tenderness. Lungs have faint bibasilar rales, prolonged exhalation phase.  No overt wheezes or rhonchi. Chest is nontender. Heart has regular rate and rhythm without murmur. Abdomen is soft, flat, nontender without masses or hepatosplenomegaly and peristalsis is normoactive. Extremities have 1+ edema, full range of motion is present.  AV shunt is present in the right thigh with thrill present. Skin is warm and dry without rash. Neurologic: Mental status is normal, cranial nerves are intact, there are no motor or sensory deficits.  ED Results / Procedures / Treatments   Labs (all labs ordered are listed, but only abnormal results are displayed) Labs Reviewed  RESP PANEL BY RT-PCR (FLU A&B, COVID) [id number]    EKG EKG Interpretation  Date/Time:  Sunday January 24 2021 21:05:15 EDT Ventricular Rate:  79 PR Interval:  170 QRS Duration: 86 QT Interval:  442 QTC Calculation: 506 R Axis:   21 Text Interpretation: Normal sinus rhythm with sinus arrhythmia Left ventricular hypertrophy with repolarization abnormality ( Sokolow-Lyon , Romhilt-Estes ) Prolonged QT Abnormal ECG When compared with ECG of 02/20/2020, QT has shortened Confirmed by Delora Fuel ([id number]) on 01/25/2021 12:58:16 AM   Radiology DG Chest 1 View  Result Date: 01/24/2021 CLINICAL DATA:  Sore throat and nonproductive cough x3 weeks EXAM: CHEST  1 VIEW COMPARISON:  Feb 20, 2020 FINDINGS: The heart size and mediastinal contours are within normal limits. Surgical clips overlie the thoracic inlet. Hazy  left basilar opacity with partial obscuration of the left hemidiaphragm. No significant pleural effusion or visible pneumothorax. The visualized skeletal structures are unremarkable. IMPRESSION: Hazy left basilar opacity with partial obscuration of the left hemidiaphragm could reflect atelectasis or infiltrate. Electronically Signed   By: Dahlia Bailiff MD   On: 01/24/2021 21:32    Procedures Procedures   Medications Ordered in ED Medications  ipratropium-albuterol (DUONEB) 0.5-2.5 (3) MG/3ML nebulizer solution 3 mL (has no administration in time range)  predniSONE (DELTASONE) tablet 60 mg (has no administration in time range)  albuterol (VENTOLIN HFA) 108 (90  Base) MCG/ACT inhaler 2 puff (2 puffs Inhalation Given 01/25/21 0135)    ED Course  I have reviewed the triage vital signs and the nursing notes.  Pertinent labs & imaging results that were available during my care of the patient were reviewed by me and considered in my medical decision making (see chart for details).    MDM Rules/Calculators/A&P                         Cough for 3weeks.  Chest x-ray is read by radiologist as atelectasis versus infiltrate.  I reviewed the images, and do not feel that they represent pneumonia.  He does have findings suggestive of bronchospasm on exam, will give therapeutic trial of albuterol, will check respiratory pathogen panel.  Old records are reviewed, he has no relevant past visits.  Respiratory pathogen panel is negative for influenza and COVID-19.  He had some temporary relief of symptoms with albuterol inhaler.  We will give prednisone and given nebulizer treatment with albuterol and ipratropium.  He noted significant improvement with nebulizer treatment.  He is discharged with prescriptions for prednisone and a small number of hydrocodone-acetaminophen tablets to use as cough suppressant if he is a unable to sleep because of cough.  He is advised to continue using albuterol inhaler as needed.   Return precautions discussed.  Final Clinical Impression(s) / ED Diagnoses Final diagnoses:  Acute bronchitis, unspecified organism    Rx / DC Orders ED Discharge Orders         Ordered    HYDROcodone-acetaminophen (NORCO) 5-325 MG tablet  Every 4 hours PRN,   Status:  Discontinued        01/25/21 0401    predniSONE (DELTASONE) 50 MG tablet  Daily        01/25/21 0401    HYDROcodone-acetaminophen (NORCO) 5-325 MG tablet  Every 4 hours PRN        01/25/21 [id number]           Delora Fuel, MD [id number] [phone]

## 2021-02-03 ENCOUNTER — Other Ambulatory Visit: Payer: Self-pay | Admitting: Interventional Radiology

## 2021-02-03 DIAGNOSIS — M7989 Other specified soft tissue disorders: Secondary | ICD-10-CM

## 2021-02-09 ENCOUNTER — Emergency Department (HOSPITAL_COMMUNITY): Payer: Self-pay

## 2021-02-09 ENCOUNTER — Emergency Department (HOSPITAL_COMMUNITY)
Admission: EM | Admit: 2021-02-09 | Discharge: 2021-02-09 | Disposition: A | Discharge status: Home / Self Care | Payer: Self-pay | Attending: Emergency Medicine | Admitting: Emergency Medicine

## 2021-02-09 DIAGNOSIS — Z87891 Personal history of nicotine dependence: Secondary | ICD-10-CM | POA: Insufficient documentation

## 2021-02-09 DIAGNOSIS — R0789 Other chest pain: Secondary | ICD-10-CM | POA: Insufficient documentation

## 2021-02-09 DIAGNOSIS — I120 Hypertensive chronic kidney disease with stage 5 chronic kidney disease or end stage renal disease: Secondary | ICD-10-CM | POA: Insufficient documentation

## 2021-02-09 DIAGNOSIS — Z79899 Other long term (current) drug therapy: Secondary | ICD-10-CM | POA: Insufficient documentation

## 2021-02-09 DIAGNOSIS — R0602 Shortness of breath: Secondary | ICD-10-CM | POA: Insufficient documentation

## 2021-02-09 DIAGNOSIS — R062 Wheezing: Secondary | ICD-10-CM | POA: Insufficient documentation

## 2021-02-09 DIAGNOSIS — Z992 Dependence on renal dialysis: Secondary | ICD-10-CM | POA: Insufficient documentation

## 2021-02-09 DIAGNOSIS — Z20822 Contact with and (suspected) exposure to covid-19: Secondary | ICD-10-CM | POA: Insufficient documentation

## 2021-02-09 DIAGNOSIS — R058 Other specified cough: Secondary | ICD-10-CM

## 2021-02-09 DIAGNOSIS — N186 End stage renal disease: Secondary | ICD-10-CM | POA: Insufficient documentation

## 2021-02-09 DIAGNOSIS — R059 Cough, unspecified: Secondary | ICD-10-CM | POA: Insufficient documentation

## 2021-02-09 LAB — RESP PANEL BY RT-PCR (FLU A&B, COVID) ARPGX2
Influenza A by PCR: NEGATIVE
Influenza B by PCR: NEGATIVE
SARS Coronavirus 2 by RT PCR: NEGATIVE

## 2021-02-09 MED ORDER — PREDNISONE 20 MG PO TABS: ORAL_TABLET | ORAL | 0 refills | Status: DC

## 2021-02-09 MED ORDER — ALBUTEROL SULFATE (2.5 MG/3ML) 0.083% IN NEBU
5.0000 mg | INHALATION_SOLUTION | Freq: Once | RESPIRATORY_TRACT | Status: AC
  Administered 2021-02-09: 5 mg via RESPIRATORY_TRACT
  Filled 2021-02-09: qty 6

## 2021-02-09 MED ORDER — BENZONATATE 100 MG PO CAPS: 100.0000 mg | ORAL_CAPSULE | Freq: Three times a day (TID) | ORAL | 0 refills | Status: DC

## 2021-02-09 MED ORDER — ALBUTEROL SULFATE HFA 108 (90 BASE) MCG/ACT IN AERS: 1.0000 | INHALATION_SPRAY | Freq: Four times a day (QID) | RESPIRATORY_TRACT | 0 refills | Status: DC | PRN

## 2021-02-09 MED ORDER — PREDNISONE 20 MG PO TABS
60.0000 mg | ORAL_TABLET | Freq: Once | ORAL | Status: AC
  Administered 2021-02-09: 60 mg via ORAL
  Filled 2021-02-09: qty 3

## 2021-02-09 NOTE — ED Triage Notes (Signed)
States was seen 2 weeks ago and dx w/ bronchitis. Returns saying he is getting no better and complaining of worsening SOB/cough.

## 2021-02-09 NOTE — Discharge Instructions (Addendum)
Take the prescribed medication as directed.   8 puffs of albuterol inhaler is equal to 1 neb treatment like you received in ED. You will be contacted if any abnormal results on viral test.  These will also upload into mychart. Follow-up with your primary care doctor. Return to the ED for new or worsening symptoms.

## 2021-02-09 NOTE — ED Notes (Signed)
Medications follow up appts reviewed w/ pt. Denies questions or concerns @ this time. Rx sent to verified pharmacy. Evaluated for cough/SOB. Immediately felt better after albuterol tx.  Education on s/s of worsening and when to return. Educated on [place] result. Tx for Left w/ even and steady gait. NAD noted. VSS

## 2021-02-09 NOTE — ED Notes (Signed)
Xray bedside.

## 2021-02-09 NOTE — ED Notes (Signed)
Receiving breathing tx. Laying in Biomedical scientist. VSS.

## 2021-02-09 NOTE — ED Provider Notes (Signed)
[facility] Provider Note   CSN: [id number] Arrival date & time: 02/09/21  0456     History Chief Complaint  Patient presents with  . Cough  . Shortness of Breath    [name] is a 50 y.o. male.  The history is provided by the patient and medical records.    50 year old male with history of anemia, anxiety, chronic dry cough, end-stage renal disease on hemodialysis, thyroid disease, presenting to the ED with continued cough.  Patient seen in ED 01/24/2021 for same and diagnosed with bronchitis.  He was prescribed medications and states he transiently got better but symptoms have once again worsened.  He denies any new sick contacts or COVID exposures.  He denies any fever.  He has not had any chest pain.  States he does feel some intermittent wheezing and sense of chest tightness.  He had full dialysis treatment yesterday without complication.  Past Medical History:  Diagnosis Date  . Anemia   . Anxiety   . Cough    DRY    . Depression   . ESRD on hemodialysis ([place])    HD [facility] MWF  . Headache(784.0)   . Hypertension   . Muscle spasms of neck    BACK, NECK  . Pneumonia 2015ish  . Thyroid disease     Patient Active Problem List   Diagnosis Date Noted  . Problem with dialysis access ([place]) 06/15/2018  . Hyperkalemia   . Problem with vascular access 10/07/2016  . Pseudoaneurysm of arteriovenous graft ([place]) 11/06/2015  . Complication from renal dialysis device   . ESRD (end stage renal disease) on dialysis ([place])   . Secondary hyperparathyroidism of renal origin ([place]) 04/24/2014  . Hyperparathyroidism, secondary ([place]) 03/21/2014    Past Surgical History:  Procedure Laterality Date  . ARTERIOVENOUS GRAFT PLACEMENT Left    "forearm, it's not working; thigh"   . ARTERIOVENOUS GRAFT PLACEMENT Left 11/09/2015  . AV FISTULA PLACEMENT Right 01/10/2017   Procedure: INSERTION OF ARTERIOVENOUS Right thigh GORE-TEX  GRAFT;  Surgeon: [name], MD;  Location: [facility];  Service: Vascular;  Laterality: Right;  . FALSE ANEURYSM REPAIR Left 11/09/2015   Procedure: REPAIR OF LEFT FEMORAL PSEUDOANEURYSM; REVISION  OF LEFT THIGH ARTERIOVENOUS GRAFT USING 6MM X 10 CM GORETEX GRAFT ;  Surgeon: [name], MD;  Location: [facility] OR;  Service: Vascular;  Laterality: Left;  . IR AV DIALY SHUNT INTRO NEEDLE/INTRACATH INITIAL W/PTA/IMG RIGHT Right 02/06/2019  . IR AV DIALY SHUNT INTRO NEEDLE/INTRACATH INITIAL W/PTA/IMG RIGHT Right 05/14/2019  . IR AV DIALY SHUNT INTRO NEEDLE/INTRACATH INITIAL W/PTA/IMG RIGHT Right 08/18/2020  . IR DIALY SHUNT INTRO NEEDLE/INTRACATH INITIAL W/IMG RIGHT Right 09/17/2019  . IR DIALY SHUNT INTRO NEEDLE/INTRACATH INITIAL W/IMG RIGHT Right 12/10/2019  . IR GENERIC HISTORICAL  07/15/2016   IR US GUIDE VASC ACCESS LEFT 07/15/2016 [name], MD [facility]  . IR GENERIC HISTORICAL Left 07/15/2016   IR THROMBECTOMY AV FISTULA W/THROMBOLYSIS/PTA INC/SHUNT/IMG LEFT 07/15/2016 [name], MD [facility]  . IR GENERIC HISTORICAL  10/05/2016   IR US GUIDE VASC ACCESS LEFT 10/05/2016 [name], MD [facility]  . IR GENERIC HISTORICAL Left 10/05/2016   IR THROMBECTOMY AV FISTULA W/THROMBOLYSIS/PTA INC/SHUNT/IMG LEFT 10/05/2016 [name], MD [facility]  . IR GENERIC HISTORICAL  10/08/2016   IR FLUORO GUIDE CV LINE RIGHT 10/08/2016 [name], MD [facility]  . IR GENERIC HISTORICAL  10/08/2016   IR US GUIDE VASC ACCESS  RIGHT 10/08/2016 [name], MD [facility]  . IR PTA AND STENT ADDL CENTRAL DIALY SEG THRU DIALY CIRCUIT RIGHT Right 12/10/2019  . IR RADIOLOGIST EVAL & MGMT  11/26/2019  . IR REMOVAL TUN CV CATH W/O FL  03/16/2017  . IR THROMBECTOMY AV FISTULA W/THROMBOLYSIS/PTA INC/SHUNT/IMG RIGHT Right 06/16/2018  . IR THROMBECTOMY AV FISTULA W/THROMBOLYSIS/PTA INC/SHUNT/IMG RIGHT Right 06/17/2018  . IR US GUIDE VASC ACCESS RIGHT  06/17/2018  . IR US GUIDE VASC ACCESS RIGHT  06/16/2018  . IR US  GUIDE VASC ACCESS RIGHT  02/06/2019  . IR US GUIDE VASC ACCESS RIGHT  05/14/2019  . IR US GUIDE VASC ACCESS RIGHT  09/17/2019  . IR US GUIDE VASC ACCESS RIGHT  12/10/2019  . IR US GUIDE VASC ACCESS RIGHT  08/18/2020  . PARATHYROIDECTOMY N/A 04/24/2014   Procedure: TOTAL 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;  Surgeon: [name], MD;  Location: [facility];  Service: General;  Laterality: N/A;  NECK AND LEFT FOREARM  . PERIPHERAL VASCULAR CATHETERIZATION N/A 05/05/2016   Procedure: A/V Shuntogram;  Surgeon: Conrad Hastings, MD;  Location: [facility] CV LAB;  Service: Cardiovascular;  Laterality: N/A;  . PERIPHERAL VASCULAR CATHETERIZATION Left 05/05/2016   Procedure: Peripheral Vascular Balloon Angioplasty;  Surgeon: Conrad Ball, MD;  Location: [facility] CV LAB;  Service: Cardiovascular;  Laterality: Left;  . PSEUDOANEURYSM REPAIR Left 11/09/2015  . THROMBECTOMY / ARTERIOVENOUS GRAFT REVISION Left 08/18/2015   thigh  . THROMBECTOMY AND REVISION OF ARTERIOVENTOUS (AV) GORETEX  GRAFT Left 08/23/2015   Procedure: THROMBECTOMY AND REVISION OF ARTERIOVENTOUS (AV) GORETEX  GRAFT LEFT THIGH ;  Surgeon: [name], MD;  Location: [facility];  Service: Vascular;  Laterality: Left;  . THROMBECTOMY W/ EMBOLECTOMY Left 08/18/2015   Procedure: THROMBECTOMY  AND REVISION ARTERIOVENOUS GORE-TEX GRAFT/LEFT THIGH;  Surgeon: [name], MD;  Location: [facility];  Service: Vascular;  Laterality: Left;  . THROMBECTOMY W/ EMBOLECTOMY Right 06/19/2018   Procedure: THROMBECTOMY AND REVISION OF RIGHT THIGH  ARTERIOVENOUS GORE-TEX GRAFT;  Surgeon: [name], MD;  Location: [facility];  Service: Vascular;  Laterality: Right;  . UPPER EXTREMITY VENOGRAPHY Bilateral 12/27/2016   Procedure: Upper Extremity Venography;  Surgeon: [name], MD;  Location: [facility];  Service: Cardiovascular;  Laterality: Bilateral;  . VENOGRAM Left 05/05/2016   Procedure: Venogram;  Surgeon: Conrad Ruston, MD;   Location: [facility] LAB;  Service: Cardiovascular;  Laterality: Left;  lower extremity       Family History  Problem Relation Age of Onset  . Renal Disease Neg Hx     Social History   Tobacco Use  . Smoking status: Former Smoker    Quit date: 03/21/1986    Years since quitting: 34.9  . Smokeless tobacco: Never Used  Substance Use Topics  . Alcohol use: No    Alcohol/week: 0.0 standard drinks  . Drug use: No    Home Medications Prior to Admission medications   Medication Sig Start Date End Date Taking? Authorizing Provider  acetaminophen (TYLENOL) 500 MG tablet Take 1,000 mg by mouth daily as needed (for pain or headaches).     [provider]  amLODipine (NORVASC) 10 MG tablet Take 10 mg by mouth daily.    [provider]  B Complex-C-Folic Acid (DIALYVITE TABLET) TABS Take 1 tablet by mouth daily. 06/29/20   [provider]  hydrALAZINE (APRESOLINE) 25 MG tablet Take 25 mg by mouth 3 (three) times daily.  06/10/20   [provider]  HYDROcodone-acetaminophen (NORCO) 5-325 MG tablet Take 1 tablet by mouth every 4 (four) hours as needed (cough). 99991111   Delora Fuel, MD  lidocaine-prilocaine (EMLA) cream Apply 1 application topically daily as needed (port access).  02/03/20   [provider]  predniSONE (DELTASONE) 50 MG tablet Take 1 tablet (50 mg total) by mouth daily. [id number]   Delora Fuel, MD  TUMS ULTRA 1000 1000 MG chewable tablet Chew 4,000 mg by mouth 3 (three) times daily with meals. 03/14/20   [provider]  fluticasone (FLONASE) 50 MCG/ACT nasal spray Place 2 sprays into both nostrils daily. Patient not taking: Reported on 06/15/2018 07/25/17 02/20/20  Larene Pickett, PA-C    Allergies    Ivp dye [iodinated diagnostic agents], Lisinopril, and Solu-medrol [methylprednisolone]  Review of Systems   Review of Systems  Respiratory: Positive for cough.   All other systems reviewed and are negative.   Physical  Exam Updated Vital Signs BP (!) 166/97 (BP Location: Left Arm)   Pulse 88   Temp 98.9 F (37.2 C) (Oral)   Resp (!) 26   Ht 6' (1.829 m)   Wt 73.5 kg   SpO2 100%   BMI 21.98 kg/m   Physical Exam Vitals and nursing note reviewed.  Constitutional:      Appearance: He is well-developed.  HENT:     Head: Normocephalic and atraumatic.  Eyes:     Conjunctiva/sclera: Conjunctivae normal.     Pupils: Pupils are equal, round, and reactive to light.  Cardiovascular:     Rate and Rhythm: Normal rate and regular rhythm.     Heart sounds: Normal heart sounds.  Pulmonary:     Effort: Pulmonary effort is normal.     Breath sounds: Normal breath sounds. No wheezing or rhonchi.     Comments: Dry, hacking cough noted on exam, he is in no acute distress, able to speak in full sentences without difficulty Abdominal:     General: Bowel sounds are normal.     Palpations: Abdomen is soft.  Musculoskeletal:        General: Normal range of motion.     Cervical back: Normal range of motion.  Skin:    General: Skin is warm and dry.  Neurological:     Mental Status: He is alert and oriented to person, place, and time.     ED Results / Procedures / Treatments   Labs (all labs ordered are listed, but only abnormal results are displayed) Labs Reviewed  RESP PANEL BY RT-PCR (FLU A&B, COVID) [id number]    EKG EKG Interpretation  Date/Time:  Tuesday Feb 09 2021 05:05:51 EDT Ventricular Rate:  91 PR Interval:  166 QRS Duration: 91 QT Interval:  401 QTC Calculation: 494 R Axis:   2 Text Interpretation: Sinus rhythm Left ventricular hypertrophy Nonspecific T abnormalities, lateral leads Borderline prolonged QT interval Confirmed by Octaviano Glow [phone]) on 02/09/2021 5:13:03 AM   Radiology DG Chest Port 1 View  Result Date: 02/09/2021 CLINICAL DATA:  Cough, shortness of breath. EXAM: PORTABLE CHEST 1 VIEW COMPARISON:  Chest x-ray 01/24/2021 FINDINGS: The heart size and mediastinal contours  are within normal limits. No focal consolidation. No pulmonary edema. No pleural effusion. No pneumothorax. No acute osseous abnormality.  Surgical clips overlie the neck. IMPRESSION: No active disease. Electronically Signed   By: Iven Finn M.D.   On: 02/09/2021 05:37    Procedures Procedures   Medications Ordered in ED Medications  albuterol (PROVENTIL) (2.5 MG/3ML) 0.083% nebulizer solution 5 mg (5 mg Nebulization Given 02/09/21 0524)  predniSONE (DELTASONE) tablet 60 mg (60 mg Oral Given 02/09/21 [id number])    ED Course  I have reviewed the triage vital signs and the nursing notes.  Pertinent labs & imaging results that were available during my care of the patient were reviewed by me and considered in my medical decision making (see chart for details).    MDM Rules/Calculators/A&P  50 y.o. M here with continued dry cough.  He chronically has history of same, seen here about 2 weeks ago and diagnosed with bronchitis.  States he got transiently better but worsened again today.  He reports some occasional wheezing and chest tightness.  On exam he is afebrile and nontoxic in appearance.  He is saturating well on room air and is not displaying any signs of respiratory distress.  Does have dry, hacking cough.  On chart review, patient does have longstanding dry cough for several years.  He has been worked up previously for this including CTs of the chest.  He is not currently on any type of ACE inhibitor.  We will repeat chest x-ray, COVID screen.  Give dose of prednisone and neb treatment as this seems to help his symptoms.  6:05 AM After neb treatment, states he feels significantly better.  His RR has decreased and continues saturating well on RA.  He is no longer coughing and seems to have better air movement.  CXR without findings of CAP or fluid overload.  Covid screen has been sent, he wants to be called with results.  Feel this is reasonable as I have low suspicion for covid-19 or flu.   Will re-start prednisone taper, tessalon for cough, albuterol PRN.  Encouraged close follow-up with PCP.  Return here for any new/acute changes.  Final Clinical Impression(s) / ED Diagnoses Final diagnoses:  Dry cough    Rx / DC Orders ED Discharge Orders         Ordered    predniSONE (DELTASONE) 20 MG tablet        02/09/21 0608    benzonatate (TESSALON) 100 MG capsule  Every 8 hours        02/09/21 0608    albuterol (VENTOLIN HFA) 108 (90 Base) MCG/ACT inhaler  Every 6 hours PRN        02/09/21 0608           Larene Pickett, PA-C 02/09/21 [id number]    Wyvonnia Dusky, MD 02/09/21 [phone]

## 2021-02-14 ENCOUNTER — Inpatient Hospital Stay (HOSPITAL_COMMUNITY)
Admission: EM | Admit: 2021-02-14 | Discharge: 2021-02-19 | DRG: 871 | Disposition: A | Discharge status: Home / Self Care | Payer: Medicare Other | Attending: Internal Medicine | Admitting: Internal Medicine

## 2021-02-14 ENCOUNTER — Other Ambulatory Visit: Payer: Self-pay

## 2021-02-14 ENCOUNTER — Emergency Department (HOSPITAL_COMMUNITY): Payer: Medicare Other

## 2021-02-14 DIAGNOSIS — N186 End stage renal disease: Secondary | ICD-10-CM | POA: Diagnosis not present

## 2021-02-14 DIAGNOSIS — Z87891 Personal history of nicotine dependence: Secondary | ICD-10-CM

## 2021-02-14 DIAGNOSIS — A419 Sepsis, unspecified organism: Secondary | ICD-10-CM

## 2021-02-14 DIAGNOSIS — I10 Essential (primary) hypertension: Secondary | ICD-10-CM | POA: Diagnosis present

## 2021-02-14 DIAGNOSIS — B181 Chronic viral hepatitis B without delta-agent: Secondary | ICD-10-CM | POA: Diagnosis present

## 2021-02-14 DIAGNOSIS — Z79899 Other long term (current) drug therapy: Secondary | ICD-10-CM

## 2021-02-14 DIAGNOSIS — R0902 Hypoxemia: Secondary | ICD-10-CM | POA: Diagnosis present

## 2021-02-14 DIAGNOSIS — A409 Streptococcal sepsis, unspecified: Principal | ICD-10-CM | POA: Diagnosis present

## 2021-02-14 DIAGNOSIS — B955 Unspecified streptococcus as the cause of diseases classified elsewhere: Secondary | ICD-10-CM

## 2021-02-14 DIAGNOSIS — Z888 Allergy status to other drugs, medicaments and biological substances status: Secondary | ICD-10-CM

## 2021-02-14 DIAGNOSIS — F419 Anxiety disorder, unspecified: Secondary | ICD-10-CM | POA: Diagnosis present

## 2021-02-14 DIAGNOSIS — Z20822 Contact with and (suspected) exposure to covid-19: Secondary | ICD-10-CM | POA: Diagnosis present

## 2021-02-14 DIAGNOSIS — D631 Anemia in chronic kidney disease: Secondary | ICD-10-CM | POA: Diagnosis present

## 2021-02-14 DIAGNOSIS — R509 Fever, unspecified: Secondary | ICD-10-CM

## 2021-02-14 DIAGNOSIS — B1910 Unspecified viral hepatitis B without hepatic coma: Secondary | ICD-10-CM | POA: Diagnosis present

## 2021-02-14 DIAGNOSIS — N289 Disorder of kidney and ureter, unspecified: Secondary | ICD-10-CM | POA: Diagnosis present

## 2021-02-14 DIAGNOSIS — K029 Dental caries, unspecified: Secondary | ICD-10-CM | POA: Diagnosis present

## 2021-02-14 DIAGNOSIS — Z91041 Radiographic dye allergy status: Secondary | ICD-10-CM

## 2021-02-14 DIAGNOSIS — I120 Hypertensive chronic kidney disease with stage 5 chronic kidney disease or end stage renal disease: Secondary | ICD-10-CM | POA: Diagnosis present

## 2021-02-14 DIAGNOSIS — F32A Depression, unspecified: Secondary | ICD-10-CM | POA: Diagnosis present

## 2021-02-14 DIAGNOSIS — J189 Pneumonia, unspecified organism: Secondary | ICD-10-CM | POA: Diagnosis present

## 2021-02-14 DIAGNOSIS — N2581 Secondary hyperparathyroidism of renal origin: Secondary | ICD-10-CM | POA: Diagnosis present

## 2021-02-14 DIAGNOSIS — R7881 Bacteremia: Secondary | ICD-10-CM | POA: Diagnosis present

## 2021-02-14 DIAGNOSIS — Z992 Dependence on renal dialysis: Secondary | ICD-10-CM

## 2021-02-14 LAB — COMPREHENSIVE METABOLIC PANEL
ALT: 16 U/L (ref 0–44)
AST: 15 U/L (ref 15–41)
Albumin: 3 g/dL — ABNORMAL LOW (ref 3.5–5.0)
Alkaline Phosphatase: 94 U/L (ref 38–126)
Anion gap: 18 — ABNORMAL HIGH (ref 5–15)
BUN: 59 mg/dL — ABNORMAL HIGH (ref 6–20)
CO2: 25 mmol/L (ref 22–32)
Calcium: 7.3 mg/dL — ABNORMAL LOW (ref 8.9–10.3)
Chloride: 95 mmol/L — ABNORMAL LOW (ref 98–111)
Creatinine, Ser: 13.73 mg/dL — ABNORMAL HIGH (ref 0.61–1.24)
GFR, Estimated: 4 mL/min — ABNORMAL LOW (ref >60)
Glucose, Bld: 84 mg/dL (ref 70–99)
Potassium: 4.9 mmol/L (ref 3.5–5.1)
Sodium: 138 mmol/L (ref 135–145)
Total Bilirubin: 1.4 mg/dL — ABNORMAL HIGH (ref 0.3–1.2)
Total Protein: 6.6 g/dL (ref 6.5–8.1)

## 2021-02-14 LAB — CBC WITH DIFFERENTIAL/PLATELET
Abs Immature Granulocytes: 0.04 10*3/uL (ref 0.00–0.07)
Basophils Absolute: 0 10*3/uL (ref 0.0–0.1)
Basophils Relative: 1 %
Eosinophils Absolute: 0 10*3/uL (ref 0.0–0.5)
Eosinophils Relative: 0 %
HCT: 31.7 % — ABNORMAL LOW (ref 39.0–52.0)
Hemoglobin: 10 g/dL — ABNORMAL LOW (ref 13.0–17.0)
Immature Granulocytes: 1 %
Lymphocytes Relative: 8 %
Lymphs Abs: 0.6 10*3/uL — ABNORMAL LOW (ref 0.7–4.0)
MCH: 29.8 pg (ref 26.0–34.0)
MCHC: 31.5 g/dL (ref 30.0–36.0)
MCV: 94.3 fL (ref 80.0–100.0)
Monocytes Absolute: 0.6 10*3/uL (ref 0.1–1.0)
Monocytes Relative: 7 %
Neutro Abs: 7 10*3/uL (ref 1.7–7.7)
Neutrophils Relative %: 83 %
Platelets: 265 10*3/uL (ref 150–400)
RBC: 3.36 MIL/uL — ABNORMAL LOW (ref 4.22–5.81)
RDW: 16.8 % — ABNORMAL HIGH (ref 11.5–15.5)
WBC: 8.3 10*3/uL (ref 4.0–10.5)
nRBC: 0 % (ref 0.0–0.2)

## 2021-02-14 LAB — RESP PANEL BY RT-PCR (FLU A&B, COVID) ARPGX2
Influenza A by PCR: NEGATIVE
Influenza B by PCR: NEGATIVE
SARS Coronavirus 2 by RT PCR: NEGATIVE

## 2021-02-14 LAB — LACTIC ACID, PLASMA: Lactic Acid, Venous: 1.2 mmol/L (ref 0.5–1.9)

## 2021-02-14 MED ORDER — ACETAMINOPHEN 325 MG PO TABS
650.0000 mg | ORAL_TABLET | Freq: Once | ORAL | Status: AC | PRN
  Administered 2021-02-14: 650 mg via ORAL
  Filled 2021-02-14: qty 2

## 2021-02-14 NOTE — ED Triage Notes (Signed)
Patient reports fever, nausea, body aches x 1 week, recently tx for bronchitis, states he finished abx

## 2021-02-15 ENCOUNTER — Emergency Department (HOSPITAL_COMMUNITY): Payer: Medicare Other

## 2021-02-15 ENCOUNTER — Encounter (HOSPITAL_COMMUNITY): Payer: Self-pay | Admitting: Internal Medicine

## 2021-02-15 DIAGNOSIS — A409 Streptococcal sepsis, unspecified: Secondary | ICD-10-CM | POA: Diagnosis present

## 2021-02-15 DIAGNOSIS — D631 Anemia in chronic kidney disease: Secondary | ICD-10-CM | POA: Diagnosis present

## 2021-02-15 DIAGNOSIS — K029 Dental caries, unspecified: Secondary | ICD-10-CM | POA: Diagnosis present

## 2021-02-15 DIAGNOSIS — Z992 Dependence on renal dialysis: Secondary | ICD-10-CM | POA: Diagnosis not present

## 2021-02-15 DIAGNOSIS — A419 Sepsis, unspecified organism: Secondary | ICD-10-CM

## 2021-02-15 DIAGNOSIS — R7881 Bacteremia: Secondary | ICD-10-CM | POA: Diagnosis present

## 2021-02-15 DIAGNOSIS — B181 Chronic viral hepatitis B without delta-agent: Secondary | ICD-10-CM | POA: Diagnosis present

## 2021-02-15 DIAGNOSIS — B1910 Unspecified viral hepatitis B without hepatic coma: Secondary | ICD-10-CM | POA: Diagnosis present

## 2021-02-15 DIAGNOSIS — I120 Hypertensive chronic kidney disease with stage 5 chronic kidney disease or end stage renal disease: Secondary | ICD-10-CM | POA: Diagnosis present

## 2021-02-15 DIAGNOSIS — N289 Disorder of kidney and ureter, unspecified: Secondary | ICD-10-CM | POA: Diagnosis present

## 2021-02-15 DIAGNOSIS — F419 Anxiety disorder, unspecified: Secondary | ICD-10-CM | POA: Diagnosis present

## 2021-02-15 DIAGNOSIS — Z91041 Radiographic dye allergy status: Secondary | ICD-10-CM | POA: Diagnosis not present

## 2021-02-15 DIAGNOSIS — J189 Pneumonia, unspecified organism: Secondary | ICD-10-CM

## 2021-02-15 DIAGNOSIS — R609 Edema, unspecified: Secondary | ICD-10-CM

## 2021-02-15 DIAGNOSIS — Z79899 Other long term (current) drug therapy: Secondary | ICD-10-CM | POA: Diagnosis not present

## 2021-02-15 DIAGNOSIS — Z87891 Personal history of nicotine dependence: Secondary | ICD-10-CM | POA: Diagnosis not present

## 2021-02-15 DIAGNOSIS — F32A Depression, unspecified: Secondary | ICD-10-CM | POA: Diagnosis present

## 2021-02-15 DIAGNOSIS — N186 End stage renal disease: Secondary | ICD-10-CM | POA: Diagnosis present

## 2021-02-15 DIAGNOSIS — Z888 Allergy status to other drugs, medicaments and biological substances status: Secondary | ICD-10-CM | POA: Diagnosis not present

## 2021-02-15 DIAGNOSIS — N2581 Secondary hyperparathyroidism of renal origin: Secondary | ICD-10-CM | POA: Diagnosis present

## 2021-02-15 DIAGNOSIS — R0902 Hypoxemia: Secondary | ICD-10-CM | POA: Diagnosis present

## 2021-02-15 DIAGNOSIS — Z20822 Contact with and (suspected) exposure to covid-19: Secondary | ICD-10-CM | POA: Diagnosis present

## 2021-02-15 DIAGNOSIS — B955 Unspecified streptococcus as the cause of diseases classified elsewhere: Secondary | ICD-10-CM | POA: Diagnosis present

## 2021-02-15 LAB — RESPIRATORY PANEL BY PCR

## 2021-02-15 LAB — TROPONIN I (HIGH SENSITIVITY)
Troponin I (High Sensitivity): 77 ng/L — ABNORMAL HIGH (ref <18)
Troponin I (High Sensitivity): 80 ng/L — ABNORMAL HIGH (ref <18)

## 2021-02-15 LAB — SEDIMENTATION RATE: Sed Rate: 20 mm/hr — ABNORMAL HIGH (ref 0–16)

## 2021-02-15 LAB — RAPID HIV SCREEN (HIV 1/2 AB+AG)
HIV 1/2 Antibodies: NONREACTIVE
HIV-1 P24 Antigen - HIV24: NONREACTIVE

## 2021-02-15 LAB — LACTIC ACID, PLASMA: Lactic Acid, Venous: 1.5 mmol/L (ref 0.5–1.9)

## 2021-02-15 LAB — PROCALCITONIN: Procalcitonin: 8.64 ng/mL

## 2021-02-15 LAB — C-REACTIVE PROTEIN: CRP: 14.3 mg/dL — ABNORMAL HIGH (ref <1.0)

## 2021-02-15 MED ORDER — SODIUM CHLORIDE 0.9 % IV SOLN
2.0000 g | INTRAVENOUS | Status: DC
  Administered 2021-02-15 – 2021-02-16 (×2): 2 g via INTRAVENOUS
  Filled 2021-02-15: qty 2
  Filled 2021-02-15 (×2): qty 20

## 2021-02-15 MED ORDER — RENA-VITE PO TABS
1.0000 | ORAL_TABLET | Freq: Every day | ORAL | Status: DC
  Administered 2021-02-15 – 2021-02-18 (×4): 1 via ORAL
  Filled 2021-02-15 (×5): qty 1

## 2021-02-15 MED ORDER — SORBITOL 70 % SOLN
30.0000 mL | Status: DC | PRN
  Filled 2021-02-15: qty 30

## 2021-02-15 MED ORDER — LACTATED RINGERS IV SOLN: INTRAVENOUS | Status: DC

## 2021-02-15 MED ORDER — SODIUM CHLORIDE 0.9% FLUSH
3.0000 mL | Freq: Two times a day (BID) | INTRAVENOUS | Status: DC
  Administered 2021-02-15 – 2021-02-18 (×8): 3 mL via INTRAVENOUS

## 2021-02-15 MED ORDER — BENZONATATE 100 MG PO CAPS
100.0000 mg | ORAL_CAPSULE | Freq: Three times a day (TID) | ORAL | Status: DC
  Administered 2021-02-15 – 2021-02-19 (×13): 100 mg via ORAL
  Filled 2021-02-15 (×13): qty 1

## 2021-02-15 MED ORDER — AZITHROMYCIN 250 MG PO TABS: 250.0000 mg | ORAL_TABLET | Freq: Every day | ORAL | 0 refills | Status: DC

## 2021-02-15 MED ORDER — ZOLPIDEM TARTRATE 5 MG PO TABS
5.0000 mg | ORAL_TABLET | Freq: Every evening | ORAL | Status: DC | PRN
  Administered 2021-02-18: 5 mg via ORAL
  Filled 2021-02-15 (×2): qty 1

## 2021-02-15 MED ORDER — AMLODIPINE BESYLATE 10 MG PO TABS
10.0000 mg | ORAL_TABLET | Freq: Every day | ORAL | Status: DC
  Administered 2021-02-15 – 2021-02-19 (×5): 10 mg via ORAL
  Filled 2021-02-15 (×2): qty 1
  Filled 2021-02-15: qty 2
  Filled 2021-02-15 (×2): qty 1

## 2021-02-15 MED ORDER — ACETAMINOPHEN 325 MG PO TABS
650.0000 mg | ORAL_TABLET | Freq: Four times a day (QID) | ORAL | Status: DC | PRN
  Administered 2021-02-15 – 2021-02-18 (×5): 650 mg via ORAL
  Filled 2021-02-15 (×5): qty 2

## 2021-02-15 MED ORDER — HEPARIN SODIUM (PORCINE) 5000 UNIT/ML IJ SOLN
5000.0000 [IU] | Freq: Three times a day (TID) | INTRAMUSCULAR | Status: DC
  Administered 2021-02-15 – 2021-02-16 (×3): 5000 [IU] via SUBCUTANEOUS
  Filled 2021-02-15 (×6): qty 1

## 2021-02-15 MED ORDER — HYDROCODONE-ACETAMINOPHEN 5-325 MG PO TABS
1.0000 | ORAL_TABLET | ORAL | Status: DC | PRN
  Administered 2021-02-18: 1 via ORAL
  Filled 2021-02-15: qty 1

## 2021-02-15 MED ORDER — ONDANSETRON HCL 4 MG PO TABS: 4.0000 mg | ORAL_TABLET | Freq: Four times a day (QID) | ORAL | Status: DC | PRN

## 2021-02-15 MED ORDER — NEPRO/CARBSTEADY PO LIQD
237.0000 mL | Freq: Three times a day (TID) | ORAL | Status: DC | PRN
  Filled 2021-02-15: qty 237

## 2021-02-15 MED ORDER — CAMPHOR-MENTHOL 0.5-0.5 % EX LOTN
1.0000 "application " | TOPICAL_LOTION | Freq: Three times a day (TID) | CUTANEOUS | Status: DC | PRN
  Filled 2021-02-15: qty 222

## 2021-02-15 MED ORDER — HYDRALAZINE HCL 50 MG PO TABS
50.0000 mg | ORAL_TABLET | Freq: Three times a day (TID) | ORAL | Status: DC
  Administered 2021-02-15 – 2021-02-19 (×13): 50 mg via ORAL
  Filled 2021-02-15 (×9): qty 1
  Filled 2021-02-15: qty 2
  Filled 2021-02-15 (×3): qty 1

## 2021-02-15 MED ORDER — CHLORHEXIDINE GLUCONATE CLOTH 2 % EX PADS
6.0000 | MEDICATED_PAD | Freq: Every day | CUTANEOUS | Status: DC
  Administered 2021-02-18: 6 via TOPICAL

## 2021-02-15 MED ORDER — VANCOMYCIN HCL 1500 MG/300ML IV SOLN
1500.0000 mg | Freq: Once | INTRAVENOUS | Status: AC
  Administered 2021-02-15: 1500 mg via INTRAVENOUS
  Filled 2021-02-15: qty 300

## 2021-02-15 MED ORDER — HYDROXYZINE HCL 25 MG PO TABS: 25.0000 mg | ORAL_TABLET | Freq: Three times a day (TID) | ORAL | Status: DC | PRN

## 2021-02-15 MED ORDER — DOCUSATE SODIUM 283 MG RE ENEM
1.0000 | ENEMA | RECTAL | Status: DC | PRN
  Filled 2021-02-15: qty 1

## 2021-02-15 MED ORDER — ACETAMINOPHEN 650 MG RE SUPP: 650.0000 mg | Freq: Four times a day (QID) | RECTAL | Status: DC | PRN

## 2021-02-15 MED ORDER — ACETAMINOPHEN 500 MG PO TABS
1000.0000 mg | ORAL_TABLET | Freq: Once | ORAL | Status: AC
  Administered 2021-02-15: 1000 mg via ORAL
  Filled 2021-02-15: qty 2

## 2021-02-15 MED ORDER — AZITHROMYCIN 250 MG PO TABS
500.0000 mg | ORAL_TABLET | Freq: Once | ORAL | Status: AC
  Administered 2021-02-15: 500 mg via ORAL
  Filled 2021-02-15: qty 2

## 2021-02-15 MED ORDER — ONDANSETRON HCL 4 MG/2ML IJ SOLN: 4.0000 mg | Freq: Four times a day (QID) | INTRAMUSCULAR | Status: DC | PRN

## 2021-02-15 MED ORDER — CALCIUM CARBONATE ANTACID 1250 MG/5ML PO SUSP
500.0000 mg | Freq: Four times a day (QID) | ORAL | Status: DC | PRN
  Filled 2021-02-15: qty 5

## 2021-02-15 MED ORDER — VANCOMYCIN HCL 1000 MG/200ML IV SOLN: 1000.0000 mg | Freq: Once | INTRAVENOUS | Status: DC

## 2021-02-15 MED ORDER — SODIUM CHLORIDE 0.9 % IV SOLN
500.0000 mg | INTRAVENOUS | Status: DC
  Filled 2021-02-15: qty 500

## 2021-02-15 MED ORDER — ALBUTEROL SULFATE HFA 108 (90 BASE) MCG/ACT IN AERS: 1.0000 | INHALATION_SPRAY | Freq: Four times a day (QID) | RESPIRATORY_TRACT | Status: DC | PRN

## 2021-02-15 NOTE — H&P (Signed)
History and Physical    [name] [id number] DOB: 10-09-1970 DOA: 02/14/2021  PCP: Patient, No Pcp Per (Inactive) Consultants:  Nephrology Patient coming from: Lives alone; NOK: Martie Round, [phone]  Chief Complaint: Fever  HPI: [name] is a 50 y.o. male with medical history significant of hypothyroidism; HTN; depression/anxiety; and ESRD on MWF HD presenting with fever.  He reports that he has had fever, chills, cough for 2 weeks.  This is his third ER visit - first time, bronchitis and gave steroids but no antibiotics.  The second time he wasn't given anything other than cough medicine.  Fever up to 102.  Cough is productive of white sputum.  No sick contacts.  No GI symptoms.  No rash.  Similar issue several years ago, treated with antibiotics.    ED Course:  Sepsis from respiratory infection - 3 weeks of cough, bronchitis.  Treated with prednisone but no antibiotics to date.  CT with patchy, atypical infection.  COVID negative multiple times and today.  Respiratory panel pending.  Nephrology will see.  BP elevated.  +tachypnea, looks sick.  Hypoxia, on [place] O2.  Review of Systems: As per HPI; otherwise review of systems reviewed and negative.   Ambulatory Status:  Ambulates without assistance  COVID Vaccine Status:  Complete, no booster  Past Medical History:  Diagnosis Date  . Anemia   . Anxiety   . Cough    DRY    . Depression   . ESRD on hemodialysis ([place])    HD [place] MWF  . Headache(784.0)   . Hypertension   . Muscle spasms of neck    BACK, NECK  . Pneumonia 2015ish  . Thyroid disease     Past Surgical History:  Procedure Laterality Date  . ARTERIOVENOUS GRAFT PLACEMENT Left    "forearm, it's not working; thigh"   . ARTERIOVENOUS GRAFT PLACEMENT Left 11/09/2015  . AV FISTULA PLACEMENT Right 01/10/2017   Procedure: INSERTION OF ARTERIOVENOUS Right thigh GORE-TEX GRAFT;  Surgeon: [name], MD;   Location: [place];  Service: Vascular;  Laterality: Right;  . FALSE ANEURYSM REPAIR Left 11/09/2015   Procedure: REPAIR OF LEFT FEMORAL PSEUDOANEURYSM; REVISION  OF LEFT THIGH ARTERIOVENOUS GRAFT USING 6MM X 10 CM GORETEX GRAFT ;  Surgeon: [name], MD;  Location: [facility] OR;  Service: Vascular;  Laterality: Left;  . IR AV DIALY SHUNT INTRO NEEDLE/INTRACATH INITIAL W/PTA/IMG RIGHT Right 02/06/2019  . IR AV DIALY SHUNT INTRO NEEDLE/INTRACATH INITIAL W/PTA/IMG RIGHT Right 05/14/2019  . IR AV DIALY SHUNT INTRO NEEDLE/INTRACATH INITIAL W/PTA/IMG RIGHT Right 08/18/2020  . IR DIALY SHUNT INTRO NEEDLE/INTRACATH INITIAL W/IMG RIGHT Right 09/17/2019  . IR DIALY SHUNT INTRO NEEDLE/INTRACATH INITIAL W/IMG RIGHT Right 12/10/2019  . IR GENERIC HISTORICAL  07/15/2016   IR US GUIDE VASC ACCESS LEFT 07/15/2016 [name], MD [facility]  . IR GENERIC HISTORICAL Left 07/15/2016   IR THROMBECTOMY AV FISTULA W/THROMBOLYSIS/PTA INC/SHUNT/IMG LEFT 07/15/2016 [name], MD [facility]  . IR GENERIC HISTORICAL  10/05/2016   IR US GUIDE VASC ACCESS LEFT 10/05/2016 [name], MD [facility]  . IR GENERIC HISTORICAL Left 10/05/2016   IR THROMBECTOMY AV FISTULA W/THROMBOLYSIS/PTA INC/SHUNT/IMG LEFT 10/05/2016 [name], MD [facility]  . IR GENERIC HISTORICAL  10/08/2016   IR FLUORO GUIDE CV LINE RIGHT 10/08/2016 [name], MD [facility]  . IR GENERIC HISTORICAL  10/08/2016   IR US GUIDE VASC ACCESS RIGHT 10/08/2016 [name], MD [facility]  .  IR PTA AND STENT ADDL CENTRAL DIALY SEG THRU DIALY CIRCUIT RIGHT Right 12/10/2019  . IR RADIOLOGIST EVAL & MGMT  11/26/2019  . IR REMOVAL TUN CV CATH W/O FL  03/16/2017  . IR THROMBECTOMY AV FISTULA W/THROMBOLYSIS/PTA INC/SHUNT/IMG RIGHT Right 06/16/2018  . IR THROMBECTOMY AV FISTULA W/THROMBOLYSIS/PTA INC/SHUNT/IMG RIGHT Right 06/17/2018  . IR US GUIDE VASC ACCESS RIGHT  06/17/2018  . IR US GUIDE VASC ACCESS RIGHT  06/16/2018  . IR US GUIDE VASC ACCESS RIGHT  02/06/2019  . IR US  GUIDE VASC ACCESS RIGHT  05/14/2019  . IR US GUIDE VASC ACCESS RIGHT  09/17/2019  . IR US GUIDE VASC ACCESS RIGHT  12/10/2019  . IR US GUIDE VASC ACCESS RIGHT  08/18/2020  . PARATHYROIDECTOMY N/A 04/24/2014   Procedure: TOTAL 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;  Surgeon: [name], MD;  Location: [place];  Service: General;  Laterality: N/A;  NECK AND LEFT FOREARM  . PERIPHERAL VASCULAR CATHETERIZATION N/A 05/05/2016   Procedure: A/V Shuntogram;  Surgeon: Conrad Broadlands, MD;  Location: [place] CV LAB;  Service: Cardiovascular;  Laterality: N/A;  . PERIPHERAL VASCULAR CATHETERIZATION Left 05/05/2016   Procedure: Peripheral Vascular Balloon Angioplasty;  Surgeon: Conrad Zion, MD;  Location: [place] CV LAB;  Service: Cardiovascular;  Laterality: Left;  . PSEUDOANEURYSM REPAIR Left 11/09/2015  . THROMBECTOMY / ARTERIOVENOUS GRAFT REVISION Left 08/18/2015   thigh  . THROMBECTOMY AND REVISION OF ARTERIOVENTOUS (AV) GORETEX  GRAFT Left 08/23/2015   Procedure: THROMBECTOMY AND REVISION OF ARTERIOVENTOUS (AV) GORETEX  GRAFT LEFT THIGH ;  Surgeon: [name], MD;  Location: [place];  Service: Vascular;  Laterality: Left;  . THROMBECTOMY W/ EMBOLECTOMY Left 08/18/2015   Procedure: THROMBECTOMY  AND REVISION ARTERIOVENOUS GORE-TEX GRAFT/LEFT THIGH;  Surgeon: [name], MD;  Location: [place];  Service: Vascular;  Laterality: Left;  . THROMBECTOMY W/ EMBOLECTOMY Right 06/19/2018   Procedure: THROMBECTOMY AND REVISION OF RIGHT THIGH  ARTERIOVENOUS GORE-TEX GRAFT;  Surgeon: [name], MD;  Location: [place];  Service: Vascular;  Laterality: Right;  . UPPER EXTREMITY VENOGRAPHY Bilateral 12/27/2016   Procedure: Upper Extremity Venography;  Surgeon: [name], MD;  Location: [place] CV LAB;  Service: Cardiovascular;  Laterality: Bilateral;  . VENOGRAM Left 05/05/2016   Procedure: Venogram;  Surgeon: Conrad Jagual, MD;  Location: [place] [facility] LAB;  Service:  Cardiovascular;  Laterality: Left;  lower extremity    Social History   Socioeconomic History  . Marital status: Married    Spouse name: Not on file  . Number of children: Not on file  . Years of education: Not on file  . Highest education level: Not on file  Occupational History  . Not on file  Tobacco Use  . Smoking status: Former Smoker    Quit date: 03/21/1986    Years since quitting: 34.9  . Smokeless tobacco: Never Used  Substance and Sexual Activity  . Alcohol use: No    Alcohol/week: 0.0 standard drinks  . Drug use: No  . Sexual activity: Not on file  Other Topics Concern  . Not on file  Social History Narrative  . Not on file   Social Determinants of Health   Financial Resource Strain: Not on file  Food Insecurity: Not on file  Transportation Needs: Not on file  Physical Activity: Not on file  Stress: Not on file  Social Connections: Not on file  Intimate Partner Violence: Not on file  Allergies  Allergen Reactions  . Ivp Dye [Iodinated Diagnostic Agents] Swelling    SWELLING REACTION UNSPECIFIED   . Lisinopril Cough  . Solu-Medrol [Methylprednisolone] Nausea And Vomiting    Family History  Problem Relation Age of Onset  . Renal Disease Neg Hx     Prior to Admission medications   Medication Sig Start Date End Date Taking? Authorizing Provider  acetaminophen (TYLENOL) 500 MG tablet Take 1,000 mg by mouth daily as needed (for pain or headaches).    Yes [provider]  albuterol (VENTOLIN HFA) 108 (90 Base) MCG/ACT inhaler Inhale 1-2 puffs into the lungs every 6 (six) hours as needed for wheezing. 02/09/21  Yes Larene Pickett, PA-C  amLODipine (NORVASC) 10 MG tablet Take 10 mg by mouth daily.   Yes [provider]  azithromycin (ZITHROMAX) 250 MG tablet Take 1 tablet (250 mg total) by mouth daily. Take first 2 tablets together, then 1 every day until finished. 02/15/21  Yes Rancour, Annie Main, MD  B Complex-C-Folic Acid (DIALYVITE  TABLET) TABS Take 1 tablet by mouth daily. 06/29/20  Yes [provider]  benzonatate (TESSALON) 100 MG capsule Take 1 capsule (100 mg total) by mouth every 8 (eight) hours. 02/09/21  Yes Larene Pickett, PA-C  hydrALAZINE (APRESOLINE) 50 MG tablet Take 50 mg by mouth 3 (three) times daily. 11/04/20  Yes [provider]  HYDROcodone-acetaminophen (NORCO) 5-325 MG tablet Take 1 tablet by mouth every 4 (four) hours as needed (cough). Patient taking differently: Take 1 tablet by mouth every 4 (four) hours as needed for moderate pain (cough). [id number]  Yes Delora Fuel, MD  lidocaine-prilocaine (EMLA) cream Apply 1 application topically daily as needed (port access).  02/03/20  Yes [provider]  TUMS ULTRA 1000 1000 MG chewable tablet Chew 4,000 mg by mouth 3 (three) times daily with meals. 03/14/20  Yes [provider]  predniSONE (DELTASONE) 20 MG tablet Take 40 mg by mouth daily for 3 days, then '20mg'$  by mouth daily for 3 days, then '10mg'$  daily for 3 days Patient not taking: No sig reported 02/09/21   Larene Pickett, PA-C  fluticasone [facility]) 50 MCG/ACT nasal spray Place 2 sprays into both nostrils daily. Patient not taking: Reported on 06/15/2018 07/25/17 02/20/20  Larene Pickett, PA-C    Physical Exam: Vitals:   02/15/21 1030 02/15/21 1100 02/15/21 1115 02/15/21 1118  BP: (!) 147/98 (!) 153/90 (!) 163/94   Pulse: 99 100 (!) 103   Resp: (!) 26 (!) 32 (!) 32   Temp:    (!) 100.9 F (38.3 C)  TempSrc:    Oral  SpO2: 92% 95% 95%   Weight:      Height:         . General:  Appears calm and comfortable and is in NAD . Eyes:  PERRL, EOMI, normal lids, iris . ENT:  grossly normal hearing, lips & tongue, mmm . Neck:  no LAD, masses or thyromegaly . Cardiovascular:  RR with mild tachycardia, no m/r/g. No LE edema.  Marland Kitchen Respiratory:   CTA bilaterally with no wheezes/rales/rhonchi.  Mildly increased respiratory effort. . Abdomen:  soft, NT, ND . Back:   normal  alignment, no CVAT . Skin:  no rash or induration seen on limited exam . Musculoskeletal:  grossly normal tone BUE/BLE, good ROM, no bony abnormality . Lower extremity:  No LE edema.  Limited foot exam with no ulcerations.  2+ distal pulses. Marland Kitchen Psychiatric:  grossly normal mood and affect,  speech fluent and appropriate, AOx3 . Neurologic:  CN 2-12 grossly intact, moves all extremities in coordinated fashion    Radiological Exams on Admission: Independently reviewed - see discussion in A/P where applicable  CT ABDOMEN PELVIS WO CONTRAST  Result Date: 02/15/2021 CLINICAL DATA:  Fever of unknown origin EXAM: CT CHEST, ABDOMEN AND PELVIS WITHOUT CONTRAST TECHNIQUE: Multidetector CT imaging of the chest, abdomen and pelvis was performed following the standard protocol without IV contrast. COMPARISON:  12/13/2016 abdominal CT FINDINGS: CT CHEST FINDINGS Cardiovascular: Normal heart size. Border. Calcified fibrin sheath at the left brachiocephalic and right brachiocephalic veins, extending into the SVC. These vessels appear small caliber in places. Atheromatous calcification. The ascending aorta is 4 cm in diameter. Mediastinum/Nodes: No adenopathy or mass.  Parathyroidectomy changes Lungs/Pleura: Mild patchy subpleural opacity at the bases. These are small and more flat appearing than would be expected for infarct. Mild fissure and interlobular septal thickening at the bases. Musculoskeletal: No acute finding. CT ABDOMEN PELVIS FINDINGS Hepatobiliary: No focal liver abnormality.No evidence of biliary obstruction or stone. Pancreas: Unremarkable. Spleen: Unremarkable. Adrenals/Urinary Tract: Negative adrenals. Polycystic kidneys with atrophic intervening parenchyma. Multiple cysts with mural calcifications. There also a few high-density proteinaceous appearing lesion/cysts. There is a notable low-density lesion in the left lower kidney measuring 3.7 cm with hazy or margins and internal isodensity. No  hydronephrosis. Negative collapsed bladder. Stomach/Bowel:  No obstruction. No visible bowel inflammation Vascular/Lymphatic: Diffuse arterial calcification. Dialysis access at the left thigh with a chronic collection that has decreased in size and increase in peripheral densities since 2018, deforming the adjacent emanating grafts. Grafts have been added to the right thigh since prior. Right iliac vein stenting. No mass or adenopathy. Reproductive:No pathologic findings. Other: No ascites or pneumoperitoneum. Musculoskeletal: Renal osteodystrophy.  No acute finding IMPRESSION: 1. Mild patchy bilateral airspace disease suspicious for atypical pneumonia. 2. Polycystic kidneys. Notable indistinct left renal cyst/lesion which may be related to recent intracystic hemorrhage or infection in this setting. 3. Cardiomegaly and trace pulmonary edema. 4. Chronic findings are described above. Electronically Signed   By: Monte Fantasia M.D.   On: 02/15/2021 07:17   DG Chest 2 View  Result Date: 02/14/2021 CLINICAL DATA:  Shortness of breath EXAM: CHEST - 2 VIEW COMPARISON:  Feb 09, 2021 FINDINGS: Mild cardiac enlargement, similar prior. No focal consolidation. No pleural effusion. No pneumothorax. Surgical clips overlie the neck. The visualized skeletal structures are unremarkable. IMPRESSION: No acute cardiopulmonary disease. Electronically Signed   By: Dahlia Bailiff MD   On: 02/14/2021 22:28   CT Chest Wo Contrast  Result Date: 02/15/2021 CLINICAL DATA:  Fever of unknown origin EXAM: CT CHEST, ABDOMEN AND PELVIS WITHOUT CONTRAST TECHNIQUE: Multidetector CT imaging of the chest, abdomen and pelvis was performed following the standard protocol without IV contrast. COMPARISON:  12/13/2016 abdominal CT FINDINGS: CT CHEST FINDINGS Cardiovascular: Normal heart size. Border. Calcified fibrin sheath at the left brachiocephalic and right brachiocephalic veins, extending into the SVC. These vessels appear small caliber in  places. Atheromatous calcification. The ascending aorta is 4 cm in diameter. Mediastinum/Nodes: No adenopathy or mass.  Parathyroidectomy changes Lungs/Pleura: Mild patchy subpleural opacity at the bases. These are small and more flat appearing than would be expected for infarct. Mild fissure and interlobular septal thickening at the bases. Musculoskeletal: No acute finding. CT ABDOMEN PELVIS FINDINGS Hepatobiliary: No focal liver abnormality.No evidence of biliary obstruction or stone. Pancreas: Unremarkable. Spleen: Unremarkable. Adrenals/Urinary Tract: Negative adrenals. Polycystic kidneys with atrophic intervening parenchyma. Multiple cysts with  mural calcifications. There also a few high-density proteinaceous appearing lesion/cysts. There is a notable low-density lesion in the left lower kidney measuring 3.7 cm with hazy or margins and internal isodensity. No hydronephrosis. Negative collapsed bladder. Stomach/Bowel:  No obstruction. No visible bowel inflammation Vascular/Lymphatic: Diffuse arterial calcification. Dialysis access at the left thigh with a chronic collection that has decreased in size and increase in peripheral densities since 2018, deforming the adjacent emanating grafts. Grafts have been added to the right thigh since prior. Right iliac vein stenting. No mass or adenopathy. Reproductive:No pathologic findings. Other: No ascites or pneumoperitoneum. Musculoskeletal: Renal osteodystrophy.  No acute finding IMPRESSION: 1. Mild patchy bilateral airspace disease suspicious for atypical pneumonia. 2. Polycystic kidneys. Notable indistinct left renal cyst/lesion which may be related to recent intracystic hemorrhage or infection in this setting. 3. Cardiomegaly and trace pulmonary edema. 4. Chronic findings are described above. Electronically Signed   By: Monte Fantasia M.D.   On: 02/15/2021 07:17   VAS [place] LOWER EXTREMITY VENOUS (DVT) (ONLY [facility] & [facility] 7a-7p)  Result Date: 02/15/2021  Lower Venous DVT  Study Patient Name:  [name]  Date of Exam:   02/15/2021 Medical Rec #: [id number]              Accession #:    [id number] Date of Birth: 1971-05-23              Patient Gender: M Patient Age:   36Y Exam Location:  [facility] Procedure:      VAS [place] LOWER EXTREMITY VENOUS (DVT) Referring Phys: [id number] [facility] PFEIFFER --------------------------------------------------------------------------------  Indications: Edema.  Risk Factors: Right lower extremity HD access. Limitations: Poor ultrasound/tissue interface, coughing, HD access and body habitus. Comparison Study: No prior study Performing Technologist: Maudry Mayhew MHA, RDMS, RVT, RDCS  Examination Guidelines: A complete evaluation includes B-mode imaging, spectral Doppler, color Doppler, and power Doppler as needed of all accessible portions of each vessel. Bilateral testing is considered an integral part of a complete examination. Limited examinations for reoccurring indications may be performed as noted. The reflux portion of the exam is performed with the patient in reverse Trendelenburg.  +---------+---------------+---------+-----------+----------+--------------+ RIGHT    CompressibilityPhasicitySpontaneityPropertiesThrombus Aging +---------+---------------+---------+-----------+----------+--------------+ CFV      Full                    Yes                                 +---------+---------------+---------+-----------+----------+--------------+ SFJ      Full                                                        +---------+---------------+---------+-----------+----------+--------------+ FV Prox  Full                                                        +---------+---------------+---------+-----------+----------+--------------+ FV Mid   Full                                                        +---------+---------------+---------+-----------+----------+--------------+  FV DistalFull                                                         +---------+---------------+---------+-----------+----------+--------------+ PFV      Full                                                        +---------+---------------+---------+-----------+----------+--------------+ POP      Full                    Yes                                 +---------+---------------+---------+-----------+----------+--------------+ PTV      Full                                                        +---------+---------------+---------+-----------+----------+--------------+ PERO     Full                                                        +---------+---------------+---------+-----------+----------+--------------+   Left Technical Findings: Not visualized segments include CFV.   Summary: RIGHT: - There is no evidence of deep vein thrombosis in the lower extremity.  - No cystic structure found in the popliteal fossa.   *See table(s) above for measurements and observations.    Preliminary     EKG: Independently reviewed.  NSR with rate 93; prolonged QTc 515; nonspecific ST changes with no evidence of acute ischemia   Labs on Admission: I have personally reviewed the available labs and imaging studies at the time of the admission.  Pertinent labs:   BUN 59/Creatinine 13.73/GFR 4 Anion gap 18 HS troponin 77, 80 Lactate 1.2, 1.5 WBC 8.3 Hgb 10.0 COVID/flu negative CRP 14.3   Assessment/Plan Principal Problem:   Sepsis due to pneumonia ([place]) Active Problems:   ESRD (end stage renal disease) on dialysis ([place])   Essential hypertension   SIRS due to PNA -SIRS criteria in this patient includes: Fever, tachycardia, tachypnea  -Patient has no current evidence of acute organ failure that is not easily explained by another condition. -While awaiting blood cultures, this appears to be a preseptic condition. -Sepsis protocol initiated -Worse outcomes are predicted from sepsis with 2 of the  following: RR > 22; AMS , GCS < 13; or SBP <100.  This patient meets 1 of these criteria. -Suspected source is PNA -Patient presenting with cough, fever, mildly decreased oxygen saturation, and patchy infiltrates on chest x-ray -This appears to be most likely community-acquired pneumonia.  -He was previously thought to have bronchitis and so this may be bacterial infection superimposed on initial viral infection. -Other etiologies include aspiration (if altered mental status was more severe than described) versus URI (most likely viral). -  Influenza negative. -COVID-19 negative. -Respiratory virus panel ordered to include pertussis -Will order lower respiratory tract procalcitonin level.   >0.5 indicates infection and >>0.5 indicates more serious disease.  As the procalcitonin level normalizes, it will be reasonable to consider de-escalation of antibiotic coverage.  The sensitivity of procalcitonin is variable and should not be used alone to guide treatment. -CURB-65 score is 2 - will admit to progressive for now. -Pneumonia Severity Index (PSI) is Class 4, 9% mortality. -Will start Azithromycin 500 mg IV daily and Rocephin due to no risk factors for MDR cause.   -Fever control -Repeat CBC in am -Consider albuterol PRN - currently on hold due to prolonged QTc -Will admit due to: hypoxemia; failure of outpatient treatment -Blood and urine cultures are pending  ESRD on HD -Patient on chronic MWF HD -Nephrology prn order set utilized -He does not appear to be volume overloaded or otherwise in need of acute HD -Nephrology has been notified that patient will need HD  HTN -Continue Norvasc, Hydralazine     Note: This patient has been tested and is negative for the novel coronavirus COVID-19. He has been fully vaccinated against COVID-19.    DVT prophylaxis:  Heparin Code Status:  Full - confirmed with patient Family Communication: None present Disposition Plan:  The patient is from:  home  Anticipated d/c is to: home without [facility] services  Anticipated d/c date will depend on clinical response to treatment, likely 2-3 days  Patient is currently: acutely ill Consults called: Nephrology  Admission status: Admit - It is my clinical opinion that admission to [place] is reasonable and necessary because of the expectation that this patient will require hospital care that crosses at least 2 midnights to treat this condition based on the medical complexity of the problems presented.  Given the aforementioned information, the predictability of an adverse outcome is felt to be significant.     Karmen Bongo MD [facility]   How to contact the [facility] Attending or Consulting provider [place] or covering provider during after hours [place], for this patient?  1. Check the care team in [facility] and look for a) attending/consulting [facility] provider listed and b) the [facility] team listed 2. Log into [email or web address] and use [place]'s universal password to access. If you do not have the password, please contact the hospital operator. 3. Locate the [facility] provider you are looking for under [facility] and page to a number that you can be directly reached. 4. If you still have difficulty reaching the provider, please page the [facility] (Director on Call) for the Hospitalists listed on amion for assistance.   02/15/2021, 12:29 PM

## 2021-02-15 NOTE — Progress Notes (Deleted)
[facility] Renal Consultation Note    Indication for Consultation:  Management of ESRD/hemodialysis; anemia, hypertension/volume and secondary hyperparathyroidism  [id number], No Pcp Per (Inactive)  HPI: [name] is a 50 y.o. male. ESRD on HD MWF at [facility].  Past medical history significant for HTN, Hep B, secondary hyperparathyroidism s/p parathyroidectomy in 2015, Hx recurrent bacteremia related to [facility] with prior septic emboli in 2011.  Of note patient is overall compliant with prescribed dialysis regimen.   Patient presents to the ED today due to fever, chills and cough x 2-3 weeks.  States he has 102F fever almost every afternoon for the last 2 weeks.  Denies sick contacts, CP, SOB, n/v/d, abdominal pain, weakness, dizziness and fatigue.  Admits to swelling in RLE, reports it is worsened following work.  States swelling in R leg usually occurs when hs has stenosis of thigh AVG and requires intervention.  Reports decreased appetite over the last few days but states he is hungry now.  Two ED visits prior to this due to fever, Dx with bronchitis and given steroids.    Pertinent findings on admission include tmax 102F, hypertension, tachypnea, CRP 14.3, sed rate 20, negative respiratory panel, neg HIV, CT chest/abd/pelvis with "mild patchy bilateral airspace disease suspicious for atypical pneumonia, trace pulmonary edema, Polycystic kidneys with notable indistinct left renal cyst/lesion which may be related to recent intracystic hemorrhage or infection in this setting."  Patient has been admitted for further evaluation and management.   Past Medical History:  Diagnosis Date  . Anemia   . Anxiety   . Cough    DRY    . Depression   . ESRD on hemodialysis ([place])    HD [place] MWF  . Headache(784.0)   . Hypertension   . Muscle spasms of neck    BACK, NECK  . Pneumonia 2015ish  . Thyroid disease    Past Surgical History:  Procedure Laterality Date  .  ARTERIOVENOUS GRAFT PLACEMENT Left    "forearm, it's not working; thigh"   . ARTERIOVENOUS GRAFT PLACEMENT Left 11/09/2015  . AV FISTULA PLACEMENT Right 01/10/2017   Procedure: INSERTION OF ARTERIOVENOUS Right thigh GORE-TEX GRAFT;  Surgeon: [name], MD;  Location: [place];  Service: Vascular;  Laterality: Right;  . FALSE ANEURYSM REPAIR Left 11/09/2015   Procedure: REPAIR OF LEFT FEMORAL PSEUDOANEURYSM; REVISION  OF LEFT THIGH ARTERIOVENOUS GRAFT USING 6MM X 10 CM GORETEX GRAFT ;  Surgeon: [name], MD;  Location: [facility] OR;  Service: Vascular;  Laterality: Left;  . IR AV DIALY SHUNT INTRO NEEDLE/INTRACATH INITIAL W/PTA/IMG RIGHT Right 02/06/2019  . IR AV DIALY SHUNT INTRO NEEDLE/INTRACATH INITIAL W/PTA/IMG RIGHT Right 05/14/2019  . IR AV DIALY SHUNT INTRO NEEDLE/INTRACATH INITIAL W/PTA/IMG RIGHT Right 08/18/2020  . IR DIALY SHUNT INTRO NEEDLE/INTRACATH INITIAL W/IMG RIGHT Right 09/17/2019  . IR DIALY SHUNT INTRO NEEDLE/INTRACATH INITIAL W/IMG RIGHT Right 12/10/2019  . IR GENERIC HISTORICAL  07/15/2016   IR US GUIDE VASC ACCESS LEFT 07/15/2016 [name], MD [facility]-INTER[facility]  . IR GENERIC HISTORICAL Left 07/15/2016   IR THROMBECTOMY AV FISTULA W/THROMBOLYSIS/PTA INC/SHUNT/IMG LEFT 07/15/2016 [name], MD [facility]  . IR GENERIC HISTORICAL  10/05/2016   IR US GUIDE VASC ACCESS LEFT 10/05/2016 [name], MD [facility]  . IR GENERIC HISTORICAL Left 10/05/2016   IR THROMBECTOMY AV FISTULA W/THROMBOLYSIS/PTA INC/SHUNT/IMG LEFT 10/05/2016 [name], MD [facility]  . IR GENERIC HISTORICAL  10/08/2016   IR FLUORO GUIDE CV LINE  RIGHT 10/08/2016 [name], MD [facility]  . IR GENERIC HISTORICAL  10/08/2016   IR US GUIDE VASC ACCESS RIGHT 10/08/2016 [name], MD [facility]  . IR PTA AND STENT ADDL CENTRAL DIALY SEG THRU DIALY CIRCUIT RIGHT Right 12/10/2019  . IR RADIOLOGIST EVAL & MGMT  11/26/2019  . IR REMOVAL TUN CV CATH W/O FL  03/16/2017  . IR THROMBECTOMY AV FISTULA  W/THROMBOLYSIS/PTA INC/SHUNT/IMG RIGHT Right 06/16/2018  . IR THROMBECTOMY AV FISTULA W/THROMBOLYSIS/PTA INC/SHUNT/IMG RIGHT Right 06/17/2018  . IR US GUIDE VASC ACCESS RIGHT  06/17/2018  . IR US GUIDE VASC ACCESS RIGHT  06/16/2018  . IR US GUIDE VASC ACCESS RIGHT  02/06/2019  . IR US GUIDE VASC ACCESS RIGHT  05/14/2019  . IR US GUIDE VASC ACCESS RIGHT  09/17/2019  . IR US GUIDE VASC ACCESS RIGHT  12/10/2019  . IR US GUIDE VASC ACCESS RIGHT  08/18/2020  . PARATHYROIDECTOMY N/A 04/24/2014   Procedure: TOTAL 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;  Surgeon: [name], MD;  Location: [place];  Service: General;  Laterality: N/A;  NECK AND LEFT FOREARM  . PERIPHERAL VASCULAR CATHETERIZATION N/A 05/05/2016   Procedure: A/V Shuntogram;  Surgeon: Conrad [place], MD;  Location: [place] CV LAB;  Service: Cardiovascular;  Laterality: N/A;  . PERIPHERAL VASCULAR CATHETERIZATION Left 05/05/2016   Procedure: Peripheral Vascular Balloon Angioplasty;  Surgeon: Conrad El[place], MD;  Location: [place] CV LAB;  Service: Cardiovascular;  Laterality: Left;  . PSEUDOANEURYSM REPAIR Left 11/09/2015  . THROMBECTOMY / ARTERIOVENOUS GRAFT REVISION Left 08/18/2015   thigh  . THROMBECTOMY AND REVISION OF ARTERIOVENTOUS (AV) GORETEX  GRAFT Left 08/23/2015   Procedure: THROMBECTOMY AND REVISION OF ARTERIOVENTOUS (AV) GORETEX  GRAFT LEFT THIGH ;  Surgeon: [name], MD;  Location: [place];  Service: Vascular;  Laterality: Left;  . THROMBECTOMY W/ EMBOLECTOMY Left 08/18/2015   Procedure: THROMBECTOMY  AND REVISION ARTERIOVENOUS GORE-TEX GRAFT/LEFT THIGH;  Surgeon: [name], MD;  Location: [place];  Service: Vascular;  Laterality: Left;  . THROMBECTOMY W/ EMBOLECTOMY Right 06/19/2018   Procedure: THROMBECTOMY AND REVISION OF RIGHT THIGH  ARTERIOVENOUS GORE-TEX GRAFT;  Surgeon: [name], MD;  Location: [place];  Service: Vascular;  Laterality: Right;  . UPPER EXTREMITY VENOGRAPHY Bilateral  12/27/2016   Procedure: Upper Extremity Venography;  Surgeon: [name], MD;  Location: [place] CV LAB;  Service: Cardiovascular;  Laterality: Bilateral;  . VENOGRAM Left 05/05/2016   Procedure: Venogram;  Surgeon: Conrad , MD;  Location: [place] [facility];  Service: Cardiovascular;  Laterality: Left;  lower extremity   Family History  Problem Relation Age of Onset  . Renal Disease Neg Hx    Social History:  reports that he quit smoking about 34 years ago. He has never used smokeless tobacco. He reports that he does not drink alcohol and does not use drugs. Allergies  Allergen Reactions  . Ivp Dye [Iodinated Diagnostic Agents] Swelling    SWELLING REACTION UNSPECIFIED   . Lisinopril Cough  . Solu-Medrol [Methylprednisolone] Nausea And Vomiting   Prior to Admission medications   Medication Sig Start Date End Date Taking? Authorizing Provider  acetaminophen (TYLENOL) 500 MG tablet Take 1,000 mg by mouth daily as needed (for pain or headaches).    Yes [provider]  albuterol (VENTOLIN HFA) 108 (90 Base) MCG/ACT inhaler Inhale 1-2 puffs into the lungs every 6 (six) hours as needed for wheezing. 02/09/21  Yes Larene Pickett, PA-C  amLODipine (NORVASC) 10 MG tablet Take 10 mg by mouth daily.   Yes [provider]  azithromycin (ZITHROMAX) 250 MG tablet Take 1 tablet (250 mg total) by mouth daily. Take first 2 tablets together, then 1 every day until finished. 02/15/21  Yes Rancour, Annie Main, MD  B Complex-C-Folic Acid (DIALYVITE TABLET) TABS Take 1 tablet by mouth daily. 06/29/20  Yes [provider]  benzonatate (TESSALON) 100 MG capsule Take 1 capsule (100 mg total) by mouth every 8 (eight) hours. 02/09/21  Yes Larene Pickett, PA-C  hydrALAZINE (APRESOLINE) 50 MG tablet Take 50 mg by mouth 3 (three) times daily. 11/04/20  Yes [provider]  HYDROcodone-acetaminophen (NORCO) 5-325 MG tablet Take 1 tablet by mouth every 4 (four) hours as needed  (cough). Patient taking differently: Take 1 tablet by mouth every 4 (four) hours as needed for moderate pain (cough). [id number]  Yes Delora Fuel, MD  lidocaine-prilocaine (EMLA) cream Apply 1 application topically daily as needed (port access).  02/03/20  Yes [provider]  TUMS ULTRA 1000 1000 MG chewable tablet Chew 4,000 mg by mouth 3 (three) times daily with meals. 03/14/20  Yes [provider]  fluticasone (FLONASE) 50 MCG/ACT nasal spray Place 2 sprays into both nostrils daily. Patient not taking: Reported on 06/15/2018 07/25/17 02/20/20  Larene Pickett, PA-C   Current Facility-Administered Medications  Medication Dose Route Frequency Provider Last Rate Last Admin  . acetaminophen (TYLENOL) tablet 650 mg  650 mg Oral Q6H PRN Karmen Bongo, MD       Or  . acetaminophen (TYLENOL) suppository 650 mg  650 mg Rectal Q6H PRN Karmen Bongo, MD      . amLODipine (NORVASC) tablet 10 mg  10 mg Oral Daily Karmen Bongo, MD   10 mg at 02/15/21 1215  . [START ON 02/16/2021] azithromycin (ZITHROMAX) 500 mg in sodium chloride 0.9 % 250 mL IVPB  500 mg Intravenous Q24H Karmen Bongo, MD      . benzonatate (TESSALON) capsule 100 mg  100 mg Oral Lynne Logan, MD   100 mg at 02/15/21 1450  . calcium carbonate (dosed in mg elemental calcium) suspension 500 mg of elemental calcium  500 mg of elemental calcium Oral Q6H PRN Karmen Bongo, MD      . camphor-menthol [name]) lotion 1 application  1 application Topical [id number] PRN Karmen Bongo, MD       And  . hydrOXYzine (ATARAX/VISTARIL) tablet 25 mg  25 mg Oral Q8H PRN Karmen Bongo, MD      . cefTRIAXone (ROCEPHIN) 2 g in sodium chloride 0.9 % 100 mL IVPB  2 g Intravenous Q24H Karmen Bongo, MD   Stopped at 02/15/21 1114  . docusate sodium (ENEMEEZ) enema 283 mg  1 enema Rectal PRN Karmen Bongo, MD      . feeding supplement (NEPRO CARB STEADY) liquid 237 mL  237 mL Oral TID PRN Karmen Bongo, MD      . heparin injection  5,000 Units  5,000 Units Subcutaneous Lynne Logan, MD      . hydrALAZINE (APRESOLINE) tablet 50 mg  50 mg Oral TID Karmen Bongo, MD   50 mg at 02/15/21 1215  . HYDROcodone-acetaminophen (NORCO/VICODIN) 5-325 MG per tablet 1 tablet  1 tablet Oral Q4H PRN Karmen Bongo, MD      . multivitamin (RENA-VIT) tablet 1 tablet  1 tablet Oral QHS Karmen Bongo, MD      . sodium chloride flush (NS) 0.9 % injection 3 mL  3 mL Intravenous Lillia Mountain, MD   3 mL at 02/15/21 1322  . sorbitol 70 % solution 30 mL  30 mL Oral PRN Karmen Bongo, MD      . zolpidem Lorrin Mais) tablet 5 mg  5 mg Oral QHS PRN Karmen Bongo, MD       Current Outpatient Medications  Medication Sig Dispense Refill  . acetaminophen (TYLENOL) 500 MG tablet Take 1,000 mg by mouth daily as needed (for pain or headaches).     Marland Kitchen albuterol (VENTOLIN HFA) 108 (90 Base) MCG/ACT inhaler Inhale 1-2 puffs into the lungs every 6 (six) hours as needed for wheezing. 1 each 0  . amLODipine (NORVASC) 10 MG tablet Take 10 mg by mouth daily.    Marland Kitchen azithromycin (ZITHROMAX) 250 MG tablet Take 1 tablet (250 mg total) by mouth daily. Take first 2 tablets together, then 1 every day until finished. 6 tablet 0  . B Complex-C-Folic Acid (DIALYVITE TABLET) TABS Take 1 tablet by mouth daily.    . benzonatate (TESSALON) 100 MG capsule Take 1 capsule (100 mg total) by mouth every 8 (eight) hours. 21 capsule 0  . hydrALAZINE (APRESOLINE) 50 MG tablet Take 50 mg by mouth 3 (three) times daily.    Marland Kitchen HYDROcodone-acetaminophen (NORCO) 5-325 MG tablet Take 1 tablet by mouth every 4 (four) hours as needed (cough). (Patient taking differently: Take 1 tablet by mouth every 4 (four) hours as needed for moderate pain (cough).) 6 tablet 0  . lidocaine-prilocaine (EMLA) cream Apply 1 application topically daily as needed (port access).     . TUMS ULTRA 1000 1000 MG chewable tablet Chew 4,000 mg by mouth 3 (three) times daily with meals.      Facility-Administered Medications Ordered in Other Encounters  Medication Dose Route Frequency Provider Last Rate Last Admin  . 0.9 %  sodium chloride infusion   Intravenous Continuous Monia Sabal, PA-C       Labs: Basic Metabolic Panel: Recent Labs  Lab 02/14/21 2022  NA 138  K 4.9  CL 95*  CO2 25  GLUCOSE 84  BUN 59*  CREATININE 13.73*  CALCIUM 7.3*   Liver Function Tests: Recent Labs  Lab 02/14/21 2022  AST 15  ALT 16  ALKPHOS 94  BILITOT 1.4*  PROT 6.6  ALBUMIN 3.0*   CBC: Recent Labs  Lab 02/14/21 2022  WBC 8.3  NEUTROABS 7.0  HGB 10.0*  HCT 31.7*  MCV 94.3  PLT 265   Studies/Results: CT ABDOMEN PELVIS WO CONTRAST  Result Date: 02/15/2021 CLINICAL DATA:  Fever of unknown origin EXAM: CT CHEST, ABDOMEN AND PELVIS WITHOUT CONTRAST TECHNIQUE: Multidetector CT imaging of the chest, abdomen and pelvis was performed following the standard protocol without IV contrast. COMPARISON:  12/13/2016 abdominal CT FINDINGS: CT CHEST FINDINGS Cardiovascular: Normal heart size. Border. Calcified fibrin sheath at the left brachiocephalic and right brachiocephalic veins, extending into the SVC. These vessels appear small caliber in places. Atheromatous calcification. The ascending aorta is 4 cm in diameter. Mediastinum/Nodes: No adenopathy or mass.  Parathyroidectomy changes Lungs/Pleura: Mild patchy subpleural opacity at the bases. These are small and more flat appearing than would be expected for infarct. Mild fissure and interlobular septal thickening at the bases. Musculoskeletal: No acute finding. CT ABDOMEN PELVIS FINDINGS Hepatobiliary: No focal liver abnormality.No evidence of biliary obstruction or stone. Pancreas: Unremarkable. Spleen: Unremarkable. Adrenals/Urinary Tract: Negative adrenals. Polycystic kidneys with atrophic intervening parenchyma. Multiple cysts with mural calcifications. There also a few high-density proteinaceous appearing lesion/cysts. There  is a  notable low-density lesion in the left lower kidney measuring 3.7 cm with hazy or margins and internal isodensity. No hydronephrosis. Negative collapsed bladder. Stomach/Bowel:  No obstruction. No visible bowel inflammation Vascular/Lymphatic: Diffuse arterial calcification. Dialysis access at the left thigh with a chronic collection that has decreased in size and increase in peripheral densities since 2018, deforming the adjacent emanating grafts. Grafts have been added to the right thigh since prior. Right iliac vein stenting. No mass or adenopathy. Reproductive:No pathologic findings. Other: No ascites or pneumoperitoneum. Musculoskeletal: Renal osteodystrophy.  No acute finding IMPRESSION: 1. Mild patchy bilateral airspace disease suspicious for atypical pneumonia. 2. Polycystic kidneys. Notable indistinct left renal cyst/lesion which may be related to recent intracystic hemorrhage or infection in this setting. 3. Cardiomegaly and trace pulmonary edema. 4. Chronic findings are described above. Electronically Signed   By: Monte Fantasia M.D.   On: 02/15/2021 07:17   DG Chest 2 View  Result Date: 02/14/2021 CLINICAL DATA:  Shortness of breath EXAM: CHEST - 2 VIEW COMPARISON:  Feb 09, 2021 FINDINGS: Mild cardiac enlargement, similar prior. No focal consolidation. No pleural effusion. No pneumothorax. Surgical clips overlie the neck. The visualized skeletal structures are unremarkable. IMPRESSION: No acute cardiopulmonary disease. Electronically Signed   By: Dahlia Bailiff MD   On: 02/14/2021 22:28   CT Chest Wo Contrast  Result Date: 02/15/2021 CLINICAL DATA:  Fever of unknown origin EXAM: CT CHEST, ABDOMEN AND PELVIS WITHOUT CONTRAST TECHNIQUE: Multidetector CT imaging of the chest, abdomen and pelvis was performed following the standard protocol without IV contrast. COMPARISON:  12/13/2016 abdominal CT FINDINGS: CT CHEST FINDINGS Cardiovascular: Normal heart size. Border. Calcified fibrin sheath at the  left brachiocephalic and right brachiocephalic veins, extending into the SVC. These vessels appear small caliber in places. Atheromatous calcification. The ascending aorta is 4 cm in diameter. Mediastinum/Nodes: No adenopathy or mass.  Parathyroidectomy changes Lungs/Pleura: Mild patchy subpleural opacity at the bases. These are small and more flat appearing than would be expected for infarct. Mild fissure and interlobular septal thickening at the bases. Musculoskeletal: No acute finding. CT ABDOMEN PELVIS FINDINGS Hepatobiliary: No focal liver abnormality.No evidence of biliary obstruction or stone. Pancreas: Unremarkable. Spleen: Unremarkable. Adrenals/Urinary Tract: Negative adrenals. Polycystic kidneys with atrophic intervening parenchyma. Multiple cysts with mural calcifications. There also a few high-density proteinaceous appearing lesion/cysts. There is a notable low-density lesion in the left lower kidney measuring 3.7 cm with hazy or margins and internal isodensity. No hydronephrosis. Negative collapsed bladder. Stomach/Bowel:  No obstruction. No visible bowel inflammation Vascular/Lymphatic: Diffuse arterial calcification. Dialysis access at the left thigh with a chronic collection that has decreased in size and increase in peripheral densities since 2018, deforming the adjacent emanating grafts. Grafts have been added to the right thigh since prior. Right iliac vein stenting. No mass or adenopathy. Reproductive:No pathologic findings. Other: No ascites or pneumoperitoneum. Musculoskeletal: Renal osteodystrophy.  No acute finding IMPRESSION: 1. Mild patchy bilateral airspace disease suspicious for atypical pneumonia. 2. Polycystic kidneys. Notable indistinct left renal cyst/lesion which may be related to recent intracystic hemorrhage or infection in this setting. 3. Cardiomegaly and trace pulmonary edema. 4. Chronic findings are described above. Electronically Signed   By: Monte Fantasia M.D.   On:  02/15/2021 07:17   VAS [place] LOWER EXTREMITY VENOUS (DVT) (ONLY [facility] & [facility] 7a-7p)  Result Date: 02/15/2021  Lower Venous DVT Study Patient Name:  [name]  Date of Exam:   02/15/2021 Medical Rec #: [id number]  Accession #:    [id number] Date of Birth: 13-Mar-1971              Patient Gender: M Patient Age:   89Y Exam Location:  [facility] Procedure:      VAS [place] LOWER EXTREMITY VENOUS (DVT) Referring Phys: [id number] [facility] PFEIFFER --------------------------------------------------------------------------------  Indications: Edema.  Risk Factors: Right lower extremity HD access. Limitations: Poor ultrasound/tissue interface, coughing, HD access and body habitus. Comparison Study: No prior study Performing Technologist: Maudry Mayhew MHA, RDMS, RVT, RDCS  Examination Guidelines: A complete evaluation includes B-mode imaging, spectral Doppler, color Doppler, and power Doppler as needed of all accessible portions of each vessel. Bilateral testing is considered an integral part of a complete examination. Limited examinations for reoccurring indications may be performed as noted. The reflux portion of the exam is performed with the patient in reverse Trendelenburg.  +---------+---------------+---------+-----------+----------+--------------+ RIGHT    CompressibilityPhasicitySpontaneityPropertiesThrombus Aging +---------+---------------+---------+-----------+----------+--------------+ CFV      Full                    Yes                                 +---------+---------------+---------+-----------+----------+--------------+ SFJ      Full                                                        +---------+---------------+---------+-----------+----------+--------------+ FV Prox  Full                                                        +---------+---------------+---------+-----------+----------+--------------+ FV Mid   Full                                                         +---------+---------------+---------+-----------+----------+--------------+ FV DistalFull                                                        +---------+---------------+---------+-----------+----------+--------------+ PFV      Full                                                        +---------+---------------+---------+-----------+----------+--------------+ POP      Full                    Yes                                 +---------+---------------+---------+-----------+----------+--------------+ PTV      Full                                                        +---------+---------------+---------+-----------+----------+--------------+  PERO     Full                                                        +---------+---------------+---------+-----------+----------+--------------+   Left Technical Findings: Not visualized segments include CFV.   Summary: RIGHT: - There is no evidence of deep vein thrombosis in the lower extremity.  - No cystic structure found in the popliteal fossa.   *See table(s) above for measurements and observations.    Preliminary     ROS: All others negative except those listed in HPI.  Physical Exam: Vitals:   02/15/21 1530 02/15/21 1545 02/15/21 1600 02/15/21 1604  BP: 118/78 127/76 126/70   Pulse: 96 90 95   Resp:    18  Temp:    98.8 F (37.1 C)  TempSrc:    Oral  SpO2: 98% 99% 98%   Weight:      Height:         General: WDWN male in NAD Head: NCAT sclera not icteric MMM Neck: Supple. No lymphadenopathy Lungs: CTA bilaterally. No wheeze, rales or rhonchi. Breathing is unlabored on 2L via O2. Heart: RRR. No murmur, rubs or gallops.  Abdomen: soft, nontender, +BS, no guarding, no rebound tenderness Lower extremities:1+ edema on R, trace edema on left Neuro: AAOx3. Moves all extremities spontaneously. Psych:  Responds to questions appropriately with a normal affect. Dialysis Access:  R thigh AVG  +b/t  Dialysis Orders:  MWF - [facility]  3.75hrs, BFR 450, DFR 500,  EDW 73kg, 2K/ 2.5Ca P4  Access: R thigh AVG  Heparin 1500 unit bolus qHD Mircera 75 mcg q2wks - last 140mg on 02/03/21 Venofer '50mg'$  qHD Calcitriol 0.253m PO qHD  Assessment/Plan: 1. PNA - CXR with opacities likely atypical PNA.  Resp panel negative. Started on ABX. BC pending. Per primary. 2.  ESRD -  On HD MWF.  No acute indications for emergent/urgent dialysis today.  Due to high patient census, will plan for HD tomorrow and resume regular schedule on Wednesday. 3.  Hypertension/volume  - Blood pressure elevated.  Continue home meds.  CT with trace pulmonary edema, plan for UF 2L as tolerated.  Possible weight loss with recent decreased appetite, close to dry per weights.  Get standing weights to better assess.  4.  Anemia of CKD - Hgb 10.0 ESA due Wednesday.  5.  Secondary Hyperparathyroidism -  S/p parathyroidectomy. CCa close to goal.  Will check phos, last low as OP.  Binders reduced to 2 TUMS AC TID last week.   6.  Nutrition - Renal diet w/fluid restrictions.  7. Hep B 8.    Polycystic kidneys/indistinct left renal cyst/lesion - ?recent intracystic hemorrhage vs infection.  On ABX.  No abdominal symptoms.   LiJen MowPA-C [facility] 02/15/2021, 4:07 PM

## 2021-02-15 NOTE — Progress Notes (Signed)
Right lower extremity venous duplex completed. Refer to "CV Proc" under chart review to view preliminary results.  02/15/2021 9:34 AM Kelby Aline., MHA, RVT, RDCS, RDMS

## 2021-02-15 NOTE — Progress Notes (Deleted)
Lives alone; NOK: Martie Round, [id number]  He reports that he has had fever, chills, cough for 2 weeks.  This is his third ER visit - first time, bronchitis and gave steroids but no antibiotics.  The second time he wasn't given anything other than cough medicine.  Fever up to 102.  Cough is productive of white sputum.  No sick contacts.  No GI symptoms.  No rash.  Similar issue several years ago, treated with antibiotics.  No cane  Complete, no booster

## 2021-02-15 NOTE — ED Notes (Addendum)
PT refusing to ambulate, said to give him a few minutes as he doesn't feel well.  Put PT back on Room Air to see how he tolerates it while in bed.

## 2021-02-15 NOTE — ED Notes (Signed)
Pt returned from CT °

## 2021-02-15 NOTE — ED Notes (Signed)
Pt transported to CT ?

## 2021-02-15 NOTE — ED Notes (Addendum)
PT dropped to 88% on room air while laying in bed. Put back on O2 [place] and jumped back up to 97%

## 2021-02-15 NOTE — Discharge Instructions (Addendum)
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  You will be called if your blood cultures grow something and you need to return to the ED. Return to the ED with difficulty breathing, chest pain, any other concerns  Bacteremia, Adult Bacteremia is the presence of bacteria in the blood. When bacteria enter the bloodstream, they can cause a life-threatening reaction called sepsis. Sepsis is a medical emergency. What are the causes? This condition is caused by bacteria that get into the blood. Bacteria can enter the blood from an infection, including:  A skin infection or injury, such as a burn or a cut.  A lung infection (pneumonia).  An infection in the stomach or intestines.  An infection in the bladder or urinary system (urinary tract infection).  A bacterial infection in another part of the body that spreads to the blood. Bacteria can also enter the blood during a dental or medical procedure, from bleeding gums, or through use of an unclean needle. What increases the risk? This condition is more likely to develop in children, older adults, and people who have:  A long-term (chronic) disease or condition like diabetes or chronic kidney failure.  An artificial joint or heart valve, or heart valve disease.  A tube inserted to treat a medical condition, such as a urinary catheter or IV.  A weak disease-fighting system (immune system).  Injected illegal drugs.  Been hospitalized for more than 10 days in a row. What are the signs or symptoms? Symptoms of this condition include:  Fever and chills.  Fast heartbeat and shortness of breath.  Dizziness, weakness, and low blood pressure.  Confusion or anxiety.  Pain in the abdomen, nausea, vomiting, and diarrhea. Bacteremia that has spread to other parts of the body may cause symptoms in those areas. In some cases, there are no symptoms. How is this diagnosed? This condition may be diagnosed with a physical exam and tests, such  as:  Blood tests to check for bacteria (cultures) or other signs of infection.  Tests of any tubes that you have had inserted. These tests check for a source of infection.  Urine tests to check for bacteria in the urine.  Imaging tests, such as an X-ray, a CT scan, an MRI, or a heart ultrasound. These check for a source of infection in other parts of your body, such as your lungs, heart valves, or joints.   How is this treated? This condition is usually treated in the hospital. If you are treated at home, you may need to return to the hospital for medicines, blood tests, and evaluation. Treatment may include:  Antibiotic medicines. These may be given by mouth or directly into your blood through an IV. You may need antibiotics for several weeks. At first, you may be given an antibiotic to kill most types of blood bacteria. If tests show that a certain kind of bacteria is causing the problem, you may be given a different antibiotic.  IV fluids.  Removing any catheter or device that could be a source of infection.  Blood pressure and breathing support, if needed.  Surgery to control the source or the spread of infection, such as surgery to remove an implanted device, abscess, or infected tissue. Follow these instructions at home: Medicines  Take over-the-counter and prescription medicines only as told by your health care provider.  If you were prescribed an antibiotic medicine, take it as told by your health care provider. Do not stop taking the antibiotic even if you start to feel  better. General instructions  Rest as needed. Ask your health care provider when you may return to normal activities.  Drink enough fluid to keep your urine pale yellow.  Do not use any products that contain nicotine or tobacco, such as cigarettes, e-cigarettes, and chewing tobacco. If you need help quitting, ask your health care provider.  Keep all follow-up visits as told by your health care provider.  This is important.   How is this prevented?  Wash your hands regularly with soap and water. If soap and water are not available, use hand sanitizer.  You should wash your hands: ? After using the toilet or changing a diaper. ? Before preparing, cooking, serving, or eating food. ? While caring for a sick person or while visiting someone in a hospital. ? Before and after changing bandages (dressings) over wounds.  Clean any scrapes or cuts with soap and water and cover them with a clean bandage.  Get vaccinations as recommended by your health care provider.  Practice good oral hygiene. Brush your teeth two times a day, and floss regularly.  Take good care of your skin. This includes bathing and moisturizing on a regular basis.   Contact a health care provider if:  Your symptoms get worse, and medicines do not help.  You have severe pain. Get help right away if you have:  Pain.  A fever or chills.  Trouble breathing.  A fast heart rate.  Skin that is blotchy, pale, or clammy.  Confusion.  Weakness.  Lack of energy or unusual sleepiness.  New symptoms that develop after treatment has started. These symptoms may represent a serious problem that is an emergency. Do not wait to see if the symptoms will go away. Get medical help right away. Call your local emergency services (911 in the [place] Do not drive yourself to the hospital. Summary  Bacteremia is the presence of bacteria in the blood. When bacteria enter the bloodstream, they can cause a life-threatening reaction called sepsis.  Bacteremia is usually treated with antibiotic medicines in the hospital.  If you were prescribed an antibiotic medicine, take it as told by your health care provider. Do not stop taking the antibiotic even if you start to feel better.  Get help right away if you have any new symptoms that develop after treatment has started. This information is not intended to replace advice given to you by  your health care provider. Make sure you discuss any questions you have with your health care provider. Document Revised: 02/08/2019 Document Reviewed: 02/08/2019 Elsevier Patient Education  [place].   Fever, Adult     A fever is an increase in the body's temperature. It is usually defined as a temperature of 100.35F (38C) or higher. Brief mild or moderate fevers generally have no long-term effects, and they often do not need treatment. Moderate or high fevers may make you feel uncomfortable and can sometimes be a sign of a serious illness or disease. The sweating that may occur with repeated or prolonged fever may also cause a loss of fluid in the body (dehydration). Fever is confirmed by taking a temperature with a thermometer. A measured temperature can vary with:  Age.  Time of day.  Where in the body you take the temperature. Readings may vary if you place the thermometer: ? In the mouth (oral). ? In the rectum (rectal). ? In the ear (tympanic). ? Under the arm (axillary). ? On the forehead (temporal). Follow these instructions at home:  Medicines  Take over-the counter and prescription medicines only as told by your health care provider. Follow the dosing instructions carefully.  If you were prescribed an antibiotic medicine, take it as told by your health care provider. Do not stop taking the antibiotic even if you start to feel better. General instructions  Watch your condition for any changes. Let your health care provider know about them.  Rest as needed.  Drink enough fluid to keep your urine pale yellow. This helps to prevent dehydration.  Sponge yourself or bathe with room-temperature water to help reduce your body temperature as needed. Do not use ice water.  Do not use too many blankets or wear clothes that are too heavy.  If your fever may be caused by an infection that spreads from person to person (is contagious), such as a cold or the flu, you  should stay home from work and public gatherings for at least 24 hours after your fever is gone. Your fever should be gone without the need to use medicines. Contact a health care provider if:  You vomit.  You cannot eat or drink without vomiting.  You have diarrhea.  You have pain when you urinate.  Your symptoms do not improve with treatment.  You develop new symptoms.  You develop excessive weakness. Get help right away if:  You have shortness of breath or have trouble breathing.  You are dizzy or you faint.  You are disoriented or confused.  You develop signs of dehydration, such as: ? Dark urine, very little urine, or no urine. ? Cracked lips. ? Dry mouth. ? Sunken eyes. ? Sleepiness. ? Weakness.  You develop severe pain in your abdomen.  You have persistent vomiting or diarrhea.  You develop a skin rash.  Your symptoms suddenly get worse. Summary  A fever is an increase in the body's temperature. It is usually defined as a temperature of 100.61F (38C) or higher. Moderate or high fevers can sometimes be a sign of a serious illness or disease. The sweating that may occur with repeated or prolonged fever may also cause dehydration.  Pay attention to any changes in your symptoms and contact your health care provider if your symptoms do not improve with treatment.  Take over-the counter and prescription medicines only as told by your health care provider. Follow the dosing instructions carefully.  If your fever is from an infection that may be contagious, such as cold or flu, you should stay home from work and public gatherings for at least 24 hours after your fever is gone. Your fever should be gone without the need to use medicines.  Get help right away if you develop signs of dehydration, such as dark urine, cracked lips, dry mouth, sunken eyes, sleepiness, or weakness. This information is not intended to replace advice given to you by your health care provider.  Make sure you discuss any questions you have with your health care provider. Document Revised: 03/05/2018 Document Reviewed: 03/05/2018 Elsevier Patient Education  2021 [place].   Sepsis, Self Care, Adult Sepsis is a serious illness that may require intensive care in the hospital. The following information explains what you need to know in order to manage your condition after you are discharged from the hospital. What are the risks? After being treated for sepsis and discharged from the hospital, you may be at a higher risk for certain problems. These problems may be physical or mental. Physical problems:  Weakness and tiredness.  Shortness of breath.  Pain in many areas of the body.  Difficulty walking.  Dry, itchy skin.  Lack of appetite. This may lead to weight loss.  Organ failure. Mental problems:  Difficulty sleeping.  Depression.  Confusion.  Anxiety and worry caused by having gone through a bad experience (post-traumatic stress disorder,PTSD).  Low self-esteem. Follow these instructions at home: Medicines  Take over-the-counter and prescription medicines only as told by your health care provider.  If you were prescribed an antibiotic, antiviral, or antifungal medicine, take it as told by your health care provider. Do not stop taking the medicine even if you start to feel better. Eating and drinking  Eat a healthy diet that includes plenty of vegetables, fruits, whole grains, low-fat dairy products, and lean protein. Ask your health care provider if you should avoid certain foods.  Drink enough fluid to keep your urine pale yellow.   Alcohol use  Do not drink alcohol if: ? Your health care provider tells you not to drink. ? You are pregnant, may be pregnant, or are planning to become pregnant.  If you drink alcohol, limit how much you use to: ? 0-1 drink a day for women. ? 0-2 drinks a day for men.  Be aware of how much alcohol is in your drink. In  the [place] one drink equals one 12 oz bottle of beer (355 mL), one 5 oz glass of wine (148 mL), or one 1 oz glass of hard liquor (44 mL). Activity  Rest and gradually return to your normal activities. Ask your health care provider what activities are safe for you.  Avoid sitting for a long time without moving. Get up to take short walks every 1-2 hours. This is important to improve blood flow and breathing. Ask for help if you feel weak or unsteady.  Try to set small, achievable goals each week, such as dressing yourself, bathing, or walking up the stairs. It may take a while to rebuild your strength.  Try to exercise regularly if you feel healthy enough to do so. Ask your health care provider what exercises are safe for you. Preventing infection  Keep your vaccinations up to date. Get the flu shot every year.  Wash your hands often using soap and water. Use hand sanitizer if soap and water are not available.  Practice good hygiene. Keep cuts clean and covered until healed.   Managing stress Talk with your health care provider or counselor about ways to reduce stress. He or she may suggest:  Meditation, muscle relaxation, and breathing exercises.  Talk therapy.  Spending time on hobbies and activities that you enjoy. General instructions  Get the right amount and quality of sleep. Most adults need 7-9 hours of sleep each night. To help with sleep: ? Keep your bedroom cool and dark. ? Do not eat a heavy meal within one hour of bedtime. ? Do not drink alcohol or caffeinated drinks before bed. ? Avoid screen time, such as television, computers, tablets, or cell phones before bed.  Do not use any products that contain nicotine or tobacco, such as cigarettes, e-cigarettes, and chewing tobacco. If you need help quitting, ask your health care provider.  Talk to trusted family members and friends about your condition. Explain your symptoms to them, and let them know that you are working  with a health care provider to treat your condition. This can provide you with one way to get support and guidance.  Keep all follow-up visits as told by your health care  provider. This is important. Questions to ask your health care provider:  What physical and emotional changes do I need to report?  Do I need to have someone with me all the time?  Is it safe for me to drive? Contact a health care provider if you:  Do not feel like you are getting better or regaining strength.  Have muscle or joint pain.  Frequently feel tired.  Are having trouble coping with your recovery.  Have nightmares, or trouble falling asleep or staying asleep.  Feel sad, down, or depressed more often than not, every day for more than 2 weeks.  Have difficulty concentrating.  Feel irritable or you cry for no reason. Get help right away if you:  Have difficulty breathing.  Have a rapid or skipping heartbeat.  Become confused or disoriented.  See, hear, or feel things that do not exist (hallucinations).  Have a high fever.  Have an infection that is getting worse or not getting better.  You have thoughts of hurting yourself or others. If you ever feel like you may hurt yourself or others, or have thoughts about taking your own life, get help right away. You can go to your nearest emergency department or call:  Your local emergency services (911 in the [place]  A suicide crisis helpline, such as the [place] at 512-657-5627. This is open 24 hours a day. Summary  Sepsis is a serious illness that may require intensive care in a hospital. You may experience long-term health effects after you are discharged from the hospital.  Try to set small, achievable goals each week, such as dressing yourself, bathing, or walking up the stairs. It may take a while to rebuild your strength.  Keep all follow-up visits as told by your health care provider. This is  important.  Know what symptoms you should get help right away for. This information is not intended to replace advice given to you by your health care provider. Make sure you discuss any questions you have with your health care provider. Document Revised: 04/27/2018 Document Reviewed: 04/27/2018 Elsevier Patient Education  [place].   Fever, Adult     A fever is an increase in your body's temperature. It often means a temperature of 100.50F (38C) or higher. Brief mild or moderate fevers often have no long-term effects. They often do not need treatment. Moderate or high fevers may make you feel uncomfortable. Sometimes, they can be a sign of a serious illness or disease. A fever that keeps coming back or that lasts a long time may cause you to lose water in your body (get dehydrated). You can take your temperature with a thermometer to see if you have a fever. Temperature can change with:  Age.  Time of day.  Where the thermometer is put in the body. Readings may vary when the thermometer is put: ? In the mouth (oral). ? In the butt (rectal). ? In the ear (tympanic). ? Under the arm (axillary). ? On the forehead (temporal). Follow these instructions at home: Medicines  Take over-the-counter and prescription medicines only as told by your doctor. Follow the dosing instructions carefully.  If you were prescribed an antibiotic medicine, take it as told by your doctor. Do not stop taking it even if you start to feel better. General instructions  Watch for any changes in your symptoms. Tell your doctor about them.  Rest as needed.  Drink enough fluid to keep your pee (urine) pale yellow.  Sponge yourself or bathe with room-temperature water as needed. This helps to lower your body temperature. Do not use ice water.  Do not use too many blankets or wear clothes that are too heavy.  If your fever was caused by an infection that spreads from person to person (is  contagious), such as a cold or the flu: ? You should stay home from work and public places for at least 24 hours after your fever is gone. ? Your fever should be gone for at least 24 hours without the need to use medicines. Contact a doctor if:  You throw up (vomit).  You cannot eat or drink without throwing up.  You have watery poop (diarrhea).  It hurts when you pee.  Your symptoms do not get better with treatment.  You have new symptoms.  You feel very weak. Get help right away if:  You are short of breath or have trouble breathing.  You are dizzy or you pass out (faint).  You feel mixed up (confused).  You have signs of not having enough water in your body, such as: ? Dark pee, very little pee, or no pee. ? Cracked lips. ? Dry mouth. ? Sunken eyes. ? Sleepiness. ? Weakness.  You have very bad pain in your belly (abdomen).  You keep throwing up or having watery poop.  You have a rash on your skin.  Your symptoms get worse all of a sudden. Summary  A fever is an increase in your body's temperature. It often means a temperature of 100.79F (38C) or higher.  Watch for any changes in your symptoms. Tell your doctor about them.  Take all medicines only as told by your doctor.  Do not go to work or other public places if your fever was caused by an illness that can spread to other people.  Get help right away if you have signs that you do not have enough water in your body. This information is not intended to replace advice given to you by your health care provider. Make sure you discuss any questions you have with your health care provider. Document Revised: 03/05/2018 Document Reviewed: 03/05/2018 Elsevier Patient Education  2021 [place], Adult  Urosepsis is a type of sepsis. Sepsis is a severe bodily reaction to an infection. Urosepsis is caused by a bacterial infection that starts in the urinary tract and spreads to the blood. The urinary  tract is the system where urine is made, stored, and passed out of the body and includes the kidneys, ureters, bladder, and urethra. This may also be called the urinary system. In severe cases, sepsis can lead to septic shock. Septic shock can weaken your heart and cause your blood pressure to drop. This can make the body's central nervous system and other vital organs stop working. Urosepsis is a medical emergency that requires immediate treatment in a hospital. What are the causes? Common causes of this condition include:  A urinary tract infection (UTI) that spreads to your blood.  A urinary tract blockage due to kidney stones.  Swelling and inflammation of the prostate (prostatitis) or prostate infection, in males. What increases the risk? You are more likely to develop this condition if you:  Are male, especially if you are sexually active.  Are age 53 or older.  Have a long-term disease, such as kidney disease or diabetes.  Have a weak disease-fighting system (immune system).  Have a condition that lessens or changes urine flow, such as  a kidney or bladder stone, prostate disease, or a tumor in the urinary tract.  Have had surgery in an area of the urinary tract.  Have a small, thin tube in your urethra that drains urine from your bladder for a period of time (indwelling urinary catheter).  Have lost feeling below the waist or are in a wheelchair. What are the signs or symptoms? Early symptoms of this condition are similar to symptoms of a severe UTI. Common symptoms of this condition include:  Pain in your side, back, or lower abdomen.  Fever and chills.  Nausea and vomiting.  Frequent need to pass urine.  Burning pain when passing urine.  Bloody or cloudy urine.  Bad-smelling urine.  Fatigue.  Trouble passing urine or not being able to pass urine at all. Once the infection has spread to the blood and a sepsis reaction starts, other symptoms may  include:  Chills with shaking.  Cold and clammy skin.  A fever of 101.55F (38.5C) or higher.  Low body temperature of 96.55F (36C) or lower.  Fast breathing.  Trouble breathing.  Fast heartbeat.  Severe pain in the abdomen.  Muscle aches.  Anxiety.  Problems staying awake.  Confusion.  Fainting. How is this diagnosed? This condition is diagnosed based on your symptoms, your medical history, and a physical exam. You may also have:  Urine tests or blood tests to check kidney function and to look for infection.  Imaging tests such as a CT scan or ultrasound to check for blockages in the urinary system. How is this treated? This condition is a medical emergency that needs to be treated right away in the hospital. This condition may be treated with:  An IV so that you can quickly receive: ? Antibiotic medicines. ? Fluids. ? Medicines to support blood pressure.  Oxygen and breathing support, if needed.  Removing a urinary catheter if it is the source of the infection, if this applies.  Filtering your blood with a machine (dialysis). This process cleans your blood if your kidneys have failed.  Surgery to drain infected areas or restore urine flow. This is rare. Follow these instructions at home: Medicines  Take over-the-counter and prescription medicines only as told by your health care provider.  If you were prescribed an antibiotic medicine, take it as told by your health care provider. Do not stop using the antibiotic even if you start to feel better. General instructions  Drink enough fluid to keep your urine pale yellow.  Return to your normal activities as told by your health care provider. Ask your health care provider what activities are safe for you.  Keep all follow-up visits as told by your health care provider. This is important.   Contact a health care provider if:  You have symptoms that get worse or do not get better with treatment.  You have  new UTI symptoms. Get help right away if:  You have new or continued symptoms of sepsis after hospitalization, such as: ? A fever of 101.55F (38.5C) or higher. ? Low body temperature of 96.55F (36C) or lower. ? Chills. ? Severe pain. ? Difficulty breathing. ? Confusion. ? Sleepiness. ? Nausea and vomiting. These symptoms may represent a serious problem that is an emergency. Do not wait to see if the symptoms will go away. Get medical help right away. Call your local emergency services (911 in the [place] Do not drive yourself to the hospital. Summary  Urosepsis is a type of sepsis. Sepsis is a  severe bodily reaction to an infection.  Urosepsis is a medical emergency that requires immediate treatment in a hospital.  Possible causes of urosepsis include a urinary tract infection that spreads to your blood, blockage from kidney stones, and prostate swelling or infection in males.  This condition may be treated with an IV so that you can quickly receive antibiotic medicines, fluids, and medicines to support your blood pressure. Other treatments may be used as well.  Get help right away if you have new or continued symptoms of sepsis after hospitalization. This information is not intended to replace advice given to you by your health care provider. Make sure you discuss any questions you have with your health care provider. Document Revised: 10/17/2018 Document Reviewed: 10/18/2018 Elsevier Patient Education  [place].

## 2021-02-15 NOTE — ED Provider Notes (Addendum)
Fever unknown source. ESRD dialysis. Well appearance. F/u 2nd trop and CT. Possible D/C if all negative.  08: 05 patient evaluation and check.  Patient reports he feels very poorly.  He reports he feels extremely weak.  Nursing had attempted to ambulate the patient to assess oxygen saturation with activity.  He reports patient was unable to ambulate due to generalized weakness.  Patient reports he feels short of breath and he continues to have a cough that has had for several weeks.  He reports it was first diagnosed as bronchitis.  He has not been on antibiotics.  He reports he had a trial of prednisone.  He reports it has been progressively worsening. Physical Exam  BP (!) 158/102   Pulse (!) 101   Temp 98.4 F (36.9 C) (Oral)   Resp (!) 28   Ht 6' (1.829 m)   Wt 73.5 kg   SpO2 93%   BMI 21.98 kg/m   Physical Exam Constitutional:      Comments: Alert but tachypneic and uncomfortable in appearance.  Well-nourished well-developed.  HENT:     Head: Normocephalic and atraumatic.     Mouth/Throat:     Pharynx: Oropharynx is clear.  Eyes:     Extraocular Movements: Extraocular movements intact.  Cardiovascular:     Comments: Borderline tachycardia.  2 out of 6 systolic ejection murmur Pulmonary:     Comments: Kidney a.  Speaking in full sentences.  Crackles right midlung field to base.  Left lung clear. Abdominal:     Comments: Soft abdomen.  Musculoskeletal:     Comments: Fistula graft in right upper thigh is pliable with good thrill and pulsation.  Palpable thrill in the femoral pulse.  No swelling or tenderness through the groin or upper thigh.  Patient has 2+ pitting edema of the right lower extremity from the calf to the foot.  1+ edema of the left lower extremity.  Skin:    Comments: Hot and dry to the touch.  Neurological:     Comments: Patient has clear mental status.  No focal neurologic deficits.     ED Course/Procedures     Procedures  MDM   Patient reassessed for  disposition.  At this time he is febrile with temperature up to 102 and tachypnea requiring oxygen.  Off oxygen saturation with good waveform was 89 to 94%.  Nursing staff reported oxygen saturation at 86% off oxygen with good waveform. heart Rate low 100s.  CT shows some patchy infiltrates suspicious for pneumonia.  Patient had cough for 3 weeks.  At this time patient meets sepsis criteria will initiate antibiotics and admission.  Patient denies any history of IV drug abuse.  Patient denies any drug abuse generally.  Patient denies he is currently sexually active but reports only sex with male partners.  Patient's last travel outside of the country is 20 years ago.  Patient has recurrently tested negative for COVID.  Will add broad respiratory panel.  Plan for admission for pneumonia with sepsis in patient with ESRD needing dialysis.  CRITICAL CARE Performed by: Charlesetta Shanks   Total critical care time: 30 minutes  Critical care time was exclusive of separately billable procedures and treating other patients.  Critical care was necessary to treat or prevent imminent or life-threatening deterioration.  Critical care was time spent personally by me on the following activities: development of treatment plan with patient and/or surrogate as well as nursing, discussions with consultants, evaluation of patient's response to treatment, examination of  patient, obtaining history from patient or surrogate, ordering and performing treatments and interventions, ordering and review of laboratory studies, ordering and review of radiographic studies, pulse oximetry and re-evaluation of patient's condition.  consult: Dr. Burnett Sheng nephrology will see the patient in the emergency department Consult: Dr. Lorin Mercy admitting for [facility] hospitalist     Charlesetta Shanks, MD 02/15/21 0830    Charlesetta Shanks, MD 02/15/21 [phone]

## 2021-02-15 NOTE — ED Provider Notes (Signed)
[facility] EMERGENCY DEPARTMENT Provider Note   CSN: [id number] Arrival date & time: 02/14/21  1914     History Chief Complaint  Patient presents with  . Fever  . Nausea    [name] is a 50 y.o. male.  Patient with history of ESRD on dialysis Monday, Wednesday Friday, chronic cough, hypertension here with fever, nausea and body aches for the past 1 week.  States he feels that he gets a fever at night on a daily basis but has not measured at home.  Fever was 102.5 on arrival to the ED.  Has a dry cough not able to cough up anything.  He was treated in the ED on May 10 for possible bronchitis and given prednisone and Tessalon Perles.  States he continues to have intermittent fever with nausea and aches.  Nausea but no vomiting.  No chest pain.  No abdominal pain.  Intermittent shortness of breath at times.  Denies any missed dialysis sessions.  He is due for dialysis this morning.  Does not make any urine.  He is COVID vaccinated. No redness or swelling to his graft.  No recent out of the country travel.  Has not left the [place] in 20 years.  The history is provided by the patient.  Fever Associated symptoms: chills, congestion, cough, headaches, myalgias and nausea   Associated symptoms: no chest pain, no rhinorrhea and no vomiting        Past Medical History:  Diagnosis Date  . Anemia   . Anxiety   . Cough    DRY    . Depression   . ESRD on hemodialysis ([place])    HD [facility] MWF  . Headache(784.0)   . Hypertension   . Muscle spasms of neck    BACK, NECK  . Pneumonia 2015ish  . Thyroid disease     Patient Active Problem List   Diagnosis Date Noted  . Problem with dialysis access ([place]) 06/15/2018  . Hyperkalemia   . Problem with vascular access 10/07/2016  . Pseudoaneurysm of arteriovenous graft ([place]) 11/06/2015  . Complication from renal dialysis device   . ESRD (end stage renal disease) on dialysis ([place])   . Secondary  hyperparathyroidism of renal origin ([place]) 04/24/2014  . Hyperparathyroidism, secondary ([place]) 03/21/2014    Past Surgical History:  Procedure Laterality Date  . ARTERIOVENOUS GRAFT PLACEMENT Left    "forearm, it's not working; thigh"   . ARTERIOVENOUS GRAFT PLACEMENT Left 11/09/2015  . AV FISTULA PLACEMENT Right 01/10/2017   Procedure: INSERTION OF ARTERIOVENOUS Right thigh GORE-TEX GRAFT;  Surgeon: [name], MD;  Location: [facility];  Service: Vascular;  Laterality: Right;  . FALSE ANEURYSM REPAIR Left 11/09/2015   Procedure: REPAIR OF LEFT FEMORAL PSEUDOANEURYSM; REVISION  OF LEFT THIGH ARTERIOVENOUS GRAFT USING 6MM X 10 CM GORETEX GRAFT ;  Surgeon: [name], MD;  Location: [facility] OR;  Service: Vascular;  Laterality: Left;  . IR AV DIALY SHUNT INTRO NEEDLE/INTRACATH INITIAL W/PTA/IMG RIGHT Right 02/06/2019  . IR AV DIALY SHUNT INTRO NEEDLE/INTRACATH INITIAL W/PTA/IMG RIGHT Right 05/14/2019  . IR AV DIALY SHUNT INTRO NEEDLE/INTRACATH INITIAL W/PTA/IMG RIGHT Right 08/18/2020  . IR DIALY SHUNT INTRO NEEDLE/INTRACATH INITIAL W/IMG RIGHT Right 09/17/2019  . IR DIALY SHUNT INTRO NEEDLE/INTRACATH INITIAL W/IMG RIGHT Right 12/10/2019  . IR GENERIC HISTORICAL  07/15/2016   IR US GUIDE VASC ACCESS LEFT 07/15/2016 [name], MD [facility]  . IR GENERIC HISTORICAL Left 07/15/2016   IR  THROMBECTOMY AV FISTULA W/THROMBOLYSIS/PTA INC/SHUNT/IMG LEFT 07/15/2016 [name], MD [facility]  . IR GENERIC HISTORICAL  10/05/2016   IR US GUIDE VASC ACCESS LEFT 10/05/2016 [name], MD [facility]  . IR GENERIC HISTORICAL Left 10/05/2016   IR THROMBECTOMY AV FISTULA W/THROMBOLYSIS/PTA INC/SHUNT/IMG LEFT 10/05/2016 [name], MD [facility]  . IR GENERIC HISTORICAL  10/08/2016   IR FLUORO GUIDE CV LINE RIGHT 10/08/2016 [name], MD [facility]  . IR GENERIC HISTORICAL  10/08/2016   IR US GUIDE VASC ACCESS RIGHT 10/08/2016 [name], MD [facility]  . IR PTA AND STENT ADDL CENTRAL DIALY SEG THRU  DIALY CIRCUIT RIGHT Right 12/10/2019  . IR RADIOLOGIST EVAL & MGMT  11/26/2019  . IR REMOVAL TUN CV CATH W/O FL  03/16/2017  . IR THROMBECTOMY AV FISTULA W/THROMBOLYSIS/PTA INC/SHUNT/IMG RIGHT Right 06/16/2018  . IR THROMBECTOMY AV FISTULA W/THROMBOLYSIS/PTA INC/SHUNT/IMG RIGHT Right 06/17/2018  . IR US GUIDE VASC ACCESS RIGHT  06/17/2018  . IR US GUIDE VASC ACCESS RIGHT  06/16/2018  . IR US GUIDE VASC ACCESS RIGHT  02/06/2019  . IR US GUIDE VASC ACCESS RIGHT  05/14/2019  . IR US GUIDE VASC ACCESS RIGHT  09/17/2019  . IR US GUIDE VASC ACCESS RIGHT  12/10/2019  . IR US GUIDE VASC ACCESS RIGHT  08/18/2020  . PARATHYROIDECTOMY N/A 04/24/2014   Procedure: TOTAL 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;  Surgeon: [name], MD;  Location: [place];  Service: General;  Laterality: N/A;  NECK AND LEFT FOREARM  . PERIPHERAL VASCULAR CATHETERIZATION N/A 05/05/2016   Procedure: A/V Shuntogram;  Surgeon: Conrad Arrow Rock, MD;  Location: [place] CV LAB;  Service: Cardiovascular;  Laterality: N/A;  . PERIPHERAL VASCULAR CATHETERIZATION Left 05/05/2016   Procedure: Peripheral Vascular Balloon Angioplasty;  Surgeon: Conrad Clearbrook, MD;  Location: [place] CV LAB;  Service: Cardiovascular;  Laterality: Left;  . PSEUDOANEURYSM REPAIR Left 11/09/2015  . THROMBECTOMY / ARTERIOVENOUS GRAFT REVISION Left 08/18/2015   thigh  . THROMBECTOMY AND REVISION OF ARTERIOVENTOUS (AV) GORETEX  GRAFT Left 08/23/2015   Procedure: THROMBECTOMY AND REVISION OF ARTERIOVENTOUS (AV) GORETEX  GRAFT LEFT THIGH ;  Surgeon: [name], MD;  Location: [facility];  Service: Vascular;  Laterality: Left;  . THROMBECTOMY W/ EMBOLECTOMY Left 08/18/2015   Procedure: THROMBECTOMY  AND REVISION ARTERIOVENOUS GORE-TEX GRAFT/LEFT THIGH;  Surgeon: [name], MD;  Location: [facility];  Service: Vascular;  Laterality: Left;  . THROMBECTOMY W/ EMBOLECTOMY Right 06/19/2018   Procedure: THROMBECTOMY AND REVISION OF RIGHT THIGH  ARTERIOVENOUS GORE-TEX  GRAFT;  Surgeon: [name], MD;  Location: [facility];  Service: Vascular;  Laterality: Right;  . UPPER EXTREMITY VENOGRAPHY Bilateral 12/27/2016   Procedure: Upper Extremity Venography;  Surgeon: [name], MD;  Location: [facility] CV LAB;  Service: Cardiovascular;  Laterality: Bilateral;  . VENOGRAM Left 05/05/2016   Procedure: Venogram;  Surgeon: Conrad Webb, MD;  Location: [place] CV LAB;  Service: Cardiovascular;  Laterality: Left;  lower extremity       Family History  Problem Relation Age of Onset  . Renal Disease Neg Hx     Social History   Tobacco Use  . Smoking status: Former Smoker    Quit date: 03/21/1986    Years since quitting: 34.9  . Smokeless tobacco: Never Used  Substance Use Topics  . Alcohol use: No    Alcohol/week: 0.0 standard drinks  . Drug use: No    Home Medications Prior to  Admission medications   Medication Sig Start Date End Date Taking? Authorizing Provider  acetaminophen (TYLENOL) 500 MG tablet Take 1,000 mg by mouth daily as needed (for pain or headaches).     [provider]  albuterol (VENTOLIN HFA) 108 (90 Base) MCG/ACT inhaler Inhale 1-2 puffs into the lungs every 6 (six) hours as needed for wheezing. 02/09/21   Larene Pickett, PA-C  amLODipine (NORVASC) 10 MG tablet Take 10 mg by mouth daily.    [provider]  B Complex-C-Folic Acid (DIALYVITE TABLET) TABS Take 1 tablet by mouth daily. 06/29/20   [provider]  benzonatate (TESSALON) 100 MG capsule Take 1 capsule (100 mg total) by mouth every 8 (eight) hours. 02/09/21   Larene Pickett, PA-C  hydrALAZINE (APRESOLINE) 25 MG tablet Take 25 mg by mouth 3 (three) times daily. 06/10/20   [provider]  HYDROcodone-acetaminophen (NORCO) 5-325 MG tablet Take 1 tablet by mouth every 4 (four) hours as needed (cough). [id number]   Delora Fuel, MD  lidocaine-prilocaine (EMLA) cream Apply 1 application topically daily as needed (port access).  02/03/20    [provider]  predniSONE (DELTASONE) 20 MG tablet Take 40 mg by mouth daily for 3 days, then '20mg'$  by mouth daily for 3 days, then '10mg'$  daily for 3 days 02/09/21   Larene Pickett, PA-C  TUMS ULTRA 1000 1000 MG chewable tablet Chew 4,000 mg by mouth 3 (three) times daily with meals. 03/14/20   [provider]  fluticasone (FLONASE) 50 MCG/ACT nasal spray Place 2 sprays into both nostrils daily. Patient not taking: Reported on 06/15/2018 07/25/17 02/20/20  Larene Pickett, PA-C    Allergies    Ivp dye [iodinated diagnostic agents], Lisinopril, and Solu-medrol [methylprednisolone]  Review of Systems   Review of Systems  Constitutional: Positive for activity change, appetite change, chills, fatigue and fever.  HENT: Positive for congestion. Negative for rhinorrhea.   Eyes: Negative for visual disturbance.  Respiratory: Positive for cough and shortness of breath.   Cardiovascular: Negative for chest pain.  Gastrointestinal: Positive for nausea. Negative for abdominal pain and vomiting.  Musculoskeletal: Positive for arthralgias and myalgias.  Neurological: Positive for headaches. Negative for dizziness and weakness.    all other systems are negative except as noted in the HPI and PMH.   Physical Exam Updated Vital Signs BP (!) 147/88 (BP Location: Right Arm)   Pulse 87   Temp 98.4 F (36.9 C) (Oral)   Resp 20   Ht 6' (1.829 m)   Wt 73.5 kg   SpO2 97%   BMI 21.98 kg/m   Physical Exam Vitals and nursing note reviewed.  Constitutional:      General: He is not in acute distress.    Appearance: He is well-developed.     Comments: Febrile, no distress  HENT:     Head: Normocephalic and atraumatic.     Mouth/Throat:     Pharynx: No oropharyngeal exudate.  Eyes:     Conjunctiva/sclera: Conjunctivae normal.     Pupils: Pupils are equal, round, and reactive to light.  Neck:     Comments: No meningismus. Cardiovascular:     Rate and Rhythm: Normal rate and regular  rhythm.     Heart sounds: Normal heart sounds. No murmur heard.   Pulmonary:     Effort: Pulmonary effort is normal. No respiratory distress.     Breath sounds: Normal breath sounds. No wheezing.  Abdominal:     Palpations: Abdomen is soft.  Tenderness: There is no abdominal tenderness. There is no guarding or rebound.  Musculoskeletal:        General: No tenderness. Normal range of motion.     Cervical back: Normal range of motion and neck supple.     Comments: Right upper thigh graft with thrill and bruit present.  No warmth or erythema  Skin:    General: Skin is warm.  Neurological:     Mental Status: He is alert and oriented to person, place, and time.     Cranial Nerves: No cranial nerve deficit.     Motor: No abnormal muscle tone.     Coordination: Coordination normal.     Comments: No ataxia on finger to nose bilaterally. No pronator drift. 5/5 strength throughout. CN 2-12 intact.Equal grip strength. Sensation intact.   Psychiatric:        Behavior: Behavior normal.     ED Results / Procedures / Treatments   Labs (all labs ordered are listed, but only abnormal results are displayed) Labs Reviewed  COMPREHENSIVE METABOLIC PANEL - Abnormal; Notable for the following components:      Result Value   Chloride 95 (*)    BUN 59 (*)    Creatinine, Ser 13.73 (*)    Calcium 7.3 (*)    Albumin 3.0 (*)    Total Bilirubin 1.4 (*)    GFR, Estimated 4 (*)    Anion gap 18 (*)    All other components within normal limits  CBC WITH DIFFERENTIAL/PLATELET - Abnormal; Notable for the following components:   RBC 3.36 (*)    Hemoglobin 10.0 (*)    HCT 31.7 (*)    RDW 16.8 (*)    Lymphs Abs 0.6 (*)    All other components within normal limits  TROPONIN I (HIGH SENSITIVITY) - Abnormal; Notable for the following components:   Troponin I (High Sensitivity) 77 (*)    All other components within normal limits  RESP PANEL BY RT-PCR (FLU A&B, COVID) ARPGX2  CULTURE, BLOOD (ROUTINE X  2)  CULTURE, BLOOD (ROUTINE X 2)  LACTIC ACID, PLASMA  LACTIC ACID, PLASMA  URINALYSIS, ROUTINE W REFLEX MICROSCOPIC  TROPONIN I (HIGH SENSITIVITY)    EKG EKG Interpretation  Date/Time:  Monday Feb 15 2021 05:07:04 EDT Ventricular Rate:  93 PR Interval:  172 QRS Duration: 84 QT Interval:  414 QTC Calculation: 515 R Axis:   19 Text Interpretation: Sinus rhythm Left ventricular hypertrophy Prolonged QT interval Rate faster Confirmed by Ezequiel Essex [phone]) on 02/15/2021 5:48:15 AM   Radiology CT ABDOMEN PELVIS WO CONTRAST  Result Date: 02/15/2021 CLINICAL DATA:  Fever of unknown origin EXAM: CT CHEST, ABDOMEN AND PELVIS WITHOUT CONTRAST TECHNIQUE: Multidetector CT imaging of the chest, abdomen and pelvis was performed following the standard protocol without IV contrast. COMPARISON:  12/13/2016 abdominal CT FINDINGS: CT CHEST FINDINGS Cardiovascular: Normal heart size. Border. Calcified fibrin sheath at the left brachiocephalic and right brachiocephalic veins, extending into the SVC. These vessels appear small caliber in places. Atheromatous calcification. The ascending aorta is 4 cm in diameter. Mediastinum/Nodes: No adenopathy or mass.  Parathyroidectomy changes Lungs/Pleura: Mild patchy subpleural opacity at the bases. These are small and more flat appearing than would be expected for infarct. Mild fissure and interlobular septal thickening at the bases. Musculoskeletal: No acute finding. CT ABDOMEN PELVIS FINDINGS Hepatobiliary: No focal liver abnormality.No evidence of biliary obstruction or stone. Pancreas: Unremarkable. Spleen: Unremarkable. Adrenals/Urinary Tract: Negative adrenals. Polycystic kidneys with atrophic intervening parenchyma. Multiple cysts with  mural calcifications. There also a few high-density proteinaceous appearing lesion/cysts. There is a notable low-density lesion in the left lower kidney measuring 3.7 cm with hazy or margins and internal isodensity. No  hydronephrosis. Negative collapsed bladder. Stomach/Bowel:  No obstruction. No visible bowel inflammation Vascular/Lymphatic: Diffuse arterial calcification. Dialysis access at the left thigh with a chronic collection that has decreased in size and increase in peripheral densities since 2018, deforming the adjacent emanating grafts. Grafts have been added to the right thigh since prior. Right iliac vein stenting. No mass or adenopathy. Reproductive:No pathologic findings. Other: No ascites or pneumoperitoneum. Musculoskeletal: Renal osteodystrophy.  No acute finding IMPRESSION: 1. Mild patchy bilateral airspace disease suspicious for atypical pneumonia. 2. Polycystic kidneys. Notable indistinct left renal cyst/lesion which may be related to recent intracystic hemorrhage or infection in this setting. 3. Cardiomegaly and trace pulmonary edema. 4. Chronic findings are described above. Electronically Signed   By: Monte Fantasia M.D.   On: 02/15/2021 07:17   DG Chest 2 View  Result Date: 02/14/2021 CLINICAL DATA:  Shortness of breath EXAM: CHEST - 2 VIEW COMPARISON:  Feb 09, 2021 FINDINGS: Mild cardiac enlargement, similar prior. No focal consolidation. No pleural effusion. No pneumothorax. Surgical clips overlie the neck. The visualized skeletal structures are unremarkable. IMPRESSION: No acute cardiopulmonary disease. Electronically Signed   By: Dahlia Bailiff MD   On: 02/14/2021 22:28   CT Chest Wo Contrast  Result Date: 02/15/2021 CLINICAL DATA:  Fever of unknown origin EXAM: CT CHEST, ABDOMEN AND PELVIS WITHOUT CONTRAST TECHNIQUE: Multidetector CT imaging of the chest, abdomen and pelvis was performed following the standard protocol without IV contrast. COMPARISON:  12/13/2016 abdominal CT FINDINGS: CT CHEST FINDINGS Cardiovascular: Normal heart size. Border. Calcified fibrin sheath at the left brachiocephalic and right brachiocephalic veins, extending into the SVC. These vessels appear small caliber in  places. Atheromatous calcification. The ascending aorta is 4 cm in diameter. Mediastinum/Nodes: No adenopathy or mass.  Parathyroidectomy changes Lungs/Pleura: Mild patchy subpleural opacity at the bases. These are small and more flat appearing than would be expected for infarct. Mild fissure and interlobular septal thickening at the bases. Musculoskeletal: No acute finding. CT ABDOMEN PELVIS FINDINGS Hepatobiliary: No focal liver abnormality.No evidence of biliary obstruction or stone. Pancreas: Unremarkable. Spleen: Unremarkable. Adrenals/Urinary Tract: Negative adrenals. Polycystic kidneys with atrophic intervening parenchyma. Multiple cysts with mural calcifications. There also a few high-density proteinaceous appearing lesion/cysts. There is a notable low-density lesion in the left lower kidney measuring 3.7 cm with hazy or margins and internal isodensity. No hydronephrosis. Negative collapsed bladder. Stomach/Bowel:  No obstruction. No visible bowel inflammation Vascular/Lymphatic: Diffuse arterial calcification. Dialysis access at the left thigh with a chronic collection that has decreased in size and increase in peripheral densities since 2018, deforming the adjacent emanating grafts. Grafts have been added to the right thigh since prior. Right iliac vein stenting. No mass or adenopathy. Reproductive:No pathologic findings. Other: No ascites or pneumoperitoneum. Musculoskeletal: Renal osteodystrophy.  No acute finding IMPRESSION: 1. Mild patchy bilateral airspace disease suspicious for atypical pneumonia. 2. Polycystic kidneys. Notable indistinct left renal cyst/lesion which may be related to recent intracystic hemorrhage or infection in this setting. 3. Cardiomegaly and trace pulmonary edema. 4. Chronic findings are described above. Electronically Signed   By: Monte Fantasia M.D.   On: 02/15/2021 07:17    Procedures Procedures   Medications Ordered in ED Medications  acetaminophen (TYLENOL) tablet  650 mg (650 mg Oral Given 02/14/21 2012)    ED Course  I  have reviewed the triage vital signs and the nursing notes.  Pertinent labs & imaging results that were available during my care of the patient were reviewed by me and considered in my medical decision making (see chart for details).    MDM Rules/Calculators/A&P                         Dialysis patient with 1 week of body aches, chills and fever.  Febrile on arrival.  Does not appear to be toxic or septic. Lungs are clear.  No increased work of breathing.  Chest x-ray is negative. Minimal troponin elevation 77. Potassium normal. Due for dialysis today.  Unclear etiology of patient's fever.  Does not appear to be toxic or septic.  He is due for dialysis today.  Fever has improved.  Chest x-ray shows no infiltrate. Given his dialysis status will obtain blood cultures.  He does not make urine.  COVID testing is negative.  CT scan obtained to evaluate for occult pneumonia.  Does appear to have patchy infiltrates bilaterally.  COVID testing today is negative.  Will start on azithromycin.  He is due for dialysis this morning.  Will attempt ambulation to see if he requires oxygen.  He did require some oxygen when he was sleeping overnight.  Anticipate he will be able to be discharged to dialysis with antibiotics if he is able to ambulate without desaturation.  Maria Abdou Gonsalez was evaluated in Emergency Department on 02/15/2021 for the symptoms described in the history of present illness. He was evaluated in the context of the global COVID-19 pandemic, which necessitated consideration that the patient might be at risk for infection with the SARS-CoV-2 virus that causes COVID-19. Institutional protocols and algorithms that pertain to the evaluation of patients at risk for COVID-19 are in a state of rapid change based on information released by regulatory bodies including the CDC and federal and state organizations. These policies and  algorithms were followed during the patient's care in the ED.  Final Clinical Impression(s) / ED Diagnoses Final diagnoses:  Fever, unspecified fever cause  ESRD (end stage renal disease) ([place])    Rx / [place] Orders ED Discharge Orders    None       Jencarlos Nicolson, Annie Main, MD 02/15/21 [phone]

## 2021-02-15 NOTE — ED Notes (Signed)
Pt O2 86% room air. Pt placed on 4L [place]. O2 up to 96%.

## 2021-02-16 DIAGNOSIS — B181 Chronic viral hepatitis B without delta-agent: Secondary | ICD-10-CM

## 2021-02-16 DIAGNOSIS — I10 Essential (primary) hypertension: Secondary | ICD-10-CM

## 2021-02-16 DIAGNOSIS — R7881 Bacteremia: Secondary | ICD-10-CM

## 2021-02-16 DIAGNOSIS — N186 End stage renal disease: Secondary | ICD-10-CM

## 2021-02-16 DIAGNOSIS — B955 Unspecified streptococcus as the cause of diseases classified elsewhere: Secondary | ICD-10-CM

## 2021-02-16 DIAGNOSIS — Z992 Dependence on renal dialysis: Secondary | ICD-10-CM

## 2021-02-16 LAB — BLOOD CULTURE ID PANEL (REFLEXED) - BCID2

## 2021-02-16 LAB — PHOSPHORUS: Phosphorus: 2.8 mg/dL (ref 2.5–4.6)

## 2021-02-16 LAB — CBC
HCT: 29.6 % — ABNORMAL LOW (ref 39.0–52.0)
Hemoglobin: 9.7 g/dL — ABNORMAL LOW (ref 13.0–17.0)
MCH: 29.5 pg (ref 26.0–34.0)
MCHC: 32.8 g/dL (ref 30.0–36.0)
MCV: 90 fL (ref 80.0–100.0)
Platelets: 252 10*3/uL (ref 150–400)
RBC: 3.29 MIL/uL — ABNORMAL LOW (ref 4.22–5.81)
RDW: 16.7 % — ABNORMAL HIGH (ref 11.5–15.5)
WBC: 6.8 10*3/uL (ref 4.0–10.5)
nRBC: 0 % (ref 0.0–0.2)

## 2021-02-16 LAB — BASIC METABOLIC PANEL
Anion gap: 17 — ABNORMAL HIGH (ref 5–15)
BUN: 85 mg/dL — ABNORMAL HIGH (ref 6–20)
CO2: 22 mmol/L (ref 22–32)
Calcium: 6.7 mg/dL — ABNORMAL LOW (ref 8.9–10.3)
Chloride: 95 mmol/L — ABNORMAL LOW (ref 98–111)
Creatinine, Ser: 17.01 mg/dL — ABNORMAL HIGH (ref 0.61–1.24)
GFR, Estimated: 3 mL/min — ABNORMAL LOW (ref >60)
Glucose, Bld: 102 mg/dL — ABNORMAL HIGH (ref 70–99)
Potassium: 4.6 mmol/L (ref 3.5–5.1)
Sodium: 134 mmol/L — ABNORMAL LOW (ref 135–145)

## 2021-02-16 MED ORDER — PENTAFLUOROPROP-TETRAFLUOROETH EX AERO: 1.0000 "application " | INHALATION_SPRAY | CUTANEOUS | Status: DC | PRN

## 2021-02-16 MED ORDER — CALCIUM CARBONATE ANTACID 500 MG PO CHEW
2.0000 | CHEWABLE_TABLET | Freq: Every day | ORAL | Status: DC
  Administered 2021-02-16 – 2021-02-18 (×3): 400 mg via ORAL
  Filled 2021-02-16 (×3): qty 2

## 2021-02-16 MED ORDER — SODIUM CHLORIDE 0.9 % IV SOLN: 100.0000 mL | INTRAVENOUS | Status: DC | PRN

## 2021-02-16 MED ORDER — DARBEPOETIN ALFA 60 MCG/0.3ML IJ SOSY: 60.0000 ug | PREFILLED_SYRINGE | INTRAMUSCULAR | Status: DC

## 2021-02-16 MED ORDER — HEPARIN SODIUM (PORCINE) 1000 UNIT/ML DIALYSIS: 1000.0000 [IU] | INTRAMUSCULAR | Status: DC | PRN

## 2021-02-16 MED ORDER — LIDOCAINE-PRILOCAINE 2.5-2.5 % EX CREA: 1.0000 "application " | TOPICAL_CREAM | CUTANEOUS | Status: DC | PRN

## 2021-02-16 MED ORDER — CALCIUM CARBONATE ANTACID 500 MG PO CHEW
2.0000 | CHEWABLE_TABLET | Freq: Three times a day (TID) | ORAL | Status: DC
  Administered 2021-02-16 – 2021-02-19 (×7): 400 mg via ORAL
  Filled 2021-02-16 (×7): qty 2

## 2021-02-16 MED ORDER — HEPARIN SODIUM (PORCINE) 1000 UNIT/ML DIALYSIS: 1500.0000 [IU] | INTRAMUSCULAR | Status: DC | PRN

## 2021-02-16 MED ORDER — CHLORHEXIDINE GLUCONATE CLOTH 2 % EX PADS
6.0000 | MEDICATED_PAD | Freq: Every day | CUTANEOUS | Status: DC
  Administered 2021-02-18 – 2021-02-19 (×2): 6 via TOPICAL

## 2021-02-16 MED ORDER — LIDOCAINE HCL (PF) 1 % IJ SOLN: 5.0000 mL | INTRAMUSCULAR | Status: DC | PRN

## 2021-02-16 MED ORDER — CALCITRIOL 0.25 MCG PO CAPS
0.2500 ug | ORAL_CAPSULE | ORAL | Status: DC
  Administered 2021-02-19: 0.25 ug via ORAL

## 2021-02-16 NOTE — Plan of Care (Signed)

## 2021-02-16 NOTE — Consult Note (Addendum)
[facility] Renal Consultation Note    Indication for Consultation:  Management of ESRD/hemodialysis; anemia, hypertension/volume and secondary hyperparathyroidism  [id number], No Pcp Per (Inactive)  HPI: [name] is a 50 y.o. male. ESRD on HD MWF at [facility].  Past medical history significant for HTN, Hep B, secondary hyperparathyroidism s/p parathyroidectomy in 2015, Hx recurrent bacteremia related to [facility] with prior septic emboli in 2011.  Of note patient is overall compliant with prescribed dialysis regimen.   Patient presents to the ED today due to fever, chills and cough x 2-3 weeks.  States he has 102F fever almost every afternoon for the last 2 weeks.  Denies sick contacts, CP, SOB, n/v/d, abdominal pain, weakness, dizziness and fatigue.  Admits to swelling in RLE, reports it is worsened following work.  States swelling in R leg usually occurs when hs has stenosis of thigh AVG and requires intervention.  Reports decreased appetite over the last few days but states he is hungry now.  Two ED visits prior to this due to fever, Dx with bronchitis and given steroids.    Pertinent findings on admission include tmax 102F, hypertension, tachypnea, CRP 14.3, sed rate 20, negative respiratory panel, neg HIV, CT chest/abd/pelvis with "mild patchy bilateral airspace disease suspicious for atypical pneumonia, trace pulmonary edema, Polycystic kidneys with notable indistinct left renal cyst/lesion which may be related to recent intracystic hemorrhage or infection in this setting."  Patient has been admitted for further evaluation and management.       Past Medical History:  Diagnosis Date  . Anemia   . Anxiety   . Cough    DRY    . Depression   . ESRD on hemodialysis ([place])    HD [facility] MWF  . Headache(784.0)   . Hypertension   . Muscle spasms of neck    BACK, NECK  . Pneumonia 2015ish  . Thyroid disease         Past Surgical History:   Procedure Laterality Date  . ARTERIOVENOUS GRAFT PLACEMENT Left    "forearm, it's not working; thigh"   . ARTERIOVENOUS GRAFT PLACEMENT Left 11/09/2015  . AV FISTULA PLACEMENT Right 01/10/2017   Procedure: INSERTION OF ARTERIOVENOUS Right thigh GORE-TEX GRAFT;  Surgeon: [name], MD;  Location: [place];  Service: Vascular;  Laterality: Right;  . FALSE ANEURYSM REPAIR Left 11/09/2015   Procedure: REPAIR OF LEFT FEMORAL PSEUDOANEURYSM; REVISION  OF LEFT THIGH ARTERIOVENOUS GRAFT USING 6MM X 10 CM GORETEX GRAFT ;  Surgeon: [name], MD;  Location: [facility] OR;  Service: Vascular;  Laterality: Left;  . IR AV DIALY SHUNT INTRO NEEDLE/INTRACATH INITIAL W/PTA/IMG RIGHT Right 02/06/2019  . IR AV DIALY SHUNT INTRO NEEDLE/INTRACATH INITIAL W/PTA/IMG RIGHT Right 05/14/2019  . IR AV DIALY SHUNT INTRO NEEDLE/INTRACATH INITIAL W/PTA/IMG RIGHT Right 08/18/2020  . IR DIALY SHUNT INTRO NEEDLE/INTRACATH INITIAL W/IMG RIGHT Right 09/17/2019  . IR DIALY SHUNT INTRO NEEDLE/INTRACATH INITIAL W/IMG RIGHT Right 12/10/2019  . IR GENERIC HISTORICAL  07/15/2016   IR US GUIDE VASC ACCESS LEFT 07/15/2016 [name], MD [facility]  . IR GENERIC HISTORICAL Left 07/15/2016   IR THROMBECTOMY AV FISTULA W/THROMBOLYSIS/PTA INC/SHUNT/IMG LEFT 07/15/2016 [name], MD [facility]  . IR GENERIC HISTORICAL  10/05/2016   IR US GUIDE VASC ACCESS LEFT 10/05/2016 [name], MD [facility]  . IR GENERIC HISTORICAL Left 10/05/2016   IR THROMBECTOMY AV FISTULA W/THROMBOLYSIS/PTA INC/SHUNT/IMG LEFT 10/05/2016 [name], MD [facility]  . IR GENERIC HISTORICAL  10/08/2016   IR FLUORO GUIDE CV LINE RIGHT 10/08/2016 [name], MD [facility]  . IR GENERIC HISTORICAL  10/08/2016   IR US GUIDE VASC ACCESS RIGHT 10/08/2016 [name], MD [facility]  . IR PTA AND STENT ADDL CENTRAL DIALY SEG THRU DIALY CIRCUIT RIGHT Right 12/10/2019  . IR RADIOLOGIST EVAL & MGMT  11/26/2019  . IR REMOVAL TUN CV CATH W/O FL  03/16/2017   . IR THROMBECTOMY AV FISTULA W/THROMBOLYSIS/PTA INC/SHUNT/IMG RIGHT Right 06/16/2018  . IR THROMBECTOMY AV FISTULA W/THROMBOLYSIS/PTA INC/SHUNT/IMG RIGHT Right 06/17/2018  . IR US GUIDE VASC ACCESS RIGHT  06/17/2018  . IR US GUIDE VASC ACCESS RIGHT  06/16/2018  . IR US GUIDE VASC ACCESS RIGHT  02/06/2019  . IR US GUIDE VASC ACCESS RIGHT  05/14/2019  . IR US GUIDE VASC ACCESS RIGHT  09/17/2019  . IR US GUIDE VASC ACCESS RIGHT  12/10/2019  . IR US GUIDE VASC ACCESS RIGHT  08/18/2020  . PARATHYROIDECTOMY N/A 04/24/2014   Procedure: TOTAL 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;  Surgeon: [name], MD;  Location: [place];  Service: General;  Laterality: N/A;  NECK AND LEFT FOREARM  . PERIPHERAL VASCULAR CATHETERIZATION N/A 05/05/2016   Procedure: A/V Shuntogram;  Surgeon: Conrad St. Paul, MD;  Location: [place] CV LAB;  Service: Cardiovascular;  Laterality: N/A;  . PERIPHERAL VASCULAR CATHETERIZATION Left 05/05/2016   Procedure: Peripheral Vascular Balloon Angioplasty;  Surgeon: Conrad Morgan, MD;  Location: [place] CV LAB;  Service: Cardiovascular;  Laterality: Left;  . PSEUDOANEURYSM REPAIR Left 11/09/2015  . THROMBECTOMY / ARTERIOVENOUS GRAFT REVISION Left 08/18/2015   thigh  . THROMBECTOMY AND REVISION OF ARTERIOVENTOUS (AV) GORETEX  GRAFT Left 08/23/2015   Procedure: THROMBECTOMY AND REVISION OF ARTERIOVENTOUS (AV) GORETEX  GRAFT LEFT THIGH ;  Surgeon: [name], MD;  Location: [place];  Service: Vascular;  Laterality: Left;  . THROMBECTOMY W/ EMBOLECTOMY Left 08/18/2015   Procedure: THROMBECTOMY  AND REVISION ARTERIOVENOUS GORE-TEX GRAFT/LEFT THIGH;  Surgeon: [name], MD;  Location: [place];  Service: Vascular;  Laterality: Left;  . THROMBECTOMY W/ EMBOLECTOMY Right 06/19/2018   Procedure: THROMBECTOMY AND REVISION OF RIGHT THIGH  ARTERIOVENOUS GORE-TEX GRAFT;  Surgeon: [name], MD;  Location: [place];  Service: Vascular;  Laterality: Right;  .  UPPER EXTREMITY VENOGRAPHY Bilateral 12/27/2016   Procedure: Upper Extremity Venography;  Surgeon: [name], MD;  Location: [place] CV LAB;  Service: Cardiovascular;  Laterality: Bilateral;  . VENOGRAM Left 05/05/2016   Procedure: Venogram;  Surgeon: Conrad Loganville, MD;  Location: [place] CV LAB;  Service: Cardiovascular;  Laterality: Left;  lower extremity        Family History  Problem Relation Age of Onset  . Renal Disease Neg Hx    Social History:  reports that he quit smoking about 34 years ago. He has never used smokeless tobacco. He reports that he does not drink alcohol and does not use drugs.      Allergies  Allergen Reactions  . Ivp Dye [Iodinated Diagnostic Agents] Swelling    SWELLING REACTION UNSPECIFIED   . Lisinopril Cough  . Solu-Medrol [Methylprednisolone] Nausea And Vomiting          Prior to Admission medications   Medication Sig Start Date End Date Taking? Authorizing Provider  acetaminophen (TYLENOL) 500 MG tablet Take 1,000 mg by mouth daily as needed (for pain or headaches).    Yes [provider]  albuterol (VENTOLIN HFA) 108 (  90 Base) MCG/ACT inhaler Inhale 1-2 puffs into the lungs every 6 (six) hours as needed for wheezing. 02/09/21  Yes Larene Pickett, PA-C  amLODipine (NORVASC) 10 MG tablet Take 10 mg by mouth daily.   Yes [provider]  azithromycin (ZITHROMAX) 250 MG tablet Take 1 tablet (250 mg total) by mouth daily. Take first 2 tablets together, then 1 every day until finished. 02/15/21  Yes Rancour, Annie Main, MD  B Complex-C-Folic Acid (DIALYVITE TABLET) TABS Take 1 tablet by mouth daily. 06/29/20  Yes [provider]  benzonatate (TESSALON) 100 MG capsule Take 1 capsule (100 mg total) by mouth every 8 (eight) hours. 02/09/21  Yes Larene Pickett, PA-C  hydrALAZINE (APRESOLINE) 50 MG tablet Take 50 mg by mouth 3 (three) times daily. 11/04/20  Yes [provider]  HYDROcodone-acetaminophen  (NORCO) 5-325 MG tablet Take 1 tablet by mouth every 4 (four) hours as needed (cough). Patient taking differently: Take 1 tablet by mouth every 4 (four) hours as needed for moderate pain (cough). [id number]  Yes Delora Fuel, MD  lidocaine-prilocaine (EMLA) cream Apply 1 application topically daily as needed (port access).  02/03/20  Yes [provider]  TUMS ULTRA 1000 1000 MG chewable tablet Chew 4,000 mg by mouth 3 (three) times daily with meals. 03/14/20  Yes [provider]  fluticasone (FLONASE) 50 MCG/ACT nasal spray Place 2 sprays into both nostrils daily. Patient not taking: Reported on 06/15/2018 07/25/17 02/20/20  Larene Pickett, PA-C            Current Facility-Administered Medications  Medication Dose Route Frequency Provider Last Rate Last Admin  . acetaminophen (TYLENOL) tablet 650 mg  650 mg Oral Q6H PRN Karmen Bongo, MD       Or  . acetaminophen (TYLENOL) suppository 650 mg  650 mg Rectal Q6H PRN Karmen Bongo, MD      . amLODipine (NORVASC) tablet 10 mg  10 mg Oral Daily Karmen Bongo, MD   10 mg at 02/15/21 1215  . [START ON 02/16/2021] azithromycin (ZITHROMAX) 500 mg in sodium chloride 0.9 % 250 mL IVPB  500 mg Intravenous Q24H Karmen Bongo, MD      . benzonatate (TESSALON) capsule 100 mg  100 mg Oral Lynne Logan, MD   100 mg at 02/15/21 1450  . calcium carbonate (dosed in mg elemental calcium) suspension 500 mg of elemental calcium  500 mg of elemental calcium Oral Q6H PRN Karmen Bongo, MD      . camphor-menthol [facility]) lotion 1 application  1 application Topical [id number] PRN Karmen Bongo, MD       And  . hydrOXYzine (ATARAX/VISTARIL) tablet 25 mg  25 mg Oral Q8H PRN Karmen Bongo, MD      . cefTRIAXone (ROCEPHIN) 2 g in sodium chloride 0.9 % 100 mL IVPB  2 g Intravenous Q24H Karmen Bongo, MD   Stopped at 02/15/21 1114  . docusate sodium (ENEMEEZ) enema 283 mg  1 enema Rectal PRN Karmen Bongo, MD      . feeding supplement (NEPRO  CARB STEADY) liquid 237 mL  237 mL Oral TID PRN Karmen Bongo, MD      . heparin injection 5,000 Units  5,000 Units Subcutaneous Lynne Logan, MD      . hydrALAZINE (APRESOLINE) tablet 50 mg  50 mg Oral TID Karmen Bongo, MD   50 mg at 02/15/21 1215  . HYDROcodone-acetaminophen (NORCO/VICODIN) 5-325 MG per tablet 1 tablet  1 tablet Oral Q4H PRN Karmen Bongo, MD      .  multivitamin (RENA-VIT) tablet 1 tablet  1 tablet Oral QHS Karmen Bongo, MD      . sodium chloride flush (NS) 0.9 % injection 3 mL  3 mL Intravenous Q12H Karmen Bongo, MD   3 mL at 02/15/21 1322  . sorbitol 70 % solution 30 mL  30 mL Oral PRN Karmen Bongo, MD      . zolpidem Lorrin Mais) tablet 5 mg  5 mg Oral QHS PRN Karmen Bongo, MD             Current Outpatient Medications  Medication Sig Dispense Refill  . acetaminophen (TYLENOL) 500 MG tablet Take 1,000 mg by mouth daily as needed (for pain or headaches).     Marland Kitchen albuterol (VENTOLIN HFA) 108 (90 Base) MCG/ACT inhaler Inhale 1-2 puffs into the lungs every 6 (six) hours as needed for wheezing. 1 each 0  . amLODipine (NORVASC) 10 MG tablet Take 10 mg by mouth daily.    Marland Kitchen azithromycin (ZITHROMAX) 250 MG tablet Take 1 tablet (250 mg total) by mouth daily. Take first 2 tablets together, then 1 every day until finished. 6 tablet 0  . B Complex-C-Folic Acid (DIALYVITE TABLET) TABS Take 1 tablet by mouth daily.    . benzonatate (TESSALON) 100 MG capsule Take 1 capsule (100 mg total) by mouth every 8 (eight) hours. 21 capsule 0  . hydrALAZINE (APRESOLINE) 50 MG tablet Take 50 mg by mouth 3 (three) times daily.    Marland Kitchen HYDROcodone-acetaminophen (NORCO) 5-325 MG tablet Take 1 tablet by mouth every 4 (four) hours as needed (cough). (Patient taking differently: Take 1 tablet by mouth every 4 (four) hours as needed for moderate pain (cough).) 6 tablet 0  . lidocaine-prilocaine (EMLA) cream Apply 1 application topically daily as needed (port access).     . TUMS  ULTRA 1000 1000 MG chewable tablet Chew 4,000 mg by mouth 3 (three) times daily with meals.              Facility-Administered Medications Ordered in Other Encounters  Medication Dose Route Frequency Provider Last Rate Last Admin  . 0.9 %  sodium chloride infusion   Intravenous Continuous Monia Sabal, PA-C       Labs: Basic Metabolic Panel: Last Labs      Recent Labs  Lab 02/14/21 2022  NA 138  K 4.9  CL 95*  CO2 25  GLUCOSE 84  BUN 59*  CREATININE 13.73*  CALCIUM 7.3*     Liver Function Tests: Last Labs   Recent Labs  Lab 02/14/21 2022  AST 15  ALT 16  ALKPHOS 94  BILITOT 1.4*  PROT 6.6  ALBUMIN 3.0*     CBC: Last Labs      Recent Labs  Lab 02/14/21 2022  WBC 8.3  NEUTROABS 7.0  HGB 10.0*  HCT 31.7*  MCV 94.3  PLT 265     Studies/Results:  Imaging Results (Last 48 hours)  CT ABDOMEN PELVIS WO CONTRAST  Result Date: 02/15/2021 CLINICAL DATA:  Fever of unknown origin EXAM: CT CHEST, ABDOMEN AND PELVIS WITHOUT CONTRAST TECHNIQUE: Multidetector CT imaging of the chest, abdomen and pelvis was performed following the standard protocol without IV contrast. COMPARISON:  12/13/2016 abdominal CT FINDINGS: CT CHEST FINDINGS Cardiovascular: Normal heart size. Border. Calcified fibrin sheath at the left brachiocephalic and right brachiocephalic veins, extending into the SVC. These vessels appear small caliber in places. Atheromatous calcification. The ascending aorta is 4 cm in diameter. Mediastinum/Nodes: No adenopathy or mass.  Parathyroidectomy changes Lungs/Pleura: Mild  patchy subpleural opacity at the bases. These are small and more flat appearing than would be expected for infarct. Mild fissure and interlobular septal thickening at the bases. Musculoskeletal: No acute finding. CT ABDOMEN PELVIS FINDINGS Hepatobiliary: No focal liver abnormality.No evidence of biliary obstruction or stone. Pancreas: Unremarkable. Spleen: Unremarkable. Adrenals/Urinary  Tract: Negative adrenals. Polycystic kidneys with atrophic intervening parenchyma. Multiple cysts with mural calcifications. There also a few high-density proteinaceous appearing lesion/cysts. There is a notable low-density lesion in the left lower kidney measuring 3.7 cm with hazy or margins and internal isodensity. No hydronephrosis. Negative collapsed bladder. Stomach/Bowel:  No obstruction. No visible bowel inflammation Vascular/Lymphatic: Diffuse arterial calcification. Dialysis access at the left thigh with a chronic collection that has decreased in size and increase in peripheral densities since 2018, deforming the adjacent emanating grafts. Grafts have been added to the right thigh since prior. Right iliac vein stenting. No mass or adenopathy. Reproductive:No pathologic findings. Other: No ascites or pneumoperitoneum. Musculoskeletal: Renal osteodystrophy.  No acute finding IMPRESSION: 1. Mild patchy bilateral airspace disease suspicious for atypical pneumonia. 2. Polycystic kidneys. Notable indistinct left renal cyst/lesion which may be related to recent intracystic hemorrhage or infection in this setting. 3. Cardiomegaly and trace pulmonary edema. 4. Chronic findings are described above. Electronically Signed   By: Monte Fantasia M.D.   On: 02/15/2021 07:17   DG Chest 2 View  Result Date: 02/14/2021 CLINICAL DATA:  Shortness of breath EXAM: CHEST - 2 VIEW COMPARISON:  Feb 09, 2021 FINDINGS: Mild cardiac enlargement, similar prior. No focal consolidation. No pleural effusion. No pneumothorax. Surgical clips overlie the neck. The visualized skeletal structures are unremarkable. IMPRESSION: No acute cardiopulmonary disease. Electronically Signed   By: Dahlia Bailiff MD   On: 02/14/2021 22:28   CT Chest Wo Contrast  Result Date: 02/15/2021 CLINICAL DATA:  Fever of unknown origin EXAM: CT CHEST, ABDOMEN AND PELVIS WITHOUT CONTRAST TECHNIQUE: Multidetector CT imaging of the chest, abdomen and pelvis  was performed following the standard protocol without IV contrast. COMPARISON:  12/13/2016 abdominal CT FINDINGS: CT CHEST FINDINGS Cardiovascular: Normal heart size. Border. Calcified fibrin sheath at the left brachiocephalic and right brachiocephalic veins, extending into the SVC. These vessels appear small caliber in places. Atheromatous calcification. The ascending aorta is 4 cm in diameter. Mediastinum/Nodes: No adenopathy or mass.  Parathyroidectomy changes Lungs/Pleura: Mild patchy subpleural opacity at the bases. These are small and more flat appearing than would be expected for infarct. Mild fissure and interlobular septal thickening at the bases. Musculoskeletal: No acute finding. CT ABDOMEN PELVIS FINDINGS Hepatobiliary: No focal liver abnormality.No evidence of biliary obstruction or stone. Pancreas: Unremarkable. Spleen: Unremarkable. Adrenals/Urinary Tract: Negative adrenals. Polycystic kidneys with atrophic intervening parenchyma. Multiple cysts with mural calcifications. There also a few high-density proteinaceous appearing lesion/cysts. There is a notable low-density lesion in the left lower kidney measuring 3.7 cm with hazy or margins and internal isodensity. No hydronephrosis. Negative collapsed bladder. Stomach/Bowel:  No obstruction. No visible bowel inflammation Vascular/Lymphatic: Diffuse arterial calcification. Dialysis access at the left thigh with a chronic collection that has decreased in size and increase in peripheral densities since 2018, deforming the adjacent emanating grafts. Grafts have been added to the right thigh since prior. Right iliac vein stenting. No mass or adenopathy. Reproductive:No pathologic findings. Other: No ascites or pneumoperitoneum. Musculoskeletal: Renal osteodystrophy.  No acute finding IMPRESSION: 1. Mild patchy bilateral airspace disease suspicious for atypical pneumonia. 2. Polycystic kidneys. Notable indistinct left renal cyst/lesion which may be related to  recent  intracystic hemorrhage or infection in this setting. 3. Cardiomegaly and trace pulmonary edema. 4. Chronic findings are described above. Electronically Signed   By: Monte Fantasia M.D.   On: 02/15/2021 07:17   VAS [place] LOWER EXTREMITY VENOUS (DVT) (ONLY [facility] & [facility] 7a-7p)  Result Date: 02/15/2021  Lower Venous DVT Study Patient Name:  JAVYON BUTLER  Date of Exam:   02/15/2021 Medical Rec #: [id number]              Accession #:    [id number] Date of Birth: [birth year]              Patient Gender: M Patient Age:   41Y Exam Location:  [facility] Procedure:      VAS [place] LOWER EXTREMITY VENOUS (DVT) Referring Phys: [id number] [facility] PFEIFFER --------------------------------------------------------------------------------  Indications: Edema.  Risk Factors: Right lower extremity HD access. Limitations: Poor ultrasound/tissue interface, coughing, HD access and body habitus. Comparison Study: No prior study Performing Technologist: Maudry Mayhew MHA, RDMS, RVT, RDCS  Examination Guidelines: A complete evaluation includes B-mode imaging, spectral Doppler, color Doppler, and power Doppler as needed of all accessible portions of each vessel. Bilateral testing is considered an integral part of a complete examination. Limited examinations for reoccurring indications may be performed as noted. The reflux portion of the exam is performed with the patient in reverse Trendelenburg.  +---------+---------------+---------+-----------+----------+--------------+ RIGHT    CompressibilityPhasicitySpontaneityPropertiesThrombus Aging +---------+---------------+---------+-----------+----------+--------------+ CFV      Full                    Yes                                 +---------+---------------+---------+-----------+----------+--------------+ SFJ      Full                                                        +---------+---------------+---------+-----------+----------+--------------+ FV  Prox  Full                                                        +---------+---------------+---------+-----------+----------+--------------+ FV Mid   Full                                                        +---------+---------------+---------+-----------+----------+--------------+ FV DistalFull                                                        +---------+---------------+---------+-----------+----------+--------------+ PFV      Full                                                        +---------+---------------+---------+-----------+----------+--------------+  POP      Full                    Yes                                 +---------+---------------+---------+-----------+----------+--------------+ PTV      Full                                                        +---------+---------------+---------+-----------+----------+--------------+ PERO     Full                                                        +---------+---------------+---------+-----------+----------+--------------+   Left Technical Findings: Not visualized segments include CFV.   Summary: RIGHT: - There is no evidence of deep vein thrombosis in the lower extremity.  - No cystic structure found in the popliteal fossa.   *See table(s) above for measurements and observations.    Preliminary      ROS: All others negative except those listed in HPI.  Physical Exam:       Vitals:   02/15/21 1530 02/15/21 1545 02/15/21 1600 02/15/21 1604  BP: 118/78 127/76 126/70   Pulse: 96 90 95   Resp:    18  Temp:    98.8 F (37.1 C)  TempSrc:    Oral  SpO2: 98% 99% 98%   Weight:      Height:         General: WDWN male in NAD Head: NCAT sclera not icteric MMM Neck: Supple. No lymphadenopathy Lungs: CTA bilaterally. No wheeze, rales or rhonchi. Breathing is unlabored on 2L via O2. Heart: RRR. No murmur, rubs or gallops.  Abdomen: soft, nontender, +BS, no  guarding, no rebound tenderness Lower extremities:1+ edema on R, trace edema on left Neuro: AAOx3. Moves all extremities spontaneously. Psych:  Responds to questions appropriately with a normal affect. Dialysis Access:  R thigh AVG +b/t  Dialysis Orders:  MWF - [facility]  3.75hrs, BFR 450, DFR 500,  EDW 73kg, 2K/ 2.5Ca P4  Access: R thigh AVG  Heparin 1500 unit bolus qHD Mircera 75 mcg q2wks - last 170mg on 02/03/21 Venofer '50mg'$  qHD Calcitriol 0.229m PO qHD  Assessment/Plan: 1. PNA - CT with opacities likely atypical PNA.  Resp panel negative. Started on ABX. BC pending. Per primary. 2.  ESRD -  On HD MWF.  No acute indications for emergent/urgent dialysis today.  Due to high patient census, will plan for HD tomorrow and resume regular schedule on Wednesday. 3.  Hypertension/volume  - Blood pressure elevated.  Continue home meds.  CT with trace pulmonary edema, plan for UF 2L as tolerated.  Possible weight loss with recent decreased appetite, close to dry per weights.  Get standing weights to better assess.  4.  Anemia of CKD - Hgb 10.0 ESA due Wednesday.  5.  Secondary Hyperparathyroidism -  S/p parathyroidectomy. CCa close to goal.  Will check phos, last low as OP.  Binders reduced to 2 TUMS AC TID last week.   6.  Nutrition - Renal diet  w/fluid restrictions.  7. Hep B 8.    Polycystic kidneys/indistinct left renal cyst/lesion - ?recent intracystic hemorrhage vs infection.  On ABX.  No abdominal symptoms.   Jen Mow, PA-C [facility] 02/15/2021, 4:07 PM          Cosigned by: Reesa Chew, MD at 02/15/2021 4:40 PM

## 2021-02-16 NOTE — Progress Notes (Signed)
PROGRESS NOTE  Natas Muscari Mcginnity  [id number] DOB: [birth year] DOA: 02/14/2021 PCP: Patient, No Pcp Per (Inactive)   Brief Narrative: [place] Nordland Jhaveri is a 50 y.o. male with a history of ESRD (MWF), HTN, hypothyroidism, depression/anxiety and previous TDC infection/bacteremia who presented to the ED 5/15 with fever and some cough found to have pulmonary infiltrates and blood cultures that ultimately grew strep species.   Assessment & Plan: Principal Problem:   Streptococcal bacteremia Active Problems:   ESRD (end stage renal disease) on dialysis [facility])   Essential hypertension   Chronic viral hepatitis B without delta-agent (HCC)  Sepsis due to Streptococcal bacteremia: AVG doesn't appear infected. Having  - ID consulted, TTE ordered.  - Narrow antibiotics to ceftriaxone 2g alone. Will monitor for susceptibilities.  - Repeat blood cultures 5/18  Indeterminate left renal lesion:  - IR consulted for consideration of aspiration.  Atypical pneumonia:  - Antibiotics as above.   ESRD: MWF thru femoral AVG.  - Appreciate nephrology assistance for HD. Today off-schedule and abbreviated session planned to return to routine schedule on 5/18 is planned. They plan to challenge EDW given pulmonary edema on CT.  HTN - Continue norvasc, hydralazine  DVT prophylaxis: Heparin  Code Status: Full Family Communication: None at bedside Disposition Plan:  Status is: Inpatient  Remains inpatient appropriate because:Ongoing diagnostic testing needed not appropriate for outpatient work up and Inpatient level of care appropriate due to severity of illness   Dispo: The patient is from: Home              Anticipated d/c is to: Home              Patient currently is not medically stable to d/c.   Difficult to place patient No  Consultants:   Nephrology  ID  IR  Procedures:   HD 02/16/2021  Antimicrobials:  Vancomycin, ceftriaxone, azithromycin   Subjective: Felt chills,  fever last night, still some cough but not severe. No dyspnea currently.   Objective: Vitals:   02/16/21 1116 02/16/21 1426 02/16/21 1500 02/16/21 1549  BP: (!) 159/88  140/75   Pulse: 86  99   Resp: 18  19   Temp: 97.9 F (36.6 C) 100.3 F (37.9 C) 99.9 F (37.7 C) 98.6 F (37 C)  TempSrc: Oral Oral Oral Oral  SpO2: 97%  97%   Weight:      Height:        Intake/Output Summary (Last 24 hours) at 02/16/2021 1633 Last data filed at 02/16/2021 1034 Gross per 24 hour  Intake 240 ml  Output 3000 ml  Net -2760 ml   Filed Weights   02/14/21 2007 02/16/21 0725 02/16/21 1034  Weight: 73.5 kg 76.6 kg 73.4 kg    Gen: 50 y.o. male in no distress Pulm: Non-labored breathing room air. Slight bibasilar crackles. CV: Regular rate and rhythm. No murmur, rub, or gallop. No JVD, no pitting pedal edema. GI: Abdomen soft, non-tender, non-distended, with normoactive bowel sounds. No organomegaly or masses felt. Ext: Warm, no deformities Skin: No rashes, lesions or ulcers, AVG accessed, nontender without exudate or erythema Neuro: Alert and oriented. No focal neurological deficits. Psych: Judgement and insight appear normal. Mood & affect appropriate.   Data Reviewed: I have personally reviewed following labs and imaging studies  CBC: Recent Labs  Lab 02/14/21 2022 02/16/21 0333  WBC 8.3 6.8  NEUTROABS 7.0  --   HGB 10.0* 9.7*  HCT 31.7* 29.6*  MCV 94.3 90.0  PLT 265  [id number]   Basic Metabolic Panel: Recent Labs  Lab 02/14/21 2022 02/16/21 0333  NA 138 134*  K 4.9 4.6  CL 95* 95*  CO2 25 22  GLUCOSE 84 102*  BUN 59* 85*  CREATININE 13.73* 17.01*  CALCIUM 7.3* 6.7*  PHOS  --  2.8   GFR: Estimated Creatinine Clearance: 5.5 mL/min (A) (by C-G formula based on SCr of 17.01 mg/dL (H)). Liver Function Tests: Recent Labs  Lab 02/14/21 2022  AST 15  ALT 16  ALKPHOS 94  BILITOT 1.4*  PROT 6.6  ALBUMIN 3.0*   No results for input(s): LIPASE, AMYLASE in the last 168  hours. No results for input(s): AMMONIA in the last 168 hours. Coagulation Profile: No results for input(s): INR, PROTIME in the last 168 hours. Cardiac Enzymes: No results for input(s): CKTOTAL, CKMB, CKMBINDEX, TROPONINI in the last 168 hours. BNP (last 3 results) No results for input(s): PROBNP in the last 8760 hours. HbA1C: No results for input(s): HGBA1C in the last 72 hours. CBG: No results for input(s): GLUCAP in the last 168 hours. Lipid Profile: No results for input(s): CHOL, HDL, LDLCALC, TRIG, CHOLHDL, LDLDIRECT in the last 72 hours. Thyroid Function Tests: No results for input(s): TSH, T4TOTAL, FREET4, T3FREE, THYROIDAB in the last 72 hours. Anemia Panel: No results for input(s): VITAMINB12, FOLATE, FERRITIN, TIBC, IRON, RETICCTPCT in the last 72 hours. Urine analysis:    Component Value Date/Time   COLORURINE YELLOW 08/01/2009 0742   APPEARANCEUR TURBID (A) 08/01/2009 0742   LABSPEC 1.018 08/01/2009 0742   PHURINE 5.5 08/01/2009 0742   GLUCOSEU NEGATIVE 08/01/2009 0742   HGBUR LARGE (A) 08/01/2009 0742   BILIRUBINUR SMALL (A) 08/01/2009 0742   KETONESUR 15 (A) 08/01/2009 0742   PROTEINUR 100 (A) 08/01/2009 0742   UROBILINOGEN 1.0 08/01/2009 0742   NITRITE NEGATIVE 08/01/2009 0742   LEUKOCYTESUR LARGE (A) 08/01/2009 0742   Recent Results (from the past 240 hour(s))  Resp Panel by RT-PCR (Flu A&B, Covid) Nasopharyngeal Swab     Status: None   Collection Time: 02/09/21  5:15 AM   Specimen: Nasopharyngeal Swab; Nasopharyngeal(NP) swabs in vial transport medium  Result Value Ref Range Status   SARS Coronavirus 2 by RT PCR NEGATIVE NEGATIVE Final    Comment: (NOTE) SARS-CoV-2 target nucleic acids are NOT DETECTED.  The SARS-CoV-2 RNA is generally detectable in upper respiratory specimens during the acute phase of infection. The lowest concentration of SARS-CoV-2 viral copies this assay can detect is 138 copies/mL. A negative result does not preclude  SARS-Cov-2 infection and should not be used as the sole basis for treatment or other patient management decisions. A negative result may occur with  improper specimen collection/handling, submission of specimen other than nasopharyngeal swab, presence of viral mutation(s) within the areas targeted by this assay, and inadequate number of viral copies(<138 copies/mL). A negative result must be combined with clinical observations, patient history, and epidemiological information. The expected result is Negative.  Fact Sheet for Patients:  EntrepreneurPulse.com.au  Fact Sheet for Healthcare Providers:  IncredibleEmployment.be  This test is no t yet approved or cleared by the [place] FDA and  has been authorized for detection and/or diagnosis of SARS-CoV-2 by FDA under an Emergency Use Authorization (EUA). This EUA will remain  in effect (meaning this test can be used) for the duration of the COVID-19 declaration under Section 564(b)(1) of the Act, 21 U.S.C.section 360bbb-3(b)(1), unless the authorization is terminated  or revoked sooner.       Influenza  A by PCR NEGATIVE NEGATIVE Final   Influenza B by PCR NEGATIVE NEGATIVE Final    Comment: (NOTE) The Xpert Xpress SARS-CoV-2/FLU/RSV plus assay is intended as an aid in the diagnosis of influenza from Nasopharyngeal swab specimens and should not be used as a sole basis for treatment. Nasal washings and aspirates are unacceptable for Xpert Xpress SARS-CoV-2/FLU/RSV testing.  Fact Sheet for Patients: EntrepreneurPulse.com.au  Fact Sheet for Healthcare Providers: IncredibleEmployment.be  This test is not yet approved or cleared by the [place] FDA and has been authorized for detection and/or diagnosis of SARS-CoV-2 by FDA under an Emergency Use Authorization (EUA). This EUA will remain in effect (meaning this test can be used) for the duration of  the COVID-19 declaration under Section 564(b)(1) of the Act, 21 U.S.C. section 360bbb-3(b)(1), unless the authorization is terminated or revoked.  Performed at [facility], [place] [address] [place], [place] [address]   Resp Panel by RT-PCR (Flu A&B, Covid) Nasopharyngeal Swab     Status: None   Collection Time: 02/14/21  8:10 PM   Specimen: Nasopharyngeal Swab; Nasopharyngeal(NP) swabs in vial transport medium  Result Value Ref Range Status   SARS Coronavirus 2 by RT PCR NEGATIVE NEGATIVE Final    Comment: (NOTE) SARS-CoV-2 target nucleic acids are NOT DETECTED.  The SARS-CoV-2 RNA is generally detectable in upper respiratory specimens during the acute phase of infection. The lowest concentration of SARS-CoV-2 viral copies this assay can detect is 138 copies/mL. A negative result does not preclude SARS-Cov-2 infection and should not be used as the sole basis for treatment or other patient management decisions. A negative result may occur with  improper specimen collection/handling, submission of specimen other than nasopharyngeal swab, presence of viral mutation(s) within the areas targeted by this assay, and inadequate number of viral copies(<138 copies/mL). A negative result must be combined with clinical observations, patient history, and epidemiological information. The expected result is Negative.  Fact Sheet for Patients:  EntrepreneurPulse.com.au  Fact Sheet for Healthcare Providers:  IncredibleEmployment.be  This test is no t yet approved or cleared by the [place] FDA and  has been authorized for detection and/or diagnosis of SARS-CoV-2 by FDA under an Emergency Use Authorization (EUA). This EUA will remain  in effect (meaning this test can be used) for the duration of the COVID-19 declaration under Section 564(b)(1) of the Act, 21 U.S.C.section 360bbb-3(b)(1), unless the authorization is terminated  or revoked sooner.        Influenza A by PCR NEGATIVE NEGATIVE Final   Influenza B by PCR NEGATIVE NEGATIVE Final    Comment: (NOTE) The Xpert Xpress SARS-CoV-2/FLU/RSV plus assay is intended as an aid in the diagnosis of influenza from Nasopharyngeal swab specimens and should not be used as a sole basis for treatment. Nasal washings and aspirates are unacceptable for Xpert Xpress SARS-CoV-2/FLU/RSV testing.  Fact Sheet for Patients: EntrepreneurPulse.com.au  Fact Sheet for Healthcare Providers: IncredibleEmployment.be  This test is not yet approved or cleared by the [place] FDA and has been authorized for detection and/or diagnosis of SARS-CoV-2 by FDA under an Emergency Use Authorization (EUA). This EUA will remain in effect (meaning this test can be used) for the duration of the COVID-19 declaration under Section 564(b)(1) of the Act, 21 U.S.C. section 360bbb-3(b)(1), unless the authorization is terminated or revoked.  Performed at [facility], [place] [address] [place], [place] [address]   Blood culture (routine x 2)     Status: None (Preliminary result)  Collection Time: 02/15/21  4:43 AM   Specimen: BLOOD RIGHT FOREARM  Result Value Ref Range Status   Specimen Description BLOOD RIGHT FOREARM  Final   Special Requests   Final    BOTTLES DRAWN AEROBIC AND ANAEROBIC Blood Culture results may not be optimal due to an inadequate volume of blood received in culture bottles   Culture  Setup Time   Final    GRAM POSITIVE COCCI IN CHAINS IN CLUSTERS IN BOTH AEROBIC AND ANAEROBIC BOTTLES CRITICAL RESULT CALLED TO, READ BACK BY AND VERIFIED WITH: PHARMD LAURA SEAY BY MESSAN H. AT 0012 ON 02/16/2021 Performed at [facility], [place] [address], [place], [place] [address]    Culture GRAM POSITIVE COCCI  Final   Report Status PENDING  Incomplete  Blood Culture ID Panel (Reflexed)     Status: Abnormal   Collection Time: 02/15/21  4:43 AM  Result  Value Ref Range Status   Enterococcus faecalis NOT DETECTED NOT DETECTED Final   Enterococcus Faecium NOT DETECTED NOT DETECTED Final   Listeria monocytogenes NOT DETECTED NOT DETECTED Final   Staphylococcus species NOT DETECTED NOT DETECTED Final   Staphylococcus aureus (BCID) NOT DETECTED NOT DETECTED Final   Staphylococcus epidermidis NOT DETECTED NOT DETECTED Final   Staphylococcus lugdunensis NOT DETECTED NOT DETECTED Final   Streptococcus species DETECTED (A) NOT DETECTED Final    Comment: Not Enterococcus species, Streptococcus agalactiae, Streptococcus pyogenes, or Streptococcus pneumoniae. CRITICAL RESULT CALLED TO, READ BACK BY AND VERIFIED WITH: PHARMD LAURA SEAY BY MESSAN H. AT 0012 ON 02/16/2021    Streptococcus agalactiae NOT DETECTED NOT DETECTED Final   Streptococcus pneumoniae NOT DETECTED NOT DETECTED Final   Streptococcus pyogenes NOT DETECTED NOT DETECTED Final   A.calcoaceticus-baumannii NOT DETECTED NOT DETECTED Final   Bacteroides fragilis NOT DETECTED NOT DETECTED Final   Enterobacterales NOT DETECTED NOT DETECTED Final   Enterobacter cloacae complex NOT DETECTED NOT DETECTED Final   Escherichia coli NOT DETECTED NOT DETECTED Final   Klebsiella aerogenes NOT DETECTED NOT DETECTED Final   Klebsiella oxytoca NOT DETECTED NOT DETECTED Final   Klebsiella pneumoniae NOT DETECTED NOT DETECTED Final   Proteus species NOT DETECTED NOT DETECTED Final   Salmonella species NOT DETECTED NOT DETECTED Final   Serratia marcescens NOT DETECTED NOT DETECTED Final   Haemophilus influenzae NOT DETECTED NOT DETECTED Final   Neisseria meningitidis NOT DETECTED NOT DETECTED Final   Pseudomonas aeruginosa NOT DETECTED NOT DETECTED Final   Stenotrophomonas maltophilia NOT DETECTED NOT DETECTED Final   Candida albicans NOT DETECTED NOT DETECTED Final   Candida auris NOT DETECTED NOT DETECTED Final   Candida glabrata NOT DETECTED NOT DETECTED Final   Candida krusei NOT DETECTED NOT  DETECTED Final   Candida parapsilosis NOT DETECTED NOT DETECTED Final   Candida tropicalis NOT DETECTED NOT DETECTED Final   Cryptococcus neoformans/gattii NOT DETECTED NOT DETECTED Final    Comment: Performed at [facility], [address] [place], [place] [address]  Blood culture (routine x 2)     Status: None (Preliminary result)   Collection Time: 02/15/21  8:41 AM   Specimen: BLOOD  Result Value Ref Range Status   Specimen Description BLOOD SITE NOT SPECIFIED  Final   Special Requests   Final    BOTTLES DRAWN AEROBIC AND ANAEROBIC Blood Culture results may not be optimal due to an inadequate volume of blood received in culture bottles   Culture  Setup Time   Final    GRAM POSITIVE  COCCI IN CHAINS IN CLUSTERS IN BOTH AEROBIC AND ANAEROBIC BOTTLES CRITICAL RESULT CALLED TO, READ BACK BY AND VERIFIED WITH: CRITICAL VALUE NOTED.  VALUE IS CONSISTENT WITH PREVIOUSLY REPORTED AND CALLED VALUE. Performed at [facility], [place] [address] [place], [place] [address]    Culture GRAM POSITIVE COCCI  Final   Report Status PENDING  Incomplete  Respiratory (~20 pathogens) panel by PCR     Status: None   Collection Time: 02/15/21  8:41 AM   Specimen: Nasopharyngeal Swab; Respiratory  Result Value Ref Range Status   Adenovirus NOT DETECTED NOT DETECTED Final   Coronavirus 229E NOT DETECTED NOT DETECTED Final    Comment: (NOTE) The Coronavirus on the Respiratory Panel, DOES NOT test for the novel  Coronavirus (2019 nCoV)    Coronavirus HKU1 NOT DETECTED NOT DETECTED Final   Coronavirus NL63 NOT DETECTED NOT DETECTED Final   Coronavirus OC43 NOT DETECTED NOT DETECTED Final   Metapneumovirus NOT DETECTED NOT DETECTED Final   Rhinovirus / Enterovirus NOT DETECTED NOT DETECTED Final   Influenza A NOT DETECTED NOT DETECTED Final   Influenza B NOT DETECTED NOT DETECTED Final   Parainfluenza Virus 1 NOT DETECTED NOT DETECTED Final   Parainfluenza Virus 2 NOT DETECTED NOT DETECTED  Final   Parainfluenza Virus 3 NOT DETECTED NOT DETECTED Final   Parainfluenza Virus 4 NOT DETECTED NOT DETECTED Final   Respiratory Syncytial Virus NOT DETECTED NOT DETECTED Final   Bordetella pertussis NOT DETECTED NOT DETECTED Final   Bordetella Parapertussis NOT DETECTED NOT DETECTED Final   Chlamydophila pneumoniae NOT DETECTED NOT DETECTED Final   Mycoplasma pneumoniae NOT DETECTED NOT DETECTED Final    Comment: Performed at [facility], [place]. [address] [place], [place] [address]      Radiology Studies: CT ABDOMEN PELVIS WO CONTRAST  Result Date: 02/15/2021 CLINICAL DATA:  Fever of unknown origin EXAM: CT CHEST, ABDOMEN AND PELVIS WITHOUT CONTRAST TECHNIQUE: Multidetector CT imaging of the chest, abdomen and pelvis was performed following the standard protocol without IV contrast. COMPARISON:  12/13/2016 abdominal CT FINDINGS: CT CHEST FINDINGS Cardiovascular: Normal heart size. Border. Calcified fibrin sheath at the left brachiocephalic and right brachiocephalic veins, extending into the SVC. These vessels appear small caliber in places. Atheromatous calcification. The ascending aorta is 4 cm in diameter. Mediastinum/Nodes: No adenopathy or mass.  Parathyroidectomy changes Lungs/Pleura: Mild patchy subpleural opacity at the bases. These are small and more flat appearing than would be expected for infarct. Mild fissure and interlobular septal thickening at the bases. Musculoskeletal: No acute finding. CT ABDOMEN PELVIS FINDINGS Hepatobiliary: No focal liver abnormality.No evidence of biliary obstruction or stone. Pancreas: Unremarkable. Spleen: Unremarkable. Adrenals/Urinary Tract: Negative adrenals. Polycystic kidneys with atrophic intervening parenchyma. Multiple cysts with mural calcifications. There also a few high-density proteinaceous appearing lesion/cysts. There is a notable low-density lesion in the left lower kidney measuring 3.7 cm with hazy or margins and internal isodensity. No  hydronephrosis. Negative collapsed bladder. Stomach/Bowel:  No obstruction. No visible bowel inflammation Vascular/Lymphatic: Diffuse arterial calcification. Dialysis access at the left thigh with a chronic collection that has decreased in size and increase in peripheral densities since 2018, deforming the adjacent emanating grafts. Grafts have been added to the right thigh since prior. Right iliac vein stenting. No mass or adenopathy. Reproductive:No pathologic findings. Other: No ascites or pneumoperitoneum. Musculoskeletal: Renal osteodystrophy.  No acute finding IMPRESSION: 1. Mild patchy bilateral airspace disease suspicious for atypical pneumonia. 2. Polycystic kidneys. Notable indistinct left renal cyst/lesion  which may be related to recent intracystic hemorrhage or infection in this setting. 3. Cardiomegaly and trace pulmonary edema. 4. Chronic findings are described above. Electronically Signed   By: Monte Fantasia M.D.   On: 02/15/2021 07:17   DG Chest 2 View  Result Date: 02/14/2021 CLINICAL DATA:  Shortness of breath EXAM: CHEST - 2 VIEW COMPARISON:  Feb 09, 2021 FINDINGS: Mild cardiac enlargement, similar prior. No focal consolidation. No pleural effusion. No pneumothorax. Surgical clips overlie the neck. The visualized skeletal structures are unremarkable. IMPRESSION: No acute cardiopulmonary disease. Electronically Signed   By: Dahlia Bailiff MD   On: 02/14/2021 22:28   CT Chest Wo Contrast  Result Date: 02/15/2021 CLINICAL DATA:  Fever of unknown origin EXAM: CT CHEST, ABDOMEN AND PELVIS WITHOUT CONTRAST TECHNIQUE: Multidetector CT imaging of the chest, abdomen and pelvis was performed following the standard protocol without IV contrast. COMPARISON:  12/13/2016 abdominal CT FINDINGS: CT CHEST FINDINGS Cardiovascular: Normal heart size. Border. Calcified fibrin sheath at the left brachiocephalic and right brachiocephalic veins, extending into the SVC. These vessels appear small caliber in  places. Atheromatous calcification. The ascending aorta is 4 cm in diameter. Mediastinum/Nodes: No adenopathy or mass.  Parathyroidectomy changes Lungs/Pleura: Mild patchy subpleural opacity at the bases. These are small and more flat appearing than would be expected for infarct. Mild fissure and interlobular septal thickening at the bases. Musculoskeletal: No acute finding. CT ABDOMEN PELVIS FINDINGS Hepatobiliary: No focal liver abnormality.No evidence of biliary obstruction or stone. Pancreas: Unremarkable. Spleen: Unremarkable. Adrenals/Urinary Tract: Negative adrenals. Polycystic kidneys with atrophic intervening parenchyma. Multiple cysts with mural calcifications. There also a few high-density proteinaceous appearing lesion/cysts. There is a notable low-density lesion in the left lower kidney measuring 3.7 cm with hazy or margins and internal isodensity. No hydronephrosis. Negative collapsed bladder. Stomach/Bowel:  No obstruction. No visible bowel inflammation Vascular/Lymphatic: Diffuse arterial calcification. Dialysis access at the left thigh with a chronic collection that has decreased in size and increase in peripheral densities since 2018, deforming the adjacent emanating grafts. Grafts have been added to the right thigh since prior. Right iliac vein stenting. No mass or adenopathy. Reproductive:No pathologic findings. Other: No ascites or pneumoperitoneum. Musculoskeletal: Renal osteodystrophy.  No acute finding IMPRESSION: 1. Mild patchy bilateral airspace disease suspicious for atypical pneumonia. 2. Polycystic kidneys. Notable indistinct left renal cyst/lesion which may be related to recent intracystic hemorrhage or infection in this setting. 3. Cardiomegaly and trace pulmonary edema. 4. Chronic findings are described above. Electronically Signed   By: Monte Fantasia M.D.   On: 02/15/2021 07:17   VAS [place] LOWER EXTREMITY VENOUS (DVT) (ONLY [facility] & [facility] 7a-7p)  Result Date: 02/15/2021  Lower Venous DVT  Study Patient Name:  RUHAAN TAUBMAN  Date of Exam:   02/15/2021 Medical Rec #: [id number]              Accession #:    [id number] Date of Birth: [birth year]              Patient Gender: M Patient Age:   77Y Exam Location:  [facility] Procedure:      VAS [place] LOWER EXTREMITY VENOUS (DVT) Referring Phys: [id number] [facility] PFEIFFER --------------------------------------------------------------------------------  Indications: Edema.  Risk Factors: Right lower extremity HD access. Limitations: Poor ultrasound/tissue interface, coughing, HD access and body habitus. Comparison Study: No prior study Performing Technologist: Maudry Mayhew MHA, RDMS, RVT, RDCS  Examination Guidelines: A complete evaluation includes B-mode imaging, spectral Doppler, color Doppler, and power Doppler as needed  of all accessible portions of each vessel. Bilateral testing is considered an integral part of a complete examination. Limited examinations for reoccurring indications may be performed as noted. The reflux portion of the exam is performed with the patient in reverse Trendelenburg.  +---------+---------------+---------+-----------+----------+--------------+ RIGHT    CompressibilityPhasicitySpontaneityPropertiesThrombus Aging +---------+---------------+---------+-----------+----------+--------------+ CFV      Full                    Yes                                 +---------+---------------+---------+-----------+----------+--------------+ SFJ      Full                                                        +---------+---------------+---------+-----------+----------+--------------+ FV Prox  Full                                                        +---------+---------------+---------+-----------+----------+--------------+ FV Mid   Full                                                        +---------+---------------+---------+-----------+----------+--------------+ FV DistalFull                                                         +---------+---------------+---------+-----------+----------+--------------+ PFV      Full                                                        +---------+---------------+---------+-----------+----------+--------------+ POP      Full                    Yes                                 +---------+---------------+---------+-----------+----------+--------------+ PTV      Full                                                        +---------+---------------+---------+-----------+----------+--------------+ PERO     Full                                                        +---------+---------------+---------+-----------+----------+--------------+  Left Technical Findings: Not visualized segments include CFV.   Summary: RIGHT: - There is no evidence of deep vein thrombosis in the lower extremity.  - No cystic structure found in the popliteal fossa.   *See table(s) above for measurements and observations. Electronically signed by Monica Martinez MD on 02/15/2021 at 4:46:49 PM.    Final     Scheduled Meds: . amLODipine  10 mg Oral Daily  . benzonatate  100 mg Oral Q8H  . [START ON 02/17/2021] calcitRIOL  0.25 mcg Oral Q M,W,F-HD  . calcium carbonate  2 tablet Oral TID WC  . calcium carbonate  2 tablet Oral QHS  . Chlorhexidine Gluconate Cloth  6 each Topical Q0600  . [START ON 02/17/2021] Chlorhexidine Gluconate Cloth  6 each Topical Q0600  . [START ON 02/17/2021] darbepoetin (ARANESP) injection - DIALYSIS  60 mcg Intravenous Q Wed-HD  . heparin  5,000 Units Subcutaneous Q8H  . hydrALAZINE  50 mg Oral TID  . multivitamin  1 tablet Oral QHS  . sodium chloride flush  3 mL Intravenous Q12H   Continuous Infusions: . cefTRIAXone (ROCEPHIN)  IV 2 g (02/16/21 1138)     LOS: 1 day   Time spent: 25 minutes.  Patrecia Pour, MD [facility] [email or web address] 02/16/2021, 4:33 PM

## 2021-02-16 NOTE — Progress Notes (Signed)
[name] Progress Note   Subjective:   Patient seen and examined at bedside in dialysis.  Tolerating well.  Reports breathing better. Denies CP, SOB, edema, orthopnea, n/v/d, weakness, dizziness and fatigue.  tmax 99.4 overnight.  Blood cultures positive for strept species. No tenderness, erythema or drainage from AVG per patient.   Objective Vitals:   02/16/21 0830 02/16/21 0900 02/16/21 0930 02/16/21 1000  BP: (!) 149/88 (!) 160/99 (!) 151/84 (!) 168/97  Pulse: 81 78 82 85  Resp: 18     Temp:      TempSrc:      SpO2:      Weight:      Height:       Physical Exam General:well appearing male in NAD Heart:RRR, no mrg Lungs:CTAB, nml WOB on RA Abdomen:soft, NTND Extremities:no LE edema Dialysis Access: R thigh AVG accessed, no erythema, warmth, edema or dischrage on exam  [facility] Weights   02/14/21 2007 02/16/21 0725  Weight: 73.5 kg 76.6 kg    Intake/Output Summary (Last 24 hours) at 02/16/2021 1052 Last data filed at 02/15/2021 1951 Gross per 24 hour  Intake 1132.5 ml  Output 0 ml  Net 1132.5 ml    Additional Objective Labs: Basic Metabolic Panel: Recent Labs  Lab 02/14/21 2022 02/16/21 0333  NA 138 134*  K 4.9 4.6  CL 95* 95*  CO2 25 22  GLUCOSE 84 102*  BUN 59* 85*  CREATININE 13.73* 17.01*  CALCIUM 7.3* 6.7*   Liver Function Tests: Recent Labs  Lab 02/14/21 2022  AST 15  ALT 16  ALKPHOS 94  BILITOT 1.4*  PROT 6.6  ALBUMIN 3.0*   CBC: Recent Labs  Lab 02/14/21 2022 02/16/21 0333  WBC 8.3 6.8  NEUTROABS 7.0  --   HGB 10.0* 9.7*  HCT 31.7* 29.6*  MCV 94.3 90.0  PLT 265 252   Blood Culture    Component Value Date/Time   SDES BLOOD SITE NOT SPECIFIED 02/15/2021 0841   SPECREQUEST  02/15/2021 0841    BOTTLES DRAWN AEROBIC AND ANAEROBIC Blood Culture results may not be optimal due to an inadequate volume of blood received in culture bottles   CULT GRAM POSITIVE COCCI 02/15/2021 0841   REPTSTATUS PENDING 02/15/2021 0841    Studies/Results: CT ABDOMEN PELVIS WO CONTRAST  Result Date: 02/15/2021 CLINICAL DATA:  Fever of unknown origin EXAM: CT CHEST, ABDOMEN AND PELVIS WITHOUT CONTRAST TECHNIQUE: Multidetector CT imaging of the chest, abdomen and pelvis was performed following the standard protocol without IV contrast. COMPARISON:  12/13/2016 abdominal CT FINDINGS: CT CHEST FINDINGS Cardiovascular: Normal heart size. Border. Calcified fibrin sheath at the left brachiocephalic and right brachiocephalic veins, extending into the SVC. These vessels appear small caliber in places. Atheromatous calcification. The ascending aorta is 4 cm in diameter. Mediastinum/Nodes: No adenopathy or mass.  Parathyroidectomy changes Lungs/Pleura: Mild patchy subpleural opacity at the bases. These are small and more flat appearing than would be expected for infarct. Mild fissure and interlobular septal thickening at the bases. Musculoskeletal: No acute finding. CT ABDOMEN PELVIS FINDINGS Hepatobiliary: No focal liver abnormality.No evidence of biliary obstruction or stone. Pancreas: Unremarkable. Spleen: Unremarkable. Adrenals/Urinary Tract: Negative adrenals. Polycystic kidneys with atrophic intervening parenchyma. Multiple cysts with mural calcifications. There also a few high-density proteinaceous appearing lesion/cysts. There is a notable low-density lesion in the left lower kidney measuring 3.7 cm with hazy or margins and internal isodensity. No hydronephrosis. Negative collapsed bladder. Stomach/Bowel:  No obstruction. No visible bowel inflammation Vascular/Lymphatic: Diffuse arterial calcification.  Dialysis access at the left thigh with a chronic collection that has decreased in size and increase in peripheral densities since 2018, deforming the adjacent emanating grafts. Grafts have been added to the right thigh since prior. Right iliac vein stenting. No mass or adenopathy. Reproductive:No pathologic findings. Other: No ascites or  pneumoperitoneum. Musculoskeletal: Renal osteodystrophy.  No acute finding IMPRESSION: 1. Mild patchy bilateral airspace disease suspicious for atypical pneumonia. 2. Polycystic kidneys. Notable indistinct left renal cyst/lesion which may be related to recent intracystic hemorrhage or infection in this setting. 3. Cardiomegaly and trace pulmonary edema. 4. Chronic findings are described above. Electronically Signed   By: Monte Fantasia M.D.   On: 02/15/2021 07:17   DG Chest 2 View  Result Date: 02/14/2021 CLINICAL DATA:  Shortness of breath EXAM: CHEST - 2 VIEW COMPARISON:  Feb 09, 2021 FINDINGS: Mild cardiac enlargement, similar prior. No focal consolidation. No pleural effusion. No pneumothorax. Surgical clips overlie the neck. The visualized skeletal structures are unremarkable. IMPRESSION: No acute cardiopulmonary disease. Electronically Signed   By: Dahlia Bailiff MD   On: 02/14/2021 22:28   CT Chest Wo Contrast  Result Date: 02/15/2021 CLINICAL DATA:  Fever of unknown origin EXAM: CT CHEST, ABDOMEN AND PELVIS WITHOUT CONTRAST TECHNIQUE: Multidetector CT imaging of the chest, abdomen and pelvis was performed following the standard protocol without IV contrast. COMPARISON:  12/13/2016 abdominal CT FINDINGS: CT CHEST FINDINGS Cardiovascular: Normal heart size. Border. Calcified fibrin sheath at the left brachiocephalic and right brachiocephalic veins, extending into the SVC. These vessels appear small caliber in places. Atheromatous calcification. The ascending aorta is 4 cm in diameter. Mediastinum/Nodes: No adenopathy or mass.  Parathyroidectomy changes Lungs/Pleura: Mild patchy subpleural opacity at the bases. These are small and more flat appearing than would be expected for infarct. Mild fissure and interlobular septal thickening at the bases. Musculoskeletal: No acute finding. CT ABDOMEN PELVIS FINDINGS Hepatobiliary: No focal liver abnormality.No evidence of biliary obstruction or stone. Pancreas:  Unremarkable. Spleen: Unremarkable. Adrenals/Urinary Tract: Negative adrenals. Polycystic kidneys with atrophic intervening parenchyma. Multiple cysts with mural calcifications. There also a few high-density proteinaceous appearing lesion/cysts. There is a notable low-density lesion in the left lower kidney measuring 3.7 cm with hazy or margins and internal isodensity. No hydronephrosis. Negative collapsed bladder. Stomach/Bowel:  No obstruction. No visible bowel inflammation Vascular/Lymphatic: Diffuse arterial calcification. Dialysis access at the left thigh with a chronic collection that has decreased in size and increase in peripheral densities since 2018, deforming the adjacent emanating grafts. Grafts have been added to the right thigh since prior. Right iliac vein stenting. No mass or adenopathy. Reproductive:No pathologic findings. Other: No ascites or pneumoperitoneum. Musculoskeletal: Renal osteodystrophy.  No acute finding IMPRESSION: 1. Mild patchy bilateral airspace disease suspicious for atypical pneumonia. 2. Polycystic kidneys. Notable indistinct left renal cyst/lesion which may be related to recent intracystic hemorrhage or infection in this setting. 3. Cardiomegaly and trace pulmonary edema. 4. Chronic findings are described above. Electronically Signed   By: Monte Fantasia M.D.   On: 02/15/2021 07:17   VAS [place] LOWER EXTREMITY VENOUS (DVT) (ONLY [facility] & [facility] 7a-7p)  Result Date: 02/15/2021  Lower Venous DVT Study Patient Name:  [name]  Date of Exam:   02/15/2021 Medical Rec #: [id number]              Accession #:    [id number] Date of Birth: 07-31-1971              Patient Gender: M Patient Age:  49Y Exam Location:  [facility] Procedure:      VAS [place] LOWER EXTREMITY VENOUS (DVT) Referring Phys: [id number] [facility] PFEIFFER --------------------------------------------------------------------------------  Indications: Edema.  Risk Factors: Right lower extremity HD access. Limitations:  Poor ultrasound/tissue interface, coughing, HD access and body habitus. Comparison Study: No prior study Performing Technologist: Maudry Mayhew MHA, RDMS, RVT, RDCS  Examination Guidelines: A complete evaluation includes B-mode imaging, spectral Doppler, color Doppler, and power Doppler as needed of all accessible portions of each vessel. Bilateral testing is considered an integral part of a complete examination. Limited examinations for reoccurring indications may be performed as noted. The reflux portion of the exam is performed with the patient in reverse Trendelenburg.  +---------+---------------+---------+-----------+----------+--------------+ RIGHT    CompressibilityPhasicitySpontaneityPropertiesThrombus Aging +---------+---------------+---------+-----------+----------+--------------+ CFV      Full                    Yes                                 +---------+---------------+---------+-----------+----------+--------------+ SFJ      Full                                                        +---------+---------------+---------+-----------+----------+--------------+ FV Prox  Full                                                        +---------+---------------+---------+-----------+----------+--------------+ FV Mid   Full                                                        +---------+---------------+---------+-----------+----------+--------------+ FV DistalFull                                                        +---------+---------------+---------+-----------+----------+--------------+ PFV      Full                                                        +---------+---------------+---------+-----------+----------+--------------+ POP      Full                    Yes                                 +---------+---------------+---------+-----------+----------+--------------+ PTV      Full                                                         +---------+---------------+---------+-----------+----------+--------------+  PERO     Full                                                        +---------+---------------+---------+-----------+----------+--------------+   Left Technical Findings: Not visualized segments include CFV.   Summary: RIGHT: - There is no evidence of deep vein thrombosis in the lower extremity.  - No cystic structure found in the popliteal fossa.   *See table(s) above for measurements and observations. Electronically signed by Monica Martinez MD on 02/15/2021 at 4:46:49 PM.    Final     Medications: . sodium chloride    . sodium chloride    . cefTRIAXone (ROCEPHIN)  IV Stopped (02/15/21 1114)   . amLODipine  10 mg Oral Daily  . benzonatate  100 mg Oral Q8H  . Chlorhexidine Gluconate Cloth  6 each Topical Q0600  . heparin  5,000 Units Subcutaneous Q8H  . hydrALAZINE  50 mg Oral TID  . multivitamin  1 tablet Oral QHS  . sodium chloride flush  3 mL Intravenous Q12H    Dialysis Orders: MWF -NW 3.75hrs, BFR450, DFR500, EDW 73kg,2K/2.5CaP4  Access:R thigh AVG Heparin1500 unit bolus qHD Mircera37mg q2wks - last 1058m on 02/03/21 Venofer '50mg'$  qHD Calcitriol0.2518mPO qHD  Assessment/Plan: 1. Hypoxia- improved today, on RA. CT with opacities likely atypical PNA but also trace pulmonary edema, will challenge dry weight while admitted. Resp panel negative. Started on ABX. BC pending. Per primary. 2. Bacteremia - +BC for strept species, final culture pending. Etiology unclear.  CT abdomen with indistinct L renal lesion suspicious for infection vs intracystic hemorrhage.  No abdominal pain or symptoms. AVG does not appear infected. On vanc and rocephin.  3. ESRD- On HD MWF. HD off schedule today due to high patient volume plan for truncated HD again tomorrow to get back on schedule.  4. Hypertension/volume- Blood pressure elevated. Continue home meds.  Likely weight loss with recent  decreased appetite, CT with trace pulmonary edema.  Challenge dry weight weight while admitted. 5. Anemiaof CKD- Hgb 9.7.  Aranesp 18m34mwk ordered with HD tomorrow.  6. Secondary Hyperparathyroidism -S/p parathyroidectomy. CCa close to goal. Will check phos, last low as OP. Binders reduced to 2 TUMS AC TID last week.  7. Nutrition- Renal diet w/fluid restrictions.  8. Hep B 8.Polycystic kidneys/indistinct left renal cyst/lesion- ?recent intracystic hemorrhagevsinfection. On ABX. No abdominal symptoms.   LindJen Mow-C [facility] 02/16/2021,10:52 AM  LOS: 1 day

## 2021-02-16 NOTE — Consult Note (Signed)
[facility] for Infectious Disease    Date of Admission:  02/14/2021      Total days of antibiotics 1   Vancomycin   Ceftriaxone   Azithromycin         Reason for Consult: Streptococcal bacteremia     Referring Provider: Bonner [name] Primary Care Provider: Patient, No Pcp Per (Inactive)     Assessment: [name] is a 50 y.o. male from [place] here for evaluation of 3 week history of malaise, fevers and non-productive cough with high-grade streptococcal bacteremia concerning for deeper infection. CXR does have some features concerning for multifocal No source to explain his bacteremia at this time - dentition looks good and subjectively without concerns. AVG in thigh functioning normally. No changes to bowels . Will await speciation and narrow to Ceftriaxone alone. Further recommendations to follow once PCN MIC is available. Will order a surface echo given concern for possible endocarditis. May need TEE if poor study. Repeat blood cultures in AM.   Positive hepatitis B surface antigen - recent LFTs normal aside from slightly elevated total bilirubin. Likely chronic inactive carrier state given geographic risk factors. Will check Hep B DNA, hep b eAg/Ab, Fibrotest for fibrosis staging, Hep C Ab, Hep A total Ab,  HIV non-reactive.     Plan: 1. Stop vanc / Azithro 2. Continue ceftriaxone 3. Follow micro from blood cultures 4. Repeat another set of blood cultures in AM 5. Surface echo ordered  6. Follow up Hep B markers, Hep C/A labs.   Principal Problem:   Sepsis due to pneumonia [facility]: [facility] Rhonda W) Active Problems:   ESRD (end stage renal disease) on dialysis [facility])   Essential hypertension   . amLODipine  10 mg Oral Daily  . benzonatate  100 mg Oral Q8H  . Chlorhexidine Gluconate Cloth  6 each Topical Q0600  . heparin  5,000 Units Subcutaneous Q8H  . hydrALAZINE  50 mg Oral TID  . multivitamin  1 tablet Oral QHS  . sodium chloride flush  3 mL Intravenous Q12H     HPI: [name] is a 50 y.o. male admitted from home for 3 weeks history of fatigue, fevers and malaise.   He reports progressive illness over the last 3 weeks to include fevers at night, fatigue, subjective weight loss, and dry cough. He has been dialysis dependent for 14 years now and has been compliant with treatment sessions. He has sought care for these complaints a few times in ER / UC. Was prescribed prednisone recently for concern over bronchitis, which did not help. COVID and Flu panels negative. In the ER this time he was noted to have fever, SIRS criteria and hypoxia that recovered on nasal cannula. Started on empiric CAP coverage and admitted for further work up. Blood cultures have returned with heavy growth of gram positive cocci in chains and BCID indicating a streptococcus species not identified on biofire. He feels much better on antibiotics and is wondering when he can go home. Denies any problems with teeth pain or recent procedures. He is not in the care of a dentist regularly. He denies any changes to bowels and denies any blood with BMs, pain with BMs.   [name] is originally from [place], has been in the [place] the last 20 years now. He says that he has a history of hepatitis b infection but has never in the past been on medications for this nor has been evaluated for this. He  works at a [employer]. Does not use alcohol or drugs. Non-smoker.    Review of Systems: Review of Systems  Constitutional: Positive for chills, fever, malaise/fatigue and weight loss.  HENT: Negative for tinnitus.   Eyes: Negative for blurred vision and photophobia.  Respiratory: Positive for cough. Negative for sputum production and shortness of breath.   Cardiovascular: Positive for chest pain (with coughing).  Gastrointestinal: Negative for diarrhea, nausea and vomiting.  Musculoskeletal: Negative for back pain and neck pain.  Skin: Negative for rash.  Neurological:  Negative for dizziness, weakness and headaches.     Past Medical History:  Diagnosis Date  . Anemia   . Anxiety   . Cough    DRY    . Depression   . ESRD on hemodialysis ([facility])    HD [facility] MWF  . Headache(784.0)   . Hypertension   . Muscle spasms of neck    BACK, NECK  . Pneumonia 2015ish  . Thyroid disease     Social History   Tobacco Use  . Smoking status: Former Smoker    Quit date: 03/21/1986    Years since quitting: 34.9  . Smokeless tobacco: Never Used  Substance Use Topics  . Alcohol use: No    Alcohol/week: 0.0 standard drinks  . Drug use: No    Family History  Problem Relation Age of Onset  . Renal Disease Neg Hx    Allergies  Allergen Reactions  . Ivp Dye [Iodinated Diagnostic Agents] Swelling    SWELLING REACTION UNSPECIFIED   . Lisinopril Cough  . Solu-Medrol [Methylprednisolone] Nausea And Vomiting    OBJECTIVE: Blood pressure (!) 168/97, pulse 85, temperature 98.3 F (36.8 C), temperature source Oral, resp. rate 18, height 6' (1.829 m), weight 76.6 kg, SpO2 96 %.  Physical Exam Vitals reviewed.  Constitutional:      Appearance: Normal appearance.     Comments: Resting in bed on HD   HENT:     Mouth/Throat:     Mouth: Mucous membranes are moist.     Pharynx: Oropharynx is clear.  Eyes:     Pupils: Pupils are equal, round, and reactive to light.  Cardiovascular:     Rate and Rhythm: Normal rate.     Heart sounds: No murmur heard.   Pulmonary:     Effort: Pulmonary effort is normal.     Breath sounds: Normal breath sounds.  Abdominal:     General: Bowel sounds are normal.     Palpations: Abdomen is soft.  Musculoskeletal:        General: Normal range of motion.     Cervical back: Normal range of motion.  Skin:    General: Skin is warm and dry.     Capillary Refill: Capillary refill takes less than 2 seconds.     Comments: RU thigh graft accessed and functioning well.   Neurological:     Mental Status: He is alert and  oriented to person, place, and time.  Psychiatric:        Mood and Affect: Mood normal.        Behavior: Behavior normal.     Lab Results Lab Results  Component Value Date   WBC 6.8 02/16/2021   HGB 9.7 (L) 02/16/2021   HCT 29.6 (L) 02/16/2021   MCV 90.0 02/16/2021   PLT 252 02/16/2021    Lab Results  Component Value Date   CREATININE 17.01 (H) 02/16/2021   BUN 85 (H) 02/16/2021  NA 134 (L) 02/16/2021   K 4.6 02/16/2021   CL 95 (L) 02/16/2021   CO2 22 02/16/2021    Lab Results  Component Value Date   ALT 16 02/14/2021   AST 15 02/14/2021   ALKPHOS 94 02/14/2021   BILITOT 1.4 (H) 02/14/2021     Microbiology: Recent Results (from the past 240 hour(s))  Resp Panel by RT-PCR (Flu A&B, Covid) Nasopharyngeal Swab     Status: None   Collection Time: 02/09/21  5:15 AM   Specimen: Nasopharyngeal Swab; Nasopharyngeal(NP) swabs in vial transport medium  Result Value Ref Range Status   SARS Coronavirus 2 by RT PCR NEGATIVE NEGATIVE Final    Comment: (NOTE) SARS-CoV-2 target nucleic acids are NOT DETECTED.  The SARS-CoV-2 RNA is generally detectable in upper respiratory specimens during the acute phase of infection. The lowest concentration of SARS-CoV-2 viral copies this assay can detect is 138 copies/mL. A negative result does not preclude SARS-Cov-2 infection and should not be used as the sole basis for treatment or other patient management decisions. A negative result may occur with  improper specimen collection/handling, submission of specimen other than nasopharyngeal swab, presence of viral mutation(s) within the areas targeted by this assay, and inadequate number of viral copies(<138 copies/mL). A negative result must be combined with clinical observations, patient history, and epidemiological information. The expected result is Negative.  Fact Sheet for Patients:  EntrepreneurPulse.com.au  Fact Sheet for Healthcare Providers:   IncredibleEmployment.be  This test is no t yet approved or cleared by the [place] FDA and  has been authorized for detection and/or diagnosis of SARS-CoV-2 by FDA under an Emergency Use Authorization (EUA). This EUA will remain  in effect (meaning this test can be used) for the duration of the COVID-19 declaration under Section 564(b)(1) of the Act, 21 U.S.C.section 360bbb-3(b)(1), unless the authorization is terminated  or revoked sooner.       Influenza A by PCR NEGATIVE NEGATIVE Final   Influenza B by PCR NEGATIVE NEGATIVE Final    Comment: (NOTE) The Xpert Xpress SARS-CoV-2/FLU/RSV plus assay is intended as an aid in the diagnosis of influenza from Nasopharyngeal swab specimens and should not be used as a sole basis for treatment. Nasal washings and aspirates are unacceptable for Xpert Xpress SARS-CoV-2/FLU/RSV testing.  Fact Sheet for Patients: EntrepreneurPulse.com.au  Fact Sheet for Healthcare Providers: IncredibleEmployment.be  This test is not yet approved or cleared by the [place] FDA and has been authorized for detection and/or diagnosis of SARS-CoV-2 by FDA under an Emergency Use Authorization (EUA). This EUA will remain in effect (meaning this test can be used) for the duration of the COVID-19 declaration under Section 564(b)(1) of the Act, 21 U.S.C. section 360bbb-3(b)(1), unless the authorization is terminated or revoked.  Performed at [facility], [place] [address], [place], [place] [address]   Resp Panel by RT-PCR (Flu A&B, Covid) Nasopharyngeal Swab     Status: None   Collection Time: 02/14/21  8:10 PM   Specimen: Nasopharyngeal Swab; Nasopharyngeal(NP) swabs in vial transport medium  Result Value Ref Range Status   SARS Coronavirus 2 by RT PCR NEGATIVE NEGATIVE Final    Comment: (NOTE) SARS-CoV-2 target nucleic acids are NOT DETECTED.  The SARS-CoV-2 RNA is generally detectable in  upper respiratory specimens during the acute phase of infection. The lowest concentration of SARS-CoV-2 viral copies this assay can detect is 138 copies/mL. A negative result does not preclude SARS-Cov-2 infection and should not be used as the sole  basis for treatment or other patient management decisions. A negative result may occur with  improper specimen collection/handling, submission of specimen other than nasopharyngeal swab, presence of viral mutation(s) within the areas targeted by this assay, and inadequate number of viral copies(<138 copies/mL). A negative result must be combined with clinical observations, patient history, and epidemiological information. The expected result is Negative.  Fact Sheet for Patients:  EntrepreneurPulse.com.au  Fact Sheet for Healthcare Providers:  IncredibleEmployment.be  This test is no t yet approved or cleared by the [place] FDA and  has been authorized for detection and/or diagnosis of SARS-CoV-2 by FDA under an Emergency Use Authorization (EUA). This EUA will remain  in effect (meaning this test can be used) for the duration of the COVID-19 declaration under Section 564(b)(1) of the Act, 21 U.S.C.section 360bbb-3(b)(1), unless the authorization is terminated  or revoked sooner.       Influenza A by PCR NEGATIVE NEGATIVE Final   Influenza B by PCR NEGATIVE NEGATIVE Final    Comment: (NOTE) The Xpert Xpress SARS-CoV-2/FLU/RSV plus assay is intended as an aid in the diagnosis of influenza from Nasopharyngeal swab specimens and should not be used as a sole basis for treatment. Nasal washings and aspirates are unacceptable for Xpert Xpress SARS-CoV-2/FLU/RSV testing.  Fact Sheet for Patients: EntrepreneurPulse.com.au  Fact Sheet for Healthcare Providers: IncredibleEmployment.be  This test is not yet approved or cleared by the [place] FDA and has been  authorized for detection and/or diagnosis of SARS-CoV-2 by FDA under an Emergency Use Authorization (EUA). This EUA will remain in effect (meaning this test can be used) for the duration of the COVID-19 declaration under Section 564(b)(1) of the Act, 21 U.S.C. section 360bbb-3(b)(1), unless the authorization is terminated or revoked.  Performed at [facility], [place] [address] [place], [place] [address]   Blood culture (routine x 2)     Status: None (Preliminary result)   Collection Time: 02/15/21  4:43 AM   Specimen: BLOOD RIGHT FOREARM  Result Value Ref Range Status   Specimen Description BLOOD RIGHT FOREARM  Final   Special Requests   Final    BOTTLES DRAWN AEROBIC AND ANAEROBIC Blood Culture results may not be optimal due to an inadequate volume of blood received in culture bottles   Culture  Setup Time   Final    GRAM POSITIVE COCCI IN CHAINS IN CLUSTERS IN BOTH AEROBIC AND ANAEROBIC BOTTLES CRITICAL RESULT CALLED TO, READ BACK BY AND VERIFIED WITH: PHARMD LAURA SEAY BY MESSAN H. AT 0012 ON 02/16/2021 Performed at [facility], [place] [address], [place], [place] [address]    Culture GRAM POSITIVE COCCI  Final   Report Status PENDING  Incomplete  Blood Culture ID Panel (Reflexed)     Status: Abnormal   Collection Time: 02/15/21  4:43 AM  Result Value Ref Range Status   Enterococcus faecalis NOT DETECTED NOT DETECTED Final   Enterococcus Faecium NOT DETECTED NOT DETECTED Final   Listeria monocytogenes NOT DETECTED NOT DETECTED Final   Staphylococcus species NOT DETECTED NOT DETECTED Final   Staphylococcus aureus (BCID) NOT DETECTED NOT DETECTED Final   Staphylococcus epidermidis NOT DETECTED NOT DETECTED Final   Staphylococcus lugdunensis NOT DETECTED NOT DETECTED Final   Streptococcus species DETECTED (A) NOT DETECTED Final    Comment: Not Enterococcus species, Streptococcus agalactiae, Streptococcus pyogenes, or Streptococcus pneumoniae. CRITICAL RESULT CALLED TO,  READ BACK BY AND VERIFIED WITH: PHARMD LAURA SEAY BY MESSAN H. AT 0012 ON 02/16/2021  Streptococcus agalactiae NOT DETECTED NOT DETECTED Final   Streptococcus pneumoniae NOT DETECTED NOT DETECTED Final   Streptococcus pyogenes NOT DETECTED NOT DETECTED Final   A.calcoaceticus-baumannii NOT DETECTED NOT DETECTED Final   Bacteroides fragilis NOT DETECTED NOT DETECTED Final   Enterobacterales NOT DETECTED NOT DETECTED Final   Enterobacter cloacae complex NOT DETECTED NOT DETECTED Final   Escherichia coli NOT DETECTED NOT DETECTED Final   Klebsiella aerogenes NOT DETECTED NOT DETECTED Final   Klebsiella oxytoca NOT DETECTED NOT DETECTED Final   Klebsiella pneumoniae NOT DETECTED NOT DETECTED Final   Proteus species NOT DETECTED NOT DETECTED Final   Salmonella species NOT DETECTED NOT DETECTED Final   Serratia marcescens NOT DETECTED NOT DETECTED Final   Haemophilus influenzae NOT DETECTED NOT DETECTED Final   Neisseria meningitidis NOT DETECTED NOT DETECTED Final   Pseudomonas aeruginosa NOT DETECTED NOT DETECTED Final   Stenotrophomonas maltophilia NOT DETECTED NOT DETECTED Final   Candida albicans NOT DETECTED NOT DETECTED Final   Candida auris NOT DETECTED NOT DETECTED Final   Candida glabrata NOT DETECTED NOT DETECTED Final   Candida krusei NOT DETECTED NOT DETECTED Final   Candida parapsilosis NOT DETECTED NOT DETECTED Final   Candida tropicalis NOT DETECTED NOT DETECTED Final   Cryptococcus neoformans/gattii NOT DETECTED NOT DETECTED Final    Comment: Performed at [facility], [address], [place], [place] [address]  Blood culture (routine x 2)     Status: None (Preliminary result)   Collection Time: 02/15/21  8:41 AM   Specimen: BLOOD  Result Value Ref Range Status   Specimen Description BLOOD SITE NOT SPECIFIED  Final   Special Requests   Final    BOTTLES DRAWN AEROBIC AND ANAEROBIC Blood Culture results may not be optimal due to an inadequate volume of blood  received in culture bottles   Culture  Setup Time   Final    GRAM POSITIVE COCCI IN CHAINS IN CLUSTERS IN BOTH AEROBIC AND ANAEROBIC BOTTLES CRITICAL RESULT CALLED TO, READ BACK BY AND VERIFIED WITH: CRITICAL VALUE NOTED.  VALUE IS CONSISTENT WITH PREVIOUSLY REPORTED AND CALLED VALUE. Performed at [facility], [place] [address], [place], [place] [address]    Culture GRAM POSITIVE COCCI  Final   Report Status PENDING  Incomplete  Respiratory (~20 pathogens) panel by PCR     Status: None   Collection Time: 02/15/21  8:41 AM   Specimen: Nasopharyngeal Swab; Respiratory  Result Value Ref Range Status   Adenovirus NOT DETECTED NOT DETECTED Final   Coronavirus 229E NOT DETECTED NOT DETECTED Final    Comment: (NOTE) The Coronavirus on the Respiratory Panel, DOES NOT test for the novel  Coronavirus (2019 nCoV)    Coronavirus HKU1 NOT DETECTED NOT DETECTED Final   Coronavirus NL63 NOT DETECTED NOT DETECTED Final   Coronavirus OC43 NOT DETECTED NOT DETECTED Final   Metapneumovirus NOT DETECTED NOT DETECTED Final   Rhinovirus / Enterovirus NOT DETECTED NOT DETECTED Final   Influenza A NOT DETECTED NOT DETECTED Final   Influenza B NOT DETECTED NOT DETECTED Final   Parainfluenza Virus 1 NOT DETECTED NOT DETECTED Final   Parainfluenza Virus 2 NOT DETECTED NOT DETECTED Final   Parainfluenza Virus 3 NOT DETECTED NOT DETECTED Final   Parainfluenza Virus 4 NOT DETECTED NOT DETECTED Final   Respiratory Syncytial Virus NOT DETECTED NOT DETECTED Final   Bordetella pertussis NOT DETECTED NOT DETECTED Final   Bordetella Parapertussis NOT DETECTED NOT DETECTED Final   Chlamydophila pneumoniae NOT  DETECTED NOT DETECTED Final   Mycoplasma pneumoniae NOT DETECTED NOT DETECTED Final    Comment: Performed at [facility], [place] [address], [place], [place] [address]    Janene Madeira, MSN, NP-C [facility] for Infectious [place] Cell: [phone] Pager:  [phone]  02/16/2021 10:40 AM

## 2021-02-16 NOTE — Progress Notes (Signed)
PHARMACY - PHYSICIAN COMMUNICATION CRITICAL VALUE ALERT - BLOOD CULTURE IDENTIFICATION (BCID)  [name] is an 50 y.o. male who presented to [facility] on 02/14/2021 with a chief complaint of FUO  Assessment:  3/4 BC with Streptococcus species  Name of physician (or Provider) Contacted: Mansy  Current antibiotics: Rocephin and azithromycin  Changes to prescribed antibiotics recommended:  none  No results found for this or any previous visit.  [name] 02/16/2021  12:15 AM

## 2021-02-16 NOTE — Plan of Care (Signed)

## 2021-02-17 ENCOUNTER — Inpatient Hospital Stay (HOSPITAL_COMMUNITY): Payer: Medicare Other

## 2021-02-17 DIAGNOSIS — R7881 Bacteremia: Secondary | ICD-10-CM

## 2021-02-17 LAB — CULTURE, BLOOD (ROUTINE X 2)

## 2021-02-17 LAB — RENAL FUNCTION PANEL
Albumin: 2.4 g/dL — ABNORMAL LOW (ref 3.5–5.0)
Anion gap: 12 (ref 5–15)
BUN: 44 mg/dL — ABNORMAL HIGH (ref 6–20)
CO2: 26 mmol/L (ref 22–32)
Calcium: 7.4 mg/dL — ABNORMAL LOW (ref 8.9–10.3)
Chloride: 97 mmol/L — ABNORMAL LOW (ref 98–111)
Creatinine, Ser: 11.66 mg/dL — ABNORMAL HIGH (ref 0.61–1.24)
GFR, Estimated: 5 mL/min — ABNORMAL LOW (ref >60)
Glucose, Bld: 94 mg/dL (ref 70–99)
Phosphorus: 2.5 mg/dL (ref 2.5–4.6)
Potassium: 3.7 mmol/L (ref 3.5–5.1)
Sodium: 135 mmol/L (ref 135–145)

## 2021-02-17 LAB — CBC
HCT: 31.3 % — ABNORMAL LOW (ref 39.0–52.0)
Hemoglobin: 10.2 g/dL — ABNORMAL LOW (ref 13.0–17.0)
MCH: 29.5 pg (ref 26.0–34.0)
MCHC: 32.6 g/dL (ref 30.0–36.0)
MCV: 90.5 fL (ref 80.0–100.0)
Platelets: 303 10*3/uL (ref 150–400)
RBC: 3.46 MIL/uL — ABNORMAL LOW (ref 4.22–5.81)
RDW: 16.5 % — ABNORMAL HIGH (ref 11.5–15.5)
WBC: 4.7 10*3/uL (ref 4.0–10.5)
nRBC: 0 % (ref 0.0–0.2)

## 2021-02-17 LAB — ECHOCARDIOGRAM COMPLETE
AR max vel: 4.04 cm2
AV Area VTI: 3.86 cm2
AV Area mean vel: 3.94 cm2
AV Mean grad: 8.5 mmHg
AV Peak grad: 15.2 mmHg
Ao pk vel: 1.95 m/s
Area-P 1/2: 5.23 cm2
Calc EF: 47.4 %
Height: 72 in
MV M vel: 5.36 m/s
MV Peak grad: 114.9 mmHg
MV VTI: 3.28 cm2
P 1/2 time: 351 msec
Radius: 0.4 cm
S' Lateral: 4.7 cm
Single Plane A2C EF: 53.2 %
Single Plane A4C EF: 40.3 %
Weight: 2567.92 oz

## 2021-02-17 LAB — HEPATITIS C ANTIBODY: HCV Ab: NONREACTIVE

## 2021-02-17 LAB — HEPATITIS A ANTIBODY, TOTAL: hep A Total Ab: REACTIVE — AB

## 2021-02-17 MED ORDER — ALTEPLASE 2 MG IJ SOLR: 2.0000 mg | Freq: Once | INTRAMUSCULAR | Status: DC | PRN

## 2021-02-17 MED ORDER — LIDOCAINE HCL (PF) 1 % IJ SOLN: 5.0000 mL | INTRAMUSCULAR | Status: DC | PRN

## 2021-02-17 MED ORDER — HEPARIN SODIUM (PORCINE) 1000 UNIT/ML DIALYSIS: 1000.0000 [IU] | INTRAMUSCULAR | Status: DC | PRN

## 2021-02-17 MED ORDER — CEFAZOLIN SODIUM-DEXTROSE 2-4 GM/100ML-% IV SOLN
2.0000 g | INTRAVENOUS | Status: DC
  Administered 2021-02-17 – 2021-02-19 (×2): 2 g via INTRAVENOUS
  Filled 2021-02-17 (×2): qty 100

## 2021-02-17 MED ORDER — SODIUM CHLORIDE 0.9 % IV SOLN: 100.0000 mL | INTRAVENOUS | Status: DC | PRN

## 2021-02-17 MED ORDER — LIDOCAINE-PRILOCAINE 2.5-2.5 % EX CREA: 1.0000 "application " | TOPICAL_CREAM | CUTANEOUS | Status: DC | PRN

## 2021-02-17 MED ORDER — PENTAFLUOROPROP-TETRAFLUOROETH EX AERO: 1.0000 "application " | INHALATION_SPRAY | CUTANEOUS | Status: DC | PRN

## 2021-02-17 MED ORDER — DARBEPOETIN ALFA 60 MCG/0.3ML IJ SOSY
PREFILLED_SYRINGE | INTRAMUSCULAR | Status: AC
  Administered 2021-02-17: 60 ug via INTRAVENOUS
  Filled 2021-02-17: qty 0.3

## 2021-02-17 MED ORDER — CALCITRIOL 0.25 MCG PO CAPS
ORAL_CAPSULE | ORAL | Status: AC
  Administered 2021-02-17: 0.25 ug via ORAL
  Filled 2021-02-17: qty 1

## 2021-02-17 MED ORDER — HEPARIN SODIUM (PORCINE) 1000 UNIT/ML DIALYSIS: 1500.0000 [IU] | INTRAMUSCULAR | Status: DC | PRN

## 2021-02-17 NOTE — Plan of Care (Signed)

## 2021-02-17 NOTE — Progress Notes (Signed)
[facility]  Date of Admission:  02/14/2021      Total days of antibiotics 2   Ceftriaxone 5/16 >> 5/17  Vancomycin 5/16 >> 5/16  Azithromycin  5/16 >> 5/16  Cefazolin 5/18 >> current           ASSESSMENT: [name] is a 50 y.o. male with subacute history of fevers, chills and malaise x 3 weeks growing streptococcus cristatus in his blood 4/4 bottles. Viridans group streptococcus that has been linked to endocarditis and associated with oral cavity pathogen. Would check panorex for him to ensure no intervention needed. TTE has not been done yet. Repeat blood cultures are pending for him. No murmur appreciated on exam, +extra systoles, PACs on telemetry review w/o blocks.   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 Will follow results of ordered/pending tests.  I advised he will be here several more days for work up to help reassure treatment plan for him.   Hep b studies pending - presume this will be chronic infection. Will follow up and help address outpatient. LFTs normal.   Renal lesion - unable to sample with IR. Will see what his TTE shows before we consider any nuclear study.     PLAN: 1. Follow repeat blood cultures  2. Follow TTE 3. Panorex ordered  4. Narrow to cefazolin post HD   Principal Problem:   Streptococcal bacteremia Active Problems:   ESRD (end stage renal disease) on dialysis [facility])   Essential hypertension   Chronic viral hepatitis B without delta-agent ([place])   . amLODipine  10 mg Oral Daily  . benzonatate  100 mg Oral Q8H  . calcitRIOL  0.25 mcg Oral Q M,W,F-HD  . calcium carbonate  2 tablet Oral TID WC  . calcium carbonate  2 tablet Oral QHS  . Chlorhexidine Gluconate Cloth  6 each Topical Q0600  . Chlorhexidine Gluconate Cloth  6 each Topical Q0600  . darbepoetin (ARANESP) injection - DIALYSIS  60 mcg Intravenous Q Wed-HD  . heparin  5,000 Units Subcutaneous Q8H  . hydrALAZINE  50 mg Oral  TID  . multivitamin  1 tablet Oral QHS  . sodium chloride flush  3 mL Intravenous Q12H    SUBJECTIVE: Feels about the same. States he had fever and chills overnight. No new complaints.  Concerned he needs to go home to keep his job.  Wondering about the lesion on his kidney and if it is related to "his virus"     Review of Systems: Review of Systems  Constitutional: Positive for chills, fever and malaise/fatigue.  Respiratory: Negative for cough and shortness of breath.   Cardiovascular: Negative for chest pain and leg swelling.  Gastrointestinal: Negative for abdominal pain, diarrhea and vomiting.  Musculoskeletal: Negative for back pain and joint pain.  Skin: Negative for rash.  Neurological: Negative for dizziness, weakness and headaches.    Allergies  Allergen Reactions  . Ivp Dye [Iodinated Diagnostic Agents] Swelling    SWELLING REACTION UNSPECIFIED   . Lisinopril Cough  . Solu-Medrol [Methylprednisolone] Nausea And Vomiting    OBJECTIVE: Vitals:   02/17/21 1000 02/17/21 1030 02/17/21 1100 02/17/21 1118  BP: (!) 155/94 (!) 154/94 (!) 168/96 (!) 166/90  Pulse: 82 73 77 77  Resp:    (!) 21  Temp:    98.2 F (36.8 C)  TempSrc:    Oral  SpO2:    98%  Weight:    72.8 kg  Height:  Body mass index is 21.77 kg/m.  Physical Exam Vitals and nursing note reviewed.  Constitutional:      Appearance: He is well-developed.     Comments: Resting quietly undergoing hemodialysis session   HENT:     Mouth/Throat:     Dentition: Normal dentition. No dental abscesses.  Cardiovascular:     Rate and Rhythm: Normal rate and regular rhythm. Frequent extrasystoles are present.    Heart sounds: Normal heart sounds.     Comments: No obvious murmurs heard Pulmonary:     Effort: Pulmonary effort is normal.     Breath sounds: Normal breath sounds.  Abdominal:     General: There is no distension.     Palpations: Abdomen is soft.     Tenderness: There is no abdominal  tenderness.  Lymphadenopathy:     Cervical: No cervical adenopathy.  Skin:    General: Skin is warm and dry.     Findings: No rash.  Neurological:     Mental Status: He is alert and oriented to person, place, and time.  Psychiatric:        Judgment: Judgment normal.     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   WBC 4.7 02/17/2021   HGB 10.2 (L) 02/17/2021   HCT 31.3 (L) 02/17/2021   MCV 90.5 02/17/2021   PLT 303 02/17/2021    Lab Results  Component Value Date   CREATININE 11.66 (H) 02/17/2021   BUN 44 (H) 02/17/2021   NA 135 02/17/2021   K 3.7 02/17/2021   CL 97 (L) 02/17/2021   CO2 26 02/17/2021    Lab Results  Component Value Date   ALT 16 02/14/2021   AST 15 02/14/2021   ALKPHOS 94 02/14/2021   BILITOT 1.4 (H) 02/14/2021     Microbiology: Recent Results (from the past 240 hour(s))  Resp Panel by RT-PCR (Flu A&B, Covid) Nasopharyngeal Swab     Status: None   Collection Time: 02/09/21  5:15 AM   Specimen: Nasopharyngeal Swab; Nasopharyngeal(NP) swabs in vial transport medium  Result Value Ref Range Status   SARS Coronavirus 2 by RT PCR NEGATIVE NEGATIVE Final    Comment: (NOTE) SARS-CoV-2 target nucleic acids are NOT DETECTED.  The SARS-CoV-2 RNA is generally detectable in upper respiratory specimens during the acute phase of infection. The lowest concentration of SARS-CoV-2 viral copies this assay can detect is 138 copies/mL. A negative result does not preclude SARS-Cov-2 infection and should not be used as the sole basis for treatment or other patient management decisions. A negative result may occur with  improper specimen collection/handling, submission of specimen other than nasopharyngeal swab, presence of viral mutation(s) within the areas targeted by this assay, and inadequate number of viral copies(<138 copies/mL). A negative result must be combined with clinical observations, patient history, and  epidemiological information. The expected result is Negative.  Fact Sheet for Patients:  EntrepreneurPulse.com.au  Fact Sheet for Healthcare Providers:  IncredibleEmployment.be  This test is no t yet approved or cleared by the [place] FDA and  has been authorized for detection and/or diagnosis of SARS-CoV-2 by FDA under an Emergency Use Authorization (EUA). This EUA will remain  in effect (meaning this test can be used) for the duration of the COVID-19 declaration under Section 564(b)(1) of the Act, 21 U.S.C.section 360bbb-3(b)(1), unless the authorization is terminated  or revoked sooner.       Influenza A by PCR NEGATIVE NEGATIVE Final  Influenza B by PCR NEGATIVE NEGATIVE Final    Comment: (NOTE) The Xpert Xpress SARS-CoV-2/FLU/RSV plus assay is intended as an aid in the diagnosis of influenza from Nasopharyngeal swab specimens and should not be used as a sole basis for treatment. Nasal washings and aspirates are unacceptable for Xpert Xpress SARS-CoV-2/FLU/RSV testing.  Fact Sheet for Patients: EntrepreneurPulse.com.au  Fact Sheet for Healthcare Providers: IncredibleEmployment.be  This test is not yet approved or cleared by the [place] FDA and has been authorized for detection and/or diagnosis of SARS-CoV-2 by FDA under an Emergency Use Authorization (EUA). This EUA will remain in effect (meaning this test can be used) for the duration of the COVID-19 declaration under Section 564(b)(1) of the Act, 21 U.S.C. section 360bbb-3(b)(1), unless the authorization is terminated or revoked.  Performed at [facility], [place] [address] [place], [place] [address]   Resp Panel by RT-PCR (Flu A&B, Covid) Nasopharyngeal Swab     Status: None   Collection Time: 02/14/21  8:10 PM   Specimen: Nasopharyngeal Swab; Nasopharyngeal(NP) swabs in vial transport medium  Result Value Ref Range Status    SARS Coronavirus 2 by RT PCR NEGATIVE NEGATIVE Final    Comment: (NOTE) SARS-CoV-2 target nucleic acids are NOT DETECTED.  The SARS-CoV-2 RNA is generally detectable in upper respiratory specimens during the acute phase of infection. The lowest concentration of SARS-CoV-2 viral copies this assay can detect is 138 copies/mL. A negative result does not preclude SARS-Cov-2 infection and should not be used as the sole basis for treatment or other patient management decisions. A negative result may occur with  improper specimen collection/handling, submission of specimen other than nasopharyngeal swab, presence of viral mutation(s) within the areas targeted by this assay, and inadequate number of viral copies(<138 copies/mL). A negative result must be combined with clinical observations, patient history, and epidemiological information. The expected result is Negative.  Fact Sheet for Patients:  EntrepreneurPulse.com.au  Fact Sheet for Healthcare Providers:  IncredibleEmployment.be  This test is no t yet approved or cleared by the [place] FDA and  has been authorized for detection and/or diagnosis of SARS-CoV-2 by FDA under an Emergency Use Authorization (EUA). This EUA will remain  in effect (meaning this test can be used) for the duration of the COVID-19 declaration under Section 564(b)(1) of the Act, 21 U.S.C.section 360bbb-3(b)(1), unless the authorization is terminated  or revoked sooner.       Influenza A by PCR NEGATIVE NEGATIVE Final   Influenza B by PCR NEGATIVE NEGATIVE Final    Comment: (NOTE) The Xpert Xpress SARS-CoV-2/FLU/RSV plus assay is intended as an aid in the diagnosis of influenza from Nasopharyngeal swab specimens and should not be used as a sole basis for treatment. Nasal washings and aspirates are unacceptable for Xpert Xpress SARS-CoV-2/FLU/RSV testing.  Fact Sheet for  Patients: EntrepreneurPulse.com.au  Fact Sheet for Healthcare Providers: IncredibleEmployment.be  This test is not yet approved or cleared by the [place] FDA and has been authorized for detection and/or diagnosis of SARS-CoV-2 by FDA under an Emergency Use Authorization (EUA). This EUA will remain in effect (meaning this test can be used) for the duration of the COVID-19 declaration under Section 564(b)(1) of the Act, 21 U.S.C. section 360bbb-3(b)(1), unless the authorization is terminated or revoked.  Performed at [facility], [place] [address] [place], [place] [address]   Blood culture (routine x 2)     Status: Abnormal   Collection Time: 02/15/21  4:43 AM   Specimen:  BLOOD RIGHT FOREARM  Result Value Ref Range Status   Specimen Description BLOOD RIGHT FOREARM  Final   Special Requests   Final    BOTTLES DRAWN AEROBIC AND ANAEROBIC Blood Culture results may not be optimal due to an inadequate volume of blood received in culture bottles   Culture  Setup Time   Final    GRAM POSITIVE COCCI IN CHAINS IN CLUSTERS IN BOTH AEROBIC AND ANAEROBIC BOTTLES CRITICAL RESULT CALLED TO, READ BACK BY AND VERIFIED WITH: PHARMD LAURA SEAY BY MESSAN H. AT 0012 ON 02/16/2021 Performed at [facility], [place] [address], [place], [place] [address]    Culture STREPTOCOCCUS CRISTATUS (A)  Final   Report Status 02/17/2021 FINAL  Final   Organism ID, Bacteria STREPTOCOCCUS CRISTATUS  Final      Susceptibility   Streptococcus cristatus - MIC*    PENICILLIN <=0.06 SENSITIVE Sensitive     CEFTRIAXONE <=0.12 SENSITIVE Sensitive     ERYTHROMYCIN <=0.12 SENSITIVE Sensitive     LEVOFLOXACIN 1 SENSITIVE Sensitive     VANCOMYCIN 0.5 SENSITIVE Sensitive     * STREPTOCOCCUS CRISTATUS  Blood Culture ID Panel (Reflexed)     Status: Abnormal   Collection Time: 02/15/21  4:43 AM  Result Value Ref Range Status   Enterococcus faecalis NOT DETECTED NOT DETECTED  Final   Enterococcus Faecium NOT DETECTED NOT DETECTED Final   Listeria monocytogenes NOT DETECTED NOT DETECTED Final   Staphylococcus species NOT DETECTED NOT DETECTED Final   Staphylococcus aureus (BCID) NOT DETECTED NOT DETECTED Final   Staphylococcus epidermidis NOT DETECTED NOT DETECTED Final   Staphylococcus lugdunensis NOT DETECTED NOT DETECTED Final   Streptococcus species DETECTED (A) NOT DETECTED Final    Comment: Not Enterococcus species, Streptococcus agalactiae, Streptococcus pyogenes, or Streptococcus pneumoniae. CRITICAL RESULT CALLED TO, READ BACK BY AND VERIFIED WITH: PHARMD LAURA SEAY BY MESSAN H. AT 0012 ON 02/16/2021    Streptococcus agalactiae NOT DETECTED NOT DETECTED Final   Streptococcus pneumoniae NOT DETECTED NOT DETECTED Final   Streptococcus pyogenes NOT DETECTED NOT DETECTED Final   A.calcoaceticus-baumannii NOT DETECTED NOT DETECTED Final   Bacteroides fragilis NOT DETECTED NOT DETECTED Final   Enterobacterales NOT DETECTED NOT DETECTED Final   Enterobacter cloacae complex NOT DETECTED NOT DETECTED Final   Escherichia coli NOT DETECTED NOT DETECTED Final   Klebsiella aerogenes NOT DETECTED NOT DETECTED Final   Klebsiella oxytoca NOT DETECTED NOT DETECTED Final   Klebsiella pneumoniae NOT DETECTED NOT DETECTED Final   Proteus species NOT DETECTED NOT DETECTED Final   Salmonella species NOT DETECTED NOT DETECTED Final   Serratia marcescens NOT DETECTED NOT DETECTED Final   Haemophilus influenzae NOT DETECTED NOT DETECTED Final   Neisseria meningitidis NOT DETECTED NOT DETECTED Final   Pseudomonas aeruginosa NOT DETECTED NOT DETECTED Final   Stenotrophomonas maltophilia NOT DETECTED NOT DETECTED Final   Candida albicans NOT DETECTED NOT DETECTED Final   Candida auris NOT DETECTED NOT DETECTED Final   Candida glabrata NOT DETECTED NOT DETECTED Final   Candida krusei NOT DETECTED NOT DETECTED Final   Candida parapsilosis NOT DETECTED NOT DETECTED Final    Candida tropicalis NOT DETECTED NOT DETECTED Final   Cryptococcus neoformans/gattii NOT DETECTED NOT DETECTED Final    Comment: Performed at [facility], [address], [place], [place] [address]  Blood culture (routine x 2)     Status: Abnormal   Collection Time: 02/15/21  8:41 AM   Specimen: BLOOD  Result Value Ref Range Status  Specimen Description BLOOD SITE NOT SPECIFIED  Final   Special Requests   Final    BOTTLES DRAWN AEROBIC AND ANAEROBIC Blood Culture results may not be optimal due to an inadequate volume of blood received in culture bottles   Culture  Setup Time   Final    GRAM POSITIVE COCCI IN CHAINS IN CLUSTERS IN BOTH AEROBIC AND ANAEROBIC BOTTLES CRITICAL RESULT CALLED TO, READ BACK BY AND VERIFIED WITH: CRITICAL VALUE NOTED.  VALUE IS CONSISTENT WITH PREVIOUSLY REPORTED AND CALLED VALUE.    Culture (A)  Final    STREPTOCOCCUS CRISTATUS SUSCEPTIBILITIES PERFORMED ON PREVIOUS CULTURE WITHIN THE LAST 5 DAYS. Performed at [facility], [place] [address], [place], [place] [address]    Report Status 02/17/2021 FINAL  Final  Respiratory (~20 pathogens) panel by PCR     Status: None   Collection Time: 02/15/21  8:41 AM   Specimen: Nasopharyngeal Swab; Respiratory  Result Value Ref Range Status   Adenovirus NOT DETECTED NOT DETECTED Final   Coronavirus 229E NOT DETECTED NOT DETECTED Final    Comment: (NOTE) The Coronavirus on the Respiratory Panel, DOES NOT test for the novel  Coronavirus (2019 nCoV)    Coronavirus HKU1 NOT DETECTED NOT DETECTED Final   Coronavirus NL63 NOT DETECTED NOT DETECTED Final   Coronavirus OC43 NOT DETECTED NOT DETECTED Final   Metapneumovirus NOT DETECTED NOT DETECTED Final   Rhinovirus / Enterovirus NOT DETECTED NOT DETECTED Final   Influenza A NOT DETECTED NOT DETECTED Final   Influenza B NOT DETECTED NOT DETECTED Final   Parainfluenza Virus 1 NOT DETECTED NOT DETECTED Final   Parainfluenza Virus 2 NOT DETECTED NOT DETECTED Final    Parainfluenza Virus 3 NOT DETECTED NOT DETECTED Final   Parainfluenza Virus 4 NOT DETECTED NOT DETECTED Final   Respiratory Syncytial Virus NOT DETECTED NOT DETECTED Final   Bordetella pertussis NOT DETECTED NOT DETECTED Final   Bordetella Parapertussis NOT DETECTED NOT DETECTED Final   Chlamydophila pneumoniae NOT DETECTED NOT DETECTED Final   Mycoplasma pneumoniae NOT DETECTED NOT DETECTED Final    Comment: Performed at [facility], [place]. [address], [place], [place] [address]     Janene Madeira, MSN, NP-C [facility] [email or web address] Pager: [phone] Office: [phone] [place]: [phone]

## 2021-02-17 NOTE — Progress Notes (Signed)
PROGRESS NOTE    [name]  [id number] DOB: 1971/05/27 DOA: 02/14/2021 PCP: Patient, No Pcp Per (Inactive)    Brief Narrative:  Anthony Merle Moumouniis a 50 y.o.malewith a history of ESRD (MWF), HTN, hypothyroidism, depression/anxiety and previous TDC infection/bacteremia who presented to the ED 5/15 with fever and some cough found to have pulmonary infiltrates and blood cultures that ultimately grew strep species.   Assessment & Plan:   Principal Problem:   Streptococcal bacteremia Active Problems:   ESRD (end stage renal disease) on dialysis [facility])   Essential hypertension   Chronic viral hepatitis B without delta-agent (HCC)  Sepsis due to Streptococcal bacteremia: AVG doesn't appear infected. Having  - ID following -TTE performed, pending results to r/o endocardiits. If neg, then will likely need f/u TEE - Currently on rocephin. Abx changed to cefazolin for ease of dosing post HD - Repeat blood cultures 5/18 obtained, pending -See below. IR initially consulted for possible aspiration of L renal lesion, however area is not amenable given location. Discussed with ID -Repeat cbc and bmet in AM  Indeterminate left renal lesion:  - see above. Discussed with ID and IR. Area is not amenable to aspiration given location  Atypical pneumonia:  - Antibiotics as above.   ESRD: MWF thru femoral AVG.  - Nephrology is following -Cont HD per nephro  HTN - Continue norvasc, hydralazine   DVT prophylaxis: Heparin subq Code Status: Full Family Communication: Pt in room, family not at bedside  Status is: Inpatient  Remains inpatient appropriate because:Inpatient level of care appropriate due to severity of illness   Dispo: The patient is from: Home              Anticipated d/c is to: Home              Patient currently is not medically stable to d/c.   Difficult to place patient No       Consultants:   ID  IR  Nephrology  Procedures:      Antimicrobials: Anti-infectives (From admission, onward)   Start     Dose/Rate Route Frequency Ordered Stop   02/17/21 1200  ceFAZolin (ANCEF) IVPB 2g/100 mL premix        2 g 200 mL/hr over 30 Minutes Intravenous Every M-W-F (Hemodialysis) 02/17/21 1048     02/16/21 0900  azithromycin (ZITHROMAX) 500 mg in sodium chloride 0.9 % 250 mL IVPB  Status:  Discontinued        500 mg 250 mL/hr over 60 Minutes Intravenous Every 24 hours 02/15/21 1149 02/16/21 1021   02/15/21 0900  vancomycin (VANCOREADY) IVPB 1500 mg/300 mL        1,500 mg 150 mL/hr over 120 Minutes Intravenous  Once 02/15/21 0849 02/15/21 1210   02/15/21 0845  cefTRIAXone (ROCEPHIN) 2 g in sodium chloride 0.9 % 100 mL IVPB  Status:  Discontinued        2 g 200 mL/hr over 30 Minutes Intravenous Every 24 hours 02/15/21 0836 02/17/21 1048   02/15/21 0845  vancomycin (VANCOREADY) IVPB 1000 mg/200 mL  Status:  Discontinued        1,000 mg 200 mL/hr over 60 Minutes Intravenous  Once 02/15/21 0836 02/15/21 0849   02/15/21 0745  azithromycin (ZITHROMAX) tablet 500 mg        500 mg Oral  Once 02/15/21 0733 02/15/21 0858   02/15/21 0000  azithromycin (ZITHROMAX) 250 MG tablet        250 mg Oral Daily 02/15/21  [id number]         Subjective: Without complaints. Eager to go home soon  Objective: Vitals:   02/17/21 1030 02/17/21 1100 02/17/21 1118 02/17/21 1707  BP: (!) 154/94 (!) 168/96 (!) 166/90 (!) 156/95  Pulse: 73 77 77 99  Resp:   (!) 21 18  Temp:   98.2 F (36.8 C) 98.8 F (37.1 C)  TempSrc:   Oral Oral  SpO2:   98% 99%  Weight:   72.8 kg   Height:        Intake/Output Summary (Last 24 hours) at 02/17/2021 1752 Last data filed at 02/17/2021 1700 Gross per 24 hour  Intake 926.17 ml  Output 1800 ml  Net -873.83 ml   Filed Weights   02/16/21 1034 02/17/21 0755 02/17/21 1118  Weight: 73.4 kg 73.7 kg 72.8 kg    Examination: General exam: Awake, laying in bed, in nad Respiratory system: Normal respiratory  effort, no wheezing Cardiovascular system: regular rate, s1, s2 Gastrointestinal system: Soft, nondistended, positive BS Central nervous system: CN2-12 grossly intact, strength intact Extremities: Perfused, no clubbing Skin: Normal skin turgor, no notable skin lesions seen Psychiatry: Mood normal // no visual hallucinations    Data Reviewed: I have personally reviewed following labs and imaging studies  CBC: Recent Labs  Lab 02/14/21 2022 02/16/21 0333 02/17/21 0830  WBC 8.3 6.8 4.7  NEUTROABS 7.0  --   --   HGB 10.0* 9.7* 10.2*  HCT 31.7* 29.6* 31.3*  MCV 94.3 90.0 90.5  PLT 265 252 [id number]   Basic Metabolic Panel: Recent Labs  Lab 02/14/21 2022 02/16/21 0333 02/17/21 0512  NA 138 134* 135  K 4.9 4.6 3.7  CL 95* 95* 97*  CO2 '25 22 26  '$ GLUCOSE 84 102* 94  BUN 59* 85* 44*  CREATININE 13.73* 17.01* 11.66*  CALCIUM 7.3* 6.7* 7.4*  PHOS  --  2.8 2.5   GFR: Estimated Creatinine Clearance: 8 mL/min (A) (by C-G formula based on SCr of 11.66 mg/dL (H)). Liver Function Tests: Recent Labs  Lab 02/14/21 2022 02/17/21 0512  AST 15  --   ALT 16  --   ALKPHOS 94  --   BILITOT 1.4*  --   PROT 6.6  --   ALBUMIN 3.0* 2.4*   No results for input(s): LIPASE, AMYLASE in the last 168 hours. No results for input(s): AMMONIA in the last 168 hours. Coagulation Profile: No results for input(s): INR, PROTIME in the last 168 hours. Cardiac Enzymes: No results for input(s): CKTOTAL, CKMB, CKMBINDEX, TROPONINI in the last 168 hours. BNP (last 3 results) No results for input(s): PROBNP in the last 8760 hours. HbA1C: No results for input(s): HGBA1C in the last 72 hours. CBG: No results for input(s): GLUCAP in the last 168 hours. Lipid Profile: No results for input(s): CHOL, HDL, LDLCALC, TRIG, CHOLHDL, LDLDIRECT in the last 72 hours. Thyroid Function Tests: No results for input(s): TSH, T4TOTAL, FREET4, T3FREE, THYROIDAB in the last 72 hours. Anemia Panel: No results for input(s):  VITAMINB12, FOLATE, FERRITIN, TIBC, IRON, RETICCTPCT in the last 72 hours. Sepsis Labs: Recent Labs  Lab 02/14/21 2045 02/15/21 0443 02/15/21 1329  PROCALCITON  --   --  8.64  LATICACIDVEN 1.2 1.5  --     Recent Results (from the past 240 hour(s))  Resp Panel by RT-PCR (Flu A&B, Covid) Nasopharyngeal Swab     Status: None   Collection Time: 02/09/21  5:15 AM   Specimen: Nasopharyngeal Swab; Nasopharyngeal(NP) swabs in vial transport  medium  Result Value Ref Range Status   SARS Coronavirus 2 by RT PCR NEGATIVE NEGATIVE Final    Comment: (NOTE) SARS-CoV-2 target nucleic acids are NOT DETECTED.  The SARS-CoV-2 RNA is generally detectable in upper respiratory specimens during the acute phase of infection. The lowest concentration of SARS-CoV-2 viral copies this assay can detect is 138 copies/mL. A negative result does not preclude SARS-Cov-2 infection and should not be used as the sole basis for treatment or other patient management decisions. A negative result may occur with  improper specimen collection/handling, submission of specimen other than nasopharyngeal swab, presence of viral mutation(s) within the areas targeted by this assay, and inadequate number of viral copies(<138 copies/mL). A negative result must be combined with clinical observations, patient history, and epidemiological information. The expected result is Negative.  Fact Sheet for Patients:  EntrepreneurPulse.com.au  Fact Sheet for Healthcare Providers:  IncredibleEmployment.be  This test is no t yet approved or cleared by the [place] FDA and  has been authorized for detection and/or diagnosis of SARS-CoV-2 by FDA under an Emergency Use Authorization (EUA). This EUA will remain  in effect (meaning this test can be used) for the duration of the COVID-19 declaration under Section 564(b)(1) of the Act, 21 U.S.C.section 360bbb-3(b)(1), unless the authorization is  terminated  or revoked sooner.       Influenza A by PCR NEGATIVE NEGATIVE Final   Influenza B by PCR NEGATIVE NEGATIVE Final    Comment: (NOTE) The Xpert Xpress SARS-CoV-2/FLU/RSV plus assay is intended as an aid in the diagnosis of influenza from Nasopharyngeal swab specimens and should not be used as a sole basis for treatment. Nasal washings and aspirates are unacceptable for Xpert Xpress SARS-CoV-2/FLU/RSV testing.  Fact Sheet for Patients: EntrepreneurPulse.com.au  Fact Sheet for Healthcare Providers: IncredibleEmployment.be  This test is not yet approved or cleared by the [place] FDA and has been authorized for detection and/or diagnosis of SARS-CoV-2 by FDA under an Emergency Use Authorization (EUA). This EUA will remain in effect (meaning this test can be used) for the duration of the COVID-19 declaration under Section 564(b)(1) of the Act, 21 U.S.C. section 360bbb-3(b)(1), unless the authorization is terminated or revoked.  Performed at [facility], [place] [address] [place], [place] [address]   Resp Panel by RT-PCR (Flu A&B, Covid) Nasopharyngeal Swab     Status: None   Collection Time: 02/14/21  8:10 PM   Specimen: Nasopharyngeal Swab; Nasopharyngeal(NP) swabs in vial transport medium  Result Value Ref Range Status   SARS Coronavirus 2 by RT PCR NEGATIVE NEGATIVE Final    Comment: (NOTE) SARS-CoV-2 target nucleic acids are NOT DETECTED.  The SARS-CoV-2 RNA is generally detectable in upper respiratory specimens during the acute phase of infection. The lowest concentration of SARS-CoV-2 viral copies this assay can detect is 138 copies/mL. A negative result does not preclude SARS-Cov-2 infection and should not be used as the sole basis for treatment or other patient management decisions. A negative result may occur with  improper specimen collection/handling, submission of specimen other than nasopharyngeal swab,  presence of viral mutation(s) within the areas targeted by this assay, and inadequate number of viral copies(<138 copies/mL). A negative result must be combined with clinical observations, patient history, and epidemiological information. The expected result is Negative.  Fact Sheet for Patients:  EntrepreneurPulse.com.au  Fact Sheet for Healthcare Providers:  IncredibleEmployment.be  This test is no t yet approved or cleared by the [place] FDA and  has been  authorized for detection and/or diagnosis of SARS-CoV-2 by FDA under an Emergency Use Authorization (EUA). This EUA will remain  in effect (meaning this test can be used) for the duration of the COVID-19 declaration under Section 564(b)(1) of the Act, 21 U.S.C.section 360bbb-3(b)(1), unless the authorization is terminated  or revoked sooner.       Influenza A by PCR NEGATIVE NEGATIVE Final   Influenza B by PCR NEGATIVE NEGATIVE Final    Comment: (NOTE) The Xpert Xpress SARS-CoV-2/FLU/RSV plus assay is intended as an aid in the diagnosis of influenza from Nasopharyngeal swab specimens and should not be used as a sole basis for treatment. Nasal washings and aspirates are unacceptable for Xpert Xpress SARS-CoV-2/FLU/RSV testing.  Fact Sheet for Patients: EntrepreneurPulse.com.au  Fact Sheet for Healthcare Providers: IncredibleEmployment.be  This test is not yet approved or cleared by the [place] FDA and has been authorized for detection and/or diagnosis of SARS-CoV-2 by FDA under an Emergency Use Authorization (EUA). This EUA will remain in effect (meaning this test can be used) for the duration of the COVID-19 declaration under Section 564(b)(1) of the Act, 21 U.S.C. section 360bbb-3(b)(1), unless the authorization is terminated or revoked.  Performed at [facility], [place] [address], [place], [place] [address]   Blood culture  (routine x 2)     Status: Abnormal   Collection Time: 02/15/21  4:43 AM   Specimen: BLOOD RIGHT FOREARM  Result Value Ref Range Status   Specimen Description BLOOD RIGHT FOREARM  Final   Special Requests   Final    BOTTLES DRAWN AEROBIC AND ANAEROBIC Blood Culture results may not be optimal due to an inadequate volume of blood received in culture bottles   Culture  Setup Time   Final    GRAM POSITIVE COCCI IN CHAINS IN CLUSTERS IN BOTH AEROBIC AND ANAEROBIC BOTTLES CRITICAL RESULT CALLED TO, READ BACK BY AND VERIFIED WITH: PHARMD LAURA SEAY BY MESSAN H. AT 0012 ON 02/16/2021 Performed at [facility], [place] [address], [place], [place] [address]    Culture STREPTOCOCCUS CRISTATUS (A)  Final   Report Status 02/17/2021 FINAL  Final   Organism ID, Bacteria STREPTOCOCCUS CRISTATUS  Final      Susceptibility   Streptococcus cristatus - MIC*    PENICILLIN <=0.06 SENSITIVE Sensitive     CEFTRIAXONE <=0.12 SENSITIVE Sensitive     ERYTHROMYCIN <=0.12 SENSITIVE Sensitive     LEVOFLOXACIN 1 SENSITIVE Sensitive     VANCOMYCIN 0.5 SENSITIVE Sensitive     * STREPTOCOCCUS CRISTATUS  Blood Culture ID Panel (Reflexed)     Status: Abnormal   Collection Time: 02/15/21  4:43 AM  Result Value Ref Range Status   Enterococcus faecalis NOT DETECTED NOT DETECTED Final   Enterococcus Faecium NOT DETECTED NOT DETECTED Final   Listeria monocytogenes NOT DETECTED NOT DETECTED Final   Staphylococcus species NOT DETECTED NOT DETECTED Final   Staphylococcus aureus (BCID) NOT DETECTED NOT DETECTED Final   Staphylococcus epidermidis NOT DETECTED NOT DETECTED Final   Staphylococcus lugdunensis NOT DETECTED NOT DETECTED Final   Streptococcus species DETECTED (A) NOT DETECTED Final    Comment: Not Enterococcus species, Streptococcus agalactiae, Streptococcus pyogenes, or Streptococcus pneumoniae. CRITICAL RESULT CALLED TO, READ BACK BY AND VERIFIED WITH: PHARMD LAURA SEAY BY MESSAN H. AT 0012 ON 02/16/2021     Streptococcus agalactiae NOT DETECTED NOT DETECTED Final   Streptococcus pneumoniae NOT DETECTED NOT DETECTED Final   Streptococcus pyogenes NOT DETECTED NOT DETECTED Final   A.calcoaceticus-baumannii  NOT DETECTED NOT DETECTED Final   Bacteroides fragilis NOT DETECTED NOT DETECTED Final   Enterobacterales NOT DETECTED NOT DETECTED Final   Enterobacter cloacae complex NOT DETECTED NOT DETECTED Final   Escherichia coli NOT DETECTED NOT DETECTED Final   Klebsiella aerogenes NOT DETECTED NOT DETECTED Final   Klebsiella oxytoca NOT DETECTED NOT DETECTED Final   Klebsiella pneumoniae NOT DETECTED NOT DETECTED Final   Proteus species NOT DETECTED NOT DETECTED Final   Salmonella species NOT DETECTED NOT DETECTED Final   Serratia marcescens NOT DETECTED NOT DETECTED Final   Haemophilus influenzae NOT DETECTED NOT DETECTED Final   Neisseria meningitidis NOT DETECTED NOT DETECTED Final   Pseudomonas aeruginosa NOT DETECTED NOT DETECTED Final   Stenotrophomonas maltophilia NOT DETECTED NOT DETECTED Final   Candida albicans NOT DETECTED NOT DETECTED Final   Candida auris NOT DETECTED NOT DETECTED Final   Candida glabrata NOT DETECTED NOT DETECTED Final   Candida krusei NOT DETECTED NOT DETECTED Final   Candida parapsilosis NOT DETECTED NOT DETECTED Final   Candida tropicalis NOT DETECTED NOT DETECTED Final   Cryptococcus neoformans/gattii NOT DETECTED NOT DETECTED Final    Comment: Performed at [facility], [place] [address] [place], [place] [address]  Blood culture (routine x 2)     Status: Abnormal   Collection Time: 02/15/21  8:41 AM   Specimen: BLOOD  Result Value Ref Range Status   Specimen Description BLOOD SITE NOT SPECIFIED  Final   Special Requests   Final    BOTTLES DRAWN AEROBIC AND ANAEROBIC Blood Culture results may not be optimal due to an inadequate volume of blood received in culture bottles   Culture  Setup Time   Final    GRAM POSITIVE COCCI IN CHAINS IN CLUSTERS IN BOTH  AEROBIC AND ANAEROBIC BOTTLES CRITICAL RESULT CALLED TO, READ BACK BY AND VERIFIED WITH: CRITICAL VALUE NOTED.  VALUE IS CONSISTENT WITH PREVIOUSLY REPORTED AND CALLED VALUE.    Culture (A)  Final    STREPTOCOCCUS CRISTATUS SUSCEPTIBILITIES PERFORMED ON PREVIOUS CULTURE WITHIN THE LAST 5 DAYS. Performed at [facility], [place] [address] [place], [place] [address]    Report Status 02/17/2021 FINAL  Final  Respiratory (~20 pathogens) panel by PCR     Status: None   Collection Time: 02/15/21  8:41 AM   Specimen: Nasopharyngeal Swab; Respiratory  Result Value Ref Range Status   Adenovirus NOT DETECTED NOT DETECTED Final   Coronavirus 229E NOT DETECTED NOT DETECTED Final    Comment: (NOTE) The Coronavirus on the Respiratory Panel, DOES NOT test for the novel  Coronavirus (2019 nCoV)    Coronavirus HKU1 NOT DETECTED NOT DETECTED Final   Coronavirus NL63 NOT DETECTED NOT DETECTED Final   Coronavirus OC43 NOT DETECTED NOT DETECTED Final   Metapneumovirus NOT DETECTED NOT DETECTED Final   Rhinovirus / Enterovirus NOT DETECTED NOT DETECTED Final   Influenza A NOT DETECTED NOT DETECTED Final   Influenza B NOT DETECTED NOT DETECTED Final   Parainfluenza Virus 1 NOT DETECTED NOT DETECTED Final   Parainfluenza Virus 2 NOT DETECTED NOT DETECTED Final   Parainfluenza Virus 3 NOT DETECTED NOT DETECTED Final   Parainfluenza Virus 4 NOT DETECTED NOT DETECTED Final   Respiratory Syncytial Virus NOT DETECTED NOT DETECTED Final   Bordetella pertussis NOT DETECTED NOT DETECTED Final   Bordetella Parapertussis NOT DETECTED NOT DETECTED Final   Chlamydophila pneumoniae NOT DETECTED NOT DETECTED Final   Mycoplasma pneumoniae NOT DETECTED NOT DETECTED Final  Comment: Performed at [facility], [place] [address], [place], [place] [address]     Radiology Studies: No results found.  Scheduled Meds: . amLODipine  10 mg Oral Daily  . benzonatate  100 mg Oral Q8H  . calcitRIOL  0.25 mcg Oral Q  M,W,F-HD  . calcium carbonate  2 tablet Oral TID WC  . calcium carbonate  2 tablet Oral QHS  . Chlorhexidine Gluconate Cloth  6 each Topical Q0600  . Chlorhexidine Gluconate Cloth  6 each Topical Q0600  . darbepoetin (ARANESP) injection - DIALYSIS  60 mcg Intravenous Q Wed-HD  . heparin  5,000 Units Subcutaneous Q8H  . hydrALAZINE  50 mg Oral TID  . multivitamin  1 tablet Oral QHS  . sodium chloride flush  3 mL Intravenous Q12H   Continuous Infusions: .  ceFAZolin (ANCEF) IV 2 g (02/17/21 1217)     LOS: 2 days   Marylu Lund, MD [facility] Pager On Amion  If 7PM-7AM, please contact night-coverage 02/17/2021, 5:52 PM

## 2021-02-17 NOTE — Progress Notes (Addendum)
[facility] Progress Note   Subjective:   Patient seen and examined at bedside in dialysis.  No specific complaints today.  Denies abdominal pain, n/v/d, weakness, dizziness, SOB, edema and orthopnea.  Admits to chills last night. Tmax 100.3 yesterday.   Objective Vitals:   02/17/21 0900 02/17/21 0930 02/17/21 1000 02/17/21 1030  BP: 140/89 (!) 169/101 (!) 155/94 (!) 154/94  Pulse: 77 85 82 73  Resp:      Temp:      TempSrc:      SpO2:      Weight:      Height:       Physical Exam General:well appearing male in NAD Heart:RRR, no mrg Lungs:CTAB, nml WOB Abdomen:soft, NTND Extremities:no LE edema Dialysis Access: R thigh AVG - no erythema, edema or drainage   Filed Weights   02/16/21 0725 02/16/21 1034 02/17/21 0755  Weight: 76.6 kg 73.4 kg 73.7 kg    Intake/Output Summary (Last 24 hours) at 02/17/2021 1101 Last data filed at 02/17/2021 0500 Gross per 24 hour  Intake 821.75 ml  Output --  Net 821.75 ml    Additional Objective Labs: Basic Metabolic Panel: Recent Labs  Lab 02/14/21 2022 02/16/21 0333 02/17/21 0512  NA 138 134* 135  K 4.9 4.6 3.7  CL 95* 95* 97*  CO2 '25 22 26  '$ GLUCOSE 84 102* 94  BUN 59* 85* 44*  CREATININE 13.73* 17.01* 11.66*  CALCIUM 7.3* 6.7* 7.4*  PHOS  --  2.8 2.5   Liver Function Tests: Recent Labs  Lab 02/14/21 2022 02/17/21 0512  AST 15  --   ALT 16  --   ALKPHOS 94  --   BILITOT 1.4*  --   PROT 6.6  --   ALBUMIN 3.0* 2.4*   CBC: Recent Labs  Lab 02/14/21 2022 02/16/21 0333 02/17/21 0830  WBC 8.3 6.8 4.7  NEUTROABS 7.0  --   --   HGB 10.0* 9.7* 10.2*  HCT 31.7* 29.6* 31.3*  MCV 94.3 90.0 90.5  PLT 265 252 303   Blood Culture    Component Value Date/Time   SDES BLOOD SITE NOT SPECIFIED 02/15/2021 0841   SPECREQUEST  02/15/2021 0841    BOTTLES DRAWN AEROBIC AND ANAEROBIC Blood Culture results may not be optimal due to an inadequate volume of blood received in culture bottles   CULT (A) 02/15/2021  0841    STREPTOCOCCUS CRISTATUS SUSCEPTIBILITIES PERFORMED ON PREVIOUS CULTURE WITHIN THE LAST 5 DAYS. Performed at [facility], [place] [address] [place], [place] [address]    REPTSTATUS 02/17/2021 FINAL 02/15/2021 0841    Lab Results  Component Value Date   INR 0.9 12/10/2019   INR 0.9 09/17/2019   INR 1.0 02/06/2019    Medications: . sodium chloride    . sodium chloride    .  ceFAZolin (ANCEF) IV     . amLODipine  10 mg Oral Daily  . benzonatate  100 mg Oral Q8H  . calcitRIOL  0.25 mcg Oral Q M,W,F-HD  . calcium carbonate  2 tablet Oral TID WC  . calcium carbonate  2 tablet Oral QHS  . Chlorhexidine Gluconate Cloth  6 each Topical Q0600  . Chlorhexidine Gluconate Cloth  6 each Topical Q0600  . darbepoetin (ARANESP) injection - DIALYSIS  60 mcg Intravenous Q Wed-HD  . heparin  5,000 Units Subcutaneous Q8H  . hydrALAZINE  50 mg Oral TID  . multivitamin  1 tablet Oral QHS  . sodium chloride flush  3 mL Intravenous Q12H    Dialysis Orders: MWF -NW 3.75hrs, BFR450, DFR500, EDW 73kg,2K/2.5CaP4  Access:R thigh AVG Heparin1500 unit bolus qHD Mircera30mg q2wks - last 1025m on 02/03/21 Venofer '50mg'$  qHD Calcitriol0.2564mPO qHD  Assessment/Plan: 1. Hypoxia- improved. CTwith opacities likely atypical PNA but also trace pulmonary edema, will challenge dry weight while admitted. Resp panel negative. Started on ABX. BC pending. Per primary. 2. Bacteremia - +BC for streptococcus cristatus. Etiology unclear.  CT abdomen with indistinct L renal lesion suspicious for infection vs intracystic hemorrhage.  No abdominal pain or symptoms. Unable to aspirate due to location.  ECHO ordered to r/o endocarditis.  BC repeated. AVG does not appear infected. On ceftriaxone. ID following.  3. ESRD- On HD MWF. HD today per regular schedule.  4. Hypertension/volume- Blood pressure elevated. Continue home meds.  Likely weight loss with recent decreased appetite, CT  with trace pulmonary edema.  Challenge dry weight weight while admitted, lower on d/c. 5. Anemiaof CKD- Hgb 10.2. Aranesp 66m7mwk ordered with HD today. 6. Secondary Hyperparathyroidism -S/p parathyroidectomy. CCa close to goal. Phos at goal.Binders reduced to 2 TUMS AC TID last week.  7. Nutrition- Renal diet w/fluid restrictions.  8. Hep B 9. Polycystic kidneys w/indistinct left renal cyst/lesion- ?recent intracystic hemorrhagevsinfection. On ABX. No abdominal symptoms. IR unable to aspirate due to location.    LindJen Mow-C [facility] 02/17/2021,11:01 AM  LOS: 2 days

## 2021-02-17 NOTE — Progress Notes (Signed)
*  PRELIMINARY RESULTS* Echocardiogram 2D Echocardiogram has been performed.  Luisa Hart RDCS 02/17/2021, 3:28 PM

## 2021-02-18 LAB — HEPATITIS B DNA, ULTRAQUANTITATIVE, PCR
HBV DNA SERPL PCR-ACNC: 900 IU/mL
HBV DNA SERPL PCR-LOG IU: 2.954 log10 IU/mL

## 2021-02-18 LAB — HEPATITIS B E ANTIGEN: Hep B E Ag: NEGATIVE

## 2021-02-18 MED ORDER — CHLORHEXIDINE GLUCONATE CLOTH 2 % EX PADS
6.0000 | MEDICATED_PAD | Freq: Every day | CUTANEOUS | Status: DC
  Administered 2021-02-19: 6 via TOPICAL

## 2021-02-18 NOTE — Progress Notes (Signed)
    [facility] has been requested to perform a transesophageal echocardiogram on 02/18/2021 for bacteremia.  After careful review of history and examination, the risks and benefits of transesophageal echocardiogram have been explained including risks of esophageal damage, perforation (1:10,000 risk), bleeding, pharyngeal hematoma as well as other potential complications associated with conscious sedation including aspiration, arrhythmia, respiratory failure and death. Alternatives to treatment were discussed, questions were answered. Patient is willing to proceed.   50 yo male with PMH of ESRD on HD presented with fever and bacteremia. Hemoglobin 10.2, platelet normal. Vital sign normal.   Almyra Deforest, PA-C 02/18/2021 4:07 PM  .

## 2021-02-18 NOTE — Progress Notes (Signed)
[facility] Progress Note   Subjective:  Patient seen and examined at bedside.  No new complaints.  Denies CP, SOB, n/v/d, abdominal pain, weakness and fatigue.    Objective Vitals:   02/18/21 0002 02/18/21 0345 02/18/21 0805 02/18/21 1226  BP: (!) 157/88 (!) 150/84 (!) 146/80 (!) 150/82  Pulse: 96 83 86 84  Resp: '15 20 14 20  '$ Temp: 98.3 F (36.8 C) 99.5 F (37.5 C) 98.5 F (36.9 C)   TempSrc: Oral Oral Oral   SpO2: 100% 94% 98% 96%  Weight:      Height:       Physical Exam General:well appearing male in NAD Heart:RRR, no mrg Lungs:CTAB, nml WOB Abdomen:soft, NTND Extremities:no LE edema Dialysis Access: R thigh AVG +b/t   Filed Weights   02/16/21 1034 02/17/21 0755 02/17/21 1118  Weight: 73.4 kg 73.7 kg 72.8 kg    Intake/Output Summary (Last 24 hours) at 02/18/2021 1238 Last data filed at 02/17/2021 1700 Gross per 24 hour  Intake 104.42 ml  Output --  Net 104.42 ml    Additional Objective Labs: Basic Metabolic Panel: Recent Labs  Lab 02/14/21 2022 02/16/21 0333 02/17/21 0512  NA 138 134* 135  K 4.9 4.6 3.7  CL 95* 95* 97*  CO2 '25 22 26  '$ GLUCOSE 84 102* 94  BUN 59* 85* 44*  CREATININE 13.73* 17.01* 11.66*  CALCIUM 7.3* 6.7* 7.4*  PHOS  --  2.8 2.5   Liver Function Tests: Recent Labs  Lab 02/14/21 2022 02/17/21 0512  AST 15  --   ALT 16  --   ALKPHOS 94  --   BILITOT 1.4*  --   PROT 6.6  --   ALBUMIN 3.0* 2.4*   CBC: Recent Labs  Lab 02/14/21 2022 02/16/21 0333 02/17/21 0830  WBC 8.3 6.8 4.7  NEUTROABS 7.0  --   --   HGB 10.0* 9.7* 10.2*  HCT 31.7* 29.6* 31.3*  MCV 94.3 90.0 90.5  PLT 265 252 303   Blood Culture    Component Value Date/Time   SDES BLOOD RIGHT ANTECUBITAL 02/17/2021 0512   SDES BLOOD RIGHT HAND 02/17/2021 0512   SPECREQUEST  02/17/2021 0512    BOTTLES DRAWN AEROBIC AND ANAEROBIC Blood Culture results may not be optimal due to an excessive volume of blood received in culture bottles   SPECREQUEST   02/17/2021 0512    BOTTLES DRAWN AEROBIC AND ANAEROBIC Blood Culture adequate volume   CULT  02/17/2021 0512    NO GROWTH 1 DAY Performed at [facility] [address], [place], [place] [address]    CULT  02/17/2021 0512    NO GROWTH 1 DAY Performed at [facility], [place] [address], [place], [place] [address]    REPTSTATUS PENDING 02/17/2021 [id number]   REPTSTATUS PENDING 02/17/2021 [id number]   Studies/Results: DG Orthopantogram  Result Date: 02/17/2021 CLINICAL DATA:  Bacteremia and fever EXAM: ORTHOPANTOGRAM/PANORAMIC COMPARISON:  None. FINDINGS: Panoramic view of the mandible was obtained and reveals no evidence of acute fracture. Slight decreased attenuation is noted in the left second mandibular molar consistent with dental caries. A few other scattered caries are noted. Erosive changes of the third maxillary molar on the left are seen likely related to extensive dental caries. No periapical lucencies are seen. IMPRESSION: Scattered dental caries worst in the left third maxillary molar. No periapical abscess is noted. Electronically Signed   By: Inez Catalina M.D.   On: 02/17/2021 19:20   ECHOCARDIOGRAM COMPLETE  Result Date: 02/17/2021    ECHOCARDIOGRAM REPORT   Patient Name:   [name] Date of Exam: 02/17/2021 Medical Rec #:  [id number]             Height:       72.0 in Accession #:    [id number]            Weight:       160.5 lb Date of Birth:  1971-01-24             BSA:          1.940 m Patient Age:    50 years              BP:           166/90 mmHg Patient Gender: M                     HR:           92 bpm. Exam Location:  Inpatient Procedure: 2D Echo, Cardiac Doppler and Color Doppler Indications:    Bacteremia  History:        Patient has no prior history of Echocardiogram examinations.                 Risk Factors:Hypertension.  Sonographer:    Luisa Hart RDCS Referring Phys: [place]  1. The aortic valve is tricuspid. There is mild  thickening of the aortic valve. Aortic valve regurgitation is mild to moderate, but in the PLAX and PSAC views, there appear to be both a central and a commissural jets.  2. Left ventricular ejection fraction, by estimation, is 55 to 60%. The left ventricle has normal function. The left ventricle has no regional wall motion abnormalities. The left ventricular internal cavity size was mildly dilated. There is moderate concentric left ventricular hypertrophy. Left ventricular diastolic parameters are consistent with Grade II diastolic dysfunction (pseudonormalization).  3. Right ventricular systolic function is normal. The right ventricular size is mildly enlarged. There is moderately elevated pulmonary artery systolic pressure.  4. Left atrial size was mildly dilated.  5. The mitral valve is abnormal. Mild to moderate mitral valve regurgitation. The mean mitral valve gradient is 5.0 mmHg.  6. Tricuspid valve regurgitation is moderate to severe.  7. Aortic dilatation noted. There is mild dilatation of the ascending aorta, measuring 40 mm. Comparison(s): No prior Echocardiogram. Conclusion(s)/Recommendation(s): Though no vegetation, TEE would be reasonable to consider evaluation of aortic valve, if clinically indicated. FINDINGS  Left Ventricle: Left ventricular ejection fraction, by estimation, is 55 to 60%. The left ventricle has normal function. The left ventricle has no regional wall motion abnormalities. The left ventricular internal cavity size was mildly dilated. There is  moderate concentric left ventricular hypertrophy. Left ventricular diastolic parameters are consistent with Grade II diastolic dysfunction (pseudonormalization). Right Ventricle: The right ventricular size is mildly enlarged. No increase in right ventricular wall thickness. Right ventricular systolic function is normal. There is moderately elevated pulmonary artery systolic pressure. The tricuspid regurgitant velocity is 3.31 m/s, and with an  assumed right atrial pressure of 3 mmHg, the estimated right ventricular systolic pressure is [id number]. Left Atrium: Left atrial size was mildly dilated. Right Atrium: Right atrial size was normal in size. Pericardium: There is no evidence of pericardial effusion. Mitral Valve: The mitral valve is abnormal. There is mild thickening of the mitral valve leaflet(s). Mild to moderate mitral valve regurgitation. MV peak gradient, 10.8 mmHg. The  mean mitral valve gradient is 5.0 mmHg with average heart rate of 91 bpm. Tricuspid Valve: The tricuspid valve is normal in structure. Tricuspid valve regurgitation is moderate to severe. No evidence of tricuspid stenosis. Aortic Valve: The aortic valve is tricuspid. There is mild thickening of the aortic valve. Aortic valve regurgitation is mild. Aortic regurgitation PHT measures 351 msec. Aortic valve mean gradient measures 8.5 mmHg. Aortic valve peak gradient measures 15.2 mmHg. Aortic valve area, by VTI measures 3.86 cm. Pulmonic Valve: The pulmonic valve was normal in structure. Pulmonic valve regurgitation is not visualized. No evidence of pulmonic stenosis. Aorta: Aortic dilatation noted. There is mild dilatation of the ascending aorta, measuring 40 mm. IAS/Shunts: The atrial septum is grossly normal.  LEFT VENTRICLE PLAX 2D LVIDd:         6.00 cm      Diastology LVIDs:         4.70 cm      LV e' medial:    6.85 cm/s LV PW:         1.40 cm      LV E/e' medial:  17.5 LV IVS:        1.30 cm      LV e' lateral:   9.90 cm/s LVOT diam:     2.40 cm      LV E/e' lateral: 12.1 LV SV:         121 LV SV Index:   62 LVOT Area:     4.52 cm  LV Volumes (MOD) LV vol d, MOD A2C: 182.0 ml LV vol d, MOD A4C: 156.0 ml LV vol s, MOD A2C: 85.2 ml LV vol s, MOD A4C: 93.2 ml LV SV MOD A2C:     96.8 ml LV SV MOD A4C:     156.0 ml LV SV MOD BP:      80.9 ml RIGHT VENTRICLE RV Basal diam:  5.10 cm RV Mid diam:    3.50 cm RV S prime:     15.10 cm/s TAPSE (M-mode): 2.9 cm LEFT ATRIUM              Index       RIGHT ATRIUM           Index LA diam:        3.70 cm 1.91 cm/m  RA Area:     16.70 cm LA Vol (A2C):   64.3 ml 33.14 ml/m RA Volume:   43.00 ml  22.16 ml/m LA Vol (A4C):   59.6 ml 30.72 ml/m LA Biplane Vol: 66.1 ml 34.07 ml/m  AORTIC VALVE                    PULMONIC VALVE AV Area (Vmax):    4.04 cm     PV Vmax:       1.42 m/s AV Area (Vmean):   3.94 cm     PV Vmean:      106.000 cm/s AV Area (VTI):     3.86 cm     PV VTI:        0.270 m AV Vmax:           195.00 cm/s  PV Peak grad:  8.1 mmHg AV Vmean:          135.500 cm/s PV Mean grad:  5.0 mmHg AV VTI:            0.314 m AV Peak Grad:      15.2 mmHg AV Mean Grad:  8.5 mmHg LVOT Vmax:         174.00 cm/s LVOT Vmean:        118.000 cm/s LVOT VTI:          0.268 m LVOT/AV VTI ratio: 0.85 AI PHT:            351 msec  AORTA Ao Root diam: 3.50 cm Ao Asc diam:  3.75 cm MITRAL VALVE                 TRICUSPID VALVE MV Area (PHT): 5.23 cm      TR Peak grad:   43.8 mmHg MV Area VTI:   3.28 cm      TR Vmax:        331.00 cm/s MV Peak grad:  10.8 mmHg MV Mean grad:  5.0 mmHg      SHUNTS MV Vmax:       1.64 m/s      Systemic VTI:  0.27 m MV Vmean:      105.0 cm/s    Systemic Diam: 2.40 cm MV Decel Time: 145 msec MR Peak grad:    114.9 mmHg MR Mean grad:    77.0 mmHg MR Vmax:         536.00 cm/s MR Vmean:        424.0 cm/s MR PISA:         1.01 cm MR PISA Eff ROA: 6 mm MR PISA Radius:  0.40 cm MV E velocity: 120.00 cm/s MV A velocity: 88.90 cm/s MV E/A ratio:  1.35 Rudean Haskell MD Electronically signed by Rudean Haskell MD Signature Date/Time: 02/17/2021/7:10:14 PM    Final     Medications: .  ceFAZolin (ANCEF) IV 2 g (02/17/21 1217)   . amLODipine  10 mg Oral Daily  . benzonatate  100 mg Oral Q8H  . calcitRIOL  0.25 mcg Oral Q M,W,F-HD  . calcium carbonate  2 tablet Oral TID WC  . calcium carbonate  2 tablet Oral QHS  . Chlorhexidine Gluconate Cloth  6 each Topical Q0600  . Chlorhexidine Gluconate Cloth  6 each Topical Q0600   . darbepoetin (ARANESP) injection - DIALYSIS  60 mcg Intravenous Q Wed-HD  . heparin  5,000 Units Subcutaneous Q8H  . hydrALAZINE  50 mg Oral TID  . multivitamin  1 tablet Oral QHS  . sodium chloride flush  3 mL Intravenous Q12H    Dialysis Orders: MWF -NW 3.75hrs, BFR450, DFR500, EDW 73kg,2K/2.5CaP4  Access:R thigh AVG Heparin1500 unit bolus qHD Mircera68mg q2wks - last 1062m on 02/03/21 Venofer '50mg'$  qHD Calcitriol0.2513mPO qHD  Assessment/Plan: 1. Hypoxia-improved.CTwith opacities likely atypical PNAbut also trace pulmonary edema, will challenge dry weight while admitted.Resp panel negative. Started on ABX. BC pending. Per primary. 2. Bacteremia - +BC for streptococcus cristatus. Etiology unclear. CT abdomen with indistinct L renal lesion suspicious for infection vs intracystic hemorrhage. No abdominal pain or symptoms. Unable to aspirate due to location. AVG does not appear infected. On cefazolin.   Multiple dental caries on on orthopantogram. Repeat BC negative.  TTE inconclusive, plan for TEE, ID following.  3. ESRD- On HD MWF.HD tomorrow per regular schedule.  4. Hypertension/volume- Blood pressure remains elevated, increase hydralazine to '75mg'$  TID, continue amlodipine '10mg'$  qd. Likelyweight loss with recent decreased appetite, CT with trace pulmonary edema. Challenge dry weight weight while admitted, lower on d/c. 5. Anemiaof CKD- Hgb10.2.Aranesp 78m19mwk ordered with HD today. 6. Secondary Hyperparathyroidism -S/p parathyroidectomy. CCa close to goal. Phos at goal.Binders reduced to  2 TUMS AC TID last week.  7. Nutrition- Renal diet w/fluid restrictions.  8. Hep B 9. Polycystic kidneys w/indistinct left renal cyst/lesion- ?recent intracystic hemorrhagevsinfection. On ABX. No abdominal symptoms.IR unable to aspirate due to location.     Jen Mow, PA-C [facility] 02/18/2021,12:38 PM  LOS: 3 days

## 2021-02-18 NOTE — Progress Notes (Signed)
PROGRESS NOTE    [name]  [name] DOB: 18-Apr-1971 DOA: 02/14/2021 PCP: Patient, No Pcp Per (Inactive)    Brief Narrative:  Anthony Merle Moumouniis a 50 y.o.malewith a history of ESRD (MWF), HTN, hypothyroidism, depression/anxiety and previous TDC infection/bacteremia who presented to the ED 5/15 with fever and some cough found to have pulmonary infiltrates and blood cultures that ultimately grew strep species.   Assessment & Plan:   Principal Problem:   Streptococcal bacteremia Active Problems:   ESRD (end stage renal disease) on dialysis [facility])   Essential hypertension   Chronic viral hepatitis B without delta-agent (HCC)  Sepsis due to Streptococcal bacteremia: AVG doesn't appear infected. Having   - ID continues to follow -TTE reviewed, no evidence of vegetation on that study -TEE recommended by ID, pending - Currently continued on cefazolin - Repeat blood cultures 5/18 obtained, neg thus far -See below. IR initially consulted for possible aspiration of L renal lesion, however area is not amenable given location. Discussed with ID -Repeat cbc and bmet in AM  Indeterminate left renal lesion:  - see above. Discussed with ID and IR. Area is not amenable to aspiration given location -stable for now  Atypical pneumonia:  - Continue with antibiotics as above.   ESRD: MWF thru femoral AVG.  - Nephrology is following -Cont HD per nephro  HTN - Continue norvasc, hydralazine as tolerated   DVT prophylaxis: Heparin subq Code Status: Full Family Communication: Pt in room, family not at bedside  Status is: Inpatient  Remains inpatient appropriate because:Inpatient level of care appropriate due to severity of illness   Dispo: The patient is from: Home              Anticipated d/c is to: Home              Patient currently is not medically stable to d/c.   Difficult to place patient No   Consultants:   ID  IR  Nephrology  Procedures:      Antimicrobials: Anti-infectives (From admission, onward)   Start     Dose/Rate Route Frequency Ordered Stop   02/17/21 1200  ceFAZolin (ANCEF) IVPB 2g/100 mL premix        2 g 200 mL/hr over 30 Minutes Intravenous Every M-W-F (Hemodialysis) 02/17/21 1048     02/16/21 0900  azithromycin (ZITHROMAX) 500 mg in sodium chloride 0.9 % 250 mL IVPB  Status:  Discontinued        500 mg 250 mL/hr over 60 Minutes Intravenous Every 24 hours 02/15/21 1149 02/16/21 1021   02/15/21 0900  vancomycin (VANCOREADY) IVPB 1500 mg/300 mL        1,500 mg 150 mL/hr over 120 Minutes Intravenous  Once 02/15/21 0849 02/15/21 1210   02/15/21 0845  cefTRIAXone (ROCEPHIN) 2 g in sodium chloride 0.9 % 100 mL IVPB  Status:  Discontinued        2 g 200 mL/hr over 30 Minutes Intravenous Every 24 hours 02/15/21 0836 02/17/21 1048   02/15/21 0845  vancomycin (VANCOREADY) IVPB 1000 mg/200 mL  Status:  Discontinued        1,000 mg 200 mL/hr over 60 Minutes Intravenous  Once 02/15/21 0836 02/15/21 0849   02/15/21 0745  azithromycin (ZITHROMAX) tablet 500 mg        500 mg Oral  Once 02/15/21 0733 02/15/21 0858   02/15/21 0000  azithromycin (ZITHROMAX) 250 MG tablet        250 mg Oral Daily 02/15/21 0739  Subjective: Reports feeling eager to go home  Objective: Vitals:   02/18/21 0345 02/18/21 0805 02/18/21 1226 02/18/21 1600  BP: (!) 150/84 (!) 146/80 (!) 150/82 (!) 139/93  Pulse: 83 86 84 94  Resp: '20 14 20 20  '$ Temp: 99.5 F (37.5 C) 98.5 F (36.9 C)  98.8 F (37.1 C)  TempSrc: Oral Oral  Oral  SpO2: 94% 98% 96% 96%  Weight:      Height:        Intake/Output Summary (Last 24 hours) at 02/18/2021 1636 Last data filed at 02/17/2021 1700 Gross per 24 hour  Intake 104.42 ml  Output --  Net 104.42 ml   Filed Weights   02/16/21 1034 02/17/21 0755 02/17/21 1118  Weight: 73.4 kg 73.7 kg 72.8 kg    Examination: General exam: Conversant, in no acute distress Respiratory system: normal chest  rise, clear, no audible wheezing Cardiovascular system: regular rhythm, s1-s2 Gastrointestinal system: Nondistended, nontender, pos BS Central nervous system: No seizures, no tremors Extremities: No cyanosis, no joint deformities Skin: No rashes, no pallor Psychiatry: Affect normal // no auditory hallucinations   Data Reviewed: I have personally reviewed following labs and imaging studies  CBC: Recent Labs  Lab 02/14/21 2022 02/16/21 0333 02/17/21 0830  WBC 8.3 6.8 4.7  NEUTROABS 7.0  --   --   HGB 10.0* 9.7* 10.2*  HCT 31.7* 29.6* 31.3*  MCV 94.3 90.0 90.5  PLT 265 252 [id number]   Basic Metabolic Panel: Recent Labs  Lab 02/14/21 2022 02/16/21 0333 02/17/21 0512  NA 138 134* 135  K 4.9 4.6 3.7  CL 95* 95* 97*  CO2 '25 22 26  '$ GLUCOSE 84 102* 94  BUN 59* 85* 44*  CREATININE 13.73* 17.01* 11.66*  CALCIUM 7.3* 6.7* 7.4*  PHOS  --  2.8 2.5   GFR: Estimated Creatinine Clearance: 8 mL/min (A) (by C-G formula based on SCr of 11.66 mg/dL (H)). Liver Function Tests: Recent Labs  Lab 02/14/21 2022 02/17/21 0512  AST 15  --   ALT 16  --   ALKPHOS 94  --   BILITOT 1.4*  --   PROT 6.6  --   ALBUMIN 3.0* 2.4*   No results for input(s): LIPASE, AMYLASE in the last 168 hours. No results for input(s): AMMONIA in the last 168 hours. Coagulation Profile: No results for input(s): INR, PROTIME in the last 168 hours. Cardiac Enzymes: No results for input(s): CKTOTAL, CKMB, CKMBINDEX, TROPONINI in the last 168 hours. BNP (last 3 results) No results for input(s): PROBNP in the last 8760 hours. HbA1C: No results for input(s): HGBA1C in the last 72 hours. CBG: No results for input(s): GLUCAP in the last 168 hours. Lipid Profile: No results for input(s): CHOL, HDL, LDLCALC, TRIG, CHOLHDL, LDLDIRECT in the last 72 hours. Thyroid Function Tests: No results for input(s): TSH, T4TOTAL, FREET4, T3FREE, THYROIDAB in the last 72 hours. Anemia Panel: No results for input(s): VITAMINB12,  FOLATE, FERRITIN, TIBC, IRON, RETICCTPCT in the last 72 hours. Sepsis Labs: Recent Labs  Lab 02/14/21 2045 02/15/21 0443 02/15/21 1329  PROCALCITON  --   --  8.64  LATICACIDVEN 1.2 1.5  --     Recent Results (from the past 240 hour(s))  Resp Panel by RT-PCR (Flu A&B, Covid) Nasopharyngeal Swab     Status: None   Collection Time: 02/09/21  5:15 AM   Specimen: Nasopharyngeal Swab; Nasopharyngeal(NP) swabs in vial transport medium  Result Value Ref Range Status   SARS Coronavirus 2 by RT  PCR NEGATIVE NEGATIVE Final    Comment: (NOTE) SARS-CoV-2 target nucleic acids are NOT DETECTED.  The SARS-CoV-2 RNA is generally detectable in upper respiratory specimens during the acute phase of infection. The lowest concentration of SARS-CoV-2 viral copies this assay can detect is 138 copies/mL. A negative result does not preclude SARS-Cov-2 infection and should not be used as the sole basis for treatment or other patient management decisions. A negative result may occur with  improper specimen collection/handling, submission of specimen other than nasopharyngeal swab, presence of viral mutation(s) within the areas targeted by this assay, and inadequate number of viral copies(<138 copies/mL). A negative result must be combined with clinical observations, patient history, and epidemiological information. The expected result is Negative.  Fact Sheet for Patients:  EntrepreneurPulse.com.au  Fact Sheet for Healthcare Providers:  IncredibleEmployment.be  This test is no t yet approved or cleared by the [place] FDA and  has been authorized for detection and/or diagnosis of SARS-CoV-2 by FDA under an Emergency Use Authorization (EUA). This EUA will remain  in effect (meaning this test can be used) for the duration of the COVID-19 declaration under Section 564(b)(1) of the Act, 21 U.S.C.section 360bbb-3(b)(1), unless the authorization is terminated  or  revoked sooner.       Influenza A by PCR NEGATIVE NEGATIVE Final   Influenza B by PCR NEGATIVE NEGATIVE Final    Comment: (NOTE) The Xpert Xpress SARS-CoV-2/FLU/RSV plus assay is intended as an aid in the diagnosis of influenza from Nasopharyngeal swab specimens and should not be used as a sole basis for treatment. Nasal washings and aspirates are unacceptable for Xpert Xpress SARS-CoV-2/FLU/RSV testing.  Fact Sheet for Patients: EntrepreneurPulse.com.au  Fact Sheet for Healthcare Providers: IncredibleEmployment.be  This test is not yet approved or cleared by the [place] FDA and has been authorized for detection and/or diagnosis of SARS-CoV-2 by FDA under an Emergency Use Authorization (EUA). This EUA will remain in effect (meaning this test can be used) for the duration of the COVID-19 declaration under Section 564(b)(1) of the Act, 21 U.S.C. section 360bbb-3(b)(1), unless the authorization is terminated or revoked.  Performed at [facility], [place] [address] [place], [place] [address]   Resp Panel by RT-PCR (Flu A&B, Covid) Nasopharyngeal Swab     Status: None   Collection Time: 02/14/21  8:10 PM   Specimen: Nasopharyngeal Swab; Nasopharyngeal(NP) swabs in vial transport medium  Result Value Ref Range Status   SARS Coronavirus 2 by RT PCR NEGATIVE NEGATIVE Final    Comment: (NOTE) SARS-CoV-2 target nucleic acids are NOT DETECTED.  The SARS-CoV-2 RNA is generally detectable in upper respiratory specimens during the acute phase of infection. The lowest concentration of SARS-CoV-2 viral copies this assay can detect is 138 copies/mL. A negative result does not preclude SARS-Cov-2 infection and should not be used as the sole basis for treatment or other patient management decisions. A negative result may occur with  improper specimen collection/handling, submission of specimen other than nasopharyngeal swab, presence of viral  mutation(s) within the areas targeted by this assay, and inadequate number of viral copies(<138 copies/mL). A negative result must be combined with clinical observations, patient history, and epidemiological information. The expected result is Negative.  Fact Sheet for Patients:  EntrepreneurPulse.com.au  Fact Sheet for Healthcare Providers:  IncredibleEmployment.be  This test is no t yet approved or cleared by the [place] FDA and  has been authorized for detection and/or diagnosis of SARS-CoV-2 by FDA under an Emergency Use Authorization (  EUA). This EUA will remain  in effect (meaning this test can be used) for the duration of the COVID-19 declaration under Section 564(b)(1) of the Act, 21 U.S.C.section 360bbb-3(b)(1), unless the authorization is terminated  or revoked sooner.       Influenza A by PCR NEGATIVE NEGATIVE Final   Influenza B by PCR NEGATIVE NEGATIVE Final    Comment: (NOTE) The Xpert Xpress SARS-CoV-2/FLU/RSV plus assay is intended as an aid in the diagnosis of influenza from Nasopharyngeal swab specimens and should not be used as a sole basis for treatment. Nasal washings and aspirates are unacceptable for Xpert Xpress SARS-CoV-2/FLU/RSV testing.  Fact Sheet for Patients: EntrepreneurPulse.com.au  Fact Sheet for Healthcare Providers: IncredibleEmployment.be  This test is not yet approved or cleared by the [place] FDA and has been authorized for detection and/or diagnosis of SARS-CoV-2 by FDA under an Emergency Use Authorization (EUA). This EUA will remain in effect (meaning this test can be used) for the duration of the COVID-19 declaration under Section 564(b)(1) of the Act, 21 U.S.C. section 360bbb-3(b)(1), unless the authorization is terminated or revoked.  Performed at [facility], [place] [address], [place], [place] [address]   Blood culture (routine x 2)      Status: Abnormal   Collection Time: 02/15/21  4:43 AM   Specimen: BLOOD RIGHT FOREARM  Result Value Ref Range Status   Specimen Description BLOOD RIGHT FOREARM  Final   Special Requests   Final    BOTTLES DRAWN AEROBIC AND ANAEROBIC Blood Culture results may not be optimal due to an inadequate volume of blood received in culture bottles   Culture  Setup Time   Final    GRAM POSITIVE COCCI IN CHAINS IN CLUSTERS IN BOTH AEROBIC AND ANAEROBIC BOTTLES CRITICAL RESULT CALLED TO, READ BACK BY AND VERIFIED WITH: PHARMD LAURA SEAY BY MESSAN H. AT 0012 ON 02/16/2021 Performed at [facility], [place] [address], [place], [place] [address]    Culture STREPTOCOCCUS CRISTATUS (A)  Final   Report Status 02/17/2021 FINAL  Final   Organism ID, Bacteria STREPTOCOCCUS CRISTATUS  Final      Susceptibility   Streptococcus cristatus - MIC*    PENICILLIN <=0.06 SENSITIVE Sensitive     CEFTRIAXONE <=0.12 SENSITIVE Sensitive     ERYTHROMYCIN <=0.12 SENSITIVE Sensitive     LEVOFLOXACIN 1 SENSITIVE Sensitive     VANCOMYCIN 0.5 SENSITIVE Sensitive     * STREPTOCOCCUS CRISTATUS  Blood Culture ID Panel (Reflexed)     Status: Abnormal   Collection Time: 02/15/21  4:43 AM  Result Value Ref Range Status   Enterococcus faecalis NOT DETECTED NOT DETECTED Final   Enterococcus Faecium NOT DETECTED NOT DETECTED Final   Listeria monocytogenes NOT DETECTED NOT DETECTED Final   Staphylococcus species NOT DETECTED NOT DETECTED Final   Staphylococcus aureus (BCID) NOT DETECTED NOT DETECTED Final   Staphylococcus epidermidis NOT DETECTED NOT DETECTED Final   Staphylococcus lugdunensis NOT DETECTED NOT DETECTED Final   Streptococcus species DETECTED (A) NOT DETECTED Final    Comment: Not Enterococcus species, Streptococcus agalactiae, Streptococcus pyogenes, or Streptococcus pneumoniae. CRITICAL RESULT CALLED TO, READ BACK BY AND VERIFIED WITH: PHARMD LAURA SEAY BY MESSAN H. AT 0012 ON 02/16/2021    Streptococcus  agalactiae NOT DETECTED NOT DETECTED Final   Streptococcus pneumoniae NOT DETECTED NOT DETECTED Final   Streptococcus pyogenes NOT DETECTED NOT DETECTED Final   A.calcoaceticus-baumannii NOT DETECTED NOT DETECTED Final   Bacteroides fragilis NOT DETECTED NOT DETECTED Final  Enterobacterales NOT DETECTED NOT DETECTED Final   Enterobacter cloacae complex NOT DETECTED NOT DETECTED Final   Escherichia coli NOT DETECTED NOT DETECTED Final   Klebsiella aerogenes NOT DETECTED NOT DETECTED Final   Klebsiella oxytoca NOT DETECTED NOT DETECTED Final   Klebsiella pneumoniae NOT DETECTED NOT DETECTED Final   Proteus species NOT DETECTED NOT DETECTED Final   Salmonella species NOT DETECTED NOT DETECTED Final   Serratia marcescens NOT DETECTED NOT DETECTED Final   Haemophilus influenzae NOT DETECTED NOT DETECTED Final   Neisseria meningitidis NOT DETECTED NOT DETECTED Final   Pseudomonas aeruginosa NOT DETECTED NOT DETECTED Final   Stenotrophomonas maltophilia NOT DETECTED NOT DETECTED Final   Candida albicans NOT DETECTED NOT DETECTED Final   Candida auris NOT DETECTED NOT DETECTED Final   Candida glabrata NOT DETECTED NOT DETECTED Final   Candida krusei NOT DETECTED NOT DETECTED Final   Candida parapsilosis NOT DETECTED NOT DETECTED Final   Candida tropicalis NOT DETECTED NOT DETECTED Final   Cryptococcus neoformans/gattii NOT DETECTED NOT DETECTED Final    Comment: Performed at [facility], [place] [address], [place], [place] [address]  Blood culture (routine x 2)     Status: Abnormal   Collection Time: 02/15/21  8:41 AM   Specimen: BLOOD  Result Value Ref Range Status   Specimen Description BLOOD SITE NOT SPECIFIED  Final   Special Requests   Final    BOTTLES DRAWN AEROBIC AND ANAEROBIC Blood Culture results may not be optimal due to an inadequate volume of blood received in culture bottles   Culture  Setup Time   Final    GRAM POSITIVE COCCI IN CHAINS IN CLUSTERS IN BOTH AEROBIC AND  ANAEROBIC BOTTLES CRITICAL RESULT CALLED TO, READ BACK BY AND VERIFIED WITH: CRITICAL VALUE NOTED.  VALUE IS CONSISTENT WITH PREVIOUSLY REPORTED AND CALLED VALUE.    Culture (A)  Final    STREPTOCOCCUS CRISTATUS SUSCEPTIBILITIES PERFORMED ON PREVIOUS CULTURE WITHIN THE LAST 5 DAYS. Performed at [facility], [place] [address], [place], [place] [address]    Report Status 02/17/2021 FINAL  Final  Respiratory (~20 pathogens) panel by PCR     Status: None   Collection Time: 02/15/21  8:41 AM   Specimen: Nasopharyngeal Swab; Respiratory  Result Value Ref Range Status   Adenovirus NOT DETECTED NOT DETECTED Final   Coronavirus 229E NOT DETECTED NOT DETECTED Final    Comment: (NOTE) The Coronavirus on the Respiratory Panel, DOES NOT test for the novel  Coronavirus (2019 nCoV)    Coronavirus HKU1 NOT DETECTED NOT DETECTED Final   Coronavirus NL63 NOT DETECTED NOT DETECTED Final   Coronavirus OC43 NOT DETECTED NOT DETECTED Final   Metapneumovirus NOT DETECTED NOT DETECTED Final   Rhinovirus / Enterovirus NOT DETECTED NOT DETECTED Final   Influenza A NOT DETECTED NOT DETECTED Final   Influenza B NOT DETECTED NOT DETECTED Final   Parainfluenza Virus 1 NOT DETECTED NOT DETECTED Final   Parainfluenza Virus 2 NOT DETECTED NOT DETECTED Final   Parainfluenza Virus 3 NOT DETECTED NOT DETECTED Final   Parainfluenza Virus 4 NOT DETECTED NOT DETECTED Final   Respiratory Syncytial Virus NOT DETECTED NOT DETECTED Final   Bordetella pertussis NOT DETECTED NOT DETECTED Final   Bordetella Parapertussis NOT DETECTED NOT DETECTED Final   Chlamydophila pneumoniae NOT DETECTED NOT DETECTED Final   Mycoplasma pneumoniae NOT DETECTED NOT DETECTED Final    Comment: Performed at [facility], [place] [address], [place], [place] [address]  Culture, blood (Routine X 2) w Reflex to ID Panel     Status: None (Preliminary result)   Collection Time: 02/17/21  5:12 AM   Specimen: BLOOD  Result Value Ref Range  Status   Specimen Description BLOOD RIGHT ANTECUBITAL  Final   Special Requests   Final    BOTTLES DRAWN AEROBIC AND ANAEROBIC Blood Culture results may not be optimal due to an excessive volume of blood received in culture bottles   Culture   Final    NO GROWTH 1 DAY Performed at [facility], [place] [address], [place], [place] [address]    Report Status PENDING  Incomplete  Culture, blood (Routine X 2) w Reflex to ID Panel     Status: None (Preliminary result)   Collection Time: 02/17/21  5:12 AM   Specimen: BLOOD RIGHT HAND  Result Value Ref Range Status   Specimen Description BLOOD RIGHT HAND  Final   Special Requests   Final    BOTTLES DRAWN AEROBIC AND ANAEROBIC Blood Culture adequate volume   Culture   Final    NO GROWTH 1 DAY Performed at [facility], [place] [address], [place],  [address]    Report Status PENDING  Incomplete     Radiology Studies: DG Orthopantogram  Result Date: 02/17/2021 CLINICAL DATA:  Bacteremia and fever EXAM: ORTHOPANTOGRAM/PANORAMIC COMPARISON:  None. FINDINGS: Panoramic view of the mandible was obtained and reveals no evidence of acute fracture. Slight decreased attenuation is noted in the left second mandibular molar consistent with dental caries. A few other scattered caries are noted. Erosive changes of the third maxillary molar on the left are seen likely related to extensive dental caries. No periapical lucencies are seen. IMPRESSION: Scattered dental caries worst in the left third maxillary molar. No periapical abscess is noted. Electronically Signed   By: Inez Catalina M.D.   On: 02/17/2021 19:20   ECHOCARDIOGRAM COMPLETE  Result Date: 02/17/2021    ECHOCARDIOGRAM REPORT   Patient Name:   AEON CAULLEY Date of Exam: 02/17/2021 Medical Rec #:  [id number]             Height:       72.0 in Accession #:    [id number]            Weight:       160.5 lb Date of Birth:  09-05-1971             BSA:          1.940 m Patient Age:    52  years              BP:           166/90 mmHg Patient Gender: M                     HR:           92 bpm. Exam Location:  Inpatient Procedure: 2D Echo, Cardiac Doppler and Color Doppler Indications:    Bacteremia  History:        Patient has no prior history of Echocardiogram examinations.                 Risk Factors:Hypertension.  Sonographer:    Luisa Hart RDCS Referring Phys: [place]  1. The aortic valve is tricuspid. There is mild thickening of the aortic valve. Aortic valve regurgitation is mild to moderate, but in the PLAX and PSAC views,  there appear to be both a central and a commissural jets.  2. Left ventricular ejection fraction, by estimation, is 55 to 60%. The left ventricle has normal function. The left ventricle has no regional wall motion abnormalities. The left ventricular internal cavity size was mildly dilated. There is moderate concentric left ventricular hypertrophy. Left ventricular diastolic parameters are consistent with Grade II diastolic dysfunction (pseudonormalization).  3. Right ventricular systolic function is normal. The right ventricular size is mildly enlarged. There is moderately elevated pulmonary artery systolic pressure.  4. Left atrial size was mildly dilated.  5. The mitral valve is abnormal. Mild to moderate mitral valve regurgitation. The mean mitral valve gradient is 5.0 mmHg.  6. Tricuspid valve regurgitation is moderate to severe.  7. Aortic dilatation noted. There is mild dilatation of the ascending aorta, measuring 40 mm. Comparison(s): No prior Echocardiogram. Conclusion(s)/Recommendation(s): Though no vegetation, TEE would be reasonable to consider evaluation of aortic valve, if clinically indicated. FINDINGS  Left Ventricle: Left ventricular ejection fraction, by estimation, is 55 to 60%. The left ventricle has normal function. The left ventricle has no regional wall motion abnormalities. The left ventricular internal cavity size was  mildly dilated. There is  moderate concentric left ventricular hypertrophy. Left ventricular diastolic parameters are consistent with Grade II diastolic dysfunction (pseudonormalization). Right Ventricle: The right ventricular size is mildly enlarged. No increase in right ventricular wall thickness. Right ventricular systolic function is normal. There is moderately elevated pulmonary artery systolic pressure. The tricuspid regurgitant velocity is 3.31 m/s, and with an assumed right atrial pressure of 3 mmHg, the estimated right ventricular systolic pressure is [id number]. Left Atrium: Left atrial size was mildly dilated. Right Atrium: Right atrial size was normal in size. Pericardium: There is no evidence of pericardial effusion. Mitral Valve: The mitral valve is abnormal. There is mild thickening of the mitral valve leaflet(s). Mild to moderate mitral valve regurgitation. MV peak gradient, 10.8 mmHg. The mean mitral valve gradient is 5.0 mmHg with average heart rate of 91 bpm. Tricuspid Valve: The tricuspid valve is normal in structure. Tricuspid valve regurgitation is moderate to severe. No evidence of tricuspid stenosis. Aortic Valve: The aortic valve is tricuspid. There is mild thickening of the aortic valve. Aortic valve regurgitation is mild. Aortic regurgitation PHT measures 351 msec. Aortic valve mean gradient measures 8.5 mmHg. Aortic valve peak gradient measures 15.2 mmHg. Aortic valve area, by VTI measures 3.86 cm. Pulmonic Valve: The pulmonic valve was normal in structure. Pulmonic valve regurgitation is not visualized. No evidence of pulmonic stenosis. Aorta: Aortic dilatation noted. There is mild dilatation of the ascending aorta, measuring 40 mm. IAS/Shunts: The atrial septum is grossly normal.  LEFT VENTRICLE PLAX 2D LVIDd:         6.00 cm      Diastology LVIDs:         4.70 cm      LV e' medial:    6.85 cm/s LV PW:         1.40 cm      LV E/e' medial:  17.5 LV IVS:        1.30 cm      LV e' lateral:    9.90 cm/s LVOT diam:     2.40 cm      LV E/e' lateral: 12.1 LV SV:         121 LV SV Index:   62 LVOT Area:     4.52 cm  LV Volumes (MOD) LV vol d, MOD A2C:  182.0 ml LV vol d, MOD A4C: 156.0 ml LV vol s, MOD A2C: 85.2 ml LV vol s, MOD A4C: 93.2 ml LV SV MOD A2C:     96.8 ml LV SV MOD A4C:     156.0 ml LV SV MOD BP:      80.9 ml RIGHT VENTRICLE RV Basal diam:  5.10 cm RV Mid diam:    3.50 cm RV S prime:     15.10 cm/s TAPSE (M-mode): 2.9 cm LEFT ATRIUM             Index       RIGHT ATRIUM           Index LA diam:        3.70 cm 1.91 cm/m  RA Area:     16.70 cm LA Vol (A2C):   64.3 ml 33.14 ml/m RA Volume:   43.00 ml  22.16 ml/m LA Vol (A4C):   59.6 ml 30.72 ml/m LA Biplane Vol: 66.1 ml 34.07 ml/m  AORTIC VALVE                    PULMONIC VALVE AV Area (Vmax):    4.04 cm     PV Vmax:       1.42 m/s AV Area (Vmean):   3.94 cm     PV Vmean:      106.000 cm/s AV Area (VTI):     3.86 cm     PV VTI:        0.270 m AV Vmax:           195.00 cm/s  PV Peak grad:  8.1 mmHg AV Vmean:          135.500 cm/s PV Mean grad:  5.0 mmHg AV VTI:            0.314 m AV Peak Grad:      15.2 mmHg AV Mean Grad:      8.5 mmHg LVOT Vmax:         174.00 cm/s LVOT Vmean:        118.000 cm/s LVOT VTI:          0.268 m LVOT/AV VTI ratio: 0.85 AI PHT:            351 msec  AORTA Ao Root diam: 3.50 cm Ao Asc diam:  3.75 cm MITRAL VALVE                 TRICUSPID VALVE MV Area (PHT): 5.23 cm      TR Peak grad:   43.8 mmHg MV Area VTI:   3.28 cm      TR Vmax:        331.00 cm/s MV Peak grad:  10.8 mmHg MV Mean grad:  5.0 mmHg      SHUNTS MV Vmax:       1.64 m/s      Systemic VTI:  0.27 m MV Vmean:      105.0 cm/s    Systemic Diam: 2.40 cm MV Decel Time: 145 msec MR Peak grad:    114.9 mmHg MR Mean grad:    77.0 mmHg MR Vmax:         536.00 cm/s MR Vmean:        424.0 cm/s MR PISA:         1.01 cm MR PISA Eff ROA: 6 mm MR PISA Radius:  0.40 cm MV E velocity: 120.00 cm/s MV A velocity: 88.90 cm/s MV E/A  ratio:  1.35 Rudean Haskell MD  Electronically signed by Rudean Haskell MD Signature Date/Time: 02/17/2021/7:10:14 PM    Final     Scheduled Meds: . amLODipine  10 mg Oral Daily  . benzonatate  100 mg Oral Q8H  . calcitRIOL  0.25 mcg Oral Q M,W,F-HD  . calcium carbonate  2 tablet Oral TID WC  . calcium carbonate  2 tablet Oral QHS  . Chlorhexidine Gluconate Cloth  6 each Topical Q0600  . Chlorhexidine Gluconate Cloth  6 each Topical Q0600  . [START ON 02/19/2021] Chlorhexidine Gluconate Cloth  6 each Topical Q0600  . darbepoetin (ARANESP) injection - DIALYSIS  60 mcg Intravenous Q Wed-HD  . heparin  5,000 Units Subcutaneous Q8H  . hydrALAZINE  50 mg Oral TID  . multivitamin  1 tablet Oral QHS  . sodium chloride flush  3 mL Intravenous Q12H   Continuous Infusions: .  ceFAZolin (ANCEF) IV 2 g (02/17/21 1217)     LOS: 3 days   Marylu Lund, MD [facility] Pager On Amion  If 7PM-7AM, please contact night-coverage 02/18/2021, 4:36 PM

## 2021-02-18 NOTE — Progress Notes (Signed)
[name]  Date of Admission:  02/14/2021           Reason for visit: Follow up on streptococcal bacteremia  Current antibiotics: Cefazolin 5/18--present  Previous antibiotics: Ceftriaxone 5/16--5/17 Vancomycin 5/16--5/16 Azithromycin 5/16--5/16  ASSESSMENT:    1. Streptococcus cristatus bacteremia: Blood cultures 02/15/2021 positive for strep cristatus, a viridans group strep.  Repeat blood cultures drawn yesterday remain no growth to date.  Currently on cefazolin with overall improvement in his symptoms.  Remains afebrile this morning with a T-max in the last 24 hours of 100.6 yesterday evening.  TTE was inconclusive for infective endocarditis and TEE is pending given his concerning presentation for subacute endocarditis.  No clear source of infection, however, orthopantogram did reveal multiple dental caries. 2. Hepatitis B: Surface Ag positive, HBV DNA PCR pending.  3. End-stage renal [name]: On hemodialysis  PLAN:    . Continue cefazolin dosed for HD . Await TEE . Follow-up repeat blood cultures . Will need dentistry follow-up outpatient . Follow-up hepatitis B serologies . HD schedule per nephrology   Principal Problem:   Streptococcal bacteremia Active Problems:   ESRD (end stage renal [name]) on dialysis [facility])   Essential hypertension   Chronic viral hepatitis B without delta-agent (HCC)    MEDICATIONS:    Scheduled Meds: . amLODipine  10 mg Oral Daily  . benzonatate  100 mg Oral Q8H  . calcitRIOL  0.25 mcg Oral Q M,W,F-HD  . calcium carbonate  2 tablet Oral TID WC  . calcium carbonate  2 tablet Oral QHS  . Chlorhexidine Gluconate Cloth  6 each Topical Q0600  . Chlorhexidine Gluconate Cloth  6 each Topical Q0600  . darbepoetin (ARANESP) injection - DIALYSIS  60 mcg Intravenous Q Wed-HD  . heparin  5,000 Units Subcutaneous Q8H  . hydrALAZINE  50 mg Oral TID  . multivitamin  1 tablet Oral QHS  . sodium chloride flush  3  mL Intravenous Q12H   Continuous Infusions: .  ceFAZolin (ANCEF) IV 2 g (02/17/21 1217)   PRN Meds:.acetaminophen **OR** acetaminophen, camphor-menthol **AND** hydrOXYzine, docusate sodium, feeding supplement (NEPRO CARB STEADY), HYDROcodone-acetaminophen, sorbitol, zolpidem  SUBJECTIVE:   24 hour events:  No acute events  Patient reports feeling better this morning.  Denies fevers or chills.  He has no pain.  He is tolerating antibiotics.  He understands procedure tomorrow for TEE likely after his dialysis.  Review of Systems  All other systems reviewed and are negative.     OBJECTIVE:   Blood pressure (!) 146/80, pulse 86, temperature 98.5 F (36.9 C), temperature source Oral, resp. rate 14, height 6' (1.829 m), weight 72.8 kg, SpO2 98 %. Body mass index is 21.77 kg/m.  Physical Exam Constitutional:      General: He is not in acute distress.    Appearance: Normal appearance.  HENT:     Head: Normocephalic and atraumatic.  Eyes:     Extraocular Movements: Extraocular movements intact.     Conjunctiva/sclera: Conjunctivae normal.  Pulmonary:     Effort: Pulmonary effort is normal. No respiratory distress.  Musculoskeletal:        General: Normal range of motion.     Cervical back: Normal range of motion and neck supple.  Skin:    General: Skin is warm and dry.     Findings: No rash.  Neurological:     General: No focal deficit present.     Mental Status: He is alert and oriented  to person, place, and time.  Psychiatric:        Mood and Affect: Mood normal.        Behavior: Behavior normal.      Lab Results: Lab Results  Component Value Date   WBC 4.7 02/17/2021   HGB 10.2 (L) 02/17/2021   HCT 31.3 (L) 02/17/2021   MCV 90.5 02/17/2021   PLT 303 02/17/2021    Lab Results  Component Value Date   NA 135 02/17/2021   K 3.7 02/17/2021   CO2 26 02/17/2021   GLUCOSE 94 02/17/2021   BUN 44 (H) 02/17/2021   CREATININE 11.66 (H) 02/17/2021   CALCIUM 7.4 (L)  02/17/2021   GFRNONAA 5 (L) 02/17/2021   GFRAA 7 (L) 02/20/2020    Lab Results  Component Value Date   ALT 16 02/14/2021   AST 15 02/14/2021   ALKPHOS 94 02/14/2021   BILITOT 1.4 (H) 02/14/2021       Component Value Date/Time   CRP 14.3 (H) 02/15/2021 0819       Component Value Date/Time   ESRSEDRATE 20 (H) 02/15/2021 [id number]     I have reviewed the micro and lab results in Epic.  Imaging: DG Orthopantogram  Result Date: 02/17/2021 CLINICAL DATA:  Bacteremia and fever EXAM: ORTHOPANTOGRAM/PANORAMIC COMPARISON:  None. FINDINGS: Panoramic view of the mandible was obtained and reveals no evidence of acute fracture. Slight decreased attenuation is noted in the left second mandibular molar consistent with dental caries. A few other scattered caries are noted. Erosive changes of the third maxillary molar on the left are seen likely related to extensive dental caries. No periapical lucencies are seen. IMPRESSION: Scattered dental caries worst in the left third maxillary molar. No periapical abscess is noted. Electronically Signed   By: Inez Catalina M.D.   On: 02/17/2021 19:20   ECHOCARDIOGRAM COMPLETE  Result Date: 02/17/2021    ECHOCARDIOGRAM REPORT   Patient Name:   [name] Date of Exam: 02/17/2021 Medical Rec #:  [id number]             Height:       72.0 in Accession #:    [id number]            Weight:       160.5 lb Date of Birth:  [birth year]             BSA:          1.940 m Patient Age:    51 years              BP:           166/90 mmHg Patient Gender: M                     HR:           92 bpm. Exam Location:  Inpatient Procedure: 2D Echo, Cardiac Doppler and Color Doppler Indications:    Bacteremia  History:        Patient has no prior history of Echocardiogram examinations.                 Risk Factors:Hypertension.  Sonographer:    Luisa Hart RDCS Referring Phys: [place]  1. The aortic valve is tricuspid. There is mild thickening of the aortic  valve. Aortic valve regurgitation is mild to moderate, but in the PLAX and PSAC views, there appear to be both a central and a commissural jets.  2.  Left ventricular ejection fraction, by estimation, is 55 to 60%. The left ventricle has normal function. The left ventricle has no regional wall motion abnormalities. The left ventricular internal cavity size was mildly dilated. There is moderate concentric left ventricular hypertrophy. Left ventricular diastolic parameters are consistent with Grade II diastolic dysfunction (pseudonormalization).  3. Right ventricular systolic function is normal. The right ventricular size is mildly enlarged. There is moderately elevated pulmonary artery systolic pressure.  4. Left atrial size was mildly dilated.  5. The mitral valve is abnormal. Mild to moderate mitral valve regurgitation. The mean mitral valve gradient is 5.0 mmHg.  6. Tricuspid valve regurgitation is moderate to severe.  7. Aortic dilatation noted. There is mild dilatation of the ascending aorta, measuring 40 mm. Comparison(s): No prior Echocardiogram. Conclusion(s)/Recommendation(s): Though no vegetation, TEE would be reasonable to consider evaluation of aortic valve, if clinically indicated. FINDINGS  Left Ventricle: Left ventricular ejection fraction, by estimation, is 55 to 60%. The left ventricle has normal function. The left ventricle has no regional wall motion abnormalities. The left ventricular internal cavity size was mildly dilated. There is  moderate concentric left ventricular hypertrophy. Left ventricular diastolic parameters are consistent with Grade II diastolic dysfunction (pseudonormalization). Right Ventricle: The right ventricular size is mildly enlarged. No increase in right ventricular wall thickness. Right ventricular systolic function is normal. There is moderately elevated pulmonary artery systolic pressure. The tricuspid regurgitant velocity is 3.31 m/s, and with an assumed right atrial  pressure of 3 mmHg, the estimated right ventricular systolic pressure is [id number]. Left Atrium: Left atrial size was mildly dilated. Right Atrium: Right atrial size was normal in size. Pericardium: There is no evidence of pericardial effusion. Mitral Valve: The mitral valve is abnormal. There is mild thickening of the mitral valve leaflet(s). Mild to moderate mitral valve regurgitation. MV peak gradient, 10.8 mmHg. The mean mitral valve gradient is 5.0 mmHg with average heart rate of 91 bpm. Tricuspid Valve: The tricuspid valve is normal in structure. Tricuspid valve regurgitation is moderate to severe. No evidence of tricuspid stenosis. Aortic Valve: The aortic valve is tricuspid. There is mild thickening of the aortic valve. Aortic valve regurgitation is mild. Aortic regurgitation PHT measures 351 msec. Aortic valve mean gradient measures 8.5 mmHg. Aortic valve peak gradient measures 15.2 mmHg. Aortic valve area, by VTI measures 3.86 cm. Pulmonic Valve: The pulmonic valve was normal in structure. Pulmonic valve regurgitation is not visualized. No evidence of pulmonic stenosis. Aorta: Aortic dilatation noted. There is mild dilatation of the ascending aorta, measuring 40 mm. IAS/Shunts: The atrial septum is grossly normal.  LEFT VENTRICLE PLAX 2D LVIDd:         6.00 cm      Diastology LVIDs:         4.70 cm      LV e' medial:    6.85 cm/s LV PW:         1.40 cm      LV E/e' medial:  17.5 LV IVS:        1.30 cm      LV e' lateral:   9.90 cm/s LVOT diam:     2.40 cm      LV E/e' lateral: 12.1 LV SV:         121 LV SV Index:   62 LVOT Area:     4.52 cm  LV Volumes (MOD) LV vol d, MOD A2C: 182.0 ml LV vol d, MOD A4C: 156.0 ml LV vol s, MOD A2C:  85.2 ml LV vol s, MOD A4C: 93.2 ml LV SV MOD A2C:     96.8 ml LV SV MOD A4C:     156.0 ml LV SV MOD BP:      80.9 ml RIGHT VENTRICLE RV Basal diam:  5.10 cm RV Mid diam:    3.50 cm RV S prime:     15.10 cm/s TAPSE (M-mode): 2.9 cm LEFT ATRIUM             Index       RIGHT  ATRIUM           Index LA diam:        3.70 cm 1.91 cm/m  RA Area:     16.70 cm LA Vol (A2C):   64.3 ml 33.14 ml/m RA Volume:   43.00 ml  22.16 ml/m LA Vol (A4C):   59.6 ml 30.72 ml/m LA Biplane Vol: 66.1 ml 34.07 ml/m  AORTIC VALVE                    PULMONIC VALVE AV Area (Vmax):    4.04 cm     PV Vmax:       1.42 m/s AV Area (Vmean):   3.94 cm     PV Vmean:      106.000 cm/s AV Area (VTI):     3.86 cm     PV VTI:        0.270 m AV Vmax:           195.00 cm/s  PV Peak grad:  8.1 mmHg AV Vmean:          135.500 cm/s PV Mean grad:  5.0 mmHg AV VTI:            0.314 m AV Peak Grad:      15.2 mmHg AV Mean Grad:      8.5 mmHg LVOT Vmax:         174.00 cm/s LVOT Vmean:        118.000 cm/s LVOT VTI:          0.268 m LVOT/AV VTI ratio: 0.85 AI PHT:            351 msec  AORTA Ao Root diam: 3.50 cm Ao Asc diam:  3.75 cm MITRAL VALVE                 TRICUSPID VALVE MV Area (PHT): 5.23 cm      TR Peak grad:   43.8 mmHg MV Area VTI:   3.28 cm      TR Vmax:        331.00 cm/s MV Peak grad:  10.8 mmHg MV Mean grad:  5.0 mmHg      SHUNTS MV Vmax:       1.64 m/s      Systemic VTI:  0.27 m MV Vmean:      105.0 cm/s    Systemic Diam: 2.40 cm MV Decel Time: 145 msec MR Peak grad:    114.9 mmHg MR Mean grad:    77.0 mmHg MR Vmax:         536.00 cm/s MR Vmean:        424.0 cm/s MR PISA:         1.01 cm MR PISA Eff ROA: 6 mm MR PISA Radius:  0.40 cm MV E velocity: 120.00 cm/s MV A velocity: 88.90 cm/s MV E/A ratio:  1.35 Rudean Haskell MD Electronically signed by Rudean Haskell MD Signature  Date/Time: 02/17/2021/7:10:14 PM    Final      Imaging  independently reviewed in Epic.    [name] [phone] pager 02/18/2021, 12:16 PM

## 2021-02-19 ENCOUNTER — Inpatient Hospital Stay (HOSPITAL_COMMUNITY): Payer: Medicare Other

## 2021-02-19 ENCOUNTER — Encounter (HOSPITAL_COMMUNITY)
Admission: EM | Disposition: A | Discharge status: Home / Self Care | Payer: Self-pay | Source: Home / Self Care | Attending: Internal Medicine

## 2021-02-19 ENCOUNTER — Inpatient Hospital Stay (HOSPITAL_COMMUNITY): Payer: Medicare Other | Admitting: Anesthesiology

## 2021-02-19 ENCOUNTER — Encounter (HOSPITAL_COMMUNITY): Payer: Self-pay | Admitting: Internal Medicine

## 2021-02-19 DIAGNOSIS — R7881 Bacteremia: Secondary | ICD-10-CM

## 2021-02-19 DIAGNOSIS — I340 Nonrheumatic mitral (valve) insufficiency: Secondary | ICD-10-CM

## 2021-02-19 DIAGNOSIS — I361 Nonrheumatic tricuspid (valve) insufficiency: Secondary | ICD-10-CM

## 2021-02-19 HISTORY — PX: TEE WITHOUT CARDIOVERSION: SHX5443

## 2021-02-19 LAB — CBC
HCT: 31.7 % — ABNORMAL LOW (ref 39.0–52.0)
Hemoglobin: 10.2 g/dL — ABNORMAL LOW (ref 13.0–17.0)
MCH: 29.3 pg (ref 26.0–34.0)
MCHC: 32.2 g/dL (ref 30.0–36.0)
MCV: 91.1 fL (ref 80.0–100.0)
Platelets: 381 10*3/uL (ref 150–400)
RBC: 3.48 MIL/uL — ABNORMAL LOW (ref 4.22–5.81)
RDW: 16.5 % — ABNORMAL HIGH (ref 11.5–15.5)
WBC: 7.6 10*3/uL (ref 4.0–10.5)
nRBC: 0 % (ref 0.0–0.2)

## 2021-02-19 LAB — HCV FIBROSURE
ALPHA 2-MACROGLOBULINS, QN: 230 mg/dL (ref 110–276)
ALT (SGPT) P5P: 15 IU/L (ref 0–55)
Apolipoprotein A-1: 91 mg/dL — ABNORMAL LOW (ref 101–178)
Bilirubin, Total: 0.2 mg/dL (ref 0.0–1.2)
Fibrosis Score: 0.25 — ABNORMAL HIGH (ref 0.00–0.21)
GGT: 60 IU/L (ref 0–65)
Haptoglobin: 242 mg/dL (ref 23–355)
Necroinflammat Activity Score: 0.05 (ref 0.00–0.17)

## 2021-02-19 LAB — RENAL FUNCTION PANEL
Albumin: 2.6 g/dL — ABNORMAL LOW (ref 3.5–5.0)
Anion gap: 12 (ref 5–15)
BUN: 38 mg/dL — ABNORMAL HIGH (ref 6–20)
CO2: 25 mmol/L (ref 22–32)
Calcium: 7.4 mg/dL — ABNORMAL LOW (ref 8.9–10.3)
Chloride: 98 mmol/L (ref 98–111)
Creatinine, Ser: 12.51 mg/dL — ABNORMAL HIGH (ref 0.61–1.24)
GFR, Estimated: 4 mL/min — ABNORMAL LOW (ref >60)
Glucose, Bld: 85 mg/dL (ref 70–99)
Phosphorus: 2.3 mg/dL — ABNORMAL LOW (ref 2.5–4.6)
Potassium: 4.4 mmol/L (ref 3.5–5.1)
Sodium: 135 mmol/L (ref 135–145)

## 2021-02-19 SURGERY — ECHOCARDIOGRAM, TRANSESOPHAGEAL
Anesthesia: Monitor Anesthesia Care

## 2021-02-19 MED ORDER — BUTAMBEN-TETRACAINE-BENZOCAINE 2-2-14 % EX AERO
INHALATION_SPRAY | CUTANEOUS | Status: DC | PRN
  Administered 2021-02-19: 2 via TOPICAL

## 2021-02-19 MED ORDER — SODIUM CHLORIDE 0.9 % IV SOLN: 100.0000 mL | INTRAVENOUS | Status: DC | PRN

## 2021-02-19 MED ORDER — PROPOFOL 500 MG/50ML IV EMUL
INTRAVENOUS | Status: DC | PRN
  Administered 2021-02-19: 150 ug/kg/min via INTRAVENOUS

## 2021-02-19 MED ORDER — CEFAZOLIN SODIUM-DEXTROSE 2-4 GM/100ML-% IV SOLN: 2.0000 g | INTRAVENOUS | Status: AC

## 2021-02-19 MED ORDER — HEPARIN SODIUM (PORCINE) 1000 UNIT/ML DIALYSIS: 20.0000 [IU]/kg | INTRAMUSCULAR | Status: DC | PRN

## 2021-02-19 MED ORDER — ALTEPLASE 2 MG IJ SOLR: 2.0000 mg | Freq: Once | INTRAMUSCULAR | Status: DC | PRN

## 2021-02-19 MED ORDER — LIDOCAINE-PRILOCAINE 2.5-2.5 % EX CREA: 1.0000 "application " | TOPICAL_CREAM | CUTANEOUS | Status: DC | PRN

## 2021-02-19 MED ORDER — PROPOFOL 10 MG/ML IV BOLUS
INTRAVENOUS | Status: DC | PRN
  Administered 2021-02-19 (×2): 20 mg via INTRAVENOUS

## 2021-02-19 MED ORDER — CALCITRIOL 0.25 MCG PO CAPS
ORAL_CAPSULE | ORAL | Status: AC
  Filled 2021-02-19: qty 1

## 2021-02-19 MED ORDER — DEXMEDETOMIDINE (PRECEDEX) IN NS 20 MCG/5ML (4 MCG/ML) IV SYRINGE
PREFILLED_SYRINGE | INTRAVENOUS | Status: DC | PRN
  Administered 2021-02-19: 8 ug via INTRAVENOUS

## 2021-02-19 MED ORDER — LIDOCAINE HCL (PF) 1 % IJ SOLN: 5.0000 mL | INTRAMUSCULAR | Status: DC | PRN

## 2021-02-19 MED ORDER — HEPARIN SODIUM (PORCINE) 1000 UNIT/ML DIALYSIS
1000.0000 [IU] | INTRAMUSCULAR | Status: DC | PRN
  Administered 2021-02-19: 1000 [IU] via INTRAVENOUS_CENTRAL

## 2021-02-19 MED ORDER — HEPARIN SODIUM (PORCINE) 1000 UNIT/ML IJ SOLN
INTRAMUSCULAR | Status: AC
  Filled 2021-02-19: qty 1

## 2021-02-19 MED ORDER — SODIUM CHLORIDE 0.9 % IV SOLN: INTRAVENOUS | Status: DC

## 2021-02-19 MED ORDER — LIDOCAINE 2% (20 MG/ML) 5 ML SYRINGE
INTRAMUSCULAR | Status: DC | PRN
  Administered 2021-02-19: 60 mg via INTRAVENOUS

## 2021-02-19 NOTE — Discharge Summary (Signed)
Physician Discharge Summary  [facility]x [facility] [id number] DOB: [birth year] DOA: 02/14/2021  PCP: Patient, No Pcp Per (Inactive)  Admit date: 02/14/2021 Discharge date: 02/19/2021  Admitted From: Home Disposition:  Home  Recommendations for Outpatient Follow-up:  1. Follow up with PCP in 2-3 weeks 2. Follow up with ID as scheduled on 6/2 3. Complete course of IV abx while on HD through 6/15  Discharge Condition:Stable CODE STATUS:Full Diet recommendation: Renal   Brief/Interim Summary: 50 y.o.malewith a history of ESRD (MWF), HTN, hypothyroidism, depression/anxiety and previous TDC infection/bacteremia who presented to the ED 5/15 with fever and some cough found to have pulmonary infiltrates and blood cultures that ultimately grew strep species  Discharge Diagnoses:  Principal Problem:   Streptococcal bacteremia Active Problems:   ESRD (end stage renal disease) on dialysis [facility])   Essential hypertension   Chronic viral hepatitis B without delta-agent ([place])  Sepsis due to Streptococcal bacteremia: AVG doesn't appear infected. Having   - ID had been following -TTE reviewed, no evidence of vegetation on that study -Follow up TEE was performed, neg for vegetation -Currently continued on cefazolin - Repeat blood cultures5/18 obtained, neg thus far - See below. IR initially consulted for possible aspiration of L renal lesion, however area is not amenable given location. Discussed with ID - discussed with ID. Recommendation to complete course of cefazolin given post-HD through 6/15. Have discussed with Nephrology  Indeterminate left renal lesion:  - see above. Discussed with ID and IR. Area is not amenable to aspiration given location -Had remained stable  Atypical pneumonia:  - Continue with antibiotics as above.  ESRD: MWF thru femoral AVG.  - Nephrology had been following -Cont HD per nephro  HTN - Continuenorvasc,hydralazine as tolerated  Discharge  Instructions   Allergies as of 02/19/2021      Reactions   Ivp Dye [iodinated Diagnostic Agents] Swelling   SWELLING REACTION UNSPECIFIED    Lisinopril Cough   Solu-medrol [methylprednisolone] Nausea And Vomiting      Medication List    TAKE these medications   acetaminophen 500 MG tablet Commonly known as: TYLENOL Take 1,000 mg by mouth daily as needed (for pain or headaches).   albuterol 108 (90 Base) MCG/ACT inhaler Commonly known as: VENTOLIN HFA Inhale 1-2 puffs into the lungs every 6 (six) hours as needed for wheezing.   amLODipine 10 MG tablet Commonly known as: NORVASC Take 10 mg by mouth daily.   benzonatate 100 MG capsule Commonly known as: TESSALON Take 1 capsule (100 mg total) by mouth every 8 (eight) hours.   ceFAZolin 2-4 GM/100ML-% IVPB Commonly known as: ANCEF Inject 100 mLs (2 g total) into the vein every Monday, Wednesday, and Friday with hemodialysis for 26 days.   DIALYVITE TABLET Tabs Take 1 tablet by mouth daily.   hydrALAZINE 50 MG tablet Commonly known as: APRESOLINE Take 50 mg by mouth 3 (three) times daily.   HYDROcodone-acetaminophen 5-325 MG tablet Commonly known as: Norco Take 1 tablet by mouth every 4 (four) hours as needed (cough). What changed: reasons to take this   lidocaine-prilocaine cream Commonly known as: EMLA Apply 1 application topically daily as needed (port access).   Tums Ultra 1000 400 MG chewable tablet Generic drug: calcium elemental as carbonate Chew 4,000 mg by mouth 3 (three) times daily with meals.       Follow-up Information    Follow up with your pcp in 2-3 weeks. Schedule an appointment as soon as possible for a visit.  Mignon Pine, DO. Go on 03/04/2021.   Specialties: Infectious Diseases, Internal Medicine Why: as scheduled Contact information: [address] [place] [address] [phone]              Allergies  Allergen Reactions  . Ivp Dye [Iodinated  Diagnostic Agents] Swelling    SWELLING REACTION UNSPECIFIED   . Lisinopril Cough  . Solu-Medrol [Methylprednisolone] Nausea And Vomiting    Consultations:  ID  Nephrology  Cardiology  Procedures/Studies: CT ABDOMEN PELVIS WO CONTRAST  Result Date: 02/15/2021 CLINICAL DATA:  Fever of unknown origin EXAM: CT CHEST, ABDOMEN AND PELVIS WITHOUT CONTRAST TECHNIQUE: Multidetector CT imaging of the chest, abdomen and pelvis was performed following the standard protocol without IV contrast. COMPARISON:  12/13/2016 abdominal CT FINDINGS: CT CHEST FINDINGS Cardiovascular: Normal heart size. Border. Calcified fibrin sheath at the left brachiocephalic and right brachiocephalic veins, extending into the SVC. These vessels appear small caliber in places. Atheromatous calcification. The ascending aorta is 4 cm in diameter. Mediastinum/Nodes: No adenopathy or mass.  Parathyroidectomy changes Lungs/Pleura: Mild patchy subpleural opacity at the bases. These are small and more flat appearing than would be expected for infarct. Mild fissure and interlobular septal thickening at the bases. Musculoskeletal: No acute finding. CT ABDOMEN PELVIS FINDINGS Hepatobiliary: No focal liver abnormality.No evidence of biliary obstruction or stone. Pancreas: Unremarkable. Spleen: Unremarkable. Adrenals/Urinary Tract: Negative adrenals. Polycystic kidneys with atrophic intervening parenchyma. Multiple cysts with mural calcifications. There also a few high-density proteinaceous appearing lesion/cysts. There is a notable low-density lesion in the left lower kidney measuring 3.7 cm with hazy or margins and internal isodensity. No hydronephrosis. Negative collapsed bladder. Stomach/Bowel:  No obstruction. No visible bowel inflammation Vascular/Lymphatic: Diffuse arterial calcification. Dialysis access at the left thigh with a chronic collection that has decreased in size and increase in peripheral densities since 2018, deforming the  adjacent emanating grafts. Grafts have been added to the right thigh since prior. Right iliac vein stenting. No mass or adenopathy. Reproductive:No pathologic findings. Other: No ascites or pneumoperitoneum. Musculoskeletal: Renal osteodystrophy.  No acute finding IMPRESSION: 1. Mild patchy bilateral airspace disease suspicious for atypical pneumonia. 2. Polycystic kidneys. Notable indistinct left renal cyst/lesion which may be related to recent intracystic hemorrhage or infection in this setting. 3. Cardiomegaly and trace pulmonary edema. 4. Chronic findings are described above. Electronically Signed   By: Monte Fantasia M.D.   On: 02/15/2021 07:17   DG Orthopantogram  Result Date: 02/17/2021 CLINICAL DATA:  Bacteremia and fever EXAM: ORTHOPANTOGRAM/PANORAMIC COMPARISON:  None. FINDINGS: Panoramic view of the mandible was obtained and reveals no evidence of acute fracture. Slight decreased attenuation is noted in the left second mandibular molar consistent with dental caries. A few other scattered caries are noted. Erosive changes of the third maxillary molar on the left are seen likely related to extensive dental caries. No periapical lucencies are seen. IMPRESSION: Scattered dental caries worst in the left third maxillary molar. No periapical abscess is noted. Electronically Signed   By: Inez Catalina M.D.   On: 02/17/2021 19:20   DG Chest 1 View  Result Date: 01/24/2021 CLINICAL DATA:  Sore throat and nonproductive cough x3 weeks EXAM: CHEST  1 VIEW COMPARISON:  Feb 20, 2020 FINDINGS: The heart size and mediastinal contours are within normal limits. Surgical clips overlie the thoracic inlet. Hazy left basilar opacity with partial obscuration of the left hemidiaphragm. No significant pleural effusion or visible pneumothorax. The visualized skeletal structures are unremarkable. IMPRESSION: Hazy left basilar opacity  with partial obscuration of the left hemidiaphragm could reflect atelectasis or infiltrate.  Electronically Signed   By: Dahlia Bailiff MD   On: 01/24/2021 21:32   DG Chest 2 View  Result Date: 02/14/2021 CLINICAL DATA:  Shortness of breath EXAM: CHEST - 2 VIEW COMPARISON:  Feb 09, 2021 FINDINGS: Mild cardiac enlargement, similar prior. No focal consolidation. No pleural effusion. No pneumothorax. Surgical clips overlie the neck. The visualized skeletal structures are unremarkable. IMPRESSION: No acute cardiopulmonary disease. Electronically Signed   By: Dahlia Bailiff MD   On: 02/14/2021 22:28   CT Chest Wo Contrast  Result Date: 02/15/2021 CLINICAL DATA:  Fever of unknown origin EXAM: CT CHEST, ABDOMEN AND PELVIS WITHOUT CONTRAST TECHNIQUE: Multidetector CT imaging of the chest, abdomen and pelvis was performed following the standard protocol without IV contrast. COMPARISON:  12/13/2016 abdominal CT FINDINGS: CT CHEST FINDINGS Cardiovascular: Normal heart size. Border. Calcified fibrin sheath at the left brachiocephalic and right brachiocephalic veins, extending into the SVC. These vessels appear small caliber in places. Atheromatous calcification. The ascending aorta is 4 cm in diameter. Mediastinum/Nodes: No adenopathy or mass.  Parathyroidectomy changes Lungs/Pleura: Mild patchy subpleural opacity at the bases. These are small and more flat appearing than would be expected for infarct. Mild fissure and interlobular septal thickening at the bases. Musculoskeletal: No acute finding. CT ABDOMEN PELVIS FINDINGS Hepatobiliary: No focal liver abnormality.No evidence of biliary obstruction or stone. Pancreas: Unremarkable. Spleen: Unremarkable. Adrenals/Urinary Tract: Negative adrenals. Polycystic kidneys with atrophic intervening parenchyma. Multiple cysts with mural calcifications. There also a few high-density proteinaceous appearing lesion/cysts. There is a notable low-density lesion in the left lower kidney measuring 3.7 cm with hazy or margins and internal isodensity. No hydronephrosis. Negative  collapsed bladder. Stomach/Bowel:  No obstruction. No visible bowel inflammation Vascular/Lymphatic: Diffuse arterial calcification. Dialysis access at the left thigh with a chronic collection that has decreased in size and increase in peripheral densities since 2018, deforming the adjacent emanating grafts. Grafts have been added to the right thigh since prior. Right iliac vein stenting. No mass or adenopathy. Reproductive:No pathologic findings. Other: No ascites or pneumoperitoneum. Musculoskeletal: Renal osteodystrophy.  No acute finding IMPRESSION: 1. Mild patchy bilateral airspace disease suspicious for atypical pneumonia. 2. Polycystic kidneys. Notable indistinct left renal cyst/lesion which may be related to recent intracystic hemorrhage or infection in this setting. 3. Cardiomegaly and trace pulmonary edema. 4. Chronic findings are described above. Electronically Signed   By: Monte Fantasia M.D.   On: 02/15/2021 07:17   DG Chest Port 1 View  Result Date: 02/09/2021 CLINICAL DATA:  Cough, shortness of breath. EXAM: PORTABLE CHEST 1 VIEW COMPARISON:  Chest x-ray 01/24/2021 FINDINGS: The heart size and mediastinal contours are within normal limits. No focal consolidation. No pulmonary edema. No pleural effusion. No pneumothorax. No acute osseous abnormality.  Surgical clips overlie the neck. IMPRESSION: No active disease. Electronically Signed   By: Iven Finn M.D.   On: 02/09/2021 05:37   ECHOCARDIOGRAM COMPLETE  Result Date: 02/17/2021    ECHOCARDIOGRAM REPORT   Patient Name:   RANE BEAUFORD Date of Exam: 02/17/2021 Medical Rec #:  [id number]             Height:       72.0 in Accession #:    [id number]            Weight:       160.5 lb Date of Birth:  09/12/1971  BSA:          1.940 m Patient Age:    49 years              BP:           166/90 mmHg Patient Gender: M                     HR:           92 bpm. Exam Location:  Inpatient Procedure: 2D Echo, Cardiac Doppler and Color  Doppler Indications:    Bacteremia  History:        Patient has no prior history of Echocardiogram examinations.                 Risk Factors:Hypertension.  Sonographer:    Luisa Hart RDCS Referring Phys: [place]  1. The aortic valve is tricuspid. There is mild thickening of the aortic valve. Aortic valve regurgitation is mild to moderate, but in the PLAX and PSAC views, there appear to be both a central and a commissural jets.  2. Left ventricular ejection fraction, by estimation, is 55 to 60%. The left ventricle has normal function. The left ventricle has no regional wall motion abnormalities. The left ventricular internal cavity size was mildly dilated. There is moderate concentric left ventricular hypertrophy. Left ventricular diastolic parameters are consistent with Grade II diastolic dysfunction (pseudonormalization).  3. Right ventricular systolic function is normal. The right ventricular size is mildly enlarged. There is moderately elevated pulmonary artery systolic pressure.  4. Left atrial size was mildly dilated.  5. The mitral valve is abnormal. Mild to moderate mitral valve regurgitation. The mean mitral valve gradient is 5.0 mmHg.  6. Tricuspid valve regurgitation is moderate to severe.  7. Aortic dilatation noted. There is mild dilatation of the ascending aorta, measuring 40 mm. Comparison(s): No prior Echocardiogram. Conclusion(s)/Recommendation(s): Though no vegetation, TEE would be reasonable to consider evaluation of aortic valve, if clinically indicated. FINDINGS  Left Ventricle: Left ventricular ejection fraction, by estimation, is 55 to 60%. The left ventricle has normal function. The left ventricle has no regional wall motion abnormalities. The left ventricular internal cavity size was mildly dilated. There is  moderate concentric left ventricular hypertrophy. Left ventricular diastolic parameters are consistent with Grade II diastolic dysfunction  (pseudonormalization). Right Ventricle: The right ventricular size is mildly enlarged. No increase in right ventricular wall thickness. Right ventricular systolic function is normal. There is moderately elevated pulmonary artery systolic pressure. The tricuspid regurgitant velocity is 3.31 m/s, and with an assumed right atrial pressure of 3 mmHg, the estimated right ventricular systolic pressure is [id number]. Left Atrium: Left atrial size was mildly dilated. Right Atrium: Right atrial size was normal in size. Pericardium: There is no evidence of pericardial effusion. Mitral Valve: The mitral valve is abnormal. There is mild thickening of the mitral valve leaflet(s). Mild to moderate mitral valve regurgitation. MV peak gradient, 10.8 mmHg. The mean mitral valve gradient is 5.0 mmHg with average heart rate of 91 bpm. Tricuspid Valve: The tricuspid valve is normal in structure. Tricuspid valve regurgitation is moderate to severe. No evidence of tricuspid stenosis. Aortic Valve: The aortic valve is tricuspid. There is mild thickening of the aortic valve. Aortic valve regurgitation is mild. Aortic regurgitation PHT measures 351 msec. Aortic valve mean gradient measures 8.5 mmHg. Aortic valve peak gradient measures 15.2 mmHg. Aortic valve area, by VTI measures 3.86 cm. Pulmonic Valve: The pulmonic valve was normal  in structure. Pulmonic valve regurgitation is not visualized. No evidence of pulmonic stenosis. Aorta: Aortic dilatation noted. There is mild dilatation of the ascending aorta, measuring 40 mm. IAS/Shunts: The atrial septum is grossly normal.  LEFT VENTRICLE PLAX 2D LVIDd:         6.00 cm      Diastology LVIDs:         4.70 cm      LV e' medial:    6.85 cm/s LV PW:         1.40 cm      LV E/e' medial:  17.5 LV IVS:        1.30 cm      LV e' lateral:   9.90 cm/s LVOT diam:     2.40 cm      LV E/e' lateral: 12.1 LV SV:         121 LV SV Index:   62 LVOT Area:     4.52 cm  LV Volumes (MOD) LV vol d, MOD A2C:  182.0 ml LV vol d, MOD A4C: 156.0 ml LV vol s, MOD A2C: 85.2 ml LV vol s, MOD A4C: 93.2 ml LV SV MOD A2C:     96.8 ml LV SV MOD A4C:     156.0 ml LV SV MOD BP:      80.9 ml RIGHT VENTRICLE RV Basal diam:  5.10 cm RV Mid diam:    3.50 cm RV S prime:     15.10 cm/s TAPSE (M-mode): 2.9 cm LEFT ATRIUM             Index       RIGHT ATRIUM           Index LA diam:        3.70 cm 1.91 cm/m  RA Area:     16.70 cm LA Vol (A2C):   64.3 ml 33.14 ml/m RA Volume:   43.00 ml  22.16 ml/m LA Vol (A4C):   59.6 ml 30.72 ml/m LA Biplane Vol: 66.1 ml 34.07 ml/m  AORTIC VALVE                    PULMONIC VALVE AV Area (Vmax):    4.04 cm     PV Vmax:       1.42 m/s AV Area (Vmean):   3.94 cm     PV Vmean:      106.000 cm/s AV Area (VTI):     3.86 cm     PV VTI:        0.270 m AV Vmax:           195.00 cm/s  PV Peak grad:  8.1 mmHg AV Vmean:          135.500 cm/s PV Mean grad:  5.0 mmHg AV VTI:            0.314 m AV Peak Grad:      15.2 mmHg AV Mean Grad:      8.5 mmHg LVOT Vmax:         174.00 cm/s LVOT Vmean:        118.000 cm/s LVOT VTI:          0.268 m LVOT/AV VTI ratio: 0.85 AI PHT:            351 msec  AORTA Ao Root diam: 3.50 cm Ao Asc diam:  3.75 cm MITRAL VALVE  TRICUSPID VALVE MV Area (PHT): 5.23 cm      TR Peak grad:   43.8 mmHg MV Area VTI:   3.28 cm      TR Vmax:        331.00 cm/s MV Peak grad:  10.8 mmHg MV Mean grad:  5.0 mmHg      SHUNTS MV Vmax:       1.64 m/s      Systemic VTI:  0.27 m MV Vmean:      105.0 cm/s    Systemic Diam: 2.40 cm MV Decel Time: 145 msec MR Peak grad:    114.9 mmHg MR Mean grad:    77.0 mmHg MR Vmax:         536.00 cm/s MR Vmean:        424.0 cm/s MR PISA:         1.01 cm MR PISA Eff ROA: 6 mm MR PISA Radius:  0.40 cm MV E velocity: 120.00 cm/s MV A velocity: 88.90 cm/s MV E/A ratio:  1.35 Rudean Haskell MD Electronically signed by Rudean Haskell MD Signature Date/Time: 02/17/2021/7:10:14 PM    Final    VAS [place] LOWER EXTREMITY VENOUS (DVT) (ONLY [facility] & [facility]  7a-7p)  Result Date: 02/15/2021  Lower Venous DVT Study Patient Name:  WILLIOM ARROWSMITH  Date of Exam:   02/15/2021 Medical Rec #: [id number]              Accession #:    [id number] Date of Birth: March 28, 1971              Patient Gender: M Patient Age:   22Y Exam Location:  [facility] Procedure:      VAS [place] LOWER EXTREMITY VENOUS (DVT) Referring Phys: [id number] [facility] PFEIFFER --------------------------------------------------------------------------------  Indications: Edema.  Risk Factors: Right lower extremity HD access. Limitations: Poor ultrasound/tissue interface, coughing, HD access and body habitus. Comparison Study: No prior study Performing Technologist: Maudry Mayhew MHA, RDMS, RVT, RDCS  Examination Guidelines: A complete evaluation includes B-mode imaging, spectral Doppler, color Doppler, and power Doppler as needed of all accessible portions of each vessel. Bilateral testing is considered an integral part of a complete examination. Limited examinations for reoccurring indications may be performed as noted. The reflux portion of the exam is performed with the patient in reverse Trendelenburg.  +---------+---------------+---------+-----------+----------+--------------+ RIGHT    CompressibilityPhasicitySpontaneityPropertiesThrombus Aging +---------+---------------+---------+-----------+----------+--------------+ CFV      Full                    Yes                                 +---------+---------------+---------+-----------+----------+--------------+ SFJ      Full                                                        +---------+---------------+---------+-----------+----------+--------------+ FV Prox  Full                                                        +---------+---------------+---------+-----------+----------+--------------+ FV Mid   Full                                                         +---------+---------------+---------+-----------+----------+--------------+  FV DistalFull                                                        +---------+---------------+---------+-----------+----------+--------------+ PFV      Full                                                        +---------+---------------+---------+-----------+----------+--------------+ POP      Full                    Yes                                 +---------+---------------+---------+-----------+----------+--------------+ PTV      Full                                                        +---------+---------------+---------+-----------+----------+--------------+ PERO     Full                                                        +---------+---------------+---------+-----------+----------+--------------+   Left Technical Findings: Not visualized segments include CFV.   Summary: RIGHT: - There is no evidence of deep vein thrombosis in the lower extremity.  - No cystic structure found in the popliteal fossa.   *See table(s) above for measurements and observations. Electronically signed by Monica Martinez MD on 02/15/2021 at 4:46:49 PM.    Final      Subjective: Very eager to go home  Discharge Exam: Vitals:   02/19/21 1510 02/19/21 1519  BP: (!) 150/92 (!) 146/90  Pulse: 85 86  Resp:  20  Temp:    SpO2: 100% 100%   Vitals:   02/19/21 1450 02/19/21 1500 02/19/21 1510 02/19/21 1519  BP: (!) 155/91 (!) 157/93 (!) 150/92 (!) 146/90  Pulse: 94 83 85 86  Resp: 19 (!) 21  20  Temp:      TempSrc:      SpO2: 97% 100% 100% 100%  Weight:      Height:        General: Pt is alert, awake, not in acute distress Cardiovascular: RRR, S1/S2 +, no rubs, no gallops Respiratory: CTA bilaterally, no wheezing, no rhonchi Abdominal: Soft, NT, ND, bowel sounds + Extremities: no edema, no cyanosis   The results of significant diagnostics from this hospitalization (including imaging,  microbiology, ancillary and laboratory) are listed below for reference.     Microbiology: Recent Results (from the past 240 hour(s))  Resp Panel by RT-PCR (Flu A&B, Covid) Nasopharyngeal Swab     Status: None   Collection Time: 02/14/21  8:10 PM   Specimen: Nasopharyngeal Swab; Nasopharyngeal(NP) swabs in vial transport medium  Result Value Ref Range Status   SARS Coronavirus 2 by RT PCR NEGATIVE NEGATIVE  Final    Comment: (NOTE) SARS-CoV-2 target nucleic acids are NOT DETECTED.  The SARS-CoV-2 RNA is generally detectable in upper respiratory specimens during the acute phase of infection. The lowest concentration of SARS-CoV-2 viral copies this assay can detect is 138 copies/mL. A negative result does not preclude SARS-Cov-2 infection and should not be used as the sole basis for treatment or other patient management decisions. A negative result may occur with  improper specimen collection/handling, submission of specimen other than nasopharyngeal swab, presence of viral mutation(s) within the areas targeted by this assay, and inadequate number of viral copies(<138 copies/mL). A negative result must be combined with clinical observations, patient history, and epidemiological information. The expected result is Negative.  Fact Sheet for Patients:  EntrepreneurPulse.com.au  Fact Sheet for Healthcare Providers:  IncredibleEmployment.be  This test is no t yet approved or cleared by the [place] FDA and  has been authorized for detection and/or diagnosis of SARS-CoV-2 by FDA under an Emergency Use Authorization (EUA). This EUA will remain  in effect (meaning this test can be used) for the duration of the COVID-19 declaration under Section 564(b)(1) of the Act, 21 U.S.C.section 360bbb-3(b)(1), unless the authorization is terminated  or revoked sooner.       Influenza A by PCR NEGATIVE NEGATIVE Final   Influenza B by PCR NEGATIVE NEGATIVE  Final    Comment: (NOTE) The Xpert Xpress SARS-CoV-2/FLU/RSV plus assay is intended as an aid in the diagnosis of influenza from Nasopharyngeal swab specimens and should not be used as a sole basis for treatment. Nasal washings and aspirates are unacceptable for Xpert Xpress SARS-CoV-2/FLU/RSV testing.  Fact Sheet for Patients: EntrepreneurPulse.com.au  Fact Sheet for Healthcare Providers: IncredibleEmployment.be  This test is not yet approved or cleared by the [place] FDA and has been authorized for detection and/or diagnosis of SARS-CoV-2 by FDA under an Emergency Use Authorization (EUA). This EUA will remain in effect (meaning this test can be used) for the duration of the COVID-19 declaration under Section 564(b)(1) of the Act, 21 U.S.C. section 360bbb-3(b)(1), unless the authorization is terminated or revoked.  Performed at [facility], [place] [address], [place], [place] [address]   Blood culture (routine x 2)     Status: Abnormal   Collection Time: 02/15/21  4:43 AM   Specimen: BLOOD RIGHT FOREARM  Result Value Ref Range Status   Specimen Description BLOOD RIGHT FOREARM  Final   Special Requests   Final    BOTTLES DRAWN AEROBIC AND ANAEROBIC Blood Culture results may not be optimal due to an inadequate volume of blood received in culture bottles   Culture  Setup Time   Final    GRAM POSITIVE COCCI IN CHAINS IN CLUSTERS IN BOTH AEROBIC AND ANAEROBIC BOTTLES CRITICAL RESULT CALLED TO, READ BACK BY AND VERIFIED WITH: PHARMD LAURA SEAY BY MESSAN H. AT 0012 ON 02/16/2021 Performed at [facility], [place] [address], [place], [place] [address]    Culture STREPTOCOCCUS CRISTATUS (A)  Final   Report Status 02/17/2021 FINAL  Final   Organism ID, Bacteria STREPTOCOCCUS CRISTATUS  Final      Susceptibility   Streptococcus cristatus - MIC*    PENICILLIN <=0.06 SENSITIVE Sensitive     CEFTRIAXONE <=0.12 SENSITIVE Sensitive      ERYTHROMYCIN <=0.12 SENSITIVE Sensitive     LEVOFLOXACIN 1 SENSITIVE Sensitive     VANCOMYCIN 0.5 SENSITIVE Sensitive     * STREPTOCOCCUS CRISTATUS  Blood Culture ID Panel (Reflexed)  Status: Abnormal   Collection Time: 02/15/21  4:43 AM  Result Value Ref Range Status   Enterococcus faecalis NOT DETECTED NOT DETECTED Final   Enterococcus Faecium NOT DETECTED NOT DETECTED Final   Listeria monocytogenes NOT DETECTED NOT DETECTED Final   Staphylococcus species NOT DETECTED NOT DETECTED Final   Staphylococcus aureus (BCID) NOT DETECTED NOT DETECTED Final   Staphylococcus epidermidis NOT DETECTED NOT DETECTED Final   Staphylococcus lugdunensis NOT DETECTED NOT DETECTED Final   Streptococcus species DETECTED (A) NOT DETECTED Final    Comment: Not Enterococcus species, Streptococcus agalactiae, Streptococcus pyogenes, or Streptococcus pneumoniae. CRITICAL RESULT CALLED TO, READ BACK BY AND VERIFIED WITH: PHARMD LAURA SEAY BY MESSAN H. AT 0012 ON 02/16/2021    Streptococcus agalactiae NOT DETECTED NOT DETECTED Final   Streptococcus pneumoniae NOT DETECTED NOT DETECTED Final   Streptococcus pyogenes NOT DETECTED NOT DETECTED Final   A.calcoaceticus-baumannii NOT DETECTED NOT DETECTED Final   Bacteroides fragilis NOT DETECTED NOT DETECTED Final   Enterobacterales NOT DETECTED NOT DETECTED Final   Enterobacter cloacae complex NOT DETECTED NOT DETECTED Final   Escherichia coli NOT DETECTED NOT DETECTED Final   Klebsiella aerogenes NOT DETECTED NOT DETECTED Final   Klebsiella oxytoca NOT DETECTED NOT DETECTED Final   Klebsiella pneumoniae NOT DETECTED NOT DETECTED Final   Proteus species NOT DETECTED NOT DETECTED Final   Salmonella species NOT DETECTED NOT DETECTED Final   Serratia marcescens NOT DETECTED NOT DETECTED Final   Haemophilus influenzae NOT DETECTED NOT DETECTED Final   Neisseria meningitidis NOT DETECTED NOT DETECTED Final   Pseudomonas aeruginosa NOT DETECTED NOT DETECTED Final    Stenotrophomonas maltophilia NOT DETECTED NOT DETECTED Final   Candida albicans NOT DETECTED NOT DETECTED Final   Candida auris NOT DETECTED NOT DETECTED Final   Candida glabrata NOT DETECTED NOT DETECTED Final   Candida krusei NOT DETECTED NOT DETECTED Final   Candida parapsilosis NOT DETECTED NOT DETECTED Final   Candida tropicalis NOT DETECTED NOT DETECTED Final   Cryptococcus neoformans/gattii NOT DETECTED NOT DETECTED Final    Comment: Performed at [facility], [address], [place], [place] [address]  Blood culture (routine x 2)     Status: Abnormal   Collection Time: 02/15/21  8:41 AM   Specimen: BLOOD  Result Value Ref Range Status   Specimen Description BLOOD SITE NOT SPECIFIED  Final   Special Requests   Final    BOTTLES DRAWN AEROBIC AND ANAEROBIC Blood Culture results may not be optimal due to an inadequate volume of blood received in culture bottles   Culture  Setup Time   Final    GRAM POSITIVE COCCI IN CHAINS IN CLUSTERS IN BOTH AEROBIC AND ANAEROBIC BOTTLES CRITICAL RESULT CALLED TO, READ BACK BY AND VERIFIED WITH: CRITICAL VALUE NOTED.  VALUE IS CONSISTENT WITH PREVIOUSLY REPORTED AND CALLED VALUE.    Culture (A)  Final    STREPTOCOCCUS CRISTATUS SUSCEPTIBILITIES PERFORMED ON PREVIOUS CULTURE WITHIN THE LAST 5 DAYS. Performed at [facility], [place] [address], [place], [place] [address]    Report Status 02/17/2021 FINAL  Final  Respiratory (~20 pathogens) panel by PCR     Status: None   Collection Time: 02/15/21  8:41 AM   Specimen: Nasopharyngeal Swab; Respiratory  Result Value Ref Range Status   Adenovirus NOT DETECTED NOT DETECTED Final   Coronavirus 229E NOT DETECTED NOT DETECTED Final    Comment: (NOTE) The Coronavirus on the Respiratory Panel, DOES NOT test for the novel  Coronavirus (  2019 nCoV)    Coronavirus HKU1 NOT DETECTED NOT DETECTED Final   Coronavirus NL63 NOT DETECTED NOT DETECTED Final   Coronavirus OC43 NOT DETECTED NOT  DETECTED Final   Metapneumovirus NOT DETECTED NOT DETECTED Final   Rhinovirus / Enterovirus NOT DETECTED NOT DETECTED Final   Influenza A NOT DETECTED NOT DETECTED Final   Influenza B NOT DETECTED NOT DETECTED Final   Parainfluenza Virus 1 NOT DETECTED NOT DETECTED Final   Parainfluenza Virus 2 NOT DETECTED NOT DETECTED Final   Parainfluenza Virus 3 NOT DETECTED NOT DETECTED Final   Parainfluenza Virus 4 NOT DETECTED NOT DETECTED Final   Respiratory Syncytial Virus NOT DETECTED NOT DETECTED Final   Bordetella pertussis NOT DETECTED NOT DETECTED Final   Bordetella Parapertussis NOT DETECTED NOT DETECTED Final   Chlamydophila pneumoniae NOT DETECTED NOT DETECTED Final   Mycoplasma pneumoniae NOT DETECTED NOT DETECTED Final    Comment: Performed at [facility], [place] [address] [place], [place] [address]  Culture, blood (Routine X 2) w Reflex to ID Panel     Status: None (Preliminary result)   Collection Time: 02/17/21  5:12 AM   Specimen: BLOOD  Result Value Ref Range Status   Specimen Description BLOOD RIGHT ANTECUBITAL  Final   Special Requests   Final    BOTTLES DRAWN AEROBIC AND ANAEROBIC Blood Culture results may not be optimal due to an excessive volume of blood received in culture bottles   Culture   Final    NO GROWTH 2 DAYS Performed at [facility], [place] [address] [place], [place] [address]    Report Status PENDING  Incomplete  Culture, blood (Routine X 2) w Reflex to ID Panel     Status: None (Preliminary result)   Collection Time: 02/17/21  5:12 AM   Specimen: BLOOD RIGHT HAND  Result Value Ref Range Status   Specimen Description BLOOD RIGHT HAND  Final   Special Requests   Final    BOTTLES DRAWN AEROBIC AND ANAEROBIC Blood Culture adequate volume   Culture   Final    NO GROWTH 2 DAYS Performed at [facility], [place] [address], [place], [place] [address]    Report Status PENDING  Incomplete     Labs: BNP (last 3 results) No results for input(s):  BNP in the last 8760 hours. Basic Metabolic Panel: Recent Labs  Lab 02/14/21 2022 02/16/21 0333 02/17/21 0512 02/19/21 0841  NA 138 134* 135 135  K 4.9 4.6 3.7 4.4  CL 95* 95* 97* 98  CO2 '25 22 26 25  '$ GLUCOSE 84 102* 94 85  BUN 59* 85* 44* 38*  CREATININE 13.73* 17.01* 11.66* 12.51*  CALCIUM 7.3* 6.7* 7.4* 7.4*  PHOS  --  2.8 2.5 2.3*   Liver Function Tests: Recent Labs  Lab 02/14/21 2022 02/17/21 0512 02/19/21 0841  AST 15  --   --   ALT 16  --   --   ALKPHOS 94  --   --   BILITOT 1.4*  --   --   PROT 6.6  --   --   ALBUMIN 3.0* 2.4* 2.6*   No results for input(s): LIPASE, AMYLASE in the last 168 hours. No results for input(s): AMMONIA in the last 168 hours. CBC: Recent Labs  Lab 02/14/21 2022 02/16/21 0333 02/17/21 0830 02/19/21 0840  WBC 8.3 6.8 4.7 7.6  NEUTROABS 7.0  --   --   --   HGB 10.0* 9.7* 10.2* 10.2*  HCT 31.7* 29.6* 31.3* 31.7*  MCV 94.3 90.0 90.5 91.1  PLT 265 252 303 381   Cardiac Enzymes: No results for input(s): CKTOTAL, CKMB, CKMBINDEX, TROPONINI in the last 168 hours. BNP: Invalid input(s): POCBNP CBG: No results for input(s): GLUCAP in the last 168 hours. D-Dimer No results for input(s): DDIMER in the last 72 hours. Hgb A1c No results for input(s): HGBA1C in the last 72 hours. Lipid Profile No results for input(s): CHOL, HDL, LDLCALC, TRIG, CHOLHDL, LDLDIRECT in the last 72 hours. Thyroid function studies No results for input(s): TSH, T4TOTAL, T3FREE, THYROIDAB in the last 72 hours.  Invalid input(s): FREET3 Anemia work up No results for input(s): VITAMINB12, FOLATE, FERRITIN, TIBC, IRON, RETICCTPCT in the last 72 hours. Urinalysis    Component Value Date/Time   COLORURINE YELLOW 08/01/2009 0742   APPEARANCEUR TURBID (A) 08/01/2009 0742   LABSPEC 1.018 08/01/2009 0742   PHURINE 5.5 08/01/2009 0742   GLUCOSEU NEGATIVE 08/01/2009 0742   HGBUR LARGE (A) 08/01/2009 0742   BILIRUBINUR SMALL (A) 08/01/2009 0742   KETONESUR 15  (A) 08/01/2009 0742   PROTEINUR 100 (A) 08/01/2009 0742   UROBILINOGEN 1.0 08/01/2009 0742   NITRITE NEGATIVE 08/01/2009 0742   LEUKOCYTESUR LARGE (A) 08/01/2009 0742   Sepsis Labs Invalid input(s): PROCALCITONIN,  WBC,  LACTICIDVEN Microbiology Recent Results (from the past 240 hour(s))  Resp Panel by RT-PCR (Flu A&B, Covid) Nasopharyngeal Swab     Status: None   Collection Time: 02/14/21  8:10 PM   Specimen: Nasopharyngeal Swab; Nasopharyngeal(NP) swabs in vial transport medium  Result Value Ref Range Status   SARS Coronavirus 2 by RT PCR NEGATIVE NEGATIVE Final    Comment: (NOTE) SARS-CoV-2 target nucleic acids are NOT DETECTED.  The SARS-CoV-2 RNA is generally detectable in upper respiratory specimens during the acute phase of infection. The lowest concentration of SARS-CoV-2 viral copies this assay can detect is 138 copies/mL. A negative result does not preclude SARS-Cov-2 infection and should not be used as the sole basis for treatment or other patient management decisions. A negative result may occur with  improper specimen collection/handling, submission of specimen other than nasopharyngeal swab, presence of viral mutation(s) within the areas targeted by this assay, and inadequate number of viral copies(<138 copies/mL). A negative result must be combined with clinical observations, patient history, and epidemiological information. The expected result is Negative.  Fact Sheet for Patients:  EntrepreneurPulse.com.au  Fact Sheet for Healthcare Providers:  IncredibleEmployment.be  This test is no t yet approved or cleared by the [place] FDA and  has been authorized for detection and/or diagnosis of SARS-CoV-2 by FDA under an Emergency Use Authorization (EUA). This EUA will remain  in effect (meaning this test can be used) for the duration of the COVID-19 declaration under Section 564(b)(1) of the Act, 21 U.S.C.section  360bbb-3(b)(1), unless the authorization is terminated  or revoked sooner.       Influenza A by PCR NEGATIVE NEGATIVE Final   Influenza B by PCR NEGATIVE NEGATIVE Final    Comment: (NOTE) The Xpert Xpress SARS-CoV-2/FLU/RSV plus assay is intended as an aid in the diagnosis of influenza from Nasopharyngeal swab specimens and should not be used as a sole basis for treatment. Nasal washings and aspirates are unacceptable for Xpert Xpress SARS-CoV-2/FLU/RSV testing.  Fact Sheet for Patients: EntrepreneurPulse.com.au  Fact Sheet for Healthcare Providers: IncredibleEmployment.be  This test is not yet approved or cleared by the [place] FDA and has been authorized for detection and/or diagnosis of SARS-CoV-2 by  FDA under an Emergency Use Authorization (EUA). This EUA will remain in effect (meaning this test can be used) for the duration of the COVID-19 declaration under Section 564(b)(1) of the Act, 21 U.S.C. section 360bbb-3(b)(1), unless the authorization is terminated or revoked.  Performed at [facility], [place] [address], [place], [place] [address]   Blood culture (routine x 2)     Status: Abnormal   Collection Time: 02/15/21  4:43 AM   Specimen: BLOOD RIGHT FOREARM  Result Value Ref Range Status   Specimen Description BLOOD RIGHT FOREARM  Final   Special Requests   Final    BOTTLES DRAWN AEROBIC AND ANAEROBIC Blood Culture results may not be optimal due to an inadequate volume of blood received in culture bottles   Culture  Setup Time   Final    GRAM POSITIVE COCCI IN CHAINS IN CLUSTERS IN BOTH AEROBIC AND ANAEROBIC BOTTLES CRITICAL RESULT CALLED TO, READ BACK BY AND VERIFIED WITH: PHARMD LAURA SEAY BY MESSAN H. AT 0012 ON 02/16/2021 Performed at [facility], [place] [address], [place], [place] [address]    Culture STREPTOCOCCUS CRISTATUS (A)  Final   Report Status 02/17/2021 FINAL  Final   Organism ID, Bacteria  STREPTOCOCCUS CRISTATUS  Final      Susceptibility   Streptococcus cristatus - MIC*    PENICILLIN <=0.06 SENSITIVE Sensitive     CEFTRIAXONE <=0.12 SENSITIVE Sensitive     ERYTHROMYCIN <=0.12 SENSITIVE Sensitive     LEVOFLOXACIN 1 SENSITIVE Sensitive     VANCOMYCIN 0.5 SENSITIVE Sensitive     * STREPTOCOCCUS CRISTATUS  Blood Culture ID Panel (Reflexed)     Status: Abnormal   Collection Time: 02/15/21  4:43 AM  Result Value Ref Range Status   Enterococcus faecalis NOT DETECTED NOT DETECTED Final   Enterococcus Faecium NOT DETECTED NOT DETECTED Final   Listeria monocytogenes NOT DETECTED NOT DETECTED Final   Staphylococcus species NOT DETECTED NOT DETECTED Final   Staphylococcus aureus (BCID) NOT DETECTED NOT DETECTED Final   Staphylococcus epidermidis NOT DETECTED NOT DETECTED Final   Staphylococcus lugdunensis NOT DETECTED NOT DETECTED Final   Streptococcus species DETECTED (A) NOT DETECTED Final    Comment: Not Enterococcus species, Streptococcus agalactiae, Streptococcus pyogenes, or Streptococcus pneumoniae. CRITICAL RESULT CALLED TO, READ BACK BY AND VERIFIED WITH: PHARMD LAURA SEAY BY MESSAN H. AT 0012 ON 02/16/2021    Streptococcus agalactiae NOT DETECTED NOT DETECTED Final   Streptococcus pneumoniae NOT DETECTED NOT DETECTED Final   Streptococcus pyogenes NOT DETECTED NOT DETECTED Final   A.calcoaceticus-baumannii NOT DETECTED NOT DETECTED Final   Bacteroides fragilis NOT DETECTED NOT DETECTED Final   Enterobacterales NOT DETECTED NOT DETECTED Final   Enterobacter cloacae complex NOT DETECTED NOT DETECTED Final   Escherichia coli NOT DETECTED NOT DETECTED Final   Klebsiella aerogenes NOT DETECTED NOT DETECTED Final   Klebsiella oxytoca NOT DETECTED NOT DETECTED Final   Klebsiella pneumoniae NOT DETECTED NOT DETECTED Final   Proteus species NOT DETECTED NOT DETECTED Final   Salmonella species NOT DETECTED NOT DETECTED Final   Serratia marcescens NOT DETECTED NOT DETECTED  Final   Haemophilus influenzae NOT DETECTED NOT DETECTED Final   Neisseria meningitidis NOT DETECTED NOT DETECTED Final   Pseudomonas aeruginosa NOT DETECTED NOT DETECTED Final   Stenotrophomonas maltophilia NOT DETECTED NOT DETECTED Final   Candida albicans NOT DETECTED NOT DETECTED Final   Candida auris NOT DETECTED NOT DETECTED Final   Candida glabrata NOT DETECTED NOT DETECTED Final  Candida krusei NOT DETECTED NOT DETECTED Final   Candida parapsilosis NOT DETECTED NOT DETECTED Final   Candida tropicalis NOT DETECTED NOT DETECTED Final   Cryptococcus neoformans/gattii NOT DETECTED NOT DETECTED Final    Comment: Performed at [facility], [place] [address] [place], [place] [address]  Blood culture (routine x 2)     Status: Abnormal   Collection Time: 02/15/21  8:41 AM   Specimen: BLOOD  Result Value Ref Range Status   Specimen Description BLOOD SITE NOT SPECIFIED  Final   Special Requests   Final    BOTTLES DRAWN AEROBIC AND ANAEROBIC Blood Culture results may not be optimal due to an inadequate volume of blood received in culture bottles   Culture  Setup Time   Final    GRAM POSITIVE COCCI IN CHAINS IN CLUSTERS IN BOTH AEROBIC AND ANAEROBIC BOTTLES CRITICAL RESULT CALLED TO, READ BACK BY AND VERIFIED WITH: CRITICAL VALUE NOTED.  VALUE IS CONSISTENT WITH PREVIOUSLY REPORTED AND CALLED VALUE.    Culture (A)  Final    STREPTOCOCCUS CRISTATUS SUSCEPTIBILITIES PERFORMED ON PREVIOUS CULTURE WITHIN THE LAST 5 DAYS. Performed at [facility], [place] [address] [place], [place] [address]    Report Status 02/17/2021 FINAL  Final  Respiratory (~20 pathogens) panel by PCR     Status: None   Collection Time: 02/15/21  8:41 AM   Specimen: Nasopharyngeal Swab; Respiratory  Result Value Ref Range Status   Adenovirus NOT DETECTED NOT DETECTED Final   Coronavirus 229E NOT DETECTED NOT DETECTED Final    Comment: (NOTE) The Coronavirus on the Respiratory Panel, DOES NOT test for the  novel  Coronavirus (2019 nCoV)    Coronavirus HKU1 NOT DETECTED NOT DETECTED Final   Coronavirus NL63 NOT DETECTED NOT DETECTED Final   Coronavirus OC43 NOT DETECTED NOT DETECTED Final   Metapneumovirus NOT DETECTED NOT DETECTED Final   Rhinovirus / Enterovirus NOT DETECTED NOT DETECTED Final   Influenza A NOT DETECTED NOT DETECTED Final   Influenza B NOT DETECTED NOT DETECTED Final   Parainfluenza Virus 1 NOT DETECTED NOT DETECTED Final   Parainfluenza Virus 2 NOT DETECTED NOT DETECTED Final   Parainfluenza Virus 3 NOT DETECTED NOT DETECTED Final   Parainfluenza Virus 4 NOT DETECTED NOT DETECTED Final   Respiratory Syncytial Virus NOT DETECTED NOT DETECTED Final   Bordetella pertussis NOT DETECTED NOT DETECTED Final   Bordetella Parapertussis NOT DETECTED NOT DETECTED Final   Chlamydophila pneumoniae NOT DETECTED NOT DETECTED Final   Mycoplasma pneumoniae NOT DETECTED NOT DETECTED Final    Comment: Performed at [facility], [place] [address], [place], [place] [address]  Culture, blood (Routine X 2) w Reflex to ID Panel     Status: None (Preliminary result)   Collection Time: 02/17/21  5:12 AM   Specimen: BLOOD  Result Value Ref Range Status   Specimen Description BLOOD RIGHT ANTECUBITAL  Final   Special Requests   Final    BOTTLES DRAWN AEROBIC AND ANAEROBIC Blood Culture results may not be optimal due to an excessive volume of blood received in culture bottles   Culture   Final    NO GROWTH 2 DAYS Performed at [facility], [place] [address] [place], [place] [address]    Report Status PENDING  Incomplete  Culture, blood (Routine X 2) w Reflex to ID Panel     Status: None (Preliminary result)   Collection Time: 02/17/21  5:12 AM   Specimen: BLOOD RIGHT  HAND  Result Value Ref Range Status   Specimen Description BLOOD RIGHT HAND  Final   Special Requests   Final    BOTTLES DRAWN AEROBIC AND ANAEROBIC Blood Culture adequate volume   Culture   Final    NO GROWTH 2  DAYS Performed at [facility], [address], [name], [place] [address]    Report Status PENDING  Incomplete   Time spent: 30 min  SIGNED:   Marylu Lund, MD  [facility] 02/19/2021, 3:39 PM  If 7PM-7AM, please contact night-coverage

## 2021-02-19 NOTE — Progress Notes (Signed)
CSW spoke with renal navigator Terri Piedra about need for IV abx during HD.  Jaclyn Shaggy will communicate with PA to have the orders send to HD clinic. Lurline Idol, MSW, LCSW 5/20/20223:31 PM

## 2021-02-19 NOTE — CV Procedure (Signed)
TRANSESOPHAGEAL ECHOCARDIOGRAM (TEE) NOTE  INDICATIONS: infective endocarditis  PROCEDURE:   Informed consent was obtained prior to the procedure. The risks, benefits and alternatives for the procedure were discussed and the patient comprehended these risks.  Risks include, but are not limited to, cough, sore throat, vomiting, nausea, somnolence, esophageal and stomach trauma or perforation, bleeding, low blood pressure, aspiration, pneumonia, infection, trauma to the teeth and death.    After a procedural time-out, the patient was given propofol per anesthesia for sedation.  The patient's heart rate, blood pressure, and oxygen saturation are monitored continuously during the procedure.The oropharynx was anesthetized with topical cetacaine.  The transesophageal probe was inserted in the esophagus and stomach without difficulty and multiple views were obtained.  The patient was kept under observation until the patient left the procedure room. I was present face-to-face 100% of this time. The patient left the procedure room in stable condition.   Agitated microbubble saline contrast was not administered.  COMPLICATIONS:    There were no immediate complications.  Findings:  1. LEFT VENTRICLE: The left ventricular wall thickness is moderately increased.  The left ventricular cavity is normal in size. Wall motion is normal.  LVEF is 50-55%.  2. RIGHT VENTRICLE:  The right ventricle is mildly dilated with normal systolic function without any thrombus or masses.    3. LEFT ATRIUM:  The left atrium is mildly dilated in size without any thrombus or masses.  There is not spontaneous echo contrast ("smoke") in the left atrium consistent with a low flow state.  4. LEFT ATRIAL APPENDAGE:  The large left atrial appendage is free of any thrombus or masses. The appendage has single lobes. Pulse doppler indicates moderate flow in the appendage.  5. ATRIAL SEPTUM:  The atrial septum appears intact and  is free of thrombus and/or masses.  There is no evidence for interatrial shunting by color doppler and saline microbubble.  6. RIGHT ATRIUM:  The right atrium is normal in size and function without any thrombus or masses.  7. MITRAL VALVE:  The mitral valve is abnormal in structure and function with mild to moderate posteriorly directed regurgitation regurgitation. There appears to be mild late systolic prolapse of the anterior leaflet. There were no vegetations or stenosis.  8. AORTIC VALVE:  The aortic valve is trileaflet, normal in structure and function with trivial central regurgitation.  There were no vegetations or stenosis  9. TRICUSPID VALVE:  The tricuspid valve is normal in structure and function with Moderate regurgitation.  There were no vegetations or stenosis  10.  PULMONIC VALVE:  The pulmonic valve is normal in structure and function with trivial regurgitation.  There were no vegetations or stenosis.   11. AORTIC ARCH, ASCENDING AND DESCENDING AORTA:  There was grade 1 Ron Parker et. Al, 1992) atherosclerosis of the ascending aorta, aortic arch, or proximal descending aorta.   12. PULMONARY VEINS: Anomalous pulmonary venous return was not noted.  13. PERICARDIUM: The pericardium appeared normal and non-thickened.  There is no pericardial effusion.  IMPRESSION:   1. No obvious valvular vegetations 2. No LAA thrombus 3. Negative for PFO by color doppler 4. 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 Moderate TR 6. Trivial central AI 7. Mild LAE 8. Moderate LVH, mildly dilated RV 9. LVEF 50-55%  RECOMMENDATIONS:     1.  Management of bacteremia per ID recommendations.  Time Spent Directly with the Patient:  45 minutes   [name], MD,  FACC, [place] Director of the [facility] Diplomate of the American Board of Clinical Lipidology Attending  Cardiologist  Direct Dial: [phone]  Fax: [phone]  Website:  [email or web address]  Nadean Corwin Sruthi Maurer 02/19/2021, 2:40 PM

## 2021-02-19 NOTE — Anesthesia Procedure Notes (Signed)
Procedure Name: MAC Date/Time: 02/19/2021 2:10 PM Performed by: Mariea Clonts, CRNA Pre-anesthesia Checklist: Patient identified, Emergency Drugs available, Suction available, Patient being monitored and Timeout performed Patient Re-evaluated:Patient Re-evaluated prior to induction Oxygen Delivery Method: Simple face mask and Nasal cannula Preoxygenation: Pre-oxygenation with 100% oxygen

## 2021-02-19 NOTE — Anesthesia Preprocedure Evaluation (Signed)
Anesthesia Evaluation  Patient identified by MRN, date of birth, ID band Patient awake    Reviewed: Allergy & Precautions, H&P , NPO status , Patient's Chart, lab work & pertinent test results  Airway Mallampati: II   Neck ROM: full    Dental   Pulmonary former smoker,    breath sounds clear to auscultation       Cardiovascular hypertension,  Rhythm:regular Rate:Normal  bacteremia   Neuro/Psych  Headaches, PSYCHIATRIC DISORDERS Anxiety Depression  Neuromuscular disease    GI/Hepatic   Endo/Other    Renal/GU ESRF and DialysisRenal disease     Musculoskeletal   Abdominal   Peds  Hematology  (+) Blood dyscrasia, anemia ,   Anesthesia Other Findings   Reproductive/Obstetrics                             Anesthesia Physical Anesthesia Plan  ASA: III  Anesthesia Plan: MAC   Post-op Pain Management:    Induction: Intravenous  PONV Risk Score and Plan: 1 and Propofol infusion and Treatment may vary due to age or medical condition  Airway Management Planned: Nasal Cannula  Additional Equipment:   Intra-op Plan:   Post-operative Plan:   Informed Consent: I have reviewed the patients History and Physical, chart, labs and discussed the procedure including the risks, benefits and alternatives for the proposed anesthesia with the patient or authorized representative who has indicated his/her understanding and acceptance.     Dental advisory given  Plan Discussed with: CRNA, Anesthesiologist and Surgeon  Anesthesia Plan Comments:         Anesthesia Quick Evaluation

## 2021-02-19 NOTE — Progress Notes (Signed)
[facility] ROUNDING NOTE   Subjective:   Brief history: 50 year old gentleman end-stage renal disease Monday Wednesday Friday dialysis [facility], hypertension hypothyroidism previous TDC infection with bacteremia presented to the emergency room 02/14/2021 with fever cough and infiltrates.  Blood cultures positive for strep species.  TTE   reviewed no evidence of vegetation.  TEE recommended.  IR consulted for left renal lesion that is not amenable due to location.  Blood pressure 142/83 pulse 88 temperature 98 O2 sats 97% room air  Sodium 135 potassium 3.7 chloride 97 CO2 26 BUN 44 creatinine 11.6 glucose 94 calcium 7.4 phosphorus 2.5 albumin 2.4 hemoglobin 10.2  Last dialysis 02/17/2021 1.8 L removed.  Next dialysis to be 02/19/2021  Appreciate assistance of infectious disease.  Blood cultures 02/15/2021 positive for strep cristatus.  Currently on Ancef with overall improving symptoms.  TTE inconclusive for infective endocarditis with a TEE that is pending.  History of hep B surface antigen positive  Objective:  Vital signs in last 24 hours:  Temp:  [98 F (36.7 C)-98.9 F (37.2 C)] 98 F (36.7 C) (05/20 0400) Pulse Rate:  [84-94] 84 (05/20 0400) Resp:  [12-20] 12 (05/20 0400) BP: (139-152)/(80-93) 142/83 (05/20 0400) SpO2:  [94 %-100 %] 100 % (05/20 0400)  Weight change:  Filed Weights   02/16/21 1034 02/17/21 0755 02/17/21 1118  Weight: 73.4 kg 73.7 kg 72.8 kg    Intake/Output: I/O last 3 completed shifts: In: 104.4 [IV Piggyback:104.4] Out: 1800 [Other:1800]   Intake/Output this shift:  No intake/output data recorded.  General:well appearing male in NAD Heart:RRR, no mrg Lungs:CTAB, nml WOB Abdomen:soft, NTND Extremities:no LE edema Dialysis Access: R thigh AVG +b/t    Basic Metabolic Panel: Recent Labs  Lab 02/14/21 2022 02/16/21 0333 02/17/21 0512  NA 138 134* 135  K 4.9 4.6 3.7  CL 95* 95* 97*  CO2 '25 22 26  '$ GLUCOSE  84 102* 94  BUN 59* 85* 44*  CREATININE 13.73* 17.01* 11.66*  CALCIUM 7.3* 6.7* 7.4*  PHOS  --  2.8 2.5    Liver Function Tests: Recent Labs  Lab 02/14/21 2022 02/17/21 0512  AST 15  --   ALT 16  --   ALKPHOS 94  --   BILITOT 1.4*  --   PROT 6.6  --   ALBUMIN 3.0* 2.4*   No results for input(s): LIPASE, AMYLASE in the last 168 hours. No results for input(s): AMMONIA in the last 168 hours.  CBC: Recent Labs  Lab 02/14/21 2022 02/16/21 0333 02/17/21 0830  WBC 8.3 6.8 4.7  NEUTROABS 7.0  --   --   HGB 10.0* 9.7* 10.2*  HCT 31.7* 29.6* 31.3*  MCV 94.3 90.0 90.5  PLT 265 252 303    Cardiac Enzymes: No results for input(s): CKTOTAL, CKMB, CKMBINDEX, TROPONINI in the last 168 hours.  BNP: Invalid input(s): POCBNP  CBG: No results for input(s): GLUCAP in the last 168 hours.  Microbiology: Results for orders placed or performed during the hospital encounter of 02/14/21  Resp Panel by RT-PCR (Flu A&B, Covid) Nasopharyngeal Swab     Status: None   Collection Time: 02/14/21  8:10 PM   Specimen: Nasopharyngeal Swab; Nasopharyngeal(NP) swabs in vial transport medium  Result Value Ref Range Status   SARS Coronavirus 2 by RT PCR NEGATIVE NEGATIVE Final    Comment: (NOTE) SARS-CoV-2 target nucleic acids are NOT DETECTED.  The SARS-CoV-2 RNA is generally detectable in upper respiratory specimens during the acute phase of  infection. The lowest concentration of SARS-CoV-2 viral copies this assay can detect is 138 copies/mL. A negative result does not preclude SARS-Cov-2 infection and should not be used as the sole basis for treatment or other patient management decisions. A negative result may occur with  improper specimen collection/handling, submission of specimen other than nasopharyngeal swab, presence of viral mutation(s) within the areas targeted by this assay, and inadequate number of viral copies(<138 copies/mL). A negative result must be combined with clinical  observations, patient history, and epidemiological information. The expected result is Negative.  Fact Sheet for Patients:  EntrepreneurPulse.com.au  Fact Sheet for Healthcare Providers:  IncredibleEmployment.be  This test is no t yet approved or cleared by the [place] FDA and  has been authorized for detection and/or diagnosis of SARS-CoV-2 by FDA under an Emergency Use Authorization (EUA). This EUA will remain  in effect (meaning this test can be used) for the duration of the COVID-19 declaration under Section 564(b)(1) of the Act, 21 U.S.C.section 360bbb-3(b)(1), unless the authorization is terminated  or revoked sooner.       Influenza A by PCR NEGATIVE NEGATIVE Final   Influenza B by PCR NEGATIVE NEGATIVE Final    Comment: (NOTE) The Xpert Xpress SARS-CoV-2/FLU/RSV plus assay is intended as an aid in the diagnosis of influenza from Nasopharyngeal swab specimens and should not be used as a sole basis for treatment. Nasal washings and aspirates are unacceptable for Xpert Xpress SARS-CoV-2/FLU/RSV testing.  Fact Sheet for Patients: EntrepreneurPulse.com.au  Fact Sheet for Healthcare Providers: IncredibleEmployment.be  This test is not yet approved or cleared by the [place] FDA and has been authorized for detection and/or diagnosis of SARS-CoV-2 by FDA under an Emergency Use Authorization (EUA). This EUA will remain in effect (meaning this test can be used) for the duration of the COVID-19 declaration under Section 564(b)(1) of the Act, 21 U.S.C. section 360bbb-3(b)(1), unless the authorization is terminated or revoked.  Performed at [facility], [place] [address], [place], [place] [address]   Blood culture (routine x 2)     Status: Abnormal   Collection Time: 02/15/21  4:43 AM   Specimen: BLOOD RIGHT FOREARM  Result Value Ref Range Status   Specimen Description BLOOD RIGHT FOREARM   Final   Special Requests   Final    BOTTLES DRAWN AEROBIC AND ANAEROBIC Blood Culture results may not be optimal due to an inadequate volume of blood received in culture bottles   Culture  Setup Time   Final    GRAM POSITIVE COCCI IN CHAINS IN CLUSTERS IN BOTH AEROBIC AND ANAEROBIC BOTTLES CRITICAL RESULT CALLED TO, READ BACK BY AND VERIFIED WITH: PHARMD LAURA SEAY BY MESSAN H. AT 0012 ON 02/16/2021 Performed at [facility], [place] [address], [place], [place] [address]    Culture STREPTOCOCCUS CRISTATUS (A)  Final   Report Status 02/17/2021 FINAL  Final   Organism ID, Bacteria STREPTOCOCCUS CRISTATUS  Final      Susceptibility   Streptococcus cristatus - MIC*    PENICILLIN <=0.06 SENSITIVE Sensitive     CEFTRIAXONE <=0.12 SENSITIVE Sensitive     ERYTHROMYCIN <=0.12 SENSITIVE Sensitive     LEVOFLOXACIN 1 SENSITIVE Sensitive     VANCOMYCIN 0.5 SENSITIVE Sensitive     * STREPTOCOCCUS CRISTATUS  Blood Culture ID Panel (Reflexed)     Status: Abnormal   Collection Time: 02/15/21  4:43 AM  Result Value Ref Range Status   Enterococcus faecalis NOT DETECTED NOT DETECTED Final  Enterococcus Faecium NOT DETECTED NOT DETECTED Final   Listeria monocytogenes NOT DETECTED NOT DETECTED Final   Staphylococcus species NOT DETECTED NOT DETECTED Final   Staphylococcus aureus (BCID) NOT DETECTED NOT DETECTED Final   Staphylococcus epidermidis NOT DETECTED NOT DETECTED Final   Staphylococcus lugdunensis NOT DETECTED NOT DETECTED Final   Streptococcus species DETECTED (A) NOT DETECTED Final    Comment: Not Enterococcus species, Streptococcus agalactiae, Streptococcus pyogenes, or Streptococcus pneumoniae. CRITICAL RESULT CALLED TO, READ BACK BY AND VERIFIED WITH: PHARMD LAURA SEAY BY MESSAN H. AT 0012 ON 02/16/2021    Streptococcus agalactiae NOT DETECTED NOT DETECTED Final   Streptococcus pneumoniae NOT DETECTED NOT DETECTED Final   Streptococcus pyogenes NOT DETECTED NOT DETECTED Final    A.calcoaceticus-baumannii NOT DETECTED NOT DETECTED Final   Bacteroides fragilis NOT DETECTED NOT DETECTED Final   Enterobacterales NOT DETECTED NOT DETECTED Final   Enterobacter cloacae complex NOT DETECTED NOT DETECTED Final   Escherichia coli NOT DETECTED NOT DETECTED Final   Klebsiella aerogenes NOT DETECTED NOT DETECTED Final   Klebsiella oxytoca NOT DETECTED NOT DETECTED Final   Klebsiella pneumoniae NOT DETECTED NOT DETECTED Final   Proteus species NOT DETECTED NOT DETECTED Final   Salmonella species NOT DETECTED NOT DETECTED Final   Serratia marcescens NOT DETECTED NOT DETECTED Final   Haemophilus influenzae NOT DETECTED NOT DETECTED Final   Neisseria meningitidis NOT DETECTED NOT DETECTED Final   Pseudomonas aeruginosa NOT DETECTED NOT DETECTED Final   Stenotrophomonas maltophilia NOT DETECTED NOT DETECTED Final   Candida albicans NOT DETECTED NOT DETECTED Final   Candida auris NOT DETECTED NOT DETECTED Final   Candida glabrata NOT DETECTED NOT DETECTED Final   Candida krusei NOT DETECTED NOT DETECTED Final   Candida parapsilosis NOT DETECTED NOT DETECTED Final   Candida tropicalis NOT DETECTED NOT DETECTED Final   Cryptococcus neoformans/gattii NOT DETECTED NOT DETECTED Final    Comment: Performed at [facility], [address], [place], [place] [address]  Blood culture (routine x 2)     Status: Abnormal   Collection Time: 02/15/21  8:41 AM   Specimen: BLOOD  Result Value Ref Range Status   Specimen Description BLOOD SITE NOT SPECIFIED  Final   Special Requests   Final    BOTTLES DRAWN AEROBIC AND ANAEROBIC Blood Culture results may not be optimal due to an inadequate volume of blood received in culture bottles   Culture  Setup Time   Final    GRAM POSITIVE COCCI IN CHAINS IN CLUSTERS IN BOTH AEROBIC AND ANAEROBIC BOTTLES CRITICAL RESULT CALLED TO, READ BACK BY AND VERIFIED WITH: CRITICAL VALUE NOTED.  VALUE IS CONSISTENT WITH PREVIOUSLY REPORTED AND CALLED VALUE.     Culture (A)  Final    STREPTOCOCCUS CRISTATUS SUSCEPTIBILITIES PERFORMED ON PREVIOUS CULTURE WITHIN THE LAST 5 DAYS. Performed at [facility], [place] [address], [place], [place] [address]    Report Status 02/17/2021 FINAL  Final  Respiratory (~20 pathogens) panel by PCR     Status: None   Collection Time: 02/15/21  8:41 AM   Specimen: Nasopharyngeal Swab; Respiratory  Result Value Ref Range Status   Adenovirus NOT DETECTED NOT DETECTED Final   Coronavirus 229E NOT DETECTED NOT DETECTED Final    Comment: (NOTE) The Coronavirus on the Respiratory Panel, DOES NOT test for the novel  Coronavirus (2019 nCoV)    Coronavirus HKU1 NOT DETECTED NOT DETECTED Final   Coronavirus NL63 NOT DETECTED NOT DETECTED Final   Coronavirus OC43 NOT  DETECTED NOT DETECTED Final   Metapneumovirus NOT DETECTED NOT DETECTED Final   Rhinovirus / Enterovirus NOT DETECTED NOT DETECTED Final   Influenza A NOT DETECTED NOT DETECTED Final   Influenza B NOT DETECTED NOT DETECTED Final   Parainfluenza Virus 1 NOT DETECTED NOT DETECTED Final   Parainfluenza Virus 2 NOT DETECTED NOT DETECTED Final   Parainfluenza Virus 3 NOT DETECTED NOT DETECTED Final   Parainfluenza Virus 4 NOT DETECTED NOT DETECTED Final   Respiratory Syncytial Virus NOT DETECTED NOT DETECTED Final   Bordetella pertussis NOT DETECTED NOT DETECTED Final   Bordetella Parapertussis NOT DETECTED NOT DETECTED Final   Chlamydophila pneumoniae NOT DETECTED NOT DETECTED Final   Mycoplasma pneumoniae NOT DETECTED NOT DETECTED Final    Comment: Performed at [facility], [place] [address], [place], [place] [address]  Culture, blood (Routine X 2) w Reflex to ID Panel     Status: None (Preliminary result)   Collection Time: 02/17/21  5:12 AM   Specimen: BLOOD  Result Value Ref Range Status   Specimen Description BLOOD RIGHT ANTECUBITAL  Final   Special Requests   Final    BOTTLES DRAWN AEROBIC AND ANAEROBIC Blood Culture results may not be  optimal due to an excessive volume of blood received in culture bottles   Culture   Final    NO GROWTH 1 DAY Performed at [facility], [place] [address], [place], [place] [address]    Report Status PENDING  Incomplete  Culture, blood (Routine X 2) w Reflex to ID Panel     Status: None (Preliminary result)   Collection Time: 02/17/21  5:12 AM   Specimen: BLOOD RIGHT HAND  Result Value Ref Range Status   Specimen Description BLOOD RIGHT HAND  Final   Special Requests   Final    BOTTLES DRAWN AEROBIC AND ANAEROBIC Blood Culture adequate volume   Culture   Final    NO GROWTH 1 DAY Performed at [facility], [place] [address], [place], [place] [address]    Report Status PENDING  Incomplete    Coagulation Studies: No results for input(s): LABPROT, INR in the last 72 hours.  Urinalysis: No results for input(s): COLORURINE, LABSPEC, PHURINE, GLUCOSEU, HGBUR, BILIRUBINUR, KETONESUR, PROTEINUR, UROBILINOGEN, NITRITE, LEUKOCYTESUR in the last 72 hours.  Invalid input(s): APPERANCEUR    Imaging: DG Orthopantogram  Result Date: 02/17/2021 CLINICAL DATA:  Bacteremia and fever EXAM: ORTHOPANTOGRAM/PANORAMIC COMPARISON:  None. FINDINGS: Panoramic view of the mandible was obtained and reveals no evidence of acute fracture. Slight decreased attenuation is noted in the left second mandibular molar consistent with dental caries. A few other scattered caries are noted. Erosive changes of the third maxillary molar on the left are seen likely related to extensive dental caries. No periapical lucencies are seen. IMPRESSION: Scattered dental caries worst in the left third maxillary molar. No periapical abscess is noted. Electronically Signed   By: Inez Catalina M.D.   On: 02/17/2021 19:20   ECHOCARDIOGRAM COMPLETE  Result Date: 02/17/2021    ECHOCARDIOGRAM REPORT   Patient Name:   [name] Date of Exam: 02/17/2021 Medical Rec #:  [id number]             Height:       72.0 in Accession #:     [id number]            Weight:       160.5 lb Date of Birth:  April 13, 1971  BSA:          1.940 m Patient Age:    49 years              BP:           166/90 mmHg Patient Gender: M                     HR:           92 bpm. Exam Location:  Inpatient Procedure: 2D Echo, Cardiac Doppler and Color Doppler Indications:    Bacteremia  History:        Patient has no prior history of Echocardiogram examinations.                 Risk Factors:Hypertension.  Sonographer:    Luisa Hart RDCS Referring Phys: [place]  1. The aortic valve is tricuspid. There is mild thickening of the aortic valve. Aortic valve regurgitation is mild to moderate, but in the PLAX and PSAC views, there appear to be both a central and a commissural jets.  2. Left ventricular ejection fraction, by estimation, is 55 to 60%. The left ventricle has normal function. The left ventricle has no regional wall motion abnormalities. The left ventricular internal cavity size was mildly dilated. There is moderate concentric left ventricular hypertrophy. Left ventricular diastolic parameters are consistent with Grade II diastolic dysfunction (pseudonormalization).  3. Right ventricular systolic function is normal. The right ventricular size is mildly enlarged. There is moderately elevated pulmonary artery systolic pressure.  4. Left atrial size was mildly dilated.  5. The mitral valve is abnormal. Mild to moderate mitral valve regurgitation. The mean mitral valve gradient is 5.0 mmHg.  6. Tricuspid valve regurgitation is moderate to severe.  7. Aortic dilatation noted. There is mild dilatation of the ascending aorta, measuring 40 mm. Comparison(s): No prior Echocardiogram. Conclusion(s)/Recommendation(s): Though no vegetation, TEE would be reasonable to consider evaluation of aortic valve, if clinically indicated. FINDINGS  Left Ventricle: Left ventricular ejection fraction, by estimation, is 55 to 60%. The left ventricle has  normal function. The left ventricle has no regional wall motion abnormalities. The left ventricular internal cavity size was mildly dilated. There is  moderate concentric left ventricular hypertrophy. Left ventricular diastolic parameters are consistent with Grade II diastolic dysfunction (pseudonormalization). Right Ventricle: The right ventricular size is mildly enlarged. No increase in right ventricular wall thickness. Right ventricular systolic function is normal. There is moderately elevated pulmonary artery systolic pressure. The tricuspid regurgitant velocity is 3.31 m/s, and with an assumed right atrial pressure of 3 mmHg, the estimated right ventricular systolic pressure is [id number]. Left Atrium: Left atrial size was mildly dilated. Right Atrium: Right atrial size was normal in size. Pericardium: There is no evidence of pericardial effusion. Mitral Valve: The mitral valve is abnormal. There is mild thickening of the mitral valve leaflet(s). Mild to moderate mitral valve regurgitation. MV peak gradient, 10.8 mmHg. The mean mitral valve gradient is 5.0 mmHg with average heart rate of 91 bpm. Tricuspid Valve: The tricuspid valve is normal in structure. Tricuspid valve regurgitation is moderate to severe. No evidence of tricuspid stenosis. Aortic Valve: The aortic valve is tricuspid. There is mild thickening of the aortic valve. Aortic valve regurgitation is mild. Aortic regurgitation PHT measures 351 msec. Aortic valve mean gradient measures 8.5 mmHg. Aortic valve peak gradient measures 15.2 mmHg. Aortic valve area, by VTI measures 3.86 cm. Pulmonic Valve: The pulmonic valve was normal in  structure. Pulmonic valve regurgitation is not visualized. No evidence of pulmonic stenosis. Aorta: Aortic dilatation noted. There is mild dilatation of the ascending aorta, measuring 40 mm. IAS/Shunts: The atrial septum is grossly normal.  LEFT VENTRICLE PLAX 2D LVIDd:         6.00 cm      Diastology LVIDs:         4.70 cm       LV e' medial:    6.85 cm/s LV PW:         1.40 cm      LV E/e' medial:  17.5 LV IVS:        1.30 cm      LV e' lateral:   9.90 cm/s LVOT diam:     2.40 cm      LV E/e' lateral: 12.1 LV SV:         121 LV SV Index:   62 LVOT Area:     4.52 cm  LV Volumes (MOD) LV vol d, MOD A2C: 182.0 ml LV vol d, MOD A4C: 156.0 ml LV vol s, MOD A2C: 85.2 ml LV vol s, MOD A4C: 93.2 ml LV SV MOD A2C:     96.8 ml LV SV MOD A4C:     156.0 ml LV SV MOD BP:      80.9 ml RIGHT VENTRICLE RV Basal diam:  5.10 cm RV Mid diam:    3.50 cm RV S prime:     15.10 cm/s TAPSE (M-mode): 2.9 cm LEFT ATRIUM             Index       RIGHT ATRIUM           Index LA diam:        3.70 cm 1.91 cm/m  RA Area:     16.70 cm LA Vol (A2C):   64.3 ml 33.14 ml/m RA Volume:   43.00 ml  22.16 ml/m LA Vol (A4C):   59.6 ml 30.72 ml/m LA Biplane Vol: 66.1 ml 34.07 ml/m  AORTIC VALVE                    PULMONIC VALVE AV Area (Vmax):    4.04 cm     PV Vmax:       1.42 m/s AV Area (Vmean):   3.94 cm     PV Vmean:      106.000 cm/s AV Area (VTI):     3.86 cm     PV VTI:        0.270 m AV Vmax:           195.00 cm/s  PV Peak grad:  8.1 mmHg AV Vmean:          135.500 cm/s PV Mean grad:  5.0 mmHg AV VTI:            0.314 m AV Peak Grad:      15.2 mmHg AV Mean Grad:      8.5 mmHg LVOT Vmax:         174.00 cm/s LVOT Vmean:        118.000 cm/s LVOT VTI:          0.268 m LVOT/AV VTI ratio: 0.85 AI PHT:            351 msec  AORTA Ao Root diam: 3.50 cm Ao Asc diam:  3.75 cm MITRAL VALVE  TRICUSPID VALVE MV Area (PHT): 5.23 cm      TR Peak grad:   43.8 mmHg MV Area VTI:   3.28 cm      TR Vmax:        331.00 cm/s MV Peak grad:  10.8 mmHg MV Mean grad:  5.0 mmHg      SHUNTS MV Vmax:       1.64 m/s      Systemic VTI:  0.27 m MV Vmean:      105.0 cm/s    Systemic Diam: 2.40 cm MV Decel Time: 145 msec MR Peak grad:    114.9 mmHg MR Mean grad:    77.0 mmHg MR Vmax:         536.00 cm/s MR Vmean:        424.0 cm/s MR PISA:         1.01 cm MR PISA Eff ROA: 6  mm MR PISA Radius:  0.40 cm MV E velocity: 120.00 cm/s MV A velocity: 88.90 cm/s MV E/A ratio:  1.35 Rudean Haskell MD Electronically signed by Rudean Haskell MD Signature Date/Time: 02/17/2021/7:10:14 PM    Final      Medications:   .  ceFAZolin (ANCEF) IV 2 g (02/17/21 1217)   . amLODipine  10 mg Oral Daily  . benzonatate  100 mg Oral Q8H  . calcitRIOL  0.25 mcg Oral Q M,W,F-HD  . calcium carbonate  2 tablet Oral TID WC  . calcium carbonate  2 tablet Oral QHS  . Chlorhexidine Gluconate Cloth  6 each Topical Q0600  . Chlorhexidine Gluconate Cloth  6 each Topical Q0600  . Chlorhexidine Gluconate Cloth  6 each Topical Q0600  . darbepoetin (ARANESP) injection - DIALYSIS  60 mcg Intravenous Q Wed-HD  . heparin  5,000 Units Subcutaneous Q8H  . hydrALAZINE  50 mg Oral TID  . multivitamin  1 tablet Oral QHS  . sodium chloride flush  3 mL Intravenous Q12H   acetaminophen **OR** acetaminophen, camphor-menthol **AND** hydrOXYzine, docusate sodium, feeding supplement (NEPRO CARB STEADY), HYDROcodone-acetaminophen, sorbitol, zolpidem  Assessment/ Plan:  1. Hypoxia-improved.CTwith opacities likely atypical PNAbut also trace pulmonary edema,   2. Bacteremia - +BC for streptococcus cristatus. Etiology unclear. CT abdomen with indistinct L renal lesion suspicious for infection vs intracystic hemorrhage. No abdominal pain or symptoms.Unable to aspirate due to location. AVG does not appear infected. Oncefazolin.  Multiple dental caries on on orthopantogram. Repeat BC negative.  TTE inconclusive, plan for TEE, ID following. 3. ESRD- On HD MWF.HD5/21/2022 4. Hypertension/volume-hydralazine 75 mg 3 times daily, amlodipine 10 mg daily.  Challenge dry weight 5. Anemiaof CKD-  Aranesp 39mg qwk   6. Secondary Hyperparathyroidism -S/p parathyroidectomy. CCa close to goal. Phos at goal.Binders reduced to 2 TUMS AC TID   7. Nutrition- Renal diet w/fluid restrictions.   8. Hep B 9. Polycystic kidneysw/indistinct left renal cyst/lesion- ?recent intracystic hemorrhagevsinfection. On ABX. No abdominal symptoms.IR unable to aspirate due to location.     LOS: 4[place]'@TODAY''@6'$ :32 AM

## 2021-02-19 NOTE — Transfer of Care (Signed)
Immediate Anesthesia Transfer of Care Note  Patient: [name]  Procedure(s) Performed: TRANSESOPHAGEAL ECHOCARDIOGRAM (TEE) (N/A )  Patient Location: Endoscopy Unit  Anesthesia Type:MAC  Level of Consciousness: awake, alert  and oriented  Airway & Oxygen Therapy: Patient Spontanous Breathing and Patient connected to nasal cannula oxygen  Post-op Assessment: Report given to RN, Post -op Vital signs reviewed and stable and Patient moving all extremities X 4  Post vital signs: Reviewed and stable  Last Vitals:  Vitals Value Taken Time  BP 148/90 02/19/21 1442  Temp    Pulse 83 02/19/21 1443  Resp 26 02/19/21 1443  SpO2 98 % 02/19/21 1443  Vitals shown include unvalidated device data.  Last Pain:  Vitals:   02/19/21 1300  TempSrc: Oral  PainSc: 0-No pain      Patients Stated Pain Goal: 2 (66/29/47 6546)  Complications: No complications documented.

## 2021-02-19 NOTE — Progress Notes (Signed)
  Echocardiogram Echocardiogram Transesophageal has been performed.  Merrie Roof F 02/19/2021, 2:44 PM

## 2021-02-19 NOTE — Interval H&P Note (Signed)
History and Physical Interval Note:  02/19/2021 1:19 PM  [name]  has presented today for surgery, with the diagnosis of BACTEREMIA.  The various methods of treatment have been discussed with the patient and family. After consideration of risks, benefits and other options for treatment, the patient has consented to  Procedure(s): TRANSESOPHAGEAL ECHOCARDIOGRAM (TEE) (N/A) as a surgical intervention.  The patient's history has been reviewed, patient examined, no change in status, stable for surgery.  I have reviewed the patient's chart and labs.  Questions were answered to the patient's satisfaction.     [name]

## 2021-02-19 NOTE — Progress Notes (Signed)
This nurse discussed discharge paperwork with patient and updated plan of care. Work note provided to patient. All questions and concerns answered at this time

## 2021-02-19 NOTE — Progress Notes (Signed)
Patient arrived back from  HD. This nurse attempted called endo and made Hope aware he is back and ready to go.

## 2021-02-19 NOTE — Progress Notes (Signed)
[facility] for Infectious Disease  Date of Admission:  02/14/2021      Total days of antibiotics 4   Ceftriaxone 5/16 >> 5/17  Vancomycin 5/16 >> 5/16  Azithromycin  5/16 >> 5/16  Cefazolin 5/18 >> current           ASSESSMENT: [name] is a 50 y.o. male with subacute history of fevers, chills and malaise x 3 weeks growing streptococcus cristatus in his blood 4/4 bottles. TTE was inconclusive and given concerning findings for endocarditis we are proceeding with TEE this afternoon. Discussed with him and reassured him for procedure. Will follow up results and help with recommendations going forward for duration of cefazolin therapy after HD sessions.   He will need dental referral for outpatient management of chronic dental carries noted on panorex.   Chronic hepatitis B - viral load is 900 copies, normal LFTs, eAg negative indicating chronic inactive infection. Will await Fibrosis assessment to determine if he needs any consideration for treatment. This will be followed up on outpatient.     PLAN: 1. Follow repeat blood cultures  2. Follow TEE results  3. Needs dental referral outpatient   4. Continue cefazolin    Principal Problem:   Streptococcal bacteremia Active Problems:   ESRD (end stage renal disease) on dialysis [facility])   Essential hypertension   Chronic viral hepatitis B without delta-agent ([place])   . amLODipine  10 mg Oral Daily  . benzonatate  100 mg Oral Q8H  . calcitRIOL      . calcitRIOL  0.25 mcg Oral Q M,W,F-HD  . calcium carbonate  2 tablet Oral TID WC  . calcium carbonate  2 tablet Oral QHS  . Chlorhexidine Gluconate Cloth  6 each Topical Q0600  . Chlorhexidine Gluconate Cloth  6 each Topical Q0600  . Chlorhexidine Gluconate Cloth  6 each Topical Q0600  . darbepoetin (ARANESP) injection - DIALYSIS  60 mcg Intravenous Q Wed-HD  . heparin  5,000 Units Subcutaneous Q8H  . heparin sodium (porcine)      . hydrALAZINE  50 mg Oral  TID  . multivitamin  1 tablet Oral QHS  . sodium chloride flush  3 mL Intravenous Q12H    SUBJECTIVE: Feels improved overall. Worried about the TEE later today.     Review of Systems: Review of Systems  Constitutional: Negative for chills and fever.  HENT: Negative for tinnitus.   Eyes: Negative for blurred vision and photophobia.  Respiratory: Negative for cough and sputum production.   Cardiovascular: Negative for chest pain.  Gastrointestinal: Negative for diarrhea, nausea and vomiting.  Genitourinary: Negative for dysuria.  Skin: Negative for rash.  Neurological: Negative for headaches.  Psychiatric/Behavioral: Negative for depression. The patient is nervous/anxious.     Allergies  Allergen Reactions  . Ivp Dye [Iodinated Diagnostic Agents] Swelling    SWELLING REACTION UNSPECIFIED   . Lisinopril Cough  . Solu-Medrol [Methylprednisolone] Nausea And Vomiting    OBJECTIVE: Vitals:   02/19/21 0930 02/19/21 1000 02/19/21 1030 02/19/21 1100  BP: (!) 155/94 (!) 151/94 (!) 153/94 (!) 158/92  Pulse: 77 80 84 81  Resp:      Temp:      TempSrc:      SpO2:      Weight:      Height:       Body mass index is 22.19 kg/m.  Physical Exam Vitals and nursing note reviewed.  Constitutional:  Appearance: He is well-developed.     Comments: Resting quietly undergoing hemodialysis session   HENT:     Mouth/Throat:     Dentition: Normal dentition. No dental abscesses.  Cardiovascular:     Rate and Rhythm: Normal rate and regular rhythm. Frequent extrasystoles are present.    Heart sounds: Normal heart sounds.     Comments: No obvious murmurs heard Pulmonary:     Effort: Pulmonary effort is normal.     Breath sounds: Normal breath sounds.  Abdominal:     General: There is no distension.     Palpations: Abdomen is soft.     Tenderness: There is no abdominal tenderness.  Lymphadenopathy:     Cervical: No cervical adenopathy.  Skin:    General: Skin is warm and dry.      Findings: No rash.  Neurological:     Mental Status: He is alert and oriented to person, place, and time.  Psychiatric:        Judgment: Judgment normal.     Lab Results Lab Results  Component Value Date   WBC 7.6 02/19/2021   HGB 10.2 (L) 02/19/2021   HCT 31.7 (L) 02/19/2021   MCV 91.1 02/19/2021   PLT 381 02/19/2021    Lab Results  Component Value Date   CREATININE 12.51 (H) 02/19/2021   BUN 38 (H) 02/19/2021   NA 135 02/19/2021   K 4.4 02/19/2021   CL 98 02/19/2021   CO2 25 02/19/2021    Lab Results  Component Value Date   ALT 16 02/14/2021   AST 15 02/14/2021   ALKPHOS 94 02/14/2021   BILITOT 1.4 (H) 02/14/2021     Microbiology: Recent Results (from the past 240 hour(s))  Resp Panel by RT-PCR (Flu A&B, Covid) Nasopharyngeal Swab     Status: None   Collection Time: 02/14/21  8:10 PM   Specimen: Nasopharyngeal Swab; Nasopharyngeal(NP) swabs in vial transport medium  Result Value Ref Range Status   SARS Coronavirus 2 by RT PCR NEGATIVE NEGATIVE Final    Comment: (NOTE) SARS-CoV-2 target nucleic acids are NOT DETECTED.  The SARS-CoV-2 RNA is generally detectable in upper respiratory specimens during the acute phase of infection. The lowest concentration of SARS-CoV-2 viral copies this assay can detect is 138 copies/mL. A negative result does not preclude SARS-Cov-2 infection and should not be used as the sole basis for treatment or other patient management decisions. A negative result may occur with  improper specimen collection/handling, submission of specimen other than nasopharyngeal swab, presence of viral mutation(s) within the areas targeted by this assay, and inadequate number of viral copies(<138 copies/mL). A negative result must be combined with clinical observations, patient history, and epidemiological information. The expected result is Negative.  Fact Sheet for Patients:  EntrepreneurPulse.com.au  Fact Sheet for  Healthcare Providers:  IncredibleEmployment.be  This test is no t yet approved or cleared by the [place] FDA and  has been authorized for detection and/or diagnosis of SARS-CoV-2 by FDA under an Emergency Use Authorization (EUA). This EUA will remain  in effect (meaning this test can be used) for the duration of the COVID-19 declaration under Section 564(b)(1) of the Act, 21 U.S.C.section 360bbb-3(b)(1), unless the authorization is terminated  or revoked sooner.       Influenza A by PCR NEGATIVE NEGATIVE Final   Influenza B by PCR NEGATIVE NEGATIVE Final    Comment: (NOTE) The Xpert Xpress SARS-CoV-2/FLU/RSV plus assay is intended as an aid in the diagnosis of influenza  from Nasopharyngeal swab specimens and should not be used as a sole basis for treatment. Nasal washings and aspirates are unacceptable for Xpert Xpress SARS-CoV-2/FLU/RSV testing.  Fact Sheet for Patients: EntrepreneurPulse.com.au  Fact Sheet for Healthcare Providers: IncredibleEmployment.be  This test is not yet approved or cleared by the [place] FDA and has been authorized for detection and/or diagnosis of SARS-CoV-2 by FDA under an Emergency Use Authorization (EUA). This EUA will remain in effect (meaning this test can be used) for the duration of the COVID-19 declaration under Section 564(b)(1) of the Act, 21 U.S.C. section 360bbb-3(b)(1), unless the authorization is terminated or revoked.  Performed at [facility], [place] [address], [place], [place] [address]   Blood culture (routine x 2)     Status: Abnormal   Collection Time: 02/15/21  4:43 AM   Specimen: BLOOD RIGHT FOREARM  Result Value Ref Range Status   Specimen Description BLOOD RIGHT FOREARM  Final   Special Requests   Final    BOTTLES DRAWN AEROBIC AND ANAEROBIC Blood Culture results may not be optimal due to an inadequate volume of blood received in culture bottles    Culture  Setup Time   Final    GRAM POSITIVE COCCI IN CHAINS IN CLUSTERS IN BOTH AEROBIC AND ANAEROBIC BOTTLES CRITICAL RESULT CALLED TO, READ BACK BY AND VERIFIED WITH: PHARMD LAURA SEAY BY MESSAN H. AT 0012 ON 02/16/2021 Performed at [facility], [place] [address], [place], [place] [address]    Culture STREPTOCOCCUS CRISTATUS (A)  Final   Report Status 02/17/2021 FINAL  Final   Organism ID, Bacteria STREPTOCOCCUS CRISTATUS  Final      Susceptibility   Streptococcus cristatus - MIC*    PENICILLIN <=0.06 SENSITIVE Sensitive     CEFTRIAXONE <=0.12 SENSITIVE Sensitive     ERYTHROMYCIN <=0.12 SENSITIVE Sensitive     LEVOFLOXACIN 1 SENSITIVE Sensitive     VANCOMYCIN 0.5 SENSITIVE Sensitive     * STREPTOCOCCUS CRISTATUS  Blood Culture ID Panel (Reflexed)     Status: Abnormal   Collection Time: 02/15/21  4:43 AM  Result Value Ref Range Status   Enterococcus faecalis NOT DETECTED NOT DETECTED Final   Enterococcus Faecium NOT DETECTED NOT DETECTED Final   Listeria monocytogenes NOT DETECTED NOT DETECTED Final   Staphylococcus species NOT DETECTED NOT DETECTED Final   Staphylococcus aureus (BCID) NOT DETECTED NOT DETECTED Final   Staphylococcus epidermidis NOT DETECTED NOT DETECTED Final   Staphylococcus lugdunensis NOT DETECTED NOT DETECTED Final   Streptococcus species DETECTED (A) NOT DETECTED Final    Comment: Not Enterococcus species, Streptococcus agalactiae, Streptococcus pyogenes, or Streptococcus pneumoniae. CRITICAL RESULT CALLED TO, READ BACK BY AND VERIFIED WITH: PHARMD LAURA SEAY BY MESSAN H. AT 0012 ON 02/16/2021    Streptococcus agalactiae NOT DETECTED NOT DETECTED Final   Streptococcus pneumoniae NOT DETECTED NOT DETECTED Final   Streptococcus pyogenes NOT DETECTED NOT DETECTED Final   A.calcoaceticus-baumannii NOT DETECTED NOT DETECTED Final   Bacteroides fragilis NOT DETECTED NOT DETECTED Final   Enterobacterales NOT DETECTED NOT DETECTED Final   Enterobacter cloacae  complex NOT DETECTED NOT DETECTED Final   Escherichia coli NOT DETECTED NOT DETECTED Final   Klebsiella aerogenes NOT DETECTED NOT DETECTED Final   Klebsiella oxytoca NOT DETECTED NOT DETECTED Final   Klebsiella pneumoniae NOT DETECTED NOT DETECTED Final   Proteus species NOT DETECTED NOT DETECTED Final   Salmonella species NOT DETECTED NOT DETECTED Final   Serratia marcescens NOT DETECTED NOT DETECTED Final  Haemophilus influenzae NOT DETECTED NOT DETECTED Final   Neisseria meningitidis NOT DETECTED NOT DETECTED Final   Pseudomonas aeruginosa NOT DETECTED NOT DETECTED Final   Stenotrophomonas maltophilia NOT DETECTED NOT DETECTED Final   Candida albicans NOT DETECTED NOT DETECTED Final   Candida auris NOT DETECTED NOT DETECTED Final   Candida glabrata NOT DETECTED NOT DETECTED Final   Candida krusei NOT DETECTED NOT DETECTED Final   Candida parapsilosis NOT DETECTED NOT DETECTED Final   Candida tropicalis NOT DETECTED NOT DETECTED Final   Cryptococcus neoformans/gattii NOT DETECTED NOT DETECTED Final    Comment: Performed at [facility], [place] [address], [place], [place] [address]  Blood culture (routine x 2)     Status: Abnormal   Collection Time: 02/15/21  8:41 AM   Specimen: BLOOD  Result Value Ref Range Status   Specimen Description BLOOD SITE NOT SPECIFIED  Final   Special Requests   Final    BOTTLES DRAWN AEROBIC AND ANAEROBIC Blood Culture results may not be optimal due to an inadequate volume of blood received in culture bottles   Culture  Setup Time   Final    GRAM POSITIVE COCCI IN CHAINS IN CLUSTERS IN BOTH AEROBIC AND ANAEROBIC BOTTLES CRITICAL RESULT CALLED TO, READ BACK BY AND VERIFIED WITH: CRITICAL VALUE NOTED.  VALUE IS CONSISTENT WITH PREVIOUSLY REPORTED AND CALLED VALUE.    Culture (A)  Final    STREPTOCOCCUS CRISTATUS SUSCEPTIBILITIES PERFORMED ON PREVIOUS CULTURE WITHIN THE LAST 5 DAYS. Performed at [facility], [place] [address] [place],  [place] [address]    Report Status 02/17/2021 FINAL  Final  Respiratory (~20 pathogens) panel by PCR     Status: None   Collection Time: 02/15/21  8:41 AM   Specimen: Nasopharyngeal Swab; Respiratory  Result Value Ref Range Status   Adenovirus NOT DETECTED NOT DETECTED Final   Coronavirus 229E NOT DETECTED NOT DETECTED Final    Comment: (NOTE) The Coronavirus on the Respiratory Panel, DOES NOT test for the novel  Coronavirus (2019 nCoV)    Coronavirus HKU1 NOT DETECTED NOT DETECTED Final   Coronavirus NL63 NOT DETECTED NOT DETECTED Final   Coronavirus OC43 NOT DETECTED NOT DETECTED Final   Metapneumovirus NOT DETECTED NOT DETECTED Final   Rhinovirus / Enterovirus NOT DETECTED NOT DETECTED Final   Influenza A NOT DETECTED NOT DETECTED Final   Influenza B NOT DETECTED NOT DETECTED Final   Parainfluenza Virus 1 NOT DETECTED NOT DETECTED Final   Parainfluenza Virus 2 NOT DETECTED NOT DETECTED Final   Parainfluenza Virus 3 NOT DETECTED NOT DETECTED Final   Parainfluenza Virus 4 NOT DETECTED NOT DETECTED Final   Respiratory Syncytial Virus NOT DETECTED NOT DETECTED Final   Bordetella pertussis NOT DETECTED NOT DETECTED Final   Bordetella Parapertussis NOT DETECTED NOT DETECTED Final   Chlamydophila pneumoniae NOT DETECTED NOT DETECTED Final   Mycoplasma pneumoniae NOT DETECTED NOT DETECTED Final    Comment: Performed at [facility], [place]. [address] [place], [place] [address]  Culture, blood (Routine X 2) w Reflex to ID Panel     Status: None (Preliminary result)   Collection Time: 02/17/21  5:12 AM   Specimen: BLOOD  Result Value Ref Range Status   Specimen Description BLOOD RIGHT ANTECUBITAL  Final   Special Requests   Final    BOTTLES DRAWN AEROBIC AND ANAEROBIC Blood Culture results may not be optimal due to an excessive volume of blood received in culture bottles   Culture  Final    NO GROWTH 2 DAYS Performed at [facility], [place] [address], [place], [place] [address]     Report Status PENDING  Incomplete  Culture, blood (Routine X 2) w Reflex to ID Panel     Status: None (Preliminary result)   Collection Time: 02/17/21  5:12 AM   Specimen: BLOOD RIGHT HAND  Result Value Ref Range Status   Specimen Description BLOOD RIGHT HAND  Final   Special Requests   Final    BOTTLES DRAWN AEROBIC AND ANAEROBIC Blood Culture adequate volume   Culture   Final    NO GROWTH 2 DAYS Performed at [facility], [place] [address], [place], [place] [address]    Report Status PENDING  Incomplete     Janene Madeira, MSN, NP-C [facility] for Infectious Disease [email or web address] Pager: [phone] Office: [phone] [place]: [phone]

## 2021-02-20 ENCOUNTER — Telehealth: Payer: Self-pay | Admitting: Nurse Practitioner

## 2021-02-20 NOTE — Telephone Encounter (Signed)
Transition of care contact from inpatient facility  Date of Discharge: 02/19/2021 Date of Contact: 02/20/2021 Method of contact: Phone  Attempted to contact patient to discuss transition of care from inpatient admission. Patient did not answer the phone. Message was left on the patient's voicemail with call back number [phone].

## 2021-02-21 ENCOUNTER — Encounter (HOSPITAL_COMMUNITY): Payer: Self-pay | Admitting: Internal Medicine

## 2021-02-22 LAB — CULTURE, BLOOD (ROUTINE X 2)
Culture: NO GROWTH
Culture: NO GROWTH
Special Requests: ADEQUATE

## 2021-02-22 NOTE — Anesthesia Postprocedure Evaluation (Signed)
Anesthesia Post Note  Patient: [name]  Procedure(s) Performed: TRANSESOPHAGEAL ECHOCARDIOGRAM (TEE) (N/A )     Patient location during evaluation: Endoscopy Anesthesia Type: MAC Level of consciousness: awake and alert Pain management: pain level controlled Vital Signs Assessment: post-procedure vital signs reviewed and stable Respiratory status: spontaneous breathing, nonlabored ventilation, respiratory function stable and patient connected to nasal cannula oxygen Cardiovascular status: stable and blood pressure returned to baseline Postop Assessment: no apparent nausea or vomiting Anesthetic complications: no   No complications documented.  Last Vitals:  Vitals:   02/19/21 1519 02/19/21 1548  BP: (!) 146/90 (!) 156/101  Pulse: 86 89  Resp: 20 20  Temp:    SpO2: 100% 100%    Last Pain:  Vitals:   02/19/21 1519  TempSrc:   PainSc: 0-No pain                 Thurl Boen S

## 2021-02-24 LAB — HEPATITIS B SURFACE ANTIGEN: Hepatitis B Surface Ag: REACTIVE — AB

## 2021-03-04 ENCOUNTER — Ambulatory Visit (INDEPENDENT_AMBULATORY_CARE_PROVIDER_SITE_OTHER): Payer: Self-pay | Admitting: Internal Medicine

## 2021-03-04 ENCOUNTER — Other Ambulatory Visit: Payer: Self-pay

## 2021-03-04 ENCOUNTER — Encounter: Payer: Self-pay | Admitting: Internal Medicine

## 2021-03-04 DIAGNOSIS — B955 Unspecified streptococcus as the cause of diseases classified elsewhere: Secondary | ICD-10-CM

## 2021-03-04 DIAGNOSIS — R7881 Bacteremia: Secondary | ICD-10-CM

## 2021-03-04 DIAGNOSIS — B181 Chronic viral hepatitis B without delta-agent: Secondary | ICD-10-CM

## 2021-03-04 NOTE — Patient Instructions (Signed)
Thank you for coming to see me today. It was a pleasure seeing you.  To Do: Marland Kitchen Continue antibiotics with dialysis through 03/17/21 to treat your infection . Follow up with me in 3 months to continue monitoring your chronic hepatitis B infection.  Currently you do not need medication for this.  If you have any questions or concerns, please do not hesitate to call the office at [phone].  Take Care,   Jule Ser, DO

## 2021-03-04 NOTE — Progress Notes (Signed)
[facility] for Infectious Disease  Reason for Consult: hospital follow up for bacteremia and hep B  Referring Provider: n/a   HPI:    [name] is a 50 y.o. male with PMHx as below who presents to the clinic for hospital follow up of bacteremia and hepatitis B.   Patient was recently admitted to [facility] from 5/15-5/20 for sepsis 2/2 streptococcal bacteremia.  He was treated with IV antibiotics and narrowed to cefazolin with HD at time of discharge.  TTE and TEE were done which did not show evidence of IE.  He is still getting cefazoin with HD through 6/15. He is tolerating and not having any fevers, chiills, or other symptoms of recurrent infection.  He does endorse fatigue when getting the antibiotic infusion.  He also was found to have chronic hepatitis B with surface Ag positive, HBV DNA PCR 900, LFTs normal, and Fibrosure F0.  His HCV Ab was negative and he is immune to hepatitis A.  CT abd/pelvis during admit was negative for any suspicious liver lesions. He is not on antiviral medication for his HBV.   Patient's Medications  New Prescriptions   No medications on file  Previous Medications   ACETAMINOPHEN (TYLENOL) 500 MG TABLET    Take 1,000 mg by mouth daily as needed (for pain or headaches).    ALBUTEROL (VENTOLIN HFA) 108 (90 BASE) MCG/ACT INHALER    Inhale 1-2 puffs into the lungs every 6 (six) hours as needed for wheezing.   AMLODIPINE (NORVASC) 10 MG TABLET    Take 10 mg by mouth daily.   B COMPLEX-C-FOLIC ACID (DIALYVITE TABLET) TABS    Take 1 tablet by mouth daily.   BENZONATATE (TESSALON) 100 MG CAPSULE    Take 1 capsule (100 mg total) by mouth every 8 (eight) hours.   CEFAZOLIN (ANCEF) 2-4 GM/100ML-% IVPB    Inject 100 mLs (2 g total) into the vein every Monday, Wednesday, and Friday with hemodialysis for 26 days.   HYDRALAZINE (APRESOLINE) 50 MG TABLET    Take 50 mg by mouth 3 (three) times daily.   HYDROCODONE-ACETAMINOPHEN (NORCO) 5-325 MG  TABLET    Take 1 tablet by mouth every 4 (four) hours as needed (cough).   LIDOCAINE-PRILOCAINE (EMLA) CREAM    Apply 1 application topically daily as needed (port access).    TUMS ULTRA 1000 1000 MG CHEWABLE TABLET    Chew 4,000 mg by mouth 3 (three) times daily with meals.  Modified Medications   No medications on file  Discontinued Medications   No medications on file      Past Medical History:  Diagnosis Date  . Anemia   . Anxiety   . Cough    DRY    . Depression   . ESRD on hemodialysis ([place])    HD [facility] MWF  . Headache(784.0)   . Hypertension   . Muscle spasms of neck    BACK, NECK  . Pneumonia 2015ish  . Thyroid disease     Social History   Tobacco Use  . Smoking status: Former Smoker    Quit date: 03/21/1986    Years since quitting: 34.9  . Smokeless tobacco: Never Used  Substance Use Topics  . Alcohol use: No    Alcohol/week: 0.0 standard drinks  . Drug use: No    Family History  Problem Relation Age of Onset  . Renal Disease Neg Hx     Allergies  Allergen Reactions  .  Ivp Dye [Iodinated Diagnostic Agents] Swelling    SWELLING REACTION UNSPECIFIED   . Lisinopril Cough  . Solu-Medrol [Methylprednisolone] Nausea And Vomiting    Review of Systems  Constitutional: Positive for malaise/fatigue. Negative for chills and fever.  Respiratory: Negative.   Cardiovascular: Negative.   Gastrointestinal: Negative.   Genitourinary: Negative.   Musculoskeletal: Negative.       OBJECTIVE:    Vitals:   03/04/21 1551  BP: (!) 153/68  Pulse: 85  Weight: 166 lb (75.3 kg)     Body mass index is 22.51 kg/m.  Physical Exam Constitutional:      General: He is not in acute distress.    Appearance: Normal appearance.  HENT:     Head: Normocephalic and atraumatic.  Eyes:     General: No scleral icterus.    Extraocular Movements: Extraocular movements intact.     Conjunctiva/sclera: Conjunctivae normal.  Pulmonary:     Effort: Pulmonary effort  is normal. No respiratory distress.  Skin:    General: Skin is warm and dry.     Coloration: Skin is not jaundiced.  Neurological:     General: No focal deficit present.     Mental Status: He is alert and oriented to person, place, and time.  Psychiatric:        Mood and Affect: Mood normal.        Behavior: Behavior normal.      Labs and Microbiology:  CBC Latest Ref Rng & Units 02/19/2021 02/17/2021 02/16/2021  WBC 4.0 - 10.5 K/uL 7.6 4.7 6.8  Hemoglobin 13.0 - 17.0 g/dL 10.2(L) 10.2(L) 9.7(L)  Hematocrit 39.0 - 52.0 % 31.7(L) 31.3(L) 29.6(L)  Platelets 150 - 400 K/uL 381 303 252   CMP Latest Ref Rng & Units 02/19/2021 02/17/2021 02/16/2021  Glucose 70 - 99 mg/dL 85 94 102(H)  BUN 6 - 20 mg/dL 38(H) 44(H) 85(H)  Creatinine 0.61 - 1.24 mg/dL 12.51(H) 11.66(H) 17.01(H)  Sodium 135 - 145 mmol/L 135 135 134(L)  Potassium 3.5 - 5.1 mmol/L 4.4 3.7 4.6  Chloride 98 - 111 mmol/L 98 97(L) 95(L)  CO2 22 - 32 mmol/L '25 26 22  '$ Calcium 8.9 - 10.3 mg/dL 7.4(L) 7.4(L) 6.7(L)  Total Protein 6.5 - 8.1 g/dL - - -  Total Bilirubin 0.3 - 1.2 mg/dL - - -  Alkaline Phos 38 - 126 U/L - - -  AST 15 - 41 U/L - - -  ALT 0 - 44 U/L - - -        ASSESSMENT & PLAN:    Streptococcal bacteremia Patient is doing well without signs or symptoms of recurrent infection.  WIll continue cefazolin as anticipated through 6/15 with his HD.   Chronic viral hepatitis B without delta-agent ([place]) Patient with chronic hepatitis B likely acquired growing up in an endemic area ([place]).  His labs appear c/w inactive CHB with normal LFTs, HBV DNA PCR less than 2,000, and positive surface Ag.  CT abd/pelvis in May was negative for [facility].  Will continue periodic monitoring of HBV DNA levels, LFTs, surface Ag, and every 6 month HCC screening.  RTC 3 months.    No orders of the defined types were placed in this encounter.     Raynelle Highland for Infectious Disease [facility] 03/05/2021,  8:20 AM

## 2021-03-05 ENCOUNTER — Encounter: Payer: Self-pay | Admitting: Internal Medicine

## 2021-03-05 NOTE — Assessment & Plan Note (Signed)
Patient with chronic hepatitis B likely acquired growing up in an endemic area ([place]).  His labs appear c/w inactive CHB with normal LFTs, HBV DNA PCR less than 2,000, and positive surface Ag.  CT abd/pelvis in May was negative for [facility].  Will continue periodic monitoring of HBV DNA levels, LFTs, surface Ag, and every 6 month HCC screening.  RTC 3 months.

## 2021-03-05 NOTE — Assessment & Plan Note (Signed)
Patient is doing well without signs or symptoms of recurrent infection.  WIll continue cefazolin as anticipated through 6/15 with his HD.

## 2021-05-27 ENCOUNTER — Emergency Department (HOSPITAL_COMMUNITY)
Admission: EM | Admit: 2021-05-27 | Discharge: 2021-05-27 | Disposition: A | Discharge status: Home / Self Care | Payer: Self-pay | Attending: Emergency Medicine | Admitting: Emergency Medicine

## 2021-05-27 ENCOUNTER — Emergency Department (HOSPITAL_COMMUNITY): Payer: Self-pay

## 2021-05-27 ENCOUNTER — Other Ambulatory Visit: Payer: Self-pay

## 2021-05-27 DIAGNOSIS — Z992 Dependence on renal dialysis: Secondary | ICD-10-CM | POA: Insufficient documentation

## 2021-05-27 DIAGNOSIS — U071 COVID-19: Secondary | ICD-10-CM | POA: Insufficient documentation

## 2021-05-27 DIAGNOSIS — E8771 Transfusion associated circulatory overload: Secondary | ICD-10-CM | POA: Insufficient documentation

## 2021-05-27 DIAGNOSIS — E875 Hyperkalemia: Secondary | ICD-10-CM | POA: Insufficient documentation

## 2021-05-27 DIAGNOSIS — Z79899 Other long term (current) drug therapy: Secondary | ICD-10-CM | POA: Insufficient documentation

## 2021-05-27 DIAGNOSIS — N186 End stage renal disease: Secondary | ICD-10-CM | POA: Insufficient documentation

## 2021-05-27 DIAGNOSIS — I120 Hypertensive chronic kidney disease with stage 5 chronic kidney disease or end stage renal disease: Secondary | ICD-10-CM | POA: Insufficient documentation

## 2021-05-27 DIAGNOSIS — Z87891 Personal history of nicotine dependence: Secondary | ICD-10-CM | POA: Insufficient documentation

## 2021-05-27 LAB — CBC WITH DIFFERENTIAL/PLATELET
Abs Immature Granulocytes: 0.01 10*3/uL (ref 0.00–0.07)
Basophils Absolute: 0 10*3/uL (ref 0.0–0.1)
Basophils Relative: 0 %
Eosinophils Absolute: 0.1 10*3/uL (ref 0.0–0.5)
Eosinophils Relative: 1 %
HCT: 39.7 % (ref 39.0–52.0)
Hemoglobin: 13.4 g/dL (ref 13.0–17.0)
Immature Granulocytes: 0 %
Lymphocytes Relative: 14 %
Lymphs Abs: 0.9 10*3/uL (ref 0.7–4.0)
MCH: 30.5 pg (ref 26.0–34.0)
MCHC: 33.8 g/dL (ref 30.0–36.0)
MCV: 90.4 fL (ref 80.0–100.0)
Monocytes Absolute: 0.7 10*3/uL (ref 0.1–1.0)
Monocytes Relative: 11 %
Neutro Abs: 5 10*3/uL (ref 1.7–7.7)
Neutrophils Relative %: 74 %
Platelets: 156 10*3/uL (ref 150–400)
RBC: 4.39 MIL/uL (ref 4.22–5.81)
RDW: 17.1 % — ABNORMAL HIGH (ref 11.5–15.5)
WBC: 6.7 10*3/uL (ref 4.0–10.5)
nRBC: 0 % (ref 0.0–0.2)

## 2021-05-27 LAB — HEPATITIS B CORE ANTIBODY, TOTAL: Hep B Core Total Ab: REACTIVE — AB

## 2021-05-27 LAB — COMPREHENSIVE METABOLIC PANEL
ALT: 14 U/L (ref 0–44)
AST: 25 U/L (ref 15–41)
Albumin: 4.1 g/dL (ref 3.5–5.0)
Alkaline Phosphatase: 65 U/L (ref 38–126)
Anion gap: 20 — ABNORMAL HIGH (ref 5–15)
BUN: 85 mg/dL — ABNORMAL HIGH (ref 6–20)
CO2: 22 mmol/L (ref 22–32)
Calcium: 8.5 mg/dL — ABNORMAL LOW (ref 8.9–10.3)
Chloride: 96 mmol/L — ABNORMAL LOW (ref 98–111)
Creatinine, Ser: 17.96 mg/dL — ABNORMAL HIGH (ref 0.61–1.24)
GFR, Estimated: 3 mL/min — ABNORMAL LOW (ref >60)
Glucose, Bld: 96 mg/dL (ref 70–99)
Potassium: 7.5 mmol/L (ref 3.5–5.1)
Sodium: 138 mmol/L (ref 135–145)
Total Bilirubin: 0.8 mg/dL (ref 0.3–1.2)
Total Protein: 8.6 g/dL — ABNORMAL HIGH (ref 6.5–8.1)

## 2021-05-27 LAB — BASIC METABOLIC PANEL
Anion gap: 20 — ABNORMAL HIGH (ref 5–15)
BUN: 84 mg/dL — ABNORMAL HIGH (ref 6–20)
CO2: 22 mmol/L (ref 22–32)
Calcium: 8.1 mg/dL — ABNORMAL LOW (ref 8.9–10.3)
Chloride: 93 mmol/L — ABNORMAL LOW (ref 98–111)
Creatinine, Ser: 17.64 mg/dL — ABNORMAL HIGH (ref 0.61–1.24)
GFR, Estimated: 3 mL/min — ABNORMAL LOW (ref >60)
Glucose, Bld: 151 mg/dL — ABNORMAL HIGH (ref 70–99)
Potassium: 6.8 mmol/L (ref 3.5–5.1)
Sodium: 135 mmol/L (ref 135–145)

## 2021-05-27 LAB — I-STAT CHEM 8, ED
BUN: 78 mg/dL — ABNORMAL HIGH (ref 6–20)
Calcium, Ion: 0.84 mmol/L — CL (ref 1.15–1.40)
Chloride: 101 mmol/L (ref 98–111)
Creatinine, Ser: >18 mg/dL — ABNORMAL HIGH (ref 0.61–1.24)
Glucose, Bld: 92 mg/dL (ref 70–99)
HCT: 35 % — ABNORMAL LOW (ref 39.0–52.0)
Hemoglobin: 11.9 g/dL — ABNORMAL LOW (ref 13.0–17.0)
Potassium: 6.9 mmol/L (ref 3.5–5.1)
Sodium: 134 mmol/L — ABNORMAL LOW (ref 135–145)
TCO2: 21 mmol/L — ABNORMAL LOW (ref 22–32)

## 2021-05-27 LAB — RENAL FUNCTION PANEL
Albumin: 3.9 g/dL (ref 3.5–5.0)
Anion gap: 20 — ABNORMAL HIGH (ref 5–15)
BUN: 82 mg/dL — ABNORMAL HIGH (ref 6–20)
CO2: 21 mmol/L — ABNORMAL LOW (ref 22–32)
Calcium: 8.2 mg/dL — ABNORMAL LOW (ref 8.9–10.3)
Chloride: 94 mmol/L — ABNORMAL LOW (ref 98–111)
Creatinine, Ser: 17.84 mg/dL — ABNORMAL HIGH (ref 0.61–1.24)
GFR, Estimated: 3 mL/min — ABNORMAL LOW (ref >60)
Glucose, Bld: 71 mg/dL (ref 70–99)
Phosphorus: 1.4 mg/dL — ABNORMAL LOW (ref 2.5–4.6)
Potassium: >7.5 mmol/L (ref 3.5–5.1)
Sodium: 135 mmol/L (ref 135–145)

## 2021-05-27 LAB — CBG MONITORING, ED
Glucose-Capillary: 134 mg/dL — ABNORMAL HIGH (ref 70–99)
Glucose-Capillary: 134 mg/dL — ABNORMAL HIGH (ref 70–99)
Glucose-Capillary: 176 mg/dL — ABNORMAL HIGH (ref 70–99)

## 2021-05-27 LAB — PROTIME-INR
INR: 1 (ref 0.8–1.2)
Prothrombin Time: 12.8 seconds (ref 11.4–15.2)

## 2021-05-27 LAB — RESP PANEL BY RT-PCR (FLU A&B, COVID) ARPGX2
Influenza A by PCR: NEGATIVE
Influenza B by PCR: NEGATIVE
SARS Coronavirus 2 by RT PCR: POSITIVE — AB

## 2021-05-27 LAB — LACTIC ACID, PLASMA: Lactic Acid, Venous: 1.1 mmol/L (ref 0.5–1.9)

## 2021-05-27 MED ORDER — DEXTROSE 50 % IV SOLN
1.0000 | Freq: Once | INTRAVENOUS | Status: AC
  Administered 2021-05-27: 50 mL via INTRAVENOUS
  Filled 2021-05-27: qty 50

## 2021-05-27 MED ORDER — VANCOMYCIN HCL IN DEXTROSE 1-5 GM/200ML-% IV SOLN
1000.0000 mg | Freq: Once | INTRAVENOUS | Status: AC
  Administered 2021-05-27: 1000 mg via INTRAVENOUS
  Filled 2021-05-27: qty 200

## 2021-05-27 MED ORDER — CALCIUM GLUCONATE 10 % IV SOLN
1.0000 g | Freq: Once | INTRAVENOUS | Status: AC
  Administered 2021-05-27: 1 g via INTRAVENOUS
  Filled 2021-05-27: qty 10

## 2021-05-27 MED ORDER — ALBUTEROL SULFATE (2.5 MG/3ML) 0.083% IN NEBU
10.0000 mg | INHALATION_SOLUTION | Freq: Once | RESPIRATORY_TRACT | Status: AC
Start: 2021-05-27 — End: 2021-05-27
  Administered 2021-05-27: 10 mg via RESPIRATORY_TRACT
  Filled 2021-05-27: qty 12

## 2021-05-27 MED ORDER — ACETAMINOPHEN 500 MG PO TABS
1000.0000 mg | ORAL_TABLET | Freq: Once | ORAL | Status: AC
  Administered 2021-05-27: 1000 mg via ORAL
  Filled 2021-05-27: qty 2

## 2021-05-27 MED ORDER — INSULIN ASPART 100 UNIT/ML IV SOLN
5.0000 [IU] | Freq: Once | INTRAVENOUS | Status: DC
Start: 2021-05-27 — End: 2021-05-27
  Filled 2021-05-27: qty 0.05

## 2021-05-27 MED ORDER — SODIUM BICARBONATE 8.4 % IV SOLN
50.0000 meq | Freq: Once | INTRAVENOUS | Status: AC
Start: 2021-05-27 — End: 2021-05-27
  Administered 2021-05-27: 50 meq via INTRAVENOUS
  Filled 2021-05-27: qty 50

## 2021-05-27 MED ORDER — BEBTELOVIMAB 175 MG/2 ML IV (EUA)
175.0000 mg | Freq: Once | INTRAMUSCULAR | Status: AC
  Administered 2021-05-27: 175 mg via INTRAVENOUS
  Filled 2021-05-27: qty 2

## 2021-05-27 MED ORDER — SODIUM CHLORIDE 0.9 % IV SOLN: 100.0000 mL | INTRAVENOUS | Status: DC | PRN

## 2021-05-27 MED ORDER — CHLORHEXIDINE GLUCONATE CLOTH 2 % EX PADS: 6.0000 | MEDICATED_PAD | Freq: Every day | CUTANEOUS | Status: DC

## 2021-05-27 MED ORDER — ACETAMINOPHEN 325 MG PO TABS
650.0000 mg | ORAL_TABLET | Freq: Once | ORAL | Status: DC
  Filled 2021-05-27: qty 2

## 2021-05-27 MED ORDER — ALTEPLASE 2 MG IJ SOLR
2.0000 mg | Freq: Once | INTRAMUSCULAR | Status: DC | PRN
Start: 2021-05-27 — End: 2021-05-28

## 2021-05-27 MED ORDER — DEXTROSE 50 % IV SOLN: 1.0000 | Freq: Once | INTRAVENOUS | Status: DC

## 2021-05-27 MED ORDER — LIDOCAINE HCL (PF) 1 % IJ SOLN: 5.0000 mL | INTRAMUSCULAR | Status: DC | PRN

## 2021-05-27 MED ORDER — ONDANSETRON HCL 4 MG/2ML IJ SOLN
INTRAMUSCULAR | Status: AC
  Administered 2021-05-27: 4 mg via INTRAVENOUS
  Filled 2021-05-27: qty 2

## 2021-05-27 MED ORDER — LIDOCAINE-PRILOCAINE 2.5-2.5 % EX CREA
1.0000 "application " | TOPICAL_CREAM | CUTANEOUS | Status: DC | PRN
  Filled 2021-05-27: qty 5

## 2021-05-27 MED ORDER — SODIUM CHLORIDE 0.9 % IV SOLN
2.0000 g | Freq: Once | INTRAVENOUS | Status: AC
  Administered 2021-05-27: 2 g via INTRAVENOUS
  Filled 2021-05-27: qty 2

## 2021-05-27 MED ORDER — INSULIN ASPART 100 UNIT/ML IV SOLN
5.0000 [IU] | Freq: Once | INTRAVENOUS | Status: AC
  Administered 2021-05-27: 5 [IU] via INTRAVENOUS

## 2021-05-27 MED ORDER — HEPARIN SODIUM (PORCINE) 1000 UNIT/ML DIALYSIS
1000.0000 [IU] | INTRAMUSCULAR | Status: DC | PRN
  Filled 2021-05-27: qty 1

## 2021-05-27 MED ORDER — SODIUM ZIRCONIUM CYCLOSILICATE 10 G PO PACK
10.0000 g | PACK | Freq: Once | ORAL | Status: AC
  Administered 2021-05-27: 10 g via ORAL
  Filled 2021-05-27: qty 1

## 2021-05-27 MED ORDER — INSULIN ASPART 100 UNIT/ML IV SOLN
5.0000 [IU] | Freq: Once | INTRAVENOUS | Status: AC
  Administered 2021-05-27: 5 [IU] via INTRAVENOUS
  Filled 2021-05-27: qty 0.05

## 2021-05-27 MED ORDER — DEXTROSE 50 % IV SOLN
1.0000 | Freq: Once | INTRAVENOUS | Status: AC
Start: 2021-05-27 — End: 2021-05-27
  Administered 2021-05-27: 50 mL via INTRAVENOUS
  Filled 2021-05-27: qty 50

## 2021-05-27 MED ORDER — PENTAFLUOROPROP-TETRAFLUOROETH EX AERO
1.0000 "application " | INHALATION_SPRAY | CUTANEOUS | Status: DC | PRN
  Filled 2021-05-27: qty 116

## 2021-05-27 MED ORDER — ONDANSETRON HCL 4 MG/2ML IJ SOLN: 4.0000 mg | Freq: Once | INTRAMUSCULAR | Status: AC

## 2021-05-27 NOTE — ED Triage Notes (Signed)
Patient BIB EMS for evaluation of fever and chills.  States symptoms started last Friday.  Missed dialysis yesterday because "he didn't feel good."

## 2021-05-27 NOTE — Procedures (Signed)
Asked to see patient for dialysis. Presented w/ fevers and chills.  Does not require admission. Is + for COVID, new onset.  H/o HTN, ESRD on HD, strep sepsis in the past.  K+ was 7.5. Pt rec'd temporizing measures w/ insulin/ glu/ albuterol/ IV Ca. CXR clear, on RA, no SOB. Asked to see for dialysis. Pt seen on HD, no c/o's except for malaise and chills/ fevers. No SOB. 2.5 kg over dry wt. Plan UF 2.5 L , low K+ bath.   Pt is not being admitted, will return to ED after HD for reassessment prior to dc home.  [facility] HD unit will be notified of pt's current COVID+ status.  He should go to his usual HD unit and his usual time.    MWF - [facility]  3.75hrs  2K/ 2.5Ca P4  R thigh AVG  Heparin 1500   I was present at this dialysis session, have reviewed the session itself and made  appropriate changes Kelly Splinter MD [place] pager [phone]   05/27/2021, 12:18 PM

## 2021-05-27 NOTE — ED Notes (Signed)
Report given to [facility].  Transport will be after 7 AM.

## 2021-05-27 NOTE — Discharge Instructions (Addendum)
It was our pleasure to provide your ER care today - we hope that you feel better.  Your covid test is positive. Drink adequate fluids/stay well hydrated. Stay active, take full and deep breaths. Take acetaminophen as need.  See covid info attached.   Follow up for your next dialysis as scheduled.   Return to ER if worse, new symptoms, increased trouble breathing, or other emergency concern.

## 2021-05-27 NOTE — ED Notes (Addendum)
Patient received from [facility]. VS per medic: BP 130/70, HR 80, NSR on monitor, 100% on RA, afebrile. A/A/Ox4, denies pain/discomfort at present. No distress.

## 2021-05-27 NOTE — ED Provider Notes (Signed)
[place] DEPT Provider Note   CSN: [id number] Arrival date & time: 05/27/21  [id number]     History Chief Complaint  Patient presents with   Fever   Chills    [name] is a 50 y.o. male with a history of ESRD on HD (MWF), streptococcal bacteremia, hypertension, secondary hyperparathyroidism, anemia who presents to the emergency department by EMS with a chief complaint of fever.  The patient reports fever, chills, body aches, onset 6 days ago.  He reports that his temperature was 100 F when he was at dialysis 2 days ago.  No known aggravating or alleviating factors.  Unfortunately, he was not dialyzed yesterday due to feeling poorly.  He reports associated nausea mild shortness of breath.  No headache, neck pain or stiffness, sore throat, nasal congestion, chest pain, abdominal pain, back pain, diarrhea, constipation, rash, or pain in the arms and legs.  He does not make urine.  Last dose of Tylenol was earlier tonight.  He is vaccinated against COVID-19, but has not received a booster.  The history is provided by the patient, medical records and the EMS personnel. No language interpreter was used.      Past Medical History:  Diagnosis Date   Anemia    Anxiety    Cough    DRY     Depression    ESRD on hemodialysis ([place])    HD [facility] [place] [facility] MWF   Headache(784.0)    Hypertension    Muscle spasms of neck    BACK, NECK   Pneumonia 2015ish   Thyroid disease     Patient Active Problem List   Diagnosis Date Noted   Chronic viral hepatitis B without delta-agent ([place]) 02/16/2021   Streptococcal bacteremia 02/15/2021   Problem with dialysis access ([place]) 06/15/2018   Hyperkalemia    Problem with vascular access 10/07/2016   Essential hypertension 10/07/2016   Pseudoaneurysm of arteriovenous graft ([place]) [id number]   Complication from renal dialysis device    ESRD (end stage renal disease) on dialysis [facility])    Secondary  hyperparathyroidism of renal origin ([place]) 04/24/2014   Hyperparathyroidism, secondary ([place]) 03/21/2014    Past Surgical History:  Procedure Laterality Date   ARTERIOVENOUS GRAFT PLACEMENT Left    "forearm, it's not working; thigh"    ARTERIOVENOUS GRAFT PLACEMENT Left 11/09/2015   AV FISTULA PLACEMENT Right 01/10/2017   Procedure: INSERTION OF ARTERIOVENOUS Right thigh GORE-TEX GRAFT;  Surgeon: [name], MD;  Location: [place];  Service: Vascular;  Laterality: Right;   FALSE ANEURYSM REPAIR Left 11/09/2015   Procedure: REPAIR OF LEFT FEMORAL PSEUDOANEURYSM; REVISION  OF LEFT THIGH ARTERIOVENOUS GRAFT USING 6MM X 10 CM GORETEX GRAFT ;  Surgeon: [name], MD;  Location: [place];  Service: Vascular;  Laterality: Left;   IR AV DIALY SHUNT INTRO NEEDLE/INTRACATH INITIAL W/PTA/IMG RIGHT Right 02/06/2019   IR AV DIALY SHUNT INTRO NEEDLE/INTRACATH INITIAL W/PTA/IMG RIGHT Right 05/14/2019   IR AV DIALY SHUNT INTRO NEEDLE/INTRACATH INITIAL W/PTA/IMG RIGHT Right 08/18/2020   IR DIALY SHUNT INTRO NEEDLE/INTRACATH INITIAL W/IMG RIGHT Right 09/17/2019   IR DIALY SHUNT INTRO NEEDLE/INTRACATH INITIAL W/IMG RIGHT Right 12/10/2019   IR GENERIC HISTORICAL  07/15/2016   IR US GUIDE VASC ACCESS LEFT 07/15/2016 [name], MD [facility]   IR GENERIC HISTORICAL Left 07/15/2016   IR THROMBECTOMY AV FISTULA W/THROMBOLYSIS/PTA INC/SHUNT/IMG LEFT 07/15/2016 [name], MD [facility]   IR GENERIC HISTORICAL  10/05/2016   IR US GUIDE VASC  ACCESS LEFT 10/05/2016 [name], MD [facility]   IR GENERIC HISTORICAL Left 10/05/2016   IR THROMBECTOMY AV FISTULA W/THROMBOLYSIS/PTA INC/SHUNT/IMG LEFT 10/05/2016 [name], MD [facility]   IR GENERIC HISTORICAL  10/08/2016   IR FLUORO GUIDE CV LINE RIGHT 10/08/2016 [name], MD [facility]   IR GENERIC HISTORICAL  10/08/2016   IR US GUIDE VASC ACCESS RIGHT 10/08/2016 [name], MD [facility]   IR PTA AND STENT ADDL CENTRAL DIALY SEG THRU DIALY CIRCUIT RIGHT  Right 12/10/2019   IR RADIOLOGIST EVAL & MGMT  11/26/2019   IR REMOVAL TUN CV CATH W/O FL  03/16/2017   IR THROMBECTOMY AV FISTULA W/THROMBOLYSIS/PTA INC/SHUNT/IMG RIGHT Right 06/16/2018   IR THROMBECTOMY AV FISTULA W/THROMBOLYSIS/PTA INC/SHUNT/IMG RIGHT Right 06/17/2018   IR US GUIDE VASC ACCESS RIGHT  06/17/2018   IR US GUIDE VASC ACCESS RIGHT  06/16/2018   IR US GUIDE VASC ACCESS RIGHT  02/06/2019   IR US GUIDE VASC ACCESS RIGHT  05/14/2019   IR US GUIDE VASC ACCESS RIGHT  09/17/2019   IR US GUIDE VASC ACCESS RIGHT  12/10/2019   IR US GUIDE VASC ACCESS RIGHT  08/18/2020   PARATHYROIDECTOMY N/A 04/24/2014   Procedure: TOTAL 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;  Surgeon: [name], MD;  Location: [place];  Service: General;  Laterality: N/A;  NECK AND LEFT FOREARM   PERIPHERAL VASCULAR CATHETERIZATION N/A 05/05/2016   Procedure: A/V Shuntogram;  Surgeon: Conrad Summer Shade, MD;  Location: [place] CV LAB;  Service: Cardiovascular;  Laterality: N/A;   PERIPHERAL VASCULAR CATHETERIZATION Left 05/05/2016   Procedure: Peripheral Vascular Balloon Angioplasty;  Surgeon: Conrad [place], MD;  Location: [place] CV LAB;  Service: Cardiovascular;  Laterality: Left;   PSEUDOANEURYSM REPAIR Left 11/09/2015   TEE WITHOUT CARDIOVERSION N/A 02/19/2021   Procedure: TRANSESOPHAGEAL ECHOCARDIOGRAM (TEE);  Surgeon: [name], MD;  Location: [facility] ENDOSCOPY;  Service: Cardiovascular;  Laterality: N/A;   THROMBECTOMY / ARTERIOVENOUS GRAFT REVISION Left 08/18/2015   thigh   THROMBECTOMY AND REVISION OF ARTERIOVENTOUS (AV) GORETEX  GRAFT Left 08/23/2015   Procedure: THROMBECTOMY AND REVISION OF ARTERIOVENTOUS (AV) GORETEX  GRAFT LEFT THIGH ;  Surgeon: [name], MD;  Location: [place];  Service: Vascular;  Laterality: Left;   THROMBECTOMY W/ EMBOLECTOMY Left 08/18/2015   Procedure: THROMBECTOMY  AND REVISION ARTERIOVENOUS GORE-TEX GRAFT/LEFT THIGH;  Surgeon: [name], MD;  Location: [place];   Service: Vascular;  Laterality: Left;   THROMBECTOMY W/ EMBOLECTOMY Right 06/19/2018   Procedure: THROMBECTOMY AND REVISION OF RIGHT THIGH  ARTERIOVENOUS GORE-TEX GRAFT;  Surgeon: [name], MD;  Location: [facility] OR;  Service: Vascular;  Laterality: Right;   UPPER EXTREMITY VENOGRAPHY Bilateral 12/27/2016   Procedure: Upper Extremity Venography;  Surgeon: [name], MD;  Location: [place] CV LAB;  Service: Cardiovascular;  Laterality: Bilateral;   VENOGRAM Left 05/05/2016   Procedure: Venogram;  Surgeon: Conrad [place], MD;  Location: [place] CV LAB;  Service: Cardiovascular;  Laterality: Left;  lower extremity       Family History  Problem Relation Age of Onset   Renal Disease Neg Hx     Social History   Tobacco Use   Smoking status: Former    Types: Cigarettes    Quit date: 03/21/1986    Years since quitting: 35.2   Smokeless tobacco: Never  Substance Use Topics   Alcohol use: No    Alcohol/week: 0.0 standard drinks   Drug use: No  Home Medications Prior to Admission medications   Medication Sig Start Date End Date Taking? Authorizing Provider  acetaminophen (TYLENOL) 500 MG tablet Take 1,000 mg by mouth daily as needed (for pain or headaches).     [provider]  albuterol (VENTOLIN HFA) 108 (90 Base) MCG/ACT inhaler Inhale 1-2 puffs into the lungs every 6 (six) hours as needed for wheezing. 02/09/21   Larene Pickett, PA-C  amLODipine (NORVASC) 10 MG tablet Take 10 mg by mouth daily.    [provider]  B Complex-C-Folic Acid (DIALYVITE TABLET) TABS Take 1 tablet by mouth daily. 06/29/20   [provider]  benzonatate (TESSALON) 100 MG capsule Take 1 capsule (100 mg total) by mouth every 8 (eight) hours. 02/09/21   Larene Pickett, PA-C  hydrALAZINE (APRESOLINE) 50 MG tablet Take 50 mg by mouth 3 (three) times daily. 11/04/20   [provider]  HYDROcodone-acetaminophen (NORCO) 5-325 MG tablet Take 1 tablet by mouth every 4  (four) hours as needed (cough). Patient taking differently: Take 1 tablet by mouth every 4 (four) hours as needed for moderate pain (cough). [id number]   Delora Fuel, MD  lidocaine-prilocaine (EMLA) cream Apply 1 application topically daily as needed (port access).  02/03/20   [provider]  TUMS ULTRA 1000 1000 MG chewable tablet Chew 4,000 mg by mouth 3 (three) times daily with meals. 03/14/20   [provider]  fluticasone (FLONASE) 50 MCG/ACT nasal spray Place 2 sprays into both nostrils daily. Patient not taking: Reported on 06/15/2018 07/25/17 02/20/20  Larene Pickett, PA-C    Allergies    Ivp dye [iodinated diagnostic agents], Lisinopril, and Solu-medrol [methylprednisolone]  Review of Systems   Review of Systems  Constitutional:  Positive for chills and fever. Negative for appetite change.  HENT:  Negative for congestion, sinus pressure, sinus pain, sore throat and voice change.   Eyes:  Negative for visual disturbance.  Respiratory:  Positive for shortness of breath. Negative for cough and wheezing.   Cardiovascular:  Negative for chest pain and leg swelling.  Gastrointestinal:  Positive for nausea. Negative for abdominal pain, blood in stool, constipation, diarrhea and vomiting.  Genitourinary:  Negative for dysuria, flank pain, frequency, hematuria, penile pain, penile swelling and urgency.  Musculoskeletal:  Positive for myalgias. Negative for back pain, neck pain and neck stiffness.  Skin:  Negative for rash.  Allergic/Immunologic: Negative for immunocompromised state.  Neurological:  Negative for dizziness, seizures, syncope, weakness, numbness and headaches.  Psychiatric/Behavioral:  Negative for confusion.    Physical Exam Updated Vital Signs BP 117/73 (BP Location: Right Arm)   Pulse 75   Temp 99.2 F (37.3 C) (Rectal)   Resp 19   SpO2 100%   Physical Exam Vitals and nursing note reviewed.  Constitutional:      General: He is not in acute  distress.    Appearance: He is well-developed. He is not ill-appearing, toxic-appearing or diaphoretic.  HENT:     Head: Normocephalic.  Eyes:     Conjunctiva/sclera: Conjunctivae normal.  Cardiovascular:     Rate and Rhythm: Normal rate and regular rhythm.     Pulses: Normal pulses.     Heart sounds: Normal heart sounds. No murmur heard.   No friction rub. No gallop.  Pulmonary:     Effort: No respiratory distress.     Breath sounds: No stridor. No wheezing, rhonchi or rales.     Comments: Mild tachypnea.  Lungs are clear to auscultation bilaterally. Chest:  Chest wall: No tenderness.  Abdominal:     General: There is no distension.     Palpations: Abdomen is soft. There is no mass.     Tenderness: There is abdominal tenderness. There is no right CVA tenderness, left CVA tenderness, guarding or rebound.     Hernia: No hernia is present.     Comments: Tender to palpation of the bilateral lower abdomen.  No focal tenderness over McBurney's point.  No CVA tenderness bilaterally.  Negative Murphy sign.  Abdomen is soft and nondistended.  Musculoskeletal:     Cervical back: Neck supple.     Right lower leg: No edema.     Left lower leg: No edema.  Skin:    General: Skin is warm and dry.     Capillary Refill: Capillary refill takes less than 2 seconds.     Coloration: Skin is not jaundiced or pale.  Neurological:     General: No focal deficit present.     Mental Status: He is alert.  Psychiatric:        Behavior: Behavior normal.    ED Results / Procedures / Treatments   Labs (all labs ordered are listed, but only abnormal results are displayed) Labs Reviewed  RESP PANEL BY RT-PCR (FLU A&B, COVID) ARPGX2 - Abnormal; Notable for the following components:      Result Value   SARS Coronavirus 2 by RT PCR POSITIVE (*)    All other components within normal limits  CBC WITH DIFFERENTIAL/PLATELET - Abnormal; Notable for the following components:   RDW 17.1 (*)    All other  components within normal limits  COMPREHENSIVE METABOLIC PANEL - Abnormal; Notable for the following components:   Potassium 7.5 (*)    Chloride 96 (*)    BUN 85 (*)    Creatinine, Ser 17.96 (*)    Calcium 8.5 (*)    Total Protein 8.6 (*)    GFR, Estimated 3 (*)    Anion gap 20 (*)    All other components within normal limits  BASIC METABOLIC PANEL - Abnormal; Notable for the following components:   Potassium 6.8 (*)    Chloride 93 (*)    Glucose, Bld 151 (*)    BUN 84 (*)    Creatinine, Ser 17.64 (*)    Calcium 8.1 (*)    GFR, Estimated 3 (*)    Anion gap 20 (*)    All other components within normal limits  I-STAT CHEM 8, ED - Abnormal; Notable for the following components:   Sodium 134 (*)    Potassium 6.9 (*)    BUN 78 (*)    Creatinine, Ser >18.00 (*)    Calcium, Ion 0.84 (*)    TCO2 21 (*)    Hemoglobin 11.9 (*)    HCT 35.0 (*)    All other components within normal limits  CBG MONITORING, ED - Abnormal; Notable for the following components:   Glucose-Capillary 134 (*)    All other components within normal limits  CULTURE, BLOOD (ROUTINE X 2)  CULTURE, BLOOD (ROUTINE X 2)  LACTIC ACID, PLASMA  PROTIME-INR    EKG EKG Interpretation  Date/Time:  Thursday May 27 2021 05:22:54 EDT Ventricular Rate:  68 PR Interval:  175 QRS Duration: 92 QT Interval:  422 QTC Calculation: 449 R Axis:   86 Text Interpretation: Sinus rhythm Probable left atrial enlargement Left ventricular hypertrophy Anterior Q waves, possibly due to LVH persistent peaked twaves Confirmed by Quintella Reichert [phone]) on  05/27/2021 5:25:20 AM  Radiology DG Chest Portable 1 View  Result Date: 05/27/2021 CLINICAL DATA:  50 year old male with fever, chest pain, shortness of breath. EXAM: PORTABLE CHEST 1 VIEW COMPARISON:  CT Chest, Abdomen, and Pelvis today are reported separately. 02/15/2021 and earlier. FINDINGS: Portable AP semi upright view at 0406 hours. Mildly lower lung volumes. Stable  borderline to mild cardiomegaly. Other mediastinal contours are within normal limits. Allowing for portable technique the lungs are clear. No pneumothorax or pleural effusion. Small surgical clips at the thoracic inlet suggesting prior thyroidectomy. No acute osseous abnormality identified. Paucity of bowel gas in the upper abdomen. IMPRESSION: Stable mild cardiomegaly.  No acute cardiopulmonary abnormality. Electronically Signed   By: Genevie Ann M.D.   On: 05/27/2021 04:32    Procedures .Critical Care  Date/Time: 05/27/2021 6:17 AM Performed by: Joanne Gavel, PA-C Authorized by: Joanne Gavel, PA-C   Critical care provider statement:    Critical care time (minutes):  55   Critical care time was exclusive of:  Separately billable procedures and treating other patients and teaching time   Critical care was necessary to treat or prevent imminent or life-threatening deterioration of the following conditions:  Metabolic crisis   Critical care was time spent personally by me on the following activities:  Ordering and performing treatments and interventions, ordering and review of laboratory studies, ordering and review of radiographic studies, pulse oximetry, re-evaluation of patient's condition, obtaining history from patient or surrogate, evaluation of patient's response to treatment, development of treatment plan with patient or surrogate and examination of patient   I assumed direction of critical care for this patient from another provider in my specialty: no     Care discussed with: accepting provider at another facility     Medications Ordered in ED Medications  vancomycin (VANCOCIN) IVPB 1000 mg/200 mL premix (1,000 mg Intravenous New Bag/Given 05/27/21 0619)  Chlorhexidine Gluconate Cloth 2 % PADS 6 each (has no administration in time range)  calcium gluconate inj 10% (1 g) URGENT USE ONLY! (1 g Intravenous Given 05/27/21 0437)  sodium bicarbonate injection 50 mEq (50 mEq Intravenous Given  05/27/21 0442)  albuterol (PROVENTIL) (2.5 MG/3ML) 0.083% nebulizer solution 10 mg (10 mg Nebulization Given 05/27/21 0512)  insulin aspart (novoLOG) injection 5 Units (5 Units Intravenous Given 05/27/21 0455)    And  dextrose 50 % solution 50 mL (50 mLs Intravenous Given 05/27/21 0450)  sodium zirconium cyclosilicate (LOKELMA) packet 10 g (10 g Oral Given 05/27/21 0447)  ceFEPIme (MAXIPIME) 2 g in sodium chloride 0.9 % 100 mL IVPB (0 g Intravenous Stopped 05/27/21 0554)    ED Course  I have reviewed the triage vital signs and the nursing notes.  Pertinent labs & imaging results that were available during my care of the patient were reviewed by me and considered in my medical decision making (see chart for details).  Clinical Course as of 05/27/21 [id number]  Thu May 27, 2021  0418 Spoke with Dr. Posey Pronto, nephrology.  Recommended Lokelma, insulin and dextrose.  He is also in agreement with calcium gluconate, albuterol that has already been ordered. [MM]    Clinical Course User Index [MM] Torianna Junio, Laymond Purser, PA-C   MDM Rules/Calculators/A&P                            50 year old male with a history of ESRD on HD (MWF), streptococcal bacteremia, hypertension, secondary hyperparathyroidism, anemia who presents the emergency department  with a 6-day history of fever, chills, myalgias, nausea, and shortness of breath.  The patient's missed his dialysis session yesterday, but previously was compliant with full treatments on his dialysis schedule.  Mild tachypnea.  Initial oxygen saturations 94%.  Vital signs are otherwise unremarkable.  Rectal temp obtained without fever.  Patient's medical history has been reviewed.  He does have a history of streptococcal bacteremia earlier this year and presented with similar symptoms.  Labs and imaging have been obtained.  The patient has been seen and independently evaluated by Dr. Ralene Bathe, attending physician.  EKG with significantly peaked T waves.  This appears acutely  changed from previous EKGs.  Initial i-STAT with potassium of 6.9, so I am suspicious that EKG changes are due to hyperkalemia, especially in the setting of his dialysis.  Spoke with Dr. Posey Pronto, nephrology, he recommends initiating treatment with [facility], calcium gluconate, albuterol nebulizer, insulin/dextrose, and will see and evaluate the patient once he is [facility].  BMP with potassium of 7.5, but that this did show slight hemolysis.  Repeat metabolic panel was obtained and potassium was 6.8.  Spoke with Dr. Wyvonnia Dusky, ED physician at [facility], who accepts the patient in transfer.  In regards to the patient's infectious symptoms, he has no leukocytosis.  Initial lactate is normal.  He was prophylactically given a dose of vancomycin and cefepime.  However, he does not meet sepsis criteria after his labs are fully resulted.  Symptoms most likely due to positive COVID-19 test today.  Patient will be transferred to [facility] to be evaluated by nephrology for dialysis this a.m.  Final Clinical Impression(s) / ED Diagnoses Final diagnoses:  Transfusion associated circulatory overload  Hyperkalemia  COVID-19    Rx / DC Orders ED Discharge Orders     None        Joanne Gavel, PA-C 05/27/21 Anastasio Champion, MD 05/28/21 1623

## 2021-05-27 NOTE — ED Notes (Signed)
Pt. I-stat Chem 8 results potassium 6.9, Mia, PA made aware.

## 2021-05-27 NOTE — ED Provider Notes (Signed)
Patient previously seen/treated by EDP and has returned to ED room after completion of dialysis. Pt is covid +. Symptoms began 6 days ago. Pt is vaccinated x 2, not boosted. Pt with non prod cough, body aches, fevers.   Pt is breathing comfortably, sats 98% room air. Bp normal. W body aches, subj fever. Acetaminophen po. Po fluids/food. Monoclonal therapy offered - given.   Pt currently appears stable for d/c.   Return precautions provided.      Lajean Saver, MD 05/27/21 1740

## 2021-05-27 NOTE — Progress Notes (Signed)
A consult was received from an ED physician for vancomycin and cefepime per pharmacy dosing.  The patient's profile has been reviewed for ht/wt/allergies/indication/available labs.   A one time order has been placed for vancomycin 1gm and cefepime 2gm.    Further antibiotics/pharmacy consults should be ordered by admitting physician if indicated.                       Thank you, Dolly Rias RPh 05/27/2021, 4:44 AM

## 2021-05-27 NOTE — ED Notes (Addendum)
Pt c/o pain and chills. RN informed Ashok Cordia, MD of patient pain. See MAR orders.

## 2021-05-27 NOTE — ED Notes (Signed)
Patient sitting on bedside commode.  Will give medications once patient is finished

## 2021-05-28 LAB — HEPATITIS B SURFACE ANTIBODY, QUANTITATIVE: Hep B S AB Quant (Post): 3.8 m[IU]/mL — ABNORMAL LOW (ref >9.9)

## 2021-05-29 LAB — HEPATITIS B SURFACE ANTIGEN: Hepatitis B Surface Ag: REACTIVE — AB

## 2021-06-01 LAB — CULTURE, BLOOD (ROUTINE X 2)
Culture: NO GROWTH
Culture: NO GROWTH
Special Requests: ADEQUATE
Special Requests: ADEQUATE

## 2021-06-09 ENCOUNTER — Ambulatory Visit: Payer: Self-pay | Admitting: Internal Medicine

## 2021-06-14 ENCOUNTER — Other Ambulatory Visit (HOSPITAL_COMMUNITY): Payer: Self-pay | Admitting: Nephrology

## 2021-06-14 DIAGNOSIS — N186 End stage renal disease: Secondary | ICD-10-CM

## 2021-06-15 ENCOUNTER — Other Ambulatory Visit: Payer: Self-pay | Admitting: Internal Medicine

## 2021-06-15 MED ORDER — DIPHENHYDRAMINE HCL 50 MG PO TABS: 50.0000 mg | ORAL_TABLET | Freq: Once | ORAL | 0 refills | Status: DC

## 2021-06-15 MED ORDER — PREDNISONE 50 MG PO TABS: ORAL_TABLET | ORAL | 0 refills | Status: DC

## 2021-06-15 NOTE — Progress Notes (Signed)
  Patient is scheduled 06/17/21 for IR procedure. Chart has documented allergy to contrast.  Patients pre-procedure medication, prednisone and benadryl, sent to [facility] on [address].  Attempted to call pt but no answer. Will continue to try to reach pt.

## 2021-06-16 ENCOUNTER — Other Ambulatory Visit: Payer: Self-pay | Admitting: Radiology

## 2021-06-17 ENCOUNTER — Ambulatory Visit (HOSPITAL_COMMUNITY)
Admission: RE | Admit: 2021-06-17 | Discharge: 2021-06-17 | Disposition: A | Discharge status: Home / Self Care | Payer: Self-pay | Source: Ambulatory Visit | Attending: Nephrology | Admitting: Nephrology

## 2021-07-09 ENCOUNTER — Observation Stay (HOSPITAL_COMMUNITY)
Admission: EM | Admit: 2021-07-09 | Discharge: 2021-07-11 | Disposition: A | Discharge status: Home / Self Care | Payer: Self-pay | Attending: Emergency Medicine | Admitting: Emergency Medicine

## 2021-07-09 DIAGNOSIS — Z7952 Long term (current) use of systemic steroids: Secondary | ICD-10-CM | POA: Insufficient documentation

## 2021-07-09 DIAGNOSIS — N186 End stage renal disease: Secondary | ICD-10-CM | POA: Insufficient documentation

## 2021-07-09 DIAGNOSIS — Z91041 Radiographic dye allergy status: Secondary | ICD-10-CM | POA: Insufficient documentation

## 2021-07-09 DIAGNOSIS — I120 Hypertensive chronic kidney disease with stage 5 chronic kidney disease or end stage renal disease: Secondary | ICD-10-CM | POA: Insufficient documentation

## 2021-07-09 DIAGNOSIS — Z79899 Other long term (current) drug therapy: Secondary | ICD-10-CM | POA: Insufficient documentation

## 2021-07-09 DIAGNOSIS — Z87891 Personal history of nicotine dependence: Secondary | ICD-10-CM | POA: Insufficient documentation

## 2021-07-09 DIAGNOSIS — T82868A Thrombosis of vascular prosthetic devices, implants and grafts, initial encounter: Principal | ICD-10-CM | POA: Insufficient documentation

## 2021-07-09 DIAGNOSIS — Z888 Allergy status to other drugs, medicaments and biological substances status: Secondary | ICD-10-CM | POA: Insufficient documentation

## 2021-07-09 DIAGNOSIS — U071 COVID-19: Secondary | ICD-10-CM | POA: Insufficient documentation

## 2021-07-09 DIAGNOSIS — Z992 Dependence on renal dialysis: Secondary | ICD-10-CM | POA: Insufficient documentation

## 2021-07-09 DIAGNOSIS — Y841 Kidney dialysis as the cause of abnormal reaction of the patient, or of later complication, without mention of misadventure at the time of the procedure: Secondary | ICD-10-CM | POA: Insufficient documentation

## 2021-07-09 DIAGNOSIS — I10 Essential (primary) hypertension: Secondary | ICD-10-CM | POA: Diagnosis present

## 2021-07-09 DIAGNOSIS — T82590A Other mechanical complication of surgically created arteriovenous fistula, initial encounter: Secondary | ICD-10-CM | POA: Diagnosis present

## 2021-07-09 LAB — CBC WITH DIFFERENTIAL/PLATELET
Abs Immature Granulocytes: 0.04 10*3/uL (ref 0.00–0.07)
Basophils Absolute: 0 10*3/uL (ref 0.0–0.1)
Basophils Relative: 1 %
Eosinophils Absolute: 0.1 10*3/uL (ref 0.0–0.5)
Eosinophils Relative: 1 %
HCT: 39.9 % (ref 39.0–52.0)
Hemoglobin: 13 g/dL (ref 13.0–17.0)
Immature Granulocytes: 1 %
Lymphocytes Relative: 16 %
Lymphs Abs: 1.4 10*3/uL (ref 0.7–4.0)
MCH: 31 pg (ref 26.0–34.0)
MCHC: 32.6 g/dL (ref 30.0–36.0)
MCV: 95 fL (ref 80.0–100.0)
Monocytes Absolute: 1 10*3/uL (ref 0.1–1.0)
Monocytes Relative: 12 %
Neutro Abs: 6.2 10*3/uL (ref 1.7–7.7)
Neutrophils Relative %: 69 %
Platelets: 170 10*3/uL (ref 150–400)
RBC: 4.2 MIL/uL — ABNORMAL LOW (ref 4.22–5.81)
RDW: 17 % — ABNORMAL HIGH (ref 11.5–15.5)
WBC: 8.8 10*3/uL (ref 4.0–10.5)
nRBC: 0 % (ref 0.0–0.2)

## 2021-07-09 LAB — BASIC METABOLIC PANEL
Anion gap: 19 — ABNORMAL HIGH (ref 5–15)
BUN: 85 mg/dL — ABNORMAL HIGH (ref 6–20)
CO2: 23 mmol/L (ref 22–32)
Calcium: 8.8 mg/dL — ABNORMAL LOW (ref 8.9–10.3)
Chloride: 90 mmol/L — ABNORMAL LOW (ref 98–111)
Creatinine, Ser: 16.9 mg/dL — ABNORMAL HIGH (ref 0.61–1.24)
GFR, Estimated: 3 mL/min — ABNORMAL LOW (ref >60)
Glucose, Bld: 85 mg/dL (ref 70–99)
Potassium: 4.3 mmol/L (ref 3.5–5.1)
Sodium: 132 mmol/L — ABNORMAL LOW (ref 135–145)

## 2021-07-09 NOTE — ED Triage Notes (Signed)
Pt reports dialysis clogged yesterday to right leg. Pt unable to receive dialysis today. Last full treatment Wednesday. Fistula with no thrill. No acute distress noted.

## 2021-07-09 NOTE — ED Provider Notes (Signed)
Emergency Medicine Provider Triage Evaluation Note  Va Chukwu , a 50 y.o. male  was evaluated in triage.  Pt complains of vascular access problem.  Patient is at end-stage renal disease on hemodialysis.  He has a graft in his right leg.  Patient had full dialysis session this past Wednesday however went today and told it was not working.  Review of Systems  Positive: Vascular access Negative: Fever   Physical Exam  BP 125/88 (BP Location: Right Arm)   Pulse 71   Temp 98.5 F (36.9 C) (Oral)   Resp 16   SpO2 100%  Gen:   Awake, no distress   Resp:  Normal effort  MSK:   Moves extremities without difficulty  Other:  Dialysis access in the right upper extremity without palpable thrill or audible bruit  Medical Decision Making  Medically screening exam initiated at 12:01 PM.  Appropriate orders placed.  Jose Abdou Zaragoza was informed that the remainder of the evaluation will be completed by another provider, this initial triage assessment does not replace that evaluation, and the importance of remaining in the ED until their evaluation is complete.  Patient with failed dialysis access.  Labs ordered.   Margarita Mail, PA-C 07/09/21 1202    Regan Lemming, MD 07/09/21 1940

## 2021-07-10 ENCOUNTER — Observation Stay (HOSPITAL_COMMUNITY): Payer: Self-pay

## 2021-07-10 ENCOUNTER — Other Ambulatory Visit: Payer: Self-pay

## 2021-07-10 ENCOUNTER — Encounter (HOSPITAL_COMMUNITY): Payer: Self-pay | Admitting: Internal Medicine

## 2021-07-10 DIAGNOSIS — U071 COVID-19: Secondary | ICD-10-CM | POA: Diagnosis present

## 2021-07-10 DIAGNOSIS — T82590A Other mechanical complication of surgically created arteriovenous fistula, initial encounter: Secondary | ICD-10-CM

## 2021-07-10 HISTORY — PX: IR FLUORO GUIDE CV LINE RIGHT: IMG2283

## 2021-07-10 HISTORY — PX: IR US GUIDE VASC ACCESS RIGHT: IMG2390

## 2021-07-10 LAB — RESP PANEL BY RT-PCR (FLU A&B, COVID) ARPGX2
Influenza A by PCR: NEGATIVE
Influenza B by PCR: NEGATIVE
SARS Coronavirus 2 by RT PCR: POSITIVE — AB

## 2021-07-10 LAB — PROCALCITONIN: Procalcitonin: 2.15 ng/mL

## 2021-07-10 LAB — FIBRINOGEN: Fibrinogen: 583 mg/dL — ABNORMAL HIGH (ref 210–475)

## 2021-07-10 LAB — C-REACTIVE PROTEIN: CRP: 12.4 mg/dL — ABNORMAL HIGH (ref <1.0)

## 2021-07-10 LAB — LACTATE DEHYDROGENASE: LDH: 184 U/L (ref 98–192)

## 2021-07-10 LAB — D-DIMER, QUANTITATIVE: D-Dimer, Quant: 1.67 ug/mL-FEU — ABNORMAL HIGH (ref 0.00–0.50)

## 2021-07-10 LAB — FERRITIN: Ferritin: 1358 ng/mL — ABNORMAL HIGH (ref 24–336)

## 2021-07-10 MED ORDER — AMLODIPINE BESYLATE 10 MG PO TABS
10.0000 mg | ORAL_TABLET | Freq: Every day | ORAL | Status: DC
  Administered 2021-07-10: 10 mg via ORAL
  Filled 2021-07-10: qty 1

## 2021-07-10 MED ORDER — SODIUM CHLORIDE 0.9 % IV SOLN: INTRAVENOUS | Status: AC | PRN

## 2021-07-10 MED ORDER — DIPHENHYDRAMINE HCL 50 MG/ML IJ SOLN: 50.0000 mg | Freq: Once | INTRAMUSCULAR | Status: DC | PRN

## 2021-07-10 MED ORDER — FAMOTIDINE IN NACL 20-0.9 MG/50ML-% IV SOLN
20.0000 mg | Freq: Once | INTRAVENOUS | Status: DC | PRN
  Filled 2021-07-10: qty 50

## 2021-07-10 MED ORDER — MIDAZOLAM HCL 2 MG/2ML IJ SOLN
INTRAMUSCULAR | Status: DC | PRN
  Administered 2021-07-10 (×2): .5 mg via INTRAVENOUS

## 2021-07-10 MED ORDER — NIRMATRELVIR/RITONAVIR (PAXLOVID) TABLET (RENAL DOSING): 2.0000 | ORAL_TABLET | Freq: Two times a day (BID) | ORAL | Status: DC

## 2021-07-10 MED ORDER — ACETAMINOPHEN 325 MG PO TABS
650.0000 mg | ORAL_TABLET | Freq: Four times a day (QID) | ORAL | Status: DC | PRN
  Administered 2021-07-10 – 2021-07-11 (×2): 650 mg via ORAL
  Filled 2021-07-10 (×3): qty 2

## 2021-07-10 MED ORDER — EPINEPHRINE 0.3 MG/0.3ML IJ SOAJ
0.3000 mg | Freq: Once | INTRAMUSCULAR | Status: DC | PRN
  Filled 2021-07-10 (×2): qty 0.6

## 2021-07-10 MED ORDER — ONDANSETRON HCL 4 MG PO TABS: 4.0000 mg | ORAL_TABLET | Freq: Four times a day (QID) | ORAL | Status: DC | PRN

## 2021-07-10 MED ORDER — CALCIUM CARBONATE ANTACID 1250 MG/5ML PO SUSP
500.0000 mg | Freq: Four times a day (QID) | ORAL | Status: DC | PRN
  Filled 2021-07-10: qty 5

## 2021-07-10 MED ORDER — DOCUSATE SODIUM 283 MG RE ENEM
1.0000 | ENEMA | RECTAL | Status: DC | PRN
  Filled 2021-07-10: qty 1

## 2021-07-10 MED ORDER — CAMPHOR-MENTHOL 0.5-0.5 % EX LOTN
1.0000 "application " | TOPICAL_LOTION | Freq: Three times a day (TID) | CUTANEOUS | Status: DC | PRN
  Filled 2021-07-10: qty 222

## 2021-07-10 MED ORDER — METHYLPREDNISOLONE SODIUM SUCC 125 MG IJ SOLR: 125.0000 mg | Freq: Once | INTRAMUSCULAR | Status: AC | PRN

## 2021-07-10 MED ORDER — CHLORHEXIDINE GLUCONATE CLOTH 2 % EX PADS
6.0000 | MEDICATED_PAD | Freq: Every day | CUTANEOUS | Status: DC
  Administered 2021-07-11: 6 via TOPICAL

## 2021-07-10 MED ORDER — HYDRALAZINE HCL 50 MG PO TABS
50.0000 mg | ORAL_TABLET | Freq: Three times a day (TID) | ORAL | Status: DC
  Filled 2021-07-10: qty 2
  Filled 2021-07-10 (×2): qty 1

## 2021-07-10 MED ORDER — ACETAMINOPHEN 650 MG RE SUPP: 650.0000 mg | Freq: Four times a day (QID) | RECTAL | Status: DC | PRN

## 2021-07-10 MED ORDER — HYDROXYZINE HCL 25 MG PO TABS: 25.0000 mg | ORAL_TABLET | Freq: Three times a day (TID) | ORAL | Status: DC | PRN

## 2021-07-10 MED ORDER — CEFAZOLIN SODIUM-DEXTROSE 2-4 GM/100ML-% IV SOLN
INTRAVENOUS | Status: AC
  Filled 2021-07-10: qty 100

## 2021-07-10 MED ORDER — HEPARIN SODIUM (PORCINE) 1000 UNIT/ML IJ SOLN
INTRAMUSCULAR | Status: AC
  Filled 2021-07-10: qty 4

## 2021-07-10 MED ORDER — CEFAZOLIN SODIUM-DEXTROSE 2-4 GM/100ML-% IV SOLN
2.0000 g | INTRAVENOUS | Status: AC
  Administered 2021-07-10: 2 g via INTRAVENOUS

## 2021-07-10 MED ORDER — LIDOCAINE-PRILOCAINE 2.5-2.5 % EX CREA
1.0000 "application " | TOPICAL_CREAM | Freq: Every day | CUTANEOUS | Status: DC | PRN
  Filled 2021-07-10: qty 5

## 2021-07-10 MED ORDER — LIDOCAINE HCL 1 % IJ SOLN
INTRAMUSCULAR | Status: AC
  Administered 2021-07-10: 10 mL
  Filled 2021-07-10: qty 20

## 2021-07-10 MED ORDER — ONDANSETRON HCL 4 MG/2ML IJ SOLN: 4.0000 mg | Freq: Four times a day (QID) | INTRAMUSCULAR | Status: DC | PRN

## 2021-07-10 MED ORDER — HYDRALAZINE HCL 20 MG/ML IJ SOLN: 5.0000 mg | INTRAMUSCULAR | Status: DC | PRN

## 2021-07-10 MED ORDER — NEPRO/CARBSTEADY PO LIQD
237.0000 mL | Freq: Three times a day (TID) | ORAL | Status: DC | PRN
  Filled 2021-07-10: qty 237

## 2021-07-10 MED ORDER — SORBITOL 70 % SOLN
30.0000 mL | Status: DC | PRN
  Filled 2021-07-10: qty 30

## 2021-07-10 MED ORDER — GELATIN ABSORBABLE 12-7 MM EX MISC
CUTANEOUS | Status: AC
  Filled 2021-07-10: qty 1

## 2021-07-10 MED ORDER — HEPARIN SODIUM (PORCINE) 1000 UNIT/ML IJ SOLN
INTRAMUSCULAR | Status: AC
  Administered 2021-07-10: 3.8 mL
  Filled 2021-07-10: qty 1

## 2021-07-10 MED ORDER — BEBTELOVIMAB 175 MG/2 ML IV (EUA)
175.0000 mg | Freq: Once | INTRAMUSCULAR | Status: DC
  Filled 2021-07-10: qty 2

## 2021-07-10 MED ORDER — MIDAZOLAM HCL 2 MG/2ML IJ SOLN
INTRAMUSCULAR | Status: AC
  Filled 2021-07-10: qty 2

## 2021-07-10 MED ORDER — ALBUTEROL SULFATE HFA 108 (90 BASE) MCG/ACT IN AERS
2.0000 | INHALATION_SPRAY | Freq: Once | RESPIRATORY_TRACT | Status: DC | PRN
  Filled 2021-07-10: qty 6.7

## 2021-07-10 MED ORDER — ZOLPIDEM TARTRATE 5 MG PO TABS: 5.0000 mg | ORAL_TABLET | Freq: Every evening | ORAL | Status: DC | PRN

## 2021-07-10 MED ORDER — FENTANYL CITRATE (PF) 100 MCG/2ML IJ SOLN
INTRAMUSCULAR | Status: DC | PRN
  Administered 2021-07-10: 25 ug via INTRAVENOUS

## 2021-07-10 MED ORDER — FENTANYL CITRATE (PF) 100 MCG/2ML IJ SOLN
INTRAMUSCULAR | Status: AC
  Filled 2021-07-10: qty 2

## 2021-07-10 MED ORDER — HEPARIN SODIUM (PORCINE) 5000 UNIT/ML IJ SOLN
5000.0000 [IU] | Freq: Three times a day (TID) | INTRAMUSCULAR | Status: DC
  Administered 2021-07-10: 5000 [IU] via SUBCUTANEOUS
  Filled 2021-07-10: qty 1

## 2021-07-10 MED ORDER — ALBUTEROL SULFATE (2.5 MG/3ML) 0.083% IN NEBU: 3.0000 mL | INHALATION_SOLUTION | Freq: Four times a day (QID) | RESPIRATORY_TRACT | Status: DC | PRN

## 2021-07-10 MED ORDER — HYDROMORPHONE HCL 1 MG/ML IJ SOLN
1.0000 mg | INTRAMUSCULAR | Status: DC | PRN
  Administered 2021-07-10: 1 mg via INTRAVENOUS
  Filled 2021-07-10: qty 1

## 2021-07-10 NOTE — ED Notes (Signed)
Patient not in assigned room. Pt in IR. No report received on this patient at this time.

## 2021-07-10 NOTE — H&P (Addendum)
H&P    [name] [id number] DOB: 1970/10/17 DOA: 07/09/2021  PCP: Patient, No Pcp Per (Inactive) Consultants:  Juleen China - ID; nephrology; IR Patient coming from:  Home; NOK:  Martie Round, [phone]  Chief Complaint: Vascular access issue  HPI: [name] is a 50 y.o. male with medical history significant of HTN and ESRD on MWF HD presenting with vascular access problem.  He reports having a regular and full HD session on Wednesday.  Thursday, he noted R thigh fistula malfunction as well as fever and malaise.  He feels better today but continues to feel nauseated and mild malaise.  He has no other HD access and so called nephrology and was told to come in to the ER for fistula repair.    ED Course: HD catheter clogged.  Vascular said no, IR will address later today.  Review of Systems: As per HPI; otherwise review of systems reviewed and negative.   Ambulatory Status:  Ambulates without assistance   Past Medical History:  Diagnosis Date   Anemia    Anxiety    Depression    ESRD on hemodialysis ([place])    HD [place] MWF   Hypertension    Thyroid disease     Past Surgical History:  Procedure Laterality Date   ARTERIOVENOUS GRAFT PLACEMENT Left    "forearm, it's not working; thigh"    ARTERIOVENOUS GRAFT PLACEMENT Left 11/09/2015   AV FISTULA PLACEMENT Right 01/10/2017   Procedure: INSERTION OF ARTERIOVENOUS Right thigh GORE-TEX GRAFT;  Surgeon: [name], MD;  Location: [place];  Service: Vascular;  Laterality: Right;   FALSE ANEURYSM REPAIR Left 11/09/2015   Procedure: REPAIR OF LEFT FEMORAL PSEUDOANEURYSM; REVISION  OF LEFT THIGH ARTERIOVENOUS GRAFT USING 6MM X 10 CM GORETEX GRAFT ;  Surgeon: [name], MD;  Location: [place];  Service: Vascular;  Laterality: Left;   IR AV DIALY SHUNT INTRO NEEDLE/INTRACATH INITIAL W/PTA/IMG RIGHT Right 02/06/2019   IR AV DIALY SHUNT INTRO NEEDLE/INTRACATH INITIAL W/PTA/IMG RIGHT Right  05/14/2019   IR AV DIALY SHUNT INTRO NEEDLE/INTRACATH INITIAL W/PTA/IMG RIGHT Right 08/18/2020   IR DIALY SHUNT INTRO NEEDLE/INTRACATH INITIAL W/IMG RIGHT Right 09/17/2019   IR DIALY SHUNT INTRO NEEDLE/INTRACATH INITIAL W/IMG RIGHT Right 12/10/2019   IR GENERIC HISTORICAL  07/15/2016   IR US GUIDE VASC ACCESS LEFT 07/15/2016 [name], MD [facility]   IR GENERIC HISTORICAL Left 07/15/2016   IR THROMBECTOMY AV FISTULA W/THROMBOLYSIS/PTA INC/SHUNT/IMG LEFT 07/15/2016 [name], MD [facility]   IR GENERIC HISTORICAL  10/05/2016   IR US GUIDE VASC ACCESS LEFT 10/05/2016 [name], MD [facility]   IR GENERIC HISTORICAL Left 10/05/2016   IR THROMBECTOMY AV FISTULA W/THROMBOLYSIS/PTA INC/SHUNT/IMG LEFT 10/05/2016 [name], MD [facility]   IR GENERIC HISTORICAL  10/08/2016   IR FLUORO GUIDE CV LINE RIGHT 10/08/2016 [name], MD [facility]   IR GENERIC HISTORICAL  10/08/2016   IR US GUIDE VASC ACCESS RIGHT 10/08/2016 [name], MD [facility]   IR PTA AND STENT ADDL CENTRAL DIALY SEG THRU DIALY CIRCUIT RIGHT Right 12/10/2019   IR RADIOLOGIST EVAL & MGMT  11/26/2019   IR REMOVAL TUN CV CATH W/O FL  03/16/2017   IR THROMBECTOMY AV FISTULA W/THROMBOLYSIS/PTA INC/SHUNT/IMG RIGHT Right 06/16/2018   IR THROMBECTOMY AV FISTULA W/THROMBOLYSIS/PTA INC/SHUNT/IMG RIGHT Right 06/17/2018   IR US GUIDE VASC ACCESS RIGHT  06/17/2018   IR US GUIDE VASC ACCESS RIGHT  06/16/2018   IR US GUIDE VASC  ACCESS RIGHT  02/06/2019   IR US GUIDE VASC ACCESS RIGHT  05/14/2019   IR US GUIDE VASC ACCESS RIGHT  09/17/2019   IR US GUIDE VASC ACCESS RIGHT  12/10/2019   IR US GUIDE VASC ACCESS RIGHT  08/18/2020   PARATHYROIDECTOMY N/A 04/24/2014   Procedure: TOTAL 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;  Surgeon: [name], MD;  Location: [place];  Service: General;  Laterality: N/A;  NECK AND LEFT FOREARM   PERIPHERAL VASCULAR CATHETERIZATION N/A 05/05/2016   Procedure: A/V Shuntogram;  Surgeon: Conrad Pesotum, MD;   Location: [place] [facility];  Service: Cardiovascular;  Laterality: N/A;   PERIPHERAL VASCULAR CATHETERIZATION Left 05/05/2016   Procedure: Peripheral Vascular Balloon Angioplasty;  Surgeon: Conrad Alcan Border, MD;  Location: [place] [facility];  Service: Cardiovascular;  Laterality: Left;   PSEUDOANEURYSM REPAIR Left 11/09/2015   TEE WITHOUT CARDIOVERSION N/A 02/19/2021   Procedure: TRANSESOPHAGEAL ECHOCARDIOGRAM (TEE);  Surgeon: [name], MD;  Location: [facility] ENDOSCOPY;  Service: Cardiovascular;  Laterality: N/A;   THROMBECTOMY / ARTERIOVENOUS GRAFT REVISION Left 08/18/2015   thigh   THROMBECTOMY AND REVISION OF ARTERIOVENTOUS (AV) GORETEX  GRAFT Left 08/23/2015   Procedure: THROMBECTOMY AND REVISION OF ARTERIOVENTOUS (AV) GORETEX  GRAFT LEFT THIGH ;  Surgeon: [name], MD;  Location: [place];  Service: Vascular;  Laterality: Left;   THROMBECTOMY W/ EMBOLECTOMY Left 08/18/2015   Procedure: THROMBECTOMY  AND REVISION ARTERIOVENOUS GORE-TEX GRAFT/LEFT THIGH;  Surgeon: [name], MD;  Location: [place];  Service: Vascular;  Laterality: Left;   THROMBECTOMY W/ EMBOLECTOMY Right 06/19/2018   Procedure: THROMBECTOMY AND REVISION OF RIGHT THIGH  ARTERIOVENOUS GORE-TEX GRAFT;  Surgeon: [name], MD;  Location: [facility] OR;  Service: Vascular;  Laterality: Right;   UPPER EXTREMITY VENOGRAPHY Bilateral 12/27/2016   Procedure: Upper Extremity Venography;  Surgeon: [name], MD;  Location: [place] [facility];  Service: Cardiovascular;  Laterality: Bilateral;   VENOGRAM Left 05/05/2016   Procedure: Venogram;  Surgeon: Conrad Evergreen, MD;  Location: [place] [facility];  Service: Cardiovascular;  Laterality: Left;  lower extremity    Social History   Socioeconomic History   Marital status: Married    Spouse name: Not on file   Number of children: Not on file   Years of education: Not on file   Highest education level: Not on file  Occupational History   Not on file  Tobacco Use    Smoking status: Former    Types: Cigarettes    Quit date: 03/21/1986    Years since quitting: 35.3   Smokeless tobacco: Never  Substance and Sexual Activity   Alcohol use: No    Alcohol/week: 0.0 standard drinks   Drug use: No   Sexual activity: Not on file  Other Topics Concern   Not on file  Social History Narrative   Not on file   Social Determinants of Health   Financial Resource Strain: Not on file  Food Insecurity: Not on file  Transportation Needs: Not on file  Physical Activity: Not on file  Stress: Not on file  Social Connections: Not on file  Intimate Partner Violence: Not on file    Allergies  Allergen Reactions   Ivp Dye [Iodinated Diagnostic Agents] Swelling    SWELLING REACTION UNSPECIFIED    Lisinopril Cough   Solu-Medrol [Methylprednisolone] Nausea And Vomiting    Family History  Problem Relation Age of Onset   Renal Disease Neg Hx  Prior to Admission medications   Medication Sig Start Date End Date Taking? Authorizing Provider  acetaminophen (TYLENOL) 500 MG tablet Take 1,000 mg by mouth daily as needed (for pain or headaches).    Yes [provider]  albuterol (VENTOLIN HFA) 108 (90 Base) MCG/ACT inhaler Inhale 1-2 puffs into the lungs every 6 (six) hours as needed for wheezing. 02/09/21  Yes Larene Pickett, PA-C  amLODipine (NORVASC) 10 MG tablet Take 10 mg by mouth at bedtime.   Yes [provider]  B Complex-C-Folic Acid (DIALYVITE TABLET) TABS Take 1 tablet by mouth daily. 06/29/20  Yes [provider]  cloNIDine (CATAPRES) 0.2 MG tablet Take 0.1 mg by mouth 3 (three) times daily.   Yes [provider]  lidocaine-prilocaine (EMLA) cream Apply 1 application topically daily as needed (port access).  02/03/20  Yes [provider]  TUMS ULTRA 1000 1000 MG chewable tablet Chew 4,000 mg by mouth 3 (three) times daily with meals. 03/14/20  Yes [provider]  benzonatate (TESSALON) 100 MG capsule  Take 1 capsule (100 mg total) by mouth every 8 (eight) hours. Patient not taking: Reported on 07/10/2021 02/09/21   Larene Pickett, PA-C  hydrALAZINE (APRESOLINE) 50 MG tablet Take 50 mg by mouth 3 (three) times daily. Patient not taking: Reported on 07/10/2021 11/04/20   [provider]  fluticasone (FLONASE) 50 MCG/ACT nasal spray Place 2 sprays into both nostrils daily. Patient not taking: Reported on 06/15/2018 07/25/17 02/20/20  Larene Pickett, PA-C    Physical Exam: Vitals:   07/10/21 0220 07/10/21 0515 07/10/21 0745 07/10/21 0931  BP: (!) 144/87 (!) 128/94 (!) 134/97 (!) 142/93  Pulse: (!) 57 (!) 56 (!) 59 64  Resp: '16 15 14 20  '$ Temp:   97.8 F (36.6 C)   TempSrc:   Oral   SpO2: 100% 100% 100% 100%  Weight:      Height:         General:  Appears calm and comfortable and is in NAD Eyes:  EOMI, normal lids, iris ENT:  grossly normal hearing, lips & tongue, mmm Neck:  no LAD, masses or thyromegaly Cardiovascular:  RRR, no m/r/g. No LE edema.  Respiratory:   CTA bilaterally with no wheezes/rales/rhonchi.  Normal respiratory effort. Abdomen:  soft, NT, ND Skin:  no rash or induration seen on limited exam Musculoskeletal:  grossly normal tone BUE/BLE, good ROM, no bony abnormality Psychiatric:  grossly normal mood and affect, speech fluent and appropriate, AOx3 Neurologic:  CN 2-12 grossly intact, moves all extremities in coordinated fashion    Radiological Exams on Admission: Independently reviewed - see discussion in A/P where applicable  No results found.  EKG: Independently reviewed.  Sinus bradycardia with rate 55; NSCSLT   Labs on Admission: I have personally reviewed the available labs and imaging studies at the time of the admission.  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 tomorrow  ESRD on HD -Patient on chronic MWF HD -Nephrology prn order set utilized -Nephrology notified that patient will need HD once access is restored  COVID -Patient with reported 1 day of recent fever, malaise, nausea -No O2 requirement -COVID POSITIVE - but he was also positive on 8/25 and so should not have been retested since he is in the 90-day window and should not require isolation -The patient has comorbidities which may increase the risk for ARDS/MODS including:  HTN, CKD -COVID labs are positive for inflammation -MAB therapy ordered given +COVID test for incidental/minimally symptomatic COVID infection but order was canceled prior to administration  HTN  -Continue Norvasc -Has only been taking clonidine once daily due to reported hypotension, so will hold for now -Resume hydralazine    Initial plan was for overnight observation.  However, it now appears that this is no longer needed since this does not appear to be active COVID infection; he had TDC placed; and he can be treated with HD and discharged to home today.   DVT prophylaxis:  Heparin Code Status:  Full - confirmed with patient Family Communication: None present  Disposition Plan:  Home once clinically improved Consults called: Nephrology; IR Admission status: It is my clinical opinion that referral for OBSERVATION is reasonable and necessary in this patient based on the above information provided. The aforementioned taken together are felt to place the patient at high  risk for further clinical deterioration. However it is anticipated that the patient may be medically stable for discharge from the hospital within 24 to 48 hours.       Karmen Bongo MD [facility]   How to contact the [facility] Attending or Consulting provider [place] or covering provider during after hours [place], for this patient?  Check the care team in [facility] and look for a) attending/consulting TR[facility] provider listed and b) the [facility] team listed Log into [email or web address] and use [place]'s universal password to access. If you do not have the password, please contact the hospital operator. Locate the [facility] provider you are looking for under [facility] and page to a number that you can be directly reached. If you still have difficulty reaching the provider, please page the [facility] (Director on Call) for the Hospitalists listed on amion for assistance.   07/10/2021, 10:17 AM

## 2021-07-10 NOTE — ED Notes (Signed)
Last ate and drank "yesterday"

## 2021-07-10 NOTE — ED Notes (Signed)
Pt transported to IR 

## 2021-07-10 NOTE — ED Provider Notes (Addendum)
  12M presenting with clotted vascular access. IR consulted and plans intervention later today. EKG with peaked T waves, chronic, no hyperkalemia on labs. Hospitalist medicine consulted for admission while awaiting IR intervention.  8:13AM Spoke with Dr. Lorin Mercy regarding admission vs discharge and IR follow-up outpatient. Dr. Lorin Mercy plans to discuss options with Dr. Earleen Newport of IR.   Given lack of vascular access, will admit. COVID resulted positive.       Regan Lemming, MD 07/10/21 1005

## 2021-07-10 NOTE — ED Provider Notes (Signed)
[facility] EMERGENCY DEPARTMENT Provider Note   CSN: [id number] Arrival date & time: 07/09/21  1116     History Chief Complaint  Patient presents with   Vascular Access Problem    [name] is a 50 y.o. male.  Patient with history of ESRD on dialysis presenting with his access not functioning.  States he goes to dialysis Monday, Wednesday and Friday.  His last dialysis session was Wednesday, October 5.  States he noticed the graft not functioning on Thursday and he was told to come to the hospital yesterday by his dialysis center.  He was not dialyzed on Friday.  He has some pain in the leg.  He denies any difficulty breathing or chest pain.  No cough, fever, abdominal pain, nausea or vomiting.  No chest pain or shortness of breath  The history is provided by the patient.      Past Medical History:  Diagnosis Date   Anemia    Anxiety    Cough    DRY     Depression    ESRD on hemodialysis ([place])    HD [facility] MWF   Headache(784.0)    Hypertension    Muscle spasms of neck    BACK, NECK   Pneumonia 2015ish   Thyroid disease     Patient Active Problem List   Diagnosis Date Noted   Chronic viral hepatitis B without delta-agent ([place]) 02/16/2021   Streptococcal bacteremia 02/15/2021   Problem with dialysis access ([place]) 06/15/2018   Hyperkalemia    Problem with vascular access 10/07/2016   Essential hypertension 10/07/2016   Pseudoaneurysm of arteriovenous graft ([place]) [id number]   Complication from renal dialysis device    ESRD (end stage renal disease) on dialysis [facility])    Secondary hyperparathyroidism of renal origin ([place]) 04/24/2014   Hyperparathyroidism, secondary ([place]) 03/21/2014    Past Surgical History:  Procedure Laterality Date   ARTERIOVENOUS GRAFT PLACEMENT Left    "forearm, it's not working; thigh"    ARTERIOVENOUS GRAFT PLACEMENT Left 11/09/2015   AV FISTULA PLACEMENT Right 01/10/2017   Procedure: INSERTION OF  ARTERIOVENOUS Right thigh GORE-TEX GRAFT;  Surgeon: [name], MD;  Location: [facility];  Service: Vascular;  Laterality: Right;   FALSE ANEURYSM REPAIR Left 11/09/2015   Procedure: REPAIR OF LEFT FEMORAL PSEUDOANEURYSM; REVISION  OF LEFT THIGH ARTERIOVENOUS GRAFT USING 6MM X 10 CM GORETEX GRAFT ;  Surgeon: [name], MD;  Location: [place];  Service: Vascular;  Laterality: Left;   IR AV DIALY SHUNT INTRO NEEDLE/INTRACATH INITIAL W/PTA/IMG RIGHT Right 02/06/2019   IR AV DIALY SHUNT INTRO NEEDLE/INTRACATH INITIAL W/PTA/IMG RIGHT Right 05/14/2019   IR AV DIALY SHUNT INTRO NEEDLE/INTRACATH INITIAL W/PTA/IMG RIGHT Right 08/18/2020   IR DIALY SHUNT INTRO NEEDLE/INTRACATH INITIAL W/IMG RIGHT Right 09/17/2019   IR DIALY SHUNT INTRO NEEDLE/INTRACATH INITIAL W/IMG RIGHT Right 12/10/2019   IR GENERIC HISTORICAL  07/15/2016   IR US GUIDE VASC ACCESS LEFT 07/15/2016 [name], MD [facility]   IR GENERIC HISTORICAL Left 07/15/2016   IR THROMBECTOMY AV FISTULA W/THROMBOLYSIS/PTA INC/SHUNT/IMG LEFT 07/15/2016 [name], MD [facility]   IR GENERIC HISTORICAL  10/05/2016   IR US GUIDE VASC ACCESS LEFT 10/05/2016 [name], MD [facility]   IR GENERIC HISTORICAL Left 10/05/2016   IR THROMBECTOMY AV FISTULA W/THROMBOLYSIS/PTA INC/SHUNT/IMG LEFT 10/05/2016 [name], MD [facility]   IR GENERIC HISTORICAL  10/08/2016   IR FLUORO GUIDE CV LINE RIGHT 10/08/2016 [name], MD [facility]  [facility]   IR GENERIC HISTORICAL  10/08/2016   IR US GUIDE VASC ACCESS RIGHT 10/08/2016 [name], MD [facility]   IR PTA AND STENT ADDL CENTRAL DIALY SEG THRU DIALY CIRCUIT RIGHT Right 12/10/2019   IR RADIOLOGIST EVAL & MGMT  11/26/2019   IR REMOVAL TUN CV CATH W/O FL  03/16/2017   IR THROMBECTOMY AV FISTULA W/THROMBOLYSIS/PTA INC/SHUNT/IMG RIGHT Right 06/16/2018   IR THROMBECTOMY AV FISTULA W/THROMBOLYSIS/PTA INC/SHUNT/IMG RIGHT Right 06/17/2018   IR US GUIDE VASC ACCESS RIGHT  06/17/2018   IR US GUIDE VASC ACCESS RIGHT   06/16/2018   IR US GUIDE VASC ACCESS RIGHT  02/06/2019   IR US GUIDE VASC ACCESS RIGHT  05/14/2019   IR US GUIDE VASC ACCESS RIGHT  09/17/2019   IR US GUIDE VASC ACCESS RIGHT  12/10/2019   IR US GUIDE VASC ACCESS RIGHT  08/18/2020   PARATHYROIDECTOMY N/A 04/24/2014   Procedure: TOTAL 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;  Surgeon: [name], MD;  Location: [facility];  Service: General;  Laterality: N/A;  NECK AND LEFT FOREARM   PERIPHERAL VASCULAR CATHETERIZATION N/A 05/05/2016   Procedure: A/V Shuntogram;  Surgeon: Conrad Harrison, MD;  Location: [place] CV LAB;  Service: Cardiovascular;  Laterality: N/A;   PERIPHERAL VASCULAR CATHETERIZATION Left 05/05/2016   Procedure: Peripheral Vascular Balloon Angioplasty;  Surgeon: Conrad [place], MD;  Location: [place] CV LAB;  Service: Cardiovascular;  Laterality: Left;   PSEUDOANEURYSM REPAIR Left 11/09/2015   TEE WITHOUT CARDIOVERSION N/A 02/19/2021   Procedure: TRANSESOPHAGEAL ECHOCARDIOGRAM (TEE);  Surgeon: [name], MD;  Location: [facility] ENDOSCOPY;  Service: Cardiovascular;  Laterality: N/A;   THROMBECTOMY / ARTERIOVENOUS GRAFT REVISION Left 08/18/2015   thigh   THROMBECTOMY AND REVISION OF ARTERIOVENTOUS (AV) GORETEX  GRAFT Left 08/23/2015   Procedure: THROMBECTOMY AND REVISION OF ARTERIOVENTOUS (AV) GORETEX  GRAFT LEFT THIGH ;  Surgeon: [name], MD;  Location: [facility];  Service: Vascular;  Laterality: Left;   THROMBECTOMY W/ EMBOLECTOMY Left 08/18/2015   Procedure: THROMBECTOMY  AND REVISION ARTERIOVENOUS GORE-TEX GRAFT/LEFT THIGH;  Surgeon: [name], MD;  Location: [facility];  Service: Vascular;  Laterality: Left;   THROMBECTOMY W/ EMBOLECTOMY Right 06/19/2018   Procedure: THROMBECTOMY AND REVISION OF RIGHT THIGH  ARTERIOVENOUS GORE-TEX GRAFT;  Surgeon: [name], MD;  Location: [facility] OR;  Service: Vascular;  Laterality: Right;   UPPER EXTREMITY VENOGRAPHY Bilateral 12/27/2016   Procedure: Upper Extremity  Venography;  Surgeon: [name], MD;  Location: [place] CV LAB;  Service: Cardiovascular;  Laterality: Bilateral;   VENOGRAM Left 05/05/2016   Procedure: Venogram;  Surgeon: Conrad Coloma, MD;  Location: [place] CV LAB;  Service: Cardiovascular;  Laterality: Left;  lower extremity       Family History  Problem Relation Age of Onset   Renal Disease Neg Hx     Social History   Tobacco Use   Smoking status: Former    Types: Cigarettes    Quit date: 03/21/1986    Years since quitting: 35.3   Smokeless tobacco: Never  Substance Use Topics   Alcohol use: No    Alcohol/week: 0.0 standard drinks   Drug use: No    Home Medications Prior to Admission medications   Medication Sig Start Date End Date Taking? Authorizing Provider  acetaminophen (TYLENOL) 500 MG tablet Take 1,000 mg by mouth daily as needed (for pain or headaches).     [provider]  albuterol (VENTOLIN HFA) 108 (  90 Base) MCG/ACT inhaler Inhale 1-2 puffs into the lungs every 6 (six) hours as needed for wheezing. 02/09/21   Larene Pickett, PA-C  amLODipine (NORVASC) 10 MG tablet Take 10 mg by mouth daily.    [provider]  B Complex-C-Folic Acid (DIALYVITE TABLET) TABS Take 1 tablet by mouth daily. 06/29/20   [provider]  benzonatate (TESSALON) 100 MG capsule Take 1 capsule (100 mg total) by mouth every 8 (eight) hours. 02/09/21   Larene Pickett, PA-C  diphenhydrAMINE (BENADRYL) 50 MG tablet Take 1 tablet (50 mg total) by mouth once for 1 dose. 50 mg po at 9am 06/17/2021 06/17/21 06/17/21  Tyson Alias, NP  hydrALAZINE (APRESOLINE) 50 MG tablet Take 50 mg by mouth 3 (three) times daily. 11/04/20   [provider]  HYDROcodone-acetaminophen (NORCO) 5-325 MG tablet Take 1 tablet by mouth every 4 (four) hours as needed (cough). Patient taking differently: Take 1 tablet by mouth every 4 (four) hours as needed for moderate pain (cough). [id number]   Delora Fuel, MD   lidocaine-prilocaine (EMLA) cream Apply 1 application topically daily as needed (port access).  02/03/20   [provider]  predniSONE (DELTASONE) 50 MG tablet Prednisone '50mg'$  po at 9pm 06/16/2021, Prednisone '50mg'$  po at 3am 06/17/2021, Prednisone '50mg'$  po at 9am 06/17/2021 06/15/21   Tyson Alias, NP  TUMS ULTRA 1000 1000 MG chewable tablet Chew 4,000 mg by mouth 3 (three) times daily with meals. 03/14/20   [provider]  fluticasone (FLONASE) 50 MCG/ACT nasal spray Place 2 sprays into both nostrils daily. Patient not taking: Reported on 06/15/2018 07/25/17 02/20/20  Larene Pickett, PA-C    Allergies    Ivp dye [iodinated diagnostic agents], Lisinopril, and Solu-medrol [methylprednisolone]  Review of Systems   Review of Systems  Constitutional:  Negative for activity change, appetite change and fever.  HENT:  Negative for congestion.   Respiratory:  Negative for cough, chest tightness and shortness of breath.   Gastrointestinal:  Negative for abdominal pain, nausea and vomiting.  Genitourinary:  Negative for dysuria.  Musculoskeletal:  Negative for arthralgias and myalgias.  Skin:  Negative for rash.  Neurological:  Negative for dizziness, weakness and headaches.   all other systems are negative except as noted in the HPI and PMH.   Physical Exam Updated Vital Signs BP (!) 144/87 (BP Location: Right Arm)   Pulse (!) 57   Temp 98.5 F (36.9 C) (Oral)   Resp 16   Ht 6' (1.829 m)   Wt 72 kg   SpO2 100%   BMI 21.53 kg/m   Physical Exam Vitals and nursing note reviewed.  Constitutional:      General: He is not in acute distress.    Appearance: He is well-developed.  HENT:     Head: Normocephalic and atraumatic.     Mouth/Throat:     Pharynx: No oropharyngeal exudate.  Eyes:     Conjunctiva/sclera: Conjunctivae normal.     Pupils: Pupils are equal, round, and reactive to light.  Neck:     Comments: No meningismus. Cardiovascular:     Rate and Rhythm: Normal  rate and regular rhythm.     Heart sounds: Normal heart sounds. No murmur heard. Pulmonary:     Effort: Pulmonary effort is normal. No respiratory distress.     Breath sounds: Normal breath sounds.  Abdominal:     Palpations: Abdomen is soft.     Tenderness: There is no abdominal tenderness. There is  no guarding or rebound.  Musculoskeletal:        General: No tenderness. Normal range of motion.     Cervical back: Normal range of motion and neck supple.     Comments: Right thigh dialysis graft has no palpable thrill or bruit.  No surrounding erythema or fluctuance  Skin:    General: Skin is warm.  Neurological:     Mental Status: He is alert and oriented to person, place, and time.     Cranial Nerves: No cranial nerve deficit.     Motor: No abnormal muscle tone.     Coordination: Coordination normal.     Comments:  5/5 strength throughout. CN 2-12 intact.Equal grip strength.   Psychiatric:        Behavior: Behavior normal.    ED Results / Procedures / Treatments   Labs (all labs ordered are listed, but only abnormal results are displayed) Labs Reviewed  BASIC METABOLIC PANEL - Abnormal; Notable for the following components:      Result Value   Sodium 132 (*)    Chloride 90 (*)    BUN 85 (*)    Creatinine, Ser 16.90 (*)    Calcium 8.8 (*)    GFR, Estimated 3 (*)    Anion gap 19 (*)    All other components within normal limits  CBC WITH DIFFERENTIAL/PLATELET - Abnormal; Notable for the following components:   RBC 4.20 (*)    RDW 17.0 (*)    All other components within normal limits  RESP PANEL BY RT-PCR (FLU A&B, COVID) [id number]    EKG EKG Interpretation  Date/Time:  Saturday July 10 2021 02:22:58 EDT Ventricular Rate:  55 PR Interval:  168 QRS Duration: 94 QT Interval:  460 QTC Calculation: 440 R Axis:   59 Text Interpretation: Sinus bradycardia Minimal voltage criteria for LVH, may be normal variant ( Sokolow-Lyon ) Borderline ECG No significant change was  found Confirmed by Ezequiel Essex [id number]) on 07/10/2021 2:31:38 AM  Radiology No results found.  Procedures Procedures   Medications Ordered in ED Medications - No data to display  ED Course  I have reviewed the triage vital signs and the nursing notes.  Pertinent labs & imaging results that were available during my care of the patient were reviewed by me and considered in my medical decision making (see chart for details).    MDM Rules/Calculators/A&P                          Nonfunctioning dialysis graft.  Vitals are stable, no distress.  EKG shows peaked T waves similar to previous.  Potassium however is normal  D/w Dr. Virl Cagey vascular surgery.  He states IR should be able to declot access.  Feels that he will not be able to address this until Monday at the earliest.  Potassium is normal.  No hypoxia or increased work of breathing.  Discussed with nephrology who will arrange for outpatient declotting on Monday.  Unable to reach overnight interventional radiologist.  Discussed with Dr. Earleen Newport at 7 AM.  He states will evaluate for possible declot versus alternative line placement today.  Patient with difficult vascular access in the past.  Does have peaked T waves but no indication for emergent dialysis his potassium is normal and T waves appear to be chronic.  Care to be transferred at shift change for IR evaluation. Final Clinical Impression(s) / ED Diagnoses Final diagnoses:  None    Rx /  DC Orders ED Discharge Orders     None        Clerance Umland, Annie Main, MD 07/10/21 [phone]

## 2021-07-10 NOTE — ED Notes (Signed)
The pt has been asleep since he was brought back from triage

## 2021-07-10 NOTE — Procedures (Signed)
Interventional Radiology Procedure Note  Procedure: Placement of a right IJ approach tunneled HD catheter. .  Tip is positioned at the superior cavoatrial junction and catheter is ready for immediate use.   We did not attempt thrombectomy/declot of the loop graft at this time, as he has contrast allergy, and was not properly medicated.  We will set up for follow up next week.   Complications: None  Recommendations:  - Ok to use HD cath now - DC ok after 1 hr of recovery from sedation standpoint when goals met - We will set up for appt next week with premed and declot - Do not submerge - Routine line care   Signed,  Jaime S. Wagner, DO   

## 2021-07-10 NOTE — Progress Notes (Signed)
[name] is a 50 Y/O male with ESRD on hemodialysis MWF at [facility]. Last HD 07/07/2021. He presented to ED 07/09/2021 with clotted thigh AVG. Was sent to IR this AM for placement of TDC. Unable to do declot today D/T contrast dye allergy, patient was not properly pre-medicated. Will need to return to IR next week for declot. Consequently found to be COVID +. Recent COVID infection in 05/2021. Does not need to be isolated for HD. Will have HD here then return to ED if stable for discharge.   HD orders: [facility] MWF 4 hrs 180NRe 450/800 72.5 kg 2.0 K/ 2.5 Ca Linear Na UFP 4 Thigh AVG -Heparin 1500 units IV TIW -Calcitriol 0.25 mcg PO TIW  Juanell Fairly [facility] [phone]

## 2021-07-10 NOTE — H&P (Addendum)
Chief Complaint: Patient was seen in consultation today for shuntogram of right thigh AVG with intervention  Chief Complaint  Patient presents with   Vascular Access Problem   at the request of Karmen Bongo   Referring Physician(s): Karmen Bongo   Supervising Physician: [name]  Patient Status: [facility] - ED  History of Present Illness: [name] is a 50 y.o. 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 patient is known to our service for previous shuntogram with interventions. BMP stable and renal function at patient's baseline; however, patient currently does not have any access for dialysis, and he missed dialysis on Friday due to nonfunctioning AVG.    IR was requested for shuntogram of the right thigh AVG with possible intervention including angioplasty, declot, and possible tunneled hemodialysis catheter placement.  Patient was evaluated in [facility] ED. patient laying in bed, not in acute distress.  Reports mild headache. Denise fever, chills, shortness of breath, cough, chest pain, abdominal pain, nausea ,vomiting, and bleeding.   Past Medical History:  Diagnosis Date   Anemia    Anxiety    Cough    DRY     Depression    ESRD on hemodialysis ([place])    HD [place] MWF   Headache(784.0)    Hypertension    Muscle spasms of neck    BACK, NECK   Pneumonia 2015ish   Thyroid disease     Past Surgical History:  Procedure Laterality Date   ARTERIOVENOUS GRAFT PLACEMENT Left    "forearm, it's not working; thigh"    ARTERIOVENOUS GRAFT PLACEMENT Left 11/09/2015   AV FISTULA PLACEMENT Right 01/10/2017   Procedure: INSERTION OF ARTERIOVENOUS Right thigh GORE-TEX GRAFT;  Surgeon: [name], MD;  Location: [place];  Service: Vascular;  Laterality: Right;   FALSE ANEURYSM REPAIR Left 11/09/2015   Procedure: REPAIR OF LEFT FEMORAL  PSEUDOANEURYSM; REVISION  OF LEFT THIGH ARTERIOVENOUS GRAFT USING 6MM X 10 CM GORETEX GRAFT ;  Surgeon: [name], MD;  Location: [place];  Service: Vascular;  Laterality: Left;   IR AV DIALY SHUNT INTRO NEEDLE/INTRACATH INITIAL W/PTA/IMG RIGHT Right 02/06/2019   IR AV DIALY SHUNT INTRO NEEDLE/INTRACATH INITIAL W/PTA/IMG RIGHT Right 05/14/2019   IR AV DIALY SHUNT INTRO NEEDLE/INTRACATH INITIAL W/PTA/IMG RIGHT Right 08/18/2020   IR DIALY SHUNT INTRO NEEDLE/INTRACATH INITIAL W/IMG RIGHT Right 09/17/2019   IR DIALY SHUNT INTRO NEEDLE/INTRACATH INITIAL W/IMG RIGHT Right 12/10/2019   IR GENERIC HISTORICAL  07/15/2016   IR US GUIDE VASC ACCESS LEFT 07/15/2016 [name], MD [facility]   IR GENERIC HISTORICAL Left 07/15/2016   IR THROMBECTOMY AV FISTULA W/THROMBOLYSIS/PTA INC/SHUNT/IMG LEFT 07/15/2016 [name], MD [facility]   IR GENERIC HISTORICAL  10/05/2016   IR US GUIDE VASC ACCESS LEFT 10/05/2016 [name], MD [facility]   IR GENERIC HISTORICAL Left 10/05/2016   IR THROMBECTOMY AV FISTULA W/THROMBOLYSIS/PTA INC/SHUNT/IMG LEFT 10/05/2016 [name], MD [facility]   IR GENERIC HISTORICAL  10/08/2016   IR FLUORO GUIDE CV LINE RIGHT 10/08/2016 [name], MD [facility]   IR GENERIC HISTORICAL  10/08/2016   IR US GUIDE VASC ACCESS RIGHT 10/08/2016 [name], MD [facility]   IR PTA AND STENT ADDL CENTRAL DIALY SEG THRU DIALY CIRCUIT RIGHT Right 12/10/2019   IR RADIOLOGIST EVAL & MGMT  11/26/2019   IR REMOVAL TUN CV CATH W/O FL  03/16/2017   IR THROMBECTOMY AV FISTULA W/THROMBOLYSIS/PTA INC/SHUNT/IMG RIGHT Right 06/16/2018   IR THROMBECTOMY AV FISTULA W/THROMBOLYSIS/PTA INC/SHUNT/IMG RIGHT Right 06/17/2018   IR US GUIDE VASC ACCESS RIGHT  06/17/2018   IR US GUIDE VASC ACCESS RIGHT  06/16/2018   IR US GUIDE VASC ACCESS RIGHT  02/06/2019   IR US GUIDE VASC ACCESS RIGHT  05/14/2019   IR US GUIDE VASC ACCESS RIGHT  09/17/2019   IR US GUIDE VASC ACCESS RIGHT  12/10/2019   IR US GUIDE VASC ACCESS  RIGHT  08/18/2020   PARATHYROIDECTOMY N/A 04/24/2014   Procedure: TOTAL 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;  Surgeon: [name], MD;  Location: [place];  Service: General;  Laterality: N/A;  NECK AND LEFT FOREARM   PERIPHERAL VASCULAR CATHETERIZATION N/A 05/05/2016   Procedure: A/V Shuntogram;  Surgeon: Conrad Deer [place], MD;  Location: [place] [facility];  Service: Cardiovascular;  Laterality: N/A;   PERIPHERAL VASCULAR CATHETERIZATION Left 05/05/2016   Procedure: Peripheral Vascular Balloon Angioplasty;  Surgeon: Conrad Ribera, MD;  Location: [place] [facility];  Service: Cardiovascular;  Laterality: Left;   PSEUDOANEURYSM REPAIR Left 11/09/2015   TEE WITHOUT CARDIOVERSION N/A 02/19/2021   Procedure: TRANSESOPHAGEAL ECHOCARDIOGRAM (TEE);  Surgeon: [name], MD;  Location: [facility] ENDOSCOPY;  Service: Cardiovascular;  Laterality: N/A;   THROMBECTOMY / ARTERIOVENOUS GRAFT REVISION Left 08/18/2015   thigh   THROMBECTOMY AND REVISION OF ARTERIOVENTOUS (AV) GORETEX  GRAFT Left 08/23/2015   Procedure: THROMBECTOMY AND REVISION OF ARTERIOVENTOUS (AV) GORETEX  GRAFT LEFT THIGH ;  Surgeon: [name], MD;  Location: [place];  Service: Vascular;  Laterality: Left;   THROMBECTOMY W/ EMBOLECTOMY Left 08/18/2015   Procedure: THROMBECTOMY  AND REVISION ARTERIOVENOUS GORE-TEX GRAFT/LEFT THIGH;  Surgeon: [name], MD;  Location: [place];  Service: Vascular;  Laterality: Left;   THROMBECTOMY W/ EMBOLECTOMY Right 06/19/2018   Procedure: THROMBECTOMY AND REVISION OF RIGHT THIGH  ARTERIOVENOUS GORE-TEX GRAFT;  Surgeon: [name], MD;  Location: [facility] OR;  Service: Vascular;  Laterality: Right;   UPPER EXTREMITY VENOGRAPHY Bilateral 12/27/2016   Procedure: Upper Extremity Venography;  Surgeon: [name], MD;  Location: [place] [facility];  Service: Cardiovascular;  Laterality: Bilateral;   VENOGRAM Left 05/05/2016   Procedure: Venogram;  Surgeon: Conrad Oberon, MD;  Location: [place] [facility];  Service: Cardiovascular;  Laterality: Left;  lower extremity    Allergies: Ivp dye [iodinated diagnostic agents], Lisinopril, and Solu-medrol [methylprednisolone]  Medications: Prior to Admission medications   Medication Sig Start Date End Date Taking? Authorizing Provider  acetaminophen (TYLENOL) 500 MG tablet Take 1,000 mg by mouth daily as needed (for pain or headaches).    Yes [provider]  albuterol (VENTOLIN HFA) 108 (90 Base) MCG/ACT inhaler Inhale 1-2 puffs into the lungs every 6 (six) hours as needed for wheezing. 02/09/21  Yes Larene Pickett, PA-C  amLODipine (NORVASC) 10 MG tablet Take 10 mg by mouth at bedtime.   Yes [provider]  B Complex-C-Folic Acid (DIALYVITE TABLET) TABS Take 1 tablet by mouth daily. 06/29/20  Yes [provider]  cloNIDine (CATAPRES) 0.2 MG tablet Take 0.1 mg by mouth 3 (three) times daily.   Yes [provider]  lidocaine-prilocaine (EMLA) cream Apply 1 application topically daily as needed (port access).  02/03/20  Yes [provider]  TUMS ULTRA 1000 1000 MG chewable tablet Chew 4,000 mg by mouth 3 (three) times daily with meals. 03/14/20  Yes  [provider]  hydrALAZINE (APRESOLINE) 50 MG tablet Take 50 mg by mouth 3 (three) times daily. Patient not taking: Reported on 07/10/2021 11/04/20   [provider]  fluticasone (FLONASE) 50 MCG/ACT nasal spray Place 2 sprays into both nostrils daily. Patient not taking: Reported on 06/15/2018 07/25/17 02/20/20  Larene Pickett, PA-C     Family History  Problem Relation Age of Onset   Renal Disease Neg Hx     Social History   Socioeconomic History   Marital status: Married    Spouse name: Not on file   Number of children: Not on file   Years of education: Not on file   Highest education level: Not on file  Occupational History   Not on file  Tobacco Use   Smoking status: Former    Types: Cigarettes    Quit date:  03/21/1986    Years since quitting: 35.3   Smokeless tobacco: Never  Substance and Sexual Activity   Alcohol use: No    Alcohol/week: 0.0 standard drinks   Drug use: No   Sexual activity: Not on file  Other Topics Concern   Not on file  Social History Narrative   Not on file   Social Determinants of Health   Financial Resource Strain: Not on file  Food Insecurity: Not on file  Transportation Needs: Not on file  Physical Activity: Not on file  Stress: Not on file  Social Connections: Not on file     Review of Systems: A 12 point ROS discussed and pertinent positives are indicated in the HPI above.  All other systems are negative.  Vital Signs: BP (!) 142/93 (BP Location: Right Arm)   Pulse 64   Temp 97.8 F (36.6 C) (Oral)   Resp 20   Ht 6' (1.829 m)   Wt 158 lb 11.7 oz (72 kg)   SpO2 100%   BMI 21.53 kg/m    Physical Exam Vitals reviewed.  Constitutional:      General: He is not in acute distress. HENT:     Head: Normocephalic and atraumatic.     Mouth/Throat:     Mouth: Mucous membranes are moist.  Eyes:     General: Scleral icterus present.  Cardiovascular:     Rate and Rhythm: Normal rate and regular rhythm.     Heart sounds: Normal heart sounds.  Pulmonary:     Effort: Pulmonary effort is normal.     Breath sounds: Normal breath sounds.  Abdominal:     General: Abdomen is flat. Bowel sounds are normal.     Palpations: Abdomen is soft.  Musculoskeletal:     Cervical back: Normal range of motion and neck supple.  Skin:    General: Skin is warm and dry.  Neurological:     Mental Status: He is alert and oriented to person, place, and time.  Psychiatric:        Mood and Affect: Mood normal.        Behavior: Behavior normal.        Judgment: Judgment normal.    MD Evaluation Airway: WNL Heart: WNL Abdomen: WNL Chest/ Lungs: WNL ASA  Classification: 3 Mallampati/Airway Score: Two  Imaging: No results found.  Labs:  CBC: Recent Labs     02/17/21 0830 02/19/21 0840 05/27/21 0347 05/27/21 0406 07/09/21 1210  WBC 4.7 7.6 6.7  --  8.8  HGB 10.2* 10.2* 13.4 11.9* 13.0  HCT 31.3* 31.7* 39.7 35.0* 39.9  PLT 303 381 156  --  170    COAGS: Recent Labs    05/27/21 0347  INR 1.0    BMP: Recent Labs    05/27/21 0347 05/27/21 0406 05/27/21 0455 05/27/21 0720 07/09/21 1210  NA 138 134* 135 135 132*  K 7.5* 6.9* 6.8* >7.5* 4.3  CL 96* 101 93* 94* 90*  CO2 22  --  22 21* 23  GLUCOSE 96 92 151* 71 85  BUN 85* 78* 84* 82* 85*  CALCIUM 8.5*  --  8.1* 8.2* 8.8*  CREATININE 17.96* >18.00* 17.64* 17.84* 16.90*  GFRNONAA 3*  --  3* 3* 3*    LIVER FUNCTION TESTS: Recent Labs    02/14/21 2022 02/17/21 0512 02/19/21 0841 05/27/21 0347 05/27/21 0720  BILITOT 1.4*  --   --  0.8  --   AST 15  --   --  25  --   ALT 16  --   --  14  --   ALKPHOS 94  --   --  65  --   PROT 6.6  --   --  8.6*  --   ALBUMIN 3.0* 2.4* 2.6* 4.1 3.9    TUMOR MARKERS: No results for input(s): AFPTM, CEA, CA199, CHROMGRNA in the last 8760 hours.  Assessment and Plan: 50 y.o. 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  Patient missed dialysis on Friday, 07/09/2021.  Patient has extensive vascular surgery history s/p multiple dialysis circuit creations.  Patient is well-known to IR service for previous shuntogram with intervention, patient currently has no access for dialysis.  IR was requested for shuntogram of right thigh AVG with possible intervention including angioplasty, declot, and possible TDC placement. Case was reviewed and approved by Dr. Earleen Newport.   The procedure is tentatively scheduled for today pending IR schedule. N.p.o. since yesterday per patient. VS hypertensive 142/93 CBC with no leukocytosis BMP stable, RF at baseline Not on AC/AP treatment. Ancef 2 g ordered   Patient tested positive for COVID today Pt appears to have no IV assess per chart, asked ED  RN to place an IV   Risks and benefits discussed with the patient including, but not limited to bleeding, infection, vascular injury, pulmonary embolism, need for tunneled HD catheter placement.   All of the patient's questions were answered, patient is agreeable to proceed. Consent signed and in IR binder.    Thank you for this interesting consult.  I greatly enjoyed meeting Rayford Abdou Lanphear and look forward to participating in their care.  A copy of this report was sent to the requesting provider on this date.  Electronically Signed: Tera Mater, PA-C 07/10/2021, 9:40 AM   I spent a total of    25 Minutes in face to face in clinical consultation, greater than 50% of which was counseling/coordinating care for shuntogram with possible interventions.  This chart was dictated using voice recognition software.  Despite best efforts to proofread,  errors can occur which can change the documentation meaning.

## 2021-07-10 NOTE — ED Notes (Signed)
Pt returned from IR.

## 2021-07-11 DIAGNOSIS — T82868A Thrombosis of vascular prosthetic devices, implants and grafts, initial encounter: Secondary | ICD-10-CM | POA: Diagnosis present

## 2021-07-11 LAB — CBC
HCT: 40 % (ref 39.0–52.0)
Hemoglobin: 13.5 g/dL (ref 13.0–17.0)
MCH: 31.2 pg (ref 26.0–34.0)
MCHC: 33.8 g/dL (ref 30.0–36.0)
MCV: 92.4 fL (ref 80.0–100.0)
Platelets: 171 10*3/uL (ref 150–400)
RBC: 4.33 MIL/uL (ref 4.22–5.81)
RDW: 16.2 % — ABNORMAL HIGH (ref 11.5–15.5)
WBC: 4.4 10*3/uL (ref 4.0–10.5)
nRBC: 0 % (ref 0.0–0.2)

## 2021-07-11 LAB — COMPREHENSIVE METABOLIC PANEL
ALT: 10 U/L (ref 0–44)
AST: 18 U/L (ref 15–41)
Albumin: 3.3 g/dL — ABNORMAL LOW (ref 3.5–5.0)
Alkaline Phosphatase: 71 U/L (ref 38–126)
Anion gap: 15 (ref 5–15)
BUN: 59 mg/dL — ABNORMAL HIGH (ref 6–20)
CO2: 25 mmol/L (ref 22–32)
Calcium: 8.2 mg/dL — ABNORMAL LOW (ref 8.9–10.3)
Chloride: 93 mmol/L — ABNORMAL LOW (ref 98–111)
Creatinine, Ser: 13.97 mg/dL — ABNORMAL HIGH (ref 0.61–1.24)
GFR, Estimated: 4 mL/min — ABNORMAL LOW (ref >60)
Glucose, Bld: 88 mg/dL (ref 70–99)
Potassium: 4.2 mmol/L (ref 3.5–5.1)
Sodium: 133 mmol/L — ABNORMAL LOW (ref 135–145)
Total Bilirubin: 0.5 mg/dL (ref 0.3–1.2)
Total Protein: 8.5 g/dL — ABNORMAL HIGH (ref 6.5–8.1)

## 2021-07-11 LAB — FERRITIN: Ferritin: 1334 ng/mL — ABNORMAL HIGH (ref 24–336)

## 2021-07-11 LAB — D-DIMER, QUANTITATIVE: D-Dimer, Quant: 2.06 ug/mL-FEU — ABNORMAL HIGH (ref 0.00–0.50)

## 2021-07-11 LAB — C-REACTIVE PROTEIN: CRP: 9 mg/dL — ABNORMAL HIGH (ref <1.0)

## 2021-07-11 MED ORDER — CLONIDINE HCL 0.2 MG PO TABS: 0.1000 mg | ORAL_TABLET | Freq: Every day | ORAL | Status: DC

## 2021-07-11 MED ORDER — HYDROCODONE-ACETAMINOPHEN 5-325 MG PO TABS: 1.0000 | ORAL_TABLET | Freq: Three times a day (TID) | ORAL | 0 refills | Status: AC | PRN

## 2021-07-11 NOTE — Plan of Care (Signed)
  Problem: Health Behavior/Discharge Planning: Goal: Ability to manage health-related needs will improve Outcome: Adequate for Discharge   

## 2021-07-11 NOTE — Progress Notes (Signed)
DISCHARGE NOTE HOME Madison Abdou Ramberg to be discharge to home per MD order. Discussed prescriptions and follow up appointments with the patient. Prescriptions given to patient; medication list explained in detail. Patient verbalized understanding.  Skin clean, dry and intact without evidence of skin break down, no evidence of skin tears noted. IV catheter discontinued intact. Site without signs and symptoms of complications. Dressing and pressure applied. Pt denies pain at the site currently. No complaints noted.  Patient free of lines, drains, and wounds.   An After Visit Summary (AVS) was printed and given to the patient. Patient escorted via wheelchair, and discharged home via private auto.  [place], Zenon Mayo, RN

## 2021-07-11 NOTE — TOC Transition Note (Signed)
Transition of Care [facility]) - CM/SW Discharge Note   Patient Details  Name: [name] MRN: [id number] Date of Birth: 1970-10-05  [facility] CM/SW Contact:  Carles Collet, RN Phone Number: 07/11/2021, 12:06 PM   Clinical Narrative:   Spoke to patient at bedside. Provided with Good Rx coupon for clonidine and list of [facility] to pick from to call Monday to establish with a PCP. He states that he has transportation home    Final next level of care: Home/Self Care Barriers to Discharge: No Barriers Identified   Patient Goals and CMS Choice        Discharge Placement                       Discharge Plan and Services                                     Social Determinants of Health (SDOH) Interventions     Readmission Risk Interventions No flowsheet data found.

## 2021-07-11 NOTE — Discharge Summary (Signed)
Physician Discharge Summary  Remington [facility] DCV:[id number] DOB: 03-14-1971 DOA: 07/09/2021  PCP: Patient, No Pcp Per (Inactive)  Admit date: 07/09/2021 Discharge date: 07/11/2021  Time spent: 40 minutes  Recommendations for Outpatient Follow-up:  Follow outpatient CBC/CMP Follow inflammatory markers outpatient - elevated here, unclear cause Follow with IR next week for  thrombectomy of right femoral loop graft  Needs IVUS evaluation and/or empiric balloon angioplasty due to stenotic SVC - consider at time of future catheter removal  Discharge Diagnoses:  Principal Problem:   Dialysis AV fistula malfunction, initial encounter ([place]) Active Problems:   ESRD (end stage renal disease) on dialysis [facility])   Essential hypertension   COVID-19 virus infection   Thrombosis of dialysis vascular access [facility])   Discharge Condition: stable  Diet recommendation: renal   Filed Weights   07/10/21 1319 07/10/21 1928 07/10/21 2300  Weight: 72.9 kg 75.5 kg 73.5 kg    History of present illness:  [name] is [name] 50 y.o. male with medical history significant of HTN and ESRD on MWF HD presenting with vascular access problem.  He reports having Yousra Ivens regular and full HD session on Wednesday.  Thursday, he noted R thigh fistula malfunction as well as fever and malaise.  He feels better today but continues to feel nauseated and mild malaise.  He has no other HD access and so called nephrology and was told to come in to the ER for fistula repair.  He was admitted for declotting, but has contrast allergy, so plan for next week.  Tunneled dialysis cathter placed and is ok for discharge after HD.  Afebrile with normal white count during hospital stay.   See below for additional details  Hospital Course:  AV access malfunction -Patient with ESRD, has clotted thigh AVG which has been Oddie Bottger recurrent issue -Needs declotting so that he can have HD -He was scheduled for declotting but has contrast  allergy and so this was decided to happen next week as an outpatient -In the meantime, he had TDC placed ok for d/c today   ESRD on HD -Patient on chronic MWF HD -clotted thigh AVG -> needs to return to IR next week for declot -tunneled line placed by IR   COVID + 8/25 -positive again 10/8 -no need for isolation given recent positive test -can follow up any recurrent infectious symptoms outpatient - afebrile here, normal WBC   Elevated D dimer - clotted thigh AVG probably explains this - no hypoxia, tachycardia, or LE edema  Elevated CRP  Procal  Ferritin Unclear cause at this point, follow outpatient - no clear indication for inpatient management   HTN  -Continue Norvasc -he's not taking hydralazine.  Only clonidine once daily - follow outpatient with PCP/nephrology on this regimen   Procedures: IMPRESSION: Status post right IJ tunneled hemodialysis catheter.   Significant stenosis of the superior vena cava, secondary to partially calcified fibrin sheath/chronic changes identified on prior CT.   PLAN: The patient will return for attempt at thrombectomy of the right femoral loop graft, with premedication for his documented contrast reaction history.   At the time of Denis Carreon future catheter removal, would advise consideration of IVUS evaluation and/or empiric balloon angioplasty of what will be Kierrah Kilbride significantly stenotic SVC, given the presence of the partially calcified fibrin sheath on prior CT.  Consultations: Nephrology IR  Discharge Exam: Vitals:   07/11/21 0606 07/11/21 0928  BP: (!) 145/93 (!) 140/97  Pulse: 63 72  Resp: 18 18  Temp: 98.4  F (36.9 C) 98 F (36.7 C)  SpO2: 100% 100%   C/o some pain with tunneled IJ placement Discussed d/c plan  General: No acute distress. Cardiovascular: RRR, tunneled R IJ Lungs: unlabored Abdomen: Soft, nontender, nondistended  Neurological: Alert and oriented 3. Moves all extremities 4 . Cranial nerves II through XII  grossly intact. Skin: Warm and dry. No rashes or lesions. Extremities: No clubbing or cyanosis. No edema.  Discharge Instructions   Discharge Instructions     Call MD for:  difficulty breathing, headache or visual disturbances   Complete by: As directed    Call MD for:  extreme fatigue   Complete by: As directed    Call MD for:  hives   Complete by: As directed    Call MD for:  persistant dizziness or light-headedness   Complete by: As directed    Call MD for:  persistant nausea and vomiting   Complete by: As directed    Call MD for:  redness, tenderness, or signs of infection (pain, swelling, redness, odor or green/yellow discharge around incision site)   Complete by: As directed    Call MD for:  severe uncontrolled pain   Complete by: As directed    Call MD for:  temperature >100.4   Complete by: As directed    Diet - low sodium heart healthy   Complete by: As directed    Discharge instructions   Complete by: As directed    You were seen for Verlinda Slotnick clotted dialysis access.    You had Kaelea Gathright tunneled dialysis catheter placed and interventional radiology will touch base with you about getting scheduled for management of the clotted access.  They'll also follow up with you about stenosis (narrowing) of your superior vena cava, you may need Kyon Bentler procedure in the future for this.   Resume dialysis as scheduled.  Follow up with your PCP and kidney doctor for management of your blood pressure.  You're taking clonidine once daily and not taking hydralazine, I've made adjustments to your medications to reflect how you're taking this at home.  Your covid test was positive, but this is within 3 months of your positive test in August.  You do not need to isolate, but if you have new or concerning infectious symptoms you should have these evaluated.  Return for new, recurrent, or worsening symptoms.  Please ask your PCP to request records from this hospitalization so they know what was done and what  the next steps will be.   Discharge wound care:   Complete by: As directed    Per interventional radiology Do not submerge your line   Increase activity slowly   Complete by: As directed       Allergies as of 07/11/2021       Reactions   Ivp Dye [iodinated Diagnostic Agents] Swelling   SWELLING REACTION UNSPECIFIED    Lisinopril Cough   Solu-medrol [methylprednisolone] Nausea And Vomiting        Medication List     STOP taking these medications    hydrALAZINE 50 MG tablet Commonly known as: APRESOLINE       TAKE these medications    acetaminophen 500 MG tablet Commonly known as: TYLENOL Take 1,000 mg by mouth daily as needed (for pain or headaches).   albuterol 108 (90 Base) MCG/ACT inhaler Commonly known as: VENTOLIN HFA Inhale 1-2 puffs into the lungs every 6 (six) hours as needed for wheezing.   amLODipine 10 MG tablet Commonly known as:  NORVASC Take 10 mg by mouth at bedtime.   cloNIDine 0.2 MG tablet Commonly known as: CATAPRES Take 0.5 tablets (0.1 mg total) by mouth daily. What changed: when to take this   DIALYVITE TABLET Tabs Take 1 tablet by mouth daily.   HYDROcodone-acetaminophen 5-325 MG tablet Commonly known as: NORCO/VICODIN Take 1 tablet by mouth every 8 (eight) hours as needed for up to 3 days for moderate pain.   lidocaine-prilocaine cream Commonly known as: EMLA Apply 1 application topically daily as needed (port access).   Tums Ultra 1000 400 MG chewable tablet Generic drug: calcium elemental as carbonate Chew 4,000 mg by mouth 3 (three) times daily with meals.               Discharge Care Instructions  (From admission, onward)           Start     Ordered   07/11/21 0000  Discharge wound care:       Comments: Per interventional radiology Do not submerge your line   07/11/21 1029           Allergies  Allergen Reactions   Ivp Dye [Iodinated Diagnostic Agents] Swelling    SWELLING REACTION UNSPECIFIED     Lisinopril Cough   Solu-Medrol [Methylprednisolone] Nausea And Vomiting      The results of significant diagnostics from this hospitalization (including imaging, microbiology, ancillary and laboratory) are listed below for reference.    Significant Diagnostic Studies: IR Fluoro Guide CV Line Right  Result Date: 07/10/2021 INDICATION: 50 year old male with Deion Swift history of renal failure, thrombosed right femoral loop graft, contrast reaction history with need for premedication, presents for tunneled hemodialysis catheter EXAM: IMAGE GUIDED PLACEMENT TUNNELED HEMODIALYSIS CATHETER MEDICATIONS: 2 g Ancef; The antibiotic was administered within an appropriate time interval prior to skin puncture. ANESTHESIA/SEDATION: Moderate (conscious) sedation was employed during this procedure. Annistyn Depass total of Versed 1.0 mg and Fentanyl 50 mcg was administered intravenously. Moderate Sedation Time: 25 minutes. The patient's level of consciousness and vital signs were monitored continuously by radiology nursing throughout the procedure under my direct supervision. FLUOROSCOPY TIME:  Fluoroscopy Time: 0 minutes 36 seconds (1 mGy). COMPLICATIONS: None immediate. PROCEDURE: Informed written consent was obtained from the patient after Rubens Cranston discussion of the risks, benefits, and alternatives to treatment. Questions regarding the procedure were encouraged and answered. The right neck and chest were prepped with chlorhexidine in Hayla Hinger sterile fashion, and Deangleo Passage sterile drape was applied covering the operative field. Maximum barrier sterile technique with sterile gowns and gloves were used for the procedure. Annahi Short timeout was performed prior to the initiation of the procedure. Ultrasound survey was performed. Micropuncture kit was utilized to access the right internal jugular vein under direct, real-time ultrasound guidance after the overlying soft tissues were anesthetized with 1% lidocaine with epinephrine. Stab incision was made with 11 blade scalpel.  Microwire was passed centrally. Note that during manipulation of the wire, Deyton Ellenbecker significant stenosis of the superior vena cava was encountered. Prior CT 02/15/2021 demonstrates Teriann Livingood partially calcified fibrin sheath remnant/chronic calcified venous thrombus at this site. The microwire was then marked to measure appropriate internal catheter length. External tunneled length was estimated. Sidnee Gambrill total tip to cuff length of 23 cm was selected. 035 guidewire was advanced to the level of the IVC. Skin and subcutaneous tissues of chest wall below the clavicle were generously infiltrated with 1% lidocaine for local anesthesia. Kanitra Purifoy small stab incision was made with 11 blade scalpel. The selected hemodialysis catheter was tunneled in  Jennie Bolar retrograde fashion from the anterior chest wall to the venotomy incision. Serial dilation was performed and then Jenese Mischke peel-away sheath was placed. The catheter was then placed through the peel-away sheath with tips ultimately positioned within the superior aspect of the right atrium. Final catheter positioning was confirmed and documented with Brielynn Sekula spot radiographic image. The catheter aspirates and flushes normally. The catheter was flushed with appropriate volume heparin dwells. The catheter exit site was secured with Charita Lindenberger 0-Prolene retention suture. Gel-Foam slurry was infused into the soft tissue tract. The venotomy incision was closed Derma bond and sterile dressing. Dressings were applied at the chest wall. Patient tolerated the procedure well and remained hemodynamically stable throughout. No complications were encountered and no significant blood loss encountered. IMPRESSION: Status post right IJ tunneled hemodialysis catheter. Significant stenosis of the superior vena cava, secondary to partially calcified fibrin sheath/chronic changes identified on prior CT. Signed, Dulcy Fanny. Dellia Nims, RPVI [facility] Radiology PLAN: The patient will return for attempt  at thrombectomy of the right femoral loop graft, with premedication for his documented contrast reaction history. At the time of Ysabel Cowgill future catheter removal, would advise consideration of IVUS evaluation and/or empiric balloon angioplasty of what will be Shontel Santee significantly stenotic SVC, given the presence of the partially calcified fibrin sheath on prior CT. Electronically Signed   By: [name] D.O.   On: 07/10/2021 12:39   IR US Guide Vasc Access Right  Result Date: 07/10/2021 INDICATION: 50 year old male with Meekah Math history of renal failure, thrombosed right femoral loop graft, contrast reaction history with need for premedication, presents for tunneled hemodialysis catheter EXAM: IMAGE GUIDED PLACEMENT TUNNELED HEMODIALYSIS CATHETER MEDICATIONS: 2 g Ancef; The antibiotic was administered within an appropriate time interval prior to skin puncture. ANESTHESIA/SEDATION: Moderate (conscious) sedation was employed during this procedure. Kameryn Davern total of Versed 1.0 mg and Fentanyl 50 mcg was administered intravenously. Moderate Sedation Time: 25 minutes. The patient's level of consciousness and vital signs were monitored continuously by radiology nursing throughout the procedure under my direct supervision. FLUOROSCOPY TIME:  Fluoroscopy Time: 0 minutes 36 seconds (1 mGy). COMPLICATIONS: None immediate. PROCEDURE: Informed written consent was obtained from the patient after Everli Rother discussion of the risks, benefits, and alternatives to treatment. Questions regarding the procedure were encouraged and answered. The right neck and chest were prepped with chlorhexidine in Adilyn Humes sterile fashion, and Suvan Stcyr sterile drape was applied covering the operative field. Maximum barrier sterile technique with sterile gowns and gloves were used for the procedure. Charidy Cappelletti timeout was performed prior to the initiation of the procedure. Ultrasound survey was performed. Micropuncture kit was utilized to access the right internal jugular vein under direct, real-time  ultrasound guidance after the overlying soft tissues were anesthetized with 1% lidocaine with epinephrine. Stab incision was made with 11 blade scalpel. Microwire was passed centrally. Note that during manipulation of the wire, Leighann Amadon significant stenosis of the superior vena cava was encountered. Prior CT 02/15/2021 demonstrates Pecola Haxton partially calcified fibrin sheath remnant/chronic calcified venous thrombus at this site. The microwire was then marked to measure appropriate internal catheter length. External tunneled length was estimated. Demri Poulton total tip to cuff length of 23 cm was selected. 035 guidewire was advanced to the level of the IVC. Skin and subcutaneous tissues of chest wall below the clavicle were generously infiltrated with 1% lidocaine for local anesthesia. Banessa Mao small stab incision was made with 11 blade scalpel. The selected hemodialysis catheter was tunneled in Shannen Vernon retrograde fashion from the anterior  chest wall to the venotomy incision. Serial dilation was performed and then Jeanpaul Biehl peel-away sheath was placed. The catheter was then placed through the peel-away sheath with tips ultimately positioned within the superior aspect of the right atrium. Final catheter positioning was confirmed and documented with Madilyne Tadlock spot radiographic image. The catheter aspirates and flushes normally. The catheter was flushed with appropriate volume heparin dwells. The catheter exit site was secured with Eldor Conaway 0-Prolene retention suture. Gel-Foam slurry was infused into the soft tissue tract. The venotomy incision was closed Derma bond and sterile dressing. Dressings were applied at the chest wall. Patient tolerated the procedure well and remained hemodynamically stable throughout. No complications were encountered and no significant blood loss encountered. IMPRESSION: Status post right IJ tunneled hemodialysis catheter. Significant stenosis of the superior vena cava, secondary to partially calcified fibrin sheath/chronic changes identified on prior  CT. Signed, Dulcy Fanny. Dellia Nims, RPVI [facility] Radiology PLAN: The patient will return for attempt at thrombectomy of the right femoral loop graft, with premedication for his documented contrast reaction history. At the time of Dorri Ozturk future catheter removal, would advise consideration of IVUS evaluation and/or empiric balloon angioplasty of what will be Lore Polka significantly stenotic SVC, given the presence of the partially calcified fibrin sheath on prior CT. Electronically Signed   By: [name] D.O.   On: 07/10/2021 12:39   DG CHEST PORT 1 VIEW  Result Date: 07/10/2021 CLINICAL DATA:  COVID positive today. EXAM: PORTABLE CHEST 1 VIEW COMPARISON:  May 27, 2021 FINDINGS: The heart size and mediastinal contours are stable. The aorta is tortuous. Both lungs are clear. The visualized skeletal structures are unremarkable. Degenerative joint changes of bilateral acromioclavicular joints are noted. IMPRESSION: No active disease. Electronically Signed   By: Abelardo Diesel M.D.   On: 07/10/2021 10:39    Microbiology: Recent Results (from the past 240 hour(s))  Resp Panel by RT-PCR (Flu Haislee Corso&B, Covid) Nasopharyngeal Swab     Status: Abnormal   Collection Time: 07/10/21  8:00 AM   Specimen: Nasopharyngeal Swab; Nasopharyngeal(NP) swabs in vial transport medium  Result Value Ref Range Status   SARS Coronavirus 2 by RT PCR POSITIVE (Teandra Harlan) NEGATIVE Final    Comment: RESULT CALLED TO, READ BACK BY AND VERIFIED WITH: J COOK '@[id number]'  07/10/21 EB (NOTE) SARS-CoV-2 target nucleic acids are DETECTED.  The SARS-CoV-2 RNA is generally detectable in upper respiratory specimens during the acute phase of infection. Positive results are indicative of the presence of the identified virus, but do not rule out bacterial infection or co-infection with other pathogens not detected by the test. Clinical correlation with patient history and other diagnostic information is necessary to  determine patient infection status. The expected result is Negative.  Fact Sheet for Patients: EntrepreneurPulse.com.au  Fact Sheet for Healthcare Providers: IncredibleEmployment.be  This test is not yet approved or cleared by the [place] FDA and  has been authorized for detection and/or diagnosis of SARS-CoV-2 by FDA under an Emergency Use Authorization (EUA).  This EUA will remain in effect (meaning this test can be used) for  the duration of  the COVID-19 declaration under Section 564(b)(1) of the Act, 21 U.S.C. section 360bbb-3(b)(1), unless the authorization is terminated or revoked sooner.     Influenza Camdon Saetern by PCR NEGATIVE NEGATIVE Final   Influenza B by PCR NEGATIVE NEGATIVE Final    Comment: (NOTE) The Xpert Xpress SARS-CoV-2/FLU/RSV plus assay is intended as an aid in the diagnosis of influenza from Nasopharyngeal swab  specimens and should not be used as Woodie Trusty sole basis for treatment. Nasal washings and aspirates are unacceptable for Xpert Xpress SARS-CoV-2/FLU/RSV testing.  Fact Sheet for Patients: EntrepreneurPulse.com.au  Fact Sheet for Healthcare Providers: IncredibleEmployment.be  This test is not yet approved or cleared by the [place] FDA and has been authorized for detection and/or diagnosis of SARS-CoV-2 by FDA under an Emergency Use Authorization (EUA). This EUA will remain in effect (meaning this test can be used) for the duration of the COVID-19 declaration under Section 564(b)(1) of the Act, 21 U.S.C. section 360bbb-3(b)(1), unless the authorization is terminated or revoked.  Performed at [facility], [place] [address] [place], [place] [address]      Labs: Basic Metabolic Panel: Recent Labs  Lab 07/09/21 1210 07/11/21 0555  NA 132* 133*  K 4.3 4.2  CL 90* 93*  CO2 23 25  GLUCOSE 85 88  BUN 85* 59*  CREATININE 16.90* 13.97*  CALCIUM 8.8* 8.2*   Liver  Function Tests: Recent Labs  Lab 07/11/21 0555  AST 18  ALT 10  ALKPHOS 71  BILITOT 0.5  PROT 8.5*  ALBUMIN 3.3*   No results for input(s): LIPASE, AMYLASE in the last 168 hours. No results for input(s): AMMONIA in the last 168 hours. CBC: Recent Labs  Lab 07/09/21 1210 07/11/21 0555  WBC 8.8 4.4  NEUTROABS 6.2  --   HGB 13.0 13.5  HCT 39.9 40.0  MCV 95.0 92.4  PLT 170 171   Cardiac Enzymes: No results for input(s): CKTOTAL, CKMB, CKMBINDEX, TROPONINI in the last 168 hours. BNP: BNP (last 3 results) No results for input(s): BNP in the last 8760 hours.  ProBNP (last 3 results) No results for input(s): PROBNP in the last 8760 hours.  CBG: No results for input(s): GLUCAP in the last 168 hours.     Signed:  Fayrene Helper MD.  [facility] 07/11/2021, 10:44 AM

## 2021-07-12 ENCOUNTER — Telehealth: Payer: Self-pay | Admitting: Nephrology

## 2021-07-12 NOTE — Telephone Encounter (Signed)
Transition of care contact from inpatient facility  Date of Discharge: 07/11/21 Date of Contact: 07/12/21 Method of contact: Phone  Attempted to contact patient to discuss transition of care from inpatient admission. Patient did not answer the phone. Unable to leave voicemail. Mailbox full.

## 2021-07-13 ENCOUNTER — Telehealth (HOSPITAL_COMMUNITY): Payer: Self-pay

## 2021-07-13 NOTE — Telephone Encounter (Signed)
Called to schedule declot, no answer, vm full. AW

## 2021-07-14 ENCOUNTER — Other Ambulatory Visit: Payer: Self-pay | Admitting: Radiology

## 2021-07-14 ENCOUNTER — Other Ambulatory Visit: Payer: Self-pay | Admitting: Student

## 2021-07-14 MED ORDER — CONTRAST ALLERGY PREMED PACK 3 X 50 MG & 1 X 50 MG PO KIT: 1.0000 | PACK | Freq: Once | ORAL | 0 refills | Status: DC

## 2021-07-15 ENCOUNTER — Other Ambulatory Visit (HOSPITAL_COMMUNITY): Payer: Self-pay | Admitting: Nephrology

## 2021-07-15 ENCOUNTER — Other Ambulatory Visit: Payer: Self-pay

## 2021-07-15 ENCOUNTER — Encounter (HOSPITAL_COMMUNITY): Payer: Self-pay

## 2021-07-15 ENCOUNTER — Ambulatory Visit (HOSPITAL_COMMUNITY)
Admission: RE | Admit: 2021-07-15 | Discharge: 2021-07-15 | Disposition: A | Discharge status: Home / Self Care | Payer: Self-pay | Source: Ambulatory Visit | Attending: Nephrology | Admitting: Nephrology

## 2021-07-15 DIAGNOSIS — E039 Hypothyroidism, unspecified: Secondary | ICD-10-CM | POA: Insufficient documentation

## 2021-07-15 DIAGNOSIS — Y841 Kidney dialysis as the cause of abnormal reaction of the patient, or of later complication, without mention of misadventure at the time of the procedure: Secondary | ICD-10-CM | POA: Insufficient documentation

## 2021-07-15 DIAGNOSIS — I120 Hypertensive chronic kidney disease with stage 5 chronic kidney disease or end stage renal disease: Secondary | ICD-10-CM | POA: Insufficient documentation

## 2021-07-15 DIAGNOSIS — F419 Anxiety disorder, unspecified: Secondary | ICD-10-CM | POA: Insufficient documentation

## 2021-07-15 DIAGNOSIS — F32A Depression, unspecified: Secondary | ICD-10-CM | POA: Insufficient documentation

## 2021-07-15 DIAGNOSIS — Z888 Allergy status to other drugs, medicaments and biological substances status: Secondary | ICD-10-CM | POA: Insufficient documentation

## 2021-07-15 DIAGNOSIS — N186 End stage renal disease: Secondary | ICD-10-CM

## 2021-07-15 DIAGNOSIS — Z87891 Personal history of nicotine dependence: Secondary | ICD-10-CM | POA: Insufficient documentation

## 2021-07-15 DIAGNOSIS — Z91041 Radiographic dye allergy status: Secondary | ICD-10-CM | POA: Insufficient documentation

## 2021-07-15 DIAGNOSIS — T82868A Thrombosis of vascular prosthetic devices, implants and grafts, initial encounter: Secondary | ICD-10-CM | POA: Insufficient documentation

## 2021-07-15 DIAGNOSIS — Z79899 Other long term (current) drug therapy: Secondary | ICD-10-CM | POA: Insufficient documentation

## 2021-07-15 DIAGNOSIS — Z992 Dependence on renal dialysis: Secondary | ICD-10-CM | POA: Insufficient documentation

## 2021-07-15 HISTORY — PX: IR THROMBECTOMY AV FISTULA W/THROMBOLYSIS/PTA INC/SHUNT/IMG RIGHT: IMG6119

## 2021-07-15 MED ORDER — HEPARIN SODIUM (PORCINE) 1000 UNIT/ML IJ SOLN
INTRAMUSCULAR | Status: AC
  Filled 2021-07-15: qty 1

## 2021-07-15 MED ORDER — LIDOCAINE HCL 1 % IJ SOLN
INTRAMUSCULAR | Status: DC | PRN
  Administered 2021-07-15: 10 mL via INTRADERMAL

## 2021-07-15 MED ORDER — ALTEPLASE 2 MG IJ SOLR
INTRAMUSCULAR | Status: AC
  Filled 2021-07-15: qty 2

## 2021-07-15 MED ORDER — FENTANYL CITRATE (PF) 100 MCG/2ML IJ SOLN
INTRAMUSCULAR | Status: AC
  Filled 2021-07-15: qty 2

## 2021-07-15 MED ORDER — HEPARIN SODIUM (PORCINE) 1000 UNIT/ML IJ SOLN
INTRAMUSCULAR | Status: DC | PRN
  Administered 2021-07-15: 3000 [IU] via INTRAVENOUS

## 2021-07-15 MED ORDER — FENTANYL CITRATE (PF) 100 MCG/2ML IJ SOLN
INTRAMUSCULAR | Status: DC | PRN
  Administered 2021-07-15 (×2): 25 ug via INTRAVENOUS
  Administered 2021-07-15: 50 ug via INTRAVENOUS
  Administered 2021-07-15 (×2): 25 ug via INTRAVENOUS

## 2021-07-15 MED ORDER — SODIUM CHLORIDE 0.9 % IV SOLN: INTRAVENOUS | Status: DC

## 2021-07-15 MED ORDER — MIDAZOLAM HCL 2 MG/2ML IJ SOLN
INTRAMUSCULAR | Status: AC
  Filled 2021-07-15: qty 2

## 2021-07-15 MED ORDER — ALTEPLASE 2 MG IJ SOLR
INTRAMUSCULAR | Status: DC | PRN
  Administered 2021-07-15: 2 mg

## 2021-07-15 MED ORDER — MIDAZOLAM HCL 2 MG/2ML IJ SOLN
INTRAMUSCULAR | Status: DC | PRN
  Administered 2021-07-15 (×4): .5 mg via INTRAVENOUS
  Administered 2021-07-15: 1 mg via INTRAVENOUS

## 2021-07-15 MED ORDER — LIDOCAINE HCL 1 % IJ SOLN
INTRAMUSCULAR | Status: AC
  Filled 2021-07-15: qty 20

## 2021-07-15 MED ORDER — HYDROCODONE-ACETAMINOPHEN 5-325 MG PO TABS: 1.0000 | ORAL_TABLET | ORAL | Status: DC | PRN

## 2021-07-15 MED ORDER — IOHEXOL 300 MG/ML  SOLN
100.0000 mL | Freq: Once | INTRAMUSCULAR | Status: AC | PRN
  Administered 2021-07-15: 40 mL

## 2021-07-15 NOTE — Progress Notes (Deleted)
Chief Complaint: Patient was seen in consultation today for right thigh AVG shuntogram with possible intervention at the request of [name]  Referring Physician(s): [name]  Supervising Physician: Arne Cleveland  Patient Status: [facility] - Out-pt  History of Present Illness: [name] is a 50 y.o. 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 Patient is known to our service for previous shuntogram with interventions. IR was requested for right thigh AVG shuntogram on 07/10/2021; however, patient is allergic to contrast media when he is 13-hour prep for contrast allergy; therefore IR was not able to accommodate the patient over the weekend.  Patient underwent tunneled dialysis catheter placement on 07/10/2021 and was hospitalized overnight for urgent dialysis.  Patient was also tested positive for COVID-19, it was determined that the positive test result was due to recent infection in August 2022.   Patient discharged on 07/11/2021 with outpatient referal to IR for right thigh AVG shuntogram with possible intervention.   IR was requested for shuntogram of the right thigh AVG with possible intervention including angioplasty, declot, and possible tunneled hemodialysis catheter removal.  Patient laying in bed, not in acute distress.  Patient states that he took the contrast allergy prep medications as directed. Denise headache, fever, chills, shortness of breath, cough, chest pain, abdominal pain, nausea ,vomiting, and bleeding.   Past Medical History:  Diagnosis Date   Anemia    Anxiety    Depression    ESRD on hemodialysis ([place])    HD [place] MWF   Hypertension    Thyroid disease     Past Surgical History:  Procedure Laterality Date   ARTERIOVENOUS GRAFT PLACEMENT Left    "forearm, it's not working; thigh"    ARTERIOVENOUS  GRAFT PLACEMENT Left 11/09/2015   AV FISTULA PLACEMENT Right 01/10/2017   Procedure: INSERTION OF ARTERIOVENOUS Right thigh GORE-TEX GRAFT;  Surgeon: [name], MD;  Location: [place];  Service: Vascular;  Laterality: Right;   FALSE ANEURYSM REPAIR Left 11/09/2015   Procedure: REPAIR OF LEFT FEMORAL PSEUDOANEURYSM; REVISION  OF LEFT THIGH ARTERIOVENOUS GRAFT USING 6MM X 10 CM GORETEX GRAFT ;  Surgeon: [name], MD;  Location: [facility] OR;  Service: Vascular;  Laterality: Left;   IR AV DIALY SHUNT INTRO NEEDLE/INTRACATH INITIAL W/PTA/IMG RIGHT Right 02/06/2019   IR AV DIALY SHUNT INTRO NEEDLE/INTRACATH INITIAL W/PTA/IMG RIGHT Right 05/14/2019   IR AV DIALY SHUNT INTRO NEEDLE/INTRACATH INITIAL W/PTA/IMG RIGHT Right 08/18/2020   IR DIALY SHUNT INTRO NEEDLE/INTRACATH INITIAL W/IMG RIGHT Right 09/17/2019   IR DIALY SHUNT INTRO NEEDLE/INTRACATH INITIAL W/IMG RIGHT Right 12/10/2019   IR FLUORO GUIDE CV LINE RIGHT  07/10/2021   IR GENERIC HISTORICAL  07/15/2016   IR US GUIDE VASC ACCESS LEFT 07/15/2016 [name], MD [facility]   IR GENERIC HISTORICAL Left 07/15/2016   IR THROMBECTOMY AV FISTULA W/THROMBOLYSIS/PTA INC/SHUNT/IMG LEFT 07/15/2016 [name], MD [facility]   IR GENERIC HISTORICAL  10/05/2016   IR US GUIDE VASC ACCESS LEFT 10/05/2016 [name], MD [facility]   IR GENERIC HISTORICAL Left 10/05/2016   IR THROMBECTOMY AV FISTULA W/THROMBOLYSIS/PTA INC/SHUNT/IMG LEFT 10/05/2016 [name], MD [facility]   IR GENERIC HISTORICAL  10/08/2016   IR FLUORO GUIDE CV LINE RIGHT 10/08/2016 [name], MD [facility]   IR GENERIC HISTORICAL  10/08/2016   IR US GUIDE VASC ACCESS RIGHT 10/08/2016 [name],  MD [facility]   IR PTA AND STENT ADDL CENTRAL DIALY SEG THRU DIALY CIRCUIT RIGHT Right 12/10/2019   IR RADIOLOGIST EVAL & MGMT  11/26/2019   IR REMOVAL TUN CV CATH W/O FL  03/16/2017   IR THROMBECTOMY AV FISTULA W/THROMBOLYSIS/PTA INC/SHUNT/IMG RIGHT Right 06/16/2018   IR THROMBECTOMY AV  FISTULA W/THROMBOLYSIS/PTA INC/SHUNT/IMG RIGHT Right 06/17/2018   IR US GUIDE VASC ACCESS RIGHT  06/17/2018   IR US GUIDE VASC ACCESS RIGHT  06/16/2018   IR US GUIDE VASC ACCESS RIGHT  02/06/2019   IR US GUIDE VASC ACCESS RIGHT  05/14/2019   IR US GUIDE VASC ACCESS RIGHT  09/17/2019   IR US GUIDE VASC ACCESS RIGHT  12/10/2019   IR US GUIDE VASC ACCESS RIGHT  08/18/2020   IR US GUIDE VASC ACCESS RIGHT  07/10/2021   PARATHYROIDECTOMY N/A 04/24/2014   Procedure: TOTAL 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;  Surgeon: [name], MD;  Location: [place];  Service: General;  Laterality: N/A;  NECK AND LEFT FOREARM   PERIPHERAL VASCULAR CATHETERIZATION N/A 05/05/2016   Procedure: A/V Shuntogram;  Surgeon: Conrad Guthrie, MD;  Location: [place] CV LAB;  Service: Cardiovascular;  Laterality: N/A;   PERIPHERAL VASCULAR CATHETERIZATION Left 05/05/2016   Procedure: Peripheral Vascular Balloon Angioplasty;  Surgeon: Conrad Lake Heritage, MD;  Location: [place] CV LAB;  Service: Cardiovascular;  Laterality: Left;   PSEUDOANEURYSM REPAIR Left 11/09/2015   TEE WITHOUT CARDIOVERSION N/A 02/19/2021   Procedure: TRANSESOPHAGEAL ECHOCARDIOGRAM (TEE);  Surgeon: [name], MD;  Location: [facility] ENDOSCOPY;  Service: Cardiovascular;  Laterality: N/A;   THROMBECTOMY / ARTERIOVENOUS GRAFT REVISION Left 08/18/2015   thigh   THROMBECTOMY AND REVISION OF ARTERIOVENTOUS (AV) GORETEX  GRAFT Left 08/23/2015   Procedure: THROMBECTOMY AND REVISION OF ARTERIOVENTOUS (AV) GORETEX  GRAFT LEFT THIGH ;  Surgeon: [name], MD;  Location: [place];  Service: Vascular;  Laterality: Left;   THROMBECTOMY W/ EMBOLECTOMY Left 08/18/2015   Procedure: THROMBECTOMY  AND REVISION ARTERIOVENOUS GORE-TEX GRAFT/LEFT THIGH;  Surgeon: [name], MD;  Location: [place];  Service: Vascular;  Laterality: Left;   THROMBECTOMY W/ EMBOLECTOMY Right 06/19/2018   Procedure: THROMBECTOMY AND REVISION OF RIGHT THIGH  ARTERIOVENOUS GORE-TEX  GRAFT;  Surgeon: [name], MD;  Location: [facility] OR;  Service: Vascular;  Laterality: Right;   UPPER EXTREMITY VENOGRAPHY Bilateral 12/27/2016   Procedure: Upper Extremity Venography;  Surgeon: [name], MD;  Location: [place] CV LAB;  Service: Cardiovascular;  Laterality: Bilateral;   VENOGRAM Left 05/05/2016   Procedure: Venogram;  Surgeon: Conrad [place], MD;  Location: [place] CV LAB;  Service: Cardiovascular;  Laterality: Left;  lower extremity    Allergies: Ivp dye [iodinated diagnostic agents], Lisinopril, and Solu-medrol [methylprednisolone]  Medications: Prior to Admission medications   Medication Sig Start Date End Date Taking? Authorizing Provider  acetaminophen (TYLENOL) 500 MG tablet Take 1,000 mg by mouth daily as needed (for pain or headaches).    Yes [provider]  amLODipine (NORVASC) 10 MG tablet Take 10 mg by mouth at bedtime.   Yes [provider]  B Complex-C-Folic Acid (DIALYVITE TABLET) TABS Take 1 tablet by mouth daily. 06/29/20  Yes [provider]  cloNIDine (CATAPRES) 0.2 MG tablet Take 0.5 tablets (0.1 mg total) by mouth daily. 07/11/21  Yes Elodia Florence., MD  diphenhydrAMINE (BENADRYL) 50 MG tablet Take 50 mg by mouth once. Took at 10;00 07/15/21 07/15/21 Yes [provider]  predniSONE (DELTASONE) 50 MG tablet Take 50 mg by mouth. 10/12 dose at 2300, 10/13 _0  and at 10;00 07/14/21 07/15/21 Yes [provider]  albuterol (VENTOLIN HFA) 108 (90 Base) MCG/ACT inhaler Inhale 1-2 puffs into the lungs every 6 (six) hours as needed for wheezing. 02/09/21   Larene Pickett, PA-C  lidocaine-prilocaine (EMLA) cream Apply 1 application topically daily as needed (port access).  02/03/20   [provider]  TUMS ULTRA 1000 1000 MG chewable tablet Chew 4,000 mg by mouth 3 (three) times daily with meals. 03/14/20   [provider]  fluticasone (FLONASE) 50 MCG/ACT nasal spray Place 2 sprays  into both nostrils daily. Patient not taking: Reported on 06/15/2018 07/25/17 02/20/20  Larene Pickett, PA-C     Family History  Problem Relation Age of Onset   Renal Disease Neg Hx     Social History   Socioeconomic History   Marital status: Married    Spouse name: Not on file   Number of children: Not on file   Years of education: Not on file   Highest education level: Not on file  Occupational History   Not on file  Tobacco Use   Smoking status: Former    Types: Cigarettes    Quit date: 03/21/1986    Years since quitting: 35.3   Smokeless tobacco: Never  Substance and Sexual Activity   Alcohol use: No    Alcohol/week: 0.0 standard drinks   Drug use: No   Sexual activity: Not on file  Other Topics Concern   Not on file  Social History Narrative   Not on file   Social Determinants of Health   Financial Resource Strain: Not on file  Food Insecurity: Not on file  Transportation Needs: Not on file  Physical Activity: Not on file  Stress: Not on file  Social Connections: Not on file     Review of Systems: A 12 point ROS discussed and pertinent positives are indicated in the HPI above.  All other systems are negative.  Vital Signs: BP (!) 125/97   Pulse 91   Temp (!) 97.5 F (36.4 C) (Oral)   Resp 15   Ht 6' (1.829 m)   Wt 159 lb 13.3 oz (72.5 kg)   SpO2 100%   BMI 21.68 kg/m   Physical Exam Vitals and nursing note reviewed.  Constitutional:      General: Patient is not in acute distress.    Appearance: Normal appearance. Patient is not ill-appearing.  HENT:     Head: Normocephalic and atraumatic.     Mouth/Throat:     Mouth: Mucous membranes are moist.     Pharynx: Oropharynx is clear.  Cardiovascular:     Rate and Rhythm: Normal rate and regular rhythm.     Pulses: Normal pulses.     Heart sounds: Normal heart sounds.    Positive for right IJ TDC.  Pulmonary:     Effort: Pulmonary effort is normal.     Breath sounds: Normal breath sounds.   Abdominal:     General: Abdomen is flat. Bowel sounds are normal.     Palpations: Abdomen is soft.  Musculoskeletal:     Cervical back: Neck supple.  Skin:    General: Skin is warm and dry.     Coloration: Skin is not jaundiced or pale.  Neurological:     Mental Status: Patient is alert and oriented to person, place, and time.  Psychiatric:  Mood and Affect: Mood normal.        Behavior: Behavior normal.        Judgment: Judgment normal.    MD Evaluation Airway: WNL Heart: WNL Abdomen: WNL Chest/ Lungs: WNL ASA  Classification: 3 Mallampati/Airway Score: Two  Imaging: IR Fluoro Guide CV Line Right  Result Date: 07/10/2021 INDICATION: 50 year old male with a history of renal failure, thrombosed right femoral loop graft, contrast reaction history with need for premedication, presents for tunneled hemodialysis catheter EXAM: IMAGE GUIDED PLACEMENT TUNNELED HEMODIALYSIS CATHETER MEDICATIONS: 2 g Ancef; The antibiotic was administered within an appropriate time interval prior to skin puncture. ANESTHESIA/SEDATION: Moderate (conscious) sedation was employed during this procedure. A total of Versed 1.0 mg and Fentanyl 50 mcg was administered intravenously. Moderate Sedation Time: 25 minutes. The patient's level of consciousness and vital signs were monitored continuously by radiology nursing throughout the procedure under my direct supervision. FLUOROSCOPY TIME:  Fluoroscopy Time: 0 minutes 36 seconds (1 mGy). COMPLICATIONS: None immediate. PROCEDURE: Informed written consent was obtained from the patient after a discussion of the risks, benefits, and alternatives to treatment. Questions regarding the procedure were encouraged and answered. The right neck and chest were prepped with chlorhexidine in a sterile fashion, and a sterile drape was applied covering the operative field. Maximum barrier sterile technique with sterile gowns and gloves were used for the procedure. A timeout was  performed prior to the initiation of the procedure. Ultrasound survey was performed. Micropuncture kit was utilized to access the right internal jugular vein under direct, real-time ultrasound guidance after the overlying soft tissues were anesthetized with 1% lidocaine with epinephrine. Stab incision was made with 11 blade scalpel. Microwire was passed centrally. Note that during manipulation of the wire, a significant stenosis of the superior vena cava was encountered. Prior CT 02/15/2021 demonstrates a partially calcified fibrin sheath remnant/chronic calcified venous thrombus at this site. The microwire was then marked to measure appropriate internal catheter length. External tunneled length was estimated. A total tip to cuff length of 23 cm was selected. 035 guidewire was advanced to the level of the IVC. Skin and subcutaneous tissues of chest wall below the clavicle were generously infiltrated with 1% lidocaine for local anesthesia. A small stab incision was made with 11 blade scalpel. The selected hemodialysis catheter was tunneled in a retrograde fashion from the anterior chest wall to the venotomy incision. Serial dilation was performed and then a peel-away sheath was placed. The catheter was then placed through the peel-away sheath with tips ultimately positioned within the superior aspect of the right atrium. Final catheter positioning was confirmed and documented with a spot radiographic image. The catheter aspirates and flushes normally. The catheter was flushed with appropriate volume heparin dwells. The catheter exit site was secured with a 0-Prolene retention suture. Gel-Foam slurry was infused into the soft tissue tract. The venotomy incision was closed Derma bond and sterile dressing. Dressings were applied at the chest wall. Patient tolerated the procedure well and remained hemodynamically stable throughout. No complications were encountered and no significant blood loss encountered. IMPRESSION:  Status post right IJ tunneled hemodialysis catheter. Significant stenosis of the superior vena cava, secondary to partially calcified fibrin sheath/chronic changes identified on prior CT. Signed, Dulcy Fanny. Dellia Nims, RPVI [facility] Radiology PLAN: The patient will return for attempt at thrombectomy of the right femoral loop graft, with premedication for his documented contrast reaction history. At the time of a future catheter removal, would advise consideration of  IVUS evaluation and/or empiric balloon angioplasty of what will be a significantly stenotic SVC, given the presence of the partially calcified fibrin sheath on prior CT. Electronically Signed   By: [name] D.O.   On: 07/10/2021 12:39   IR US Guide Vasc Access Right  Result Date: 07/10/2021 INDICATION: 50 year old male with a history of renal failure, thrombosed right femoral loop graft, contrast reaction history with need for premedication, presents for tunneled hemodialysis catheter EXAM: IMAGE GUIDED PLACEMENT TUNNELED HEMODIALYSIS CATHETER MEDICATIONS: 2 g Ancef; The antibiotic was administered within an appropriate time interval prior to skin puncture. ANESTHESIA/SEDATION: Moderate (conscious) sedation was employed during this procedure. A total of Versed 1.0 mg and Fentanyl 50 mcg was administered intravenously. Moderate Sedation Time: 25 minutes. The patient's level of consciousness and vital signs were monitored continuously by radiology nursing throughout the procedure under my direct supervision. FLUOROSCOPY TIME:  Fluoroscopy Time: 0 minutes 36 seconds (1 mGy). COMPLICATIONS: None immediate. PROCEDURE: Informed written consent was obtained from the patient after a discussion of the risks, benefits, and alternatives to treatment. Questions regarding the procedure were encouraged and answered. The right neck and chest were prepped with chlorhexidine in a sterile fashion, and a sterile  drape was applied covering the operative field. Maximum barrier sterile technique with sterile gowns and gloves were used for the procedure. A timeout was performed prior to the initiation of the procedure. Ultrasound survey was performed. Micropuncture kit was utilized to access the right internal jugular vein under direct, real-time ultrasound guidance after the overlying soft tissues were anesthetized with 1% lidocaine with epinephrine. Stab incision was made with 11 blade scalpel. Microwire was passed centrally. Note that during manipulation of the wire, a significant stenosis of the superior vena cava was encountered. Prior CT 02/15/2021 demonstrates a partially calcified fibrin sheath remnant/chronic calcified venous thrombus at this site. The microwire was then marked to measure appropriate internal catheter length. External tunneled length was estimated. A total tip to cuff length of 23 cm was selected. 035 guidewire was advanced to the level of the IVC. Skin and subcutaneous tissues of chest wall below the clavicle were generously infiltrated with 1% lidocaine for local anesthesia. A small stab incision was made with 11 blade scalpel. The selected hemodialysis catheter was tunneled in a retrograde fashion from the anterior chest wall to the venotomy incision. Serial dilation was performed and then a peel-away sheath was placed. The catheter was then placed through the peel-away sheath with tips ultimately positioned within the superior aspect of the right atrium. Final catheter positioning was confirmed and documented with a spot radiographic image. The catheter aspirates and flushes normally. The catheter was flushed with appropriate volume heparin dwells. The catheter exit site was secured with a 0-Prolene retention suture. Gel-Foam slurry was infused into the soft tissue tract. The venotomy incision was closed Derma bond and sterile dressing. Dressings were applied at the chest wall. Patient tolerated  the procedure well and remained hemodynamically stable throughout. No complications were encountered and no significant blood loss encountered. IMPRESSION: Status post right IJ tunneled hemodialysis catheter. Significant stenosis of the superior vena cava, secondary to partially calcified fibrin sheath/chronic changes identified on prior CT. Signed, Dulcy Fanny. Dellia Nims, RPVI [facility] Inter[facility] Radiology Special[facility] Radiology PLAN: The patient will return for attempt at thrombectomy of the right femoral loop graft, with premedication for his documented contrast reaction history. At the time of a future catheter removal, would advise consideration of IVUS evaluation and/or empiric balloon angioplasty  of what will be a significantly stenotic SVC, given the presence of the partially calcified fibrin sheath on prior CT. Electronically Signed   By: [name] D.O.   On: 07/10/2021 12:39   DG CHEST PORT 1 VIEW  Result Date: 07/10/2021 CLINICAL DATA:  COVID positive today. EXAM: PORTABLE CHEST 1 VIEW COMPARISON:  May 27, 2021 FINDINGS: The heart size and mediastinal contours are stable. The aorta is tortuous. Both lungs are clear. The visualized skeletal structures are unremarkable. Degenerative joint changes of bilateral acromioclavicular joints are noted. IMPRESSION: No active disease. Electronically Signed   By: Abelardo Diesel M.D.   On: 07/10/2021 10:39    Labs:  CBC: Recent Labs    02/19/21 0840 05/27/21 0347 05/27/21 0406 07/09/21 1210 07/11/21 0555  WBC 7.6 6.7  --  8.8 4.4  HGB 10.2* 13.4 11.9* 13.0 13.5  HCT 31.7* 39.7 35.0* 39.9 40.0  PLT 381 156  --  170 171    COAGS: Recent Labs    05/27/21 0347  INR 1.0    BMP: Recent Labs    05/27/21 0455 05/27/21 0720 07/09/21 1210 07/11/21 0555  NA 135 135 132* 133*  K 6.8* >7.5* 4.3 4.2  CL 93* 94* 90* 93*  CO2 22 21* 23 25  GLUCOSE 151* 71 85 88  BUN 84* 82* 85* 59*  CALCIUM 8.1* 8.2* 8.8* 8.2*  CREATININE  17.64* 17.84* 16.90* 13.97*  GFRNONAA 3* 3* 3* 4*    LIVER FUNCTION TESTS: Recent Labs    02/14/21 2022 02/17/21 0512 02/19/21 0841 05/27/21 0347 05/27/21 0720 07/11/21 0555  BILITOT 1.4*  --   --  0.8  --  0.5  AST 15  --   --  25  --  18  ALT 16  --   --  14  --  10  ALKPHOS 94  --   --  65  --  71  PROT 6.6  --   --  8.6*  --  8.5*  ALBUMIN 3.0*   < > 2.6* 4.1 3.9 3.3*   < > = values in this interval not displayed.    TUMOR MARKERS: No results for input(s): AFPTM, CEA, CA199, CHROMGRNA in the last 8760 hours.  Assessment and Plan: 50 y.o. 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 placement with Dr. Earleen Newport on 07/10/2021 due to non functioning AVG. Patient was hospitalized overnight for urgent dialysis and was discharged on 07/11/2021 with outpatient referral to IR for right thigh AVG shuntogram and possible intervention.  Patient presents to [facility] IR today for the procedure. N.p.o. since midnight VSS Recent CBC and CMP on 07/11/2021 all at baseline Not on AC/AP treatment  Patient is allergic to contrast media, causes hives and neck swelling. Patient states that he took the contrast allergy premedications as directed.  Risks and benefits discussed with the patient including, but not limited to bleeding, infection, vascular injury, pulmonary embolism, need for tunneled HD catheter placement or even death.  All of the patient's questions were answered, patient is agreeable to proceed. Consent signed and in chart.    Thank you for this interesting consult.  I greatly enjoyed meeting [name] and look forward to participating in their care.  A copy of this report was sent to the requesting provider on this date.  Electronically Signed: Tera Mater, PA-C 07/15/2021, 10:40 AM   I spent a total of    25 Minutes in  face to face in clinical consultation, greater than 50% of which was counseling/coordinating care for right thigh AVG  shuntogram with possible intervention and possible TDC removal.  This chart was dictated using voice recognition software.  Despite best efforts to proofread,  errors can occur which can change the documentation meaning.

## 2021-07-15 NOTE — H&P (Signed)
Chief Complaint: Patient was seen in consultation today for right thigh AVG shuntogram with possible intervention at the request of [name]  Referring Physician(s): [name]  Supervising Physician: Arne Cleveland  Patient Status: [facility] - Out-pt  History of Present Illness: [name] is a 50 y.o. 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 Patient is known to our service for previous shuntogram with interventions. IR was requested for right thigh AVG shuntogram on 07/10/2021; however, patient is allergic to contrast media when he is 13-hour prep for contrast allergy; therefore IR was not able to accommodate the patient over the weekend.  Patient underwent tunneled dialysis catheter placement on 07/10/2021 and was hospitalized overnight for urgent dialysis.  Patient was also tested positive for COVID-19, it was determined that the positive test result was due to recent infection in August 2022.   Patient discharged on 07/11/2021 with outpatient referal to IR for right thigh AVG shuntogram with possible intervention.   IR was requested for shuntogram of the right thigh AVG with possible intervention including angioplasty, declot, and possible tunneled hemodialysis catheter removal.  Patient laying in bed, not in acute distress.  Patient states that he took the contrast allergy prep medications as directed. Denise headache, fever, chills, shortness of breath, cough, chest pain, abdominal pain, nausea ,vomiting, and bleeding.   Past Medical History:  Diagnosis Date   Anemia    Anxiety    Depression    ESRD on hemodialysis ([place])    HD [place] MWF   Hypertension    Thyroid disease     Past Surgical History:  Procedure Laterality Date   ARTERIOVENOUS GRAFT PLACEMENT Left    "forearm, it's not working; thigh"    ARTERIOVENOUS  GRAFT PLACEMENT Left 11/09/2015   AV FISTULA PLACEMENT Right 01/10/2017   Procedure: INSERTION OF ARTERIOVENOUS Right thigh GORE-TEX GRAFT;  Surgeon: [name], MD;  Location: [place];  Service: Vascular;  Laterality: Right;   FALSE ANEURYSM REPAIR Left 11/09/2015   Procedure: REPAIR OF LEFT FEMORAL PSEUDOANEURYSM; REVISION  OF LEFT THIGH ARTERIOVENOUS GRAFT USING 6MM X 10 CM GORETEX GRAFT ;  Surgeon: [name], MD;  Location: [facility] OR;  Service: Vascular;  Laterality: Left;   IR AV DIALY SHUNT INTRO NEEDLE/INTRACATH INITIAL W/PTA/IMG RIGHT Right 02/06/2019   IR AV DIALY SHUNT INTRO NEEDLE/INTRACATH INITIAL W/PTA/IMG RIGHT Right 05/14/2019   IR AV DIALY SHUNT INTRO NEEDLE/INTRACATH INITIAL W/PTA/IMG RIGHT Right 08/18/2020   IR DIALY SHUNT INTRO NEEDLE/INTRACATH INITIAL W/IMG RIGHT Right 09/17/2019   IR DIALY SHUNT INTRO NEEDLE/INTRACATH INITIAL W/IMG RIGHT Right 12/10/2019   IR FLUORO GUIDE CV LINE RIGHT  07/10/2021   IR GENERIC HISTORICAL  07/15/2016   IR US GUIDE VASC ACCESS LEFT 07/15/2016 [name], MD [facility]   IR GENERIC HISTORICAL Left 07/15/2016   IR THROMBECTOMY AV FISTULA W/THROMBOLYSIS/PTA INC/SHUNT/IMG LEFT 07/15/2016 [name], MD [facility]   IR GENERIC HISTORICAL  10/05/2016   IR US GUIDE VASC ACCESS LEFT 10/05/2016 [name], MD [facility]   IR GENERIC HISTORICAL Left 10/05/2016   IR THROMBECTOMY AV FISTULA W/THROMBOLYSIS/PTA INC/SHUNT/IMG LEFT 10/05/2016 [name], MD [facility]   IR GENERIC HISTORICAL  10/08/2016   IR FLUORO GUIDE CV LINE RIGHT 10/08/2016 [name], MD [facility]   IR GENERIC HISTORICAL  10/08/2016   IR US GUIDE VASC ACCESS RIGHT 10/08/2016 [name],  MD [facility]   IR PTA AND STENT ADDL CENTRAL DIALY SEG THRU DIALY CIRCUIT RIGHT Right 12/10/2019   IR RADIOLOGIST EVAL & MGMT  11/26/2019   IR REMOVAL TUN CV CATH W/O FL  03/16/2017   IR THROMBECTOMY AV FISTULA W/THROMBOLYSIS/PTA INC/SHUNT/IMG RIGHT Right 06/16/2018   IR THROMBECTOMY AV  FISTULA W/THROMBOLYSIS/PTA INC/SHUNT/IMG RIGHT Right 06/17/2018   IR US GUIDE VASC ACCESS RIGHT  06/17/2018   IR US GUIDE VASC ACCESS RIGHT  06/16/2018   IR US GUIDE VASC ACCESS RIGHT  02/06/2019   IR US GUIDE VASC ACCESS RIGHT  05/14/2019   IR US GUIDE VASC ACCESS RIGHT  09/17/2019   IR US GUIDE VASC ACCESS RIGHT  12/10/2019   IR US GUIDE VASC ACCESS RIGHT  08/18/2020   IR US GUIDE VASC ACCESS RIGHT  07/10/2021   PARATHYROIDECTOMY N/A 04/24/2014   Procedure: TOTAL 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;  Surgeon: [name], MD;  Location: [place];  Service: General;  Laterality: N/A;  NECK AND LEFT FOREARM   PERIPHERAL VASCULAR CATHETERIZATION N/A 05/05/2016   Procedure: A/V Shuntogram;  Surgeon: Conrad Bartow, MD;  Location: [place] CV LAB;  Service: Cardiovascular;  Laterality: N/A;   PERIPHERAL VASCULAR CATHETERIZATION Left 05/05/2016   Procedure: Peripheral Vascular Balloon Angioplasty;  Surgeon: Conrad Gibbstown, MD;  Location: [place] CV LAB;  Service: Cardiovascular;  Laterality: Left;   PSEUDOANEURYSM REPAIR Left 11/09/2015   TEE WITHOUT CARDIOVERSION N/A 02/19/2021   Procedure: TRANSESOPHAGEAL ECHOCARDIOGRAM (TEE);  Surgeon: [name], MD;  Location: [facility] ENDOSCOPY;  Service: Cardiovascular;  Laterality: N/A;   THROMBECTOMY / ARTERIOVENOUS GRAFT REVISION Left 08/18/2015   thigh   THROMBECTOMY AND REVISION OF ARTERIOVENTOUS (AV) GORETEX  GRAFT Left 08/23/2015   Procedure: THROMBECTOMY AND REVISION OF ARTERIOVENTOUS (AV) GORETEX  GRAFT LEFT THIGH ;  Surgeon: [name], MD;  Location: [place];  Service: Vascular;  Laterality: Left;   THROMBECTOMY W/ EMBOLECTOMY Left 08/18/2015   Procedure: THROMBECTOMY  AND REVISION ARTERIOVENOUS GORE-TEX GRAFT/LEFT THIGH;  Surgeon: [name], MD;  Location: [place];  Service: Vascular;  Laterality: Left;   THROMBECTOMY W/ EMBOLECTOMY Right 06/19/2018   Procedure: THROMBECTOMY AND REVISION OF RIGHT THIGH  ARTERIOVENOUS GORE-TEX  GRAFT;  Surgeon: [name], MD;  Location: [facility] OR;  Service: Vascular;  Laterality: Right;   UPPER EXTREMITY VENOGRAPHY Bilateral 12/27/2016   Procedure: Upper Extremity Venography;  Surgeon: [name], MD;  Location: [place] CV LAB;  Service: Cardiovascular;  Laterality: Bilateral;   VENOGRAM Left 05/05/2016   Procedure: Venogram;  Surgeon: Conrad Wellton, MD;  Location: [place] CV LAB;  Service: Cardiovascular;  Laterality: Left;  lower extremity    Allergies: Ivp dye [iodinated diagnostic agents], Lisinopril, and Solu-medrol [methylprednisolone]  Medications: Prior to Admission medications   Medication Sig Start Date End Date Taking? Authorizing Provider  acetaminophen (TYLENOL) 500 MG tablet Take 1,000 mg by mouth daily as needed (for pain or headaches).    Yes [provider]  amLODipine (NORVASC) 10 MG tablet Take 10 mg by mouth at bedtime.   Yes [provider]  B Complex-C-Folic Acid (DIALYVITE TABLET) TABS Take 1 tablet by mouth daily. 06/29/20  Yes [provider]  cloNIDine (CATAPRES) 0.2 MG tablet Take 0.5 tablets (0.1 mg total) by mouth daily. 07/11/21  Yes Elodia Florence., MD  diphenhydrAMINE (BENADRYL) 50 MG tablet Take 50 mg by mouth once. Took at 10;00 07/15/21 07/15/21 Yes [provider]  predniSONE (DELTASONE) 50 MG tablet Take 50 mg by mouth. 10/12 dose at 2300, 10/13 _0  and at 10;00 07/14/21 07/15/21 Yes [provider]  albuterol (VENTOLIN HFA) 108 (90 Base) MCG/ACT inhaler Inhale 1-2 puffs into the lungs every 6 (six) hours as needed for wheezing. 02/09/21   Larene Pickett, PA-C  lidocaine-prilocaine (EMLA) cream Apply 1 application topically daily as needed (port access).  02/03/20   [provider]  TUMS ULTRA 1000 1000 MG chewable tablet Chew 4,000 mg by mouth 3 (three) times daily with meals. 03/14/20   [provider]  fluticasone (FLONASE) 50 MCG/ACT nasal spray Place 2 sprays  into both nostrils daily. Patient not taking: Reported on 06/15/2018 07/25/17 02/20/20  Larene Pickett, PA-C     Family History  Problem Relation Age of Onset   Renal Disease Neg Hx     Social History   Socioeconomic History   Marital status: Married    Spouse name: Not on file   Number of children: Not on file   Years of education: Not on file   Highest education level: Not on file  Occupational History   Not on file  Tobacco Use   Smoking status: Former    Types: Cigarettes    Quit date: 03/21/1986    Years since quitting: 35.3   Smokeless tobacco: Never  Substance and Sexual Activity   Alcohol use: No    Alcohol/week: 0.0 standard drinks   Drug use: No   Sexual activity: Not on file  Other Topics Concern   Not on file  Social History Narrative   Not on file   Social Determinants of Health   Financial Resource Strain: Not on file  Food Insecurity: Not on file  Transportation Needs: Not on file  Physical Activity: Not on file  Stress: Not on file  Social Connections: Not on file     Review of Systems: A 12 point ROS discussed and pertinent positives are indicated in the HPI above.  All other systems are negative.  Vital Signs: BP (!) 125/97   Pulse 91   Temp (!) 97.5 F (36.4 C) (Oral)   Resp 15   Ht 6' (1.829 m)   Wt 159 lb 13.3 oz (72.5 kg)   SpO2 100%   BMI 21.68 kg/m   Physical Exam Vitals and nursing note reviewed.  Constitutional:      General: Patient is not in acute distress.    Appearance: Normal appearance. Patient is not ill-appearing.  HENT:     Head: Normocephalic and atraumatic.     Mouth/Throat:     Mouth: Mucous membranes are moist.     Pharynx: Oropharynx is clear.  Cardiovascular:     Rate and Rhythm: Normal rate and regular rhythm.     Pulses: Normal pulses.     Heart sounds: Normal heart sounds.    Positive for right IJ TDC.  Pulmonary:     Effort: Pulmonary effort is normal.     Breath sounds: Normal breath sounds.   Abdominal:     General: Abdomen is flat. Bowel sounds are normal.     Palpations: Abdomen is soft.  Musculoskeletal:     Cervical back: Neck supple.  Skin:    General: Skin is warm and dry.     Coloration: Skin is not jaundiced or pale.  Neurological:     Mental Status: Patient is alert and oriented to person, place, and time.  Psychiatric:  Mood and Affect: Mood normal.        Behavior: Behavior normal.        Judgment: Judgment normal.    MD Evaluation Airway: WNL Heart: WNL Abdomen: WNL Chest/ Lungs: WNL ASA  Classification: 3 Mallampati/Airway Score: Two  Imaging: IR Fluoro Guide CV Line Right  Result Date: 07/10/2021 INDICATION: 50 year old male with a history of renal failure, thrombosed right femoral loop graft, contrast reaction history with need for premedication, presents for tunneled hemodialysis catheter EXAM: IMAGE GUIDED PLACEMENT TUNNELED HEMODIALYSIS CATHETER MEDICATIONS: 2 g Ancef; The antibiotic was administered within an appropriate time interval prior to skin puncture. ANESTHESIA/SEDATION: Moderate (conscious) sedation was employed during this procedure. A total of Versed 1.0 mg and Fentanyl 50 mcg was administered intravenously. Moderate Sedation Time: 25 minutes. The patient's level of consciousness and vital signs were monitored continuously by radiology nursing throughout the procedure under my direct supervision. FLUOROSCOPY TIME:  Fluoroscopy Time: 0 minutes 36 seconds (1 mGy). COMPLICATIONS: None immediate. PROCEDURE: Informed written consent was obtained from the patient after a discussion of the risks, benefits, and alternatives to treatment. Questions regarding the procedure were encouraged and answered. The right neck and chest were prepped with chlorhexidine in a sterile fashion, and a sterile drape was applied covering the operative field. Maximum barrier sterile technique with sterile gowns and gloves were used for the procedure. A timeout was  performed prior to the initiation of the procedure. Ultrasound survey was performed. Micropuncture kit was utilized to access the right internal jugular vein under direct, real-time ultrasound guidance after the overlying soft tissues were anesthetized with 1% lidocaine with epinephrine. Stab incision was made with 11 blade scalpel. Microwire was passed centrally. Note that during manipulation of the wire, a significant stenosis of the superior vena cava was encountered. Prior CT 02/15/2021 demonstrates a partially calcified fibrin sheath remnant/chronic calcified venous thrombus at this site. The microwire was then marked to measure appropriate internal catheter length. External tunneled length was estimated. A total tip to cuff length of 23 cm was selected. 035 guidewire was advanced to the level of the IVC. Skin and subcutaneous tissues of chest wall below the clavicle were generously infiltrated with 1% lidocaine for local anesthesia. A small stab incision was made with 11 blade scalpel. The selected hemodialysis catheter was tunneled in a retrograde fashion from the anterior chest wall to the venotomy incision. Serial dilation was performed and then a peel-away sheath was placed. The catheter was then placed through the peel-away sheath with tips ultimately positioned within the superior aspect of the right atrium. Final catheter positioning was confirmed and documented with a spot radiographic image. The catheter aspirates and flushes normally. The catheter was flushed with appropriate volume heparin dwells. The catheter exit site was secured with a 0-Prolene retention suture. Gel-Foam slurry was infused into the soft tissue tract. The venotomy incision was closed Derma bond and sterile dressing. Dressings were applied at the chest wall. Patient tolerated the procedure well and remained hemodynamically stable throughout. No complications were encountered and no significant blood loss encountered. IMPRESSION:  Status post right IJ tunneled hemodialysis catheter. Significant stenosis of the superior vena cava, secondary to partially calcified fibrin sheath/chronic changes identified on prior CT. Signed, Dulcy Fanny. Dellia Nims, RPVI [facility] Radiology PLAN: The patient will return for attempt at thrombectomy of the right femoral loop graft, with premedication for his documented contrast reaction history. At the time of a future catheter removal, would advise consideration of  IVUS evaluation and/or empiric balloon angioplasty of what will be a significantly stenotic SVC, given the presence of the partially calcified fibrin sheath on prior CT. Electronically Signed   By: [name] D.O.   On: 07/10/2021 12:39   IR US Guide Vasc Access Right  Result Date: 07/10/2021 INDICATION: 50 year old male with a history of renal failure, thrombosed right femoral loop graft, contrast reaction history with need for premedication, presents for tunneled hemodialysis catheter EXAM: IMAGE GUIDED PLACEMENT TUNNELED HEMODIALYSIS CATHETER MEDICATIONS: 2 g Ancef; The antibiotic was administered within an appropriate time interval prior to skin puncture. ANESTHESIA/SEDATION: Moderate (conscious) sedation was employed during this procedure. A total of Versed 1.0 mg and Fentanyl 50 mcg was administered intravenously. Moderate Sedation Time: 25 minutes. The patient's level of consciousness and vital signs were monitored continuously by radiology nursing throughout the procedure under my direct supervision. FLUOROSCOPY TIME:  Fluoroscopy Time: 0 minutes 36 seconds (1 mGy). COMPLICATIONS: None immediate. PROCEDURE: Informed written consent was obtained from the patient after a discussion of the risks, benefits, and alternatives to treatment. Questions regarding the procedure were encouraged and answered. The right neck and chest were prepped with chlorhexidine in a sterile fashion, and a sterile  drape was applied covering the operative field. Maximum barrier sterile technique with sterile gowns and gloves were used for the procedure. A timeout was performed prior to the initiation of the procedure. Ultrasound survey was performed. Micropuncture kit was utilized to access the right internal jugular vein under direct, real-time ultrasound guidance after the overlying soft tissues were anesthetized with 1% lidocaine with epinephrine. Stab incision was made with 11 blade scalpel. Microwire was passed centrally. Note that during manipulation of the wire, a significant stenosis of the superior vena cava was encountered. Prior CT 02/15/2021 demonstrates a partially calcified fibrin sheath remnant/chronic calcified venous thrombus at this site. The microwire was then marked to measure appropriate internal catheter length. External tunneled length was estimated. A total tip to cuff length of 23 cm was selected. 035 guidewire was advanced to the level of the IVC. Skin and subcutaneous tissues of chest wall below the clavicle were generously infiltrated with 1% lidocaine for local anesthesia. A small stab incision was made with 11 blade scalpel. The selected hemodialysis catheter was tunneled in a retrograde fashion from the anterior chest wall to the venotomy incision. Serial dilation was performed and then a peel-away sheath was placed. The catheter was then placed through the peel-away sheath with tips ultimately positioned within the superior aspect of the right atrium. Final catheter positioning was confirmed and documented with a spot radiographic image. The catheter aspirates and flushes normally. The catheter was flushed with appropriate volume heparin dwells. The catheter exit site was secured with a 0-Prolene retention suture. Gel-Foam slurry was infused into the soft tissue tract. The venotomy incision was closed Derma bond and sterile dressing. Dressings were applied at the chest wall. Patient tolerated  the procedure well and remained hemodynamically stable throughout. No complications were encountered and no significant blood loss encountered. IMPRESSION: Status post right IJ tunneled hemodialysis catheter. Significant stenosis of the superior vena cava, secondary to partially calcified fibrin sheath/chronic changes identified on prior CT. Signed, Dulcy Fanny. Dellia Nims, RPVI [facility] Interventional Radiology Special[facility] Radiology PLAN: The patient will return for attempt at thrombectomy of the right femoral loop graft, with premedication for his documented contrast reaction history. At the time of a future catheter removal, would advise consideration of IVUS evaluation and/or empiric balloon angioplasty  of what will be a significantly stenotic SVC, given the presence of the partially calcified fibrin sheath on prior CT. Electronically Signed   By: [name] D.O.   On: 07/10/2021 12:39   DG CHEST PORT 1 VIEW  Result Date: 07/10/2021 CLINICAL DATA:  COVID positive today. EXAM: PORTABLE CHEST 1 VIEW COMPARISON:  May 27, 2021 FINDINGS: The heart size and mediastinal contours are stable. The aorta is tortuous. Both lungs are clear. The visualized skeletal structures are unremarkable. Degenerative joint changes of bilateral acromioclavicular joints are noted. IMPRESSION: No active disease. Electronically Signed   By: Abelardo Diesel M.D.   On: 07/10/2021 10:39    Labs:  CBC: Recent Labs    02/19/21 0840 05/27/21 0347 05/27/21 0406 07/09/21 1210 07/11/21 0555  WBC 7.6 6.7  --  8.8 4.4  HGB 10.2* 13.4 11.9* 13.0 13.5  HCT 31.7* 39.7 35.0* 39.9 40.0  PLT 381 156  --  170 171     COAGS: Recent Labs    05/27/21 0347  INR 1.0     BMP: Recent Labs    05/27/21 0455 05/27/21 0720 07/09/21 1210 07/11/21 0555  NA 135 135 132* 133*  K 6.8* >7.5* 4.3 4.2  CL 93* 94* 90* 93*  CO2 22 21* 23 25  GLUCOSE 151* 71 85 88  BUN 84* 82* 85* 59*  CALCIUM 8.1* 8.2* 8.8* 8.2*   CREATININE 17.64* 17.84* 16.90* 13.97*  GFRNONAA 3* 3* 3* 4*     LIVER FUNCTION TESTS: Recent Labs    02/14/21 2022 02/17/21 0512 02/19/21 0841 05/27/21 0347 05/27/21 0720 07/11/21 0555  BILITOT 1.4*  --   --  0.8  --  0.5  AST 15  --   --  25  --  18  ALT 16  --   --  14  --  10  ALKPHOS 94  --   --  65  --  71  PROT 6.6  --   --  8.6*  --  8.5*  ALBUMIN 3.0*   < > 2.6* 4.1 3.9 3.3*   < > = values in this interval not displayed.     TUMOR MARKERS: No results for input(s): AFPTM, CEA, CA199, CHROMGRNA in the last 8760 hours.  Assessment and Plan: 50 y.o. 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 Earleen Newport on 07/10/2021 due to non functioning AVG. Patient was hospitalized overnight for urgent dialysis and was discharged on 07/11/2021 with outpatient referral to IR for right thigh AVG shuntogram and possible intervention.  Patient presents to [facility] IR today for the procedure. N.p.o. since midnight VSS Recent CBC and CMP on 07/11/2021 all at baseline Not on AC/AP treatment  Patient is allergic to contrast media, causes hives and neck swelling. Patient states that he took the contrast allergy premedications as directed.  Risks and benefits discussed with the patient including, but not limited to bleeding, infection, vascular injury, pulmonary embolism, need for tunneled HD catheter placement or even death.  All of the patient's questions were answered, patient is agreeable to proceed. Consent signed and in chart.    Thank you for this interesting consult.  I greatly enjoyed meeting [name] and look forward to participating in their care.  A copy of this report was sent to the requesting provider on this date.  Electronically Signed: Tera Mater, PA-C 07/15/2021, 10:56 AM   I spent a total of  25 Minutes in face to face in clinical consultation, greater than 50% of which was counseling/coordinating care for  right thigh AVG shuntogram with possible intervention and possible TDC removal.  This chart was dictated using voice recognition software.  Despite best efforts to proofread,  errors can occur which can change the documentation meaning.

## 2021-07-15 NOTE — Procedures (Signed)
  Procedure: R femoral HD graft declot, venous PTA   EBL:   minimal Complications:  none immediate  See full dictation in [employer].  Dillard Cannon MD Main # [phone] Pager  [phone]

## 2021-07-19 ENCOUNTER — Other Ambulatory Visit (HOSPITAL_COMMUNITY): Payer: Self-pay | Admitting: Nephrology

## 2021-07-19 ENCOUNTER — Encounter (HOSPITAL_COMMUNITY): Payer: Self-pay

## 2021-07-19 DIAGNOSIS — N186 End stage renal disease: Secondary | ICD-10-CM

## 2021-07-19 HISTORY — PX: IR US GUIDE VASC ACCESS RIGHT: IMG2390

## 2021-07-20 ENCOUNTER — Other Ambulatory Visit (HOSPITAL_COMMUNITY): Payer: Self-pay | Admitting: Nephrology

## 2021-07-20 DIAGNOSIS — N186 End stage renal disease: Secondary | ICD-10-CM

## 2021-07-22 ENCOUNTER — Ambulatory Visit (HOSPITAL_COMMUNITY)
Admission: RE | Admit: 2021-07-22 | Discharge: 2021-07-22 | Disposition: A | Discharge status: Home / Self Care | Payer: Self-pay | Source: Ambulatory Visit | Attending: Nephrology | Admitting: Nephrology

## 2021-07-22 ENCOUNTER — Other Ambulatory Visit: Payer: Self-pay

## 2021-07-22 DIAGNOSIS — N186 End stage renal disease: Secondary | ICD-10-CM | POA: Insufficient documentation

## 2021-07-22 DIAGNOSIS — Z4901 Encounter for fitting and adjustment of extracorporeal dialysis catheter: Secondary | ICD-10-CM | POA: Insufficient documentation

## 2021-07-22 HISTORY — PX: IR REMOVAL TUN CV CATH W/O FL: IMG2289

## 2021-07-22 MED ORDER — LIDOCAINE HCL 1 % IJ SOLN
INTRAMUSCULAR | Status: AC
  Filled 2021-07-22: qty 20

## 2021-07-22 NOTE — Procedures (Signed)
Pre procedural Dx: ESRD Post procedural Dx: Same  Successful removal of tunneled right IJ HD catheter  EBL < 1 mL No immediate complications.  Please see imaging section of Epic for full dictation.  Joaquim Nam PA-C 07/22/2021 3:08 PM

## 2021-11-10 ENCOUNTER — Other Ambulatory Visit: Payer: Self-pay

## 2021-11-10 ENCOUNTER — Encounter (HOSPITAL_COMMUNITY): Payer: Self-pay

## 2021-11-10 ENCOUNTER — Emergency Department (HOSPITAL_COMMUNITY)
Admission: EM | Admit: 2021-11-10 | Discharge: 2021-11-10 | Disposition: A | Discharge status: Home / Self Care | Payer: Self-pay | Attending: Emergency Medicine | Admitting: Emergency Medicine

## 2021-11-10 DIAGNOSIS — Y742 Prosthetic and other implants, materials and accessory general hospital and personal-use devices associated with adverse incidents: Secondary | ICD-10-CM | POA: Insufficient documentation

## 2021-11-10 DIAGNOSIS — I120 Hypertensive chronic kidney disease with stage 5 chronic kidney disease or end stage renal disease: Secondary | ICD-10-CM | POA: Insufficient documentation

## 2021-11-10 DIAGNOSIS — Z79899 Other long term (current) drug therapy: Secondary | ICD-10-CM | POA: Insufficient documentation

## 2021-11-10 DIAGNOSIS — N186 End stage renal disease: Secondary | ICD-10-CM | POA: Insufficient documentation

## 2021-11-10 DIAGNOSIS — Z992 Dependence on renal dialysis: Secondary | ICD-10-CM | POA: Insufficient documentation

## 2021-11-10 DIAGNOSIS — T82838A Hemorrhage of vascular prosthetic devices, implants and grafts, initial encounter: Secondary | ICD-10-CM | POA: Insufficient documentation

## 2021-11-10 NOTE — Discharge Instructions (Signed)
Your history, exam, work-up today are consistent with bleeding from your vascular access site that was able to stop with the constant pressure you were able to apply.  On our exam, there was no further bleeding and your exam was reassuring.  There is no evidence of acute infection or other vascular complication at this time and after discussion with you, we feel you are safe for discharge home.  Please follow-up with your PCP and your vascular surgery team to discuss further management.  Please take your other home medications.  Please rest and stay hydrated.  If any symptoms change or worsen acutely, please return to the nearest emergency department.

## 2021-11-10 NOTE — ED Provider Notes (Signed)
[facility] EMERGENCY DEPARTMENT Provider Note   CSN: [id number] Arrival date & time: 11/10/21  1242     History  Chief Complaint  Patient presents with   Vascular Access Problem    [name] is a 51 y.o. male.  The history is provided by the patient and medical records. No language interpreter was used.  Wound Check This is a new problem. The current episode started 3 to 5 hours ago. The problem occurs constantly. The problem has been rapidly improving. Pertinent negatives include no chest pain, no abdominal pain, no headaches and no shortness of breath. Nothing aggravates the symptoms. Relieved by: holding pressure. Treatments tried: presure applied. The treatment provided significant relief.      Home Medications Prior to Admission medications   Medication Sig Start Date End Date Taking? Authorizing Provider  acetaminophen (TYLENOL) 500 MG tablet Take 1,000 mg by mouth daily as needed (for pain or headaches).     [provider]  albuterol (VENTOLIN HFA) 108 (90 Base) MCG/ACT inhaler Inhale 1-2 puffs into the lungs every 6 (six) hours as needed for wheezing. 02/09/21   Larene Pickett, PA-C  amLODipine (NORVASC) 10 MG tablet Take 10 mg by mouth at bedtime.    [provider]  B Complex-C-Folic Acid (DIALYVITE TABLET) TABS Take 1 tablet by mouth daily. 06/29/20   [provider]  cloNIDine (CATAPRES) 0.2 MG tablet Take 0.5 tablets (0.1 mg total) by mouth daily. 07/11/21   Elodia Florence., MD  lidocaine-prilocaine (EMLA) cream Apply 1 application topically daily as needed (port access).  02/03/20   [provider]  TUMS ULTRA 1000 1000 MG chewable tablet Chew 4,000 mg by mouth 3 (three) times daily with meals. 03/14/20   [provider]  fluticasone (FLONASE) 50 MCG/ACT nasal spray Place 2 sprays into both nostrils daily. Patient not taking: Reported on 06/15/2018 07/25/17 02/20/20  Larene Pickett, PA-C       Allergies    Ivp dye [iodinated contrast media], Lisinopril, and Solu-medrol [methylprednisolone]    Review of Systems   Review of Systems  Constitutional:  Negative for chills, fatigue and fever.  HENT:  Negative for congestion.   Respiratory:  Negative for cough, chest tightness and shortness of breath.   Cardiovascular:  Negative for chest pain and palpitations.  Gastrointestinal:  Negative for abdominal pain, constipation, diarrhea and vomiting.  Genitourinary:        No urine reported  Musculoskeletal:  Negative for back pain.  Neurological:  Negative for syncope, light-headedness and headaches.  Psychiatric/Behavioral:  Negative for agitation.   All other systems reviewed and are negative.  Physical Exam Updated Vital Signs BP (!) 163/103 (BP Location: Right Arm)    Pulse 66    Temp 97.8 F (36.6 C) (Oral)    Resp 20    Ht 6' (1.829 m)    Wt 72.5 kg    SpO2 100%    BMI 21.68 kg/m  Physical Exam Vitals and nursing note reviewed.  Constitutional:      General: He is not in acute distress.    Appearance: He is well-developed. He is not ill-appearing, toxic-appearing or diaphoretic.  HENT:     Head: Normocephalic and atraumatic.     Nose: Nose normal.     Mouth/Throat:     Mouth: Mucous membranes are moist.  Eyes:     Conjunctiva/sclera: Conjunctivae normal.  Cardiovascular:     Rate and Rhythm: Normal rate and regular  rhythm.     Heart sounds: No murmur heard. Pulmonary:     Effort: Pulmonary effort is normal. No respiratory distress.     Breath sounds: Normal breath sounds. No wheezing, rhonchi or rales.  Chest:     Chest wall: No tenderness.  Abdominal:     General: Abdomen is flat.     Palpations: Abdomen is soft.     Tenderness: There is no abdominal tenderness. There is no guarding or rebound.  Musculoskeletal:        General: No swelling, tenderness or signs of injury.     Cervical back: Neck supple.       Legs:     Comments: Intact pulses distally in  the DP and PT arteries in the right leg.  No tenderness in the left thigh.  Has good pulse and thrill in the right upper leg fistula.  No ulceration seen  Skin:    General: Skin is warm and dry.     Capillary Refill: Capillary refill takes less than 2 seconds.     Findings: No erythema.  Neurological:     General: No focal deficit present.     Mental Status: He is alert.     Sensory: No sensory deficit.     Motor: No weakness.  Psychiatric:        Mood and Affect: Mood normal.    ED Results / Procedures / Treatments   Labs (all labs ordered are listed, but only abnormal results are displayed) Labs Reviewed - No data to display  EKG None  Radiology No results found.  Procedures Procedures    Medications Ordered in ED Medications - No data to display  ED Course/ Medical Decision Making/ A&P                           Medical Decision Making   [name] is a 51 y.o. male with a past medical history significant ESRD dialysis MWF, hypertension, anxiety, and depression who presents with fistula bleeding.  According to patient, he had his normal dialysis treatment earlier this morning and then after getting the access, started having uncontrolled bleeding from.  He has been holding pressure for the last 2 hours and thinks it stopped.  He denies any other complaints and has no pain.  He denies any lightheadedness, palpitations, or syncope.  No chest pain or shortness of breath.  No recent fevers, chills, back pain, or flank pain.  He denies any leg pain or leg swelling.  Otherwise feels normal.  He reports that he has had episodes of bleeding like this in the past and wanted to follow-up with his vascular team about it.  On exam today, his dressing was taken down and there was no evidence of acute bleeding.  There was no evidence of ulceration.  He had intact thrill and pulse in the right thigh fistula.  Distally there was normal sensation, strength, and pulses in the  foot.  Other side had old fistula that was nontender currently.  Abdomen nontender.  Lungs clear.  Patient otherwise well-appearing.  After dressing was taken down, patient proved stability with no evidence of continued bleeding.  Patient likely stopped the bleeding with constant pressure that he applied.  Patient reports he will follow-up with outpatient vascular surgery and access team and would like to go home now.  Patient did not have further bleeding here and we feel his arrival complaint of excess bleeding has  resolved.  Given his lack of tenderness, further bleeding, or evidence of infection, we agreed to hold on more advanced imaging for further work-up at this time.  Do not feel vascular surgeon needs to see him as his bleeding is stopped and he otherwise is feeling well and had normal treatment today.  Patient will follow-up with vascular team and understood discharge plan of care.  He understands return precautions and follow-up instructions..         Final Clinical Impression(s) / ED Diagnoses Final diagnoses:  Bleeding from dialysis shunt, initial encounter [facility])      Clinical Impression: 1. Bleeding from dialysis shunt, initial encounter [facility])     Disposition: Discharge  Condition: Good  I have discussed the results, Dx and Tx plan with the pt(& family if present). He/she/they expressed understanding and agree(s) with the plan. Discharge instructions discussed at great length. Strict return precautions discussed and pt &/or family have verbalized understanding of the instructions. No further questions at time of discharge.    New Prescriptions   No medications on file    Follow Up: your PCP and vascular surgery team     [facility] [address] [id number] mc [place]       Zalmen Wrightsman, Gwenyth Allegra, MD 11/10/21 [phone]

## 2021-11-10 NOTE — ED Triage Notes (Signed)
Pt BIB EMS due to a bleeding fistula. Pt was at dialysis and was de accessing and pt would not stop bleeding. Pt is holding pressure on arrival.Pt is axox4.

## 2021-11-13 ENCOUNTER — Emergency Department (HOSPITAL_COMMUNITY): Payer: Self-pay

## 2021-11-13 ENCOUNTER — Encounter (HOSPITAL_COMMUNITY): Payer: Self-pay | Admitting: Emergency Medicine

## 2021-11-13 ENCOUNTER — Emergency Department (HOSPITAL_COMMUNITY)
Admission: EM | Admit: 2021-11-13 | Discharge: 2021-11-13 | Disposition: A | Discharge status: Home / Self Care | Payer: Self-pay | Attending: Emergency Medicine | Admitting: Emergency Medicine

## 2021-11-13 ENCOUNTER — Other Ambulatory Visit: Payer: Self-pay

## 2021-11-13 DIAGNOSIS — I350 Nonrheumatic aortic (valve) stenosis: Secondary | ICD-10-CM | POA: Insufficient documentation

## 2021-11-13 DIAGNOSIS — R42 Dizziness and giddiness: Secondary | ICD-10-CM | POA: Insufficient documentation

## 2021-11-13 DIAGNOSIS — R102 Pelvic and perineal pain: Secondary | ICD-10-CM | POA: Insufficient documentation

## 2021-11-13 DIAGNOSIS — Z20822 Contact with and (suspected) exposure to covid-19: Secondary | ICD-10-CM | POA: Insufficient documentation

## 2021-11-13 DIAGNOSIS — R079 Chest pain, unspecified: Secondary | ICD-10-CM | POA: Insufficient documentation

## 2021-11-13 DIAGNOSIS — H3509 Other intraretinal microvascular abnormalities: Secondary | ICD-10-CM | POA: Insufficient documentation

## 2021-11-13 DIAGNOSIS — R002 Palpitations: Secondary | ICD-10-CM | POA: Insufficient documentation

## 2021-11-13 LAB — CBC
HCT: 41.9 % (ref 39.0–52.0)
Hemoglobin: 13.6 g/dL (ref 13.0–17.0)
MCH: 31.1 pg (ref 26.0–34.0)
MCHC: 32.5 g/dL (ref 30.0–36.0)
MCV: 95.9 fL (ref 80.0–100.0)
Platelets: 177 10*3/uL (ref 150–400)
RBC: 4.37 MIL/uL (ref 4.22–5.81)
RDW: 13.6 % (ref 11.5–15.5)
WBC: 4.7 10*3/uL (ref 4.0–10.5)
nRBC: 0 % (ref 0.0–0.2)

## 2021-11-13 LAB — RESP PANEL BY RT-PCR (FLU A&B, COVID) ARPGX2
Influenza A by PCR: NEGATIVE
Influenza B by PCR: NEGATIVE
SARS Coronavirus 2 by RT PCR: NEGATIVE

## 2021-11-13 LAB — BASIC METABOLIC PANEL
Anion gap: 15 (ref 5–15)
BUN: 43 mg/dL — ABNORMAL HIGH (ref 6–20)
CO2: 28 mmol/L (ref 22–32)
Calcium: 8.2 mg/dL — ABNORMAL LOW (ref 8.9–10.3)
Chloride: 94 mmol/L — ABNORMAL LOW (ref 98–111)
Creatinine, Ser: 10.34 mg/dL — ABNORMAL HIGH (ref 0.61–1.24)
GFR, Estimated: 6 mL/min — ABNORMAL LOW (ref >60)
Glucose, Bld: 102 mg/dL — ABNORMAL HIGH (ref 70–99)
Potassium: 4.1 mmol/L (ref 3.5–5.1)
Sodium: 137 mmol/L (ref 135–145)

## 2021-11-13 LAB — TROPONIN I (HIGH SENSITIVITY): Troponin I (High Sensitivity): 19 ng/L — ABNORMAL HIGH (ref <18)

## 2021-11-13 NOTE — ED Provider Notes (Addendum)
[facility] EMERGENCY DEPARTMENT Provider Note   CSN: [id number] Arrival date & time: 11/13/21  1135     History  Chief Complaint  Patient presents with   Chest Pain   Dizziness    [name] is a 51 y.o. male.  HPI 51 year old male presents with a chief complaint of dizziness and palpitations as well as perineal pain.  He states both of these have been on and off for about a week.  They do not always come together.  The pain in his perineum is not related to his scrotum and feels like an on and off pain that comes and goes without clear etiology.  He has not seen swelling and he does not urinate.  No rectal pain.  He also endorses some dizziness that seems to come and go.  The dizziness feels like a lightheadedness but also being off balance.  Kind of feels like he is drunk.  There is no headache, vision changes.  He will get nauseous when it happens.  It seems to go and come lasting seconds at a time and nothing specific induces it.  Last occurred with dialysis.  He states the perineal pain has not occurred since yesterday.  Right now he feels fine.  No chest pain or shortness of breath.  No vomiting or abdominal pain.  Home Medications Prior to Admission medications   Medication Sig Start Date End Date Taking? Authorizing Provider  acetaminophen (TYLENOL) 500 MG tablet Take 1,000 mg by mouth daily as needed (for pain or headaches).     [provider]  albuterol (VENTOLIN HFA) 108 (90 Base) MCG/ACT inhaler Inhale 1-2 puffs into the lungs every 6 (six) hours as needed for wheezing. 02/09/21   Larene Pickett, PA-C  amLODipine (NORVASC) 10 MG tablet Take 10 mg by mouth at bedtime.    [provider]  B Complex-C-Folic Acid (DIALYVITE TABLET) TABS Take 1 tablet by mouth daily. 06/29/20   [provider]  cloNIDine (CATAPRES) 0.2 MG tablet Take 0.5 tablets (0.1 mg total) by mouth daily. 07/11/21   Elodia Florence., MD   lidocaine-prilocaine (EMLA) cream Apply 1 application topically daily as needed (port access).  02/03/20   [provider]  TUMS ULTRA 1000 1000 MG chewable tablet Chew 4,000 mg by mouth 3 (three) times daily with meals. 03/14/20   [provider]  fluticasone (FLONASE) 50 MCG/ACT nasal spray Place 2 sprays into both nostrils daily. Patient not taking: Reported on 06/15/2018 07/25/17 02/20/20  Larene Pickett, PA-C      Allergies    Ivp dye [iodinated contrast media], Lisinopril, and Solu-medrol [methylprednisolone]    Review of Systems   Review of Systems  Constitutional:  Negative for fever.  Eyes:  Negative for visual disturbance.  Respiratory:  Negative for shortness of breath.   Cardiovascular:  Negative for chest pain.  Gastrointestinal:  Negative for abdominal pain.  Musculoskeletal:  Negative for neck pain.  Neurological:  Positive for dizziness and light-headedness. Negative for syncope, weakness, numbness and headaches.   Physical Exam Updated Vital Signs BP (!) 144/89 (BP Location: Right Arm)    Pulse (!) 56    Temp 97.7 F (36.5 C) (Oral)    Resp (!) 22    SpO2 100%  Physical Exam Vitals and nursing note reviewed.  Constitutional:      Appearance: He is well-developed.  HENT:     Head: Normocephalic and atraumatic.  Eyes:  Extraocular Movements: Extraocular movements intact.     Pupils: Pupils are equal, round, and reactive to light.  Cardiovascular:     Rate and Rhythm: Normal rate and regular rhythm.     Pulses:          Dorsalis pedis pulses are 2+ on the right side and 2+ on the left side.     Heart sounds: Normal heart sounds.  Pulmonary:     Effort: Pulmonary effort is normal.     Breath sounds: Normal breath sounds.  Abdominal:     Palpations: Abdomen is soft.     Tenderness: There is no abdominal tenderness.  Genitourinary:   Musculoskeletal:     Cervical back: Normal range of motion and neck supple.  Skin:    General: Skin is  warm and dry.  Neurological:     Mental Status: He is alert.     Comments: CN 3-12 grossly intact. 5/5 strength in all 4 extremities. Grossly normal sensation. Normal finger to nose. Normal gait    ED Results / Procedures / Treatments   Labs (all labs ordered are listed, but only abnormal results are displayed) Labs Reviewed  BASIC METABOLIC PANEL - Abnormal; Notable for the following components:      Result Value   Chloride 94 (*)    Glucose, Bld 102 (*)    BUN 43 (*)    Creatinine, Ser 10.34 (*)    Calcium 8.2 (*)    GFR, Estimated 6 (*)    All other components within normal limits  TROPONIN I (HIGH SENSITIVITY) - Abnormal; Notable for the following components:   Troponin I (High Sensitivity) 19 (*)    All other components within normal limits  RESP PANEL BY RT-PCR (FLU A&B, COVID) ARPGX2  CBC    EKG EKG Interpretation  Date/Time:  Saturday November 13 2021 11:51:01 EST Ventricular Rate:  74 PR Interval:  172 QRS Duration: 88 QT Interval:  424 QTC Calculation: 470 R Axis:   41 Text Interpretation: Normal sinus rhythm Nonspecific T wave abnormality Prolonged QT Peaked T waves no longer present when compared to Oct 2022 Confirmed by Sherwood Gambler [id number]) on 11/13/2021 12:42:01 PM  Radiology DG Chest 2 View  Result Date: 11/13/2021 CLINICAL DATA:  Chest pain.  Dizziness.  Heart racing. EXAM: CHEST - 2 VIEW COMPARISON:  07/10/2021 FINDINGS: Cardiac silhouette is normal in size and configuration. Normal mediastinal and hilar contours. Clear lungs.  No pleural effusion or pneumothorax. Skeletal structures are unremarkable. IMPRESSION: No active cardiopulmonary disease. Electronically Signed   By: Lajean Manes M.D.   On: 11/13/2021 12:30   CT Head Wo Contrast  Result Date: 11/13/2021 CLINICAL DATA:  Dizziness. EXAM: CT HEAD WITHOUT CONTRAST TECHNIQUE: Contiguous axial images were obtained from the base of the skull through the vertex without intravenous contrast. RADIATION  DOSE REDUCTION: This exam was performed according to the departmental dose-optimization program which includes automated exposure control, adjustment of the mA and/or kV according to patient size and/or use of iterative reconstruction technique. COMPARISON:  12/05/2014 FINDINGS: Brain: No evidence of acute infarction, hemorrhage, hydrocephalus, extra-axial collection or mass lesion/mass effect. Vascular: No hyperdense vessel or unexpected calcification. Skull: Normal. Negative for fracture or focal lesion. Sinuses/Orbits: Globes and orbits are unremarkable. Visualized sinuses are clear. Other: None. IMPRESSION: Normal unenhanced CT scan of the brain. Electronically Signed   By: Lajean Manes M.D.   On: 11/13/2021 14:22   CT PELVIS WO CONTRAST  Result Date: 11/13/2021 CLINICAL DATA:  Pain.  Suspected perianal/perirectal abscess. EXAM: CT PELVIS WITHOUT CONTRAST TECHNIQUE: Multidetector CT imaging of the pelvis was performed following the standard protocol without intravenous contrast. RADIATION DOSE REDUCTION: This exam was performed according to the departmental dose-optimization program which includes automated exposure control, adjustment of the mA and/or kV according to patient size and/or use of iterative reconstruction technique. COMPARISON:  None. FINDINGS: Urinary Tract: Bladder decompressed. Visualized ureters are unremarkable. Bowel: No bowel dilation. No wall thickening or inflammatory changes of the visualized bowel. Several diverticular are noted of the visualized right colon. Vascular/Lymphatic: Right common and external iliac vein stent arterial grafts extend in the subcutaneous soft tissues from the right common femoral artery, another extending from the right common femoral vein. There are also grafts that extend from the left common iliac artery and vein. Anterior to the origin of these grafts is the rounded mass measuring 3.4 cm anterior to posterior by 4.6 cm superior to inferior. This is  smaller than it was on the prior CT, consistent with a thrombosed pseudoaneurysm. No enlarged lymph nodes. Reproductive:  Unremarkable. Other: No evidence of a perianal or perirectal abscess or inflammation. No inflammatory changes of the perineum. No pelvic fluid collections. Musculoskeletal: No fracture or acute finding.  No bone lesion. IMPRESSION: 1. No evidence of a perianal or perirectal abscess or inflammation. 2. No acute findings. 3. Vascular abnormalities as detailed above. 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 This measures smaller than on the CT from 2018. Electronically Signed   By: Lajean Manes M.D.   On: 11/13/2021 14:28    Procedures Procedures    Medications Ordered in ED Medications - No data to display  ED Course/ Medical Decision Making/ A&P                           Medical Decision Making Amount and/or Complexity of Data Reviewed Labs: ordered. Radiology: ordered.   Patient's labs show chronic renal failure but otherwise are benign.  Troponin is slightly elevated but much lower than what he had in the past and he does not have any cardiac symptoms besides palpitations.  Very low suspicion for ACS.  Could be near syncope though he also has symptoms that could be vertigo.  Upon further talking to patient he indicates that sometimes this happens during dialysis but sometimes not.  I discussed with Dr. Leonel Ramsay who recommends we could get MRI brain to rule out any kind of ischemia as well as MRA head and neck to help rule out critical posterior circulation stenosis.  Patient would like to proceed with these.  From a perineum perspective, he is not tender now and the CT does not show any obvious findings.  He has a chronic thrombosed pseudoaneurysm but this is unchanged versus smaller.  Thus I am not sure what to do with this type of pain as there is no obvious abscess or infection.  I have  personally reviewed the ECG which shows no acute ischemia.  CT head has also been reviewed by myself and shows no obvious swelling, mass effect, stroke or bleed.  Care transferred to Dr. Kathrynn Humble with the MRA and MRI pending.        Final Clinical Impression(s) / ED Diagnoses Final diagnoses:  None    Rx / DC Orders ED Discharge Orders     None         Sherwood Gambler, MD 11/13/21 1615  Sherwood Gambler, MD 11/14/21 [phone]

## 2021-11-13 NOTE — ED Triage Notes (Signed)
Pt reports dizziness, heart racing, shaking all over, and pain to center of chest x 1 week.  Last dialysis yesterday.

## 2021-11-13 NOTE — Discharge Instructions (Signed)
All the results in the ER are normal, labs and imaging.  MRI shows no evidence of stroke. We are not sure what is causing your symptoms. The workup in the ER is not complete, and is limited to screening for life threatening and emergent conditions only, so please see a primary care doctor for further evaluation.

## 2021-11-13 NOTE — ED Provider Triage Note (Signed)
Emergency Medicine Provider Triage Evaluation Note  [name] , a 51 y.o. male  was evaluated in triage.  Pt complains of dizziness, body shakes, and palpitations. Describes dizziness as feeling off balance. Dialysis patient on MWF, last full session yesterday. No fever or chills. Denies CP and SOB. Also endorses some pain under his testicles. Does not produce any urine. No history of blood clots.  Review of Systems  Positive: Dizziness, testicle pain, palpitations Negative: fever  Physical Exam  BP 122/79    Pulse 71    Temp 97.7 F (36.5 C) (Oral)    Resp 18    SpO2 100%  Gen:   Awake, no distress   Resp:  Normal effort  MSK:   Moves extremities without difficulty  Other:  GU exam deferred in triage  Medical Decision Making  Medically screening exam initiated at 12:07 PM.  Appropriate orders placed.  [name] was informed that the remainder of the evaluation will be completed by another provider, this initial triage assessment does not replace that evaluation, and the importance of remaining in the ED until their evaluation is complete.  Cardiac labs COVID test   Suzy Bouchard, PA-C 11/13/21 1210

## 2021-11-13 NOTE — ED Notes (Signed)
Patient transported to MRI 

## 2021-11-13 NOTE — ED Provider Notes (Addendum)
°  Physical Exam  BP (!) 156/91    Pulse 65    Temp 97.7 F (36.5 C) (Oral)    Resp 18    SpO2 100%   Physical Exam  Procedures  Procedures  ED Course / MDM    Medical Decision Making Amount and/or Complexity of Data Reviewed Labs: ordered. Radiology: ordered.    Assuming care of patient from Dr. Verta Ellen   Patient in the ED for dizziness and pelvis pain. Workup thus far shows - neg CT head and Ct pelvis  Concerning findings are as following - no concerning findings. Important pending results are MRI brain  According to Dr. Regenia Skeeter, plan is to f/u on MRI. If neg, pt can be safely d/c.   Patient had no complains, no concerns from the nursing side. Will continue to monitor.     Varney Biles, MD 11/13/21 1612   6:04 PM Results of MRI and MRA reviewed.  No evidence of any stroke or clinically significant stenosis.  Results discussed with the patient.  The patient appears reasonably screened and/or stabilized for discharge and I doubt any other medical condition or other [facility] requiring further screening, evaluation, or treatment in the ED at this time prior to discharge.   Results from the ER workup discussed with the patient face to face and all questions answered to the best of my ability. The patient is safe for discharge with strict return precautions.    Varney Biles, MD 11/13/21 [phone]

## 2021-11-15 IMAGING — MR MR PELVIS W/O CM
4 of 8 series · 18 of 48 positions shown · IV contrast (Y)
Comparison: None.

CLINICAL DATA: 43-year-old male with a history of right lower
extremity swelling.

History of femoral loop dialysis graft and prior stenosis of the
iliac vein on prior shuntogram.
EXAM:
MRI PELVIS WITHOUT CONTRAST
TECHNIQUE: Multiplanar multisequence MR imaging of the pelvis was performed. No
intravenous contrast was administered.

[Series 2: ax ir · axial · 8.0mm · 0.78mm/px · z∈[-52,+218]mm · 3 of 28 slices shown]
[im 1/28]
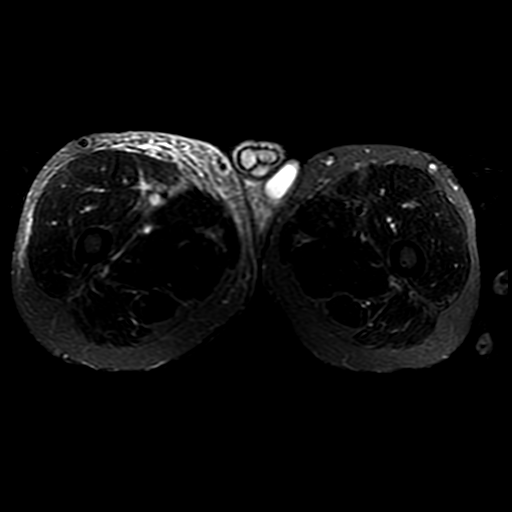
[im 14/28]
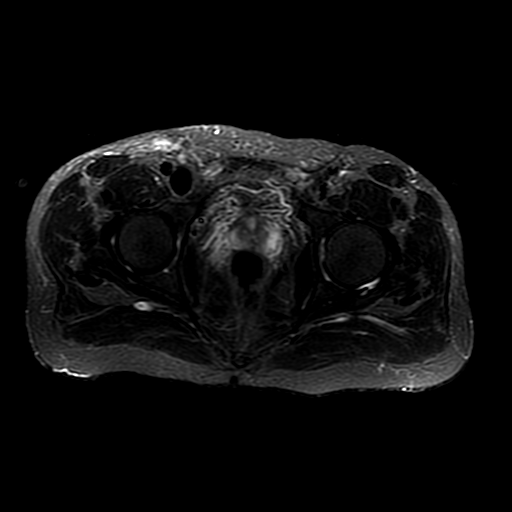
[im 28/28]
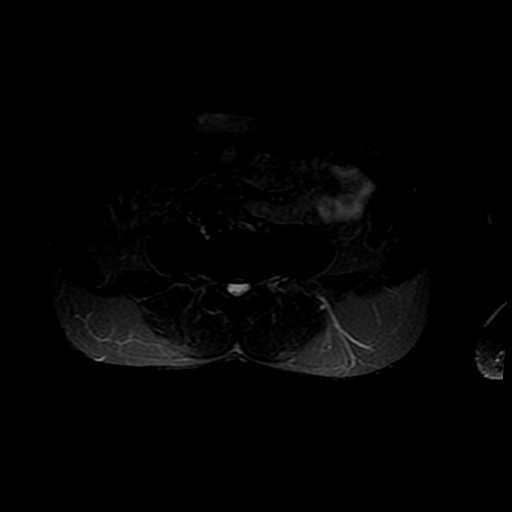

[Series 3: MRA · axial · 4.0mm · 0.78mm/px · z∈[-46,+283]mm · 8 of 99 slices shown]
[im 1/99]
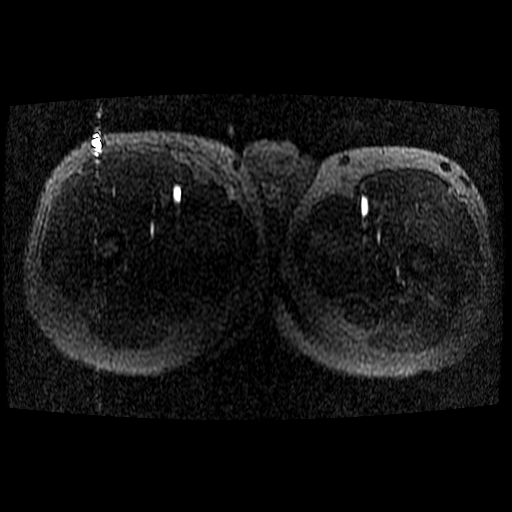
[im 16/99]
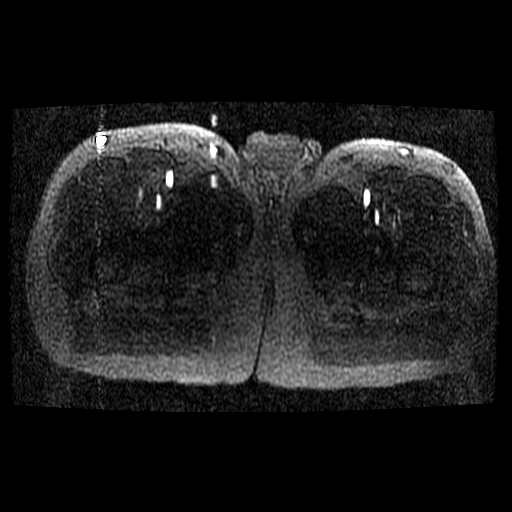
[im 31/99]
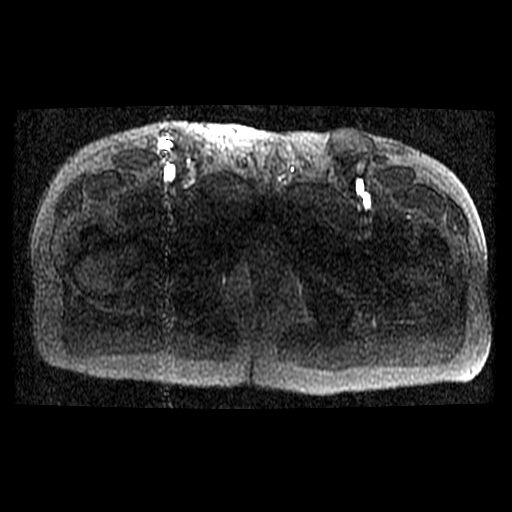
[im 46/99]
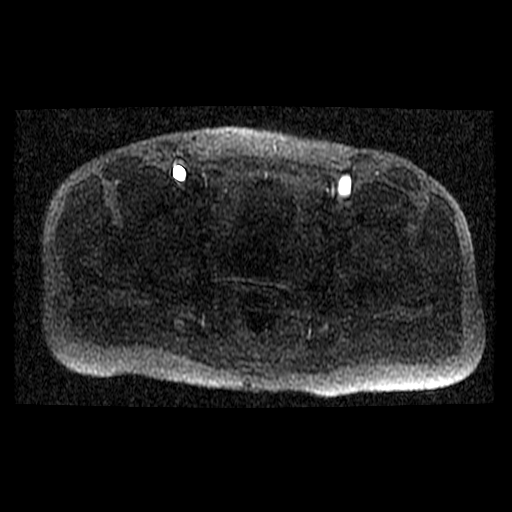
[im 53/99]
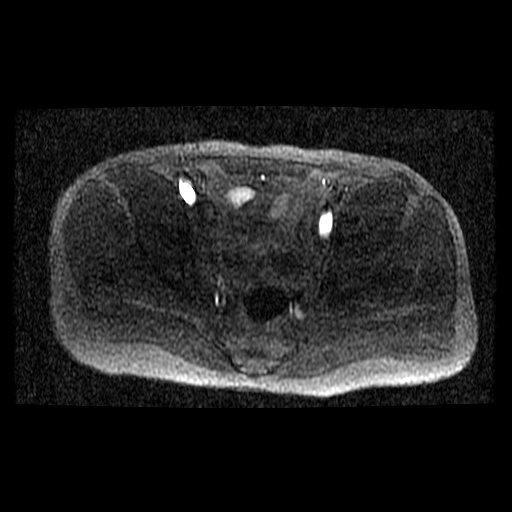
[im 68/99]
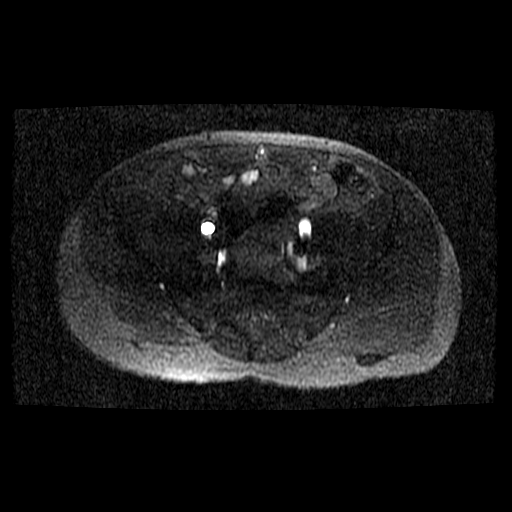
[im 83/99]
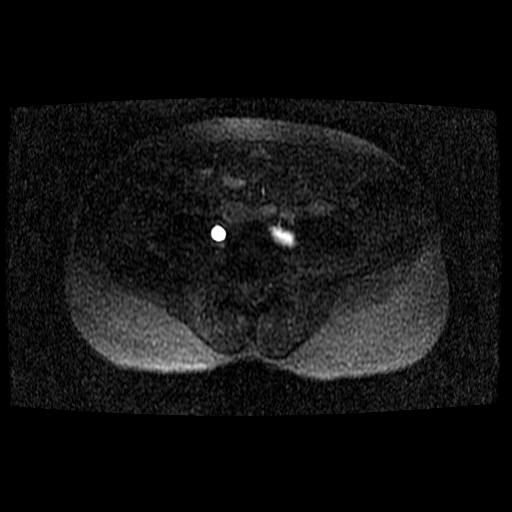
[im 99/99]
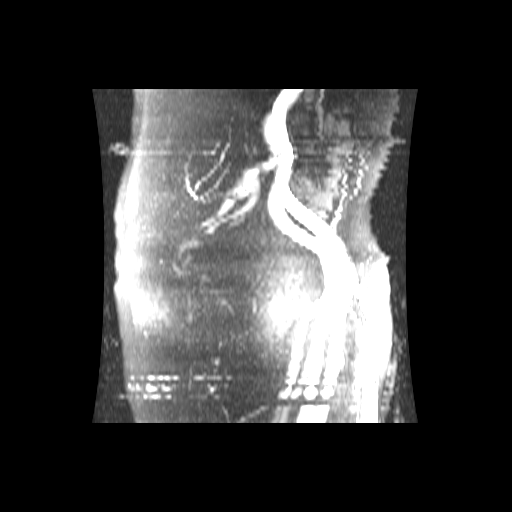

[Series 5: cor fse ir · coronal · 5.0mm · 0.78mm/px · 4 of 25 slices shown]
[im 1/25]
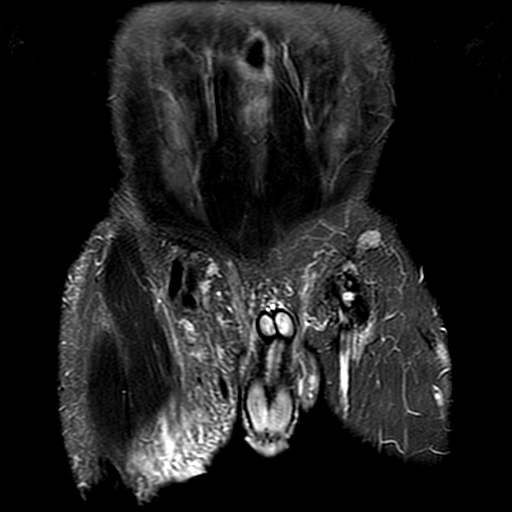
[im 9/25]
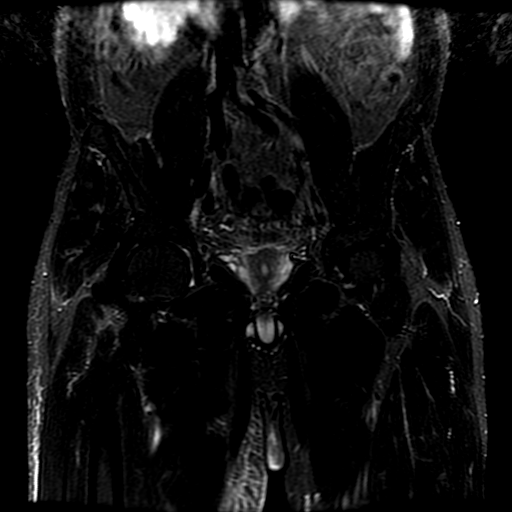
[im 17/25]
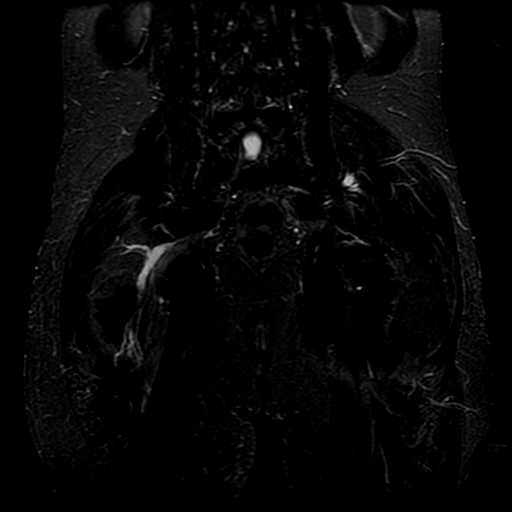
[im 25/25]
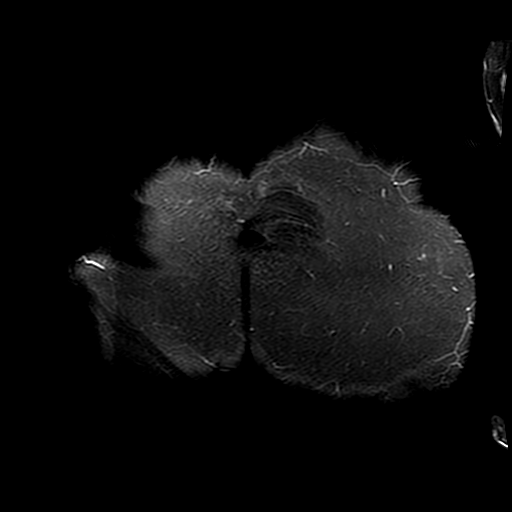

[Series 6: T2 · axial · 5.0mm · 0.72mm/px · z∈[+13,+211]mm · 3 of 42 slices shown]
[im 9/42]
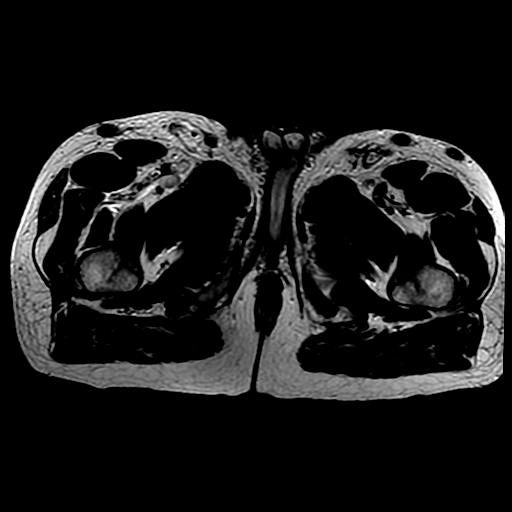
[im 25/42]
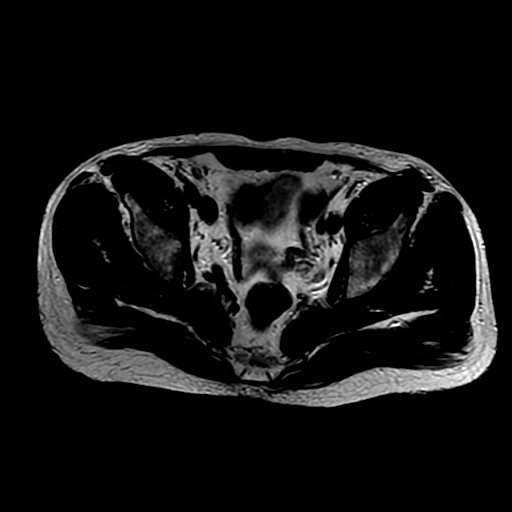
[im 42/42]
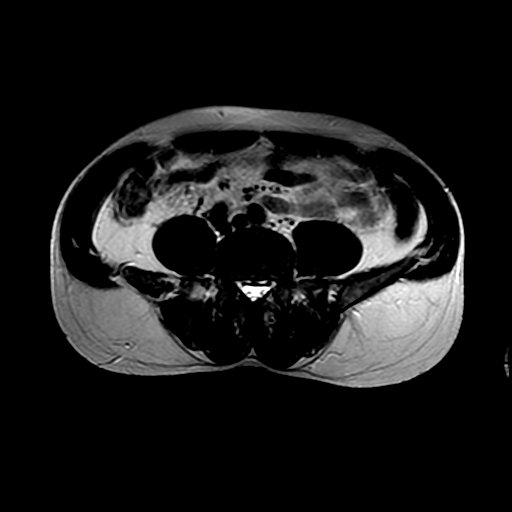

[18 of 48 positions shown; findings below may reference images not displayed]

FINDINGS: Vascular:

Arterial:

Bilateral iliac arteries remain patent with no high-grade stenosis
identified. Mild tortuosity.

The proximal femoral arteries are patent.

The MR demonstrates right femoral loop graft which is patent
proximally. The distal aspect of the loop graft is not well
evaluated.

Venous:

The infrarenal IVC is patent.

Visualized left femoral vein in the proximal thigh is patent.

Patent external iliac vein.

Patent left internal iliac venous system with patent confluence to
the common iliac vein. Common iliac vein patent without evidence of
high-grade stenosis or occlusion by the MR.

The MR is of decreased fidelity within the right iliac system,
however, there is evidence of attenuation of the common iliac vein
at the confluence to the IVC, as well as attenuation of the
peripheral external iliac vein. Prominent collateral pelvic veins on
the right.

Nonvascular:

Nonspecific edema within the soft tissues anterior thighs
bilaterally. There is focal fluid anterior to the left femoral
vasculature, persistent and postsurgical changes.

Unremarkable musculoskeletal system.

Reactive lymph nodes of the bilateral inguinal region.

Unremarkable appearance of the pelvic structures.
IMPRESSION: The MR venogram is of decreased fidelity, however, there appears to
be at least high-grade stenosis or occlusion of the right iliac vein
with pelvic draining collaterals, compatible with the findings prior
fistulagram.

Patent left-sided iliac venous system.

Edema with the anterior bilateral thighs with postsurgical changes
of the bilateral inguinal region and likely reactive adenopathy.

## 2021-11-17 ENCOUNTER — Encounter: Payer: Self-pay | Admitting: Nephrology

## 2021-11-17 DIAGNOSIS — E079 Disorder of thyroid, unspecified: Secondary | ICD-10-CM | POA: Insufficient documentation

## 2021-11-19 ENCOUNTER — Other Ambulatory Visit (HOSPITAL_COMMUNITY): Payer: Self-pay | Admitting: Nephrology

## 2021-11-19 DIAGNOSIS — R002 Palpitations: Secondary | ICD-10-CM

## 2021-11-19 DIAGNOSIS — R55 Syncope and collapse: Secondary | ICD-10-CM

## 2021-11-22 ENCOUNTER — Telehealth: Payer: Self-pay

## 2021-11-22 NOTE — Telephone Encounter (Signed)
Notes scanned to referral 

## 2021-11-23 ENCOUNTER — Encounter (HOSPITAL_COMMUNITY): Payer: Self-pay | Admitting: Nephrology

## 2021-12-20 ENCOUNTER — Other Ambulatory Visit (HOSPITAL_COMMUNITY): Payer: Self-pay | Admitting: Nephrology

## 2021-12-20 ENCOUNTER — Other Ambulatory Visit: Payer: Self-pay | Admitting: Nephrology

## 2021-12-20 ENCOUNTER — Other Ambulatory Visit: Payer: Self-pay | Admitting: Student

## 2021-12-20 DIAGNOSIS — N186 End stage renal disease: Secondary | ICD-10-CM

## 2021-12-20 DIAGNOSIS — T82590A Other mechanical complication of surgically created arteriovenous fistula, initial encounter: Secondary | ICD-10-CM

## 2021-12-20 MED ORDER — CONTRAST ALLERGY PREMED PACK 3 X 50 MG & 1 X 50 MG PO KIT: 1.0000 | PACK | Freq: Once | ORAL | 0 refills | Status: DC

## 2021-12-20 NOTE — Progress Notes (Signed)
Contrast premedication called into patient's preferred pharmacy.  ? ?Brynda Greathouse, MS RD PA-C ? ? ?

## 2021-12-22 ENCOUNTER — Other Ambulatory Visit: Payer: Self-pay | Admitting: Radiology

## 2021-12-22 ENCOUNTER — Other Ambulatory Visit: Payer: Self-pay | Admitting: Internal Medicine

## 2021-12-22 ENCOUNTER — Telehealth (HOSPITAL_COMMUNITY): Payer: Self-pay

## 2021-12-22 NOTE — Telephone Encounter (Signed)
Returned call, no answer, vm full. AW ?

## 2021-12-22 NOTE — H&P (Incomplete)
? ?Chief Complaint: ?Patient was seen in consultation today for fistulogram/shuntogram with possible intervention to include possible tunneled catheter placement.  ? at the request of [name] ? ?Referring Physician(s): ?[name] ? ?Supervising Physician: [name] ? ?Patient Status: [name] - Out-pt ? ?History of Present Illness: ?[name] is a 51 y.o. male w/ PMH significant for ESRD on HD and HTN. Pt was referred to IR by Dr. [name] for evaluation of right thigh AV graft d/t bleeding around needle during access and prolonged bleeding of graft post HD with areas of weakened vessel tissue worrisome for rupture.  ? ?Past Medical History:  ?Diagnosis Date  ? Anemia   ? Anxiety   ? Depression   ? ESRD on hemodialysis ([place])   ? HD [place] MWF  ? Hypertension   ? Thyroid disease   ? ? ?Past Surgical History:  ?Procedure Laterality Date  ? ARTERIOVENOUS GRAFT PLACEMENT Left   ? "forearm, it's not working; thigh"   ? ARTERIOVENOUS GRAFT PLACEMENT Left 11/09/2015  ? AV FISTULA PLACEMENT Right 01/10/2017  ? Procedure: INSERTION OF ARTERIOVENOUS Right thigh GORE-TEX GRAFT;  Surgeon: [name], MD;  Location: [facility] OR;  Service: Vascular;  Laterality: Right;  ? FALSE ANEURYSM REPAIR Left 11/09/2015  ? Procedure: REPAIR OF LEFT FEMORAL PSEUDOANEURYSM; REVISION  OF LEFT THIGH ARTERIOVENOUS GRAFT USING 6MM X 10 CM GORETEX GRAFT ;  Surgeon: [name], MD;  Location: [facility] OR;  Service: Vascular;  Laterality: Left;  ? IR AV DIALY SHUNT INTRO NEEDLE/INTRACATH INITIAL W/PTA/IMG RIGHT Right 02/06/2019  ? IR AV DIALY SHUNT INTRO NEEDLE/INTRACATH INITIAL W/PTA/IMG RIGHT Right 05/14/2019  ? IR AV DIALY SHUNT INTRO NEEDLE/INTRACATH INITIAL W/PTA/IMG RIGHT Right 08/18/2020  ? IR DIALY SHUNT INTRO NEEDLE/INTRACATH INITIAL W/IMG RIGHT Right 09/17/2019  ? IR DIALY SHUNT INTRO NEEDLE/INTRACATH INITIAL W/IMG RIGHT Right 12/10/2019  ? IR FLUORO GUIDE CV LINE RIGHT  07/10/2021  ? IR GENERIC  HISTORICAL  07/15/2016  ? IR US GUIDE VASC ACCESS LEFT 07/15/2016 [name], MD [facility]  ? IR GENERIC HISTORICAL Left 07/15/2016  ? IR THROMBECTOMY AV FISTULA W/THROMBOLYSIS/PTA INC/SHUNT/IMG LEFT 07/15/2016 [name], MD [facility]  ? IR GENERIC HISTORICAL  10/05/2016  ? IR US GUIDE VASC ACCESS LEFT 10/05/2016 [name], MD [facility]  ? IR GENERIC HISTORICAL Left 10/05/2016  ? IR THROMBECTOMY AV FISTULA W/THROMBOLYSIS/PTA INC/SHUNT/IMG LEFT 10/05/2016 [name], MD [facility]  ? IR GENERIC HISTORICAL  10/08/2016  ? IR FLUORO GUIDE CV LINE RIGHT 10/08/2016 [name], MD [facility]  ? IR GENERIC HISTORICAL  10/08/2016  ? IR US GUIDE VASC ACCESS RIGHT 10/08/2016 [name], MD [facility]  ? IR PTA AND STENT ADDL CENTRAL DIALY SEG THRU DIALY CIRCUIT RIGHT Right 12/10/2019  ? IR RADIOLOGIST EVAL & MGMT  11/26/2019  ? IR REMOVAL TUN CV CATH W/O FL  03/16/2017  ? IR REMOVAL TUN CV CATH W/O FL  07/22/2021  ? IR THROMBECTOMY AV FISTULA W/THROMBOLYSIS/PTA INC/SHUNT/IMG RIGHT Right 06/16/2018  ? IR THROMBECTOMY AV FISTULA W/THROMBOLYSIS/PTA INC/SHUNT/IMG RIGHT Right 06/17/2018  ? IR THROMBECTOMY AV FISTULA W/THROMBOLYSIS/PTA INC/SHUNT/IMG RIGHT Right 07/15/2021  ? IR US GUIDE VASC ACCESS RIGHT  06/17/2018  ? IR US GUIDE VASC ACCESS RIGHT  06/16/2018  ? IR US GUIDE VASC ACCESS RIGHT  02/06/2019  ? IR US GUIDE VASC ACCESS RIGHT  05/14/2019  ? IR US GUIDE VASC ACCESS RIGHT  09/17/2019  ? IR US GUIDE VASC ACCESS RIGHT  12/10/2019  ?  IR US GUIDE VASC ACCESS RIGHT  08/18/2020  ? IR US GUIDE VASC ACCESS RIGHT  07/10/2021  ? IR US GUIDE VASC ACCESS RIGHT  07/19/2021  ? PARATHYROIDECTOMY N/A 04/24/2014  ? Procedure: TOTAL 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;  Surgeon: [name], MD;  Location: [place];  Service: General;  Laterality: N/A;  NECK AND LEFT FOREARM  ? PERIPHERAL VASCULAR CATHETERIZATION N/A 05/05/2016  ? Procedure: A/V Shuntogram;  Surgeon: [name], MD;  Location: [place] CV LAB;  Service:  Cardiovascular;  Laterality: N/A;  ? PERIPHERAL VASCULAR CATHETERIZATION Left 05/05/2016  ? Procedure: Peripheral Vascular Balloon Angioplasty;  Surgeon: [name], MD;  Location: [place] CV LAB;  Service: Cardiovascular;  Laterality: Left;  ? PSEUDOANEURYSM REPAIR Left 11/09/2015  ? TEE WITHOUT CARDIOVERSION N/A 02/19/2021  ? Procedure: TRANSESOPHAGEAL ECHOCARDIOGRAM (TEE);  Surgeon: [name], MD;  Location: [place];  Service: Cardiovascular;  Laterality: N/A;  ? THROMBECTOMY / ARTERIOVENOUS GRAFT REVISION Left 08/18/2015  ? thigh  ? THROMBECTOMY AND REVISION OF ARTERIOVENTOUS (AV) GORETEX  GRAFT Left 08/23/2015  ? Procedure: THROMBECTOMY AND REVISION OF ARTERIOVENTOUS (AV) GORETEX  GRAFT LEFT THIGH ;  Surgeon: [name], MD;  Location: [place];  Service: Vascular;  Laterality: Left;  ? THROMBECTOMY W/ EMBOLECTOMY Left 08/18/2015  ? Procedure: THROMBECTOMY  AND REVISION ARTERIOVENOUS GORE-TEX GRAFT/LEFT THIGH;  Surgeon: [name], MD;  Location: [place];  Service: Vascular;  Laterality: Left;  ? THROMBECTOMY W/ EMBOLECTOMY Right 06/19/2018  ? Procedure: THROMBECTOMY AND REVISION OF RIGHT THIGH  ARTERIOVENOUS GORE-TEX GRAFT;  Surgeon: [name], MD;  Location: [place];  Service: Vascular;  Laterality: Right;  ? UPPER EXTREMITY VENOGRAPHY Bilateral 12/27/2016  ? Procedure: Upper Extremity Venography;  Surgeon: [name], MD;  Location: [place] CV LAB;  Service: Cardiovascular;  Laterality: Bilateral;  ? VENOGRAM Left 05/05/2016  ? Procedure: Venogram;  Surgeon: Conrad New Weston, MD;  Location: [place] CV LAB;  Service: Cardiovascular;  Laterality: Left;  lower extremity  ? ? ?Allergies: ?Ivp dye [iodinated contrast media], Lisinopril, and Solu-medrol [methylprednisolone] ? ?Medications: ?Prior to Admission medications   ?Medication Sig Start Date End Date Taking? Authorizing Provider  ?acetaminophen (TYLENOL) 500 MG tablet Take 1,000 mg by mouth daily as needed (for  pain or headaches).    Yes [provider]  ?amLODipine (NORVASC) 10 MG tablet Take 10 mg by mouth at bedtime.   Yes [provider]  ?B Complex-C-Folic Acid (DIALYVITE TABLET) TABS Take 1 tablet by mouth daily. 06/29/20  Yes [provider]  ?cloNIDine (CATAPRES) 0.2 MG tablet Take 0.5 tablets (0.1 mg total) by mouth daily. ?Patient taking differently: Take 0.1-0.2 mg by mouth daily. 07/11/21  Yes Elodia Florence., MD  ?lidocaine-prilocaine (EMLA) cream Apply 1 application topically daily as needed (port access).  02/03/20  Yes [provider]  ?TUMS ULTRA 1000 1000 MG chewable tablet Chew 4,000 mg by mouth 3 (three) times daily with meals. 03/14/20  Yes [provider]  ?fluticasone (FLONASE) 50 MCG/ACT nasal spray Place 2 sprays into both nostrils daily. ?Patient not taking: Reported on 06/15/2018 07/25/17 02/20/20  Larene Pickett, PA-C  ?  ? ?Family History  ?Problem Relation Age of Onset  ? Renal Disease Neg Hx   ? ? ?Social History  ? ?Socioeconomic History  ? Marital status: Married  ?  Spouse name: Not on file  ? Number of children: Not on file  ? Years of education:  Not on file  ? Highest education level: Not on file  ?Occupational History  ? Not on file  ?Tobacco Use  ? Smoking status: Former  ?  Types: Cigarettes  ?  Quit date: 03/21/1986  ?  Years since quitting: 35.7  ? Smokeless tobacco: Never  ?Substance and Sexual Activity  ? Alcohol use: No  ?  Alcohol/week: 0.0 standard drinks  ? Drug use: No  ? Sexual activity: Not on file  ?Other Topics Concern  ? Not on file  ?Social History Narrative  ? Not on file  ? ?Social Determinants of Health  ? ?Financial Resource Strain: Not on file  ?Food Insecurity: Not on file  ?Transportation Needs: Not on file  ?Physical Activity: Not on file  ?Stress: Not on file  ?Social Connections: Not on file  ? ? ?Review of Systems: A 12 point ROS discussed and pertinent positives are indicated in the HPI above.  All other systems  are negative. ? ?Review of Systems ? ?Vital Signs: ?There were no vitals taken for this visit. ? ?Physical Exam ? ?Imaging: ?No results found. ? ?Labs: ? ?CBC: ?Recent Labs  ?  05/27/21 ?[id number] 05/27/21 ?0406 10/07

## 2021-12-23 ENCOUNTER — Ambulatory Visit (HOSPITAL_COMMUNITY): Admission: RE | Admit: 2021-12-23 | Payer: Self-pay | Source: Ambulatory Visit

## 2021-12-23 ENCOUNTER — Encounter (HOSPITAL_COMMUNITY): Payer: Self-pay

## 2022-02-04 ENCOUNTER — Telehealth: Payer: Self-pay | Admitting: *Deleted

## 2022-02-04 NOTE — Telephone Encounter (Signed)
Tess Education officer, museum from [place] called regarding patient having an appt with [place]. I was unable to find a recent referral or appt for this patient. She states patient has a thigh graft that need follow up on. I let Tess know I would check with our referral team to see if this is in progress and have them call her back on Monday. Her number is [phone]. ?

## 2022-03-26 ENCOUNTER — Emergency Department (HOSPITAL_COMMUNITY)
Admission: EM | Admit: 2022-03-26 | Discharge: 2022-03-27 | Disposition: A | Discharge status: Home / Self Care | Payer: Self-pay | Attending: Emergency Medicine | Admitting: Emergency Medicine

## 2022-03-26 ENCOUNTER — Emergency Department (HOSPITAL_COMMUNITY): Payer: Self-pay

## 2022-03-26 ENCOUNTER — Other Ambulatory Visit: Payer: Self-pay

## 2022-03-26 DIAGNOSIS — N186 End stage renal disease: Secondary | ICD-10-CM | POA: Insufficient documentation

## 2022-03-26 DIAGNOSIS — D631 Anemia in chronic kidney disease: Secondary | ICD-10-CM | POA: Insufficient documentation

## 2022-03-26 DIAGNOSIS — R778 Other specified abnormalities of plasma proteins: Secondary | ICD-10-CM | POA: Insufficient documentation

## 2022-03-26 DIAGNOSIS — N189 Chronic kidney disease, unspecified: Secondary | ICD-10-CM

## 2022-03-26 DIAGNOSIS — R0789 Other chest pain: Secondary | ICD-10-CM | POA: Insufficient documentation

## 2022-03-26 DIAGNOSIS — Z992 Dependence on renal dialysis: Secondary | ICD-10-CM | POA: Insufficient documentation

## 2022-03-26 DIAGNOSIS — R002 Palpitations: Secondary | ICD-10-CM | POA: Insufficient documentation

## 2022-03-26 DIAGNOSIS — R079 Chest pain, unspecified: Secondary | ICD-10-CM

## 2022-03-26 DIAGNOSIS — R42 Dizziness and giddiness: Secondary | ICD-10-CM | POA: Insufficient documentation

## 2022-03-26 LAB — I-STAT CHEM 8, ED
BUN: 26 mg/dL — ABNORMAL HIGH (ref 6–20)
Calcium, Ion: 1.04 mmol/L — ABNORMAL LOW (ref 1.15–1.40)
Chloride: 101 mmol/L (ref 98–111)
Creatinine, Ser: 13.6 mg/dL — ABNORMAL HIGH (ref 0.61–1.24)
Glucose, Bld: 86 mg/dL (ref 70–99)
HCT: 31 % — ABNORMAL LOW (ref 39.0–52.0)
Hemoglobin: 10.5 g/dL — ABNORMAL LOW (ref 13.0–17.0)
Potassium: 3.5 mmol/L (ref 3.5–5.1)
Sodium: 140 mmol/L (ref 135–145)
TCO2: 27 mmol/L (ref 22–32)

## 2022-03-26 LAB — TROPONIN I (HIGH SENSITIVITY): Troponin I (High Sensitivity): 55 ng/L — ABNORMAL HIGH (ref <18)

## 2022-03-26 LAB — DIFFERENTIAL
Abs Immature Granulocytes: 0.01 10*3/uL (ref 0.00–0.07)
Basophils Absolute: 0 10*3/uL (ref 0.0–0.1)
Basophils Relative: 1 %
Eosinophils Absolute: 0 10*3/uL (ref 0.0–0.5)
Eosinophils Relative: 1 %
Immature Granulocytes: 0 %
Lymphocytes Relative: 21 %
Lymphs Abs: 0.9 10*3/uL (ref 0.7–4.0)
Monocytes Absolute: 0.6 10*3/uL (ref 0.1–1.0)
Monocytes Relative: 13 %
Neutro Abs: 2.7 10*3/uL (ref 1.7–7.7)
Neutrophils Relative %: 64 %

## 2022-03-26 LAB — CBC
HCT: 29.7 % — ABNORMAL LOW (ref 39.0–52.0)
Hemoglobin: 10 g/dL — ABNORMAL LOW (ref 13.0–17.0)
MCH: 31.5 pg (ref 26.0–34.0)
MCHC: 33.7 g/dL (ref 30.0–36.0)
MCV: 93.7 fL (ref 80.0–100.0)
Platelets: 167 10*3/uL (ref 150–400)
RBC: 3.17 MIL/uL — ABNORMAL LOW (ref 4.22–5.81)
RDW: 13 % (ref 11.5–15.5)
WBC: 4.2 10*3/uL (ref 4.0–10.5)
nRBC: 0 % (ref 0.0–0.2)

## 2022-03-26 LAB — COMPREHENSIVE METABOLIC PANEL
ALT: 16 U/L (ref 0–44)
AST: 23 U/L (ref 15–41)
Albumin: 3.8 g/dL (ref 3.5–5.0)
Alkaline Phosphatase: 50 U/L (ref 38–126)
Anion gap: 16 — ABNORMAL HIGH (ref 5–15)
BUN: 27 mg/dL — ABNORMAL HIGH (ref 6–20)
CO2: 25 mmol/L (ref 22–32)
Calcium: 10 mg/dL (ref 8.9–10.3)
Chloride: 99 mmol/L (ref 98–111)
Creatinine, Ser: 12.35 mg/dL — ABNORMAL HIGH (ref 0.61–1.24)
GFR, Estimated: 5 mL/min — ABNORMAL LOW (ref >60)
Glucose, Bld: 87 mg/dL (ref 70–99)
Potassium: 3.6 mmol/L (ref 3.5–5.1)
Sodium: 140 mmol/L (ref 135–145)
Total Bilirubin: 0.8 mg/dL (ref 0.3–1.2)
Total Protein: 7.6 g/dL (ref 6.5–8.1)

## 2022-03-26 LAB — APTT: aPTT: 33 seconds (ref 24–36)

## 2022-03-26 LAB — PROTIME-INR
INR: 1 (ref 0.8–1.2)
Prothrombin Time: 13.4 seconds (ref 11.4–15.2)

## 2022-03-26 LAB — CBG MONITORING, ED: Glucose-Capillary: 96 mg/dL (ref 70–99)

## 2022-03-26 LAB — ETHANOL: Alcohol, Ethyl (B): <10 mg/dL (ref <10)

## 2022-03-26 MED ORDER — CLONIDINE HCL 0.1 MG PO TABS
0.1000 mg | ORAL_TABLET | Freq: Once | ORAL | Status: AC
  Administered 2022-03-26: 0.1 mg via ORAL
  Filled 2022-03-26: qty 1

## 2022-03-27 LAB — TROPONIN I (HIGH SENSITIVITY): Troponin I (High Sensitivity): 56 ng/L — ABNORMAL HIGH (ref <18)

## 2022-03-28 ENCOUNTER — Emergency Department (HOSPITAL_COMMUNITY): Payer: Self-pay

## 2022-03-28 ENCOUNTER — Other Ambulatory Visit: Payer: Self-pay

## 2022-03-28 ENCOUNTER — Emergency Department (HOSPITAL_COMMUNITY)
Admission: EM | Admit: 2022-03-28 | Discharge: 2022-03-29 | Disposition: A | Discharge status: Home / Self Care | Payer: Self-pay | Attending: Emergency Medicine | Admitting: Emergency Medicine

## 2022-03-28 ENCOUNTER — Encounter (HOSPITAL_COMMUNITY): Payer: Self-pay

## 2022-03-28 DIAGNOSIS — Z992 Dependence on renal dialysis: Secondary | ICD-10-CM | POA: Insufficient documentation

## 2022-03-28 DIAGNOSIS — R002 Palpitations: Secondary | ICD-10-CM | POA: Insufficient documentation

## 2022-03-28 DIAGNOSIS — R079 Chest pain, unspecified: Secondary | ICD-10-CM

## 2022-03-28 DIAGNOSIS — F419 Anxiety disorder, unspecified: Secondary | ICD-10-CM | POA: Insufficient documentation

## 2022-03-28 DIAGNOSIS — I7121 Aneurysm of the ascending aorta, without rupture: Secondary | ICD-10-CM

## 2022-03-28 DIAGNOSIS — R0602 Shortness of breath: Secondary | ICD-10-CM

## 2022-03-28 DIAGNOSIS — R778 Other specified abnormalities of plasma proteins: Secondary | ICD-10-CM | POA: Insufficient documentation

## 2022-03-28 DIAGNOSIS — N186 End stage renal disease: Secondary | ICD-10-CM

## 2022-03-28 LAB — CBC WITH DIFFERENTIAL/PLATELET
Abs Immature Granulocytes: 0.01 10*3/uL (ref 0.00–0.07)
Basophils Absolute: 0 10*3/uL (ref 0.0–0.1)
Basophils Relative: 1 %
Eosinophils Absolute: 0 10*3/uL (ref 0.0–0.5)
Eosinophils Relative: 1 %
HCT: 30.7 % — ABNORMAL LOW (ref 39.0–52.0)
Hemoglobin: 9.9 g/dL — ABNORMAL LOW (ref 13.0–17.0)
Immature Granulocytes: 0 %
Lymphocytes Relative: 24 %
Lymphs Abs: 0.9 10*3/uL (ref 0.7–4.0)
MCH: 30.7 pg (ref 26.0–34.0)
MCHC: 32.2 g/dL (ref 30.0–36.0)
MCV: 95 fL (ref 80.0–100.0)
Monocytes Absolute: 0.6 10*3/uL (ref 0.1–1.0)
Monocytes Relative: 16 %
Neutro Abs: 2.3 10*3/uL (ref 1.7–7.7)
Neutrophils Relative %: 58 %
Platelets: 181 10*3/uL (ref 150–400)
RBC: 3.23 MIL/uL — ABNORMAL LOW (ref 4.22–5.81)
RDW: 12.8 % (ref 11.5–15.5)
WBC: 3.9 10*3/uL — ABNORMAL LOW (ref 4.0–10.5)
nRBC: 0 % (ref 0.0–0.2)

## 2022-03-28 LAB — BASIC METABOLIC PANEL
Anion gap: 12 (ref 5–15)
BUN: 17 mg/dL (ref 6–20)
CO2: 32 mmol/L (ref 22–32)
Calcium: 8.9 mg/dL (ref 8.9–10.3)
Chloride: 97 mmol/L — ABNORMAL LOW (ref 98–111)
Creatinine, Ser: 9.08 mg/dL — ABNORMAL HIGH (ref 0.61–1.24)
GFR, Estimated: 7 mL/min — ABNORMAL LOW (ref >60)
Glucose, Bld: 87 mg/dL (ref 70–99)
Potassium: 4 mmol/L (ref 3.5–5.1)
Sodium: 141 mmol/L (ref 135–145)

## 2022-03-28 LAB — D-DIMER, QUANTITATIVE: D-Dimer, Quant: 1.9 ug/mL-FEU — ABNORMAL HIGH (ref 0.00–0.50)

## 2022-03-28 LAB — TROPONIN I (HIGH SENSITIVITY): Troponin I (High Sensitivity): 31 ng/L — ABNORMAL HIGH (ref <18)

## 2022-03-28 MED ORDER — ACETAMINOPHEN 500 MG PO TABS
1000.0000 mg | ORAL_TABLET | Freq: Once | ORAL | Status: AC
  Administered 2022-03-28: 1000 mg via ORAL
  Filled 2022-03-28: qty 2

## 2022-03-28 NOTE — ED Notes (Signed)
ED Provider at bedside. 

## 2022-03-28 NOTE — ED Provider Notes (Signed)
[name] Provider Note   CSN: [id number] Arrival date & time: 03/28/22  1934     History  Chief Complaint  Patient presents with   Shortness of Breath    [name] is a 51 y.o. male.  Patient presents with intermittent palpitations and chest pain shortness of breath for the past 3 days.  Patient was seen 2 days prior for similar.  Symptoms were improved however worsening again.  No known heart problems or blood clot history.  No recent surgeries.  No vomiting or exertional symptoms.  Patient end-stage renal disease with dialysis, dialyzed today without difficulty.  Dialyzes from the right thigh.  No fevers chills or cough.       Home Medications Prior to Admission medications   Medication Sig Start Date End Date Taking? Authorizing Provider  acetaminophen (TYLENOL) 500 MG tablet Take 1,000 mg by mouth daily as needed (for pain or headaches).    Yes [provider]  amLODipine (NORVASC) 10 MG tablet Take 10 mg by mouth daily at 2 PM.   Yes [provider]  B Complex-C-Folic Acid (DIALYVITE TABLET) TABS Take 1 tablet by mouth daily. 06/29/20  Yes [provider]  cloNIDine (CATAPRES) 0.2 MG tablet Take 0.5 tablets (0.1 mg total) by mouth daily. Patient taking differently: Take 0.1 mg by mouth 2 (two) times daily. 07/11/21  Yes Zigmund Daniel., MD  lidocaine-prilocaine (EMLA) cream Apply 1 application topically daily as needed (port access).  02/03/20  Yes [provider]  TUMS ULTRA 1000 1000 MG chewable tablet Chew 4,000 mg by mouth 3 (three) times daily with meals. 03/14/20  Yes [provider]  fluticasone (FLONASE) 50 MCG/ACT nasal spray Place 2 sprays into both nostrils daily. Patient not taking: Reported on 06/15/2018 07/25/17 02/20/20  Garlon Hatchet, PA-C      Allergies    Ivp dye [iodinated contrast media], Lisinopril, and Solu-medrol [methylprednisolone]    Review of  Systems   Review of Systems  Constitutional:  Negative for chills and fever.  HENT:  Negative for congestion.   Eyes:  Negative for visual disturbance.  Respiratory:  Positive for shortness of breath. Negative for cough.   Cardiovascular:  Positive for chest pain and palpitations.  Gastrointestinal:  Negative for abdominal pain and vomiting.  Genitourinary:  Negative for dysuria and flank pain.  Musculoskeletal:  Negative for back pain, neck pain and neck stiffness.  Skin:  Negative for rash.  Neurological:  Negative for light-headedness and headaches.    Physical Exam Updated Vital Signs BP (!) 171/101   Pulse 74   Temp 97.9 F (36.6 C) (Oral)   Resp 19   Ht 6' (1.829 m)   Wt 68 kg   SpO2 100%   BMI 20.33 kg/m  Physical Exam Vitals and nursing note reviewed.  Constitutional:      General: He is not in acute distress.    Appearance: He is well-developed.  HENT:     Head: Normocephalic and atraumatic.     Mouth/Throat:     Mouth: Mucous membranes are moist.  Eyes:     General:        Right eye: No discharge.        Left eye: No discharge.     Conjunctiva/sclera: Conjunctivae normal.  Neck:     Trachea: No tracheal deviation.  Cardiovascular:     Rate and Rhythm: Normal rate and regular rhythm.     Heart sounds:  No murmur heard. Pulmonary:     Effort: Pulmonary effort is normal.     Breath sounds: Normal breath sounds.  Abdominal:     General: There is no distension.     Palpations: Abdomen is soft.     Tenderness: There is no abdominal tenderness. There is no guarding.  Musculoskeletal:     Cervical back: Normal range of motion and neck supple. No rigidity.     Right lower leg: No edema.     Left lower leg: No edema.  Skin:    General: Skin is warm.     Capillary Refill: Capillary refill takes less than 2 seconds.     Findings: No rash.  Neurological:     General: No focal deficit present.     Mental Status: He is alert.     Cranial Nerves: No cranial  nerve deficit.  Psychiatric:        Mood and Affect: Mood is anxious.     ED Results / Procedures / Treatments   Labs (all labs ordered are listed, but only abnormal results are displayed) Labs Reviewed  BASIC METABOLIC PANEL - Abnormal; Notable for the following components:      Result Value   Chloride 97 (*)    Creatinine, Ser 9.08 (*)    GFR, Estimated 7 (*)    All other components within normal limits  CBC WITH DIFFERENTIAL/PLATELET - Abnormal; Notable for the following components:   WBC 3.9 (*)    RBC 3.23 (*)    Hemoglobin 9.9 (*)    HCT 30.7 (*)    All other components within normal limits  TROPONIN I (HIGH SENSITIVITY) - Abnormal; Notable for the following components:   Troponin I (High Sensitivity) 31 (*)    All other components within normal limits  D-DIMER, QUANTITATIVE  TROPONIN I (HIGH SENSITIVITY)    EKG EKG Interpretation  Date/Time:  Monday March 28 2022 20:17:34 EDT Ventricular Rate:  71 PR Interval:  192 QRS Duration: 80 QT Interval:  472 QTC Calculation: 512 R Axis:   30 Text Interpretation: Normal sinus rhythm Possible Left atrial enlargement Left ventricular hypertrophy ( Sokolow-Lyon , Romhilt-Estes ) Abnormal QRS-T angle, consider primary T wave abnormality Prolonged QT Abnormal ECG When compared with ECG of 26-Mar-2022 17:39, PREVIOUS ECG IS PRESENT Confirmed by Blane Ohara [phone]) on 03/28/2022 9:54:52 PM  Radiology DG Chest 2 View  Result Date: 03/28/2022 CLINICAL DATA:  Mid chest pain x2 days. EXAM: CHEST - 2 VIEW COMPARISON:  November 13, 2021 FINDINGS: The heart size and mediastinal contours are within normal limits. Both lungs are clear. Radiopaque surgical clips are seen overlying the soft tissues of the thoracic inlet. The visualized skeletal structures are unremarkable. IMPRESSION: No active cardiopulmonary disease. Electronically Signed   By: Aram Candela M.D.   On: 03/28/2022 20:43    Procedures Procedures    Medications  Ordered in ED Medications  acetaminophen (TYLENOL) tablet 1,000 mg (has no administration in time range)    ED Course/ Medical Decision Making/ A&P                           Medical Decision Making Amount and/or Complexity of Data Reviewed Labs: ordered.  Risk OTC drugs.   Patient with history of renal disease dialyzed today presents with worsening episodes of chest pain, shortness of breath and palpitations.  Reviewed medical records and patient had troponins that stabilized in the 50s and no acute ST  elevation on evaluation in the emergency room 6/24.  Referral for close outpatient follow-up with cardiology given at that time.  Patient feels symptoms are similar but more severe since leaving the ER.  With second visit, renal disease history repeat cardiac work-up initiated, troponin mild elevated 31 less than previous, normal potassium, elevated renal function as expected.  Chest x-ray reviewed no acute abnormalities.  Patient care signed out to follow-up troponin and D-dimer for final disposition.        Final Clinical Impression(s) / ED Diagnoses Final diagnoses:  Shortness of breath  Acute chest pain  ESRD on dialysis [facility])    Rx / DC Orders ED Discharge Orders     None         Blane Ohara, MD 03/29/22 0002

## 2022-03-29 ENCOUNTER — Emergency Department (HOSPITAL_COMMUNITY): Payer: Self-pay

## 2022-03-29 LAB — TROPONIN I (HIGH SENSITIVITY): Troponin I (High Sensitivity): 31 ng/L — ABNORMAL HIGH (ref <18)

## 2022-03-29 MED ORDER — DIPHENHYDRAMINE HCL 50 MG/ML IJ SOLN
50.0000 mg | Freq: Once | INTRAMUSCULAR | Status: AC
  Filled 2022-03-29: qty 1

## 2022-03-29 MED ORDER — METHYLPREDNISOLONE SODIUM SUCC 40 MG IJ SOLR
40.0000 mg | Freq: Once | INTRAMUSCULAR | Status: AC
  Administered 2022-03-29: 40 mg via INTRAVENOUS
  Filled 2022-03-29: qty 1

## 2022-03-29 MED ORDER — IOHEXOL 350 MG/ML SOLN
59.0000 mL | Freq: Once | INTRAVENOUS | Status: AC | PRN
  Administered 2022-03-29: 59 mL via INTRAVENOUS

## 2022-03-29 MED ORDER — DIPHENHYDRAMINE HCL 25 MG PO CAPS
50.0000 mg | ORAL_CAPSULE | Freq: Once | ORAL | Status: AC
Start: 2022-03-29 — End: 2022-03-29
  Administered 2022-03-29: 50 mg via ORAL
  Filled 2022-03-29: qty 2

## 2022-03-29 MED ORDER — ONDANSETRON HCL 4 MG/2ML IJ SOLN
4.0000 mg | Freq: Once | INTRAMUSCULAR | Status: AC
  Administered 2022-03-29: 4 mg via INTRAVENOUS
  Filled 2022-03-29 (×2): qty 2

## 2022-03-29 NOTE — ED Notes (Signed)
Patient transported to CT 

## 2022-03-29 NOTE — ED Notes (Signed)
Patient verbalizes understanding of d/c instructions. Opportunities for questions and answers were provided. Pt d/c from ED and ambulated to lobby.  

## 2022-04-11 ENCOUNTER — Ambulatory Visit: Payer: Self-pay | Admitting: Cardiology

## 2022-04-15 ENCOUNTER — Other Ambulatory Visit: Payer: Self-pay | Admitting: Student

## 2022-04-15 MED ORDER — PREDNISONE 50 MG PO TABS: ORAL_TABLET | ORAL | 0 refills | Status: DC

## 2022-04-15 MED ORDER — DIPHENHYDRAMINE HCL 50 MG PO TABS: 50.0000 mg | ORAL_TABLET | Freq: Once | ORAL | 0 refills | Status: DC

## 2022-04-20 ENCOUNTER — Other Ambulatory Visit (HOSPITAL_COMMUNITY): Payer: Self-pay | Admitting: Physician Assistant

## 2022-04-21 ENCOUNTER — Ambulatory Visit (HOSPITAL_COMMUNITY)
Admission: RE | Admit: 2022-04-21 | Discharge: 2022-04-21 | Disposition: A | Discharge status: Home / Self Care | Payer: Self-pay | Source: Ambulatory Visit | Attending: Nephrology | Admitting: Nephrology

## 2022-04-21 ENCOUNTER — Other Ambulatory Visit: Payer: Self-pay

## 2022-04-21 ENCOUNTER — Other Ambulatory Visit (HOSPITAL_COMMUNITY): Payer: Self-pay | Admitting: Nephrology

## 2022-04-21 DIAGNOSIS — N186 End stage renal disease: Secondary | ICD-10-CM | POA: Insufficient documentation

## 2022-04-21 DIAGNOSIS — D649 Anemia, unspecified: Secondary | ICD-10-CM | POA: Insufficient documentation

## 2022-04-21 DIAGNOSIS — F32A Depression, unspecified: Secondary | ICD-10-CM | POA: Insufficient documentation

## 2022-04-21 DIAGNOSIS — Z992 Dependence on renal dialysis: Secondary | ICD-10-CM | POA: Insufficient documentation

## 2022-04-21 DIAGNOSIS — T82898A Other specified complication of vascular prosthetic devices, implants and grafts, initial encounter: Secondary | ICD-10-CM | POA: Insufficient documentation

## 2022-04-21 DIAGNOSIS — Z91041 Radiographic dye allergy status: Secondary | ICD-10-CM | POA: Insufficient documentation

## 2022-04-21 DIAGNOSIS — Z87891 Personal history of nicotine dependence: Secondary | ICD-10-CM | POA: Insufficient documentation

## 2022-04-21 DIAGNOSIS — I120 Hypertensive chronic kidney disease with stage 5 chronic kidney disease or end stage renal disease: Secondary | ICD-10-CM | POA: Insufficient documentation

## 2022-04-21 DIAGNOSIS — F419 Anxiety disorder, unspecified: Secondary | ICD-10-CM | POA: Insufficient documentation

## 2022-04-21 DIAGNOSIS — Y841 Kidney dialysis as the cause of abnormal reaction of the patient, or of later complication, without mention of misadventure at the time of the procedure: Secondary | ICD-10-CM | POA: Insufficient documentation

## 2022-04-21 HISTORY — PX: IR US GUIDE VASC ACCESS RIGHT: IMG2390

## 2022-04-21 HISTORY — PX: IR DIALY SHUNT INTRO NEEDLE/INTRACATH INITIAL W/IMG RIGHT: IMG6115

## 2022-04-21 HISTORY — PX: IR AV DIALY SHUNT INTRO NEEDLE/INTRACATH INITIAL W/PTA/IMG RIGHT: IMG6116

## 2022-04-21 MED ORDER — LIDOCAINE HCL 1 % IJ SOLN
INTRAMUSCULAR | Status: AC
  Filled 2022-04-21: qty 20

## 2022-04-21 MED ORDER — FENTANYL CITRATE (PF) 100 MCG/2ML IJ SOLN
INTRAMUSCULAR | Status: AC | PRN
  Administered 2022-04-21: 50 ug via INTRAVENOUS

## 2022-04-21 MED ORDER — MIDAZOLAM HCL 2 MG/2ML IJ SOLN
INTRAMUSCULAR | Status: AC
  Filled 2022-04-21: qty 2

## 2022-04-21 MED ORDER — MIDAZOLAM HCL 2 MG/2ML IJ SOLN
INTRAMUSCULAR | Status: AC | PRN
  Administered 2022-04-21: 1 mg via INTRAVENOUS

## 2022-04-21 MED ORDER — IOHEXOL 300 MG/ML  SOLN: 100.0000 mL | Freq: Once | INTRAMUSCULAR | Status: DC | PRN

## 2022-04-21 MED ORDER — IOHEXOL 300 MG/ML  SOLN
100.0000 mL | Freq: Once | INTRAMUSCULAR | Status: DC | PRN
Start: 2022-04-21 — End: 2022-04-22

## 2022-04-21 MED ORDER — FENTANYL CITRATE (PF) 100 MCG/2ML IJ SOLN
INTRAMUSCULAR | Status: AC
  Filled 2022-04-21: qty 2

## 2022-04-21 MED ORDER — DIPHENHYDRAMINE HCL 50 MG/ML IJ SOLN
INTRAMUSCULAR | Status: AC
  Administered 2022-04-21: 25 mg via INTRAVENOUS
  Filled 2022-04-21: qty 1

## 2022-04-21 MED ORDER — DIPHENHYDRAMINE HCL 50 MG/ML IJ SOLN
25.0000 mg | Freq: Once | INTRAMUSCULAR | Status: AC
Start: 2022-04-21 — End: 2022-04-21

## 2022-04-21 MED ORDER — SODIUM CHLORIDE 0.9 % IV SOLN: INTRAVENOUS | Status: DC

## 2022-04-21 NOTE — H&P (Signed)
Chief Complaint: Patient was seen in consultation today for prolonged bleeding from right thigh graft.  Referring Physician(s): [name]  Supervising Physician: Aletta Edouard  Patient Status: [name] - Out-pt  History of Present Illness: [name] is a 51 y.o. 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 [name] is well know to IR due to multiple shuntograms, most recently he underwent declot and balloon angioplasty of recurrent venous outflow stenosis on 07/16/21 with Dr. Vernard Gambles. Per patient at his last couple HD sessions he has had persistent bleeding from his graft, so much so that he has to "pinch it" to stop the bleeding. He has not had any other issues with the graft. He understands the procedure today and is agreeable to proceed. He has taken his contrast allergy pre-medications as prescribed.  Past Medical History:  Diagnosis Date   Anemia    Anxiety    Depression    ESRD on hemodialysis ([place])    HD [facility] MWF   Hypertension    Thyroid disease     Past Surgical History:  Procedure Laterality Date   ARTERIOVENOUS GRAFT PLACEMENT Left    "forearm, it's not working; thigh"    ARTERIOVENOUS GRAFT PLACEMENT Left 11/09/2015   AV FISTULA PLACEMENT Right 01/10/2017   Procedure: INSERTION OF ARTERIOVENOUS Right thigh GORE-TEX GRAFT;  Surgeon: [name], MD;  Location: [place];  Service: Vascular;  Laterality: Right;   FALSE ANEURYSM REPAIR Left 11/09/2015   Procedure: REPAIR OF LEFT FEMORAL PSEUDOANEURYSM; REVISION  OF LEFT THIGH ARTERIOVENOUS GRAFT USING 6MM X 10 CM GORETEX GRAFT ;  Surgeon: [name], MD;  Location: [place];  Service: Vascular;  Laterality: Left;   IR AV DIALY SHUNT INTRO NEEDLE/INTRACATH INITIAL W/PTA/IMG RIGHT Right 02/06/2019   IR AV DIALY SHUNT INTRO  NEEDLE/INTRACATH INITIAL W/PTA/IMG RIGHT Right 05/14/2019   IR AV DIALY SHUNT INTRO NEEDLE/INTRACATH INITIAL W/PTA/IMG RIGHT Right 08/18/2020   IR DIALY SHUNT INTRO NEEDLE/INTRACATH INITIAL W/IMG RIGHT Right 09/17/2019   IR DIALY SHUNT INTRO NEEDLE/INTRACATH INITIAL W/IMG RIGHT Right 12/10/2019   IR FLUORO GUIDE CV LINE RIGHT  07/10/2021   IR GENERIC HISTORICAL  07/15/2016   IR US GUIDE VASC ACCESS LEFT 07/15/2016 [name], MD [facility]   IR GENERIC HISTORICAL Left 07/15/2016   IR THROMBECTOMY AV FISTULA W/THROMBOLYSIS/PTA INC/SHUNT/IMG LEFT 07/15/2016 [name], MD [facility]   IR GENERIC HISTORICAL  10/05/2016   IR US GUIDE VASC ACCESS LEFT 10/05/2016 [name], MD [facility]   IR GENERIC HISTORICAL Left 10/05/2016   IR THROMBECTOMY AV FISTULA W/THROMBOLYSIS/PTA INC/SHUNT/IMG LEFT 10/05/2016 [name], MD [facility]   IR GENERIC HISTORICAL  10/08/2016   IR FLUORO GUIDE CV LINE RIGHT 10/08/2016 [name], MD [facility]   IR GENERIC HISTORICAL  10/08/2016   IR US GUIDE VASC ACCESS RIGHT 10/08/2016 [name], MD [facility]   IR PTA AND STENT ADDL CENTRAL DIALY SEG THRU DIALY CIRCUIT RIGHT Right 12/10/2019   IR RADIOLOGIST EVAL & MGMT  11/26/2019   IR REMOVAL TUN CV CATH W/O FL  03/16/2017   IR REMOVAL TUN CV CATH W/O FL  07/22/2021   IR THROMBECTOMY AV FISTULA W/THROMBOLYSIS/PTA INC/SHUNT/IMG RIGHT Right 06/16/2018   IR THROMBECTOMY AV FISTULA W/THROMBOLYSIS/PTA INC/SHUNT/IMG RIGHT Right 06/17/2018   IR THROMBECTOMY AV FISTULA W/THROMBOLYSIS/PTA INC/SHUNT/IMG RIGHT Right 07/15/2021  IR US GUIDE VASC ACCESS RIGHT  06/17/2018   IR US GUIDE VASC ACCESS RIGHT  06/16/2018   IR US GUIDE VASC ACCESS RIGHT  02/06/2019   IR US GUIDE VASC ACCESS RIGHT  05/14/2019   IR US GUIDE VASC ACCESS RIGHT  09/17/2019   IR US GUIDE VASC ACCESS RIGHT  12/10/2019   IR US GUIDE VASC ACCESS RIGHT  08/18/2020   IR US GUIDE VASC ACCESS RIGHT  07/10/2021   IR US GUIDE VASC ACCESS RIGHT  07/19/2021    PARATHYROIDECTOMY N/A 04/24/2014   Procedure: TOTAL 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;  Surgeon: [name], MD;  Location: [place];  Service: General;  Laterality: N/A;  NECK AND LEFT FOREARM   PERIPHERAL VASCULAR CATHETERIZATION N/A 05/05/2016   Procedure: A/V Shuntogram;  Surgeon: Conrad Avoca, MD;  Location: [place] [facility];  Service: Cardiovascular;  Laterality: N/A;   PERIPHERAL VASCULAR CATHETERIZATION Left 05/05/2016   Procedure: Peripheral Vascular Balloon Angioplasty;  Surgeon: Conrad Swisher, MD;  Location: [place] [facility];  Service: Cardiovascular;  Laterality: Left;   PSEUDOANEURYSM REPAIR Left 11/09/2015   TEE WITHOUT CARDIOVERSION N/A 02/19/2021   Procedure: TRANSESOPHAGEAL ECHOCARDIOGRAM (TEE);  Surgeon: [name], MD;  Location: [facility] ENDOSCOPY;  Service: Cardiovascular;  Laterality: N/A;   THROMBECTOMY / ARTERIOVENOUS GRAFT REVISION Left 08/18/2015   thigh   THROMBECTOMY AND REVISION OF ARTERIOVENTOUS (AV) GORETEX  GRAFT Left 08/23/2015   Procedure: THROMBECTOMY AND REVISION OF ARTERIOVENTOUS (AV) GORETEX  GRAFT LEFT THIGH ;  Surgeon: [name], MD;  Location: [place];  Service: Vascular;  Laterality: Left;   THROMBECTOMY W/ EMBOLECTOMY Left 08/18/2015   Procedure: THROMBECTOMY  AND REVISION ARTERIOVENOUS GORE-TEX GRAFT/LEFT THIGH;  Surgeon: [name], MD;  Location: [name];  Service: Vascular;  Laterality: Left;   THROMBECTOMY W/ EMBOLECTOMY Right 06/19/2018   Procedure: THROMBECTOMY AND REVISION OF RIGHT THIGH  ARTERIOVENOUS GORE-TEX GRAFT;  Surgeon: [name], MD;  Location[name];  Service: Vascular;  Laterality: Right;   UPPER EXTREMITY VENOGRAPHY Bilateral 12/27/2016   Procedure: Upper Extremity Venography;  Surgeon: [name], MD;  Location: [place] [facility];  Service: Cardiovascular;  Laterality: Bilateral;   VENOGRAM Left 05/05/2016   Procedure: Venogram;  Surgeon: Conrad [place], MD;  Location: [place] [facility];   Service: Cardiovascular;  Laterality: Left;  lower extremity    Allergies: Ivp dye [iodinated contrast media], Lisinopril, and Solu-medrol [methylprednisolone]  Medications: Prior to Admission medications   Medication Sig Start Date End Date Taking? Authorizing Provider  acetaminophen (TYLENOL) 500 MG tablet Take 1,000 mg by mouth daily as needed (for pain or headaches).    Yes [provider]  amLODipine (NORVASC) 10 MG tablet Take 10 mg by mouth daily at 2 PM.   Yes [provider]  B Complex-C-Folic Acid (DIALYVITE TABLET) TABS Take 1 tablet by mouth daily. 06/29/20  Yes [provider]  cloNIDine (CATAPRES) 0.2 MG tablet Take 0.5 tablets (0.1 mg total) by mouth daily. Patient taking differently: Take 0.1 mg by mouth 2 (two) times daily. 07/11/21  Yes Elodia Florence., MD  TUMS ULTRA 1000 1000 MG chewable tablet Chew 4,000 mg by mouth 3 (three) times daily with meals. 03/14/20  Yes [provider]  diphenhydrAMINE (BENADRYL) 50 MG tablet Take 1 tablet (50 mg total) by mouth once for 1 dose. Take 50 mg 1 hour before your procedure. 04/15/22 04/15/22  Han, Aimee H, PA-C  lidocaine-prilocaine (EMLA)  cream Apply 1 application topically daily as needed (port access).  02/03/20   [provider]  predniSONE (DELTASONE) 50 MG tablet Take 50 mg Prednisone 13 hours, 7 hours, and 1 hour before your procedure. 04/15/22   Han, Aimee H, PA-C  fluticasone (FLONASE) 50 MCG/ACT nasal spray Place 2 sprays into both nostrils daily. Patient not taking: Reported on 06/15/2018 07/25/17 02/20/20  Larene Pickett, PA-C     Family History  Problem Relation Age of Onset   Renal Disease Neg Hx     Social History   Socioeconomic History   Marital status: Married    Spouse name: Not on file   Number of children: Not on file   Years of education: Not on file   Highest education level: Not on file  Occupational History   Not on file  Tobacco Use   Smoking status:  Former    Types: Cigarettes    Quit date: 03/21/1986    Years since quitting: 36.1   Smokeless tobacco: Never  Vaping Use   Vaping Use: Never used  Substance and Sexual Activity   Alcohol use: No    Alcohol/week: 0.0 standard drinks of alcohol   Drug use: No   Sexual activity: Not on file  Other Topics Concern   Not on file  Social History Narrative   Not on file   Social Determinants of Health   Financial Resource Strain: Not on file  Food Insecurity: Not on file  Transportation Needs: Not on file  Physical Activity: Not on file  Stress: Not on file  Social Connections: Not on file     Review of Systems: A 12 point ROS discussed and pertinent positives are indicated in the HPI above.  All other systems are negative.  Review of Systems  Constitutional:  Negative for chills and fever.  Respiratory:  Negative for cough and shortness of breath.   Cardiovascular:  Negative for chest pain.  Gastrointestinal:  Negative for abdominal pain, diarrhea, nausea and vomiting.  Musculoskeletal:  Negative for back pain.  Neurological:  Negative for dizziness and headaches.    Vital Signs: BP (!) 158/83   Pulse 70   Temp (!) 97.2 F (36.2 C) (Temporal)   Resp 18   Ht 6' (1.829 m)   Wt 147 lb 11.3 oz (67 kg)   SpO2 100%   BMI 20.03 kg/m   Physical Exam Vitals reviewed.  Constitutional:      General: He is not in acute distress. HENT:     Head: Normocephalic.     Mouth/Throat:     Mouth: Mucous membranes are moist.     Pharynx: Oropharynx is clear. No oropharyngeal exudate or posterior oropharyngeal erythema.  Cardiovascular:     Rate and Rhythm: Normal rate and regular rhythm.     Comments: (+) right thigh graft with palpable thrill and audible bruit. No open wounds or active bleeding. Abdominal:     General: There is no distension.     Palpations: Abdomen is soft.     Tenderness: There is no abdominal tenderness.  Skin:    General: Skin is warm and dry.   Neurological:     Mental Status: He is alert and oriented to person, place, and time.  Psychiatric:        Mood and Affect: Mood normal.        Behavior: Behavior normal.        Thought Content: Thought content normal.  Judgment: Judgment normal.      MD Evaluation Airway: WNL Heart: WNL Abdomen: WNL Chest/ Lungs: WNL ASA  Classification: 3 Mallampati/Airway Score: Two   Imaging: CT Angio Chest PE W and/or Wo Contrast  Result Date: 03/29/2022 CLINICAL DATA:  51 year old male with history of positive D-dimer. Suspected pulmonary embolism. EXAM: CT ANGIOGRAPHY CHEST WITH CONTRAST TECHNIQUE: Multidetector CT imaging of the chest was performed using the standard protocol during bolus administration of intravenous contrast. Multiplanar CT image reconstructions and MIPs were obtained to evaluate the vascular anatomy. RADIATION DOSE REDUCTION: This exam was performed according to the departmental dose-optimization program which includes automated exposure control, adjustment of the mA and/or kV according to patient size and/or use of iterative reconstruction technique. CONTRAST:  20mL OMNIPAQUE IOHEXOL 350 MG/ML SOLN COMPARISON:  Chest CT 02/15/2021. FINDINGS: Cardiovascular: There are no filling defects within the pulmonary arterial tree to suggest pulmonary embolism. Heart size is mildly enlarged with left ventricular dilatation. There is no significant pericardial fluid, thickening or pericardial calcification. There is aortic atherosclerosis, as well as atherosclerosis of the great vessels of the mediastinum and the coronary arteries, including calcified atherosclerotic plaque in the left anterior descending and first diagonal coronary arteries. Ectasia of ascending thoracic aorta (4.1 cm in diameter). Mediastinum/Nodes: No pathologically enlarged mediastinal or hilar lymph nodes. Esophagus is unremarkable in appearance. No axillary lymphadenopathy. Lungs/Pleura: 4 mm subpleural nodule in  the periphery of the right middle lobe (axial image 100 of series 6), stable compared to the prior study, considered definitively benign (presumably a subpleural lymph node). No other larger more suspicious appearing pulmonary nodules or masses are noted. No acute consolidative airspace disease. No pleural effusions. Very mild ground-glass attenuation and interlobular septal thickening noted throughout the lungs bilaterally suggesting a background of mild interstitial pulmonary edema. Upper Abdomen: Superior aspect of a left polycystic kidney incompletely imaged. Musculoskeletal: There are no aggressive appearing lytic or blastic lesions noted in the visualized portions of the skeleton. Review of the MIP images confirms the above findings. IMPRESSION: 1. No evidence of pulmonary embolism. 2. Cardiomegaly with left ventricular dilatation and evidence of mild interstitial pulmonary edema in the lungs concerning for mild congestive heart failure. 3. Aortic atherosclerosis, in addition to left anterior descending and first diagonal coronary artery disease. Please note that although the presence of coronary artery calcium documents the presence of coronary artery disease, the severity of this disease and any potential stenosis cannot be assessed on this non-gated CT examination. Assessment for potential risk factor modification, dietary therapy or pharmacologic therapy may be warranted, if clinically indicated. 4. Ectasia of ascending thoracic aorta (4.1 cm in diameter). Recommend annual imaging followup by CTA or MRA. This recommendation follows 2010 ACCF/AHA/AATS/ACR/ASA/SCA/SCAI/SIR/STS/SVM Guidelines for the Diagnosis and Management of Patients with Thoracic Aortic Disease. Circulation. 2010; 121: M384-Y659. Aortic aneurysm NOS (ICD10-I71.9). Aortic Atherosclerosis (ICD10-I70.0). Electronically Signed   By: [name] M.D.   On: 03/29/2022 06:17   DG Chest 2 View  Result Date: 03/28/2022 CLINICAL DATA:  Mid  chest pain x2 days. EXAM: CHEST - 2 VIEW COMPARISON:  November 13, 2021 FINDINGS: The heart size and mediastinal contours are within normal limits. Both lungs are clear. Radiopaque surgical clips are seen overlying the soft tissues of the thoracic inlet. The visualized skeletal structures are unremarkable. IMPRESSION: No active cardiopulmonary disease. Electronically Signed   By: Virgina Norfolk M.D.   On: 03/28/2022 20:43   CT HEAD WO CONTRAST  Result Date: 03/26/2022 CLINICAL DATA:  Dizziness, persistent/recurrent, cardiac or  vascular cause suspected EXAM: CT HEAD WITHOUT CONTRAST TECHNIQUE: Contiguous axial images were obtained from the base of the skull through the vertex without intravenous contrast. RADIATION DOSE REDUCTION: This exam was performed according to the departmental dose-optimization program which includes automated exposure control, adjustment of the mA and/or kV according to patient size and/or use of iterative reconstruction technique. COMPARISON:  11/13/2021 FINDINGS: Brain: No acute intracranial abnormality. Specifically, no hemorrhage, hydrocephalus, mass lesion, acute infarction, or significant intracranial injury. Vascular: No hyperdense vessel or unexpected calcification. Skull: No acute calvarial abnormality. Sinuses/Orbits: No acute findings Other: None IMPRESSION: No acute intracranial abnormality. Electronically Signed   By: Rolm Baptise M.D.   On: 03/26/2022 18:17    Labs:  CBC: Recent Labs    07/11/21 0555 11/13/21 1216 03/26/22 1755 03/26/22 1806 03/28/22 2028  WBC 4.4 4.7 4.2  --  3.9*  HGB 13.5 13.6 10.0* 10.5* 9.9*  HCT 40.0 41.9 29.7* 31.0* 30.7*  PLT 171 177 167  --  181    COAGS: Recent Labs    05/27/21 0347 03/26/22 1755  INR 1.0 1.0  APTT  --  33    BMP: Recent Labs    07/11/21 0555 11/13/21 1216 03/26/22 1755 03/26/22 1806 03/28/22 2028  NA 133* 137 140 140 141  K 4.2 4.1 3.6 3.5 4.0  CL 93* 94* 99 101 97*  CO2 25 28 25   --  32   GLUCOSE 88 102* 87 86 87  BUN 59* 43* 27* 26* 17  CALCIUM 8.2* 8.2* 10.0  --  8.9  CREATININE 13.97* 10.34* 12.35* 13.60* 9.08*  GFRNONAA 4* 6* 5*  --  7*    LIVER FUNCTION TESTS: Recent Labs    05/27/21 0347 05/27/21 0720 07/11/21 0555 03/26/22 1755  BILITOT 0.8  --  0.5 0.8  AST 25  --  18 23  ALT 14  --  10 16  ALKPHOS 65  --  71 50  PROT 8.6*  --  8.5* 7.6  ALBUMIN 4.1 3.9 3.3* 3.8    TUMOR MARKERS: No results for input(s): "AFPTM", "CEA", "CA199", "CHROMGRNA" in the last 8760 hours.  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 Patient was last seen in IR 07/16/21 where he underwent shuntogram of the right thigh graft with thrombectomy and balloon angioplasty (Dr. Vernard Gambles). Patient has a contrast allergy and has been appropriately pre-medicated.  Risks and benefits discussed with the patient including, but not limited to bleeding, infection, vascular injury, pulmonary embolism, need for tunneled HD catheter placement or even death.  All of the patient's questions were answered, patient is agreeable to proceed.  Consent signed and in chart.  Thank you for this interesting consult.  I greatly enjoyed meeting [name] and look forward to participating in their care.  A copy of this report was sent to the requesting provider on this date.  Electronically Signed: Joaquim Nam, PA-C 04/21/2022, 2:24 PM   I spent a total of  25 Minutes in face to face in clinical consultation, greater than 50% of which was counseling/coordinating care for right thigh shuntogram.

## 2022-04-21 NOTE — Progress Notes (Signed)
Pt brought benadryl and Prednisone with him to Short stay. Advised by MD to have pt take both meds at 1100 prior to procedure [name]

## 2022-04-21 NOTE — Procedures (Signed)
Interventional Radiology Procedure Note  Procedure: Right thigh graft shuntogram with venous angioplasty  Complications: None  Estimated Blood Loss: < 10 mL  Findings: AVGG study demonstrates stenoses of outflow common femoral vein, external iliac vein, previously stented external and common iliac veins.  Treated with 10 mm balloon angioplasty with improved result; no significant residual stenosis.  [name]. Kathlene Cote, M.D Pager:  [phone]

## 2022-04-21 NOTE — Progress Notes (Signed)
Patient was given discharge instructions. He verbalized understanding. 

## 2022-04-22 ENCOUNTER — Encounter (HOSPITAL_COMMUNITY): Payer: Self-pay | Admitting: Radiology

## 2022-05-04 ENCOUNTER — Ambulatory Visit (INDEPENDENT_AMBULATORY_CARE_PROVIDER_SITE_OTHER): Payer: Self-pay | Admitting: Cardiovascular Disease

## 2022-05-04 ENCOUNTER — Encounter: Payer: Self-pay | Admitting: Cardiovascular Disease

## 2022-05-04 DIAGNOSIS — I5189 Other ill-defined heart diseases: Secondary | ICD-10-CM

## 2022-05-04 DIAGNOSIS — R011 Cardiac murmur, unspecified: Secondary | ICD-10-CM

## 2022-05-04 DIAGNOSIS — R072 Precordial pain: Secondary | ICD-10-CM

## 2022-05-04 DIAGNOSIS — Z992 Dependence on renal dialysis: Secondary | ICD-10-CM

## 2022-05-04 DIAGNOSIS — N186 End stage renal disease: Secondary | ICD-10-CM

## 2022-05-04 DIAGNOSIS — I361 Nonrheumatic tricuspid (valve) insufficiency: Secondary | ICD-10-CM

## 2022-05-04 DIAGNOSIS — I10 Essential (primary) hypertension: Secondary | ICD-10-CM

## 2022-05-04 DIAGNOSIS — I340 Nonrheumatic mitral (valve) insufficiency: Secondary | ICD-10-CM

## 2022-05-04 DIAGNOSIS — I341 Nonrheumatic mitral (valve) prolapse: Secondary | ICD-10-CM

## 2022-05-04 DIAGNOSIS — R002 Palpitations: Secondary | ICD-10-CM

## 2022-05-04 DIAGNOSIS — Z79899 Other long term (current) drug therapy: Secondary | ICD-10-CM

## 2022-05-04 MED ORDER — DIPHENHYDRAMINE HCL 50 MG PO TABS: 50.0000 mg | ORAL_TABLET | Freq: Once | ORAL | 0 refills | Status: DC

## 2022-05-04 MED ORDER — CLONIDINE HCL 0.1 MG PO TABS: 0.1000 mg | ORAL_TABLET | Freq: Every day | ORAL | 11 refills | Status: DC

## 2022-05-04 MED ORDER — PREDNISONE 50 MG PO TABS: ORAL_TABLET | ORAL | 0 refills | Status: DC

## 2022-05-04 MED ORDER — METOPROLOL SUCCINATE ER 25 MG PO TB24: 25.0000 mg | ORAL_TABLET | Freq: Every day | ORAL | 3 refills | Status: DC

## 2022-05-04 NOTE — Progress Notes (Signed)
Cardiology Office Note    Date:  05/09/2022   ID:  [name], DOB 1971-03-15, MRN [id number]  PCP:  Patient, No Pcp Per  Cardiologist:  Shelva Majestic, MD   Cardiology consult referred through the courtesy of Dr. Delora Fuel following a [facility], ER evaluation on March 26, 2022.   History of Present Illness:  [name] is a 51 y.o. male who has a history of hypertension and end-stage renal disease and has been on dialysis since age 59.  He undergoes dialysis on Monday Wednesday and Fridays at [facility].  In February 2023 he presented to the emergency room with complaints of dizziness and palpitations and perineal pain.  Troponin reportedly was minimally elevated at 19.  ECG showed nonspecific T wave abnormalities with prolonged QT.  Prior peak T waves which were present on his ECG in October 2022 were no longer present.  He was apparently hospitalized on May 15 and discharged Feb 19, 2021 and at that time was felt to be septic with streptococcal bacteremia.  On Feb 16, 2021 he had undergone an echo Doppler study which showed normal LV function with EF 55 to 60% with moderate concentric LVH and grade 2 diastolic dysfunction.  There was mild left atrial dilatation.  There was mild to moderate mitral regurgitation, moderately severe tricuspid regurgitation with aortic sclerosis and mild to moderate aortic insufficiency.  There was mild aortic dilatation of the ascending aorta at 40 mm.  He underwent an transesophageal echocardiogram on Feb 19, 2021 and was not found to have vegetative endocarditis.  At that time EF estimate was 50 to 55% and again had moderate LVH.  There was moderate late systolic prolapse of multiple segments of the anterior leaflet of the mitral valve.  His TR was felt to be moderate and his AR was felt to be trivial.  His ABG did not appear to be infected he was treated with antibiotic therapy.  Subsequently, he had been doing fairly well but on  March 26, 2022 presented again with episodes of dizziness heart palpitations and vague chest discomfort with symptoms occurring the day previously.  He did experience some mild lightheadedness.  He had missed dialysis on Wednesday before but did undergo dialysis on Friday.  CT of his head was without acute abnormality.  He had a normal neuroexam.  His ECG was without ischemic changes.  Again troponin was mildly increased at 55 and 56.  He was discharged and cardiology consultation was recommended.  Presently, [name] denies any anginal type chest pain.  He does note some left lateral chest wall achiness.  He admits to experiencing episodes where his heart speeds up associated with shortness of breath.  He admits to at times feeling tired and dizzy in the morning.  He has been on a regimen of amlodipine 10 mg which he takes at 2 PM and takes clonidine 0.1 mg at bedtime.  He denies any recent fevers chills or night sweats.  He denies any syncope.  He presents for evaluation.   Past Medical History:  Diagnosis Date   Anemia    Anxiety    Depression    ESRD on hemodialysis ([place])    HD [place] MWF   Hypertension    Thyroid disease     Past Surgical History:  Procedure Laterality Date   ARTERIOVENOUS GRAFT PLACEMENT Left    "forearm, it's not working; thigh"    ARTERIOVENOUS GRAFT PLACEMENT Left 11/09/2015  AV FISTULA PLACEMENT Right 01/10/2017   Procedure: INSERTION OF ARTERIOVENOUS Right thigh GORE-TEX GRAFT;  Surgeon: [name], MD;  Location: [facility];  Service: Vascular;  Laterality: Right;   FALSE ANEURYSM REPAIR Left 11/09/2015   Procedure: REPAIR OF LEFT FEMORAL PSEUDOANEURYSM; REVISION  OF LEFT THIGH ARTERIOVENOUS GRAFT USING 6MM X 10 CM GORETEX GRAFT ;  Surgeon: [name], MD;  Location: [facility] OR;  Service: Vascular;  Laterality: Left;   IR AV DIALY SHUNT INTRO NEEDLE/INTRACATH INITIAL W/PTA/IMG RIGHT Right 02/06/2019   IR AV DIALY SHUNT INTRO NEEDLE/INTRACATH INITIAL  W/PTA/IMG RIGHT Right 05/14/2019   IR AV DIALY SHUNT INTRO NEEDLE/INTRACATH INITIAL W/PTA/IMG RIGHT Right 08/18/2020   IR AV DIALY SHUNT INTRO NEEDLE/INTRACATH INITIAL W/PTA/IMG RIGHT Right 04/21/2022   IR DIALY SHUNT INTRO NEEDLE/INTRACATH INITIAL W/IMG RIGHT Right 09/17/2019   IR DIALY SHUNT INTRO NEEDLE/INTRACATH INITIAL W/IMG RIGHT Right 12/10/2019   IR FLUORO GUIDE CV LINE RIGHT  07/10/2021   IR GENERIC HISTORICAL  07/15/2016   IR US GUIDE VASC ACCESS LEFT 07/15/2016 [name], MD [facility]   IR GENERIC HISTORICAL Left 07/15/2016   IR THROMBECTOMY AV FISTULA W/THROMBOLYSIS/PTA INC/SHUNT/IMG LEFT 07/15/2016 [name], MD [facility]   IR GENERIC HISTORICAL  10/05/2016   IR US GUIDE VASC ACCESS LEFT 10/05/2016 [name], MD [facility]   IR GENERIC HISTORICAL Left 10/05/2016   IR THROMBECTOMY AV FISTULA W/THROMBOLYSIS/PTA INC/SHUNT/IMG LEFT 10/05/2016 [name], MD [facility]   IR GENERIC HISTORICAL  10/08/2016   IR FLUORO GUIDE CV LINE RIGHT 10/08/2016 [name], MD [facility]   IR GENERIC HISTORICAL  10/08/2016   IR US GUIDE VASC ACCESS RIGHT 10/08/2016 [name], MD [facility]   IR PTA AND STENT ADDL CENTRAL DIALY SEG THRU DIALY CIRCUIT RIGHT Right 12/10/2019   IR RADIOLOGIST EVAL & MGMT  11/26/2019   IR REMOVAL TUN CV CATH W/O FL  03/16/2017   IR REMOVAL TUN CV CATH W/O FL  07/22/2021   IR THROMBECTOMY AV FISTULA W/THROMBOLYSIS/PTA INC/SHUNT/IMG RIGHT Right 06/16/2018   IR THROMBECTOMY AV FISTULA W/THROMBOLYSIS/PTA INC/SHUNT/IMG RIGHT Right 06/17/2018   IR THROMBECTOMY AV FISTULA W/THROMBOLYSIS/PTA INC/SHUNT/IMG RIGHT Right 07/15/2021   IR US GUIDE VASC ACCESS RIGHT  06/17/2018   IR US GUIDE VASC ACCESS RIGHT  06/16/2018   IR US GUIDE VASC ACCESS RIGHT  02/06/2019   IR US GUIDE VASC ACCESS RIGHT  05/14/2019   IR US GUIDE VASC ACCESS RIGHT  09/17/2019   IR US GUIDE VASC ACCESS RIGHT  12/10/2019   IR US GUIDE VASC ACCESS RIGHT  08/18/2020   IR US GUIDE VASC ACCESS RIGHT   07/10/2021   IR US GUIDE VASC ACCESS RIGHT  07/19/2021   IR US GUIDE VASC ACCESS RIGHT  04/21/2022   PARATHYROIDECTOMY N/A 04/24/2014   Procedure: TOTAL 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;  Surgeon: [name], MD;  Location: [facility];  Service: General;  Laterality: N/A;  NECK AND LEFT FOREARM   PERIPHERAL VASCULAR CATHETERIZATION N/A 05/05/2016   Procedure: A/V Shuntogram;  Surgeon: Conrad Bogue Chitto, MD;  Location: [place] CV LAB;  Service: Cardiovascular;  Laterality: N/A;   PERIPHERAL VASCULAR CATHETERIZATION Left 05/05/2016   Procedure: Peripheral Vascular Balloon Angioplasty;  Surgeon: Conrad West Freehold, MD;  Location: [place] CV LAB;  Service: Cardiovascular;  Laterality: Left;   PSEUDOANEURYSM REPAIR Left 11/09/2015   TEE WITHOUT CARDIOVERSION N/A 02/19/2021   Procedure: TRANSESOPHAGEAL ECHOCARDIOGRAM (TEE);  Surgeon: [name], MD;  Location: [facility] ENDOSCOPY;  Service: Cardiovascular;  Laterality: N/A;   THROMBECTOMY / ARTERIOVENOUS GRAFT REVISION Left 08/18/2015   thigh   THROMBECTOMY AND REVISION OF ARTERIOVENTOUS (AV) GORETEX  GRAFT Left 08/23/2015   Procedure: THROMBECTOMY AND REVISION OF ARTERIOVENTOUS (AV) GORETEX  GRAFT LEFT THIGH ;  Surgeon: [name], MD;  Location: [place];  Service: Vascular;  Laterality: Left;   THROMBECTOMY W/ EMBOLECTOMY Left 08/18/2015   Procedure: THROMBECTOMY  AND REVISION ARTERIOVENOUS GORE-TEX GRAFT/LEFT THIGH;  Surgeon: [name], MD;  Location: [place];  Service: Vascular;  Laterality: Left;   THROMBECTOMY W/ EMBOLECTOMY Right 06/19/2018   Procedure: THROMBECTOMY AND REVISION OF RIGHT THIGH  ARTERIOVENOUS GORE-TEX GRAFT;  Surgeon: [name], MD;  Location: [facility] OR;  Service: Vascular;  Laterality: Right;   UPPER EXTREMITY VENOGRAPHY Bilateral 12/27/2016   Procedure: Upper Extremity Venography;  Surgeon: [name], MD;  Location: [place] CV LAB;  Service: Cardiovascular;  Laterality: Bilateral;   VENOGRAM  Left 05/05/2016   Procedure: Venogram;  Surgeon: Conrad Kirwin, MD;  Location: [place] CV LAB;  Service: Cardiovascular;  Laterality: Left;  lower extremity    Current Medications: Outpatient Medications Prior to Visit  Medication Sig Dispense Refill   acetaminophen (TYLENOL) 500 MG tablet Take 1,000 mg by mouth daily as needed (for pain or headaches).      amLODipine (NORVASC) 10 MG tablet Take 10 mg by mouth daily at 2 PM.     B Complex-C-Folic Acid (DIALYVITE TABLET) TABS Take 1 tablet by mouth daily.     TUMS ULTRA 1000 1000 MG chewable tablet Chew 4,000 mg by mouth 3 (three) times daily with meals.     cloNIDine (CATAPRES) 0.2 MG tablet Take 0.5 tablets (0.1 mg total) by mouth daily. (Patient taking differently: Take 0.1 mg by mouth 2 (two) times daily.)     diphenhydrAMINE (BENADRYL) 50 MG tablet Take 1 tablet (50 mg total) by mouth once for 1 dose. Take 50 mg 1 hour before your procedure. 1 tablet 0   lidocaine-prilocaine (EMLA) cream Apply 1 application topically daily as needed (port access).      predniSONE (DELTASONE) 50 MG tablet Take 50 mg Prednisone 13 hours, 7 hours, and 1 hour before your procedure. 3 tablet 0   Facility-Administered Medications Prior to Visit  Medication Dose Route Frequency Provider Last Rate Last Admin   0.9 %  sodium chloride infusion   Intravenous Continuous Monia Sabal, PA-C   New Bag at 02/19/21 1347     Allergies:   Ivp dye [iodinated contrast media], Lisinopril, and Solu-medrol [methylprednisolone]   Social History   Socioeconomic History   Marital status: Married    Spouse name: Not on file   Number of children: Not on file   Years of education: Not on file   Highest education level: Not on file  Occupational History   Not on file  Tobacco Use   Smoking status: Former    Types: Cigarettes    Quit date: 03/21/1986    Years since quitting: 36.1   Smokeless tobacco: Never  Vaping Use   Vaping Use: Never used  Substance and Sexual  Activity   Alcohol use: No    Alcohol/week: 0.0 standard drinks of alcohol   Drug use: No   Sexual activity: Not on file  Other Topics Concern   Not on file  Social History Narrative   Not on file   Social Determinants of Health   Financial Resource Strain: Not on file  Food Insecurity: Not on file  Transportation Needs: Not on file  Physical Activity: Not on file  Stress: Not on file  Social Connections: Not on file    Socially he was born in [place], [place].  He is married for 8 years but recently separated.  He has 5 children.  He lives with a roommate.  He works at [employer].  He completed 12 grade of education.  He smoked for short-term and quit in 1987.  There is no illicit drugs.  He does not drink alcohol.  He does not exercise.  Family History: Mother is alive at age 33 and has hypertension and diabetes.  His father died at 53 of unknown cause.  He has 10 brothers and sisters.  ROS General: Negative; No fevers, chills, or night sweats;  HEENT: Negative; No changes in vision or hearing, sinus congestion, difficulty swallowing Pulmonary: Negative; No cough, wheezing, shortness of breath, hemoptysis Cardiovascular: See HPI GI: Negative; No nausea, vomiting, diarrhea, or abdominal pain GU: End-stage renal disease on dialysis since age 22 Musculoskeletal: Negative; no myalgias, joint pain, or weakness Hematologic/Oncology: Negative; no easy bruising, bleeding Endocrine: Negative; no heat/cold intolerance; no diabetes Neuro: Negative; no changes in balance, headaches Skin: Negative; No rashes or skin lesions Psychiatric: Negative; No behavioral problems, depression Sleep: Negative; No snoring, daytime sleepiness, hypersomnolence, bruxism, restless legs, hypnogognic hallucinations, no cataplexy Other comprehensive 14 point system review is negative.   PHYSICAL EXAM:   VS:  BP (!) 172/90   Pulse 79   Ht 6' (1.829 m)   Wt 148 lb 12.8 oz (67.5 kg)   SpO2 98%   BMI  20.18 kg/m     Repeat blood pressure by me was elevated at 174/90.  He did not take his medicines this morning.  Wt Readings from Last 3 Encounters:  05/04/22 148 lb 12.8 oz (67.5 kg)  04/21/22 147 lb 11.3 oz (67 kg)  03/28/22 149 lb 14.6 oz (68 kg)    General: Alert, oriented, no distress.  Skin: normal turgor, no rashes, warm and dry HEENT: Normocephalic, atraumatic. Pupils equal round and reactive to light; sclera anicteric; extraocular muscles intact;  Nose without nasal septal hypertrophy Mouth/Parynx benign; Mallinpatti scale 3 Neck: No JVD, no carotid bruits; normal carotid upstroke Lungs: clear to ausculatation and percussion; no wheezing or rales Chest wall: without tenderness to palpitation Heart: PMI not displaced, RRR, s1 s2 normal, 2/6 systolic murmur, no diastolic murmur, no rubs, gallops, thrills, or heaves Abdomen: To and fro bruit from his fistulas; soft, nontender; no hepatosplenomehaly, BS+; abdominal aorta nontender and not dilated by palpation. Back: Old left arm fistula, nonfunctional.  Present right thigh fistula. Musculoskeletal: full range of motion, normal strength, no joint deformities Extremities: no clubbing cyanosis or edema, Homan's sign negative  Neurologic: grossly nonfocal; Cranial nerves grossly wnl Psychologic: Normal mood and affect   Studies/Labs Reviewed:   May 04, 2022 ECG (independently read by me): NSR at 79, LVH, biatrial enlargement  Recent Labs:    Latest Ref Rng & Units 03/28/2022    8:28 PM 03/26/2022    6:06 PM 03/26/2022    5:55 PM  BMP  Glucose 70 - 99 mg/dL 87  86  87   BUN 6 - 20 mg/dL _0 Creatinine 0.61 - 1.24 mg/dL 9.08  13.60  12.35   Sodium 135 - 145 mmol/L 141  140  140   Potassium 3.5 - 5.1 mmol/L 4.0  3.5  3.6  Chloride 98 - 111 mmol/L 97  101  99   CO2 22 - 32 mmol/L 32   25   Calcium 8.9 - 10.3 mg/dL 8.9   10.0         Latest Ref Rng & Units 03/26/2022    5:55 PM 07/11/2021    5:55 AM 05/27/2021     7:20 AM  Hepatic Function  Total Protein 6.5 - 8.1 g/dL 7.6  8.5    Albumin 3.5 - 5.0 g/dL 3.8  3.3  3.9   AST 15 - 41 U/L 23  18    ALT 0 - 44 U/L 16  10    Alk Phosphatase 38 - 126 U/L 50  71    Total Bilirubin 0.3 - 1.2 mg/dL 0.8  0.5         Latest Ref Rng & Units 03/28/2022    8:28 PM 03/26/2022    6:06 PM 03/26/2022    5:55 PM  CBC  WBC 4.0 - 10.5 K/uL 3.9   4.2   Hemoglobin 13.0 - 17.0 g/dL 9.9  10.5  10.0   Hematocrit 39.0 - 52.0 % 30.7  31.0  29.7   Platelets 150 - 400 K/uL 181   167    Lab Results  Component Value Date   MCV 95.0 03/28/2022   MCV 93.7 03/26/2022   MCV 95.9 11/13/2021   No results found for: "TSH" No results found for: "HGBA1C"   BNP No results found for: "BNP"  ProBNP No results found for: "PROBNP"   Lipid Panel  No results found for: "CHOL", "TRIG", "HDL", "CHOLHDL", "VLDL", "LDLCALC", "LDLDIRECT", "LABVLDL"   RADIOLOGY: IR US Guide Vasc Access Right  Result Date: 04/21/2022 CLINICAL DATA:  End-stage renal disease with hemodialysis administered via an indwelling right thigh AV dialysis graft. There is increased bleeding after access of the graft for hemodialysis which has been associated with venous outflow stenosis in the past. EXAM: 1. DIALYSIS AV SHUNTOGRAM 2. ANGIOPLASTY OF DIALYSIS GRAFT VENOUS OUTFLOW COMPARISON:  Dialysis graft declot procedure on 07/15/2021 CONTRAST:  30 mL Omnipaque 300 FLUOROSCOPY TIME:  4 minutes and 42 seconds.  30.0 mGy. MEDICATIONS: Moderate (conscious) sedation was employed during this procedure. A total of Versed 1.0 mg and Fentanyl 50 mcg was administered intravenously by radiology nursing. Moderate Sedation Time: 33 minutes. The patient's level of consciousness and vital signs were monitored continuously by radiology nursing throughout the procedure under my direct supervision. PROCEDURE: The procedure, risks, benefits, and alternatives were explained to the patient. Questions regarding the procedure were  encouraged and answered. The patient understands and consents to the procedure. A time-out was performed prior to initiating the procedure. The right thigh dialysis graft was prepped with chlorhexidine in a sterile fashion, and a sterile drape was applied covering the operative field. A diagnostic shunt study was performed via a micropuncture dilator introduced into venous outflow. Venous drainage was assessed to the level of the central veins in the chest. Proximal fistula was studied by reflux maneuver with temporary compression of venous outflow. The dilator was removed and replaced with a 7-French sheath over a guidewire. Balloon angioplasty of the proximal common femoral vein and external iliac vein were then performed with a 10 mm x 4 cm Conquest balloon. Balloon angioplasty was followed by additional angiography. The sheath was removed and hemostasis obtained with application of a 2-0 Ethilon pursestring suture. COMPLICATIONS: None FINDINGS: The graft study demonstrates patent pseudoaneurysms at the apex of the graft. The graft itself  is normally patent. Significant narrowing of venous outflow is seen beginning at the level of the proximal common femoral vein along the proximal aspect of the femoral head. Stenosis in this region is approximately 80-90% in caliber. Contiguous stenosis extends into the proximal aspect in extended outflow venous stent across the entire external iliac vein extending into the right common iliac vein. Stenosis within the distal aspect of the stent is estimated to be approximately 75% in caliber. There also are mild areas of intra stent stenosis present including roughly 30-40% stenosis in the midportion of the stent. IVC outflow is normally patent. After balloon angioplasty of the proximal common femoral venous stenosis and intrastent external iliac vein stenosis, there is significant improved appearance by angiography. No significant intrastent stenosis remains. Stenosis of the  proximal common femoral vein demonstrated significant improvement with reduction to approximately 40-50% caliber. 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 Areas were treated successfully with 10 mm balloon angioplasty. ACCESS: The graft and venous outflow remain amenable to percutaneous intervention. Electronically Signed   By: Aletta Edouard M.D.   On: 04/21/2022 16:31   IR AV DIALY SHUNT INTRO NEEDLE/INTRACATH INITIAL W/PTA/IMG RIGHT  Result Date: 04/21/2022 CLINICAL DATA:  End-stage renal disease with hemodialysis administered via an indwelling right thigh AV dialysis graft. There is increased bleeding after access of the graft for hemodialysis which has been associated with venous outflow stenosis in the past. EXAM: 1. DIALYSIS AV SHUNTOGRAM 2. ANGIOPLASTY OF DIALYSIS GRAFT VENOUS OUTFLOW COMPARISON:  Dialysis graft declot procedure on 07/15/2021 CONTRAST:  30 mL Omnipaque 300 FLUOROSCOPY TIME:  4 minutes and 42 seconds.  30.0 mGy. MEDICATIONS: Moderate (conscious) sedation was employed during this procedure. A total of Versed 1.0 mg and Fentanyl 50 mcg was administered intravenously by radiology nursing. Moderate Sedation Time: 33 minutes. The patient's level of consciousness and vital signs were monitored continuously by radiology nursing throughout the procedure under my direct supervision. PROCEDURE: The procedure, risks, benefits, and alternatives were explained to the patient. Questions regarding the procedure were encouraged and answered. The patient understands and consents to the procedure. A time-out was performed prior to initiating the procedure. The right thigh dialysis graft was prepped with chlorhexidine in a sterile fashion, and a sterile drape was applied covering the operative field. A diagnostic shunt study was performed via a micropuncture dilator introduced  into venous outflow. Venous drainage was assessed to the level of the central veins in the chest. Proximal fistula was studied by reflux maneuver with temporary compression of venous outflow. The dilator was removed and replaced with a 7-French sheath over a guidewire. Balloon angioplasty of the proximal common femoral vein and external iliac vein were then performed with a 10 mm x 4 cm Conquest balloon. Balloon angioplasty was followed by additional angiography. The sheath was removed and hemostasis obtained with application of a 2-0 Ethilon pursestring suture. COMPLICATIONS: None FINDINGS: The graft study demonstrates patent pseudoaneurysms at the apex of the graft. The graft itself is normally patent. Significant narrowing of venous outflow is seen beginning at the level of the proximal common femoral vein along the proximal aspect of the femoral head. Stenosis in this region is approximately 80-90% in caliber. Contiguous stenosis extends into the proximal aspect in extended outflow venous stent across the entire external iliac vein extending into the right common iliac vein. Stenosis within the distal aspect of the stent is estimated  to be approximately 75% in caliber. There also are mild areas of intra stent stenosis present including roughly 30-40% stenosis in the midportion of the stent. IVC outflow is normally patent. After balloon angioplasty of the proximal common femoral venous stenosis and intrastent external iliac vein stenosis, there is significant improved appearance by angiography. No significant intrastent stenosis remains. Stenosis of the proximal common femoral vein demonstrated significant improvement with reduction to approximately 40-50% caliber. 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 Areas were treated successfully with 10 mm balloon angioplasty. ACCESS: The graft  and venous outflow remain amenable to percutaneous intervention. Electronically Signed   By: Aletta Edouard M.D.   On: 04/21/2022 16:31     Additional studies/ records that were reviewed today include:  I have reviewed the patient's hospitalization records.  His May 2022 transthoracic and transesophageal echoes were reviewed as noted above.   ASSESSMENT:    1. Precordial pain   2. Essential hypertension   3. Mitral valve prolapse   4. Nonrheumatic mitral valve regurgitation   5. Nonrheumatic tricuspid valve regurgitation   6. ESRD (end stage renal disease) on dialysis (HCC)   7. Palpitations   8. Grade II diastolic dysfunction   9. Medication management     PLAN:  [name] is a 51 year old male who was born in [place].  He has been in [place] since age 21.  He has a longstanding history of hypertension and developed end-stage renal disease commencing dialysis at age 17.  He has had issues with his AV fistulas and currently has a right thigh fistula.  He has had issues with hypertension and most recently is on amlodipine 10 mg and has a prescription for clonidine which she takes 0.1 mg at bedtime.  He has had several admissions in the hospital which I have reviewed.  Presently he admits to his heart racing at times associated with mild shortness of breath.  He denies any anginal type symptomatology but has left lateral chest wall ache which is nonexertional.  His blood pressure today is elevated and a repeat by me was 174/90 but he had not taken his amlodipine this morning.  I reviewed his prior transthoracic and transesophageal echo cardiographic evaluations.  I am recommending he undergo a follow-up 2D echo Doppler study for reassessment of his systolic and diastolic function as well as previous valvular issues.  I have recommended the addition of low-dose metoprolol succinate 25 mg to at least take on nondialysis days and depending upon blood pressure and heart  rate may need to be taken on days of dialysis if blood pressure is not low.  I have recommended a follow-up comprehensive metabolic panel, CBC, TSH and lipid studies.  With his significant cardiovascular comorbidities, I am recommending he undergo coronary CTA evaluation to assess for coronary calcification and intraluminal CAD.  Due to possible contrast allergy he will be premedicated with Benadryl and prednisone.  I will see him in follow-up of the above studies in 6 to 8 weeks for further evaluation or sooner as needed.  Medication Adjustments/Labs and Tests Ordered: Current medicines are reviewed at length with the patient today.  Concerns regarding medicines are outlined above.  Medication changes, Labs and Tests ordered today are listed in the Patient Instructions below. Patient Instructions  Medication Instructions:  START: Metoprolol Succinate 25 mg daily (Hold the morning of dialysis) DECREASE: Clonidine  to 0.1 mg daily at bedtime  *If you need a refill on your cardiac medications before your next appointment, please call your pharmacy*   Lab Work: Your physician recommends that you return for lab work ( fasting lipid, CMP, CBC, TSH)  If you have labs (blood work) drawn today and your tests are completely normal, you will receive your results only by: [place] (if you have MyChart) OR A paper copy in the mail If you have any lab test that is abnormal or we need to change your treatment, we will call you to review the results.   Testing/Procedures: Your physician has requested that you have an echocardiogram. Echocardiography is a painless test that uses sound waves to create images of your heart. It provides your doctor with information about the size and shape of your heart and how well your heart's chambers and valves are working. This procedure takes approximately one hour. There are no restrictions for this procedure. [place] [address]  Cardiac CT  Angiography (CTA), is a special type of CT scan that uses a computer to produce multi-dimensional views of major blood vessels throughout the body. In CT angiography, a contrast material is injected through an IV to help visualize the blood vessels [facility]    Follow-Up: At [facility], you and your health needs are our priority.  As part of our continuing mission to provide you with exceptional heart care, we have created designated Provider Care Teams.  These Care Teams include your primary Cardiologist (physician) and Advanced Practice Providers (APPs -  Physician Assistants and Nurse Practitioners) who all work together to provide you with the care you need, when you need it.  We recommend signing up for the patient portal called "MyChart".  Sign up information is provided on this After Visit Summary.  MyChart is used to connect with patients for Virtual Visits (Telemedicine).  Patients are able to view lab/test results, encounter notes, upcoming appointments, etc.  Non-urgent messages can be sent to your provider as well.   To learn more about what you can do with MyChart, go to [email or web address].    Your next appointment:   6 -8 week(s)  The format for your next appointment:   In Person  Provider:   Shelva Majestic, MD    Your cardiac CT will be scheduled at one of the below locations:   [facility] [address] [place], [place] [address] [phone]  If scheduled at [facility], please arrive at the [facility] and Children's Entrance (Entrance C2) of [facility] 30 minutes prior to test start time. You can use the FREE valet parking offered at entrance C (encouraged to control the heart rate for the test)  Proceed to the [facility] Radiology Department (first floor) to check-in and test prep.  All radiology patients and guests should use entrance C2 at [facility], accessed from [facility], even though the  hospital's physical address listed is [address].    If scheduled at [facility], please arrive 15 mins early for check-in and test prep.  Please follow these instructions carefully (unless otherwise directed):  Hold all erectile dysfunction medications at least 3 days (72 hrs) prior to test.  On the Night Before the Test: Be sure to Drink plenty of water. Do not consume any caffeinated/decaffeinated beverages or chocolate 12 hours prior to your test. Do not take any antihistamines 12 hours prior to  your test. If the patient has contrast allergy: Patient will need a prescription for Prednisone and very clear instructions (as follows): Prednisone 50 mg - take 13 hours prior to test Take another Prednisone 50 mg 7 hours prior to test Take another Prednisone 50 mg 1 hour prior to test Take Benadryl 50 mg 1 hour prior to test Patient must complete all four doses of above prophylactic medications. Patient will need a ride after test due to Benadryl.  On the Day of the Test: Drink plenty of water until 1 hour prior to the test. Do not eat any food 4 hours prior to the test. You may take your regular medications prior to the test.  Take metoprolol succinate 25 mg two hours prior to test.      After the Test: Drink plenty of water. After receiving IV contrast, you may experience a mild flushed feeling. This is normal. On occasion, you may experience a mild rash up to 24 hours after the test. This is not dangerous. If this occurs, you can take Benadryl 25 mg and increase your fluid intake. If you experience trouble breathing, this can be serious. If it is severe call 911 IMMEDIATELY. If it is mild, please call our office. If you take any of these medications: Glipizide/Metformin, Avandament, Glucavance, please do not take 48 hours after completing test unless otherwise instructed.  We will call to schedule your test 2-4 weeks out understanding that  some insurance companies will need an authorization prior to the service being performed.   For non-scheduling related questions, please contact the cardiac imaging nurse navigator should you have any questions/concerns: Marchia Bond, Cardiac Imaging Nurse Navigator Gordy Clement, Cardiac Imaging Nurse Navigator [facility] Direct Office Dial: [phone]   For scheduling needs, including cancellations and rescheduling, please call [place], [phone].        Signed, Shelva Majestic, MD  05/09/2022 11:14 AM    [facility] [address], [place], [place], [place]  [address] Phone: [phone]

## 2022-05-04 NOTE — Patient Instructions (Addendum)
Medication Instructions:  START: Metoprolol Succinate 25 mg daily (Hold the morning of dialysis) DECREASE: Clonidine to 0.1 mg daily at bedtime  *If you need a refill on your cardiac medications before your next appointment, please call your pharmacy*   Lab Work: Your physician recommends that you return for lab work ( fasting lipid, CMP, CBC, TSH)  If you have labs (blood work) drawn today and your tests are completely normal, you will receive your results only by: [place] (if you have MyChart) OR A paper copy in the mail If you have any lab test that is abnormal or we need to change your treatment, we will call you to review the results.   Testing/Procedures: Your physician has requested that you have an echocardiogram. Echocardiography is a painless test that uses sound waves to create images of your heart. It provides your doctor with information about the size and shape of your heart and how well your heart's chambers and valves are working. This procedure takes approximately one hour. There are no restrictions for this procedure. [place] [address]  Cardiac CT Angiography (CTA), is a special type of CT scan that uses a computer to produce multi-dimensional views of major blood vessels throughout the body. In CT angiography, a contrast material is injected through an IV to help visualize the blood vessels [facility]    Follow-Up: At [facility], you and your health needs are our priority.  As part of our continuing mission to provide you with exceptional heart care, we have created designated Provider Care Teams.  These Care Teams include your primary Cardiologist (physician) and Advanced Practice Providers (APPs -  Physician Assistants and Nurse Practitioners) who all work together to provide you with the care you need, when you need it.  We recommend signing up for the patient portal called "MyChart".  Sign up information is provided on this After  Visit Summary.  MyChart is used to connect with patients for Virtual Visits (Telemedicine).  Patients are able to view lab/test results, encounter notes, upcoming appointments, etc.  Non-urgent messages can be sent to your provider as well.   To learn more about what you can do with MyChart, go to [email or web address].    Your next appointment:   6 -8 week(s)  The format for your next appointment:   In Person  Provider:   Shelva Majestic, MD    Your cardiac CT will be scheduled at one of the below locations:   [facility] [address] [place], [place] [address] [phone]  If scheduled at [facility], please arrive at the [facility] and Children's Entrance (Entrance C2) of [facility] 30 minutes prior to test start time. You can use the FREE valet parking offered at entrance C (encouraged to control the heart rate for the test)  Proceed to the [facility] Radiology Department (first floor) to check-in and test prep.  All radiology patients and guests should use entrance C2 at [facility], accessed from [facility], even though the hospital's physical address listed is [address]    If scheduled at [facility], please arrive 15 mins early for check-in and test prep.  Please follow these instructions carefully (unless otherwise directed):  Hold all erectile dysfunction medications at least 3 days (72 hrs) prior to test.  On the Night Before the Test: Be sure to Drink plenty of water. Do not consume any caffeinated/decaffeinated beverages or chocolate  12 hours prior to your test. Do not take any antihistamines 12 hours prior to your test. If the patient has contrast allergy: Patient will need a prescription for Prednisone and very clear instructions (as follows): Prednisone 50 mg - take 13 hours prior to test Take another Prednisone 50 mg 7 hours prior to test Take another Prednisone 50 mg  1 hour prior to test Take Benadryl 50 mg 1 hour prior to test Patient must complete all four doses of above prophylactic medications. Patient will need a ride after test due to Benadryl.  On the Day of the Test: Drink plenty of water until 1 hour prior to the test. Do not eat any food 4 hours prior to the test. You may take your regular medications prior to the test.  Take metoprolol succinate 25 mg two hours prior to test.      After the Test: Drink plenty of water. After receiving IV contrast, you may experience a mild flushed feeling. This is normal. On occasion, you may experience a mild rash up to 24 hours after the test. This is not dangerous. If this occurs, you can take Benadryl 25 mg and increase your fluid intake. If you experience trouble breathing, this can be serious. If it is severe call 911 IMMEDIATELY. If it is mild, please call our office. If you take any of these medications: Glipizide/Metformin, Avandament, Glucavance, please do not take 48 hours after completing test unless otherwise instructed.  We will call to schedule your test 2-4 weeks out understanding that some insurance companies will need an authorization prior to the service being performed.   For non-scheduling related questions, please contact the cardiac imaging nurse navigator should you have any questions/concerns: Marchia Bond, Cardiac Imaging Nurse Navigator Gordy Clement, Cardiac Imaging Nurse Navigator [place] [facility] Direct Office Dial: [phone]   For scheduling needs, including cancellations and rescheduling, please call [place], [phone].

## 2022-05-05 ENCOUNTER — Telehealth: Payer: Self-pay | Admitting: Licensed Clinical Social Worker

## 2022-05-05 NOTE — Telephone Encounter (Signed)
H&V Care Navigation CSW Progress Note  Clinical Social Worker contacted patient by phone to f/u after appt on 8/1, pt has missed calls per account from [place]. No noted PCP, and no listed insurance. No answer at [phone], voicemail left requesting call back.    Patient is participating in a Managed Medicaid Plan:  No, self pay only.  SDOH Screenings   Alcohol Screen: Not on file  Depression (PHQ2-9): Low Risk  (03/04/2021)   Depression (PHQ2-9)    PHQ-2 Score: 0  Financial Resource Strain: Not on file  Food Insecurity: Not on file  Housing: Not on file  Physical Activity: Not on file  Social Connections: Not on file  Stress: Not on file  Tobacco Use: Medium Risk (05/04/2022)   Patient History    Smoking Tobacco Use: Former    Smokeless Tobacco Use: Never    Passive Exposure: Not on file  Transportation Needs: Not on file   Westley Hummer, MSW, LCSW Clinical Social Worker II [place]  [phone]- work cell phone (preferred) [phone]- desk phone

## 2022-05-06 ENCOUNTER — Telehealth: Payer: Self-pay | Admitting: Licensed Clinical Social Worker

## 2022-05-06 NOTE — Telephone Encounter (Signed)
H&V Care Navigation CSW Progress Note  Clinical Social Worker contacted patient by phone to f/u after appt on 8/1, pt has missed calls per account from [place]. No noted PCP, and no listed insurance. No answer at [phone], voicemail left requesting call back.     Patient is participating in a Managed Medicaid Plan:  No, self pay only.  SDOH Screenings   Alcohol Screen: Not on file  Depression (PHQ2-9): Low Risk  (03/04/2021)   Depression (PHQ2-9)    PHQ-2 Score: 0  Financial Resource Strain: Not on file  Food Insecurity: Not on file  Housing: Not on file  Physical Activity: Not on file  Social Connections: Not on file  Stress: Not on file  Tobacco Use: Medium Risk (05/04/2022)   Patient History    Smoking Tobacco Use: Former    Smokeless Tobacco Use: Never    Passive Exposure: Not on file  Transportation Needs: Not on file   Westley Hummer, MSW, LCSW Clinical Social Worker II [place]  [phone]- work cell phone (preferred) [phone]- desk phone

## 2022-05-09 ENCOUNTER — Encounter: Payer: Self-pay | Admitting: Cardiovascular Disease

## 2022-05-09 ENCOUNTER — Telehealth: Payer: Self-pay | Admitting: Licensed Clinical Social Worker

## 2022-05-09 NOTE — Telephone Encounter (Signed)
H&V Care Navigation CSW Progress Note  Clinical Social Worker contacted patient by phone to f/u after appt on 8/1, pt has missed calls per account from [place]. No noted PCP, and no listed insurance. No answer at [phone] for a third time, voicemail left requesting call back.  I will not actively follow at this time, I have left 3 voicemails and no other number listed on DPR. Remain available should pt return my calls/be interested in engaging in care navigation support.    Patient is participating in a Managed Medicaid Plan:  No, self pay only.  SDOH Screenings   Alcohol Screen: Not on file  Depression (PHQ2-9): Low Risk  (03/04/2021)   Depression (PHQ2-9)    PHQ-2 Score: 0  Financial Resource Strain: Not on file  Food Insecurity: Not on file  Housing: Not on file  Physical Activity: Not on file  Social Connections: Not on file  Stress: Not on file  Tobacco Use: Medium Risk (05/09/2022)   Patient History    Smoking Tobacco Use: Former    Smokeless Tobacco Use: Never    Passive Exposure: Not on file  Transportation Needs: Not on file   Westley Hummer, MSW, LCSW Clinical Social Worker II [place]  [phone]- work cell phone (preferred) [phone]- desk phone

## 2022-05-20 ENCOUNTER — Ambulatory Visit (HOSPITAL_COMMUNITY): Payer: Self-pay | Attending: Cardiology

## 2022-05-20 DIAGNOSIS — I340 Nonrheumatic mitral (valve) insufficiency: Secondary | ICD-10-CM | POA: Insufficient documentation

## 2022-05-21 LAB — ECHOCARDIOGRAM COMPLETE
Area-P 1/2: 2.99 cm2
P 1/2 time: 410 msec
S' Lateral: 3.8 cm

## 2022-06-07 ENCOUNTER — Encounter (HOSPITAL_COMMUNITY): Payer: Self-pay

## 2022-06-11 ENCOUNTER — Emergency Department (HOSPITAL_COMMUNITY)
Admission: EM | Admit: 2022-06-11 | Discharge: 2022-06-12 | Disposition: A | Discharge status: Home / Self Care | Payer: Self-pay | Attending: Emergency Medicine | Admitting: Emergency Medicine

## 2022-06-11 ENCOUNTER — Encounter (HOSPITAL_COMMUNITY): Payer: Self-pay

## 2022-06-11 ENCOUNTER — Other Ambulatory Visit: Payer: Self-pay

## 2022-06-11 DIAGNOSIS — R42 Dizziness and giddiness: Secondary | ICD-10-CM | POA: Insufficient documentation

## 2022-06-11 DIAGNOSIS — Z79899 Other long term (current) drug therapy: Secondary | ICD-10-CM | POA: Insufficient documentation

## 2022-06-11 DIAGNOSIS — R519 Headache, unspecified: Secondary | ICD-10-CM | POA: Insufficient documentation

## 2022-06-11 DIAGNOSIS — R079 Chest pain, unspecified: Secondary | ICD-10-CM | POA: Insufficient documentation

## 2022-06-11 DIAGNOSIS — G629 Polyneuropathy, unspecified: Secondary | ICD-10-CM

## 2022-06-11 DIAGNOSIS — I10 Essential (primary) hypertension: Secondary | ICD-10-CM | POA: Insufficient documentation

## 2022-06-11 LAB — BASIC METABOLIC PANEL
Anion gap: 17 — ABNORMAL HIGH (ref 5–15)
BUN: 40 mg/dL — ABNORMAL HIGH (ref 6–20)
CO2: 27 mmol/L (ref 22–32)
Calcium: 8.7 mg/dL — ABNORMAL LOW (ref 8.9–10.3)
Chloride: 95 mmol/L — ABNORMAL LOW (ref 98–111)
Creatinine, Ser: 12.04 mg/dL — ABNORMAL HIGH (ref 0.61–1.24)
GFR, Estimated: 5 mL/min — ABNORMAL LOW (ref >60)
Glucose, Bld: 125 mg/dL — ABNORMAL HIGH (ref 70–99)
Potassium: 3.9 mmol/L (ref 3.5–5.1)
Sodium: 139 mmol/L (ref 135–145)

## 2022-06-11 LAB — CBC
HCT: 43.1 % (ref 39.0–52.0)
Hemoglobin: 14.3 g/dL (ref 13.0–17.0)
MCH: 31.3 pg (ref 26.0–34.0)
MCHC: 33.2 g/dL (ref 30.0–36.0)
MCV: 94.3 fL (ref 80.0–100.0)
Platelets: 200 10*3/uL (ref 150–400)
RBC: 4.57 MIL/uL (ref 4.22–5.81)
RDW: 14.6 % (ref 11.5–15.5)
WBC: 4.6 10*3/uL (ref 4.0–10.5)
nRBC: 0 % (ref 0.0–0.2)

## 2022-06-11 NOTE — ED Notes (Signed)
Pt was provided with urine specimen cup but stated they are on dialysis and would not be able to provide sample

## 2022-06-11 NOTE — ED Triage Notes (Signed)
Complains of dizziness, chest pain, bilateral feet pain and headache x 2 weeks.

## 2022-06-12 ENCOUNTER — Emergency Department (HOSPITAL_COMMUNITY): Payer: Self-pay

## 2022-06-12 LAB — TROPONIN I (HIGH SENSITIVITY): Troponin I (High Sensitivity): 16 ng/L (ref <18)

## 2022-06-12 NOTE — ED Provider Notes (Signed)
[facility] EMERGENCY DEPARTMENT Provider Note   CSN: [id number] Arrival date & time: 06/11/22  1756     History {Add pertinent medical, surgical, social history, OB history to HPI:1} Chief Complaint  Patient presents with   Dizziness    [name] is a 51 y.o. male.  HPI    51 year old male with a history of ESRD on dialysis Monday Wednesday Friday, hypertension, thyroid disease, anxiety   -Hospitalized in May 2022 streptococcal bacteremia, had undergone a ECHO study at the time which showed EF 55 to 60% and grade 2 diastolic dysfunction, subsequent ECHO 50-55%, no endocarditis  Had previous emergency department visits for dizziness and chest pain. Has been evaluated with chronically elevated troponins and referred to Cardiology with appt on August 2 with Dr. Claiborne Billings  Past Medical History:  Diagnosis Date   Anemia    Anxiety    Depression    ESRD on hemodialysis ([place])    HD [facility] MWF   Hypertension    Thyroid disease      Home Medications Prior to Admission medications   Medication Sig Start Date End Date Taking? Authorizing Provider  acetaminophen (TYLENOL) 500 MG tablet Take 1,000 mg by mouth daily as needed (for pain or headaches).     [provider]  amLODipine (NORVASC) 10 MG tablet Take 10 mg by mouth daily at 2 PM.    [provider]  B Complex-C-Folic Acid (DIALYVITE TABLET) TABS Take 1 tablet by mouth daily. 06/29/20   [provider]  cloNIDine (CATAPRES) 0.1 MG tablet Take 1 tablet (0.1 mg total) by mouth at bedtime. 05/04/22   Troy Sine, MD  diphenhydrAMINE (BENADRYL) 50 MG tablet Take 1 tablet (50 mg total) by mouth once for 1 dose. 1 hour prior to test 05/04/22 05/04/22  Troy Sine, MD  metoprolol succinate (TOPROL-XL) 25 MG 24 hr tablet Take 1 tablet (25 mg total) by mouth daily. Take with or immediately following a meal. 05/04/22 04/29/23  Troy Sine, MD  predniSONE (DELTASONE) 50 MG  tablet Take 1 tablet by mouth 13 hrs prior to test, another tablet 7 hours prior, and last dose 1 hour prior. 05/04/22   Troy Sine, MD  TUMS ULTRA 1000 1000 MG chewable tablet Chew 4,000 mg by mouth 3 (three) times daily with meals. 03/14/20   [provider]  fluticasone (FLONASE) 50 MCG/ACT nasal spray Place 2 sprays into both nostrils daily. Patient not taking: Reported on 06/15/2018 07/25/17 02/20/20  Larene Pickett, PA-C      Allergies    Ivp dye [iodinated contrast media], Lisinopril, and Solu-medrol [methylprednisolone]    Review of Systems   Review of Systems  Physical Exam Updated Vital Signs BP 124/88 (BP Location: Right Arm)   Pulse (!) 54   Temp (!) 97.5 F (36.4 C)   Resp 16   Ht 6' (1.829 m)   Wt 67.1 kg   SpO2 97%   BMI 20.07 kg/m  Physical Exam  ED Results / Procedures / Treatments   Labs (all labs ordered are listed, but only abnormal results are displayed) Labs Reviewed  BASIC METABOLIC PANEL - Abnormal; Notable for the following components:      Result Value   Chloride 95 (*)    Glucose, Bld 125 (*)    BUN 40 (*)    Creatinine, Ser 12.04 (*)    Calcium 8.7 (*)    GFR, Estimated 5 (*)  Anion gap 17 (*)    All other components within normal limits  CBC  URINALYSIS, ROUTINE W REFLEX MICROSCOPIC  TROPONIN I (HIGH SENSITIVITY)    EKG EKG Interpretation  Date/Time:  Saturday June 11 2022 18:06:54 EDT Ventricular Rate:  69 PR Interval:  178 QRS Duration: 88 QT Interval:  408 QTC Calculation: 437 R Axis:   46 Text Interpretation: Normal sinus rhythm Right atrial enlargement Minimal voltage criteria for LVH, may be normal variant ( Sokolow-Lyon Confirmed by Dory Horn) on 06/12/2022 5:58:06 AM  Radiology DG Chest Portable 1 View  Result Date: 06/12/2022 CLINICAL DATA:  Short of breath with dizziness. EXAM: PORTABLE CHEST 1 VIEW COMPARISON:  03/28/2022 FINDINGS: Heart size and mediastinal contours are unremarkable. There  is no pleural effusion or edema identified. No airspace opacities identified. IMPRESSION: No active disease. Electronically Signed   By: Kerby Moors M.D.   On: 06/12/2022 06:41    Procedures Procedures  {Document cardiac monitor, telemetry assessment procedure when appropriate:1}  Medications Ordered in ED Medications - No data to display  ED Course/ Medical Decision Making/ A&P                           Medical Decision Making Amount and/or Complexity of Data Reviewed Labs: ordered.   ***  {Document critical care time when appropriate:1} {Document review of labs and clinical decision tools ie heart score, Chads2Vasc2 etc:1}  {Document your independent review of radiology images, and any outside records:1} {Document your discussion with family members, caretakers, and with consultants:1} {Document social determinants of health affecting pt's care:1} {Document your decision making why or why not admission, treatments were needed:1} Final Clinical Impression(s) / ED Diagnoses Final diagnoses:  None    Rx / DC Orders ED Discharge Orders     None

## 2022-06-15 ENCOUNTER — Encounter (HOSPITAL_COMMUNITY): Payer: Self-pay | Admitting: *Deleted

## 2022-06-17 ENCOUNTER — Other Ambulatory Visit (HOSPITAL_COMMUNITY): Payer: Self-pay | Admitting: Nephrology

## 2022-06-17 DIAGNOSIS — R200 Anesthesia of skin: Secondary | ICD-10-CM

## 2022-06-17 DIAGNOSIS — M79672 Pain in left foot: Secondary | ICD-10-CM

## 2022-06-22 ENCOUNTER — Ambulatory Visit (HOSPITAL_COMMUNITY): Payer: Self-pay

## 2022-06-23 ENCOUNTER — Ambulatory Visit (HOSPITAL_BASED_OUTPATIENT_CLINIC_OR_DEPARTMENT_OTHER)
Admission: RE | Admit: 2022-06-23 | Discharge: 2022-06-23 | Disposition: A | Discharge status: Home / Self Care | Payer: Self-pay | Source: Ambulatory Visit | Attending: Nephrology | Admitting: Nephrology

## 2022-06-23 ENCOUNTER — Ambulatory Visit (HOSPITAL_COMMUNITY)
Admission: RE | Admit: 2022-06-23 | Discharge: 2022-06-23 | Disposition: A | Discharge status: Home / Self Care | Payer: Self-pay | Source: Ambulatory Visit | Attending: Nephrology | Admitting: Nephrology

## 2022-06-23 DIAGNOSIS — R200 Anesthesia of skin: Secondary | ICD-10-CM

## 2022-06-23 DIAGNOSIS — M79672 Pain in left foot: Secondary | ICD-10-CM

## 2022-06-23 NOTE — Progress Notes (Signed)
VASCULAR LAB    Bilateral lower extremity venous duplex has been performed.  See CV proc for preliminary results.   Jerilynn Feldmeier, RVT 06/23/2022, 10:07 AM

## 2022-06-23 NOTE — Progress Notes (Signed)
VASCULAR LAB    ABI has been performed.  See CV proc for preliminary results.   Ozelle Brubacher, RVT 06/23/2022, 10:08 AM

## 2022-06-24 ENCOUNTER — Emergency Department (HOSPITAL_COMMUNITY)
Admission: EM | Admit: 2022-06-24 | Discharge: 2022-06-24 | Disposition: A | Discharge status: Home / Self Care | Payer: Self-pay | Attending: Emergency Medicine | Admitting: Emergency Medicine

## 2022-06-24 ENCOUNTER — Other Ambulatory Visit: Payer: Self-pay

## 2022-06-24 ENCOUNTER — Encounter (HOSPITAL_COMMUNITY): Payer: Self-pay

## 2022-06-24 DIAGNOSIS — Z79899 Other long term (current) drug therapy: Secondary | ICD-10-CM | POA: Insufficient documentation

## 2022-06-24 DIAGNOSIS — T82590A Other mechanical complication of surgically created arteriovenous fistula, initial encounter: Secondary | ICD-10-CM | POA: Insufficient documentation

## 2022-06-24 DIAGNOSIS — Y658 Other specified misadventures during surgical and medical care: Secondary | ICD-10-CM | POA: Insufficient documentation

## 2022-06-24 DIAGNOSIS — Z992 Dependence on renal dialysis: Secondary | ICD-10-CM | POA: Insufficient documentation

## 2022-06-24 DIAGNOSIS — E875 Hyperkalemia: Secondary | ICD-10-CM | POA: Insufficient documentation

## 2022-06-24 DIAGNOSIS — N186 End stage renal disease: Secondary | ICD-10-CM | POA: Insufficient documentation

## 2022-06-24 DIAGNOSIS — T82898A Other specified complication of vascular prosthetic devices, implants and grafts, initial encounter: Secondary | ICD-10-CM

## 2022-06-24 DIAGNOSIS — I120 Hypertensive chronic kidney disease with stage 5 chronic kidney disease or end stage renal disease: Secondary | ICD-10-CM | POA: Insufficient documentation

## 2022-06-24 DIAGNOSIS — E872 Acidosis, unspecified: Secondary | ICD-10-CM | POA: Insufficient documentation

## 2022-06-24 LAB — CBC WITH DIFFERENTIAL/PLATELET
Abs Immature Granulocytes: 0.03 K/uL (ref 0.00–0.07)
Basophils Absolute: 0.1 K/uL (ref 0.0–0.1)
Basophils Relative: 1 %
Eosinophils Absolute: 0.1 K/uL (ref 0.0–0.5)
Eosinophils Relative: 1 %
HCT: 39.7 % (ref 39.0–52.0)
Hemoglobin: 13.3 g/dL (ref 13.0–17.0)
Immature Granulocytes: 0 %
Lymphocytes Relative: 13 %
Lymphs Abs: 1.3 K/uL (ref 0.7–4.0)
MCH: 31.1 pg (ref 26.0–34.0)
MCHC: 33.5 g/dL (ref 30.0–36.0)
MCV: 92.8 fL (ref 80.0–100.0)
Monocytes Absolute: 1 K/uL (ref 0.1–1.0)
Monocytes Relative: 10 %
Neutro Abs: 7.1 K/uL (ref 1.7–7.7)
Neutrophils Relative %: 75 %
Platelets: 181 K/uL (ref 150–400)
RBC: 4.28 MIL/uL (ref 4.22–5.81)
RDW: 14.6 % (ref 11.5–15.5)
WBC: 9.4 K/uL (ref 4.0–10.5)
nRBC: 0 % (ref 0.0–0.2)

## 2022-06-24 LAB — BASIC METABOLIC PANEL
Anion gap: 17 — ABNORMAL HIGH (ref 5–15)
BUN: 66 mg/dL — ABNORMAL HIGH (ref 6–20)
CO2: 24 mmol/L (ref 22–32)
Calcium: 8.2 mg/dL — ABNORMAL LOW (ref 8.9–10.3)
Chloride: 96 mmol/L — ABNORMAL LOW (ref 98–111)
Creatinine, Ser: 15.2 mg/dL — ABNORMAL HIGH (ref 0.61–1.24)
GFR, Estimated: 4 mL/min — ABNORMAL LOW (ref >60)
Glucose, Bld: 92 mg/dL (ref 70–99)
Potassium: 5.2 mmol/L — ABNORMAL HIGH (ref 3.5–5.1)
Sodium: 137 mmol/L (ref 135–145)

## 2022-06-24 MED ORDER — SODIUM ZIRCONIUM CYCLOSILICATE 10 G PO PACK
10.0000 g | PACK | Freq: Once | ORAL | Status: AC
  Administered 2022-06-24: 10 g via ORAL
  Filled 2022-06-24: qty 1

## 2022-06-24 NOTE — ED Notes (Signed)
Pt discharged home. Discharge instructions provided to pt about follow up with IR and Nephrology. Pt verbalized understanding. Pt AOX4. Respirations even and unlabored at room air with no distress noted. Ambulatory to ED lobby with strong and steady gait.

## 2022-06-24 NOTE — ED Provider Notes (Signed)
[facility] EMERGENCY DEPARTMENT Provider Note   CSN: [id number] Arrival date & time: 06/24/22  1236     History  Chief Complaint  Patient presents with   Vascular Access Problem    Mccormick Amit Leece is a 51 y.o. male.  Patient is a 51 year old male with a past medical history of ESRD on MWF HD with a right groin AV fistula senting to the emergency department with concern that his fistula has clotted.  He states that he was planned to go to dialysis today but could no longer feel a thrill to his fistula.  He states that it felt a little tender as well.  He denies any numbness, weakness or pain in his leg.  He otherwise denies any shortness of breath, heart palpitations or lightheadedness.  The history is provided by the patient.       Home Medications Prior to Admission medications   Medication Sig Start Date End Date Taking? Authorizing Provider  acetaminophen (TYLENOL) 500 MG tablet Take 1,000 mg by mouth daily as needed (for pain or headaches).     [provider]  amLODipine (NORVASC) 10 MG tablet Take 10 mg by mouth daily at 2 PM.    [provider]  B Complex-C-Folic Acid (DIALYVITE TABLET) TABS Take 1 tablet by mouth daily. 06/29/20   [provider]  cloNIDine (CATAPRES) 0.1 MG tablet Take 1 tablet (0.1 mg total) by mouth at bedtime. 05/04/22   Troy Sine, MD  diphenhydrAMINE (BENADRYL) 50 MG tablet Take 1 tablet (50 mg total) by mouth once for 1 dose. 1 hour prior to test 05/04/22 05/04/22  Troy Sine, MD  metoprolol succinate (TOPROL-XL) 25 MG 24 hr tablet Take 1 tablet (25 mg total) by mouth daily. Take with or immediately following a meal. 05/04/22 04/29/23  Troy Sine, MD  predniSONE (DELTASONE) 50 MG tablet Take 1 tablet by mouth 13 hrs prior to test, another tablet 7 hours prior, and last dose 1 hour prior. 05/04/22   Troy Sine, MD  TUMS ULTRA 1000 1000 MG chewable tablet Chew 4,000 mg by mouth 3 (three) times  daily with meals. 03/14/20   [provider]  fluticasone (FLONASE) 50 MCG/ACT nasal spray Place 2 sprays into both nostrils daily. Patient not taking: Reported on 06/15/2018 07/25/17 02/20/20  Larene Pickett, PA-C      Allergies    Ivp dye [iodinated contrast media], Lisinopril, and Solu-medrol [methylprednisolone]    Review of Systems   Review of Systems  Physical Exam Updated Vital Signs BP 116/83   Pulse 60   Temp 98.2 F (36.8 C)   Resp 18   Ht 6' (1.829 m)   Wt 67 kg   SpO2 100%   BMI 20.03 kg/m  Physical Exam Vitals and nursing note reviewed.  Constitutional:      Appearance: Normal appearance.  HENT:     Head: Normocephalic and atraumatic.     Nose: Nose normal.     Mouth/Throat:     Mouth: Mucous membranes are moist.     Pharynx: Oropharynx is clear.  Eyes:     Extraocular Movements: Extraocular movements intact.     Conjunctiva/sclera: Conjunctivae normal.  Cardiovascular:     Rate and Rhythm: Normal rate and regular rhythm.     Pulses: Normal pulses.     Heart sounds: Normal heart sounds.  Pulmonary:     Effort: Pulmonary effort is normal.     Breath sounds: Normal  breath sounds.  Abdominal:     General: Abdomen is flat.     Palpations: Abdomen is soft.  Musculoskeletal:        General: Normal range of motion.     Cervical back: Normal range of motion and neck supple.     Right lower leg: No edema.     Left lower leg: No edema.  Skin:    General: Skin is warm and dry.     Comments: Right groin AV fistula without palpable thrill  Neurological:     General: No focal deficit present.     Mental Status: He is alert and oriented to person, place, and time.  Psychiatric:        Mood and Affect: Mood normal.        Behavior: Behavior normal.     ED Results / Procedures / Treatments   Labs (all labs ordered are listed, but only abnormal results are displayed) Labs Reviewed  BASIC METABOLIC PANEL - Abnormal; Notable for the following  components:      Result Value   Potassium 5.2 (*)    Chloride 96 (*)    BUN 66 (*)    Creatinine, Ser 15.20 (*)    Calcium 8.2 (*)    GFR, Estimated 4 (*)    Anion gap 17 (*)    All other components within normal limits  CBC WITH DIFFERENTIAL/PLATELET    EKG None  Radiology VAS [place] ABI WITH/WO TBI  Result Date: 06/23/2022  LOWER EXTREMITY DOPPLER STUDY Patient Name:  Issai Werling Siebel  Date of Exam:   06/23/2022 Medical Rec #: [id number]              Accession #:    [id number] Date of Birth: [birth year]              Patient Gender: M Patient Age:   35 years Exam Location:  [facility] Procedure:      VAS [place] ABI WITH/WO TBI Referring Phys: Lawson Radar --------------------------------------------------------------------------------  Indications: Pain and tingling of feet High Risk Factors: Hypertension. Other Factors: ESRD, neuropathy.  Comparison Study: No prior study Performing Technologist: Sharion Dove RVS  Examination Guidelines: A complete evaluation includes at minimum, Doppler waveform signals and systolic blood pressure reading at the level of bilateral brachial, anterior tibial, and posterior tibial arteries, when vessel segments are accessible. Bilateral testing is considered an integral part of a complete examination. Photoelectric Plethysmograph (PPG) waveforms and toe systolic pressure readings are included as required and additional duplex testing as needed. Limited examinations for reoccurring indications may be performed as noted.  ABI Findings: +---------+------------------+-----+-----------+--------+ Right    Rt Pressure (mmHg)IndexWaveform   Comment  +---------+------------------+-----+-----------+--------+ Brachial 137                    triphasic           +---------+------------------+-----+-----------+--------+ PTA      155               1.13 multiphasic         +---------+------------------+-----+-----------+--------+ DP       156                1.14 multiphasic         +---------+------------------+-----+-----------+--------+ Great Toe60                0.44                     +---------+------------------+-----+-----------+--------+ +---------+------------------+-----+-----------+---------+  Left     Lt Pressure (mmHg)IndexWaveform   Comment   +---------+------------------+-----+-----------+---------+ Brachial                                   Old graft +---------+------------------+-----+-----------+---------+ PTA      151               1.10 multiphasic          +---------+------------------+-----+-----------+---------+ DP       162               1.18 multiphasic          +---------+------------------+-----+-----------+---------+ Great Toe78                0.57                      +---------+------------------+-----+-----------+---------+ +-------+-----------+-----------+------------+------------+ ABI/TBIToday's ABIToday's TBIPrevious ABIPrevious TBI +-------+-----------+-----------+------------+------------+ Right  1.14       0.44                                +-------+-----------+-----------+------------+------------+ Left   1.18       0.57                                +-------+-----------+-----------+------------+------------+   Summary: Right: Resting right ankle-brachial index is within normal range. The right toe-brachial index is abnormal. Left: Resting left ankle-brachial index is within normal range. The left toe-brachial index is abnormal. *See table(s) above for measurements and observations.  Electronically signed by Monica Martinez MD on 06/23/2022 at 12:38:38 PM.    Final    VAS [place] LOWER EXTREMITY VENOUS (DVT)  Result Date: 06/23/2022  Lower Venous DVT Study Patient Name:  RUSHTON EARLY  Date of Exam:   06/23/2022 Medical Rec #: [id number]              Accession #:    [id number] Date of Birth: 1970/11/02              Patient Gender: M Patient Age:   76 years Exam  Location:  [facility] Procedure:      VAS [place] LOWER EXTREMITY VENOUS (DVT) Referring Phys: Lawson Radar --------------------------------------------------------------------------------  Indications: Numbness and tingling of left foot.  Comparison Study: No prior left LEV Performing Technologist: Sharion Dove RVS  Examination Guidelines: A complete evaluation includes B-mode imaging, spectral Doppler, color Doppler, and power Doppler as needed of all accessible portions of each vessel. Bilateral testing is considered an integral part of a complete examination. Limited examinations for reoccurring indications may be performed as noted. The reflux portion of the exam is performed with the patient in reverse Trendelenburg.  +-----+---------------+---------+-----------+----------+--------------+ RIGHTCompressibilityPhasicitySpontaneityPropertiesThrombus Aging +-----+---------------+---------+-----------+----------+--------------+ CFV  Full           Yes      Yes                                 +-----+---------------+---------+-----------+----------+--------------+ Incidental finding: Right lower extremity dialysis access is thrombosed  +---------+---------------+---------+-----------+----------+--------------+ LEFT     CompressibilityPhasicitySpontaneityPropertiesThrombus Aging +---------+---------------+---------+-----------+----------+--------------+ CFV      Full           Yes      Yes                                 +---------+---------------+---------+-----------+----------+--------------+  SFJ      Full                                                        +---------+---------------+---------+-----------+----------+--------------+ FV Prox  Full                                                        +---------+---------------+---------+-----------+----------+--------------+ FV Mid   Full                                                         +---------+---------------+---------+-----------+----------+--------------+ FV DistalFull                                                        +---------+---------------+---------+-----------+----------+--------------+ PFV      Full                                                        +---------+---------------+---------+-----------+----------+--------------+ POP      Full           Yes      Yes                                 +---------+---------------+---------+-----------+----------+--------------+ PTV      Full                                                        +---------+---------------+---------+-----------+----------+--------------+ PERO     Full                                                        +---------+---------------+---------+-----------+----------+--------------+     Summary: RIGHT: - No evidence of common femoral vein obstruction. 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  LEFT: - There is no evidence of deep vein thrombosis in the lower extremity.  *See table(s) above for measurements and observations. Electronically signed by Monica Martinez MD on 06/23/2022 at 12:38:24 PM.    Final     Procedures Procedures    Medications Ordered in ED Medications  sodium zirconium cyclosilicate (LOKELMA) packet 10 g (10 g Oral Given 06/24/22 1859)    ED Course/ Medical Decision Making/ A&P Clinical Course as of 06/24/22 2203  Fri Jun 24, 2022  1638 I spoke with Dr  Suttle from IR who states that due to the patient's contrast dye allergy, he would need contrast prep prior to intervention. He recommends the patient follow up on Monday to schedule the procedure as an outpatient if he is not planning on being admitted for other reasons. [VK]  [id number] I spoke with Dr. Louie Boston of nephrology who reocmmended [facility] 1 time today and that the patient should be ok to wait for HD until after procedure on Monday. He was recommended to have gentle fluid intake over the  weekend and avoid high potassium foods. [VK]  2015 I spoke with Dr Louie Boston who plans to coordinate scheduling procedure and pre-procedure steroid prep for contrast allergy with IR [VK]    Clinical Course User Index [VK] Ottie Glazier, DO                           Medical Decision Making This patient presents to the ED with chief complaint(s) of clotted AV fistula with pertinent past medical history of ESRD which further complicates the presenting complaint. The complaint involves an extensive differential diagnosis and also carries with it a high risk of complications and morbidity.    The differential diagnosis includes clotted AV fistula, electrolyte abnormality from missed dialysis, no evidence of volume overload  Additional history obtained: Additional history obtained from N/A Records reviewed Primary Care Documents  ED Course and Reassessment: Patient was initially evaluated by provider in triage and had labs performed to evaluate for electrolyte abnormality from missed dialysis.  His potassium was 5.2 and he had a mild anion gap of 17.  He does not appear volume overloaded.  The patient's fistula does not have a palpable thrill, concerning for a clot within the fistula.  Plan will be to talk to interventional radiology for possible thrombolysis.  Independent labs interpretation:  The following labs were independently interpreted: mild hyperK 5.2, mild metabolic acidosis with AGAP 17  Independent visualization of imaging: N/A  Consultation: - Consulted or discussed management/test interpretation w/ external professional: IR, Nephrology  Consideration for admission or further workup: Patient has no emergent conditions requiring admission at this time.  He will be discharged with IR and nephrology follow-up to facilitate management of his clotted AV fistula Social Determinants of health: N/A    Risk Prescription drug management.           Final Clinical  Impression(s) / ED Diagnoses Final diagnoses:  Arteriovenous fistula occlusion, initial encounter [facility])    Rx / DC Orders ED Discharge Orders          Ordered    Ambulatory referral to Nephrology        06/24/22 2022              Ottie Glazier, DO 06/24/22 2203

## 2022-06-24 NOTE — Discharge Instructions (Addendum)
You were seen in the emergency department for the clotting of your fistula.  Your labs showed no need for emergent dialysis.  Nephrology and IR will schedule an outpatient IR follow-up next week to manage the clot in your fistula and get to dialysis.  Nephrology recommended a low potassium diet this weekend and limit your amount of fluids.  I have given you the numbers for the nephrology and IR clinics if you have not heard from them by Sunday.  You should return to the emergency department if you develop shortness of breath, you have chest pain, you pass out, or if you have any other new or concerning symptoms.

## 2022-06-24 NOTE — ED Triage Notes (Signed)
Pt arrived POV stating his fistula in his right leg is clotted off and was told to come here that they could not do his dialysis.

## 2022-06-24 NOTE — ED Provider Triage Note (Signed)
Emergency Medicine Provider Triage Evaluation Note  Sachit Gilman , a 51 y.o. male  was evaluated in triage.  Pt complains of vascular access problem onset prior to arrival.  Notes that he goes to dialysis on Monday, Wednesday, Friday.  He last had a full dialysis session completed on Wednesday.  Went to dialysis today however was told to come to the ED due to the clot to his fistula.  Patient denies chest pain, shortness of breath, abdominal pain, nausea, vomiting.  Notes he has pain to the area.  Review of Systems  Positive: Negative:   Physical Exam  BP 105/68   Pulse 71   Temp 97.6 F (36.4 C) (Oral)   Resp 14   Ht 6' (1.829 m)   Wt 67 kg   SpO2 100%   BMI 20.03 kg/m  Gen:   Awake, no distress   Resp:  Normal effort  MSK:   Moves extremities without difficulty  Other:    Medical Decision Making  Medically screening exam initiated at 1:12 PM.  Appropriate orders placed.  Emaad Abdou Mordan was informed that the remainder of the evaluation will be completed by another provider, this initial triage assessment does not replace that evaluation, and the importance of remaining in the ED until their evaluation is complete.  Work-up initiated   Safari Cinque A, PA-C 06/24/22 1313

## 2022-06-27 ENCOUNTER — Inpatient Hospital Stay (HOSPITAL_COMMUNITY)
Admission: EM | Admit: 2022-06-27 | Discharge: 2022-06-30 | DRG: 640 | Disposition: A | Discharge status: Home / Self Care | Payer: Self-pay | Attending: Internal Medicine | Admitting: Internal Medicine

## 2022-06-27 ENCOUNTER — Other Ambulatory Visit: Payer: Self-pay

## 2022-06-27 ENCOUNTER — Encounter (HOSPITAL_COMMUNITY): Payer: Self-pay

## 2022-06-27 ENCOUNTER — Inpatient Hospital Stay (HOSPITAL_COMMUNITY): Payer: Self-pay

## 2022-06-27 ENCOUNTER — Emergency Department (HOSPITAL_COMMUNITY): Payer: Self-pay

## 2022-06-27 DIAGNOSIS — F419 Anxiety disorder, unspecified: Secondary | ICD-10-CM | POA: Diagnosis present

## 2022-06-27 DIAGNOSIS — E079 Disorder of thyroid, unspecified: Secondary | ICD-10-CM | POA: Diagnosis present

## 2022-06-27 DIAGNOSIS — Z87891 Personal history of nicotine dependence: Secondary | ICD-10-CM

## 2022-06-27 DIAGNOSIS — E875 Hyperkalemia: Principal | ICD-10-CM | POA: Diagnosis present

## 2022-06-27 DIAGNOSIS — I953 Hypotension of hemodialysis: Secondary | ICD-10-CM | POA: Diagnosis present

## 2022-06-27 DIAGNOSIS — I10 Essential (primary) hypertension: Secondary | ICD-10-CM | POA: Diagnosis present

## 2022-06-27 DIAGNOSIS — N186 End stage renal disease: Secondary | ICD-10-CM

## 2022-06-27 DIAGNOSIS — D631 Anemia in chronic kidney disease: Secondary | ICD-10-CM | POA: Diagnosis present

## 2022-06-27 DIAGNOSIS — F32A Depression, unspecified: Secondary | ICD-10-CM | POA: Diagnosis present

## 2022-06-27 DIAGNOSIS — E785 Hyperlipidemia, unspecified: Secondary | ICD-10-CM | POA: Diagnosis present

## 2022-06-27 DIAGNOSIS — Z992 Dependence on renal dialysis: Secondary | ICD-10-CM

## 2022-06-27 DIAGNOSIS — I5022 Chronic systolic (congestive) heart failure: Secondary | ICD-10-CM | POA: Diagnosis present

## 2022-06-27 DIAGNOSIS — Z91158 Patient's noncompliance with renal dialysis for other reason: Secondary | ICD-10-CM

## 2022-06-27 DIAGNOSIS — T82510A Breakdown (mechanical) of surgically created arteriovenous fistula, initial encounter: Secondary | ICD-10-CM | POA: Diagnosis present

## 2022-06-27 DIAGNOSIS — Z91041 Radiographic dye allergy status: Secondary | ICD-10-CM

## 2022-06-27 DIAGNOSIS — B1910 Unspecified viral hepatitis B without hepatic coma: Secondary | ICD-10-CM | POA: Diagnosis present

## 2022-06-27 DIAGNOSIS — I132 Hypertensive heart and chronic kidney disease with heart failure and with stage 5 chronic kidney disease, or end stage renal disease: Secondary | ICD-10-CM | POA: Diagnosis present

## 2022-06-27 DIAGNOSIS — Y832 Surgical operation with anastomosis, bypass or graft as the cause of abnormal reaction of the patient, or of later complication, without mention of misadventure at the time of the procedure: Secondary | ICD-10-CM | POA: Diagnosis present

## 2022-06-27 DIAGNOSIS — Z789 Other specified health status: Secondary | ICD-10-CM | POA: Diagnosis present

## 2022-06-27 DIAGNOSIS — Z79899 Other long term (current) drug therapy: Secondary | ICD-10-CM

## 2022-06-27 HISTORY — PX: IR FLUORO GUIDE CV LINE RIGHT: IMG2283

## 2022-06-27 HISTORY — PX: IR US GUIDE VASC ACCESS RIGHT: IMG2390

## 2022-06-27 LAB — CBC WITH DIFFERENTIAL/PLATELET
Abs Immature Granulocytes: 0 10*3/uL (ref 0.00–0.07)
Basophils Absolute: 0 10*3/uL (ref 0.0–0.1)
Basophils Relative: 0 %
Eosinophils Absolute: 0 10*3/uL (ref 0.0–0.5)
Eosinophils Relative: 0 %
HCT: 41.4 % (ref 39.0–52.0)
Hemoglobin: 13.8 g/dL (ref 13.0–17.0)
Lymphocytes Relative: 4 %
Lymphs Abs: 0.2 10*3/uL — ABNORMAL LOW (ref 0.7–4.0)
MCH: 31.3 pg (ref 26.0–34.0)
MCHC: 33.3 g/dL (ref 30.0–36.0)
MCV: 93.9 fL (ref 80.0–100.0)
Monocytes Absolute: 0 10*3/uL — ABNORMAL LOW (ref 0.1–1.0)
Monocytes Relative: 0 %
Neutro Abs: 4 10*3/uL (ref 1.7–7.7)
Neutrophils Relative %: 96 %
Platelets: 229 10*3/uL (ref 150–400)
RBC: 4.41 MIL/uL (ref 4.22–5.81)
RDW: 14.4 % (ref 11.5–15.5)
WBC: 4.2 10*3/uL (ref 4.0–10.5)
nRBC: 0 % (ref 0.0–0.2)
nRBC: 0 /100 WBC

## 2022-06-27 LAB — RENAL FUNCTION PANEL
Albumin: 3.7 g/dL (ref 3.5–5.0)
Anion gap: 30 — ABNORMAL HIGH (ref 5–15)
BUN: 124 mg/dL — ABNORMAL HIGH (ref 6–20)
CO2: 18 mmol/L — ABNORMAL LOW (ref 22–32)
Calcium: 8.2 mg/dL — ABNORMAL LOW (ref 8.9–10.3)
Chloride: 90 mmol/L — ABNORMAL LOW (ref 98–111)
Creatinine, Ser: 24.66 mg/dL — ABNORMAL HIGH (ref 0.61–1.24)
GFR, Estimated: 2 mL/min — ABNORMAL LOW (ref >60)
Glucose, Bld: 110 mg/dL — ABNORMAL HIGH (ref 70–99)
Phosphorus: 6.8 mg/dL — ABNORMAL HIGH (ref 2.5–4.6)
Potassium: 6.1 mmol/L — ABNORMAL HIGH (ref 3.5–5.1)
Sodium: 138 mmol/L (ref 135–145)

## 2022-06-27 LAB — HEPATITIS B SURFACE ANTIBODY,QUALITATIVE: Hep B S Ab: NONREACTIVE

## 2022-06-27 LAB — HIV ANTIBODY (ROUTINE TESTING W REFLEX): HIV Screen 4th Generation wRfx: NONREACTIVE

## 2022-06-27 LAB — HEPATITIS B CORE ANTIBODY, TOTAL: Hep B Core Total Ab: REACTIVE — AB

## 2022-06-27 LAB — HEPATITIS C ANTIBODY: HCV Ab: NONREACTIVE

## 2022-06-27 MED ORDER — CHLORHEXIDINE GLUCONATE CLOTH 2 % EX PADS
6.0000 | MEDICATED_PAD | Freq: Every day | CUTANEOUS | Status: DC
  Administered 2022-06-28 – 2022-06-29 (×2): 6 via TOPICAL

## 2022-06-27 MED ORDER — PENTAFLUOROPROP-TETRAFLUOROETH EX AERO: 1.0000 | INHALATION_SPRAY | CUTANEOUS | Status: DC | PRN

## 2022-06-27 MED ORDER — CALCIUM GLUCONATE 10 % IV SOLN
1.0000 g | Freq: Once | INTRAVENOUS | Status: AC
  Administered 2022-06-27: 1 g via INTRAVENOUS
  Filled 2022-06-27: qty 10

## 2022-06-27 MED ORDER — AMLODIPINE BESYLATE 10 MG PO TABS
10.0000 mg | ORAL_TABLET | Freq: Every day | ORAL | Status: DC
  Administered 2022-06-27 – 2022-06-28 (×2): 10 mg via ORAL
  Filled 2022-06-27: qty 2
  Filled 2022-06-27 (×2): qty 1

## 2022-06-27 MED ORDER — CALCIUM CARBONATE ANTACID 500 MG PO CHEW
4000.0000 mg | CHEWABLE_TABLET | Freq: Three times a day (TID) | ORAL | Status: DC
  Administered 2022-06-28 – 2022-06-30 (×3): 4000 mg via ORAL
  Filled 2022-06-27: qty 8
  Filled 2022-06-27 (×2): qty 20
  Filled 2022-06-27: qty 8

## 2022-06-27 MED ORDER — LIDOCAINE HCL (PF) 1 % IJ SOLN: 5.0000 mL | INTRAMUSCULAR | Status: DC | PRN

## 2022-06-27 MED ORDER — CLONIDINE HCL 0.1 MG PO TABS
0.1000 mg | ORAL_TABLET | Freq: Every day | ORAL | Status: DC
  Administered 2022-06-28 (×2): 0.1 mg via ORAL
  Filled 2022-06-27 (×3): qty 1

## 2022-06-27 MED ORDER — LIDOCAINE HCL 1 % IJ SOLN
INTRAMUSCULAR | Status: AC
  Filled 2022-06-27: qty 20

## 2022-06-27 MED ORDER — ALTEPLASE 2 MG IJ SOLR: 2.0000 mg | Freq: Once | INTRAMUSCULAR | Status: DC | PRN

## 2022-06-27 MED ORDER — LIDOCAINE-PRILOCAINE 2.5-2.5 % EX CREA
1.0000 | TOPICAL_CREAM | CUTANEOUS | Status: DC | PRN
  Filled 2022-06-27: qty 5

## 2022-06-27 MED ORDER — ALBUTEROL SULFATE (2.5 MG/3ML) 0.083% IN NEBU
10.0000 mg | INHALATION_SOLUTION | Freq: Once | RESPIRATORY_TRACT | Status: DC
  Filled 2022-06-27: qty 12

## 2022-06-27 MED ORDER — SODIUM BICARBONATE 8.4 % IV SOLN
50.0000 meq | Freq: Once | INTRAVENOUS | Status: AC
  Administered 2022-06-27: 50 meq via INTRAVENOUS
  Filled 2022-06-27: qty 50

## 2022-06-27 MED ORDER — HEPARIN SODIUM (PORCINE) 1000 UNIT/ML IJ SOLN
INTRAMUSCULAR | Status: AC | PRN
  Administered 2022-06-27: 2800 [IU] via INTRAVENOUS

## 2022-06-27 MED ORDER — HEPARIN SODIUM (PORCINE) 5000 UNIT/ML IJ SOLN
5000.0000 [IU] | Freq: Three times a day (TID) | INTRAMUSCULAR | Status: DC
  Administered 2022-06-27 – 2022-06-29 (×6): 5000 [IU] via SUBCUTANEOUS
  Filled 2022-06-27 (×8): qty 1

## 2022-06-27 MED ORDER — LIDOCAINE HCL (PF) 1 % IJ SOLN
INTRAMUSCULAR | Status: AC | PRN
  Administered 2022-06-27: 15 mL

## 2022-06-27 MED ORDER — HEPARIN SODIUM (PORCINE) 1000 UNIT/ML DIALYSIS
3000.0000 [IU] | Freq: Once | INTRAMUSCULAR | Status: AC
  Administered 2022-06-27: 3000 [IU] via INTRAVENOUS_CENTRAL
  Filled 2022-06-27 (×2): qty 3

## 2022-06-27 MED ORDER — HEPARIN SODIUM (PORCINE) 1000 UNIT/ML DIALYSIS
1000.0000 [IU] | INTRAMUSCULAR | Status: DC | PRN
  Filled 2022-06-27 (×2): qty 1

## 2022-06-27 MED ORDER — SODIUM BICARBONATE 650 MG PO TABS
1300.0000 mg | ORAL_TABLET | Freq: Two times a day (BID) | ORAL | Status: DC
  Administered 2022-06-27 – 2022-06-29 (×5): 1300 mg via ORAL
  Filled 2022-06-27 (×7): qty 2

## 2022-06-27 MED ORDER — METOPROLOL SUCCINATE ER 25 MG PO TB24: 25.0000 mg | ORAL_TABLET | Freq: Every day | ORAL | Status: DC

## 2022-06-27 MED ORDER — SODIUM ZIRCONIUM CYCLOSILICATE 10 G PO PACK
10.0000 g | PACK | Freq: Once | ORAL | Status: AC
  Administered 2022-06-27: 10 g via ORAL
  Filled 2022-06-27: qty 1

## 2022-06-27 MED ORDER — ANTICOAGULANT SODIUM CITRATE 4% (200MG/5ML) IV SOLN
5.0000 mL | Status: DC | PRN
  Filled 2022-06-27: qty 5

## 2022-06-27 MED ORDER — HEPARIN SODIUM (PORCINE) 1000 UNIT/ML IJ SOLN
INTRAMUSCULAR | Status: AC
  Administered 2022-06-28: 2800 [IU]
  Filled 2022-06-27: qty 10

## 2022-06-27 MED ORDER — ACETAMINOPHEN 500 MG PO TABS
1000.0000 mg | ORAL_TABLET | Freq: Every day | ORAL | Status: DC | PRN
  Administered 2022-06-27 – 2022-06-30 (×3): 1000 mg via ORAL
  Filled 2022-06-27 (×3): qty 2

## 2022-06-27 NOTE — Procedures (Signed)
Interventional Radiology Procedure:   Indications: Occluded AV graft and needs urgent dialysis  Procedure: Placement of non-tunneled dialysis catheter  Findings: Right jugular catheter, tip at SVC/RA junction.    Complications: No immediate complications noted.     EBL: Minimal  Plan: Catheter is ready to use.    Jaimey Franchini R. Anselm Pancoast, MD  Pager: [phone]

## 2022-06-27 NOTE — H&P (Signed)
History and Physical    Darric Plante [place] WGN:[id number] DOB: 16-Jan-1971 DOA: 06/27/2022  PCP: Patient, No Pcp Per (Confirm with patient/family/NH records and if not entered, this has to be entered at [facility] point of entry) Patient coming from: Home  I have personally briefly reviewed patient's old medical records in [place]  Chief Complaint: I was sent here  HPI: [name] is a 51 y.o. 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  Patient last HD was last Wednesday.  Friday, patient went to dialysis when it was found patient's AV shunt clogged, HD session canceled.  Same day, patient came to ED for the first time, but he was evaluated and discussion among nephrology and IR, recommend the patient come in Monday for IR intervention.  Patient came to IR this morning, where he was sent to ED for expedited IR intervention.  Patient no longer makes urine, he has intentionally reduced oral intake since Friday to avoid fluid overload or hyperkalemia, and has been feeling generalized weakness for the last 2 to 3 days, denies any chest pain shortness of breath cough swelling. ED Course: Blood pressure elevated, chest x-ray clear no acute infiltrates not hypoxic afebrile.  K= 6.4, bicarb 15, patient was given hyperkalemia cocktail including calcium gluconate, bicarb 50 mEq IV push and 1 dose of Lokelma.  IR and nephrology consulted, I recommended temporary HD catheter and HD emergently today.  Review of Systems: As per HPI otherwise 14 point review of systems negative.    Past Medical History:  Diagnosis Date   Anemia    Anxiety    Depression    ESRD on hemodialysis ([place])    HD [place] MWF   Hypertension    Thyroid disease     Past Surgical History:  Procedure Laterality Date   ARTERIOVENOUS  GRAFT PLACEMENT Left    "forearm, it's not working; thigh"    ARTERIOVENOUS GRAFT PLACEMENT Left 11/09/2015   AV FISTULA PLACEMENT Right 01/10/2017   Procedure: INSERTION OF ARTERIOVENOUS Right thigh GORE-TEX GRAFT;  Surgeon: [name], MD;  Location: [place];  Service: Vascular;  Laterality: Right;   FALSE ANEURYSM REPAIR Left 11/09/2015   Procedure: REPAIR OF LEFT FEMORAL PSEUDOANEURYSM; REVISION  OF LEFT THIGH ARTERIOVENOUS GRAFT USING 6MM X 10 CM GORETEX GRAFT ;  Surgeon: [name], MD;  Location: [place];  Service: Vascular;  Laterality: Left;   IR AV DIALY SHUNT INTRO NEEDLE/INTRACATH INITIAL W/PTA/IMG RIGHT Right 02/06/2019   IR AV DIALY SHUNT INTRO NEEDLE/INTRACATH INITIAL W/PTA/IMG RIGHT Right 05/14/2019   IR AV DIALY SHUNT INTRO NEEDLE/INTRACATH INITIAL W/PTA/IMG RIGHT Right 08/18/2020   IR AV DIALY SHUNT INTRO NEEDLE/INTRACATH INITIAL W/PTA/IMG RIGHT Right 04/21/2022   IR DIALY SHUNT INTRO NEEDLE/INTRACATH INITIAL W/IMG RIGHT Right 09/17/2019   IR DIALY SHUNT INTRO NEEDLE/INTRACATH INITIAL W/IMG RIGHT Right 12/10/2019   IR FLUORO GUIDE CV LINE RIGHT  07/10/2021   IR GENERIC HISTORICAL  07/15/2016   IR US GUIDE VASC ACCESS LEFT 07/15/2016 [name], MD [facility]   IR GENERIC HISTORICAL Left 07/15/2016   IR THROMBECTOMY AV FISTULA W/THROMBOLYSIS/PTA INC/SHUNT/IMG LEFT 07/15/2016 [name], MD [facility]   IR GENERIC HISTORICAL  10/05/2016   IR US GUIDE VASC ACCESS LEFT 10/05/2016 [name], MD [facility]   IR GENERIC HISTORICAL Left 10/05/2016   IR THROMBECTOMY AV  FISTULA W/THROMBOLYSIS/PTA INC/SHUNT/IMG LEFT 10/05/2016 [name], MD [facility]   IR GENERIC HISTORICAL  10/08/2016   IR FLUORO GUIDE CV LINE RIGHT 10/08/2016 [name], MD [facility]   IR GENERIC HISTORICAL  10/08/2016   IR US GUIDE VASC ACCESS RIGHT 10/08/2016 [name], MD [facility]   IR PTA AND STENT ADDL CENTRAL DIALY SEG THRU DIALY CIRCUIT RIGHT Right 12/10/2019   IR RADIOLOGIST EVAL & MGMT   11/26/2019   IR REMOVAL TUN CV CATH W/O FL  03/16/2017   IR REMOVAL TUN CV CATH W/O FL  07/22/2021   IR THROMBECTOMY AV FISTULA W/THROMBOLYSIS/PTA INC/SHUNT/IMG RIGHT Right 06/16/2018   IR THROMBECTOMY AV FISTULA W/THROMBOLYSIS/PTA INC/SHUNT/IMG RIGHT Right 06/17/2018   IR THROMBECTOMY AV FISTULA W/THROMBOLYSIS/PTA INC/SHUNT/IMG RIGHT Right 07/15/2021   IR US GUIDE VASC ACCESS RIGHT  06/17/2018   IR US GUIDE VASC ACCESS RIGHT  06/16/2018   IR US GUIDE VASC ACCESS RIGHT  02/06/2019   IR US GUIDE VASC ACCESS RIGHT  05/14/2019   IR US GUIDE VASC ACCESS RIGHT  09/17/2019   IR US GUIDE VASC ACCESS RIGHT  12/10/2019   IR US GUIDE VASC ACCESS RIGHT  08/18/2020   IR US GUIDE VASC ACCESS RIGHT  07/10/2021   IR US GUIDE VASC ACCESS RIGHT  07/19/2021   IR US GUIDE VASC ACCESS RIGHT  04/21/2022   PARATHYROIDECTOMY N/A 04/24/2014   Procedure: TOTAL 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;  Surgeon: [name], MD;  Location: [place];  Service: General;  Laterality: N/A;  NECK AND LEFT FOREARM   PERIPHERAL VASCULAR CATHETERIZATION N/A 05/05/2016   Procedure: A/V Shuntogram;  Surgeon: Conrad Bladensburg, MD;  Location: [place] CV [facility];  Service: Cardiovascular;  Laterality: N/A;   PERIPHERAL VASCULAR CATHETERIZATION Left 05/05/2016   Procedure: Peripheral Vascular Balloon Angioplasty;  Surgeon: Conrad Intercourse, MD;  Location: [place] [facility];  Service: Cardiovascular;  Laterality: Left;   PSEUDOANEURYSM REPAIR Left 11/09/2015   TEE WITHOUT CARDIOVERSION N/A 02/19/2021   Procedure: TRANSESOPHAGEAL ECHOCARDIOGRAM (TEE);  Surgeon: [name], MD;  Location: [facility] END[facility];  Service: Cardiovascular;  Laterality: N/A;   THROMBECTOMY / ARTERIOVENOUS GRAFT REVISION Left 08/18/2015   thigh   THROMBECTOMY AND REVISION OF ARTERIOVENTOUS (AV) GORETEX  GRAFT Left 08/23/2015   Procedure: THROMBECTOMY AND REVISION OF ARTERIOVENTOUS (AV) GORETEX  GRAFT LEFT THIGH ;  Surgeon: [name], MD;  Location: [place];   Service: Vascular;  Laterality: Left;   THROMBECTOMY W/ EMBOLECTOMY Left 08/18/2015   Procedure: THROMBECTOMY  AND REVISION ARTERIOVENOUS GORE-TEX GRAFT/LEFT THIGH;  Surgeon: [name], MD;  Location: [place];  Service: Vascular;  Laterality: Left;   THROMBECTOMY W/ EMBOLECTOMY Right 06/19/2018   Procedure: THROMBECTOMY AND REVISION OF RIGHT THIGH  ARTERIOVENOUS GORE-TEX GRAFT;  Surgeon: [name], MD;  Location: [facility] OR;  Service: Vascular;  Laterality: Right;   UPPER EXTREMITY VENOGRAPHY Bilateral 12/27/2016   Procedure: Upper Extremity Venography;  Surgeon: [name], MD;  Location: [place] [facility];  Service: Cardiovascular;  Laterality: Bilateral;   VENOGRAM Left 05/05/2016   Procedure: Venogram;  Surgeon: Conrad Matamoras, MD;  Location: [place] [facility];  Service: Cardiovascular;  Laterality: Left;  lower extremity     reports that he quit smoking about 36 years ago. His smoking use included cigarettes. He has never used smokeless tobacco. He reports that he does not drink alcohol and does not use drugs.  Allergies  Allergen Reactions   Ivp Dye [Iodinated  Contrast Media] Swelling    SWELLING REACTION UNSPECIFIED    Lisinopril Cough   Solu-Medrol [Methylprednisolone] Nausea And Vomiting    Family History  Problem Relation Age of Onset   Renal Disease Neg Hx      Prior to Admission medications   Medication Sig Start Date End Date Taking? Authorizing Provider  acetaminophen (TYLENOL) 500 MG tablet Take 1,000 mg by mouth daily as needed (for pain or headaches).     [provider]  amLODipine (NORVASC) 10 MG tablet Take 10 mg by mouth daily at 2 PM.    [provider]  B Complex-C-Folic Acid (DIALYVITE TABLET) TABS Take 1 tablet by mouth daily. 06/29/20   [provider]  cloNIDine (CATAPRES) 0.1 MG tablet Take 1 tablet (0.1 mg total) by mouth at bedtime. 05/04/22   Troy Sine, MD  diphenhydrAMINE (BENADRYL) 50 MG tablet Take 1 tablet  (50 mg total) by mouth once for 1 dose. 1 hour prior to test 05/04/22 05/04/22  Troy Sine, MD  metoprolol succinate (TOPROL-XL) 25 MG 24 hr tablet Take 1 tablet (25 mg total) by mouth daily. Take with or immediately following a meal. 05/04/22 04/29/23  Troy Sine, MD  predniSONE (DELTASONE) 50 MG tablet Take 1 tablet by mouth 13 hrs prior to test, another tablet 7 hours prior, and last dose 1 hour prior. 05/04/22   Troy Sine, MD  TUMS ULTRA 1000 1000 MG chewable tablet Chew 4,000 mg by mouth 3 (three) times daily with meals. 03/14/20   [provider]  fluticasone (FLONASE) 50 MCG/ACT nasal spray Place 2 sprays into both nostrils daily. Patient not taking: Reported on 06/15/2018 07/25/17 02/20/20  Larene Pickett, PA-C    Physical Exam: Vitals:   06/27/22 1014 06/27/22 1027 06/27/22 1210 06/27/22 1211  BP: (!) 140/72  (!) 152/92   Pulse: 72  (!) 34 62  Resp: 17  18   Temp: 97.7 F (36.5 C)  97.7 F (36.5 C)   TempSrc: Oral  Oral   SpO2: 99%  100% 100%  Weight:  66.7 kg    Height:  6' (1.829 m)      Constitutional: NAD, calm, comfortable Vitals:   06/27/22 1014 06/27/22 1027 06/27/22 1210 06/27/22 1211  BP: (!) 140/72  (!) 152/92   Pulse: 72  (!) 34 62  Resp: 17  18   Temp: 97.7 F (36.5 C)  97.7 F (36.5 C)   TempSrc: Oral  Oral   SpO2: 99%  100% 100%  Weight:  66.7 kg    Height:  6' (1.829 m)     Eyes: PERRL, lids and conjunctivae normal ENMT: Mucous membranes are moist. Posterior pharynx clear of any exudate or lesions.Normal dentition.  Neck: normal, supple, no masses, no thyromegaly Respiratory: clear to auscultation bilaterally, no wheezing, no crackles. Normal respiratory effort. No accessory muscle use.  Cardiovascular: Regular rate and rhythm, no murmurs / rubs / gallops. No extremity edema. 2+ pedal pulses. No carotid bruits.  Abdomen: no tenderness, no masses palpated. No hepatosplenomegaly. Bowel sounds positive.  Musculoskeletal: no clubbing /  cyanosis. No joint deformity upper and lower extremities. Good ROM, no contractures. Normal muscle tone.  Skin: no rashes, lesions, ulcers. No induration Neurologic: CN 2-12 grossly intact. Sensation intact, DTR normal. Strength 5/5 in all 4.  Psychiatric: Normal judgment and insight. Alert and oriented x 3. Normal mood.    Labs on Admission: I have personally reviewed following labs and imaging studies  CBC: Recent Labs  Lab 06/24/22 1327 06/27/22 1029  WBC 9.4 4.2  NEUTROABS 7.1 4.0  HGB 13.3 13.8  HCT 39.7 41.4  MCV 92.8 93.9  PLT 181 284   Basic Metabolic Panel: Recent Labs  Lab 06/24/22 1327 06/27/22 1029  NA 137 138  K 5.2* 6.4*  CL 96* 93*  CO2 24 15*  GLUCOSE 92 117*  BUN 66* 113*  CREATININE 15.20* 23.45*  CALCIUM 8.2* 8.4*   GFR: Estimated Creatinine Clearance: 3.6 mL/min (A) (by C-G formula based on SCr of 23.45 mg/dL (H)). Liver Function Tests: Recent Labs  Lab 06/27/22 1029  AST 13*  ALT 14  ALKPHOS 65  BILITOT 0.8  PROT 9.1*  ALBUMIN 4.2   No results for input(s): "LIPASE", "AMYLASE" in the last 168 hours. No results for input(s): "AMMONIA" in the last 168 hours. Coagulation Profile: No results for input(s): "INR", "PROTIME" in the last 168 hours. Cardiac Enzymes: No results for input(s): "CKTOTAL", "CKMB", "CKMBINDEX", "TROPONINI" in the last 168 hours. BNP (last 3 results) No results for input(s): "PROBNP" in the last 8760 hours. HbA1C: No results for input(s): "HGBA1C" in the last 72 hours. CBG: No results for input(s): "GLUCAP" in the last 168 hours. Lipid Profile: No results for input(s): "CHOL", "HDL", "LDLCALC", "TRIG", "CHOLHDL", "LDLDIRECT" in the last 72 hours. Thyroid Function Tests: No results for input(s): "TSH", "T4TOTAL", "FREET4", "T3FREE", "THYROIDAB" in the last 72 hours. Anemia Panel: No results for input(s): "VITAMINB12", "FOLATE", "FERRITIN", "TIBC", "IRON", "RETICCTPCT" in the last 72 hours. Urine analysis:     Component Value Date/Time   COLORURINE YELLOW 08/01/2009 0742   APPEARANCEUR TURBID (A) 08/01/2009 0742   LABSPEC 1.018 08/01/2009 0742   PHURINE 5.5 08/01/2009 0742   GLUCOSEU NEGATIVE 08/01/2009 0742   HGBUR LARGE (A) 08/01/2009 0742   BILIRUBINUR SMALL (A) 08/01/2009 0742   KETONESUR 15 (A) 08/01/2009 0742   PROTEINUR 100 (A) 08/01/2009 0742   UROBILINOGEN 1.0 08/01/2009 0742   NITRITE NEGATIVE 08/01/2009 0742   LEUKOCYTESUR LARGE (A) 08/01/2009 0742    Radiological Exams on Admission: DG Chest 2 View  Result Date: 06/27/2022 CLINICAL DATA:  Shortness of breath. EXAM: CHEST - 2 VIEW COMPARISON:  Chest x-ray 06/12/2022. FINDINGS: The heart size and mediastinal contours are within normal limits. Both lungs are clear. No visible pleural effusions or pneumothorax. No acute osseous abnormality. IMPRESSION: No evidence of acute cardiopulmonary disease. Electronically Signed   By: Margaretha Sheffield M.D.   On: 06/27/2022 10:45    EKG: Independently reviewed.  Normal sinus rhythm, tented T waves on lateral leads.  Assessment/Plan Principal Problem:   Hyperkalemia  (please populate well all problems here in Problem List. (For example, if patient is on BP meds at home and you resume or decide to hold them, it is a problem that needs to be her. Same for CAD, COPD, HLD and so on)  Hyperkalemia -Secondary to malfunction/clotted right thigh AV fistula and missed dialysis x2.  IR recommended temporary HD catheter will be done this afternoon, and patient will receive emergent HD after catheter placed in. -K= 5.2 after the hyperkalemia cocktail, and HD this afternoon. -Recheck labs tomorrow, may need additional session as per nephrology.  Hypertension, uncontrolled -No significant symptoms signs of fluid overload, patient no longer makes urine, continue home BP regimen including amlodipine clonidine and metoprolol  Chronic HFrEF -Mild fluid overload, emergent HD today.  Reevaluate volume  status tomorrow.  ESRD on HD -With recurrent right AV fistula issue including bleeding, recurrent  clotting status post thrombectomy and ballooning 2 months ago. -Unsure about long-term HD access option at this point, likely p.o. get HD via temporary catheter for some time.  DVT prophylaxis: Heparin subcu Code Status: Full code Family Communication: None at bedside Disposition Plan: Expect 1 to 2 days hospital stay, may need more than 1 sessions of HD. Consults called: IR and nephrology Admission status Tele admission   Lequita Halt MD [facility] Pager [phone]  06/27/2022, 1:11 PM

## 2022-06-27 NOTE — H&P (Signed)
Patient Status: [facility] - ED  Assessment and Plan: Patient in need of venous access for dialysis due to clotted thigh graft.  Patient well known to IR from prior declotting procedures.  He currently has a right thigh AV shunt which was found unusable at dialysis last Wednesday.  IR was contacted on Thursday and was unable to accomodate by the end of the week.  Plan made to reassess for appointment this week.  Patient called this AM and was scheduled for Wednesday, however he presented to the ED this afternoon with worsening hyperkalemia.  Patient does have a contrast allergy requiring pre-medication.  He was given pre-meds over the weekend, however dose was not timed in conjunction with IR availability today.  Discussed with Dr. Hollie Salk and Dr. Anselm Pancoast.  Plan made to proceed with temp cath placement this afternoon in IR.  Temp cath may be removed vs. Patient admitted for monitoring with plan to proceed with original outpatient appointment Wednesday.   Pre-medication will be again ordered based on disposition.   Risks and benefits discussed with the patient including, but not limited to bleeding, infection, vascular injury, pneumothorax which may require chest tube placement, air embolism or even death  All of the patient's questions were answered, patient is agreeable to proceed. Consent signed and in chart.  ______________________________________________________________________   History of Present Illness: [name] is a 51 year old male with medical history significant of ESRD on HD MWF, HTN, chronic HFrEF, recurrent right thigh AV shunt previously requiring intervention for clotting. Hi thigh graft was last angioplastied 04/21/22 by Dr. Kathlene Cote at which time the graft was felt to be amenable to further intervention in the future.   Patient presented to HD last Wednesday but by Friday it was found patient's AV shunt clogged, HD session canceled.  Patient presented to the ED and IR  consulted for possible declot late Friday afternoon.  IR was unable to accommodate and plan was made to reassess availability early this week. IR in contact with patient this AM and first available appointment was made for Wednesday.  He subsequently presented to the ED for possible additional support/intervention. Case discussed with Dr. Anselm Pancoast who recommend proceeding with temp cath placement today   Allergies and medications reviewed.   Review of Systems: A 12 point ROS discussed and pertinent positives are indicated in the HPI above.  All other systems are negative.  Review of Systems  Constitutional:  Negative for fatigue and fever.  Respiratory:  Negative for cough and shortness of breath.   Cardiovascular:  Negative for chest pain.  Gastrointestinal:  Negative for abdominal pain, nausea and vomiting.  Musculoskeletal:  Negative for back pain.  Psychiatric/Behavioral:  Negative for behavioral problems and confusion.     Vital Signs: BP (!) 152/92 (BP Location: Right Arm)   Pulse 62   Temp 97.7 F (36.5 C) (Oral)   Resp 18   Ht 6' (1.829 m)   Wt 147 lb (66.7 kg)   SpO2 100%   BMI 19.94 kg/m   Physical Exam Vitals and nursing note reviewed.  Constitutional:      Appearance: Normal appearance.  HENT:     Mouth/Throat:     Mouth: Mucous membranes are moist.     Pharynx: Oropharynx is clear.  Cardiovascular:     Rate and Rhythm: Normal rate.  Pulmonary:     Effort: Pulmonary effort is normal.     Breath sounds: Normal breath sounds.  Skin:    General: Skin  is warm and dry.  Neurological:     General: No focal deficit present.     Mental Status: He is alert.  Psychiatric:        Mood and Affect: Mood normal.        Behavior: Behavior normal.        Thought Content: Thought content normal.        Judgment: Judgment normal.      Imaging reviewed.   Labs:  COAGS: Recent Labs    03/26/22 1755  INR 1.0  APTT 33    BMP: Recent Labs    03/28/22 2028  06/11/22 1826 06/24/22 1327 06/27/22 1029  NA 141 139 137 138  K 4.0 3.9 5.2* 6.4*  CL 97* 95* 96* 93*  CO2 32 27 24 15*  GLUCOSE 87 125* 92 117*  BUN 17 40* 66* 113*  CALCIUM 8.9 8.7* 8.2* 8.4*  CREATININE 9.08* 12.04* 15.20* 23.45*  GFRNONAA 7* 5* 4* 2*       Electronically Signed: Docia Barrier, PA 06/27/2022, 2:30 PM   I spent a total of 15 minutes in face to face in clinical consultation, greater than 50% of which was counseling/coordinating care for venous access.

## 2022-06-27 NOTE — ED Provider Notes (Signed)
[facility] EMERGENCY DEPARTMENT Provider Note   CSN: [id number] Arrival date & time: 06/27/22  6295     History {Add pertinent medical, surgical, social history, OB history to HPI:1} Chief Complaint  Patient presents with   Vascular Access Problem    [name] is a 51 y.o. male.  HPI Patient presents with clotted right thigh graft.  He is end-stage renal disease patient.  On Friday had been seen in the ER but needed steroid prep for declotting unable to get done.  Sounds the plan was to get it done on Monday.  Had had a steroid treatment done became and states they cannot get it done today.  States has been limiting his fluid intake.  States he does feel fatigued.  He has not been dialyzed since Wednesday last week with today being Monday.   Past Medical History:  Diagnosis Date   Anemia    Anxiety    Depression    ESRD on hemodialysis ([place])    HD [facility] MWF   Hypertension    Thyroid disease     Home Medications Prior to Admission medications   Medication Sig Start Date End Date Taking? Authorizing Provider  acetaminophen (TYLENOL) 500 MG tablet Take 1,000 mg by mouth daily as needed (for pain or headaches).     [provider]  amLODipine (NORVASC) 10 MG tablet Take 10 mg by mouth daily at 2 PM.    [provider]  B Complex-C-Folic Acid (DIALYVITE TABLET) TABS Take 1 tablet by mouth daily. 06/29/20   [provider]  cloNIDine (CATAPRES) 0.1 MG tablet Take 1 tablet (0.1 mg total) by mouth at bedtime. 05/04/22   Troy Sine, MD  diphenhydrAMINE (BENADRYL) 50 MG tablet Take 1 tablet (50 mg total) by mouth once for 1 dose. 1 hour prior to test 05/04/22 05/04/22  Troy Sine, MD  metoprolol succinate (TOPROL-XL) 25 MG 24 hr tablet Take 1 tablet (25 mg total) by mouth daily. Take with or immediately following a meal. 05/04/22 04/29/23  Troy Sine, MD  predniSONE (DELTASONE) 50 MG tablet Take 1 tablet by mouth  13 hrs prior to test, another tablet 7 hours prior, and last dose 1 hour prior. 05/04/22   Troy Sine, MD  TUMS ULTRA 1000 1000 MG chewable tablet Chew 4,000 mg by mouth 3 (three) times daily with meals. 03/14/20   [provider]  fluticasone (FLONASE) 50 MCG/ACT nasal spray Place 2 sprays into both nostrils daily. Patient not taking: Reported on 06/15/2018 07/25/17 02/20/20  Larene Pickett, PA-C      Allergies    Ivp dye [iodinated contrast media], Lisinopril, and Solu-medrol [methylprednisolone]    Review of Systems   Review of Systems  Physical Exam Updated Vital Signs BP (!) 152/92 (BP Location: Right Arm)   Pulse 62   Temp 97.7 F (36.5 C) (Oral)   Resp 18   Ht 6' (1.829 m)   Wt 66.7 kg   SpO2 100%   BMI 19.94 kg/m  Physical Exam Vitals and nursing note reviewed.  HENT:     Head: Normocephalic.  Pulmonary:     Breath sounds: No wheezing or rales.  Abdominal:     Tenderness: There is no abdominal tenderness.  Musculoskeletal:     Comments: No pulse or thrill felt in right thigh graft.  Skin:    Capillary Refill: Capillary refill takes less than 2 seconds.  Neurological:  Mental Status: He is alert and oriented to person, place, and time.     ED Results / Procedures / Treatments   Labs (all labs ordered are listed, but only abnormal results are displayed) Labs Reviewed  COMPREHENSIVE METABOLIC PANEL - Abnormal; Notable for the following components:      Result Value   Potassium 6.4 (*)    Chloride 93 (*)    CO2 15 (*)    Glucose, Bld 117 (*)    BUN 113 (*)    Creatinine, Ser 23.45 (*)    Calcium 8.4 (*)    Total Protein 9.1 (*)    AST 13 (*)    GFR, Estimated 2 (*)    Anion gap 30 (*)    All other components within normal limits  CBC WITH DIFFERENTIAL/PLATELET - Abnormal; Notable for the following components:   Lymphs Abs 0.2 (*)    Monocytes Absolute 0.0 (*)    All other components within normal limits    EKG EKG  Interpretation  Date/Time:  Monday June 27 2022 10:32:34 EDT Ventricular Rate:  65 PR Interval:  187 QRS Duration: 90 QT Interval:  468 QTC Calculation: 487 R Axis:   69 Text Interpretation: Sinus rhythm Biatrial enlargement Left ventricular hypertrophy Borderline prolonged QT interval T waves more prominant Confirmed by Davonna Belling [phone]) on 06/27/2022 12:24:41 PM  Radiology DG Chest 2 View  Result Date: 06/27/2022 CLINICAL DATA:  Shortness of breath. EXAM: CHEST - 2 VIEW COMPARISON:  Chest x-ray 06/12/2022. FINDINGS: The heart size and mediastinal contours are within normal limits. Both lungs are clear. No visible pleural effusions or pneumothorax. No acute osseous abnormality. IMPRESSION: No evidence of acute cardiopulmonary disease. Electronically Signed   By: Margaretha Sheffield M.D.   On: 06/27/2022 10:45    Procedures Procedures  {Document cardiac monitor, telemetry assessment procedure when appropriate:1}  Medications Ordered in ED Medications - No data to display  ED Course/ Medical Decision Making/ A&P                           Medical Decision Making  Patient end-stage renal disease.  On dialysis but has not been dialyzed for the last 2 sessions due to clotted graft.  Sounds as if he got his steroids for premedication to get it done but was unable to get it done today.  Hyperkalemia now up to 6.4.  Does have some peaked T waves.  Discussed with Markus Daft from interventional radiology who did not have a plan or no over the patient for today.  Unable to say for sure whether they be able to get it done or not.  We will also discuss with nephrology.  {Document critical care time when appropriate:1} {Document review of labs and clinical decision tools ie heart score, Chads2Vasc2 etc:1}  {Document your independent review of radiology images, and any outside records:1} {Document your discussion with family members, caretakers, and with consultants:1} {Document social  determinants of health affecting pt's care:1} {Document your decision making why or why not admission, treatments were needed:1} Final Clinical Impression(s) / ED Diagnoses Final diagnoses:  None    Rx / DC Orders ED Discharge Orders     None

## 2022-06-27 NOTE — ED Triage Notes (Signed)
Patient was here last Friday for same, fistula in right leg is not working and has not had dialysis since last Wednesday.

## 2022-06-27 NOTE — Progress Notes (Signed)
Radiology scheduler contacted me this morning regarding this pt. She states that pt was premedicated for declot procedure today in radiology but pt has not been scheduled. Per note in pt chart, Dr Serafina Royals stated for the pt to "follow up on Monday to schedule the procedure as an outpatient." Unfortunately, this does not mean that the procedure was scheduled. I am unaware of who sent in the order for premedication or the time the medication was to be administered as this relates to the time the procedure can occur.  I advised that pt can be scheduled at the next available appointment in radiology or pt can return to ED if condition worsens and be evaluated for temp cath placement if  necessary. Radiology scheduler relayed this information to patient.

## 2022-06-27 NOTE — ED Provider Triage Note (Signed)
Emergency Medicine Provider Triage Evaluation Note  [name] , a 51 y.o. male  was evaluated in triage.  Pt complains of generalized fatigue, shortness of breath.  Patient is a hemodialysis patient receiving dialysis on Monday Wednesday and Friday.  He does not make any urine.  Patient states that he went to dialysis this past Wednesday but his access site clotted up.  He was seen for the same symptoms proximately 3 days ago.  He called IR earlier this morning and they push back his appointment again to Wednesday.  He presents emergency department for potential expedited IR intervention.  Denies fever, chills, night sweats, chest pain, abdominal pain, nausea, vomiting, changes in bowel habits.  Patient states that he has limited his p.o. fluid intake to almost nothing.  Review of Systems  Positive: See above Negative:   Physical Exam  BP (!) 140/72 (BP Location: Right Arm)   Pulse 72   Temp 97.7 F (36.5 C) (Oral)   Resp 17   SpO2 99%  Gen:   Awake, no distress   Resp:  Normal effort  MSK:   Moves extremities without difficulty  Other:  No lower extremity edema noted.  Mild rales auscultated bilateral middle lung fields.  Medical Decision Making  Medically screening exam initiated at 10:20 AM.  Appropriate orders placed.  [name] was informed that the remainder of the evaluation will be completed by another provider, this initial triage assessment does not replace that evaluation, and the importance of remaining in the ED until their evaluation is complete.    Wilnette Kales, [place] 06/27/22 1023

## 2022-06-27 NOTE — Consult Note (Signed)
[facility]  HISTORY AND PHYSICAL  [name] is an 51 y.o. male.    Chief Complaint: clotted AVG access  HPI: Pt is a 28M with a PMH sig for ESRD on HD MWF at [facility], HTN, and multiple accesses who is now seen in consultation at the request of Dr. Roosevelt Locks for eval and recs re: provision of HD, management of ESRD, and management of hyperkalemia.  Pt was noted to have a clotted R thigh AVG on Thursday.  Came to ED on Friday- there was a possible plan for declot on Monday.  Premeds were called in and pt has been taking them.  He has also not been eating/drinking very much in an effort to not get fluid overloaded.    Came today but appointment in IR is not until Wednesday.  K is 6.4.  Plan for temp cath, dialysis here today and tomorrow, and declot Wednesday.  In this setting we are seeing him.  Pt is getting albuterol. Calcium. Lokelma, and bicarb for treatment of hyperkalemia.  Last full HD session was Wednesday and occurred without issue.    OP HD orders: [facility] MWF 4 hrs EDW 67.5 F 180 BFR 450 mL/ min DFR A 1.5 2 K 2 Ca bath UF Profile 4 AVG Heparin 1500 unit bolus  PMH: Past Medical History:  Diagnosis Date   Anemia    Anxiety    Depression    ESRD on hemodialysis ([facility])    HD [facility] MWF   Hypertension    Thyroid disease    PSH: Past Surgical History:  Procedure Laterality Date   ARTERIOVENOUS GRAFT PLACEMENT Left    "forearm, it's not working; thigh"    ARTERIOVENOUS GRAFT PLACEMENT Left 11/09/2015   AV FISTULA PLACEMENT Right 01/10/2017   Procedure: INSERTION OF ARTERIOVENOUS Right thigh GORE-TEX GRAFT;  Surgeon: [name], MD;  Location: [facility] OR;  Service: Vascular;  Laterality: Right;   FALSE ANEURYSM REPAIR Left 11/09/2015   Procedure: REPAIR OF LEFT FEMORAL PSEUDOANEURYSM; REVISION  OF LEFT THIGH ARTERIOVENOUS GRAFT USING 6MM X 10 CM GORETEX GRAFT ;  Surgeon: [name], MD;  Location: [facility] OR;  Service: Vascular;  Laterality: Left;    IR AV DIALY SHUNT INTRO NEEDLE/INTRACATH INITIAL W/PTA/IMG RIGHT Right 02/06/2019   IR AV DIALY SHUNT INTRO NEEDLE/INTRACATH INITIAL W/PTA/IMG RIGHT Right 05/14/2019   IR AV DIALY SHUNT INTRO NEEDLE/INTRACATH INITIAL W/PTA/IMG RIGHT Right 08/18/2020   IR AV DIALY SHUNT INTRO NEEDLE/INTRACATH INITIAL W/PTA/IMG RIGHT Right 04/21/2022   IR DIALY SHUNT INTRO NEEDLE/INTRACATH INITIAL W/IMG RIGHT Right 09/17/2019   IR DIALY SHUNT INTRO NEEDLE/INTRACATH INITIAL W/IMG RIGHT Right 12/10/2019   IR FLUORO GUIDE CV LINE RIGHT  07/10/2021   IR GENERIC HISTORICAL  07/15/2016   IR US GUIDE VASC ACCESS LEFT 07/15/2016 [name], MD [facility]   IR GENERIC HISTORICAL Left 07/15/2016   IR THROMBECTOMY AV FISTULA W/THROMBOLYSIS/PTA INC/SHUNT/IMG LEFT 07/15/2016 [name], MD [facility]   IR GENERIC HISTORICAL  10/05/2016   IR US GUIDE VASC ACCESS LEFT 10/05/2016 [name], MD [facility]   IR GENERIC HISTORICAL Left 10/05/2016   IR THROMBECTOMY AV FISTULA W/THROMBOLYSIS/PTA INC/SHUNT/IMG LEFT 10/05/2016 [name], MD [facility]   IR GENERIC HISTORICAL  10/08/2016   IR FLUORO GUIDE CV LINE RIGHT 10/08/2016 [name], MD [facility]   IR GENERIC HISTORICAL  10/08/2016   IR US GUIDE VASC ACCESS RIGHT 10/08/2016 [name], MD [facility]   IR PTA AND  STENT ADDL CENTRAL DIALY SEG THRU DIALY CIRCUIT RIGHT Right 12/10/2019   IR RADIOLOGIST EVAL & MGMT  11/26/2019   IR REMOVAL TUN CV CATH W/O FL  03/16/2017   IR REMOVAL TUN CV CATH W/O FL  07/22/2021   IR THROMBECTOMY AV FISTULA W/THROMBOLYSIS/PTA INC/SHUNT/IMG RIGHT Right 06/16/2018   IR THROMBECTOMY AV FISTULA W/THROMBOLYSIS/PTA INC/SHUNT/IMG RIGHT Right 06/17/2018   IR THROMBECTOMY AV FISTULA W/THROMBOLYSIS/PTA INC/SHUNT/IMG RIGHT Right 07/15/2021   IR US GUIDE VASC ACCESS RIGHT  06/17/2018   IR US GUIDE VASC ACCESS RIGHT  06/16/2018   IR US GUIDE VASC ACCESS RIGHT  02/06/2019   IR US GUIDE VASC ACCESS RIGHT  05/14/2019   IR US GUIDE VASC ACCESS RIGHT   09/17/2019   IR US GUIDE VASC ACCESS RIGHT  12/10/2019   IR US GUIDE VASC ACCESS RIGHT  08/18/2020   IR US GUIDE VASC ACCESS RIGHT  07/10/2021   IR US GUIDE VASC ACCESS RIGHT  07/19/2021   IR US GUIDE VASC ACCESS RIGHT  04/21/2022   PARATHYROIDECTOMY N/A 04/24/2014   Procedure: TOTAL 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;  Surgeon: [name], MD;  Location: [facility];  Service: General;  Laterality: N/A;  NECK AND LEFT FOREARM   PERIPHERAL VASCULAR CATHETERIZATION N/A 05/05/2016   Procedure: A/V Shuntogram;  Surgeon: Conrad [place], MD;  Location: [facility] CV LAB;  Service: Cardiovascular;  Laterality: N/A;   PERIPHERAL VASCULAR CATHETERIZATION Left 05/05/2016   Procedure: Peripheral Vascular Balloon Angioplasty;  Surgeon: Conrad [place], MD;  Location: [facility] CV LAB;  Service: Cardiovascular;  Laterality: Left;   PSEUDOANEURYSM REPAIR Left 11/09/2015   TEE WITHOUT CARDIOVERSION N/A 02/19/2021   Procedure: TRANSESOPHAGEAL ECHOCARDIOGRAM (TEE);  Surgeon: [name], MD;  Location: [facility] ENDOSCOPY;  Service: Cardiovascular;  Laterality: N/A;   THROMBECTOMY / ARTERIOVENOUS GRAFT REVISION Left 08/18/2015   thigh   THROMBECTOMY AND REVISION OF ARTERIOVENTOUS (AV) GORETEX  GRAFT Left 08/23/2015   Procedure: THROMBECTOMY AND REVISION OF ARTERIOVENTOUS (AV) GORETEX  GRAFT LEFT THIGH ;  Surgeon: [name], MD;  Location: [facility];  Service: Vascular;  Laterality: Left;   THROMBECTOMY W/ EMBOLECTOMY Left 08/18/2015   Procedure: THROMBECTOMY  AND REVISION ARTERIOVENOUS GORE-TEX GRAFT/LEFT THIGH;  Surgeon: [name], MD;  Location: [facility];  Service: Vascular;  Laterality: Left;   THROMBECTOMY W/ EMBOLECTOMY Right 06/19/2018   Procedure: THROMBECTOMY AND REVISION OF RIGHT THIGH  ARTERIOVENOUS GORE-TEX GRAFT;  Surgeon: [name], MD;  Location: [facility] OR;  Service: Vascular;  Laterality: Right;   UPPER EXTREMITY VENOGRAPHY Bilateral 12/27/2016   Procedure: Upper Extremity  Venography;  Surgeon: [name], MD;  Location: [facility] CV LAB;  Service: Cardiovascular;  Laterality: Bilateral;   VENOGRAM Left 05/05/2016   Procedure: Venogram;  Surgeon: Conrad Holbrook, MD;  Location: [facility] CV LAB;  Service: Cardiovascular;  Laterality: Left;  lower extremity    Past Medical History:  Diagnosis Date   Anemia    Anxiety    Depression    ESRD on hemodialysis ([place])    HD [facility] MWF   Hypertension    Thyroid disease     Medications:  Scheduled:  albuterol  10 mg Nebulization Once   amLODipine  10 mg Oral Q1400   calcium carbonate  4,000 mg Oral TID WC   [START ON 06/28/2022] Chlorhexidine Gluconate Cloth  6 each Topical Q0600   cloNIDine  0.1 mg Oral QHS   heparin  5,000 Units Subcutaneous Q8H  metoprolol succinate  25 mg Oral Daily   sodium bicarbonate  1,300 mg Oral BID    (Not in a hospital admission)   ALLERGIES:   Allergies  Allergen Reactions   Ivp Dye [Iodinated Contrast Media] Swelling    SWELLING REACTION UNSPECIFIED    Lisinopril Cough   Solu-Medrol [Methylprednisolone] Nausea And Vomiting    FAM HX: Family History  Problem Relation Age of Onset   Renal Disease Neg Hx     Social History:   reports that he quit smoking about 36 years ago. His smoking use included cigarettes. He has never used smokeless tobacco. He reports that he does not drink alcohol and does not use drugs.  ROS: ROS: all other systems reviewed and are negative except as per HPI  Blood pressure (!) 152/92, pulse 62, temperature 97.7 F (36.5 C), temperature source Oral, resp. rate 18, height 6' (1.829 m), weight 66.7 kg, SpO2 100 %. PHYSICAL EXAM: Physical Exam GEN NAD, lying flat in bed HEENT EOMI PERRL NECK no JVD PULM clear CV RRR no m/r/g ABD soft, nontender NABS EXT no LE edema NEURO  AAO x 3 nonfocal ACCESS: R thigh AVG no T/B   Results for orders placed or performed during the hospital encounter of 06/27/22 (from the past 48  hour(s))  Comprehensive metabolic panel     Status: Abnormal   Collection Time: 06/27/22 10:29 AM  Result Value Ref Range   Sodium 138 135 - 145 mmol/L   Potassium 6.4 (HH) 3.5 - 5.1 mmol/L   Chloride 93 (L) 98 - 111 mmol/L   CO2 15 (L) 22 - 32 mmol/L   Glucose, Bld 117 (H) 70 - 99 mg/dL    Comment: Glucose reference range applies only to samples taken after fasting for at least 8 hours.   BUN 113 (H) 6 - 20 mg/dL   Creatinine, Ser 23.45 (H) 0.61 - 1.24 mg/dL   Calcium 8.4 (L) 8.9 - 10.3 mg/dL   Total Protein 9.1 (H) 6.5 - 8.1 g/dL   Albumin 4.2 3.5 - 5.0 g/dL   AST 13 (L) 15 - 41 U/L   ALT 14 0 - 44 U/L   Alkaline Phosphatase 65 38 - 126 U/L   Total Bilirubin 0.8 0.3 - 1.2 mg/dL   GFR, Estimated 2 (L) >60 mL/min    Comment: (NOTE) Calculated using the CKD-EPI Creatinine Equation (2021)    Anion gap 30 (H) 5 - 15    Comment: Performed at [facility], [place] [address], [place], [place] [address]  CBC with Differential     Status: Abnormal   Collection Time: 06/27/22 10:29 AM  Result Value Ref Range   WBC 4.2 4.0 - 10.5 K/uL   RBC 4.41 4.22 - 5.81 MIL/uL   Hemoglobin 13.8 13.0 - 17.0 g/dL   HCT 41.4 39.0 - 52.0 %   MCV 93.9 80.0 - 100.0 fL   MCH 31.3 26.0 - 34.0 pg   MCHC 33.3 30.0 - 36.0 g/dL   RDW 14.4 11.5 - 15.5 %   Platelets 229 150 - 400 K/uL   nRBC 0.0 0.0 - 0.2 %   Neutrophils Relative % 96 %   Neutro Abs 4.0 1.7 - 7.7 K/uL   Lymphocytes Relative 4 %   Lymphs Abs 0.2 (L) 0.7 - 4.0 K/uL   Monocytes Relative 0 %   Monocytes Absolute 0.0 (L) 0.1 - 1.0 K/uL   Eosinophils Relative 0 %   Eosinophils Absolute 0.0 0.0 -  0.5 K/uL   Basophils Relative 0 %   Basophils Absolute 0.0 0.0 - 0.1 K/uL   nRBC 0 0 /100 WBC   Abs Immature Granulocytes 0.00 0.00 - 0.07 K/uL    Comment: Performed at [facility], [place] [address], [place], [place] [address]    DG Chest 2 View  Result Date: 06/27/2022 CLINICAL DATA:  Shortness of breath. EXAM: CHEST - 2 VIEW COMPARISON:   Chest x-ray 06/12/2022. FINDINGS: The heart size and mediastinal contours are within normal limits. Both lungs are clear. No visible pleural effusions or pneumothorax. No acute osseous abnormality. IMPRESSION: No evidence of acute cardiopulmonary disease. Electronically Signed   By: Margaretha Sheffield M.D.   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

## 2022-06-27 NOTE — ED Notes (Signed)
Patient placement updated on status of patient

## 2022-06-27 NOTE — ED Notes (Signed)
Pt transported to dialysis

## 2022-06-28 ENCOUNTER — Other Ambulatory Visit: Payer: Self-pay | Admitting: Radiology

## 2022-06-28 DIAGNOSIS — N186 End stage renal disease: Secondary | ICD-10-CM

## 2022-06-28 DIAGNOSIS — Z789 Other specified health status: Secondary | ICD-10-CM

## 2022-06-28 DIAGNOSIS — I10 Essential (primary) hypertension: Secondary | ICD-10-CM

## 2022-06-28 LAB — CBC
HCT: 39.7 % (ref 39.0–52.0)
Hemoglobin: 14 g/dL (ref 13.0–17.0)
MCH: 31.6 pg (ref 26.0–34.0)
MCHC: 35.3 g/dL (ref 30.0–36.0)
MCV: 89.6 fL (ref 80.0–100.0)
Platelets: 256 10*3/uL (ref 150–400)
RBC: 4.43 MIL/uL (ref 4.22–5.81)
RDW: 14.1 % (ref 11.5–15.5)
WBC: 6.1 10*3/uL (ref 4.0–10.5)
nRBC: 0 % (ref 0.0–0.2)

## 2022-06-28 LAB — COMPREHENSIVE METABOLIC PANEL
ALT: 14 U/L (ref 0–44)
AST: 13 U/L — ABNORMAL LOW (ref 15–41)
Albumin: 4.2 g/dL (ref 3.5–5.0)
Alkaline Phosphatase: 65 U/L (ref 38–126)
Anion gap: 30 — ABNORMAL HIGH (ref 5–15)
BUN: 113 mg/dL — ABNORMAL HIGH (ref 6–20)
CO2: 15 mmol/L — ABNORMAL LOW (ref 22–32)
Calcium: 8.4 mg/dL — ABNORMAL LOW (ref 8.9–10.3)
Chloride: 93 mmol/L — ABNORMAL LOW (ref 98–111)
Creatinine, Ser: 23.45 mg/dL — ABNORMAL HIGH (ref 0.61–1.24)
GFR, Estimated: 2 mL/min — ABNORMAL LOW (ref >60)
Glucose, Bld: 117 mg/dL — ABNORMAL HIGH (ref 70–99)
Potassium: 6.4 mmol/L (ref 3.5–5.1)
Sodium: 138 mmol/L (ref 135–145)
Total Bilirubin: 0.8 mg/dL (ref 0.3–1.2)
Total Protein: 9.1 g/dL — ABNORMAL HIGH (ref 6.5–8.1)

## 2022-06-28 LAB — HEPATITIS B SURFACE ANTIGEN: Hepatitis B Surface Ag: REACTIVE — AB

## 2022-06-28 LAB — BASIC METABOLIC PANEL
Anion gap: 19 — ABNORMAL HIGH (ref 5–15)
BUN: 47 mg/dL — ABNORMAL HIGH (ref 6–20)
CO2: 27 mmol/L (ref 22–32)
Calcium: 8.8 mg/dL — ABNORMAL LOW (ref 8.9–10.3)
Chloride: 89 mmol/L — ABNORMAL LOW (ref 98–111)
Creatinine, Ser: 13.53 mg/dL — ABNORMAL HIGH (ref 0.61–1.24)
GFR, Estimated: 4 mL/min — ABNORMAL LOW (ref >60)
Glucose, Bld: 203 mg/dL — ABNORMAL HIGH (ref 70–99)
Potassium: 4.3 mmol/L (ref 3.5–5.1)
Sodium: 135 mmol/L (ref 135–145)

## 2022-06-28 LAB — HEPATITIS B SURFACE ANTIBODY, QUANTITATIVE: Hep B S AB Quant (Post): <3.1 m[IU]/mL — ABNORMAL LOW (ref >9.9)

## 2022-06-28 MED ORDER — PREDNISONE 5 MG PO TABS
50.0000 mg | ORAL_TABLET | Freq: Three times a day (TID) | ORAL | Status: AC
  Administered 2022-06-28 – 2022-06-29 (×3): 50 mg via ORAL
  Filled 2022-06-28 (×3): qty 2

## 2022-06-28 MED ORDER — DIPHENHYDRAMINE HCL 25 MG PO CAPS
50.0000 mg | ORAL_CAPSULE | Freq: Once | ORAL | Status: AC
  Administered 2022-06-29: 50 mg via ORAL
  Filled 2022-06-28: qty 2

## 2022-06-28 MED ORDER — HEPARIN SODIUM (PORCINE) 1000 UNIT/ML IJ SOLN
INTRAMUSCULAR | Status: AC
  Filled 2022-06-28: qty 3

## 2022-06-28 NOTE — Procedures (Signed)
HD Note:  Some information was entered later than the data was gathered due to patient care needs. The stated time with the data is accurate.      Patient alert and oriented upon arrival onto the [facility].  No complaints.  Patient catheter flushed well during set up.  The catheter stopped being able to pull for the atrial line.  Treatment ended at 30 minutes. UF -100.  Patient assessment unchanged since pre assessment completed.  Patient was treated in [place] [facility], the isolation bay, related to Hep B positive status.

## 2022-06-28 NOTE — Consult Note (Addendum)
Chief Complaint: Patient was seen in consultation today for right thigh dialysis graft declot Chief Complaint  Patient presents with   Vascular Access Problem   at the request of Dr Laurena Bering   Supervising Physician: Mir, Sharen Heck  Patient Status: [facility] - In-pt  History of Present Illness: [name] is a 51 y.o. male    ESRD Rt thigh dialysis graft' Well known to IR IR was notified of need of declot on Thursday last week but could not accommodate OP appointment Was given OP appt for 9/27 Was admitted 9/25 for worsening hyperkalemia Was seen 9/25 for temp/non tunneled catheter placed in IR  Still scheduled for declot procedure 9/27 in IR  Last use was almost 1 week ago--- completed dialysis By next day graft was clotted Temp cath in use since 9/25  Pt had contrast allergy-- premedication set/ ordered for pre procedure 9 am plan for tomorrow  Past Medical History:  Diagnosis Date   Anemia    Anxiety    Depression    ESRD on hemodialysis ([place])    HD [place] MWF   Hypertension    Thyroid disease     Past Surgical History:  Procedure Laterality Date   ARTERIOVENOUS GRAFT PLACEMENT Left    "forearm, it's not working; thigh"    ARTERIOVENOUS GRAFT PLACEMENT Left 11/09/2015   AV FISTULA PLACEMENT Right 01/10/2017   Procedure: INSERTION OF ARTERIOVENOUS Right thigh GORE-TEX GRAFT;  Surgeon: [name], MD;  Location: [facility] OR;  Service: Vascular;  Laterality: Right;   FALSE ANEURYSM REPAIR Left 11/09/2015   Procedure: REPAIR OF LEFT FEMORAL PSEUDOANEURYSM; REVISION  OF LEFT THIGH ARTERIOVENOUS GRAFT USING 6MM X 10 CM GORETEX GRAFT ;  Surgeon: [name], MD;  Location: [facility] OR;  Service: Vascular;  Laterality: Left;   IR AV DIALY SHUNT INTRO NEEDLE/INTRACATH INITIAL W/PTA/IMG RIGHT Right 02/06/2019   IR AV DIALY SHUNT INTRO NEEDLE/INTRACATH INITIAL W/PTA/IMG RIGHT Right 05/14/2019   IR AV DIALY SHUNT INTRO NEEDLE/INTRACATH INITIAL W/PTA/IMG RIGHT Right  08/18/2020   IR AV DIALY SHUNT INTRO NEEDLE/INTRACATH INITIAL W/PTA/IMG RIGHT Right 04/21/2022   IR DIALY SHUNT INTRO NEEDLE/INTRACATH INITIAL W/IMG RIGHT Right 09/17/2019   IR DIALY SHUNT INTRO NEEDLE/INTRACATH INITIAL W/IMG RIGHT Right 12/10/2019   IR FLUORO GUIDE CV LINE RIGHT  07/10/2021   IR FLUORO GUIDE CV LINE RIGHT  06/27/2022   IR GENERIC HISTORICAL  07/15/2016   IR US GUIDE VASC ACCESS LEFT 07/15/2016 [name], MD [facility]   IR GENERIC HISTORICAL Left 07/15/2016   IR THROMBECTOMY AV FISTULA W/THROMBOLYSIS/PTA INC/SHUNT/IMG LEFT 07/15/2016 [name], MD [facility]   IR GENERIC HISTORICAL  10/05/2016   IR US GUIDE VASC ACCESS LEFT 10/05/2016 [name], MD [facility]   IR GENERIC HISTORICAL Left 10/05/2016   IR THROMBECTOMY AV FISTULA W/THROMBOLYSIS/PTA INC/SHUNT/IMG LEFT 10/05/2016 [name], MD [facility]   IR GENERIC HISTORICAL  10/08/2016   IR FLUORO GUIDE CV LINE RIGHT 10/08/2016 [name], MD [facility]   IR GENERIC HISTORICAL  10/08/2016   IR US GUIDE VASC ACCESS RIGHT 10/08/2016 [name], MD [facility]   IR PTA AND STENT ADDL CENTRAL DIALY SEG THRU DIALY CIRCUIT RIGHT Right 12/10/2019   IR RADIOLOGIST EVAL & MGMT  11/26/2019   IR REMOVAL TUN CV CATH W/O FL  03/16/2017   IR REMOVAL TUN CV CATH W/O FL  07/22/2021   IR THROMBECTOMY AV FISTULA W/THROMBOLYSIS/PTA INC/SHUNT/IMG RIGHT Right 06/16/2018   IR THROMBECTOMY AV FISTULA  W/THROMBOLYSIS/PTA INC/SHUNT/IMG RIGHT Right 06/17/2018   IR THROMBECTOMY AV FISTULA W/THROMBOLYSIS/PTA INC/SHUNT/IMG RIGHT Right 07/15/2021   IR US GUIDE VASC ACCESS RIGHT  06/17/2018   IR US GUIDE VASC ACCESS RIGHT  06/16/2018   IR US GUIDE VASC ACCESS RIGHT  02/06/2019   IR US GUIDE VASC ACCESS RIGHT  05/14/2019   IR US GUIDE VASC ACCESS RIGHT  09/17/2019   IR US GUIDE VASC ACCESS RIGHT  12/10/2019   IR US GUIDE VASC ACCESS RIGHT  08/18/2020   IR US GUIDE VASC ACCESS RIGHT  07/10/2021   IR US GUIDE VASC ACCESS RIGHT  07/19/2021   IR US GUIDE  VASC ACCESS RIGHT  04/21/2022   IR US GUIDE VASC ACCESS RIGHT  06/27/2022   PARATHYROIDECTOMY N/A 04/24/2014   Procedure: TOTAL 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;  Surgeon: [name], MD;  Location: [place];  Service: General;  Laterality: N/A;  NECK AND LEFT FOREARM   PERIPHERAL VASCULAR CATHETERIZATION N/A 05/05/2016   Procedure: A/V Shuntogram;  Surgeon: Conrad Lebanon, MD;  Location: [place] [facility];  Service: Cardiovascular;  Laterality: N/A;   PERIPHERAL VASCULAR CATHETERIZATION Left 05/05/2016   Procedure: Peripheral Vascular Balloon Angioplasty;  Surgeon: Conrad Castroville, MD;  Location: [place] [facility] LAB;  Service: Cardiovascular;  Laterality: Left;   PSEUDOANEURYSM REPAIR Left 11/09/2015   TEE WITHOUT CARDIOVERSION N/A 02/19/2021   Procedure: TRANSESOPHAGEAL ECHOCARDIOGRAM (TEE);  Surgeon: [name], MD;  Location: [facility] ENDOSCOPY;  Service: Cardiovascular;  Laterality: N/A;   THROMBECTOMY / ARTERIOVENOUS GRAFT REVISION Left 08/18/2015   thigh   THROMBECTOMY AND REVISION OF ARTERIOVENTOUS (AV) GORETEX  GRAFT Left 08/23/2015   Procedure: THROMBECTOMY AND REVISION OF ARTERIOVENTOUS (AV) GORETEX  GRAFT LEFT THIGH ;  Surgeon: [name], MD;  Location: [place];  Service: Vascular;  Laterality: Left;   THROMBECTOMY W/ EMBOLECTOMY Left 08/18/2015   Procedure: THROMBECTOMY  AND REVISION ARTERIOVENOUS GORE-TEX GRAFT/LEFT THIGH;  Surgeon: [name], MD;  Location: [place];  Service: Vascular;  Laterality: Left;   THROMBECTOMY W/ EMBOLECTOMY Right 06/19/2018   Procedure: THROMBECTOMY AND REVISION OF RIGHT THIGH  ARTERIOVENOUS GORE-TEX GRAFT;  Surgeon: [name], MD;  Location: [facility] OR;  Service: Vascular;  Laterality: Right;   UPPER EXTREMITY VENOGRAPHY Bilateral 12/27/2016   Procedure: Upper Extremity Venography;  Surgeon: [name], MD;  Location: [place] CV LAB;  Service: Cardiovascular;  Laterality: Bilateral;   VENOGRAM Left 05/05/2016   Procedure:  Venogram;  Surgeon: Conrad Holualoa, MD;  Location: [place] [facility] LAB;  Service: Cardiovascular;  Laterality: Left;  lower extremity    Allergies: Ivp dye [iodinated contrast media], Lisinopril, and Solu-medrol [methylprednisolone]  Medications: Prior to Admission medications   Medication Sig Start Date End Date Taking? Authorizing Provider  acetaminophen (TYLENOL) 500 MG tablet Take 1,000 mg by mouth daily as needed (for pain or headaches).    Yes [provider]  amLODipine (NORVASC) 10 MG tablet Take 10 mg by mouth daily at 2 PM.   Yes [provider]  B Complex-C-Folic Acid (DIALYVITE TABLET) TABS Take 1 tablet by mouth daily. 06/29/20  Yes [provider]  cloNIDine (CATAPRES) 0.1 MG tablet Take 1 tablet (0.1 mg total) by mouth at bedtime. 05/04/22  Yes Troy Sine, MD  TUMS ULTRA 1000 1000 MG chewable tablet Chew 4,000 mg by mouth 3 (three) times daily with meals. 03/14/20  Yes [provider]  diphenhydrAMINE (BENADRYL) 50 MG tablet Take 1 tablet (50  mg total) by mouth once for 1 dose. 1 hour prior to test Patient not taking: Reported on 06/27/2022 05/04/22 05/04/22  Troy Sine, MD  metoprolol succinate (TOPROL-XL) 25 MG 24 hr tablet Take 1 tablet (25 mg total) by mouth daily. Take with or immediately following a meal. Patient not taking: Reported on 06/27/2022 05/04/22 04/29/23  Troy Sine, MD  predniSONE (DELTASONE) 50 MG tablet Take 1 tablet by mouth 13 hrs prior to test, another tablet 7 hours prior, and last dose 1 hour prior. Patient not taking: Reported on 06/27/2022 05/04/22   Troy Sine, MD  fluticasone [facility]) 50 MCG/ACT nasal spray Place 2 sprays into both nostrils daily. Patient not taking: Reported on 06/15/2018 07/25/17 02/20/20  Larene Pickett, PA-C     Family History  Problem Relation Age of Onset   Renal Disease Neg Hx     Social History   Socioeconomic History   Marital status: Married    Spouse name: Not on file    Number of children: Not on file   Years of education: Not on file   Highest education level: Not on file  Occupational History   Not on file  Tobacco Use   Smoking status: Former    Types: Cigarettes    Quit date: 03/21/1986    Years since quitting: 36.2   Smokeless tobacco: Never  Vaping Use   Vaping Use: Never used  Substance and Sexual Activity   Alcohol use: No    Alcohol/week: 0.0 standard drinks of alcohol   Drug use: No   Sexual activity: Not on file  Other Topics Concern   Not on file  Social History Narrative   Not on file   Social Determinants of Health   Financial Resource Strain: Not on file  Food Insecurity: Not on file  Transportation Needs: Not on file  Physical Activity: Not on file  Stress: Not on file  Social Connections: Not on file    Review of Systems: A 12 point ROS discussed and pertinent positives are indicated in the HPI above.  All other systems are negative.  Vital Signs: BP 115/79 (BP Location: Left Arm)   Pulse 64   Temp 98.5 F (36.9 C) (Oral)   Resp 18   Ht 6' (1.829 m)   Wt 147 lb (66.7 kg)   SpO2 99%   BMI 19.94 kg/m     Physical Exam Vitals reviewed.  HENT:     Mouth/Throat:     Mouth: Mucous membranes are moist.  Cardiovascular:     Rate and Rhythm: Normal rate and regular rhythm.     Heart sounds: Normal heart sounds.  Pulmonary:     Effort: Pulmonary effort is normal.     Breath sounds: Normal breath sounds.  Abdominal:     Palpations: Abdomen is soft.  Musculoskeletal:        General: Normal range of motion.     Comments: Rt thigh graft No pulse No thrill  Skin:    General: Skin is warm.  Neurological:     Mental Status: He is alert and oriented to person, place, and time.  Psychiatric:        Behavior: Behavior normal.     Imaging: IR Fluoro Guide CV Line Right  Result Date: 06/28/2022 INDICATION: 51 year old with end-stage renal disease and occluded AV graft. Patient needs temporary dialysis  catheter. EXAM: FLUOROSCOPIC AND ULTRASOUND GUIDED PLACEMENT OF A NON-TUNNELED DIALYSIS CATHETER Physician: Stephan Minister. Anselm Pancoast, MD MEDICATIONS:  1% lidocaine for local anesthetic ANESTHESIA/SEDATION: None FLUOROSCOPY TIME:  Radiation Exposure Index (as provided by the fluoroscopic device): 1 mGy Kerma COMPLICATIONS: None immediate. PROCEDURE: The procedure was explained to the patient. The risks and benefits of the procedure were discussed and the patient's questions were addressed. Informed consent was obtained from the patient. The patient was placed supine on the interventional table. Ultrasound confirmed a patent right internal jugular vein. Ultrasound images were obtained for documentation. The right neck was prepped and draped in a sterile fashion. The right neck was anesthetized with 1% lidocaine. Maximal barrier sterile technique was utilized including caps, mask, sterile gowns, sterile gloves, sterile drape, hand hygiene and skin antiseptic. A small incision was made with #11 blade scalpel. A 21 gauge needle directed into the right internal jugular vein with ultrasound guidance. A micropuncture dilator set was placed. A 20 cm Mahurkar catheter was selected. The catheter was advanced over a wire and positioned at the superior cavoatrial junction. Fluoroscopic images were obtained for documentation. Both dialysis lumens were found to aspirate and flush well. The proper amount of heparin was flushed in both lumens. The central venous lumen was flushed with normal saline. Catheter was sutured to skin. Fluoroscopic and ultrasound images were taken and saved for documentation. FINDINGS: Catheter tip at the superior cavoatrial junction. IMPRESSION: Successful placement of a right jugular non-tunneled dialysis catheter using ultrasound and fluoroscopic guidance. Electronically Signed   By: Markus Daft M.D.   On: 06/28/2022 08:19   IR US Guide Vasc Access Right  Result Date: 06/28/2022 INDICATION: 51 year old with  end-stage renal disease and occluded AV graft. Patient needs temporary dialysis catheter. EXAM: FLUOROSCOPIC AND ULTRASOUND GUIDED PLACEMENT OF A NON-TUNNELED DIALYSIS CATHETER Physician: Stephan Minister. Henn, MD MEDICATIONS: 1% lidocaine for local anesthetic ANESTHESIA/SEDATION: None FLUOROSCOPY TIME:  Radiation Exposure Index (as provided by the fluoroscopic device): 1 mGy Kerma COMPLICATIONS: None immediate. PROCEDURE: The procedure was explained to the patient. The risks and benefits of the procedure were discussed and the patient's questions were addressed. Informed consent was obtained from the patient. The patient was placed supine on the interventional table. Ultrasound confirmed a patent right internal jugular vein. Ultrasound images were obtained for documentation. The right neck was prepped and draped in a sterile fashion. The right neck was anesthetized with 1% lidocaine. Maximal barrier sterile technique was utilized including caps, mask, sterile gowns, sterile gloves, sterile drape, hand hygiene and skin antiseptic. A small incision was made with #11 blade scalpel. A 21 gauge needle directed into the right internal jugular vein with ultrasound guidance. A micropuncture dilator set was placed. A 20 cm Mahurkar catheter was selected. The catheter was advanced over a wire and positioned at the superior cavoatrial junction. Fluoroscopic images were obtained for documentation. Both dialysis lumens were found to aspirate and flush well. The proper amount of heparin was flushed in both lumens. The central venous lumen was flushed with normal saline. Catheter was sutured to skin. Fluoroscopic and ultrasound images were taken and saved for documentation. FINDINGS: Catheter tip at the superior cavoatrial junction. IMPRESSION: Successful placement of a right jugular non-tunneled dialysis catheter using ultrasound and fluoroscopic guidance. Electronically Signed   By: Markus Daft M.D.   On: 06/28/2022 08:19   DG Chest 2  View  Result Date: 06/27/2022 CLINICAL DATA:  Shortness of breath. EXAM: CHEST - 2 VIEW COMPARISON:  Chest x-ray 06/12/2022. FINDINGS: The heart size and mediastinal contours are within normal limits. Both lungs are clear. No visible pleural effusions or  pneumothorax. No acute osseous abnormality. IMPRESSION: No evidence of acute cardiopulmonary disease. Electronically Signed   By: Margaretha Sheffield M.D.   On: 06/27/2022 10:45   VAS [place] ABI WITH/WO TBI  Result Date: 06/23/2022  LOWER EXTREMITY DOPPLER STUDY Patient Name:  [name] [facility]  Date of Exam:   06/23/2022 Medical Rec #: [id number]              Accession #:    [id number] Date of Birth: 01/31/1971              Patient Gender: M Patient Age:   84 years Exam Location:  [facility] Procedure:      VAS [place] ABI WITH/WO TBI Referring Phys: Lawson Radar --------------------------------------------------------------------------------  Indications: Pain and tingling of feet High Risk Factors: Hypertension. Other Factors: ESRD, neuropathy.  Comparison Study: No prior study Performing Technologist: Sharion Dove RVS  Examination Guidelines: A complete evaluation includes at minimum, Doppler waveform signals and systolic blood pressure reading at the level of bilateral brachial, anterior tibial, and posterior tibial arteries, when vessel segments are accessible. Bilateral testing is considered an integral part of a complete examination. Photoelectric Plethysmograph (PPG) waveforms and toe systolic pressure readings are included as required and additional duplex testing as needed. Limited examinations for reoccurring indications may be performed as noted.  ABI Findings: +---------+------------------+-----+-----------+--------+ Right    Rt Pressure (mmHg)IndexWaveform   Comment  +---------+------------------+-----+-----------+--------+ Brachial 137                    triphasic            +---------+------------------+-----+-----------+--------+ PTA      155               1.13 multiphasic         +---------+------------------+-----+-----------+--------+ DP       156               1.14 multiphasic         +---------+------------------+-----+-----------+--------+ Great Toe60                0.44                     +---------+------------------+-----+-----------+--------+ +---------+------------------+-----+-----------+---------+ Left     Lt Pressure (mmHg)IndexWaveform   Comment   +---------+------------------+-----+-----------+---------+ Brachial                                   Old graft +---------+------------------+-----+-----------+---------+ PTA      151               1.10 multiphasic          +---------+------------------+-----+-----------+---------+ DP       162               1.18 multiphasic          +---------+------------------+-----+-----------+---------+ Great Toe78                0.57                      +---------+------------------+-----+-----------+---------+ +-------+-----------+-----------+------------+------------+ ABI/TBIToday's ABIToday's TBIPrevious ABIPrevious TBI +-------+-----------+-----------+------------+------------+ Right  1.14       0.44                                +-------+-----------+-----------+------------+------------+ Left   1.18       0.57                                +-------+-----------+-----------+------------+------------+  Summary: Right: Resting right ankle-brachial index is within normal range. The right toe-brachial index is abnormal. Left: Resting left ankle-brachial index is within normal range. The left toe-brachial index is abnormal. *See table(s) above for measurements and observations.  Electronically signed by Monica Martinez MD on 06/23/2022 at 12:38:38 PM.    Final    VAS [place] LOWER EXTREMITY VENOUS (DVT)  Result Date: 06/23/2022  Lower Venous DVT Study Patient Name:   [name]  Date of Exam:   06/23/2022 Medical Rec #: [id number]              Accession #:    [id number] Date of Birth: 05-02-1971              Patient Gender: M Patient Age:   30 years Exam [facility]  [facility] Procedure:      VAS [place] LOWER EXTREMITY VENOUS (DVT) Referring Phys: Lawson Radar --------------------------------------------------------------------------------  Indications: Numbness and tingling of left foot.  Comparison Study: No prior left LEV Performing Technologist: Sharion Dove RVS  Examination Guidelines: A complete evaluation includes B-mode imaging, spectral Doppler, color Doppler, and power Doppler as needed of all accessible portions of each vessel. Bilateral testing is considered an integral part of a complete examination. Limited examinations for reoccurring indications may be performed as noted. The reflux portion of the exam is performed with the patient in reverse Trendelenburg.  +-----+---------------+---------+-----------+----------+--------------+ RIGHTCompressibilityPhasicitySpontaneityPropertiesThrombus Aging +-----+---------------+---------+-----------+----------+--------------+ CFV  Full           Yes      Yes                                 +-----+---------------+---------+-----------+----------+--------------+ Incidental finding: Right lower extremity dialysis access is thrombosed  +---------+---------------+---------+-----------+----------+--------------+ LEFT     CompressibilityPhasicitySpontaneityPropertiesThrombus Aging +---------+---------------+---------+-----------+----------+--------------+ CFV      Full           Yes      Yes                                 +---------+---------------+---------+-----------+----------+--------------+ SFJ      Full                                                        +---------+---------------+---------+-----------+----------+--------------+ FV Prox  Full                                                         +---------+---------------+---------+-----------+----------+--------------+ FV Mid   Full                                                        +---------+---------------+---------+-----------+----------+--------------+ FV DistalFull                                                        +---------+---------------+---------+-----------+----------+--------------+  PFV      Full                                                        +---------+---------------+---------+-----------+----------+--------------+ POP      Full           Yes      Yes                                 +---------+---------------+---------+-----------+----------+--------------+ PTV      Full                                                        +---------+---------------+---------+-----------+----------+--------------+ PERO     Full                                                        +---------+---------------+---------+-----------+----------+--------------+     Summary: RIGHT: - No evidence of common femoral vein obstruction. 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  LEFT: - There is no evidence of deep vein thrombosis in the lower extremity.  *See table(s) above for measurements and observations. Electronically signed by Monica Martinez MD on 06/23/2022 at 12:38:24 PM.    Final    CT Head Wo Contrast  Result Date: 06/12/2022 CLINICAL DATA:  BLUNT POLY TRAUMA, DIZZINESS AND CHEST PAIN, HEADACHE FOR 2 WEEKS. EXAM: CT HEAD WITHOUT CONTRAST TECHNIQUE: Contiguous axial images were obtained from the base of the skull through the vertex without intravenous contrast. RADIATION DOSE REDUCTION: This exam was performed according to the departmental dose-optimization program which includes automated exposure control, adjustment of the mA and/or kV according to patient size and/or use of iterative reconstruction technique. COMPARISON:  HEAD CT 03/27/2023 FINDINGS: Brain: There is  mild global atrophy including centrally but it is greater than typically seen in a 50 year old. The gray-white matter differentiation and attenuation are normal. No infarct, hemorrhage or mass are seen. Ventricles are normal in size and position. Basal cisterns are clear. Vascular: There are scattered calcifications of the carotid siphons. There are no hyperdense central vessels. Skull: Negative for fracture or focal lesion. No scalp hematoma is seen. Sinuses/Orbits: No acute abnormality. Unremarkable orbital contents. 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 IMPRESSION: No acute intracranial CT findings, depressed skull fractures or interval changes. Electronically Signed   By: Telford Nab M.D.   On: 06/12/2022 07:27   DG Chest Portable 1 View  Result Date: 06/12/2022 CLINICAL DATA:  Short of breath with dizziness. EXAM: PORTABLE CHEST 1 VIEW COMPARISON:  03/28/2022 FINDINGS: Heart size and mediastinal contours are unremarkable. There is no pleural effusion or edema identified. No airspace opacities identified. IMPRESSION: No active disease. Electronically Signed   By: Kerby Moors M.D.   On: 06/12/2022 06:41    Labs:  CBC: Recent Labs    06/11/22 1826 06/24/22 1327 06/27/22 1029 06/28/22 0332  WBC 4.6 9.4 4.2 6.1  HGB 14.3 13.3 13.8 14.0  HCT 43.1 39.7 41.4 39.7  PLT 200 181 229 256    COAGS: Recent Labs    03/26/22 1755  INR 1.0  APTT 33    BMP: Recent Labs    06/24/22 1327 06/27/22 1029 06/27/22 2023 06/28/22 0332  NA 137 138 138 135  K 5.2* 6.4* 6.1* 4.3  CL 96* 93* 90* 89*  CO2 24 15* 18* 27  GLUCOSE 92 117* 110* 203*  BUN 66* 113* 124* 47*  CALCIUM 8.2* 8.4* 8.2* 8.8*  CREATININE 15.20* 23.45* 24.66* 13.53*  GFRNONAA 4* 2* 2* 4*    LIVER FUNCTION TESTS: Recent Labs     07/11/21 0555 03/26/22 1755 06/27/22 1029 06/27/22 2023  BILITOT 0.5 0.8 0.8  --   AST 18 23 13*  --   ALT 10 16 14   --   ALKPHOS 71 50 65  --   PROT 8.5* 7.6 9.1*  --   ALBUMIN 3.3* 3.8 4.2 3.7    TUMOR MARKERS: No results for input(s): "AFPTM", "CEA", "CA199", "CHROMGRNA" in the last 8760 hours.  Assessment and Plan:  Scheduled for right thigh dialysis graft evaluation and intervention in IR tomorrow Possible tunneled dialysis catheter placement Risks and benefits discussed with the patient including, but not limited to bleeding, infection, vascular injury, pulmonary embolism, need for tunneled HD catheter placement or even death.  All of the patient's questions were answered, patient is agreeable to proceed. Consent signed and in chart.   Thank you for this interesting consult.  I greatly enjoyed meeting [name] and look forward to participating in their care.  A copy of this report was sent to the requesting provider on this date.  Electronically Signed: Lavonia Drafts, PA-C 06/28/2022, 2:40 PM   I spent a total of 20 Minutes    in face to face in clinical consultation, greater than 50% of which was counseling/coordinating care for right thigh dialysis graft declot/poss tunneled dialysis catheter placement

## 2022-06-28 NOTE — Care Management (Signed)
  Transition of Care (TOC) Screening Note   Patient Details  Name: [name] Date of Birth: [birth year]   Transition of Care [facility]) CM/SW Contact:    Carles Collet, RN Phone Number: 06/28/2022, 3:39 PM    Transition of Care Department [facility]) has reviewed patient and we will continue to monitor patient advancement through interdisciplinary progression rounds.   Anticipate DC to home after HD access declotted.

## 2022-06-28 NOTE — Progress Notes (Addendum)
PROGRESS NOTE    [name]  DPO:[id number] DOB: 12-19-1970 DOA: 06/27/2022 PCP: Patient, No Pcp Per   Chief Complaint  Patient presents with   Vascular Access Problem    Brief Narrative:   [name] is a 51 y.o. 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 Patient last HD was last Wednesday.  Friday, patient went to dialysis when it was found patient's AV shunt clogged, HD session canceled.  Same day, patient came to ED for the first time, but he was evaluated and discussion among nephrology and IR, recommend the patient come in Monday for IR intervention.  Patient came to IR , where he was sent to ED for expedited IR intervention.  Patient no longer makes urine, he has intentionally reduced oral intake since Friday to avoid fluid overload or hyperkalemia, and has been feeling generalized weakness for the last 2 to 3 days, denies any chest pain shortness of breath cough swelling. In ED his blood pressure was elevated, and K was elevated at 6.4, bicarb low at 15, he received Lokelma, IR had placed temporary catheter and he received emergent dialysis.     Assessment & Plan:   Principal Problem:   Hyperkalemia Active Problems:   ESRD (end stage renal disease) on dialysis (HCC)   Problem with vascular access   Essential hypertension   Thyroid disease     Hyperkalemia -Secondary to malfunction/clotted right thigh AV fistula and missed dialysis x2.  -He received HD yesterday, potassium is corrected today.   Hypertension, uncontrolled -continue home BP regimen including amlodipine clonidine and metoprolol   Chronic HFrEF -Volume management with HD  ESRD on HD -Missed dialysis x2, currently has temporary catheter started by IR yesterday, he was dialyzed yesterday, he will be dialyzed again  today.  Clotted AVG -Plan to declot by IR on Wednesday -he is on prednisone currently due to contrast allergy   DVT prophylaxis:  Code Status: Full Family Communication: None at bedside Disposition:   Status is: Inpatient    Consultants:  Renal IR   Subjective:  He denies any complaints today Objective: Vitals:   06/28/22 0130 06/28/22 0344 06/28/22 0757 06/28/22 1227  BP: (!) 141/95 103/82 121/87 115/79  Pulse: (!) 58 69 (!) 58 64  Resp: 16 16 16 18   Temp: 97.7 F (36.5 C) 97.8 F (36.6 C) 98.4 F (36.9 C) 98.5 F (36.9 C)  TempSrc: Oral Oral Oral Oral  SpO2: 96% 97% 100% 99%  Weight:      Height:        Intake/Output Summary (Last 24 hours) at 06/28/2022 1344 Last data filed at 06/28/2022 1249 Gross per 24 hour  Intake 240 ml  Output 2.5 ml  Net 237.5 ml   Filed Weights   06/27/22 1027  Weight: 66.7 kg    Examination:  Awake Alert, Oriented X 3, No new F.N deficits, Normal affect Right IJ temp HD cath Symmetrical Chest wall movement, Good air movement bilaterally, CTAB RRR,No Gallops,Rubs or new Murmurs, No Parasternal Heave +ve B.Sounds, Abd Soft, No tenderness, No rebound - guarding or rigidity. No Cyanosis, Clubbing or edema, No new Rash or bruise      Data Reviewed: I have personally reviewed following labs and imaging studies  CBC: Recent Labs  Lab 06/24/22 1327 06/27/22 1029 06/28/22 0332  WBC 9.4 4.2 6.1  NEUTROABS  7.1 4.0  --   HGB 13.3 13.8 14.0  HCT 39.7 41.4 39.7  MCV 92.8 93.9 89.6  PLT 181 229 161    Basic Metabolic Panel: Recent Labs  Lab 06/24/22 1327 06/27/22 1029 06/27/22 2023 06/28/22 0332  NA 137 138 138 135  K 5.2* 6.4* 6.1* 4.3  CL 96* 93* 90* 89*  CO2 24 15* 18* 27  GLUCOSE 92 117* 110* 203*  BUN 66* 113* 124* 47*  CREATININE 15.20* 23.45* 24.66* 13.53*  CALCIUM 8.2* 8.4* 8.2* 8.8*  PHOS  --   --  6.8*  --     GFR: Estimated Creatinine Clearance: 6.2 mL/min (A) (by C-G formula based on SCr of 13.53  mg/dL (H)).  Liver Function Tests: Recent Labs  Lab 06/27/22 1029 06/27/22 2023  AST 13*  --   ALT 14  --   ALKPHOS 65  --   BILITOT 0.8  --   PROT 9.1*  --   ALBUMIN 4.2 3.7    CBG: No results for input(s): "GLUCAP" in the last 168 hours.   No results found for this or any previous visit (from the past 240 hour(s)).       Radiology Studies: IR Fluoro Guide CV Line Right  Result Date: 06/28/2022 INDICATION: 51 year old with end-stage renal disease and occluded AV graft. Patient needs temporary dialysis catheter. EXAM: FLUOROSCOPIC AND ULTRASOUND GUIDED PLACEMENT OF A NON-TUNNELED DIALYSIS CATHETER Physician: Stephan Minister. Henn, MD MEDICATIONS: 1% lidocaine for local anesthetic ANESTHESIA/SEDATION: None FLUOROSCOPY TIME:  Radiation Exposure Index (as provided by the fluoroscopic device): 1 mGy Kerma COMPLICATIONS: None immediate. PROCEDURE: The procedure was explained to the patient. The risks and benefits of the procedure were discussed and the patient's questions were addressed. Informed consent was obtained from the patient. The patient was placed supine on the interventional table. Ultrasound confirmed a patent right internal jugular vein. Ultrasound images were obtained for documentation. The right neck was prepped and draped in a sterile fashion. The right neck was anesthetized with 1% lidocaine. Maximal barrier sterile technique was utilized including caps, mask, sterile gowns, sterile gloves, sterile drape, hand hygiene and skin antiseptic. A small incision was made with #11 blade scalpel. A 21 gauge needle directed into the right internal jugular vein with ultrasound guidance. A micropuncture dilator set was placed. A 20 cm Mahurkar catheter was selected. The catheter was advanced over a wire and positioned at the superior cavoatrial junction. Fluoroscopic images were obtained for documentation. Both dialysis lumens were found to aspirate and flush well. The proper amount of heparin was  flushed in both lumens. The central venous lumen was flushed with normal saline. Catheter was sutured to skin. Fluoroscopic and ultrasound images were taken and saved for documentation. FINDINGS: Catheter tip at the superior cavoatrial junction. IMPRESSION: Successful placement of a right jugular non-tunneled dialysis catheter using ultrasound and fluoroscopic guidance. Electronically Signed   By: Markus Daft M.D.   On: 06/28/2022 08:19   IR US Guide Vasc Access Right  Result Date: 06/28/2022 INDICATION: 51 year old with end-stage renal disease and occluded AV graft. Patient needs temporary dialysis catheter. EXAM: FLUOROSCOPIC AND ULTRASOUND GUIDED PLACEMENT OF A NON-TUNNELED DIALYSIS CATHETER Physician: Stephan Minister. Henn, MD MEDICATIONS: 1% lidocaine for local anesthetic ANESTHESIA/SEDATION: None FLUOROSCOPY TIME:  Radiation Exposure Index (as provided by the fluoroscopic device): 1 mGy Kerma COMPLICATIONS: None immediate. PROCEDURE: The procedure was explained to the patient. The risks and benefits of the procedure were discussed and the patient's questions were addressed. Informed consent was  obtained from the patient. The patient was placed supine on the interventional table. Ultrasound confirmed a patent right internal jugular vein. Ultrasound images were obtained for documentation. The right neck was prepped and draped in a sterile fashion. The right neck was anesthetized with 1% lidocaine. Maximal barrier sterile technique was utilized including caps, mask, sterile gowns, sterile gloves, sterile drape, hand hygiene and skin antiseptic. A small incision was made with #11 blade scalpel. A 21 gauge needle directed into the right internal jugular vein with ultrasound guidance. A micropuncture dilator set was placed. A 20 cm Mahurkar catheter was selected. The catheter was advanced over a wire and positioned at the superior cavoatrial junction. Fluoroscopic images were obtained for documentation. Both dialysis  lumens were found to aspirate and flush well. The proper amount of heparin was flushed in both lumens. The central venous lumen was flushed with normal saline. Catheter was sutured to skin. Fluoroscopic and ultrasound images were taken and saved for documentation. FINDINGS: Catheter tip at the superior cavoatrial junction. IMPRESSION: Successful placement of a right jugular non-tunneled dialysis catheter using ultrasound and fluoroscopic guidance. Electronically Signed   By: Markus Daft M.D.   On: 06/28/2022 08:19   DG Chest 2 View  Result Date: 06/27/2022 CLINICAL DATA:  Shortness of breath. EXAM: CHEST - 2 VIEW COMPARISON:  Chest x-ray 06/12/2022. FINDINGS: The heart size and mediastinal contours are within normal limits. Both lungs are clear. No visible pleural effusions or pneumothorax. No acute osseous abnormality. IMPRESSION: No evidence of acute cardiopulmonary disease. Electronically Signed   By: Margaretha Sheffield M.D.   On: 06/27/2022 10:45        Scheduled Meds:  amLODipine  10 mg Oral Q1400   calcium carbonate  4,000 mg Oral TID WC   Chlorhexidine Gluconate Cloth  6 each Topical Q0600   cloNIDine  0.1 mg Oral QHS   heparin  5,000 Units Subcutaneous Q8H   sodium bicarbonate  1,300 mg Oral BID   Continuous Infusions:  anticoagulant sodium citrate       LOS: 1 day        Phillips Climes, MD [facility]   To contact the attending provider between 7A-7P or the covering provider during after hours 7P-7A, please log into the web site [email or web address] and access using universal [place] password for that web site. If you do not have the password, please call the hospital operator.  06/28/2022, 1:44 PM

## 2022-06-28 NOTE — Progress Notes (Signed)
Pt receives out-pt HD at [facility] on MWF. Will assist as needed.   Joniece Smotherman Renal Navigator [phone] 

## 2022-06-28 NOTE — ED Notes (Addendum)
   06/28/22 0035  Vitals  Temp 97.8 F (36.6 C)  Temp Source Oral  BP 135/80  MAP (mmHg) 96  BP Location Right Arm  BP Method Automatic  Patient Position (if appropriate) Lying  Pulse Rate 70  Pulse Rate Source Monitor  ECG Heart Rate 67  Resp 14  Oxygen Therapy  SpO2 97 %  O2 Device Room Air  Post Treatment  Dialyzer Clearance Lightly streaked  Duration of HD Treatment -hour(s) 3.75 hour(s)  Hemodialysis Intake (mL) 700 mL  Liters Processed 78.3  Fluid Removed 2.5 mL  Tolerated HD Treatment Yes  Post-Hemodialysis Comments tx complete, pt stable, high arterial pressures noted throughout tx, however able to complete tx, UF within goal. No adverse reactions. Vital signs stable.  Hemodialysis Catheter Right Internal jugular Triple lumen Temporary (Non-Tunneled)  Placement Date/Time: 06/27/22 1828   Placed prior to admission: No  Serial / Lot #: [id number]  Expiration Date: 04/02/26  Time Out: Correct patient;Correct site;Correct procedure  Maximum sterile barrier precautions: Hand hygiene;Cap;Mask;Sterile gow...  Site Condition No complications  Blue Lumen Status Flushed;Heparin locked;Dead end cap in place  Red Lumen Status Flushed;Heparin locked;Dead end cap in place  Purple Lumen Status Saline locked  Catheter fill solution Heparin 1000 units/ml  Catheter fill volume (Arterial) 1.4 cc  Catheter fill volume (Venous) 1.4  Dressing Type Transparent;Gauze/Drain sponge  Dressing Status Antimicrobial disc in place  Dressing Change Due 06/28/22  Post treatment catheter status Capped and Clamped   HD complete, pt stable.

## 2022-06-28 NOTE — Progress Notes (Signed)
Rcvd a call from core lab [facility]) regarding a critical lab that was resulted 09/25; K+ at 6.4. Primary RN Christen Bame is on lunch and unable to take call for critical. Endorsed to primary RN devin for further follow up. However, patient's K+ today 09/26 is 4.3; additionally, it appears that the K+ was addressed by provider per H&P. Will informed primary RN and MD ([email or web address]) for further follow up.

## 2022-06-28 NOTE — Progress Notes (Signed)
[facility] Progress Note   Subjective: Seen in room. No C/Os. Had HD 09/25 via Temp cath. Going to declot of thigh AVG 06/29/2022.   Objective Vitals:   06/28/22 0130 06/28/22 0344 06/28/22 0757 06/28/22 1227  BP: (!) 141/95 103/82 121/87 115/79  Pulse: (!) 58 69 (!) 58 64  Resp: 16 16 16 18   Temp: 97.7 F (36.5 C) 97.8 F (36.6 C) 98.4 F (36.9 C) 98.5 F (36.9 C)  TempSrc: Oral Oral Oral Oral  SpO2: 96% 97% 100% 99%  Weight:      Height:       Physical Exam General: Pleasant, NAD Heart: RRR Lungs: No WOB. CTAB Abdomen:NABs, NT, ND Extremities: No LE edema Dialysis Access: Right thigh AVG +T/B RIJ Temp cath drsg intact  Additional Objective Labs: Basic Metabolic Panel: Recent Labs  Lab 06/27/22 1029 06/27/22 2023 06/28/22 0332  NA 138 138 135  K 6.4* 6.1* 4.3  CL 93* 90* 89*  CO2 15* 18* 27  GLUCOSE 117* 110* 203*  BUN 113* 124* 47*  CREATININE 23.45* 24.66* 13.53*  CALCIUM 8.4* 8.2* 8.8*  PHOS  --  6.8*  --    Liver Function Tests: Recent Labs  Lab 06/27/22 1029 06/27/22 2023  AST 13*  --   ALT 14  --   ALKPHOS 65  --   BILITOT 0.8  --   PROT 9.1*  --   ALBUMIN 4.2 3.7   No results for input(s): "LIPASE", "AMYLASE" in the last 168 hours. CBC: Recent Labs  Lab 06/24/22 1327 06/27/22 1029 06/28/22 0332  WBC 9.4 4.2 6.1  NEUTROABS 7.1 4.0  --   HGB 13.3 13.8 14.0  HCT 39.7 41.4 39.7  MCV 92.8 93.9 89.6  PLT 181 229 256   Blood Culture    Component Value Date/Time   SDES  05/27/2021 0348    BLOOD BLOOD RIGHT FOREARM Performed at [facility], [place] [address], [place], [place] [address]    SDES  05/27/2021 [phone]    BLOOD BLOOD RIGHT HAND Performed at [facility], [place] [address] [place], [place] [address]    SPECREQUEST  05/27/2021 0348    BOTTLES DRAWN AEROBIC AND ANAEROBIC Blood Culture adequate volume Performed at [facility] [address]  [place], [place] [address]    SPECREQUEST  05/27/2021 0348    BOTTLES DRAWN AEROBIC AND ANAEROBIC Blood Culture adequate volume Performed at [facility] [address], [place], [place] [address]    CULT  05/27/2021 0348    NO GROWTH 5 DAYS Performed at [facility], [place] [address] [place], [place] [address]    CULT  05/27/2021 0348    NO GROWTH 5 DAYS Performed at [facility], [place] [address], [place], [place] [address]    REPTSTATUS 06/01/2021 FINAL 05/27/2021 0348   REPTSTATUS 06/01/2021 FINAL 05/27/2021 0348    Cardiac Enzymes: No results for input(s): "CKTOTAL", "CKMB", "CKMBINDEX", "TROPONINI" in the last 168 hours. CBG: No results for input(s): "GLUCAP" in the last 168 hours. Iron Studies: No results for input(s): "IRON", "TIBC", "TRANSFERRIN", "FERRITIN" in the last 72 hours. @lablastinr3 @ Studies/Results: IR Fluoro Guide CV Line Right  Result Date: 06/28/2022 INDICATION: 51 year old with end-stage renal disease and occluded AV graft. Patient needs temporary dialysis catheter. EXAM: FLUOROSCOPIC AND ULTRASOUND GUIDED PLACEMENT OF A NON-TUNNELED DIALYSIS CATHETER Physician: Stephan Minister. Anselm Pancoast, MD MEDICATIONS: 1% lidocaine for local anesthetic ANESTHESIA/SEDATION: None FLUOROSCOPY TIME:  Radiation Exposure  Index (as provided by the fluoroscopic device): 1 mGy Kerma COMPLICATIONS: None immediate. PROCEDURE: The procedure was explained to the patient. The risks and benefits of the procedure were discussed and the patient's questions were addressed. Informed consent was obtained from the patient. The patient was placed supine on the interventional table. Ultrasound confirmed a patent right internal jugular vein. Ultrasound images were obtained for documentation. The right neck was prepped and draped in a sterile fashion. The right neck was anesthetized with 1% lidocaine. Maximal barrier sterile technique was utilized including caps, mask, sterile gowns, sterile  gloves, sterile drape, hand hygiene and skin antiseptic. A small incision was made with #11 blade scalpel. A 21 gauge needle directed into the right internal jugular vein with ultrasound guidance. A micropuncture dilator set was placed. A 20 cm Mahurkar catheter was selected. The catheter was advanced over a wire and positioned at the superior cavoatrial junction. Fluoroscopic images were obtained for documentation. Both dialysis lumens were found to aspirate and flush well. The proper amount of heparin was flushed in both lumens. The central venous lumen was flushed with normal saline. Catheter was sutured to skin. Fluoroscopic and ultrasound images were taken and saved for documentation. FINDINGS: Catheter tip at the superior cavoatrial junction. IMPRESSION: Successful placement of a right jugular non-tunneled dialysis catheter using ultrasound and fluoroscopic guidance. Electronically Signed   By: Markus Daft M.D.   On: 06/28/2022 08:19   IR US Guide Vasc Access Right  Result Date: 06/28/2022 INDICATION: 51 year old with end-stage renal disease and occluded AV graft. Patient needs temporary dialysis catheter. EXAM: FLUOROSCOPIC AND ULTRASOUND GUIDED PLACEMENT OF A NON-TUNNELED DIALYSIS CATHETER Physician: Stephan Minister. Henn, MD MEDICATIONS: 1% lidocaine for local anesthetic ANESTHESIA/SEDATION: None FLUOROSCOPY TIME:  Radiation Exposure Index (as provided by the fluoroscopic device): 1 mGy Kerma COMPLICATIONS: None immediate. PROCEDURE: The procedure was explained to the patient. The risks and benefits of the procedure were discussed and the patient's questions were addressed. Informed consent was obtained from the patient. The patient was placed supine on the interventional table. Ultrasound confirmed a patent right internal jugular vein. Ultrasound images were obtained for documentation. The right neck was prepped and draped in a sterile fashion. The right neck was anesthetized with 1% lidocaine. Maximal barrier  sterile technique was utilized including caps, mask, sterile gowns, sterile gloves, sterile drape, hand hygiene and skin antiseptic. A small incision was made with #11 blade scalpel. A 21 gauge needle directed into the right internal jugular vein with ultrasound guidance. A micropuncture dilator set was placed. A 20 cm Mahurkar catheter was selected. The catheter was advanced over a wire and positioned at the superior cavoatrial junction. Fluoroscopic images were obtained for documentation. Both dialysis lumens were found to aspirate and flush well. The proper amount of heparin was flushed in both lumens. The central venous lumen was flushed with normal saline. Catheter was sutured to skin. Fluoroscopic and ultrasound images were taken and saved for documentation. FINDINGS: Catheter tip at the superior cavoatrial junction. IMPRESSION: Successful placement of a right jugular non-tunneled dialysis catheter using ultrasound and fluoroscopic guidance. Electronically Signed   By: Markus Daft M.D.   On: 06/28/2022 08:19   DG Chest 2 View  Result Date: 06/27/2022 CLINICAL DATA:  Shortness of breath. EXAM: CHEST - 2 VIEW COMPARISON:  Chest x-ray 06/12/2022. FINDINGS: The heart size and mediastinal contours are within normal limits. Both lungs are clear. No visible pleural effusions or pneumothorax. No acute osseous abnormality. IMPRESSION: No evidence of acute cardiopulmonary disease.  Electronically Signed   By: Margaretha Sheffield M.D.   On: 06/27/2022 10:45   Medications:  anticoagulant sodium citrate      amLODipine  10 mg Oral Q1400   calcium carbonate  4,000 mg Oral TID WC   Chlorhexidine Gluconate Cloth  6 each Topical Q0600   cloNIDine  0.1 mg Oral QHS   heparin  5,000 Units Subcutaneous Q8H   sodium bicarbonate  1,300 mg Oral BID     HD orders: [facility] MWF 4 hrs 180NRe 450/autoflow 1.5 67.5 kg 2.0 K/ 2.5 Ca UFP 4 AVG -Heparin 1500 units IV TIW  Assessment/Plan  Clotted AVG: needs HD today- temp cath  with IR then declot for Wednesday Hyperkalemia: medical treatment and then HD today and likely off schedule tomorrow ESRD: on MWF schedule normally--> HD today Hep B s Ag positive- Hep B isolation HTN: BP controlled on home medications,  no LE edema. UF as tolerated.  Anemia: no ESA needed BMM: no VDRA, binders when eating Dispo: being admitted   [place]. Stevens Magwood NP-C 06/28/2022, 1:04 PM  [employer] [phone]

## 2022-06-29 ENCOUNTER — Ambulatory Visit (HOSPITAL_COMMUNITY)
Admission: RE | Admit: 2022-06-29 | Discharge: 2022-06-29 | Disposition: A | Discharge status: Home / Self Care | Payer: Self-pay | Source: Ambulatory Visit | Attending: Nephrology | Admitting: Nephrology

## 2022-06-29 VITALS — BP 127/88 | HR 68 | Resp 14

## 2022-06-29 DIAGNOSIS — N186 End stage renal disease: Secondary | ICD-10-CM | POA: Insufficient documentation

## 2022-06-29 DIAGNOSIS — Y841 Kidney dialysis as the cause of abnormal reaction of the patient, or of later complication, without mention of misadventure at the time of the procedure: Secondary | ICD-10-CM | POA: Insufficient documentation

## 2022-06-29 DIAGNOSIS — T82868A Thrombosis of vascular prosthetic devices, implants and grafts, initial encounter: Secondary | ICD-10-CM | POA: Insufficient documentation

## 2022-06-29 DIAGNOSIS — E875 Hyperkalemia: Secondary | ICD-10-CM

## 2022-06-29 DIAGNOSIS — Z992 Dependence on renal dialysis: Secondary | ICD-10-CM | POA: Insufficient documentation

## 2022-06-29 HISTORY — PX: IR US GUIDE VASC ACCESS RIGHT: IMG2390

## 2022-06-29 HISTORY — PX: IR THROMBECTOMY AV FISTULA W/THROMBOLYSIS/PTA INC/SHUNT/IMG RIGHT: IMG6119

## 2022-06-29 HISTORY — PX: IR AV DIALY SHUNT INTRO NEEDLE/INTRACATH INITIAL W/PTA/IMG RIGHT: IMG6116

## 2022-06-29 LAB — BASIC METABOLIC PANEL
Anion gap: 18 — ABNORMAL HIGH (ref 5–15)
BUN: 70 mg/dL — ABNORMAL HIGH (ref 6–20)
CO2: 23 mmol/L (ref 22–32)
Calcium: 7.7 mg/dL — ABNORMAL LOW (ref 8.9–10.3)
Chloride: 94 mmol/L — ABNORMAL LOW (ref 98–111)
Creatinine, Ser: 15.13 mg/dL — ABNORMAL HIGH (ref 0.61–1.24)
GFR, Estimated: 4 mL/min — ABNORMAL LOW (ref >60)
Glucose, Bld: 132 mg/dL — ABNORMAL HIGH (ref 70–99)
Potassium: 6.5 mmol/L (ref 3.5–5.1)
Sodium: 135 mmol/L (ref 135–145)

## 2022-06-29 LAB — CBC
HCT: 40.6 % (ref 39.0–52.0)
Hemoglobin: 13.6 g/dL (ref 13.0–17.0)
MCH: 31.2 pg (ref 26.0–34.0)
MCHC: 33.5 g/dL (ref 30.0–36.0)
MCV: 93.1 fL (ref 80.0–100.0)
Platelets: 222 10*3/uL (ref 150–400)
RBC: 4.36 MIL/uL (ref 4.22–5.81)
RDW: 14 % (ref 11.5–15.5)
WBC: 6.2 10*3/uL (ref 4.0–10.5)
nRBC: 0 % (ref 0.0–0.2)

## 2022-06-29 LAB — POTASSIUM: Potassium: 4.9 mmol/L (ref 3.5–5.1)

## 2022-06-29 LAB — PROTIME-INR
INR: 0.9 (ref 0.8–1.2)
Prothrombin Time: 12.4 seconds (ref 11.4–15.2)

## 2022-06-29 LAB — HEMOGLOBIN AND HEMATOCRIT, BLOOD
HCT: 41.8 % (ref 39.0–52.0)
Hemoglobin: 14.7 g/dL (ref 13.0–17.0)

## 2022-06-29 MED ORDER — FENTANYL CITRATE (PF) 100 MCG/2ML IJ SOLN
INTRAMUSCULAR | Status: AC
  Filled 2022-06-29: qty 2

## 2022-06-29 MED ORDER — CALCIUM GLUCONATE-NACL 1-0.675 GM/50ML-% IV SOLN
1.0000 g | Freq: Once | INTRAVENOUS | Status: AC
  Administered 2022-06-29: 1000 mg via INTRAVENOUS
  Filled 2022-06-29: qty 50

## 2022-06-29 MED ORDER — ALTEPLASE 2 MG IJ SOLR
INTRAMUSCULAR | Status: AC | PRN
  Administered 2022-06-29: 8 mg

## 2022-06-29 MED ORDER — IOHEXOL 300 MG/ML  SOLN
100.0000 mL | Freq: Once | INTRAMUSCULAR | Status: AC | PRN
  Administered 2022-06-29: 15 mL via INTRAVENOUS

## 2022-06-29 MED ORDER — MIDAZOLAM HCL 2 MG/2ML IJ SOLN
INTRAMUSCULAR | Status: AC
  Filled 2022-06-29: qty 2

## 2022-06-29 MED ORDER — FENTANYL CITRATE (PF) 100 MCG/2ML IJ SOLN
INTRAMUSCULAR | Status: AC | PRN
  Administered 2022-06-29: 50 ug via INTRAVENOUS
  Administered 2022-06-29 (×3): 25 ug via INTRAVENOUS

## 2022-06-29 MED ORDER — HEPARIN SODIUM (PORCINE) 1000 UNIT/ML IJ SOLN
INTRAMUSCULAR | Status: AC | PRN
  Administered 2022-06-29: 3000 [IU] via INTRAVENOUS

## 2022-06-29 MED ORDER — MIDAZOLAM HCL 2 MG/2ML IJ SOLN
INTRAMUSCULAR | Status: AC | PRN
  Administered 2022-06-29: 1 mg via INTRAVENOUS
  Administered 2022-06-29 (×3): .5 mg via INTRAVENOUS

## 2022-06-29 MED ORDER — DEXTROSE 50 % IV SOLN
1.0000 | Freq: Once | INTRAVENOUS | Status: AC
  Administered 2022-06-29: 50 mL via INTRAVENOUS
  Filled 2022-06-29: qty 50

## 2022-06-29 MED ORDER — IOHEXOL 300 MG/ML  SOLN
100.0000 mL | Freq: Once | INTRAMUSCULAR | Status: AC | PRN
  Administered 2022-06-29: 70 mL via INTRAVENOUS

## 2022-06-29 MED ORDER — LACTATED RINGERS IV BOLUS
250.0000 mL | Freq: Once | INTRAVENOUS | Status: AC
  Administered 2022-06-29: 250 mL via INTRAVENOUS

## 2022-06-29 MED ORDER — LIDOCAINE HCL 1 % IJ SOLN
INTRAMUSCULAR | Status: AC
  Filled 2022-06-29: qty 20

## 2022-06-29 MED ORDER — ALTEPLASE 2 MG IJ SOLR
INTRAMUSCULAR | Status: AC
  Filled 2022-06-29: qty 8

## 2022-06-29 MED ORDER — HEPARIN SODIUM (PORCINE) 1000 UNIT/ML IJ SOLN
INTRAMUSCULAR | Status: AC
  Filled 2022-06-29: qty 10

## 2022-06-29 MED ORDER — SODIUM ZIRCONIUM CYCLOSILICATE 10 G PO PACK
10.0000 g | PACK | Freq: Once | ORAL | Status: AC
  Administered 2022-06-29: 10 g via ORAL
  Filled 2022-06-29: qty 1

## 2022-06-29 MED ORDER — SODIUM BICARBONATE 8.4 % IV SOLN
50.0000 meq | Freq: Once | INTRAVENOUS | Status: AC
  Administered 2022-06-29: 50 meq via INTRAVENOUS
  Filled 2022-06-29: qty 50

## 2022-06-29 MED ORDER — INSULIN ASPART 100 UNIT/ML IV SOLN
5.0000 [IU] | Freq: Once | INTRAVENOUS | Status: AC
  Administered 2022-06-29: 5 [IU] via INTRAVENOUS

## 2022-06-29 NOTE — Progress Notes (Signed)
Consult to check temp HD cath: Arrived to unit. Pt off the floor to dialysis. Dialysis RN to manage HD cath while on the unit. Message to RN.

## 2022-06-29 NOTE — Progress Notes (Signed)
IV consult w/note to remove HD IJ.  This RN spoke with primary RN, Devin.  Pt also has a graft/fistula.Currently in dialysis and anticipated to be discharged 06/30/22.  Advised Devin a central d/c order will need to be placed and recommendation is a couple hours prior to d/c time.

## 2022-06-29 NOTE — Progress Notes (Addendum)
Received patient in bed to unit.  Alert and oriented.  Informed consent signed and in chart.   Treatment initiated: 4932 Treatment completed: 1837  Patient tolerated well.  Transported back to the room  Alert, without acute distress.  Hand-off given to patient's nurse.   Access used: AVG Access issues: none  Total UF removed: 2.5 L Medication(s) given: none Post HD VS: 117/81 P 90 R 16 O2 sat 97 in room air. Post HD weight: 64.2 kg   [facility]

## 2022-06-29 NOTE — Progress Notes (Addendum)
HOSPITAL MEDICINE OVERNIGHT EVENT NOTE    Notified by nursing the patient's potassium is 6.5.  An attempt was made for patient undergo dialysis yesterday evening but unfortunately due to a nonfunctioning hemodialysis catheter patient was only able to receive 30 minutes of dialysis.  Plan is for this to be rectified today by the day team.  Currently, with potassium of 6.5 we obtained a stat EKG which reveals minimal peaking of the T waves and a normal QRS complex width.  We will administer dextrose, insulin, sodium bicarbonate, calcium gluconate and Lokelma.  We will repeat potassium level in 4 hours.  Continue to monitor closely on telemetry.  Anthony Emerald  MD [facility]

## 2022-06-29 NOTE — Sedation Documentation (Signed)
Notable oozing from Right IJ catheter. Devin RN floor nurse notified.

## 2022-06-29 NOTE — Progress Notes (Signed)
[facility] Night coverage note: BP 82 systolic, had dialysis earlier in evening. Ordering 500cc total bolus.

## 2022-06-29 NOTE — Progress Notes (Signed)
PROGRESS NOTE        PATIENT DETAILS Name: [name] Age: 51 y.o. Sex: male Date of Birth: [birth year] Admit Date: 06/27/2022 Admitting Physician Lequita Halt, MD [id number], No Pcp Per  Brief Summary: Patient is a 51 y.o.  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  Declotting procedure-found to be hyperkalemic and subsequently admitted to the hospitalist service.  Significant events: 9/25>> admit to [facility].  Significant studies: 9/25>> CXR: No PNA  Significant microbiology data: None  Procedures: 9/25>> placement of nontunneled HD catheter.  Consults: IR/nephrology  Subjective: Lying comfortably in bed-denies any chest pain or shortness of breath.  Objective: Vitals: Blood pressure 117/84, pulse 64, temperature 98 F (36.7 C), temperature source Oral, resp. rate 16, height 6' (1.829 m), weight 66.7 kg, SpO2 100 %.   Exam: Gen Exam:Alert awake-not in any distress HEENT:atraumatic, normocephalic Chest: B/L clear to auscultation anteriorly CVS:S1S2 regular Abdomen:soft non tender, non distended Extremities:no edema Neurology: Non focal Skin: no rash  Pertinent Labs/Radiology:    Latest Ref Rng & Units 06/29/2022    1:35 AM 06/28/2022    3:32 AM 06/27/2022   10:29 AM  CBC  WBC 4.0 - 10.5 K/uL 6.2  6.1  4.2   Hemoglobin 13.0 - 17.0 g/dL 13.6  14.0  13.8   Hematocrit 39.0 - 52.0 % 40.6  39.7  41.4   Platelets 150 - 400 K/uL 222  256  229     Lab Results  Component Value Date   NA 135 06/29/2022   K 4.9 06/29/2022   CL 94 (L) 06/29/2022   CO2 23 06/29/2022      Assessment/Plan: Hyperkalemia: Treated urgently this morning-with IV insulin/dextrose/Lokelma/sodium bicarb-repeat K much improved.  Nephrology following-we will defer HD care to nephrology.  ESRD on HD MWF: Nephrology following.  Clotted AVG: Declot procedure  scheduled today.  IR following.  Chronic HFrEF (EF 45-50% by TTE on 8/19): Volume removal with HD.  HTN: Stable-continue amlodipine, clonidine, resume metoprolol when able.  BMI: Estimated body mass index is 19.94 kg/m as calculated from the following:   Height as of this encounter: 6' (1.829 m).   Weight as of this encounter: 66.7 kg.   Code status:   Code Status: Full Code   DVT Prophylaxis: heparin injection 5,000 Units Start: 06/27/22 1400   Family Communication: None at bedside   Disposition Plan: Status is: Inpatient Remains inpatient appropriate because: Hyperkalemia-clotted AVG-for declot procedure-not yet stable for discharge.   Planned Discharge Destination:Home   Diet: Diet Order             Diet NPO time specified Except for: Sips with Meds  Diet effective midnight                     Antimicrobial agents: Anti-infectives (From admission, onward)    None        MEDICATIONS: Scheduled Meds:  amLODipine  10 mg Oral Q1400   calcium carbonate  4,000 mg Oral TID WC   Chlorhexidine Gluconate Cloth  6 each Topical Q0600   cloNIDine  0.1 mg Oral QHS   fentaNYL       heparin  5,000 Units Subcutaneous Q8H   heparin sodium (porcine)  midazolam       sodium bicarbonate  1,300 mg Oral BID   Continuous Infusions:  anticoagulant sodium citrate     PRN Meds:.acetaminophen, alteplase, anticoagulant sodium citrate, fentaNYL, heparin, heparin sodium (porcine), lidocaine (PF), lidocaine-prilocaine, midazolam, pentafluoroprop-tetrafluoroeth   I have personally reviewed following labs and imaging studies  LABORATORY DATA: CBC: Recent Labs  Lab 06/24/22 1327 06/27/22 1029 06/28/22 0332 06/29/22 0135  WBC 9.4 4.2 6.1 6.2  NEUTROABS 7.1 4.0  --   --   HGB 13.3 13.8 14.0 13.6  HCT 39.7 41.4 39.7 40.6  MCV 92.8 93.9 89.6 93.1  PLT 181 229 256 166    Basic Metabolic Panel: Recent Labs  Lab 06/24/22 1327 06/27/22 1029 06/27/22 2023  06/28/22 0332 06/29/22 0135 06/29/22 0839  NA 137 138 138 135 135  --   K 5.2* 6.4* 6.1* 4.3 6.5* 4.9  CL 96* 93* 90* 89* 94*  --   CO2 24 15* 18* 27 23  --   GLUCOSE 92 117* 110* 203* 132*  --   BUN 66* 113* 124* 47* 70*  --   CREATININE 15.20* 23.45* 24.66* 13.53* 15.13*  --   CALCIUM 8.2* 8.4* 8.2* 8.8* 7.7*  --   PHOS  --   --  6.8*  --   --   --     GFR: Estimated Creatinine Clearance: 5.6 mL/min (A) (by C-G formula based on SCr of 15.13 mg/dL (H)).  Liver Function Tests: Recent Labs  Lab 06/27/22 1029 06/27/22 2023  AST 13*  --   ALT 14  --   ALKPHOS 65  --   BILITOT 0.8  --   PROT 9.1*  --   ALBUMIN 4.2 3.7   No results for input(s): "LIPASE", "AMYLASE" in the last 168 hours. No results for input(s): "AMMONIA" in the last 168 hours.  Coagulation Profile: Recent Labs  Lab 06/29/22 0135  INR 0.9    Cardiac Enzymes: No results for input(s): "CKTOTAL", "CKMB", "CKMBINDEX", "TROPONINI" in the last 168 hours.  BNP (last 3 results) No results for input(s): "PROBNP" in the last 8760 hours.  Lipid Profile: No results for input(s): "CHOL", "HDL", "LDLCALC", "TRIG", "CHOLHDL", "LDLDIRECT" in the last 72 hours.  Thyroid Function Tests: No results for input(s): "TSH", "T4TOTAL", "FREET4", "T3FREE", "THYROIDAB" in the last 72 hours.  Anemia Panel: No results for input(s): "VITAMINB12", "FOLATE", "FERRITIN", "TIBC", "IRON", "RETICCTPCT" in the last 72 hours.  Urine analysis:    Component Value Date/Time   COLORURINE YELLOW 08/01/2009 0742   APPEARANCEUR TURBID (A) 08/01/2009 0742   LABSPEC 1.018 08/01/2009 0742   PHURINE 5.5 08/01/2009 0742   GLUCOSEU NEGATIVE 08/01/2009 0742   HGBUR LARGE (A) 08/01/2009 0742   BILIRUBINUR SMALL (A) 08/01/2009 0742   KETONESUR 15 (A) 08/01/2009 0742   PROTEINUR 100 (A) 08/01/2009 0742   UROBILINOGEN 1.0 08/01/2009 0742   NITRITE NEGATIVE 08/01/2009 0742   LEUKOCYTESUR LARGE (A) 08/01/2009 0742    Sepsis Labs: Lactic  Acid, Venous    Component Value Date/Time   LATICACIDVEN 1.1 05/27/2021 0348    MICROBIOLOGY: No results found for this or any previous visit (from the past 240 hour(s)).  RADIOLOGY STUDIES/RESULTS: IR Fluoro Guide CV Line Right  Result Date: 06/28/2022 INDICATION: 51 year old with end-stage renal disease and occluded AV graft. Patient needs temporary dialysis catheter. EXAM: FLUOROSCOPIC AND ULTRASOUND GUIDED PLACEMENT OF A NON-TUNNELED DIALYSIS CATHETER Physician: Stephan Minister. Anselm Pancoast, MD MEDICATIONS: 1% lidocaine for local anesthetic ANESTHESIA/SEDATION: None FLUOROSCOPY TIME:  Radiation Exposure Index (  as provided by the fluoroscopic device): 1 mGy Kerma COMPLICATIONS: None immediate. PROCEDURE: The procedure was explained to the patient. The risks and benefits of the procedure were discussed and the patient's questions were addressed. Informed consent was obtained from the patient. The patient was placed supine on the interventional table. Ultrasound confirmed a patent right internal jugular vein. Ultrasound images were obtained for documentation. The right neck was prepped and draped in a sterile fashion. The right neck was anesthetized with 1% lidocaine. Maximal barrier sterile technique was utilized including caps, mask, sterile gowns, sterile gloves, sterile drape, hand hygiene and skin antiseptic. A small incision was made with #11 blade scalpel. A 21 gauge needle directed into the right internal jugular vein with ultrasound guidance. A micropuncture dilator set was placed. A 20 cm Mahurkar catheter was selected. The catheter was advanced over a wire and positioned at the superior cavoatrial junction. Fluoroscopic images were obtained for documentation. Both dialysis lumens were found to aspirate and flush well. The proper amount of heparin was flushed in both lumens. The central venous lumen was flushed with normal saline. Catheter was sutured to skin. Fluoroscopic and ultrasound images were taken and  saved for documentation. FINDINGS: Catheter tip at the superior cavoatrial junction. IMPRESSION: Successful placement of a right jugular non-tunneled dialysis catheter using ultrasound and fluoroscopic guidance. Electronically Signed   By: Markus Daft M.D.   On: 06/28/2022 08:19   IR US Guide Vasc Access Right  Result Date: 06/28/2022 INDICATION: 51 year old with end-stage renal disease and occluded AV graft. Patient needs temporary dialysis catheter. EXAM: FLUOROSCOPIC AND ULTRASOUND GUIDED PLACEMENT OF A NON-TUNNELED DIALYSIS CATHETER Physician: Stephan Minister. Henn, MD MEDICATIONS: 1% lidocaine for local anesthetic ANESTHESIA/SEDATION: None FLUOROSCOPY TIME:  Radiation Exposure Index (as provided by the fluoroscopic device): 1 mGy Kerma COMPLICATIONS: None immediate. PROCEDURE: The procedure was explained to the patient. The risks and benefits of the procedure were discussed and the patient's questions were addressed. Informed consent was obtained from the patient. The patient was placed supine on the interventional table. Ultrasound confirmed a patent right internal jugular vein. Ultrasound images were obtained for documentation. The right neck was prepped and draped in a sterile fashion. The right neck was anesthetized with 1% lidocaine. Maximal barrier sterile technique was utilized including caps, mask, sterile gowns, sterile gloves, sterile drape, hand hygiene and skin antiseptic. A small incision was made with #11 blade scalpel. A 21 gauge needle directed into the right internal jugular vein with ultrasound guidance. A micropuncture dilator set was placed. A 20 cm Mahurkar catheter was selected. The catheter was advanced over a wire and positioned at the superior cavoatrial junction. Fluoroscopic images were obtained for documentation. Both dialysis lumens were found to aspirate and flush well. The proper amount of heparin was flushed in both lumens. The central venous lumen was flushed with normal saline.  Catheter was sutured to skin. Fluoroscopic and ultrasound images were taken and saved for documentation. FINDINGS: Catheter tip at the superior cavoatrial junction. IMPRESSION: Successful placement of a right jugular non-tunneled dialysis catheter using ultrasound and fluoroscopic guidance. Electronically Signed   By: Markus Daft M.D.   On: 06/28/2022 08:19     LOS: 2 days   Oren Binet, MD  [facility]    To contact the attending provider between 7A-7P or the covering provider during after hours 7P-7A, please log into the web site [email or web address] and access using universal [place] password for that web site. If you do not have the password, please  call the hospital operator.  06/29/2022, 11:14 AM

## 2022-06-29 NOTE — Progress Notes (Signed)
[facility] Progress Note   Subjective: seen in room. Has just returned from IR S/P declot R thigh AVG. Drsg intact +T/B. Bleeding from exit site of temp cath. Unable to do HD with temp cath 09/26. Will remove post HD today.   Objective Vitals:   06/29/22 0039 06/29/22 0313 06/29/22 0809 06/29/22 0900  BP: 113/81 117/84    Pulse: 61 64    Resp: 18 18 18 16   Temp: 98.2 F (36.8 C) 98 F (36.7 C)    TempSrc: Oral Oral    SpO2: 97% 100%    Weight:      Height:       Physical Exam General: Pleasant, NAD Heart: RRR Lungs: No WOB. CTAB Abdomen:NABs, NT, ND Extremities: No LE edema Dialysis Access: Right thigh AVG +T/B RIJ Temp oozing from exit site. Guaze drsg in place.     Additional Objective Labs: Basic Metabolic Panel: Recent Labs  Lab 06/27/22 2023 06/28/22 0332 06/29/22 0135 06/29/22 0839  NA 138 135 135  --   K 6.1* 4.3 6.5* 4.9  CL 90* 89* 94*  --   CO2 18* 27 23  --   GLUCOSE 110* 203* 132*  --   BUN 124* 47* 70*  --   CREATININE 24.66* 13.53* 15.13*  --   CALCIUM 8.2* 8.8* 7.7*  --   PHOS 6.8*  --   --   --    Liver Function Tests: Recent Labs  Lab 06/27/22 1029 06/27/22 2023  AST 13*  --   ALT 14  --   ALKPHOS 65  --   BILITOT 0.8  --   PROT 9.1*  --   ALBUMIN 4.2 3.7   No results for input(s): "LIPASE", "AMYLASE" in the last 168 hours. CBC: Recent Labs  Lab 06/24/22 1327 06/27/22 1029 06/28/22 0332 06/29/22 0135  WBC 9.4 4.2 6.1 6.2  NEUTROABS 7.1 4.0  --   --   HGB 13.3 13.8 14.0 13.6  HCT 39.7 41.4 39.7 40.6  MCV 92.8 93.9 89.6 93.1  PLT 181 229 256 222   Blood Culture    Component Value Date/Time   SDES  05/27/2021 0348    BLOOD BLOOD RIGHT FOREARM Performed at [facility], [place] [address] [place], [place] [address]    SDES  05/27/2021 [phone]    BLOOD BLOOD RIGHT HAND Performed at [facility], [place] [address], [place], [place] [address]    SPECREQUEST  05/27/2021 0348     BOTTLES DRAWN AEROBIC AND ANAEROBIC Blood Culture adequate volume Performed at [place] [address] [place], [place] [address]    SPECREQUEST  05/27/2021 0348    BOTTLES DRAWN AEROBIC AND ANAEROBIC Blood Culture adequate volume Performed at [facility] [address] [place], [place] [address]    CULT  05/27/2021 0348    NO GROWTH 5 DAYS Performed at [facility], [place] [address], [place], [place] [address]    CULT  05/27/2021 0348    NO GROWTH 5 DAYS Performed at [facility], [place] [address] [place], [place] [address]    REPTSTATUS 06/01/2021 FINAL 05/27/2021 0348   REPTSTATUS 06/01/2021 FINAL 05/27/2021 0348    Cardiac Enzymes: No results for input(s): "CKTOTAL", "CKMB", "CKMBINDEX", "TROPONINI" in the last 168 hours. CBG: No results for input(s): "GLUCAP" in the last 168 hours. Iron Studies: No results for input(s): "IRON", "TIBC", "TRANSFERRIN", "FERRITIN" in the last 72 hours. @lablastinr3 @ Studies/Results:  IR Fluoro Guide CV Line Right  Result Date: 06/28/2022 INDICATION: 51 year old with end-stage renal disease and occluded AV graft. Patient needs temporary dialysis catheter. EXAM: FLUOROSCOPIC AND ULTRASOUND GUIDED PLACEMENT OF A NON-TUNNELED DIALYSIS CATHETER Physician: Stephan Minister. Henn, MD MEDICATIONS: 1% lidocaine for local anesthetic ANESTHESIA/SEDATION: None FLUOROSCOPY TIME:  Radiation Exposure Index (as provided by the fluoroscopic device): 1 mGy Kerma COMPLICATIONS: None immediate. PROCEDURE: The procedure was explained to the patient. The risks and benefits of the procedure were discussed and the patient's questions were addressed. Informed consent was obtained from the patient. The patient was placed supine on the interventional table. Ultrasound confirmed a patent right internal jugular vein. Ultrasound images were obtained for documentation. The right neck was prepped and draped in a sterile fashion. The  right neck was anesthetized with 1% lidocaine. Maximal barrier sterile technique was utilized including caps, mask, sterile gowns, sterile gloves, sterile drape, hand hygiene and skin antiseptic. A small incision was made with #11 blade scalpel. A 21 gauge needle directed into the right internal jugular vein with ultrasound guidance. A micropuncture dilator set was placed. A 20 cm Mahurkar catheter was selected. The catheter was advanced over a wire and positioned at the superior cavoatrial junction. Fluoroscopic images were obtained for documentation. Both dialysis lumens were found to aspirate and flush well. The proper amount of heparin was flushed in both lumens. The central venous lumen was flushed with normal saline. Catheter was sutured to skin. Fluoroscopic and ultrasound images were taken and saved for documentation. FINDINGS: Catheter tip at the superior cavoatrial junction. IMPRESSION: Successful placement of a right jugular non-tunneled dialysis catheter using ultrasound and fluoroscopic guidance. Electronically Signed   By: Markus Daft M.D.   On: 06/28/2022 08:19   IR US Guide Vasc Access Right  Result Date: 06/28/2022 INDICATION: 51 year old with end-stage renal disease and occluded AV graft. Patient needs temporary dialysis catheter. EXAM: FLUOROSCOPIC AND ULTRASOUND GUIDED PLACEMENT OF A NON-TUNNELED DIALYSIS CATHETER Physician: Stephan Minister. Henn, MD MEDICATIONS: 1% lidocaine for local anesthetic ANESTHESIA/SEDATION: None FLUOROSCOPY TIME:  Radiation Exposure Index (as provided by the fluoroscopic device): 1 mGy Kerma COMPLICATIONS: None immediate. PROCEDURE: The procedure was explained to the patient. The risks and benefits of the procedure were discussed and the patient's questions were addressed. Informed consent was obtained from the patient. The patient was placed supine on the interventional table. Ultrasound confirmed a patent right internal jugular vein. Ultrasound images were obtained for  documentation. The right neck was prepped and draped in a sterile fashion. The right neck was anesthetized with 1% lidocaine. Maximal barrier sterile technique was utilized including caps, mask, sterile gowns, sterile gloves, sterile drape, hand hygiene and skin antiseptic. A small incision was made with #11 blade scalpel. A 21 gauge needle directed into the right internal jugular vein with ultrasound guidance. A micropuncture dilator set was placed. A 20 cm Mahurkar catheter was selected. The catheter was advanced over a wire and positioned at the superior cavoatrial junction. Fluoroscopic images were obtained for documentation. Both dialysis lumens were found to aspirate and flush well. The proper amount of heparin was flushed in both lumens. The central venous lumen was flushed with normal saline. Catheter was sutured to skin. Fluoroscopic and ultrasound images were taken and saved for documentation. FINDINGS: Catheter tip at the superior cavoatrial junction. IMPRESSION: Successful placement of a right jugular non-tunneled dialysis catheter using ultrasound and fluoroscopic guidance. Electronically Signed   By: Markus Daft M.D.   On: 06/28/2022 08:19   Medications:  anticoagulant sodium citrate      amLODipine  10 mg Oral Q1400   calcium carbonate  4,000 mg Oral TID WC   Chlorhexidine Gluconate Cloth  6 each Topical Q0600   cloNIDine  0.1 mg Oral QHS   fentaNYL       heparin  5,000 Units Subcutaneous Q8H   heparin sodium (porcine)       midazolam       sodium bicarbonate  1,300 mg Oral BID     HD orders: [place] MWF 4 hrs 180NRe 450/autoflow 1.5 67.5 kg 2.0 K/ 2.5 Ca UFP 4 AVG -Heparin 1500 units IV TIW   Assessment/Plan  Clotted AVG: To IR this AM for declot. +T/B present thigh AVG. Remove Temp cath post HD.  Hyperkalemia: K+ 4.9 this AM ESRD-MWF-Attempted extra treatment 09/26 however had to abort D/T non functioning temp cath. HD today on schedule.  Hep B s Ag positive- Hep B  isolation HTN: BP controlled on home medications,  no LE edema. UF as tolerated.  Anemia: no ESA needed BMM: no VDRA, binders when eating Dispo: Discharge post HD today.   Ladell Lea H. Reedy Biernat NP-C 06/29/2022, 11:48 AM  [employer] [phone]

## 2022-06-30 ENCOUNTER — Encounter (HOSPITAL_COMMUNITY): Payer: Self-pay

## 2022-06-30 LAB — RENAL FUNCTION PANEL
Albumin: 3.3 g/dL — ABNORMAL LOW (ref 3.5–5.0)
Anion gap: 19 — ABNORMAL HIGH (ref 5–15)
BUN: 46 mg/dL — ABNORMAL HIGH (ref 6–20)
CO2: 29 mmol/L (ref 22–32)
Calcium: 8.2 mg/dL — ABNORMAL LOW (ref 8.9–10.3)
Chloride: 88 mmol/L — ABNORMAL LOW (ref 98–111)
Creatinine, Ser: 11.02 mg/dL — ABNORMAL HIGH (ref 0.61–1.24)
GFR, Estimated: 5 mL/min — ABNORMAL LOW (ref >60)
Glucose, Bld: 134 mg/dL — ABNORMAL HIGH (ref 70–99)
Phosphorus: 6.9 mg/dL — ABNORMAL HIGH (ref 2.5–4.6)
Potassium: 4.2 mmol/L (ref 3.5–5.1)
Sodium: 136 mmol/L (ref 135–145)

## 2022-06-30 MED ORDER — CLONIDINE HCL 0.1 MG PO TABS: 0.1000 mg | ORAL_TABLET | Freq: Every day | ORAL | 11 refills | Status: DC

## 2022-06-30 MED ORDER — AMLODIPINE BESYLATE 10 MG PO TABS: 10.0000 mg | ORAL_TABLET | Freq: Every day | ORAL | Status: DC

## 2022-06-30 MED ORDER — CLONIDINE HCL 0.1 MG PO TABS: 0.1000 mg | ORAL_TABLET | Freq: Every day | ORAL | Status: DC

## 2022-06-30 MED ORDER — LACTATED RINGERS IV BOLUS
250.0000 mL | Freq: Once | INTRAVENOUS | Status: AC
  Administered 2022-06-30: 250 mL via INTRAVENOUS

## 2022-06-30 NOTE — Progress Notes (Signed)
Discharge instructions (including medications) discussed with and copy provided to patient/caregiver 

## 2022-06-30 NOTE — Progress Notes (Addendum)
[facility] Progress Note   Subjective: DC orders noted. Still has temp cath (See IV team note 06/29/2022). Order placed again to remove TDC.   Objective Vitals:   06/30/22 0300 06/30/22 0514 06/30/22 0625 06/30/22 0827  BP: 96/71 99/81 108/76 109/78  Pulse: 75   78  Resp: 15   18  Temp: 97.9 F (36.6 C)   98 F (36.7 C)  TempSrc: Oral   Oral  SpO2:      Weight:      Height:       Physical Exam General: Pleasant, NAD Heart: RRR Lungs: No WOB. CTAB Abdomen:NABs, NT, ND Extremities: No LE edema Dialysis Access: Right thigh AVG +T/B Still has suture in place. Called HD to remove.  RIJ Temp drsg CDI.     Additional Objective Labs: Basic Metabolic Panel: Recent Labs  Lab 06/27/22 2023 06/28/22 0332 06/29/22 0135 06/29/22 0839 06/30/22 0511  NA 138 135 135  --  136  K 6.1* 4.3 6.5* 4.9 4.2  CL 90* 89* 94*  --  88*  CO2 18* 27 23  --  29  GLUCOSE 110* 203* 132*  --  134*  BUN 124* 47* 70*  --  46*  CREATININE 24.66* 13.53* 15.13*  --  11.02*  CALCIUM 8.2* 8.8* 7.7*  --  8.2*  PHOS 6.8*  --   --   --  6.9*   Liver Function Tests: Recent Labs  Lab 06/27/22 1029 06/27/22 2023 06/30/22 0511  AST 13*  --   --   ALT 14  --   --   ALKPHOS 65  --   --   BILITOT 0.8  --   --   PROT 9.1*  --   --   ALBUMIN 4.2 3.7 3.3*   No results for input(s): "LIPASE", "AMYLASE" in the last 168 hours. CBC: Recent Labs  Lab 06/24/22 1327 06/27/22 1029 06/28/22 0332 06/29/22 0135 06/29/22 2236  WBC 9.4 4.2 6.1 6.2  --   NEUTROABS 7.1 4.0  --   --   --   HGB 13.3 13.8 14.0 13.6 14.7  HCT 39.7 41.4 39.7 40.6 41.8  MCV 92.8 93.9 89.6 93.1  --   PLT 181 229 256 222  --    Blood Culture    Component Value Date/Time   SDES  05/27/2021 0348    BLOOD BLOOD RIGHT FOREARM Performed at [facility], [place] [address], [place], [place] [address]    SDES  05/27/2021 [phone]    BLOOD BLOOD RIGHT HAND Performed at [facility],  [place] [address] [place], [place] [address]    SPECREQUEST  05/27/2021 0348    BOTTLES DRAWN AEROBIC AND ANAEROBIC Blood Culture adequate volume Performed at [place] [address] [place], [place] [address]    SPECREQUEST  05/27/2021 0348    BOTTLES DRAWN AEROBIC AND ANAEROBIC Blood Culture adequate volume Performed at [place] [address], [place], [place] [address]    CULT  05/27/2021 0348    NO GROWTH 5 DAYS Performed at [facility], [place] [address], [place], [place] [address]    CULT  05/27/2021 0348    NO GROWTH 5 DAYS Performed at [facility], [place] [address] [place], [place] [address]    REPTSTATUS 06/01/2021 FINAL 05/27/2021 0348   REPTSTATUS 06/01/2021 FINAL 05/27/2021 0348    Cardiac Enzymes: No results for input(s): "CKTOTAL", "CKMB", "CKMBINDEX", "TROPONINI" in the  last 168 hours. CBG: No results for input(s): "GLUCAP" in the last 168 hours. Iron Studies: No results for input(s): "IRON", "TIBC", "TRANSFERRIN", "FERRITIN" in the last 72 hours. @[email or web address] @ Studies/Results: IR US Guide Vasc Access Right  Result Date: 06/29/2022 INDICATION: Clotted right thigh AV graft EXAM: Ultrasound-guided access of AV graft Second ultrasound-guided access of AV graft Pharmacomechanical thrombectomy of clotted AV graft Balloon angioplasty of stenotic segment of AV graft venous outflow MEDICATIONS: Per EMR; 8 mg tPA, 3000 units heparin IV Contrast material: 85 mL Omnipaque 300 ANESTHESIA/SEDATION: Moderate (conscious) sedation was employed during this procedure. A total of Versed 2.5 mg and Fentanyl 125 mcg was administered intravenously by the radiology nurse. Total intra-service moderate Sedation Time: 78 minutes. The patient's level of consciousness and vital signs were monitored continuously by radiology nursing throughout the procedure under my direct supervision. FLUOROSCOPY: Radiation Exposure Index (as provided by  the fluoroscopic device): 12 minutes (25 mGy) COMPLICATIONS: None immediate. PROCEDURE: Informed written consent was obtained from the patient after a thorough discussion of the procedural risks, benefits and alternatives. All questions were addressed. Maximal Sterile Barrier Technique was utilized including caps, mask, sterile gowns, sterile gloves, sterile drape, hand hygiene and skin antiseptic. A timeout was performed prior to the initiation of the procedure. The patient was placed supine on the exam table. The right thigh was prepped and draped in the standard sterile fashion with inclusion of the AV graft within the sterile field. Ultrasound was used to evaluate the AV graft, which found to be clotted. An ultrasound image was permanently stored in the electronic medical record. Using ultrasound guidance, the AV graft was directly punctured using a 21 gauge micropuncture set in an antegrade fashion near the arterial anastomosis. An 018 wire was advanced centrally, followed by serial tract dilation and placement of a short 7 French sheath. Using a combination of angled catheter and 035 wire, wire and catheter were advanced into the central venous outflow past the iliac vein stent. Pull-back venogram of the venous outflow was then performed to determine the extent of clot. The iliac vein was found to be patent. The clot extended to the venous anastomosis. The clot within the graft was then laced with 8 mg tPA. While this was allowed to dwell, a second retrograde access was obtained. Ultrasound was used to evaluate the AV graft, which was again found to be clotted. An ultrasound image was permanently stored in the electronic medical record. Using ultrasound guidance, the AV graft was directly punctured using a 21 gauge micropuncture set in an retrograde fashion near the apex of the loop. An 018 wire was easily advanced, followed by serial tract dilation and placement of a short 6 French sheath. After an  appropriate dwell time of the tPA, mechanical thrombectomy of the clotted AV graft was then performed using a 7 mm balloon. Pulling of the arterial plug was then performed using the retrograde access with multiple sweeps with the Fogarty balloon. Angiography of the AV graft at this time demonstrated patent flow through the AV graft. There remains some residual clot near the apex of the loop graft which was successfully treated with repeat angioplasty. The venous outflow in the iliac vein stent was then restudy. There was an area of moderate 50-70% stenosis of the peripheral aspect of the iliac vein stent. This was successfully treated with balloon angioplasty up to 12 mm. There was appropriate luminal gain after angioplasty, with fast flow through the AV graft. At the end of the procedure, all  wires and catheters were removed. Hemostasis was achieved at the access site using a combination of 0 silk suture and manual pressure. Clean dressings were placed. The patient tolerated the procedure well without immediate complication. IMPRESSION: 1. Successful pharmacomechanical thrombectomy of AV graft in the right thigh. 2. Successful balloon angioplasty of stenotic segment of the iliac vein stent up to 12 mm. ACCESS: This access remains amenable to future percutaneous interventions as clinically indicated. Electronically Signed   By: Albin Felling M.D.   On: 06/29/2022 13:56   IR THROMBECTOMY AV FISTULA/W THROMBOLYSIS/PTA INC SHUNT/IMG RIGHT  Result Date: 06/29/2022 INDICATION: Clotted right thigh AV graft EXAM: Ultrasound-guided access of AV graft Second ultrasound-guided access of AV graft Pharmacomechanical thrombectomy of clotted AV graft Balloon angioplasty of stenotic segment of AV graft venous outflow MEDICATIONS: Per EMR; 8 mg tPA, 3000 units heparin IV Contrast material: 85 mL Omnipaque 300 ANESTHESIA/SEDATION: Moderate (conscious) sedation was employed during this procedure. A total of Versed 2.5 mg and  Fentanyl 125 mcg was administered intravenously by the radiology nurse. Total intra-service moderate Sedation Time: 78 minutes. The patient's level of consciousness and vital signs were monitored continuously by radiology nursing throughout the procedure under my direct supervision. FLUOROSCOPY: Radiation Exposure Index (as provided by the fluoroscopic device): 12 minutes (25 mGy) COMPLICATIONS: None immediate. PROCEDURE: Informed written consent was obtained from the patient after a thorough discussion of the procedural risks, benefits and alternatives. All questions were addressed. Maximal Sterile Barrier Technique was utilized including caps, mask, sterile gowns, sterile gloves, sterile drape, hand hygiene and skin antiseptic. A timeout was performed prior to the initiation of the procedure. The patient was placed supine on the exam table. The right thigh was prepped and draped in the standard sterile fashion with inclusion of the AV graft within the sterile field. Ultrasound was used to evaluate the AV graft, which found to be clotted. An ultrasound image was permanently stored in the electronic medical record. Using ultrasound guidance, the AV graft was directly punctured using a 21 gauge micropuncture set in an antegrade fashion near the arterial anastomosis. An 018 wire was advanced centrally, followed by serial tract dilation and placement of a short 7 French sheath. Using a combination of angled catheter and 035 wire, wire and catheter were advanced into the central venous outflow past the iliac vein stent. Pull-back venogram of the venous outflow was then performed to determine the extent of clot. The iliac vein was found to be patent. The clot extended to the venous anastomosis. The clot within the graft was then laced with 8 mg tPA. While this was allowed to dwell, a second retrograde access was obtained. Ultrasound was used to evaluate the AV graft, which was again found to be clotted. An ultrasound  image was permanently stored in the electronic medical record. Using ultrasound guidance, the AV graft was directly punctured using a 21 gauge micropuncture set in an retrograde fashion near the apex of the loop. An 018 wire was easily advanced, followed by serial tract dilation and placement of a short 6 French sheath. After an appropriate dwell time of the tPA, mechanical thrombectomy of the clotted AV graft was then performed using a 7 mm balloon. Pulling of the arterial plug was then performed using the retrograde access with multiple sweeps with the Fogarty balloon. Angiography of the AV graft at this time demonstrated patent flow through the AV graft. There remains some residual clot near the apex of the loop graft which was successfully treated with  repeat angioplasty. The venous outflow in the iliac vein stent was then restudy. There was an area of moderate 50-70% stenosis of the peripheral aspect of the iliac vein stent. This was successfully treated with balloon angioplasty up to 12 mm. There was appropriate luminal gain after angioplasty, with fast flow through the AV graft. At the end of the procedure, all wires and catheters were removed. Hemostasis was achieved at the access site using a combination of 0 silk suture and manual pressure. Clean dressings were placed. The patient tolerated the procedure well without immediate complication. IMPRESSION: 1. Successful pharmacomechanical thrombectomy of AV graft in the right thigh. 2. Successful balloon angioplasty of stenotic segment of the iliac vein stent up to 12 mm. ACCESS: This access remains amenable to future percutaneous interventions as clinically indicated. Electronically Signed   By: Albin Felling M.D.   On: 06/29/2022 13:56   Medications:   [START ON 07/01/2022] amLODipine  10 mg Oral Q1400   calcium carbonate  4,000 mg Oral TID WC   Chlorhexidine Gluconate Cloth  6 each Topical Q0600   [START ON 07/01/2022] cloNIDine  0.1 mg Oral QHS    heparin  5,000 Units Subcutaneous Q8H   sodium bicarbonate  1,300 mg Oral BID     HD orders: [place] MWF 4 hrs 180NRe 450/autoflow 1.5 67.5 kg 2.0 K/ 2.5 Ca UFP 4 AVG -Heparin 1500 units IV TIW   Assessment/Plan  Clotted AVG: To IR this AM for declot. +T/B present thigh AVG. Remove Temp cath post HD.  Hyperkalemia: K+ 4.9 this AM ESRD-MWF-Attempted extra treatment 09/26 however had to abort D/T non functioning temp cath. HD t9/27/2023 via AVG. Next HD 07/01/2022 at OP clinic.  Hep B s Ag positive- Hep B isolation HTN: BP controlled on home medications,  no LE edema. UF as tolerated.  Anemia: no ESA needed BMM: no VDRA, binders when eating Dispo: Discharge post HD today.   [name] H. Umair Rosiles NP-C 06/30/2022, 10:57 AM  [employer] [phone]

## 2022-06-30 NOTE — TOC Transition Note (Signed)
Transition of Care [facility]) - CM/SW Discharge Note   Patient Details  Name: [name] MRN: [id number] Date of Birth: 13-Feb-1971  [facility] CM/SW Contact:  Cyndi Bender, RN Phone Number: 06/30/2022, 12:30 PM   Clinical Narrative:     Patient stable for discharge. Spoke to patient regarding transition needs. Patient agreeable to have TOC make him a PCP apt. Sent request to CMA to make apt. Patient takes himself to apts and dialysis. Patient states he can afford his medications.  Patient gets his medications at [facility].  NO other TOC needs at this time.  Final next level of care: Home/Self Care Barriers to Discharge: Barriers Resolved   Patient Goals and CMS Choice Patient states their goals for this hospitalization and ongoing recovery are:: return home      Discharge Placement               home        Discharge Plan and Services                 home                    Social Determinants of Health (SDOH) Interventions     Readmission Risk Interventions    06/30/2022   12:29 PM  Readmission Risk Prevention Plan  Transportation Screening Complete  PCP or Specialist Appt within 5-7 Days Not Complete  Not Complete comments new PCP apt made  Home Care Screening Complete  Medication Review (RN CM) Complete

## 2022-06-30 NOTE — Plan of Care (Signed)

## 2022-06-30 NOTE — Discharge Summary (Signed)
PATIENT DETAILS Name: [name] Age: 51 y.o. Sex: male Date of Birth: 1971/06/06 MRN: [id number]. Admitting Physician: Lequita Halt, MD [id number], No Pcp Per  Admit Date: 06/27/2022 Discharge date: 06/30/2022  Recommendations for Outpatient Follow-up:  Follow up with PCP in 1-2 weeks Please obtain CMP/CBC in one week  Admitted From:  Home  Disposition: Home   Discharge Condition: fair  CODE STATUS:   Code Status: Full Code   Diet recommendation:  Diet Order             Diet - low sodium heart healthy           Diet renal with fluid restriction Fluid restriction: 1200 mL Fluid; Room service appropriate? Yes; Fluid consistency: Thin  Diet effective now                    Brief Summary: Patient is a 51 y.o.  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    Significant events: 9/25>> admit to [facility]   Significant studies: 9/25>> CXR: No PNA   Significant microbiology data: None   Procedures: 9/25>> placement of nontunneled HD catheter.   Consults: IR/nephrology  Brief Hospital Course: Hyperkalemia:  Treated with IV insulin/dextrose/Lokelma/sodium bicarb-and then subsequently with hemodialysis.  Potassium has now normalized.    ESRD on HD MWF Follow closely-underwent HD on 9/27.  Resume usual hemodialysis schedule.   Clotted AVG:  Declot procedure successfully completed by IR on 9/27.   Chronic HFrEF (EF 45-50% by TTE on 8/19) Volume removal with HD.   HTN:  Developed hypotension post HD yesterday-we will hold all antihypertensives for 1 day-and resume subsequently.  BP now stable.   BMI: Estimated body mass index is 19.94 kg/m as calculated from the following:   Height  as of this encounter: 6' (1.829 m).   Weight as of this encounter: 66.7 kg.    Discharge Diagnoses:  Principal Problem:   Hyperkalemia Active Problems:   ESRD (end stage renal disease) on dialysis [facility])   Problem with vascular access   Essential hypertension   Thyroid disease   Discharge Instructions:  Activity:  As tolerated   Discharge Instructions     Diet - low sodium heart healthy   Complete by: As directed    Discharge instructions   Complete by: As directed    Follow with Primary MD in 1-2 weeks  Please get a complete blood count and chemistry panel checked by your Primary MD at your next visit, and again as instructed by your Primary MD.  Get Medicines reviewed and adjusted: Please take all your medications with you for your next visit with your Primary MD  Laboratory/radiological data: Please request your Primary MD to go over all hospital tests and procedure/radiological results at the follow up, please ask your Primary MD to get all Hospital records sent to his/her office.  In some cases, they will be blood work, cultures and biopsy results pending at the time of your discharge. Please request that your primary care M.D. follows up on these results.  Also Note the following: If you experience worsening of your admission symptoms, develop shortness of breath, life threatening emergency, suicidal or homicidal thoughts you must seek medical attention  immediately by calling 911 or calling your MD immediately  if symptoms less severe.  You must read complete instructions/literature along with all the possible adverse reactions/side effects for all the Medicines you take and that have been prescribed to you. Take any new Medicines after you have completely understood and accpet all the possible adverse reactions/side effects.   Do not drive when taking Pain medications or sleeping medications (Benzodaizepines)  Do not take more than prescribed Pain, Sleep and Anxiety  Medications. It is not advisable to combine anxiety,sleep and pain medications without talking with your primary care practitioner  Special Instructions: If you have smoked or chewed Tobacco  in the last 2 yrs please stop smoking, stop any regular Alcohol  and or any Recreational drug use.  Wear Seat belts while driving.  Please note: You were cared for by a hospitalist during your hospital stay. Once you are discharged, your primary care physician will handle any further medical issues. Please note that NO REFILLS for any discharge medications will be authorized once you are discharged, as it is imperative that you return to your primary care physician (or establish a relationship with a primary care physician if you do not have one) for your post hospital discharge needs so that they can reassess your need for medications and monitor your lab values.   Increase activity slowly   Complete by: As directed    No dressing needed   Complete by: As directed       Allergies as of 06/30/2022       Reactions   Ivp Dye [iodinated Contrast Media] Swelling   SWELLING REACTION UNSPECIFIED    Lisinopril Cough   Solu-medrol [methylprednisolone] Nausea And Vomiting        Medication List     STOP taking these medications    diphenhydrAMINE 50 MG tablet Commonly known as: BENADRYL   metoprolol succinate 25 MG 24 hr tablet Commonly known as: TOPROL-XL   predniSONE 50 MG tablet Commonly known as: DELTASONE       TAKE these medications    acetaminophen 500 MG tablet Commonly known as: TYLENOL Take 1,000 mg by mouth daily as needed (for pain or headaches).   amLODipine 10 MG tablet Commonly known as: NORVASC Take 1 tablet (10 mg total) by mouth daily at 2 PM. Start taking on: July 01, 2022   cloNIDine 0.1 MG tablet Commonly known as: Catapres Take 1 tablet (0.1 mg total) by mouth at bedtime. Start taking on: July 01, 2022   DIALYVITE TABLET Tabs Take 1 tablet by  mouth daily.   Tums Ultra 1000 400 MG chewable tablet Generic drug: calcium elemental as carbonate Chew 4,000 mg by mouth 3 (three) times daily with meals.               Discharge Care Instructions  (From admission, onward)           Start     Ordered   06/30/22 0000  No dressing needed        06/30/22 1033            Follow-up Information     Primary care MD. Schedule an appointment as soon as possible for a visit in 1 week(s).          Dialysis center Follow up.   Why: Follow at your usual schedule.               Allergies  Allergen Reactions   Ivp Dye [Iodinated Contrast  Media] Swelling    SWELLING REACTION UNSPECIFIED    Lisinopril Cough   Solu-Medrol [Methylprednisolone] Nausea And Vomiting     Other Procedures/Studies: IR US Guide Vasc Access Right  Result Date: 06/29/2022 INDICATION: Clotted right thigh AV graft EXAM: Ultrasound-guided access of AV graft Second ultrasound-guided access of AV graft Pharmacomechanical thrombectomy of clotted AV graft Balloon angioplasty of stenotic segment of AV graft venous outflow MEDICATIONS: Per EMR; 8 mg tPA, 3000 units heparin IV Contrast material: 85 mL Omnipaque 300 ANESTHESIA/SEDATION: Moderate (conscious) sedation was employed during this procedure. A total of Versed 2.5 mg and Fentanyl 125 mcg was administered intravenously by the radiology nurse. Total intra-service moderate Sedation Time: 78 minutes. The patient's level of consciousness and vital signs were monitored continuously by radiology nursing throughout the procedure under my direct supervision. FLUOROSCOPY: Radiation Exposure Index (as provided by the fluoroscopic device): 12 minutes (25 mGy) COMPLICATIONS: None immediate. PROCEDURE: Informed written consent was obtained from the patient after a thorough discussion of the procedural risks, benefits and alternatives. All questions were addressed. Maximal Sterile Barrier Technique was utilized  including caps, mask, sterile gowns, sterile gloves, sterile drape, hand hygiene and skin antiseptic. A timeout was performed prior to the initiation of the procedure. The patient was placed supine on the exam table. The right thigh was prepped and draped in the standard sterile fashion with inclusion of the AV graft within the sterile field. Ultrasound was used to evaluate the AV graft, which found to be clotted. An ultrasound image was permanently stored in the electronic medical record. Using ultrasound guidance, the AV graft was directly punctured using a 21 gauge micropuncture set in an antegrade fashion near the arterial anastomosis. An 018 wire was advanced centrally, followed by serial tract dilation and placement of a short 7 French sheath. Using a combination of angled catheter and 035 wire, wire and catheter were advanced into the central venous outflow past the iliac vein stent. Pull-back venogram of the venous outflow was then performed to determine the extent of clot. The iliac vein was found to be patent. The clot extended to the venous anastomosis. The clot within the graft was then laced with 8 mg tPA. While this was allowed to dwell, a second retrograde access was obtained. Ultrasound was used to evaluate the AV graft, which was again found to be clotted. An ultrasound image was permanently stored in the electronic medical record. Using ultrasound guidance, the AV graft was directly punctured using a 21 gauge micropuncture set in an retrograde fashion near the apex of the loop. An 018 wire was easily advanced, followed by serial tract dilation and placement of a short 6 French sheath. After an appropriate dwell time of the tPA, mechanical thrombectomy of the clotted AV graft was then performed using a 7 mm balloon. Pulling of the arterial plug was then performed using the retrograde access with multiple sweeps with the Fogarty balloon. Angiography of the AV graft at this time demonstrated patent  flow through the AV graft. There remains some residual clot near the apex of the loop graft which was successfully treated with repeat angioplasty. The venous outflow in the iliac vein stent was then restudy. There was an area of moderate 50-70% stenosis of the peripheral aspect of the iliac vein stent. This was successfully treated with balloon angioplasty up to 12 mm. There was appropriate luminal gain after angioplasty, with fast flow through the AV graft. At the end of the procedure, all wires and catheters were removed.  Hemostasis was achieved at the access site using a combination of 0 silk suture and manual pressure. Clean dressings were placed. The patient tolerated the procedure well without immediate complication. IMPRESSION: 1. Successful pharmacomechanical thrombectomy of AV graft in the right thigh. 2. Successful balloon angioplasty of stenotic segment of the iliac vein stent up to 12 mm. ACCESS: This access remains amenable to future percutaneous interventions as clinically indicated. Electronically Signed   By: Albin Felling M.D.   On: 06/29/2022 13:56   IR THROMBECTOMY AV FISTULA/W THROMBOLYSIS/PTA INC SHUNT/IMG RIGHT  Result Date: 06/29/2022 INDICATION: Clotted right thigh AV graft EXAM: Ultrasound-guided access of AV graft Second ultrasound-guided access of AV graft Pharmacomechanical thrombectomy of clotted AV graft Balloon angioplasty of stenotic segment of AV graft venous outflow MEDICATIONS: Per EMR; 8 mg tPA, 3000 units heparin IV Contrast material: 85 mL Omnipaque 300 ANESTHESIA/SEDATION: Moderate (conscious) sedation was employed during this procedure. A total of Versed 2.5 mg and Fentanyl 125 mcg was administered intravenously by the radiology nurse. Total intra-service moderate Sedation Time: 78 minutes. The patient's level of consciousness and vital signs were monitored continuously by radiology nursing throughout the procedure under my direct supervision. FLUOROSCOPY: Radiation  Exposure Index (as provided by the fluoroscopic device): 12 minutes (25 mGy) COMPLICATIONS: None immediate. PROCEDURE: Informed written consent was obtained from the patient after a thorough discussion of the procedural risks, benefits and alternatives. All questions were addressed. Maximal Sterile Barrier Technique was utilized including caps, mask, sterile gowns, sterile gloves, sterile drape, hand hygiene and skin antiseptic. A timeout was performed prior to the initiation of the procedure. The patient was placed supine on the exam table. The right thigh was prepped and draped in the standard sterile fashion with inclusion of the AV graft within the sterile field. Ultrasound was used to evaluate the AV graft, which found to be clotted. An ultrasound image was permanently stored in the electronic medical record. Using ultrasound guidance, the AV graft was directly punctured using a 21 gauge micropuncture set in an antegrade fashion near the arterial anastomosis. An 018 wire was advanced centrally, followed by serial tract dilation and placement of a short 7 French sheath. Using a combination of angled catheter and 035 wire, wire and catheter were advanced into the central venous outflow past the iliac vein stent. Pull-back venogram of the venous outflow was then performed to determine the extent of clot. The iliac vein was found to be patent. The clot extended to the venous anastomosis. The clot within the graft was then laced with 8 mg tPA. While this was allowed to dwell, a second retrograde access was obtained. Ultrasound was used to evaluate the AV graft, which was again found to be clotted. An ultrasound image was permanently stored in the electronic medical record. Using ultrasound guidance, the AV graft was directly punctured using a 21 gauge micropuncture set in an retrograde fashion near the apex of the loop. An 018 wire was easily advanced, followed by serial tract dilation and placement of a short 6  French sheath. After an appropriate dwell time of the tPA, mechanical thrombectomy of the clotted AV graft was then performed using a 7 mm balloon. Pulling of the arterial plug was then performed using the retrograde access with multiple sweeps with the Fogarty balloon. Angiography of the AV graft at this time demonstrated patent flow through the AV graft. There remains some residual clot near the apex of the loop graft which was successfully treated with repeat angioplasty. The venous outflow  in the iliac vein stent was then restudy. There was an area of moderate 50-70% stenosis of the peripheral aspect of the iliac vein stent. This was successfully treated with balloon angioplasty up to 12 mm. There was appropriate luminal gain after angioplasty, with fast flow through the AV graft. At the end of the procedure, all wires and catheters were removed. Hemostasis was achieved at the access site using a combination of 0 silk suture and manual pressure. Clean dressings were placed. The patient tolerated the procedure well without immediate complication. IMPRESSION: 1. Successful pharmacomechanical thrombectomy of AV graft in the right thigh. 2. Successful balloon angioplasty of stenotic segment of the iliac vein stent up to 12 mm. ACCESS: This access remains amenable to future percutaneous interventions as clinically indicated. Electronically Signed   By: Albin Felling M.D.   On: 06/29/2022 13:56   IR Fluoro Guide CV Line Right  Result Date: 06/28/2022 INDICATION: 51 year old with end-stage renal disease and occluded AV graft. Patient needs temporary dialysis catheter. EXAM: FLUOROSCOPIC AND ULTRASOUND GUIDED PLACEMENT OF A NON-TUNNELED DIALYSIS CATHETER Physician: Stephan Minister. Henn, MD MEDICATIONS: 1% lidocaine for local anesthetic ANESTHESIA/SEDATION: None FLUOROSCOPY TIME:  Radiation Exposure Index (as provided by the fluoroscopic device): 1 mGy Kerma COMPLICATIONS: None immediate. PROCEDURE: The procedure was  explained to the patient. The risks and benefits of the procedure were discussed and the patient's questions were addressed. Informed consent was obtained from the patient. The patient was placed supine on the interventional table. Ultrasound confirmed a patent right internal jugular vein. Ultrasound images were obtained for documentation. The right neck was prepped and draped in a sterile fashion. The right neck was anesthetized with 1% lidocaine. Maximal barrier sterile technique was utilized including caps, mask, sterile gowns, sterile gloves, sterile drape, hand hygiene and skin antiseptic. A small incision was made with #11 blade scalpel. A 21 gauge needle directed into the right internal jugular vein with ultrasound guidance. A micropuncture dilator set was placed. A 20 cm Mahurkar catheter was selected. The catheter was advanced over a wire and positioned at the superior cavoatrial junction. Fluoroscopic images were obtained for documentation. Both dialysis lumens were found to aspirate and flush well. The proper amount of heparin was flushed in both lumens. The central venous lumen was flushed with normal saline. Catheter was sutured to skin. Fluoroscopic and ultrasound images were taken and saved for documentation. FINDINGS: Catheter tip at the superior cavoatrial junction. IMPRESSION: Successful placement of a right jugular non-tunneled dialysis catheter using ultrasound and fluoroscopic guidance. Electronically Signed   By: Markus Daft M.D.   On: 06/28/2022 08:19   IR US Guide Vasc Access Right  Result Date: 06/28/2022 INDICATION: 51 year old with end-stage renal disease and occluded AV graft. Patient needs temporary dialysis catheter. EXAM: FLUOROSCOPIC AND ULTRASOUND GUIDED PLACEMENT OF A NON-TUNNELED DIALYSIS CATHETER Physician: Stephan Minister. Henn, MD MEDICATIONS: 1% lidocaine for local anesthetic ANESTHESIA/SEDATION: None FLUOROSCOPY TIME:  Radiation Exposure Index (as provided by the fluoroscopic  device): 1 mGy Kerma COMPLICATIONS: None immediate. PROCEDURE: The procedure was explained to the patient. The risks and benefits of the procedure were discussed and the patient's questions were addressed. Informed consent was obtained from the patient. The patient was placed supine on the interventional table. Ultrasound confirmed a patent right internal jugular vein. Ultrasound images were obtained for documentation. The right neck was prepped and draped in a sterile fashion. The right neck was anesthetized with 1% lidocaine. Maximal barrier sterile technique was utilized including caps, mask, sterile gowns, sterile gloves,  sterile drape, hand hygiene and skin antiseptic. A small incision was made with #11 blade scalpel. A 21 gauge needle directed into the right internal jugular vein with ultrasound guidance. A micropuncture dilator set was placed. A 20 cm Mahurkar catheter was selected. The catheter was advanced over a wire and positioned at the superior cavoatrial junction. Fluoroscopic images were obtained for documentation. Both dialysis lumens were found to aspirate and flush well. The proper amount of heparin was flushed in both lumens. The central venous lumen was flushed with normal saline. Catheter was sutured to skin. Fluoroscopic and ultrasound images were taken and saved for documentation. FINDINGS: Catheter tip at the superior cavoatrial junction. IMPRESSION: Successful placement of a right jugular non-tunneled dialysis catheter using ultrasound and fluoroscopic guidance. Electronically Signed   By: Markus Daft M.D.   On: 06/28/2022 08:19   DG Chest 2 View  Result Date: 06/27/2022 CLINICAL DATA:  Shortness of breath. EXAM: CHEST - 2 VIEW COMPARISON:  Chest x-ray 06/12/2022. FINDINGS: The heart size and mediastinal contours are within normal limits. Both lungs are clear. No visible pleural effusions or pneumothorax. No acute osseous abnormality. IMPRESSION: No evidence of acute cardiopulmonary  disease. Electronically Signed   By: Margaretha Sheffield M.D.   On: 06/27/2022 10:45   VAS [place] ABI WITH/WO TBI  Result Date: 06/23/2022  LOWER EXTREMITY DOPPLER STUDY Patient Name:  Kruz Chiu [facility]  Date of Exam:   06/23/2022 Medical Rec #: [id number]              Accession #:    [id number] Date of Birth: 02-09-1971              Patient Gender: M Patient Age:   73 years Exam Location:  [facility] Procedure:      VAS [place] ABI WITH/WO TBI Referring Phys: Lawson Radar --------------------------------------------------------------------------------  Indications: Pain and tingling of feet High Risk Factors: Hypertension. Other Factors: ESRD, neuropathy.  Comparison Study: No prior study Performing Technologist: Sharion Dove RVS  Examination Guidelines: A complete evaluation includes at minimum, Doppler waveform signals and systolic blood pressure reading at the level of bilateral brachial, anterior tibial, and posterior tibial arteries, when vessel segments are accessible. Bilateral testing is considered an integral part of a complete examination. Photoelectric Plethysmograph (PPG) waveforms and toe systolic pressure readings are included as required and additional duplex testing as needed. Limited examinations for reoccurring indications may be performed as noted.  ABI Findings: +---------+------------------+-----+-----------+--------+ Right    Rt Pressure (mmHg)IndexWaveform   Comment  +---------+------------------+-----+-----------+--------+ Brachial 137                    triphasic           +---------+------------------+-----+-----------+--------+ PTA      155               1.13 multiphasic         +---------+------------------+-----+-----------+--------+ DP       156               1.14 multiphasic         +---------+------------------+-----+-----------+--------+ Great Toe60                0.44                     +---------+------------------+-----+-----------+--------+  +---------+------------------+-----+-----------+---------+ Left     Lt Pressure (mmHg)IndexWaveform   Comment   +---------+------------------+-----+-----------+---------+ Brachial  Old graft +---------+------------------+-----+-----------+---------+ PTA      151               1.10 multiphasic          +---------+------------------+-----+-----------+---------+ DP       162               1.18 multiphasic          +---------+------------------+-----+-----------+---------+ Great Toe78                0.57                      +---------+------------------+-----+-----------+---------+ +-------+-----------+-----------+------------+------------+ ABI/TBIToday's ABIToday's TBIPrevious ABIPrevious TBI +-------+-----------+-----------+------------+------------+ Right  1.14       0.44                                +-------+-----------+-----------+------------+------------+ Left   1.18       0.57                                +-------+-----------+-----------+------------+------------+   Summary: Right: Resting right ankle-brachial index is within normal range. The right toe-brachial index is abnormal. Left: Resting left ankle-brachial index is within normal range. The left toe-brachial index is abnormal. *See table(s) above for measurements and observations.  Electronically signed by Monica Martinez MD on 06/23/2022 at 12:38:38 PM.    Final    VAS [place] LOWER EXTREMITY VENOUS (DVT)  Result Date: 06/23/2022  Lower Venous DVT Study Patient Name:  AZELL BILL  Date of Exam:   06/23/2022 Medical Rec #: [id number]              Accession #:    [id number] Date of Birth: 25-Aug-1971              Patient Gender: M Patient Age:   44 years Exam Location:  [facility] Procedure:      VAS [place] LOWER EXTREMITY VENOUS (DVT) Referring Phys: Lawson Radar --------------------------------------------------------------------------------  Indications:  Numbness and tingling of left foot.  Comparison Study: No prior left LEV Performing Technologist: Sharion Dove RVS  Examination Guidelines: A complete evaluation includes B-mode imaging, spectral Doppler, color Doppler, and power Doppler as needed of all accessible portions of each vessel. Bilateral testing is considered an integral part of a complete examination. Limited examinations for reoccurring indications may be performed as noted. The reflux portion of the exam is performed with the patient in reverse Trendelenburg.  +-----+---------------+---------+-----------+----------+--------------+ RIGHTCompressibilityPhasicitySpontaneityPropertiesThrombus Aging +-----+---------------+---------+-----------+----------+--------------+ CFV  Full           Yes      Yes                                 +-----+---------------+---------+-----------+----------+--------------+ Incidental finding: Right lower extremity dialysis access is thrombosed  +---------+---------------+---------+-----------+----------+--------------+ LEFT     CompressibilityPhasicitySpontaneityPropertiesThrombus Aging +---------+---------------+---------+-----------+----------+--------------+ CFV      Full           Yes      Yes                                 +---------+---------------+---------+-----------+----------+--------------+ SFJ      Full                                                        +---------+---------------+---------+-----------+----------+--------------+  FV Prox  Full                                                        +---------+---------------+---------+-----------+----------+--------------+ FV Mid   Full                                                        +---------+---------------+---------+-----------+----------+--------------+ FV DistalFull                                                         +---------+---------------+---------+-----------+----------+--------------+ PFV      Full                                                        +---------+---------------+---------+-----------+----------+--------------+ POP      Full           Yes      Yes                                 +---------+---------------+---------+-----------+----------+--------------+ PTV      Full                                                        +---------+---------------+---------+-----------+----------+--------------+ PERO     Full                                                        +---------+---------------+---------+-----------+----------+--------------+     Summary: RIGHT: - No evidence of common femoral vein obstruction. 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  LEFT: - There is no evidence of deep vein thrombosis in the lower extremity.  *See table(s) above for measurements and observations. Electronically signed by Monica Martinez MD on 06/23/2022 at 12:38:24 PM.    Final    CT Head Wo Contrast  Result Date: 06/12/2022 CLINICAL DATA:  BLUNT POLY TRAUMA, DIZZINESS AND CHEST PAIN, HEADACHE FOR 2 WEEKS. EXAM: CT HEAD WITHOUT CONTRAST TECHNIQUE: Contiguous axial images were obtained from the base of the skull through the vertex without intravenous contrast. RADIATION DOSE REDUCTION: This exam was performed according to the departmental dose-optimization program which includes automated exposure control, adjustment of the mA and/or kV according to patient size and/or use of iterative reconstruction technique. COMPARISON:  HEAD CT 03/27/2023 FINDINGS: Brain: There is mild global atrophy including centrally but it is greater than typically seen in a 50 year old. The gray-white matter differentiation and attenuation are normal. No infarct, hemorrhage  or mass are seen. Ventricles are normal in size and position. Basal cisterns are clear. Vascular: There are scattered calcifications of the carotid  siphons. There are no hyperdense central vessels. Skull: Negative for fracture or focal lesion. No scalp hematoma is seen. Sinuses/Orbits: No acute abnormality. Unremarkable orbital contents. 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 IMPRESSION: No acute intracranial CT findings, depressed skull fractures or interval changes. Electronically Signed   By: Telford Nab M.D.   On: 06/12/2022 07:27   DG Chest Portable 1 View  Result Date: 06/12/2022 CLINICAL DATA:  Short of breath with dizziness. EXAM: PORTABLE CHEST 1 VIEW COMPARISON:  03/28/2022 FINDINGS: Heart size and mediastinal contours are unremarkable. There is no pleural effusion or edema identified. No airspace opacities identified. IMPRESSION: No active disease. Electronically Signed   By: Kerby Moors M.D.   On: 06/12/2022 06:41     TODAY-DAY OF DISCHARGE:  Subjective:   [name] today has no headache,no chest abdominal pain,no new weakness tingling or numbness, feels much better wants to go home today.   Objective:   Blood pressure 109/78, pulse 78, temperature 98 F (36.7 C), temperature source Oral, resp. rate 18, height 6' (1.829 m), weight 64.2 kg, SpO2 95 %.  Intake/Output Summary (Last 24 hours) at 06/30/2022 1033 Last data filed at 06/30/2022 0814 Gross per 24 hour  Intake 851.08 ml  Output 2500 ml  Net -1648.92 ml   Filed Weights   06/27/22 1027 06/29/22 1524 06/29/22 1914  Weight: 66.7 kg 66.7 kg 64.2 kg    Exam: Awake Alert, Oriented *3, No new F.N deficits, Normal affect [place].AT,PERRAL Supple Neck,No JVD, No cervical lymphadenopathy appriciated.  Symmetrical Chest wall movement, Good air movement bilaterally, CTAB RRR,No Gallops,Rubs or new Murmurs, No Parasternal Heave +ve B.Sounds, Abd Soft, Non tender, No organomegaly  appriciated, No rebound -guarding or rigidity. No Cyanosis, Clubbing or edema, No new Rash or bruise   PERTINENT RADIOLOGIC STUDIES: IR US Guide Vasc Access Right  Result Date: 06/29/2022 INDICATION: Clotted right thigh AV graft EXAM: Ultrasound-guided access of AV graft Second ultrasound-guided access of AV graft Pharmacomechanical thrombectomy of clotted AV graft Balloon angioplasty of stenotic segment of AV graft venous outflow MEDICATIONS: Per EMR; 8 mg tPA, 3000 units heparin IV Contrast material: 85 mL Omnipaque 300 ANESTHESIA/SEDATION: Moderate (conscious) sedation was employed during this procedure. A total of Versed 2.5 mg and Fentanyl 125 mcg was administered intravenously by the radiology nurse. Total intra-service moderate Sedation Time: 78 minutes. The patient's level of consciousness and vital signs were monitored continuously by radiology nursing throughout the procedure under my direct supervision. FLUOROSCOPY: Radiation Exposure Index (as provided by the fluoroscopic device): 12 minutes (25 mGy) COMPLICATIONS: None immediate. PROCEDURE: Informed written consent was obtained from the patient after a thorough discussion of the procedural risks, benefits and alternatives. All questions were addressed. Maximal Sterile Barrier Technique was utilized including caps, mask, sterile gowns, sterile gloves, sterile drape, hand hygiene and skin antiseptic. A timeout was performed prior to the initiation of the procedure. The patient was placed supine on the exam table. The right thigh was prepped and draped in the standard sterile fashion with inclusion of the AV graft within the sterile field. Ultrasound was used to evaluate the AV graft, which found to be clotted. An ultrasound image was permanently stored in the electronic medical  record. Using ultrasound guidance, the AV graft was directly punctured using a 21 gauge micropuncture set in an antegrade fashion near the arterial anastomosis. An 018 wire  was advanced centrally, followed by serial tract dilation and placement of a short 7 French sheath. Using a combination of angled catheter and 035 wire, wire and catheter were advanced into the central venous outflow past the iliac vein stent. Pull-back venogram of the venous outflow was then performed to determine the extent of clot. The iliac vein was found to be patent. The clot extended to the venous anastomosis. The clot within the graft was then laced with 8 mg tPA. While this was allowed to dwell, a second retrograde access was obtained. Ultrasound was used to evaluate the AV graft, which was again found to be clotted. An ultrasound image was permanently stored in the electronic medical record. Using ultrasound guidance, the AV graft was directly punctured using a 21 gauge micropuncture set in an retrograde fashion near the apex of the loop. An 018 wire was easily advanced, followed by serial tract dilation and placement of a short 6 French sheath. After an appropriate dwell time of the tPA, mechanical thrombectomy of the clotted AV graft was then performed using a 7 mm balloon. Pulling of the arterial plug was then performed using the retrograde access with multiple sweeps with the Fogarty balloon. Angiography of the AV graft at this time demonstrated patent flow through the AV graft. There remains some residual clot near the apex of the loop graft which was successfully treated with repeat angioplasty. The venous outflow in the iliac vein stent was then restudy. There was an area of moderate 50-70% stenosis of the peripheral aspect of the iliac vein stent. This was successfully treated with balloon angioplasty up to 12 mm. There was appropriate luminal gain after angioplasty, with fast flow through the AV graft. At the end of the procedure, all wires and catheters were removed. Hemostasis was achieved at the access site using a combination of 0 silk suture and manual pressure. Clean dressings were placed.  The patient tolerated the procedure well without immediate complication. IMPRESSION: 1. Successful pharmacomechanical thrombectomy of AV graft in the right thigh. 2. Successful balloon angioplasty of stenotic segment of the iliac vein stent up to 12 mm. ACCESS: This access remains amenable to future percutaneous interventions as clinically indicated. Electronically Signed   By: Albin Felling M.D.   On: 06/29/2022 13:56   IR THROMBECTOMY AV FISTULA/W THROMBOLYSIS/PTA INC SHUNT/IMG RIGHT  Result Date: 06/29/2022 INDICATION: Clotted right thigh AV graft EXAM: Ultrasound-guided access of AV graft Second ultrasound-guided access of AV graft Pharmacomechanical thrombectomy of clotted AV graft Balloon angioplasty of stenotic segment of AV graft venous outflow MEDICATIONS: Per EMR; 8 mg tPA, 3000 units heparin IV Contrast material: 85 mL Omnipaque 300 ANESTHESIA/SEDATION: Moderate (conscious) sedation was employed during this procedure. A total of Versed 2.5 mg and Fentanyl 125 mcg was administered intravenously by the radiology nurse. Total intra-service moderate Sedation Time: 78 minutes. The patient's level of consciousness and vital signs were monitored continuously by radiology nursing throughout the procedure under my direct supervision. FLUOROSCOPY: Radiation Exposure Index (as provided by the fluoroscopic device): 12 minutes (25 mGy) COMPLICATIONS: None immediate. PROCEDURE: Informed written consent was obtained from the patient after a thorough discussion of the procedural risks, benefits and alternatives. All questions were addressed. Maximal Sterile Barrier Technique was utilized including caps, mask, sterile gowns, sterile gloves, sterile drape, hand hygiene and skin antiseptic. A timeout was  performed prior to the initiation of the procedure. The patient was placed supine on the exam table. The right thigh was prepped and draped in the standard sterile fashion with inclusion of the AV graft within the  sterile field. Ultrasound was used to evaluate the AV graft, which found to be clotted. An ultrasound image was permanently stored in the electronic medical record. Using ultrasound guidance, the AV graft was directly punctured using a 21 gauge micropuncture set in an antegrade fashion near the arterial anastomosis. An 018 wire was advanced centrally, followed by serial tract dilation and placement of a short 7 French sheath. Using a combination of angled catheter and 035 wire, wire and catheter were advanced into the central venous outflow past the iliac vein stent. Pull-back venogram of the venous outflow was then performed to determine the extent of clot. The iliac vein was found to be patent. The clot extended to the venous anastomosis. The clot within the graft was then laced with 8 mg tPA. While this was allowed to dwell, a second retrograde access was obtained. Ultrasound was used to evaluate the AV graft, which was again found to be clotted. An ultrasound image was permanently stored in the electronic medical record. Using ultrasound guidance, the AV graft was directly punctured using a 21 gauge micropuncture set in an retrograde fashion near the apex of the loop. An 018 wire was easily advanced, followed by serial tract dilation and placement of a short 6 French sheath. After an appropriate dwell time of the tPA, mechanical thrombectomy of the clotted AV graft was then performed using a 7 mm balloon. Pulling of the arterial plug was then performed using the retrograde access with multiple sweeps with the Fogarty balloon. Angiography of the AV graft at this time demonstrated patent flow through the AV graft. There remains some residual clot near the apex of the loop graft which was successfully treated with repeat angioplasty. The venous outflow in the iliac vein stent was then restudy. There was an area of moderate 50-70% stenosis of the peripheral aspect of the iliac vein stent. This was successfully  treated with balloon angioplasty up to 12 mm. There was appropriate luminal gain after angioplasty, with fast flow through the AV graft. At the end of the procedure, all wires and catheters were removed. Hemostasis was achieved at the access site using a combination of 0 silk suture and manual pressure. Clean dressings were placed. The patient tolerated the procedure well without immediate complication. IMPRESSION: 1. Successful pharmacomechanical thrombectomy of AV graft in the right thigh. 2. Successful balloon angioplasty of stenotic segment of the iliac vein stent up to 12 mm. ACCESS: This access remains amenable to future percutaneous interventions as clinically indicated. Electronically Signed   By: Albin Felling M.D.   On: 06/29/2022 13:56     PERTINENT LAB RESULTS: CBC: Recent Labs    06/28/22 0332 06/29/22 0135 06/29/22 2236  WBC 6.1 6.2  --   HGB 14.0 13.6 14.7  HCT 39.7 40.6 41.8  PLT 256 222  --    CMET CMP     Component Value Date/Time   NA 136 06/30/2022 0511   K 4.2 06/30/2022 0511   CL 88 (L) 06/30/2022 0511   CO2 29 06/30/2022 0511   GLUCOSE 134 (H) 06/30/2022 0511   BUN 46 (H) 06/30/2022 0511   CREATININE 11.02 (H) 06/30/2022 0511   CALCIUM 8.2 (L) 06/30/2022 0511   CALCIUM 8.7 02/16/2010 0827   PROT 9.1 (H) 06/27/2022 1029  ALBUMIN 3.3 (L) 06/30/2022 0511   AST 13 (L) 06/27/2022 1029   ALT 14 06/27/2022 1029   ALKPHOS 65 06/27/2022 1029   BILITOT 0.8 06/27/2022 1029   GFRNONAA 5 (L) 06/30/2022 0511   GFRAA 7 (L) 02/20/2020 0105    GFR Estimated Creatinine Clearance: 7.4 mL/min (A) (by C-G formula based on SCr of 11.02 mg/dL (H)). No results for input(s): "LIPASE", "AMYLASE" in the last 72 hours. No results for input(s): "CKTOTAL", "CKMB", "CKMBINDEX", "TROPONINI" in the last 72 hours. Invalid input(s): "POCBNP" No results for input(s): "DDIMER" in the last 72 hours. No results for input(s): "HGBA1C" in the last 72 hours. No results for input(s):  "CHOL", "HDL", "LDLCALC", "TRIG", "CHOLHDL", "LDLDIRECT" in the last 72 hours. No results for input(s): "TSH", "T4TOTAL", "T3FREE", "THYROIDAB" in the last 72 hours.  Invalid input(s): "FREET3" No results for input(s): "VITAMINB12", "FOLATE", "FERRITIN", "TIBC", "IRON", "RETICCTPCT" in the last 72 hours. Coags: Recent Labs    06/29/22 0135  INR 0.9   Microbiology: No results found for this or any previous visit (from the past 240 hour(s)).  FURTHER DISCHARGE INSTRUCTIONS:  Get Medicines reviewed and adjusted: Please take all your medications with you for your next visit with your Primary MD  Laboratory/radiological data: Please request your Primary MD to go over all hospital tests and procedure/radiological results at the follow up, please ask your Primary MD to get all Hospital records sent to his/her office.  In some cases, they will be blood work, cultures and biopsy results pending at the time of your discharge. Please request that your primary care M.D. goes through all the records of your hospital data and follows up on these results.  Also Note the following: If you experience worsening of your admission symptoms, develop shortness of breath, life threatening emergency, suicidal or homicidal thoughts you must seek medical attention immediately by calling 911 or calling your MD immediately  if symptoms less severe.  You must read complete instructions/literature along with all the possible adverse reactions/side effects for all the Medicines you take and that have been prescribed to you. Take any new Medicines after you have completely understood and accpet all the possible adverse reactions/side effects.   Do not drive when taking Pain medications or sleeping medications (Benzodaizepines)  Do not take more than prescribed Pain, Sleep and Anxiety Medications. It is not advisable to combine anxiety,sleep and pain medications without talking with your primary care  practitioner  Special Instructions: If you have smoked or chewed Tobacco  in the last 2 yrs please stop smoking, stop any regular Alcohol  and or any Recreational drug use.  Wear Seat belts while driving.  Please note: You were cared for by a hospitalist during your hospital stay. Once you are discharged, your primary care physician will handle any further medical issues. Please note that NO REFILLS for any discharge medications will be authorized once you are discharged, as it is imperative that you return to your primary care physician (or establish a relationship with a primary care physician if you do not have one) for your post hospital discharge needs so that they can reassess your need for medications and monitor your lab values.  Total Time spent coordinating discharge including counseling, education and face to face time equals greater than 30 minutes.  SignedOren Binet 06/30/2022 10:33 AM

## 2022-06-30 NOTE — Progress Notes (Signed)
R IJ temporary HD cath removed: manual pressure held 20+ minutes. Pt instructed to remain flat in bed :30. Leave dressing dry and intact for 24 hours.

## 2022-06-30 NOTE — Progress Notes (Signed)
D/C order noted. Contacted [facility] to advise clinic of pt's d/c today and that pt will resume care tomorrow.   Jaquisha Frech Renal Navigator [phone] 

## 2022-07-02 ENCOUNTER — Encounter (HOSPITAL_COMMUNITY): Payer: Self-pay | Admitting: *Deleted

## 2022-07-02 ENCOUNTER — Observation Stay (HOSPITAL_COMMUNITY)
Admission: EM | Admit: 2022-07-02 | Discharge: 2022-07-02 | Disposition: A | Discharge status: Home / Self Care | Payer: Self-pay | Attending: Internal Medicine | Admitting: Internal Medicine

## 2022-07-02 ENCOUNTER — Other Ambulatory Visit: Payer: Self-pay

## 2022-07-02 ENCOUNTER — Emergency Department (HOSPITAL_COMMUNITY): Payer: Self-pay

## 2022-07-02 DIAGNOSIS — Z79899 Other long term (current) drug therapy: Secondary | ICD-10-CM | POA: Insufficient documentation

## 2022-07-02 DIAGNOSIS — Z87891 Personal history of nicotine dependence: Secondary | ICD-10-CM | POA: Insufficient documentation

## 2022-07-02 DIAGNOSIS — T82868A Thrombosis of vascular prosthetic devices, implants and grafts, initial encounter: Secondary | ICD-10-CM | POA: Diagnosis present

## 2022-07-02 DIAGNOSIS — E872 Acidosis, unspecified: Secondary | ICD-10-CM | POA: Insufficient documentation

## 2022-07-02 DIAGNOSIS — I959 Hypotension, unspecified: Secondary | ICD-10-CM

## 2022-07-02 DIAGNOSIS — I10 Essential (primary) hypertension: Secondary | ICD-10-CM | POA: Diagnosis present

## 2022-07-02 DIAGNOSIS — I953 Hypotension of hemodialysis: Principal | ICD-10-CM | POA: Diagnosis present

## 2022-07-02 DIAGNOSIS — I120 Hypertensive chronic kidney disease with stage 5 chronic kidney disease or end stage renal disease: Secondary | ICD-10-CM | POA: Insufficient documentation

## 2022-07-02 DIAGNOSIS — Z992 Dependence on renal dialysis: Secondary | ICD-10-CM | POA: Insufficient documentation

## 2022-07-02 DIAGNOSIS — N186 End stage renal disease: Secondary | ICD-10-CM

## 2022-07-02 DIAGNOSIS — Z20822 Contact with and (suspected) exposure to covid-19: Secondary | ICD-10-CM | POA: Insufficient documentation

## 2022-07-02 LAB — COMPREHENSIVE METABOLIC PANEL
ALT: 18 U/L (ref 0–44)
AST: 24 U/L (ref 15–41)
Albumin: 4.5 g/dL (ref 3.5–5.0)
Alkaline Phosphatase: 68 U/L (ref 38–126)
Anion gap: 26 — ABNORMAL HIGH (ref 5–15)
BUN: 52 mg/dL — ABNORMAL HIGH (ref 6–20)
CO2: 21 mmol/L — ABNORMAL LOW (ref 22–32)
Calcium: 9.4 mg/dL (ref 8.9–10.3)
Chloride: 87 mmol/L — ABNORMAL LOW (ref 98–111)
Creatinine, Ser: 12.18 mg/dL — ABNORMAL HIGH (ref 0.61–1.24)
GFR, Estimated: 5 mL/min — ABNORMAL LOW (ref >60)
Glucose, Bld: 85 mg/dL (ref 70–99)
Potassium: 4.4 mmol/L (ref 3.5–5.1)
Sodium: 134 mmol/L — ABNORMAL LOW (ref 135–145)
Total Bilirubin: 0.6 mg/dL (ref 0.3–1.2)
Total Protein: 9.7 g/dL — ABNORMAL HIGH (ref 6.5–8.1)

## 2022-07-02 LAB — CBC WITH DIFFERENTIAL/PLATELET
Abs Immature Granulocytes: 0.09 10*3/uL — ABNORMAL HIGH (ref 0.00–0.07)
Basophils Absolute: 0.1 10*3/uL (ref 0.0–0.1)
Basophils Relative: 1 %
Eosinophils Absolute: 0 10*3/uL (ref 0.0–0.5)
Eosinophils Relative: 0 %
HCT: 47.5 % (ref 39.0–52.0)
Hemoglobin: 16.2 g/dL (ref 13.0–17.0)
Immature Granulocytes: 1 %
Lymphocytes Relative: 16 %
Lymphs Abs: 1.1 10*3/uL (ref 0.7–4.0)
MCH: 31.3 pg (ref 26.0–34.0)
MCHC: 34.1 g/dL (ref 30.0–36.0)
MCV: 91.9 fL (ref 80.0–100.0)
Monocytes Absolute: 0.5 10*3/uL (ref 0.1–1.0)
Monocytes Relative: 8 %
Neutro Abs: 5.1 10*3/uL (ref 1.7–7.7)
Neutrophils Relative %: 74 %
Platelets: 234 10*3/uL (ref 150–400)
RBC: 5.17 MIL/uL (ref 4.22–5.81)
RDW: 14.2 % (ref 11.5–15.5)
WBC: 6.9 10*3/uL (ref 4.0–10.5)
nRBC: 0 % (ref 0.0–0.2)

## 2022-07-02 LAB — I-STAT CHEM 8, ED
BUN: 50 mg/dL — ABNORMAL HIGH (ref 6–20)
Calcium, Ion: 0.83 mmol/L — CL (ref 1.15–1.40)
Chloride: 96 mmol/L — ABNORMAL LOW (ref 98–111)
Creatinine, Ser: 13.5 mg/dL — ABNORMAL HIGH (ref 0.61–1.24)
Glucose, Bld: 85 mg/dL (ref 70–99)
HCT: 52 % (ref 39.0–52.0)
Hemoglobin: 17.7 g/dL — ABNORMAL HIGH (ref 13.0–17.0)
Potassium: 4.1 mmol/L (ref 3.5–5.1)
Sodium: 133 mmol/L — ABNORMAL LOW (ref 135–145)
TCO2: 25 mmol/L (ref 22–32)

## 2022-07-02 LAB — I-STAT VENOUS BLOOD GAS, ED
Acid-Base Excess: 4 mmol/L — ABNORMAL HIGH (ref 0.0–2.0)
Bicarbonate: 25.6 mmol/L (ref 20.0–28.0)
Calcium, Ion: 0.86 mmol/L — CL (ref 1.15–1.40)
HCT: 51 % (ref 39.0–52.0)
Hemoglobin: 17.3 g/dL — ABNORMAL HIGH (ref 13.0–17.0)
O2 Saturation: 47 %
Potassium: 4.1 mmol/L (ref 3.5–5.1)
Sodium: 133 mmol/L — ABNORMAL LOW (ref 135–145)
TCO2: 27 mmol/L (ref 22–32)
pCO2, Ven: 31.2 mmHg — ABNORMAL LOW (ref 44–60)
pH, Ven: 7.522 — ABNORMAL HIGH (ref 7.25–7.43)
pO2, Ven: 23 mmHg — CL (ref 32–45)

## 2022-07-02 LAB — RESP PANEL BY RT-PCR (FLU A&B, COVID) ARPGX2
Influenza A by PCR: NEGATIVE
Influenza B by PCR: NEGATIVE
SARS Coronavirus 2 by RT PCR: NEGATIVE

## 2022-07-02 LAB — BRAIN NATRIURETIC PEPTIDE: B Natriuretic Peptide: 26.2 pg/mL (ref 0.0–100.0)

## 2022-07-02 LAB — LACTIC ACID, PLASMA: Lactic Acid, Venous: 3.4 mmol/L (ref 0.5–1.9)

## 2022-07-02 LAB — PROTIME-INR
INR: 0.9 (ref 0.8–1.2)
Prothrombin Time: 12.4 seconds (ref 11.4–15.2)

## 2022-07-02 LAB — TROPONIN I (HIGH SENSITIVITY)
Troponin I (High Sensitivity): 34 ng/L — ABNORMAL HIGH (ref <18)
Troponin I (High Sensitivity): 33 ng/L — ABNORMAL HIGH (ref <18)

## 2022-07-02 LAB — APTT: aPTT: 31 seconds (ref 24–36)

## 2022-07-02 MED ORDER — ZOLPIDEM TARTRATE 5 MG PO TABS: 5.0000 mg | ORAL_TABLET | Freq: Every evening | ORAL | Status: DC | PRN

## 2022-07-02 MED ORDER — ACETAMINOPHEN 325 MG PO TABS: 650.0000 mg | ORAL_TABLET | Freq: Four times a day (QID) | ORAL | Status: DC | PRN

## 2022-07-02 MED ORDER — PREDNISONE 50 MG PO TABS: ORAL_TABLET | ORAL | 0 refills | Status: DC

## 2022-07-02 MED ORDER — DOCUSATE SODIUM 283 MG RE ENEM: 1.0000 | ENEMA | RECTAL | Status: DC | PRN

## 2022-07-02 MED ORDER — LACTATED RINGERS IV SOLN: INTRAVENOUS | Status: DC

## 2022-07-02 MED ORDER — CALCIUM CARBONATE ANTACID 500 MG PO CHEW: 500.0000 mg | CHEWABLE_TABLET | Freq: Four times a day (QID) | ORAL | Status: DC | PRN

## 2022-07-02 MED ORDER — DIPHENHYDRAMINE HCL 25 MG PO CAPS: 50.0000 mg | ORAL_CAPSULE | Freq: Once | ORAL | Status: DC

## 2022-07-02 MED ORDER — HYDROXYZINE HCL 25 MG PO TABS: 25.0000 mg | ORAL_TABLET | Freq: Three times a day (TID) | ORAL | Status: DC | PRN

## 2022-07-02 MED ORDER — NEPRO/CARBSTEADY PO LIQD: 237.0000 mL | Freq: Three times a day (TID) | ORAL | Status: DC | PRN

## 2022-07-02 MED ORDER — HYDRALAZINE HCL 20 MG/ML IJ SOLN: 5.0000 mg | INTRAMUSCULAR | Status: DC | PRN

## 2022-07-02 MED ORDER — PREDNISONE 50 MG PO TABS: 50.0000 mg | ORAL_TABLET | Freq: Four times a day (QID) | ORAL | Status: DC

## 2022-07-02 MED ORDER — ACETAMINOPHEN 650 MG RE SUPP: 650.0000 mg | Freq: Four times a day (QID) | RECTAL | Status: DC | PRN

## 2022-07-02 MED ORDER — SORBITOL 70 % SOLN: 30.0000 mL | Status: DC | PRN

## 2022-07-02 MED ORDER — ONDANSETRON HCL 4 MG PO TABS: 4.0000 mg | ORAL_TABLET | Freq: Four times a day (QID) | ORAL | Status: DC | PRN

## 2022-07-02 MED ORDER — CALCIUM CARBONATE ANTACID 500 MG PO CHEW: 4000.0000 mg | CHEWABLE_TABLET | Freq: Three times a day (TID) | ORAL | Status: DC

## 2022-07-02 MED ORDER — LACTATED RINGERS IV BOLUS
250.0000 mL | Freq: Once | INTRAVENOUS | Status: AC
  Administered 2022-07-02: 250 mL via INTRAVENOUS

## 2022-07-02 MED ORDER — HEPARIN SODIUM (PORCINE) 5000 UNIT/ML IJ SOLN: 5000.0000 [IU] | Freq: Three times a day (TID) | INTRAMUSCULAR | Status: DC

## 2022-07-02 MED ORDER — CAMPHOR-MENTHOL 0.5-0.5 % EX LOTN: 1.0000 | TOPICAL_LOTION | Freq: Three times a day (TID) | CUTANEOUS | Status: DC | PRN

## 2022-07-02 MED ORDER — SODIUM CHLORIDE 0.9% FLUSH
3.0000 mL | Freq: Two times a day (BID) | INTRAVENOUS | Status: DC
  Administered 2022-07-02: 3 mL via INTRAVENOUS

## 2022-07-02 MED ORDER — ONDANSETRON HCL 4 MG/2ML IJ SOLN: 4.0000 mg | Freq: Four times a day (QID) | INTRAMUSCULAR | Status: DC | PRN

## 2022-07-02 MED ORDER — RENA-VITE PO TABS
1.0000 | ORAL_TABLET | Freq: Every day | ORAL | Status: DC
  Filled 2022-07-02: qty 1

## 2022-07-02 NOTE — TOC Transition Note (Signed)
Transition of Care [facility]) - CM/SW Discharge Note   Patient Details  Name: [name] MRN: [id number] Date of Birth: [birth year]  [facility] CM/SW Contact:  Bartholomew Crews, RN Phone Number: [phone] 07/02/2022, 3:53 PM   Clinical Narrative:     Spoke with patient at the bedside to discuss [facility] consult for transportation assist. Patient was at work when EMS was called and transported him to [employer]. Patient stated that he needed transportation to get to [employer], [place]. Taxi voucher provider. Charge nurse notified. No further TOC needs identified at this time.   Final next level of care: Home/Self Care Barriers to Discharge: No Barriers Identified   Patient Goals and CMS Choice Patient states their goals for this hospitalization and ongoing recovery are:: needs to get to his car so he can return home CMS Medicare.gov Compare Post Acute Care list provided to:: Patient Choice offered to / list presented to : NA  Discharge Placement                       Discharge Plan and Services                                     Social Determinants of Health (SDOH) Interventions     Readmission Risk Interventions    06/30/2022   12:29 PM  Readmission Risk Prevention Plan  Transportation Screening Complete  PCP or Specialist Appt within 5-7 Days Not Complete  Not Complete comments new PCP apt made  Home Care Screening Complete  Medication Review (RN CM) Complete

## 2022-07-02 NOTE — ED Notes (Signed)
Pt is unable to produce urine. No urinalysis will be done.

## 2022-07-02 NOTE — ED Triage Notes (Signed)
Patient arrives via [facility].  Patient reported to be at work and had dizziness and weakness.  Patient is a dialysis patient.  He had a full treatment on yesterday.  He reports he noticed he was weak when he returned home.  His pressure was low.  Today he was at work and called for assistance due to feeling weak, dizzy, and he has concerns that the dialysis catheter in the right upper leg is clotted.  No thrill noted on exam.  Patient is alert and oriented.  He is complaining of feeling cold.  He denies any recent fevers.  Bp with ems was 86/60.  CBG 132.  He has not had any daily  meds today

## 2022-07-02 NOTE — ED Notes (Signed)
ED Provider at bedside. 

## 2022-07-02 NOTE — Discharge Summary (Signed)
Physician Discharge Summary   Patient: [name] MRN: [id number] DOB: [birth year]  Admit date:     07/02/2022  Discharge date: 07/02/22  Discharge Physician: Karmen Bongo   PCP: Patient, No Pcp Per   Recommendations at discharge:    Follow up with Interventional Radiology Monday for vascular access de-clotting Hold all blood pressure medications until further notice  Discharge Diagnoses: Principal Problem:   Hypotension of hemodialysis Active Problems:   ESRD (end stage renal disease) on dialysis [facility])   Essential hypertension   Thrombosis of dialysis vascular access [facility])    Hospital Course: Patient was admitted with weakness, hypotension.  He has been seen by nephrology and this is thought to be post-HD hypotension.  He should hold his home BP medications until further notice.  He also has recurrent clotting of his HD access and can have the de-clotting procedure Monday as an outpatient prior to his next HD.  He is stable for dc at this time.  Assessment and Plan:  Post-HD hypotension -Patient with h/o HTN, takes Clonidine and Amlodipine regularly -Had HD yesterday (following hospitalization earlier in the week) and developed symptomatic hypotension upon returning home; this persisted overnight -Nephrology consulted -Would suggest holding BP meds for now  -He may need to resume slowly in the future   AV access malfunction -Patient with ESRD, has had recurrent issue with clotting access -Needs declotting once again -IR has been notified and has arranged for the procedure as an outpatient on Monday   ESRD on HD -Patient on chronic MWF HD -Nephrology consulted and thinks the patient is appropriate for discharge at this time    Consultants: Nephrology; IR by telephone only Procedures performed: None  Disposition: Home Diet recommendation:  Renal diet DISCHARGE MEDICATION: Allergies as of 07/02/2022       Reactions   Ivp Dye [iodinated Contrast Media]  Swelling   SWELLING REACTION UNSPECIFIED    Lisinopril Cough   Solu-medrol [methylprednisolone] Nausea And Vomiting        Medication List     STOP taking these medications    amLODipine 10 MG tablet Commonly known as: NORVASC   cloNIDine 0.1 MG tablet Commonly known as: Catapres       TAKE these medications    acetaminophen 500 MG tablet Commonly known as: TYLENOL Take 1,000 mg by mouth daily as needed (for pain or headaches).   DIALYVITE TABLET Tabs Take 1 tablet by mouth daily.   predniSONE 50 MG tablet Commonly known as: DELTASONE One tablet every 6 hours starting Sunday (10/1) at 2300 (11pm) for a total of 3 doses Start taking on: July 03, 2022   Tums Ultra 1000 400 MG chewable tablet Generic drug: calcium elemental as carbonate Chew 4,000 mg by mouth 3 (three) times daily with meals.        Follow-up [facility] Follow up.   Why: call to schedule a appointment to establish a primary care doctor Contact information: [address] [place] [phone]               Discharge Exam: Danley Danker Weights   07/02/22 1015 07/02/22 1405  Weight: 64 kg 65.9 kg     Condition at discharge: improving  The results of significant diagnostics from this hospitalization (including imaging, microbiology, ancillary and laboratory) are listed below for reference.   Imaging Studies: DG Chest Port 1 View  Result Date: 07/02/2022 CLINICAL DATA:  Possible sepsis. EXAM: PORTABLE CHEST 1 VIEW COMPARISON:  Chest radiograph 06/27/2022 FINDINGS: Of note, the patient is mildly rotated on this portable examination. The heart size and mediastinal contours are within normal limits. No focal consolidation, pleural effusion, or pneumothorax. No acute osseous abnormality. 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 IMPRESSION: No acute cardiopulmonary abnormality.  Electronically Signed   By: Ileana Roup M.D.   On: 07/02/2022 10:49   IR US Guide Vasc Access Right  Result Date: 06/29/2022 INDICATION: Clotted right thigh AV graft EXAM: Ultrasound-guided access of AV graft Second ultrasound-guided access of AV graft Pharmacomechanical thrombectomy of clotted AV graft Balloon angioplasty of stenotic segment of AV graft venous outflow MEDICATIONS: Per EMR; 8 mg tPA, 3000 units heparin IV Contrast material: 85 mL Omnipaque 300 ANESTHESIA/SEDATION: Moderate (conscious) sedation was employed during this procedure. A total of Versed 2.5 mg and Fentanyl 125 mcg was administered intravenously by the radiology nurse. Total intra-service moderate Sedation Time: 78 minutes. The patient's level of consciousness and vital signs were monitored continuously by radiology nursing throughout the procedure under my direct supervision. FLUOROSCOPY: Radiation Exposure Index (as provided by the fluoroscopic device): 12 minutes (25 mGy) COMPLICATIONS: None immediate. PROCEDURE: Informed written consent was obtained from the patient after a thorough discussion of the procedural risks, benefits and alternatives. All questions were addressed. Maximal Sterile Barrier Technique was utilized including caps, mask, sterile gowns, sterile gloves, sterile drape, hand hygiene and skin antiseptic. A timeout was performed prior to the initiation of the procedure. The patient was placed supine on the exam table. The right thigh was prepped and draped in the standard sterile fashion with inclusion of the AV graft within the sterile field. Ultrasound was used to evaluate the AV graft, which found to be clotted. An ultrasound image was permanently stored in the electronic medical record. Using ultrasound guidance, the AV graft was directly punctured using a 21 gauge micropuncture set in an antegrade fashion near the arterial anastomosis. An 018 wire was advanced centrally, followed by serial tract dilation and  placement of a short 7 French sheath. Using a combination of angled catheter and 035 wire, wire and catheter were advanced into the central venous outflow past the iliac vein stent. Pull-back venogram of the venous outflow was then performed to determine the extent of clot. The iliac vein was found to be patent. The clot extended to the venous anastomosis. The clot within the graft was then laced with 8 mg tPA. While this was allowed to dwell, a second retrograde access was obtained. Ultrasound was used to evaluate the AV graft, which was again found to be clotted. An ultrasound image was permanently stored in the electronic medical record. Using ultrasound guidance, the AV graft was directly punctured using a 21 gauge micropuncture set in an retrograde fashion near the apex of the loop. An 018 wire was easily advanced, followed by serial tract dilation and placement of a short 6 French sheath. After an appropriate dwell time of the tPA, mechanical thrombectomy of the clotted AV graft was then performed using a 7 mm balloon. Pulling of the arterial plug was then performed using the retrograde access with multiple sweeps with the Fogarty balloon. Angiography of the AV graft at this time demonstrated patent flow through the AV graft. There remains some residual clot near the apex of the loop graft which was successfully treated with repeat angioplasty. The venous outflow in the iliac vein stent was then restudy. There was an area of moderate 50-70%  stenosis of the peripheral aspect of the iliac vein stent. This was successfully treated with balloon angioplasty up to 12 mm. There was appropriate luminal gain after angioplasty, with fast flow through the AV graft. At the end of the procedure, all wires and catheters were removed. Hemostasis was achieved at the access site using a combination of 0 silk suture and manual pressure. Clean dressings were placed. The patient tolerated the procedure well without immediate  complication. IMPRESSION: 1. Successful pharmacomechanical thrombectomy of AV graft in the right thigh. 2. Successful balloon angioplasty of stenotic segment of the iliac vein stent up to 12 mm. ACCESS: This access remains amenable to future percutaneous interventions as clinically indicated. Electronically Signed   By: Albin Felling M.D.   On: 06/29/2022 13:56   IR THROMBECTOMY AV FISTULA/W THROMBOLYSIS/PTA INC SHUNT/IMG RIGHT  Result Date: 06/29/2022 INDICATION: Clotted right thigh AV graft EXAM: Ultrasound-guided access of AV graft Second ultrasound-guided access of AV graft Pharmacomechanical thrombectomy of clotted AV graft Balloon angioplasty of stenotic segment of AV graft venous outflow MEDICATIONS: Per EMR; 8 mg tPA, 3000 units heparin IV Contrast material: 85 mL Omnipaque 300 ANESTHESIA/SEDATION: Moderate (conscious) sedation was employed during this procedure. A total of Versed 2.5 mg and Fentanyl 125 mcg was administered intravenously by the radiology nurse. Total intra-service moderate Sedation Time: 78 minutes. The patient's level of consciousness and vital signs were monitored continuously by radiology nursing throughout the procedure under my direct supervision. FLUOROSCOPY: Radiation Exposure Index (as provided by the fluoroscopic device): 12 minutes (25 mGy) COMPLICATIONS: None immediate. PROCEDURE: Informed written consent was obtained from the patient after a thorough discussion of the procedural risks, benefits and alternatives. All questions were addressed. Maximal Sterile Barrier Technique was utilized including caps, mask, sterile gowns, sterile gloves, sterile drape, hand hygiene and skin antiseptic. A timeout was performed prior to the initiation of the procedure. The patient was placed supine on the exam table. The right thigh was prepped and draped in the standard sterile fashion with inclusion of the AV graft within the sterile field. Ultrasound was used to evaluate the AV graft,  which found to be clotted. An ultrasound image was permanently stored in the electronic medical record. Using ultrasound guidance, the AV graft was directly punctured using a 21 gauge micropuncture set in an antegrade fashion near the arterial anastomosis. An 018 wire was advanced centrally, followed by serial tract dilation and placement of a short 7 French sheath. Using a combination of angled catheter and 035 wire, wire and catheter were advanced into the central venous outflow past the iliac vein stent. Pull-back venogram of the venous outflow was then performed to determine the extent of clot. The iliac vein was found to be patent. The clot extended to the venous anastomosis. The clot within the graft was then laced with 8 mg tPA. While this was allowed to dwell, a second retrograde access was obtained. Ultrasound was used to evaluate the AV graft, which was again found to be clotted. An ultrasound image was permanently stored in the electronic medical record. Using ultrasound guidance, the AV graft was directly punctured using a 21 gauge micropuncture set in an retrograde fashion near the apex of the loop. An 018 wire was easily advanced, followed by serial tract dilation and placement of a short 6 French sheath. After an appropriate dwell time of the tPA, mechanical thrombectomy of the clotted AV graft was then performed using a 7 mm balloon. Pulling of the arterial plug was then performed using  the retrograde access with multiple sweeps with the Fogarty balloon. Angiography of the AV graft at this time demonstrated patent flow through the AV graft. There remains some residual clot near the apex of the loop graft which was successfully treated with repeat angioplasty. The venous outflow in the iliac vein stent was then restudy. There was an area of moderate 50-70% stenosis of the peripheral aspect of the iliac vein stent. This was successfully treated with balloon angioplasty up to 12 mm. There was  appropriate luminal gain after angioplasty, with fast flow through the AV graft. At the end of the procedure, all wires and catheters were removed. Hemostasis was achieved at the access site using a combination of 0 silk suture and manual pressure. Clean dressings were placed. The patient tolerated the procedure well without immediate complication. IMPRESSION: 1. Successful pharmacomechanical thrombectomy of AV graft in the right thigh. 2. Successful balloon angioplasty of stenotic segment of the iliac vein stent up to 12 mm. ACCESS: This access remains amenable to future percutaneous interventions as clinically indicated. Electronically Signed   By: Albin Felling M.D.   On: 06/29/2022 13:56   IR Fluoro Guide CV Line Right  Result Date: 06/28/2022 INDICATION: 51 year old with end-stage renal disease and occluded AV graft. Patient needs temporary dialysis catheter. EXAM: FLUOROSCOPIC AND ULTRASOUND GUIDED PLACEMENT OF A NON-TUNNELED DIALYSIS CATHETER Physician: Stephan Minister. Henn, MD MEDICATIONS: 1% lidocaine for local anesthetic ANESTHESIA/SEDATION: None FLUOROSCOPY TIME:  Radiation Exposure Index (as provided by the fluoroscopic device): 1 mGy Kerma COMPLICATIONS: None immediate. PROCEDURE: The procedure was explained to the patient. The risks and benefits of the procedure were discussed and the patient's questions were addressed. Informed consent was obtained from the patient. The patient was placed supine on the interventional table. Ultrasound confirmed a patent right internal jugular vein. Ultrasound images were obtained for documentation. The right neck was prepped and draped in a sterile fashion. The right neck was anesthetized with 1% lidocaine. Maximal barrier sterile technique was utilized including caps, mask, sterile gowns, sterile gloves, sterile drape, hand hygiene and skin antiseptic. A small incision was made with #11 blade scalpel. A 21 gauge needle directed into the right internal jugular vein with  ultrasound guidance. A micropuncture dilator set was placed. A 20 cm Mahurkar catheter was selected. The catheter was advanced over a wire and positioned at the superior cavoatrial junction. Fluoroscopic images were obtained for documentation. Both dialysis lumens were found to aspirate and flush well. The proper amount of heparin was flushed in both lumens. The central venous lumen was flushed with normal saline. Catheter was sutured to skin. Fluoroscopic and ultrasound images were taken and saved for documentation. FINDINGS: Catheter tip at the superior cavoatrial junction. IMPRESSION: Successful placement of a right jugular non-tunneled dialysis catheter using ultrasound and fluoroscopic guidance. Electronically Signed   By: Markus Daft M.D.   On: 06/28/2022 08:19   IR US Guide Vasc Access Right  Result Date: 06/28/2022 INDICATION: 51 year old with end-stage renal disease and occluded AV graft. Patient needs temporary dialysis catheter. EXAM: FLUOROSCOPIC AND ULTRASOUND GUIDED PLACEMENT OF A NON-TUNNELED DIALYSIS CATHETER Physician: Stephan Minister. Henn, MD MEDICATIONS: 1% lidocaine for local anesthetic ANESTHESIA/SEDATION: None FLUOROSCOPY TIME:  Radiation Exposure Index (as provided by the fluoroscopic device): 1 mGy Kerma COMPLICATIONS: None immediate. PROCEDURE: The procedure was explained to the patient. The risks and benefits of the procedure were discussed and the patient's questions were addressed. Informed consent was obtained from the patient. The patient was placed supine on the interventional  table. Ultrasound confirmed a patent right internal jugular vein. Ultrasound images were obtained for documentation. The right neck was prepped and draped in a sterile fashion. The right neck was anesthetized with 1% lidocaine. Maximal barrier sterile technique was utilized including caps, mask, sterile gowns, sterile gloves, sterile drape, hand hygiene and skin antiseptic. A small incision was made with #11 blade  scalpel. A 21 gauge needle directed into the right internal jugular vein with ultrasound guidance. A micropuncture dilator set was placed. A 20 cm Mahurkar catheter was selected. The catheter was advanced over a wire and positioned at the superior cavoatrial junction. Fluoroscopic images were obtained for documentation. Both dialysis lumens were found to aspirate and flush well. The proper amount of heparin was flushed in both lumens. The central venous lumen was flushed with normal saline. Catheter was sutured to skin. Fluoroscopic and ultrasound images were taken and saved for documentation. FINDINGS: Catheter tip at the superior cavoatrial junction. IMPRESSION: Successful placement of a right jugular non-tunneled dialysis catheter using ultrasound and fluoroscopic guidance. Electronically Signed   By: Markus Daft M.D.   On: 06/28/2022 08:19   DG Chest 2 View  Result Date: 06/27/2022 CLINICAL DATA:  Shortness of breath. EXAM: CHEST - 2 VIEW COMPARISON:  Chest x-ray 06/12/2022. FINDINGS: The heart size and mediastinal contours are within normal limits. Both lungs are clear. No visible pleural effusions or pneumothorax. No acute osseous abnormality. IMPRESSION: No evidence of acute cardiopulmonary disease. Electronically Signed   By: Margaretha Sheffield M.D.   On: 06/27/2022 10:45   VAS [place] ABI WITH/WO TBI  Result Date: 06/23/2022  LOWER EXTREMITY DOPPLER STUDY Patient Name:  Zeferino Mounts [facility]  Date of Exam:   06/23/2022 Medical Rec #: [id number]              Accession #:    [id number] Date of Birth: 1971-02-22              Patient Gender: M Patient Age:   31 years Exam Location:  [facility] Procedure:      VAS [place] ABI WITH/WO TBI Referring Phys: Lawson Radar --------------------------------------------------------------------------------  Indications: Pain and tingling of feet High Risk Factors: Hypertension. Other Factors: ESRD, neuropathy.  Comparison Study: No prior study Performing Technologist:  Sharion Dove RVS  Examination Guidelines: A complete evaluation includes at minimum, Doppler waveform signals and systolic blood pressure reading at the level of bilateral brachial, anterior tibial, and posterior tibial arteries, when vessel segments are accessible. Bilateral testing is considered an integral part of a complete examination. Photoelectric Plethysmograph (PPG) waveforms and toe systolic pressure readings are included as required and additional duplex testing as needed. Limited examinations for reoccurring indications may be performed as noted.  ABI Findings: +---------+------------------+-----+-----------+--------+ Right    Rt Pressure (mmHg)IndexWaveform   Comment  +---------+------------------+-----+-----------+--------+ Brachial 137                    triphasic           +---------+------------------+-----+-----------+--------+ PTA      155               1.13 multiphasic         +---------+------------------+-----+-----------+--------+ DP       156               1.14 multiphasic         +---------+------------------+-----+-----------+--------+ Great Toe60                0.44                     +---------+------------------+-----+-----------+--------+ +---------+------------------+-----+-----------+---------+  Left     Lt Pressure (mmHg)IndexWaveform   Comment   +---------+------------------+-----+-----------+---------+ Brachial                                   Old graft +---------+------------------+-----+-----------+---------+ PTA      151               1.10 multiphasic          +---------+------------------+-----+-----------+---------+ DP       162               1.18 multiphasic          +---------+------------------+-----+-----------+---------+ Great Toe78                0.57                      +---------+------------------+-----+-----------+---------+ +-------+-----------+-----------+------------+------------+ ABI/TBIToday's  ABIToday's TBIPrevious ABIPrevious TBI +-------+-----------+-----------+------------+------------+ Right  1.14       0.44                                +-------+-----------+-----------+------------+------------+ Left   1.18       0.57                                +-------+-----------+-----------+------------+------------+   Summary: Right: Resting right ankle-brachial index is within normal range. The right toe-brachial index is abnormal. Left: Resting left ankle-brachial index is within normal range. The left toe-brachial index is abnormal. *See table(s) above for measurements and observations.  Electronically signed by Monica Martinez MD on 06/23/2022 at 12:38:38 PM.    Final    VAS [place] LOWER EXTREMITY VENOUS (DVT)  Result Date: 06/23/2022  Lower Venous DVT Study Patient Name:  DIERKS WACH  Date of Exam:   06/23/2022 Medical Rec #: [id number]              Accession #:    [id number] Date of Birth: 03-14-1971              Patient Gender: M Patient Age:   16 years Exam Location:  [facility] Procedure:      VAS [place] LOWER EXTREMITY VENOUS (DVT) Referring Phys: Lawson Radar --------------------------------------------------------------------------------  Indications: Numbness and tingling of left foot.  Comparison Study: No prior left LEV Performing Technologist: Sharion Dove RVS  Examination Guidelines: A complete evaluation includes B-mode imaging, spectral Doppler, color Doppler, and power Doppler as needed of all accessible portions of each vessel. Bilateral testing is considered an integral part of a complete examination. Limited examinations for reoccurring indications may be performed as noted. The reflux portion of the exam is performed with the patient in reverse Trendelenburg.  +-----+---------------+---------+-----------+----------+--------------+ RIGHTCompressibilityPhasicitySpontaneityPropertiesThrombus Aging  +-----+---------------+---------+-----------+----------+--------------+ CFV  Full           Yes      Yes                                 +-----+---------------+---------+-----------+----------+--------------+ Incidental finding: Right lower extremity dialysis access is thrombosed  +---------+---------------+---------+-----------+----------+--------------+ LEFT     CompressibilityPhasicitySpontaneityPropertiesThrombus Aging +---------+---------------+---------+-----------+----------+--------------+ CFV      Full           Yes      Yes                                 +---------+---------------+---------+-----------+----------+--------------+  SFJ      Full                                                        +---------+---------------+---------+-----------+----------+--------------+ FV Prox  Full                                                        +---------+---------------+---------+-----------+----------+--------------+ FV Mid   Full                                                        +---------+---------------+---------+-----------+----------+--------------+ FV DistalFull                                                        +---------+---------------+---------+-----------+----------+--------------+ PFV      Full                                                        +---------+---------------+---------+-----------+----------+--------------+ POP      Full           Yes      Yes                                 +---------+---------------+---------+-----------+----------+--------------+ PTV      Full                                                        +---------+---------------+---------+-----------+----------+--------------+ PERO     Full                                                        +---------+---------------+---------+-----------+----------+--------------+     Summary: RIGHT: - No evidence of common femoral vein  obstruction. 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  LEFT: - There is no evidence of deep vein thrombosis in the lower extremity.  *See table(s) above for measurements and observations. Electronically signed by Monica Martinez MD on 06/23/2022 at 12:38:24 PM.    Final    CT Head Wo Contrast  Result Date: 06/12/2022 CLINICAL DATA:  BLUNT POLY TRAUMA, DIZZINESS AND CHEST PAIN, HEADACHE FOR 2 WEEKS. EXAM: CT HEAD WITHOUT CONTRAST TECHNIQUE: Contiguous axial images were obtained from the base of the skull through the vertex without intravenous contrast. RADIATION DOSE REDUCTION: This exam was  performed according to the departmental dose-optimization program which includes automated exposure control, adjustment of the mA and/or kV according to patient size and/or use of iterative reconstruction technique. COMPARISON:  HEAD CT 03/27/2023 FINDINGS: Brain: There is mild global atrophy including centrally but it is greater than typically seen in a 50 year old. The gray-white matter differentiation and attenuation are normal. No infarct, hemorrhage or mass are seen. Ventricles are normal in size and position. Basal cisterns are clear. Vascular: There are scattered calcifications of the carotid siphons. There are no hyperdense central vessels. Skull: Negative for fracture or focal lesion. No scalp hematoma is seen. Sinuses/Orbits: No acute abnormality. Unremarkable orbital contents. 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 IMPRESSION: No acute intracranial CT findings, depressed skull fractures or interval changes. Electronically Signed   By: Telford Nab M.D.   On: 06/12/2022 07:27   DG Chest Portable 1 View  Result Date: 06/12/2022 CLINICAL DATA:  Short of breath with dizziness. EXAM: PORTABLE CHEST 1 VIEW COMPARISON:  03/28/2022  FINDINGS: Heart size and mediastinal contours are unremarkable. There is no pleural effusion or edema identified. No airspace opacities identified. IMPRESSION: No active disease. Electronically Signed   By: Kerby Moors M.D.   On: 06/12/2022 06:41    Microbiology: Results for orders placed or performed during the hospital encounter of 07/02/22  Blood Culture (routine x 2)     Status: None (Preliminary result)   Collection Time: 07/02/22  9:48 AM   Specimen: BLOOD  Result Value Ref Range Status   Specimen Description BLOOD SITE NOT SPECIFIED  Final   Special Requests   Final    BOTTLES DRAWN AEROBIC AND ANAEROBIC Blood Culture adequate volume   Culture   Final    NO GROWTH <12 HOURS Performed at [facility], [address] [place], [place] [address]    Report Status PENDING  Incomplete  Resp Panel by RT-PCR (Flu A&B, Covid) Anterior Nasal Swab     Status: None   Collection Time: 07/02/22 10:25 AM   Specimen: Anterior Nasal Swab  Result Value Ref Range Status   SARS Coronavirus 2 by RT PCR NEGATIVE NEGATIVE Final    Comment: (NOTE) SARS-CoV-2 target nucleic acids are NOT DETECTED.  The SARS-CoV-2 RNA is generally detectable in upper respiratory specimens during the acute phase of infection. The lowest concentration of SARS-CoV-2 viral copies this assay can detect is 138 copies/mL. A negative result does not preclude SARS-Cov-2 infection and should not be used as the sole basis for treatment or other patient management decisions. A negative result may occur with  improper specimen collection/handling, submission of specimen other than nasopharyngeal swab, presence of viral mutation(s) within the areas targeted by this assay, and inadequate number of viral copies(<138 copies/mL). A negative result must be combined with clinical observations, patient history, and epidemiological information. The expected result is Negative.  Fact Sheet for Patients:   EntrepreneurPulse.com.au  Fact Sheet for Healthcare Providers:  IncredibleEmployment.be  This test is no t yet approved or cleared by the [place] FDA and  has been authorized for detection and/or diagnosis of SARS-CoV-2 by FDA under an Emergency Use Authorization (EUA). This EUA will remain  in effect (meaning this test can be used) for the duration of the COVID-19 declaration under Section 564(b)(1) of the Act, 21 U.S.C.section 360bbb-3(b)(1), unless the authorization is  terminated  or revoked sooner.       Influenza A by PCR NEGATIVE NEGATIVE Final   Influenza B by PCR NEGATIVE NEGATIVE Final    Comment: (NOTE) The Xpert Xpress SARS-CoV-2/FLU/RSV plus assay is intended as an aid in the diagnosis of influenza from Nasopharyngeal swab specimens and should not be used as a sole basis for treatment. Nasal washings and aspirates are unacceptable for Xpert Xpress SARS-CoV-2/FLU/RSV testing.  Fact Sheet for Patients: EntrepreneurPulse.com.au  Fact Sheet for Healthcare Providers: IncredibleEmployment.be  This test is not yet approved or cleared by the [place] FDA and has been authorized for detection and/or diagnosis of SARS-CoV-2 by FDA under an Emergency Use Authorization (EUA). This EUA will remain in effect (meaning this test can be used) for the duration of the COVID-19 declaration under Section 564(b)(1) of the Act, 21 U.S.C. section 360bbb-3(b)(1), unless the authorization is terminated or revoked.  Performed at [facility], [place] [address] [place], [place] [address]     Labs: CBC: Recent Labs  Lab 06/27/22 1029 06/28/22 0332 06/29/22 0135 06/29/22 2236 07/02/22 0948 07/02/22 0951 07/02/22 0952  WBC 4.2 6.1 6.2  --  6.9  --   --   NEUTROABS 4.0  --   --   --  5.1  --   --   HGB 13.8 14.0 13.6 14.7 16.2 17.3* 17.7*  HCT 41.4 39.7 40.6 41.8 47.5 51.0 52.0  MCV 93.9 89.6  93.1  --  91.9  --   --   PLT 229 256 222  --  234  --   --    Basic Metabolic Panel: Recent Labs  Lab 06/27/22 2023 06/28/22 0332 06/29/22 0135 06/29/22 0839 06/30/22 0511 07/02/22 0948 07/02/22 0951 07/02/22 0952  NA 138 135 135  --  136 134* 133* 133*  K 6.1* 4.3 6.5* 4.9 4.2 4.4 4.1 4.1  CL 90* 89* 94*  --  88* 87*  --  96*  CO2 18* 27 23  --  29 21*  --   --   GLUCOSE 110* 203* 132*  --  134* 85  --  85  BUN 124* 47* 70*  --  46* 52*  --  50*  CREATININE 24.66* 13.53* 15.13*  --  11.02* 12.18*  --  13.50*  CALCIUM 8.2* 8.8* 7.7*  --  8.2* 9.4  --   --   PHOS 6.8*  --   --   --  6.9*  --   --   --    Liver Function Tests: Recent Labs  Lab 06/27/22 1029 06/27/22 2023 06/30/22 0511 07/02/22 0948  AST 13*  --   --  24  ALT 14  --   --  18  ALKPHOS 65  --   --  68  BILITOT 0.8  --   --  0.6  PROT 9.1*  --   --  9.7*  ALBUMIN 4.2 3.7 3.3* 4.5   CBG: No results for input(s): "GLUCAP" in the last 168 hours.  Discharge time spent: less than 30 minutes.  Signed: Karmen Bongo, MD [facility] 07/02/2022

## 2022-07-02 NOTE — H&P (Signed)
History and Physical    Patient: [name] DOB: November 09, 1970 DOA: 07/02/2022 DOS: the patient was seen and examined on 07/02/2022 PCP: Patient, No Pcp Per  Patient coming from: Home - lives with roommate; NOK:  Martie Round, [phone]   Chief Complaint: Dizziness, weakness  HPI: [name] is a 51 y.o. male with medical history significant of HTN and ESRD on MWF HD presenting with dizziness, weakness.  He was last admitted from 9/25-28 for hyperkalemia associated with missed HD.  He also had a clotted AV graft for which he underwent successful declotting procedure by IR on 9/27.  He went to HD yesterday, full session and no events.  He got home and his BP dropped (SBP 75) and his graft got clotted again.  He went to work this AM and started feeling light-headed and dizzy.  He feels ok now.  No CP.    ER Course:  HD patient, recently hospitalized.  Back feeling light-headed and weak.  Had HD yesterday and last night was hypotensive in 70s.  Got symptoms later in the night.  Went to work this AM and symptoms worsened.  BP with MAP in 50s here.  Given 250 cc bolus x 2, BP in low 100s. Lactate 3.4.  K+ normal.  Can't feel a thrill in fistula, IR will consult since they declotted Wednesday.      Review of Systems: As mentioned in the history of present illness. All other systems reviewed and are negative. Past Medical History:  Diagnosis Date   Anemia    Anxiety    Depression    ESRD on hemodialysis ([facility])    HD [facility] MWF   Hypertension    Thyroid disease    Past Surgical History:  Procedure Laterality Date   ARTERIOVENOUS GRAFT PLACEMENT Left    "forearm, it's not working; thigh"    ARTERIOVENOUS GRAFT PLACEMENT Left 11/09/2015   AV FISTULA PLACEMENT Right 01/10/2017   Procedure: INSERTION OF ARTERIOVENOUS Right thigh GORE-TEX GRAFT;  Surgeon: [name], MD;  Location: [facility];  Service: Vascular;  Laterality: Right;    FALSE ANEURYSM REPAIR Left 11/09/2015   Procedure: REPAIR OF LEFT FEMORAL PSEUDOANEURYSM; REVISION  OF LEFT THIGH ARTERIOVENOUS GRAFT USING 6MM X 10 CM GORETEX GRAFT ;  Surgeon: [name], MD;  Location: [facility] OR;  Service: Vascular;  Laterality: Left;   IR AV DIALY SHUNT INTRO NEEDLE/INTRACATH INITIAL W/PTA/IMG RIGHT Right 02/06/2019   IR AV DIALY SHUNT INTRO NEEDLE/INTRACATH INITIAL W/PTA/IMG RIGHT Right 05/14/2019   IR AV DIALY SHUNT INTRO NEEDLE/INTRACATH INITIAL W/PTA/IMG RIGHT Right 08/18/2020   IR AV DIALY SHUNT INTRO NEEDLE/INTRACATH INITIAL W/PTA/IMG RIGHT Right 04/21/2022   IR DIALY SHUNT INTRO NEEDLE/INTRACATH INITIAL W/IMG RIGHT Right 09/17/2019   IR DIALY SHUNT INTRO NEEDLE/INTRACATH INITIAL W/IMG RIGHT Right 12/10/2019   IR FLUORO GUIDE CV LINE RIGHT  07/10/2021   IR FLUORO GUIDE CV LINE RIGHT  06/27/2022   IR GENERIC HISTORICAL  07/15/2016   IR US GUIDE VASC ACCESS LEFT 07/15/2016 [name], MD [facility]   IR GENERIC HISTORICAL Left 07/15/2016   IR THROMBECTOMY AV FISTULA W/THROMBOLYSIS/PTA INC/SHUNT/IMG LEFT 07/15/2016 [name], MD [facility]   IR GENERIC HISTORICAL  10/05/2016   IR US GUIDE VASC ACCESS LEFT 10/05/2016 [name], MD [facility]   IR GENERIC HISTORICAL Left 10/05/2016   IR THROMBECTOMY AV FISTULA W/THROMBOLYSIS/PTA INC/SHUNT/IMG LEFT 10/05/2016 [name], MD [facility]   IR GENERIC HISTORICAL  10/08/2016  IR FLUORO GUIDE CV LINE RIGHT 10/08/2016 [name], MD [facility]   IR GENERIC HISTORICAL  10/08/2016   IR US GUIDE VASC ACCESS RIGHT 10/08/2016 [name], MD [facility]   IR PTA AND STENT ADDL CENTRAL DIALY SEG THRU DIALY CIRCUIT RIGHT Right 12/10/2019   IR RADIOLOGIST EVAL & MGMT  11/26/2019   IR REMOVAL TUN CV CATH W/O FL  03/16/2017   IR REMOVAL TUN CV CATH W/O FL  07/22/2021   IR THROMBECTOMY AV FISTULA W/THROMBOLYSIS/PTA INC/SHUNT/IMG RIGHT Right 06/16/2018   IR THROMBECTOMY AV FISTULA W/THROMBOLYSIS/PTA INC/SHUNT/IMG RIGHT Right 06/17/2018    IR THROMBECTOMY AV FISTULA W/THROMBOLYSIS/PTA INC/SHUNT/IMG RIGHT Right 07/15/2021   IR THROMBECTOMY AV FISTULA W/THROMBOLYSIS/PTA INC/SHUNT/IMG RIGHT Right 06/29/2022   IR US GUIDE VASC ACCESS RIGHT  06/17/2018   IR US GUIDE VASC ACCESS RIGHT  06/16/2018   IR US GUIDE VASC ACCESS RIGHT  02/06/2019   IR US GUIDE VASC ACCESS RIGHT  05/14/2019   IR US GUIDE VASC ACCESS RIGHT  09/17/2019   IR US GUIDE VASC ACCESS RIGHT  12/10/2019   IR US GUIDE VASC ACCESS RIGHT  08/18/2020   IR US GUIDE VASC ACCESS RIGHT  07/10/2021   IR US GUIDE VASC ACCESS RIGHT  07/19/2021   IR US GUIDE VASC ACCESS RIGHT  04/21/2022   IR US GUIDE VASC ACCESS RIGHT  06/27/2022   IR US GUIDE VASC ACCESS RIGHT  06/29/2022   PARATHYROIDECTOMY N/A 04/24/2014   Procedure: TOTAL 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;  Surgeon: [name], MD;  Location: [facility];  Service: General;  Laterality: N/A;  NECK AND LEFT FOREARM   PERIPHERAL VASCULAR CATHETERIZATION N/A 05/05/2016   Procedure: A/V Shuntogram;  Surgeon: Conrad Kingston, MD;  Location: [facility];  Service: Cardiovascular;  Laterality: N/A;   PERIPHERAL VASCULAR CATHETERIZATION Left 05/05/2016   Procedure: Peripheral Vascular Balloon Angioplasty;  Surgeon: Conrad Howard, MD;  Location: [facility];  Service: Cardiovascular;  Laterality: Left;   PSEUDOANEURYSM REPAIR Left 11/09/2015   TEE WITHOUT CARDIOVERSION N/A 02/19/2021   Procedure: TRANSESOPHAGEAL ECHOCARDIOGRAM (TEE);  Surgeon: [name], MD;  Location: [facility] ENDOSCOPY;  Service: Cardiovascular;  Laterality: N/A;   THROMBECTOMY / ARTERIOVENOUS GRAFT REVISION Left 08/18/2015   thigh   THROMBECTOMY AND REVISION OF ARTERIOVENTOUS (AV) GORETEX  GRAFT Left 08/23/2015   Procedure: THROMBECTOMY AND REVISION OF ARTERIOVENTOUS (AV) GORETEX  GRAFT LEFT THIGH ;  Surgeon: [name], MD;  Location: [facility];  Service: Vascular;  Laterality: Left;   THROMBECTOMY W/ EMBOLECTOMY Left 08/18/2015   Procedure:  THROMBECTOMY  AND REVISION ARTERIOVENOUS GORE-TEX GRAFT/LEFT THIGH;  Surgeon: [name], MD;  Location: [facility];  Service: Vascular;  Laterality: Left;   THROMBECTOMY W/ EMBOLECTOMY Right 06/19/2018   Procedure: THROMBECTOMY AND REVISION OF RIGHT THIGH  ARTERIOVENOUS GORE-TEX GRAFT;  Surgeon: [name], MD;  Location: [facility] OR;  Service: Vascular;  Laterality: Right;   UPPER EXTREMITY VENOGRAPHY Bilateral 12/27/2016   Procedure: Upper Extremity Venography;  Surgeon: [name], MD;  Location: [facility];  Service: Cardiovascular;  Laterality: Bilateral;   VENOGRAM Left 05/05/2016   Procedure: Venogram;  Surgeon: Conrad Moscow, MD;  Location: [facility];  Service: Cardiovascular;  Laterality: Left;  lower extremity   Social History:  reports that he quit smoking about 36 years ago. His smoking use included cigarettes. He has never used smokeless tobacco. He reports that he does not drink alcohol and does  not use drugs.  Allergies  Allergen Reactions   Ivp Dye [Iodinated Contrast Media] Swelling    SWELLING REACTION UNSPECIFIED    Lisinopril Cough   Solu-Medrol [Methylprednisolone] Nausea And Vomiting    Family History  Problem Relation Age of Onset   Renal Disease Neg Hx     Prior to Admission medications   Medication Sig Start Date End Date Taking? Authorizing Provider  acetaminophen (TYLENOL) 500 MG tablet Take 1,000 mg by mouth daily as needed (for pain or headaches).     [provider]  amLODipine (NORVASC) 10 MG tablet Take 1 tablet (10 mg total) by mouth daily at 2 PM. 07/01/22   Ghimire, Henreitta Leber, MD  B Complex-C-Folic Acid (DIALYVITE TABLET) TABS Take 1 tablet by mouth daily. 06/29/20   [provider]  cloNIDine (CATAPRES) 0.1 MG tablet Take 1 tablet (0.1 mg total) by mouth at bedtime. 07/01/22   Ghimire, Henreitta Leber, MD  TUMS ULTRA 1000 1000 MG chewable tablet Chew 4,000 mg by mouth 3 (three) times daily with meals. 03/14/20    [provider]  fluticasone (FLONASE) 50 MCG/ACT nasal spray Place 2 sprays into both nostrils daily. Patient not taking: Reported on 06/15/2018 07/25/17 02/20/20  Larene Pickett, PA-C    Physical Exam: Vitals:   07/02/22 1045 07/02/22 1100 07/02/22 1115 07/02/22 1130  BP: 111/76 104/80 103/77 104/77  Pulse:    82  Resp: (!) 21 15 (!) 21 18  Temp:    98.2 F (36.8 C)  TempSrc:    Oral  SpO2:    100%  Weight:      Height:       General:  Appears calm and comfortable and is in NAD Eyes:   EOMI, normal lids, iris ENT:  grossly normal hearing, lips & tongue, mmm Neck:  no LAD, masses or thyromegaly Cardiovascular:  RRR, no m/r/g. No LE edema. No thrill appreciated in R groin access Respiratory:   CTA bilaterally with no wheezes/rales/rhonchi.  Normal respiratory effort. Abdomen:  soft, NT, ND Skin:  no rash or induration seen on limited exam Musculoskeletal:  grossly normal tone BUE/BLE, good ROM, no bony abnormality Psychiatric:  blunted mood and affect, speech fluent and appropriate, AOx3 Neurologic:  CN 2-12 grossly intact, moves all extremities in coordinated fashion   Radiological Exams on Admission: Independently reviewed - see discussion in A/P where applicable  DG Chest Port 1 View  Result Date: 07/02/2022 CLINICAL DATA:  Possible sepsis. EXAM: PORTABLE CHEST 1 VIEW COMPARISON:  Chest radiograph 06/27/2022 FINDINGS: Of note, the patient is mildly rotated on this portable examination. The heart size and mediastinal contours are within normal limits. No focal consolidation, pleural effusion, or pneumothorax. No acute osseous abnormality. 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 IMPRESSION: No acute cardiopulmonary abnormality. Electronically Signed   By: Ileana Roup M.D.   On: 07/02/2022 10:49    EKG: Independently reviewed.   1013 - NSR with rate 84; LVH; nonspecific ST changes with no evidence of acute ischemia 1217 - NSR with rate 84; LVH;  nonspecific ST changes with no evidence of acute ischemia   Labs on Admission: I have personally reviewed the available labs and imaging studies at the time of the admission.  Pertinent labs:    VBG: 7.522/31.2/25.6 BUN 52/Creatinine 12.18/GFR 5 Anion gap 26 BNP 26.2 HS troponin 34 Lactate 3.4 Normal CBC INR 0.9 COVID pending   Assessment and Plan: Principal Problem:   Hypotension of hemodialysis Active Problems:  ESRD (end stage renal disease) on dialysis [facility])   Essential hypertension   Thrombosis of dialysis vascular access (HCC)     Post-HD hypotension -Patient with h/o HTN, takes Clonidine and Amlodipine regularly -Had HD yesterday (following hospitalization earlier in the week) and developed symptomatic hypotension upon returning home; this has persisted overnight -Will observe overnight on telemetry -Nephrology consulted -Would suggest either stopping HTN medication or holding post-HD -Will hold BP meds for now and cover with prn IV hydralazine  AV access malfunction -Patient with ESRD, has had recurrent issue with clotting access -Needs declotting once again -IR has been notified   ESRD on HD -Patient on chronic MWF HD -Nephrology prn order set utilized -Nephrology is consulting     Advance Care Planning:   Code Status: Full Code   Consults: Nephrology; IR  DVT Prophylaxis: Heparin  Family Communication: None present; he declined to have me call his sister at the time of admission  Severity of Illness: The appropriate patient status for this patient is OBSERVATION. Observation status is judged to be reasonable and necessary in order to provide the required intensity of service to ensure the patient's safety. The patient's presenting symptoms, physical exam findings, and initial radiographic and laboratory data in the context of their medical condition is felt to place them at decreased risk for further clinical deterioration. Furthermore, it is  anticipated that the patient will be medically stable for discharge from the hospital within 2 midnights of admission.   Author: Karmen Bongo, MD 07/02/2022 1:08 PM  For on call review [email or web address].

## 2022-07-02 NOTE — Progress Notes (Signed)
DISCHARGE NOTE HOME [name] to be discharged Home per MD order. Discussed prescriptions and follow up appointments with the patient. Prescriptions given to patient; medication list explained in detail. Patient verbalized understanding.  Skin clean, dry and intact without evidence of skin break down, no evidence of skin tears noted. IV catheter discontinued intact. Site without signs and symptoms of complications. Dressing and pressure applied. Pt denies pain at the site currently. No complaints noted.  Patient free of lines, drains, and wounds.   An After Visit Summary (AVS) was printed and given to the patient. Patient escorted via wheelchair, and discharged home via private auto.  Anastasio Auerbach, RN

## 2022-07-02 NOTE — ED Notes (Signed)
Provider made aware of Lactic Acid of 3.4.

## 2022-07-02 NOTE — ED Notes (Signed)
Patient reports he is feeling better.  BP has responded well to the IV bolus.  Patient denies any shortness of breath.  Xray is now at bedside.

## 2022-07-02 NOTE — Plan of Care (Signed)
  Problem: Education: Goal: Knowledge of General Education information will improve Description: Including pain rating scale, medication(s)/side effects and non-pharmacologic comfort measures 07/02/2022 1530 by Anastasio Auerbach, RN Outcome: Completed/Met 07/02/2022 1529 by Anastasio Auerbach, RN Outcome: Progressing 07/02/2022 1529 by Anastasio Auerbach, RN Outcome: Progressing   Problem: Health Behavior/Discharge Planning: Goal: Ability to manage health-related needs will improve 07/02/2022 1530 by Anastasio Auerbach, RN Outcome: Completed/Met 07/02/2022 1529 by Anastasio Auerbach, RN Outcome: Progressing 07/02/2022 1529 by Anastasio Auerbach, RN Outcome: Progressing   Problem: Clinical Measurements: Goal: Ability to maintain clinical measurements within normal limits will improve 07/02/2022 1530 by Anastasio Auerbach, RN Outcome: Completed/Met 07/02/2022 1529 by Anastasio Auerbach, RN Outcome: Progressing 07/02/2022 1529 by Anastasio Auerbach, RN Outcome: Progressing Goal: Will remain free from infection 07/02/2022 1530 by Anastasio Auerbach, RN Outcome: Completed/Met 07/02/2022 1529 by Anastasio Auerbach, RN Outcome: Progressing 07/02/2022 1529 by Anastasio Auerbach, RN Outcome: Progressing Goal: Diagnostic test results will improve 07/02/2022 1530 by Anastasio Auerbach, RN Outcome: Completed/Met 07/02/2022 1529 by Anastasio Auerbach, RN Outcome: Progressing 07/02/2022 1529 by Anastasio Auerbach, RN Outcome: Progressing Goal: Respiratory complications will improve 07/02/2022 1530 by Anastasio Auerbach, RN Outcome: Completed/Met 07/02/2022 1529 by Anastasio Auerbach, RN Outcome: Progressing 07/02/2022 1529 by Anastasio Auerbach, RN Outcome: Progressing Goal: Cardiovascular complication will be avoided 07/02/2022 1530 by Anastasio Auerbach, RN Outcome: Completed/Met 07/02/2022 1529 by Anastasio Auerbach, RN Outcome: Progressing 07/02/2022 1529 by Anastasio Auerbach, RN Outcome: Progressing   Problem: Activity: Goal: Risk for activity intolerance will  decrease 07/02/2022 1530 by Anastasio Auerbach, RN Outcome: Completed/Met 07/02/2022 1529 by Anastasio Auerbach, RN Outcome: Progressing 07/02/2022 1529 by Anastasio Auerbach, RN Outcome: Progressing   Problem: Nutrition: Goal: Adequate nutrition will be maintained 07/02/2022 1530 by Anastasio Auerbach, RN Outcome: Completed/Met 07/02/2022 1529 by Anastasio Auerbach, RN Outcome: Progressing 07/02/2022 1529 by Anastasio Auerbach, RN Outcome: Progressing   Problem: Coping: Goal: Level of anxiety will decrease 07/02/2022 1530 by Anastasio Auerbach, RN Outcome: Completed/Met 07/02/2022 1529 by Anastasio Auerbach, RN Outcome: Progressing 07/02/2022 1529 by Anastasio Auerbach, RN Outcome: Progressing   Problem: Elimination: Goal: Will not experience complications related to bowel motility 07/02/2022 1530 by Anastasio Auerbach, RN Outcome: Completed/Met 07/02/2022 1529 by Anastasio Auerbach, RN Outcome: Progressing 07/02/2022 1529 by Anastasio Auerbach, RN Outcome: Progressing Goal: Will not experience complications related to urinary retention 07/02/2022 1530 by Anastasio Auerbach, RN Outcome: Completed/Met 07/02/2022 1529 by Anastasio Auerbach, RN Outcome: Progressing 07/02/2022 1529 by Anastasio Auerbach, RN Outcome: Progressing   Problem: Pain Managment: Goal: General experience of comfort will improve 07/02/2022 1530 by Anastasio Auerbach, RN Outcome: Completed/Met 07/02/2022 1529 by Anastasio Auerbach, RN Outcome: Progressing 07/02/2022 1529 by Anastasio Auerbach, RN Outcome: Progressing   Problem: Safety: Goal: Ability to remain free from injury will improve 07/02/2022 1530 by Anastasio Auerbach, RN Outcome: Completed/Met 07/02/2022 1529 by Anastasio Auerbach, RN Outcome: Progressing 07/02/2022 1529 by Anastasio Auerbach, RN Outcome: Progressing   Problem: Skin Integrity: Goal: Risk for impaired skin integrity will decrease 07/02/2022 1530 by Anastasio Auerbach, RN Outcome: Completed/Met 07/02/2022 1529 by Anastasio Auerbach, RN Outcome: Progressing 07/02/2022 1529  by Anastasio Auerbach, RN Outcome: Progressing

## 2022-07-02 NOTE — ED Provider Notes (Signed)
[name] Provider Note   CSN: [id number] Arrival date & time: 07/02/22  0907     History  Chief Complaint  Patient presents with   Weakness   Hypotension   Dizziness    [name] is a 51 y.o. male.  51 year old male with past medical history that is significant for hypertension, ESRD on HD on M [facility] schedule.  Last session yesterday.  Reports he got a full session.  He states around 7 PM he started developing lightheadedness.  Around 9 PM he noticed his systolic blood pressure was in the 70s.  He was able to fall asleep woke up this morning and went to work without checking his blood pressure.  He states while he was at work he became lightheaded, develop shortness of breath, and felt weak prompting him to come to the emergency room.  He was recently admitted and discharged on 9/28 for volume overload.  Last dose of Norvasc was yesterday morning.  Last dose of clonidine was last night.   The history is provided by the patient. No language interpreter was used.       Home Medications Prior to Admission medications   Medication Sig Start Date End Date Taking? Authorizing Provider  acetaminophen (TYLENOL) 500 MG tablet Take 1,000 mg by mouth daily as needed (for pain or headaches).     [provider]  amLODipine (NORVASC) 10 MG tablet Take 1 tablet (10 mg total) by mouth daily at 2 PM. 07/01/22   Ghimire, Henreitta Leber, MD  B Complex-C-Folic Acid (DIALYVITE TABLET) TABS Take 1 tablet by mouth daily. 06/29/20   [provider]  cloNIDine (CATAPRES) 0.1 MG tablet Take 1 tablet (0.1 mg total) by mouth at bedtime. 07/01/22   Ghimire, Henreitta Leber, MD  TUMS ULTRA 1000 1000 MG chewable tablet Chew 4,000 mg by mouth 3 (three) times daily with meals. 03/14/20   [provider]  fluticasone (FLONASE) 50 MCG/ACT nasal spray Place 2 sprays into both nostrils daily. Patient not taking: Reported on 06/15/2018 07/25/17 02/20/20   Larene Pickett, PA-C      Allergies    Ivp dye [iodinated contrast media], Lisinopril, and Solu-medrol [methylprednisolone]    Review of Systems   Review of Systems  Constitutional:  Negative for chills and fever.  Respiratory:  Positive for shortness of breath.   Cardiovascular:  Negative for chest pain.  Neurological:  Positive for weakness and light-headedness. Negative for syncope.  All other systems reviewed and are negative.   Physical Exam Updated Vital Signs Temp (!) 97.5 F (36.4 C) (Oral)  Physical Exam Vitals and nursing note reviewed.  Constitutional:      General: He is not in acute distress.    Appearance: Normal appearance. He is not ill-appearing.  HENT:     Head: Normocephalic and atraumatic.     Nose: Nose normal.  Eyes:     General: No scleral icterus.    Extraocular Movements: Extraocular movements intact.     Conjunctiva/sclera: Conjunctivae normal.  Cardiovascular:     Rate and Rhythm: Regular rhythm. Tachycardia present.     Pulses: Normal pulses.  Pulmonary:     Effort: Pulmonary effort is normal. No respiratory distress.     Breath sounds: Normal breath sounds. No wheezing or rales.  Abdominal:     General: There is no distension.     Tenderness: There is no abdominal tenderness.  Musculoskeletal:        General: Normal  range of motion.     Cervical back: Normal range of motion.     Right lower leg: No edema.     Left lower leg: No edema.  Skin:    General: Skin is warm and dry.  Neurological:     General: No focal deficit present.     Mental Status: He is alert. Mental status is at baseline.     ED Results / Procedures / Treatments   Labs (all labs ordered are listed, but only abnormal results are displayed) Labs Reviewed  RESP PANEL BY RT-PCR (FLU A&B, COVID) ARPGX2  CULTURE, BLOOD (ROUTINE X 2)  CULTURE, BLOOD (ROUTINE X 2)  URINE CULTURE  LACTIC ACID, PLASMA  LACTIC ACID, PLASMA  COMPREHENSIVE METABOLIC PANEL  CBC WITH  DIFFERENTIAL/PLATELET  PROTIME-INR  APTT  URINALYSIS, ROUTINE W REFLEX MICROSCOPIC  BRAIN NATRIURETIC PEPTIDE  I-STAT CHEM 8, ED  I-STAT VENOUS BLOOD GAS, ED  TROPONIN I (HIGH SENSITIVITY)    EKG None  Radiology No results found.  Procedures .Critical Care  Performed by: Evlyn Courier, PA-C Authorized by: Evlyn Courier, PA-C   Critical care provider statement:    Critical care time (minutes):  40   Critical care was necessary to treat or prevent imminent or life-threatening deterioration of the following conditions:  Shock   Critical care was time spent personally by me on the following activities:  Development of treatment plan with patient or surrogate, discussions with consultants, evaluation of patient's response to treatment, examination of patient, ordering and review of laboratory studies, ordering and review of radiographic studies, ordering and performing treatments and interventions, pulse oximetry, re-evaluation of patient's condition and review of old charts     Medications Ordered in ED Medications  lactated ringers bolus 250 mL (has no administration in time range)    ED Course/ Medical Decision Making/ A&P Clinical Course as of 07/02/22 1104  Sat Jul 02, 2022  1024 VBG with mild alkalosis otherwise without acute findings.  Chem-8 with creatinine 13.5, potassium of 4.1.  Otherwise without acute concerns.  CBC without leukocytosis or anemia.  On reevaluation patient with improved BP with 250 microvillus infusing.  We will continue to monitor.  Other labs, chest x-ray pending at this time. [AA]    Clinical Course User Index [AA] Evlyn Courier, PA-C                           Medical Decision Making Amount and/or Complexity of Data Reviewed Labs: ordered. Radiology: ordered. ECG/medicine tests: ordered.   Medical Decision Making / ED Course   This patient presents to the ED for concern of hypotension, lightheadedness, this involves an extensive number of treatment  options, and is a complaint that carries with it a high risk of complications and morbidity.  The differential diagnosis includes dehydration, ACS, shock  MDM: 51 year old male with past medical history significant for hypertension, ESRD on HD last session yesterday presents today for evaluation of lightheadedness, weakness, and hypotension.  He noticed since last night following his dialysis session around 7 PM that he was hypotensive.  Symptoms worsened while at work today prompting him to come to the emergency room.  On arrival to the emergency room systolic of 88 noted with MAP of 72.  He also endorses shortness of breath.  Sepsis labs ordered.  He is afebrile.  Will hold off on large volume resuscitation given history of HFrEF and the fact that he does not make urine  which would make volume overload difficult to correct.  Will obtain chest x-ray to ensure he is not in volume overload.  We will provide micro boluses of 250 for hypotension.  Will reevaluate after each microbolus and determine the need for additional fluids.   Lactic acidosis with lactic acid of 3.4.  Will provide additional microbolus of 250.  BMP shows creatinine of 12.18 potassium of 4.4, otherwise without acute concerns.  Patient also states that he is unable to feel thrill over his right groin fistula.  I am unable to appreciate a palpable thrill.  He states he recently had this declotting during admission on 9/27.  Will discuss with on-call interventional radiologist.   We will discuss with hospitalist for admission for hypotension and lactic acidosis.  Discussed with Dr. Lorin Mercy with the hospitalist team who will evaluate patient for admission.  Additional history obtained: -Additional history obtained from recent admission.  Patient underwent declotting of his fistula. -External records from outside source obtained and reviewed including: Chart review including previous notes, labs, imaging, consultation notes   Lab  Tests: -I ordered, reviewed, and interpreted labs.   The pertinent results include:   Labs Reviewed  RESP PANEL BY RT-PCR (FLU A&B, COVID) ARPGX2  CULTURE, BLOOD (ROUTINE X 2)  CULTURE, BLOOD (ROUTINE X 2)  URINE CULTURE  LACTIC ACID, PLASMA  LACTIC ACID, PLASMA  COMPREHENSIVE METABOLIC PANEL  CBC WITH DIFFERENTIAL/PLATELET  PROTIME-INR  APTT  URINALYSIS, ROUTINE W REFLEX MICROSCOPIC  BRAIN NATRIURETIC PEPTIDE  I-STAT CHEM 8, ED  I-STAT VENOUS BLOOD GAS, ED  TROPONIN I (HIGH SENSITIVITY)      EKG  EKG Interpretation  Date/Time:    Ventricular Rate:    PR Interval:    QRS Duration:   QT Interval:    QTC Calculation:   R Axis:     Text Interpretation:           Imaging Studies ordered: I ordered imaging studies including chest x-ray I independently visualized and interpreted imaging. I agree with the radiologist interpretation   Medicines ordered and prescription drug management: Meds ordered this encounter  Medications   DISCONTD: lactated ringers infusion   lactated ringers bolus 250 mL    -I have reviewed the patients home medicines and have made adjustments as needed  Critical interventions IV fluid bolus  Cardiac Monitoring: The patient was maintained on a cardiac monitor.  I personally viewed and interpreted the cardiac monitored which showed an underlying rhythm of: Normal sinus rhythm  Reevaluation: After the interventions noted above, I reevaluated the patient and found that they have :improved  Co morbidities that complicate the patient evaluation  Past Medical History:  Diagnosis Date   Anemia    Anxiety    Depression    ESRD on hemodialysis ([place])    HD [facility] MWF   Hypertension    Thyroid disease       Dispostion: Patient discussed with hospitalist will evaluate patient for admission.    Final Clinical Impression(s) / ED Diagnoses Final diagnoses:  Hypotension, unspecified hypotension type  Lactic acidosis    Rx  / DC Orders ED Discharge Orders     None         Evlyn Courier, PA-C 07/02/22 1147    Davonna Belling, MD 07/02/22 1523

## 2022-07-02 NOTE — ED Notes (Signed)
ED TO INPATIENT HANDOFF REPORT  ED Nurse Name and Phone #: [phone]   S Name/Age/Gender [name] 51 y.o. male Room/Bed: 001C/001C  Code Status   Code Status: Full Code  Home/SNF/Other  Patient oriented to: self, place, time, and situation Is this baseline? Yes   Triage Complete: Triage complete  Chief Complaint Hypotension of hemodialysis [I95.3]  Triage Note Patient arrives via [facility].  Patient reported to be at work and had dizziness and weakness.  Patient is a dialysis patient.  He had a full treatment on yesterday.  He reports he noticed he was weak when he returned home.  His pressure was low.  Today he was at work and called for assistance due to feeling weak, dizzy, and he has concerns that the dialysis catheter in the right upper leg is clotted.  No thrill noted on exam.  Patient is alert and oriented.  He is complaining of feeling cold.  He denies any recent fevers.  Bp with ems was 86/60.  CBG 132.  He has not had any daily  meds today   Allergies Allergies  Allergen Reactions   Ivp Dye [Iodinated Contrast Media] Swelling    SWELLING REACTION UNSPECIFIED    Lisinopril Cough   Solu-Medrol [Methylprednisolone] Nausea And Vomiting    Level of Care/Admitting Diagnosis ED Disposition     ED Disposition  Admit   Condition  --   Comment  Hospital Area: [place] [[id number]]  Level of Care: Telemetry Medical [104]  May place patient in observation at [name] or [place] if equivalent level of care is available:: No  Covid Evaluation: Asymptomatic - no recent exposure (last 10 days) testing not required  Diagnosis: Hypotension of hemodialysis [458.21.ICD-9-CM]  Admitting Physician: Karmen Bongo [[id number]]  Attending Physician: Karmen Bongo [[id number]]          B Medical/Surgery History Past Medical History:  Diagnosis Date   Anemia    Anxiety    Depression    ESRD on hemodialysis ([place])    HD [place] MWF    Hypertension    Thyroid disease    Past Surgical History:  Procedure Laterality Date   ARTERIOVENOUS GRAFT PLACEMENT Left    "forearm, it's not working; thigh"    ARTERIOVENOUS GRAFT PLACEMENT Left 11/09/2015   AV FISTULA PLACEMENT Right 01/10/2017   Procedure: INSERTION OF ARTERIOVENOUS Right thigh GORE-TEX GRAFT;  Surgeon: [name], MD;  Location: [place];  Service: Vascular;  Laterality: Right;   FALSE ANEURYSM REPAIR Left 11/09/2015   Procedure: REPAIR OF LEFT FEMORAL PSEUDOANEURYSM; REVISION  OF LEFT THIGH ARTERIOVENOUS GRAFT USING 6MM X 10 CM GORETEX GRAFT ;  Surgeon: [name], MD;  Location: [place];  Service: Vascular;  Laterality: Left;   IR AV DIALY SHUNT INTRO NEEDLE/INTRACATH INITIAL W/PTA/IMG RIGHT Right 02/06/2019   IR AV DIALY SHUNT INTRO NEEDLE/INTRACATH INITIAL W/PTA/IMG RIGHT Right 05/14/2019   IR AV DIALY SHUNT INTRO NEEDLE/INTRACATH INITIAL W/PTA/IMG RIGHT Right 08/18/2020   IR AV DIALY SHUNT INTRO NEEDLE/INTRACATH INITIAL W/PTA/IMG RIGHT Right 04/21/2022   IR DIALY SHUNT INTRO NEEDLE/INTRACATH INITIAL W/IMG RIGHT Right 09/17/2019   IR DIALY SHUNT INTRO NEEDLE/INTRACATH INITIAL W/IMG RIGHT Right 12/10/2019   IR FLUORO GUIDE CV LINE RIGHT  07/10/2021   IR FLUORO GUIDE CV LINE RIGHT  06/27/2022   IR GENERIC HISTORICAL  07/15/2016   IR US GUIDE VASC ACCESS LEFT 07/15/2016 [name], MD [facility]   IR GENERIC HISTORICAL Left 07/15/2016  IR THROMBECTOMY AV FISTULA W/THROMBOLYSIS/PTA INC/SHUNT/IMG LEFT 07/15/2016 [name], MD [facility]   IR GENERIC HISTORICAL  10/05/2016   IR US GUIDE VASC ACCESS LEFT 10/05/2016 [name], MD [facility]   IR GENERIC HISTORICAL Left 10/05/2016   IR THROMBECTOMY AV FISTULA W/THROMBOLYSIS/PTA INC/SHUNT/IMG LEFT 10/05/2016 [name], MD [facility]   IR GENERIC HISTORICAL  10/08/2016   IR FLUORO GUIDE CV LINE RIGHT 10/08/2016 [name], MD [facility]   IR GENERIC HISTORICAL  10/08/2016   IR US GUIDE VASC ACCESS RIGHT 10/08/2016  [name], MD [facility]   IR PTA AND STENT ADDL CENTRAL DIALY SEG THRU DIALY CIRCUIT RIGHT Right 12/10/2019   IR RADIOLOGIST EVAL & MGMT  11/26/2019   IR REMOVAL TUN CV CATH W/O FL  03/16/2017   IR REMOVAL TUN CV CATH W/O FL  07/22/2021   IR THROMBECTOMY AV FISTULA W/THROMBOLYSIS/PTA INC/SHUNT/IMG RIGHT Right 06/16/2018   IR THROMBECTOMY AV FISTULA W/THROMBOLYSIS/PTA INC/SHUNT/IMG RIGHT Right 06/17/2018   IR THROMBECTOMY AV FISTULA W/THROMBOLYSIS/PTA INC/SHUNT/IMG RIGHT Right 07/15/2021   IR THROMBECTOMY AV FISTULA W/THROMBOLYSIS/PTA INC/SHUNT/IMG RIGHT Right 06/29/2022   IR US GUIDE VASC ACCESS RIGHT  06/17/2018   IR US GUIDE VASC ACCESS RIGHT  06/16/2018   IR US GUIDE VASC ACCESS RIGHT  02/06/2019   IR US GUIDE VASC ACCESS RIGHT  05/14/2019   IR US GUIDE VASC ACCESS RIGHT  09/17/2019   IR US GUIDE VASC ACCESS RIGHT  12/10/2019   IR US GUIDE VASC ACCESS RIGHT  08/18/2020   IR US GUIDE VASC ACCESS RIGHT  07/10/2021   IR US GUIDE VASC ACCESS RIGHT  07/19/2021   IR US GUIDE VASC ACCESS RIGHT  04/21/2022   IR US GUIDE VASC ACCESS RIGHT  06/27/2022   IR US GUIDE VASC ACCESS RIGHT  06/29/2022   PARATHYROIDECTOMY N/A 04/24/2014   Procedure: TOTAL 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;  Surgeon: [name], MD;  Location: [place];  Service: General;  Laterality: N/A;  NECK AND LEFT FOREARM   PERIPHERAL VASCULAR CATHETERIZATION N/A 05/05/2016   Procedure: A/V Shuntogram;  Surgeon: Conrad [place], MD;  Location: [place] CV [facility];  Service: Cardiovascular;  Laterality: N/A;   PERIPHERAL VASCULAR CATHETERIZATION Left 05/05/2016   Procedure: Peripheral Vascular Balloon Angioplasty;  Surgeon: Conrad , MD;  Location: [place] [facility];  Service: Cardiovascular;  Laterality: Left;   PSEUDOANEURYSM REPAIR Left 11/09/2015   TEE WITHOUT CARDIOVERSION N/A 02/19/2021   Procedure: TRANSESOPHAGEAL ECHOCARDIOGRAM (TEE);  Surgeon: [name], MD;  Location: [facility] ENDOSCOPY;  Service: Cardiovascular;   Laterality: N/A;   THROMBECTOMY / ARTERIOVENOUS GRAFT REVISION Left 08/18/2015   thigh   THROMBECTOMY AND REVISION OF ARTERIOVENTOUS (AV) GORETEX  GRAFT Left 08/23/2015   Procedure: THROMBECTOMY AND REVISION OF ARTERIOVENTOUS (AV) GORETEX  GRAFT LEFT THIGH ;  Surgeon: [name], MD;  Location: [place];  Service: Vascular;  Laterality: Left;   THROMBECTOMY W/ EMBOLECTOMY Left 08/18/2015   Procedure: THROMBECTOMY  AND REVISION ARTERIOVENOUS GORE-TEX GRAFT/LEFT THIGH;  Surgeon: [name], MD;  Location: [place];  Service: Vascular;  Laterality: Left;   THROMBECTOMY W/ EMBOLECTOMY Right 06/19/2018   Procedure: THROMBECTOMY AND REVISION OF RIGHT THIGH  ARTERIOVENOUS GORE-TEX GRAFT;  Surgeon: [name], MD;  Location: [facility] OR;  Service: Vascular;  Laterality: Right;   UPPER EXTREMITY VENOGRAPHY Bilateral 12/27/2016   Procedure: Upper Extremity Venography;  Surgeon: [name], MD;  Location: [place] [facility];  Service: Cardiovascular;  Laterality: Bilateral;  VENOGRAM Left 05/05/2016   Procedure: Venogram;  Surgeon: Conrad New Weston, MD;  Location: [place] [facility];  Service: Cardiovascular;  Laterality: Left;  lower extremity     A IV Location/Drains/Wounds Patient Lines/Drains/Airways Status     Active Line/Drains/Airways     Name Placement date Placement time Site Days   Peripheral IV 07/02/22 22 G Anterior;Proximal;Right Forearm 07/02/22  0948  Forearm  less than 1   Fistula / Graft Left Thigh Arteriovenous vein graft --  --  Thigh  --   Fistula / Graft Right Thigh Arteriovenous vein graft 01/10/17  0837  Thigh  1999   Incision (Closed) 06/19/18 Thigh Right 06/19/18  1559  -- 1474   Incision (Closed) 12/10/19 Groin Right 12/10/19  1040  -- 935   Wound / Incision (Open or Dehisced) 07/15/21 Thigh Anterior;Right 07/15/21  1301  Thigh  352   Wound / Incision (Open or Dehisced) 07/15/21 Puncture Thigh Anterior;Right 07/15/21  1301  Thigh  352   Wound / Incision (Open  or Dehisced) 06/29/22 Puncture Thigh Anterior;Proximal;Right Right upper thigh declot x2 puncture 06/29/22  1101  Thigh  3            Intake/Output Last 24 hours  Intake/Output Summary (Last 24 hours) at 07/02/2022 1309 Last data filed at 07/02/2022 1107 Gross per 24 hour  Intake 250 ml  Output --  Net 250 ml    Labs/Imaging Results for orders placed or performed during the hospital encounter of 07/02/22 (from the past 48 hour(s))  Lactic acid, plasma     Status: Abnormal   Collection Time: 07/02/22  9:48 AM  Result Value Ref Range   Lactic Acid, Venous 3.4 (HH) 0.5 - 1.9 mmol/L    Comment: CRITICAL RESULT CALLED TO, READ BACK BY AND VERIFIED WITH C. JOHNSON RN 07/02/22 @1053  BY J. WHITE Performed at [facility] [address] [place], [place] [address]   Comprehensive metabolic panel     Status: Abnormal   Collection Time: 07/02/22  9:48 AM  Result Value Ref Range   Sodium 134 (L) 135 - 145 mmol/L   Potassium 4.4 3.5 - 5.1 mmol/L   Chloride 87 (L) 98 - 111 mmol/L   CO2 21 (L) 22 - 32 mmol/L   Glucose, Bld 85 70 - 99 mg/dL    Comment: Glucose reference range applies only to samples taken after fasting for at least 8 hours.   BUN 52 (H) 6 - 20 mg/dL   Creatinine, Ser 12.18 (H) 0.61 - 1.24 mg/dL   Calcium 9.4 8.9 - 10.3 mg/dL   Total Protein 9.7 (H) 6.5 - 8.1 g/dL   Albumin 4.5 3.5 - 5.0 g/dL   AST 24 15 - 41 U/L   ALT 18 0 - 44 U/L   Alkaline Phosphatase 68 38 - 126 U/L   Total Bilirubin 0.6 0.3 - 1.2 mg/dL   GFR, Estimated 5 (L) >60 mL/min    Comment: (NOTE) Calculated using the CKD-EPI Creatinine Equation (2021)    Anion gap 26 (H) 5 - 15    Comment: Electrolytes repeated to confirm. Performed at [facility], [place] [address], [place], [place] [address]   CBC with Differential     Status: Abnormal   Collection Time: 07/02/22  9:48 AM  Result Value Ref Range   WBC 6.9 4.0 - 10.5 K/uL   RBC 5.17 4.22 - 5.81 MIL/uL   Hemoglobin 16.2 13.0 - 17.0 g/dL    HCT  47.5 39.0 - 52.0 %   MCV 91.9 80.0 - 100.0 fL   MCH 31.3 26.0 - 34.0 pg   MCHC 34.1 30.0 - 36.0 g/dL   RDW 14.2 11.5 - 15.5 %   Platelets 234 150 - 400 K/uL   nRBC 0.0 0.0 - 0.2 %   Neutrophils Relative % 74 %   Neutro Abs 5.1 1.7 - 7.7 K/uL   Lymphocytes Relative 16 %   Lymphs Abs 1.1 0.7 - 4.0 K/uL   Monocytes Relative 8 %   Monocytes Absolute 0.5 0.1 - 1.0 K/uL   Eosinophils Relative 0 %   Eosinophils Absolute 0.0 0.0 - 0.5 K/uL   Basophils Relative 1 %   Basophils Absolute 0.1 0.0 - 0.1 K/uL   Immature Granulocytes 1 %   Abs Immature Granulocytes 0.09 (H) 0.00 - 0.07 K/uL    Comment: Performed at [facility] [address] [place], [place] [address]  Protime-INR     Status: None   Collection Time: 07/02/22  9:48 AM  Result Value Ref Range   Prothrombin Time 12.4 11.4 - 15.2 seconds   INR 0.9 0.8 - 1.2    Comment: (NOTE) INR goal varies based on device and disease states. Performed at [facility], [place] [address] [place], [place] [address]   APTT     Status: None   Collection Time: 07/02/22  9:48 AM  Result Value Ref Range   aPTT 31 24 - 36 seconds    Comment: Performed at [facility] [address], [place], [place] [address]  Blood Culture (routine x 2)     Status: None (Preliminary result)   Collection Time: 07/02/22  9:48 AM   Specimen: BLOOD  Result Value Ref Range   Specimen Description BLOOD SITE NOT SPECIFIED    Special Requests      BOTTLES DRAWN AEROBIC AND ANAEROBIC Blood Culture adequate volume   Culture      NO GROWTH <12 HOURS Performed at [facility], [place] [address], [place], [place] [address]    Report Status PENDING   Brain natriuretic peptide     Status: None   Collection Time: 07/02/22  9:48 AM  Result Value Ref Range   B Natriuretic Peptide 26.2 0.0 - 100.0 pg/mL    Comment: Performed at [facility] [address], [place], [place] [address]  Troponin I (High Sensitivity)     Status: Abnormal    Collection Time: 07/02/22  9:48 AM  Result Value Ref Range   Troponin I (High Sensitivity) 34 (H) <18 ng/L    Comment: (NOTE) Elevated high sensitivity troponin I (hsTnI) values and significant  changes across serial measurements may suggest ACS but many other  chronic and acute conditions are known to elevate hsTnI results.  Refer to the "Links" section for chest pain algorithms and additional  guidance. Performed at [facility], [place] [address] [place], [place] [address]   I-Stat venous blood gas, ED ([facility])     Status: Abnormal   Collection Time: 07/02/22  9:51 AM  Result Value Ref Range   pH, Ven 7.522 (H) 7.25 - 7.43   pCO2, Ven 31.2 (L) 44 - 60 mmHg   pO2, Ven 23 (LL) 32 - 45 mmHg   Bicarbonate 25.6 20.0 - 28.0 mmol/L   TCO2 27 22 - 32 mmol/L   O2 Saturation 47 %   Acid-Base Excess 4.0 (H) 0.0 - 2.0 mmol/L  Sodium 133 (L) 135 - 145 mmol/L   Potassium 4.1 3.5 - 5.1 mmol/L   Calcium, Ion 0.86 (LL) 1.15 - 1.40 mmol/L   HCT 51.0 39.0 - 52.0 %   Hemoglobin 17.3 (H) 13.0 - 17.0 g/dL   Sample type VENOUS    Comment NOTIFIED PHYSICIAN   I-Stat Chem 8, ED     Status: Abnormal   Collection Time: 07/02/22  9:52 AM  Result Value Ref Range   Sodium 133 (L) 135 - 145 mmol/L   Potassium 4.1 3.5 - 5.1 mmol/L   Chloride 96 (L) 98 - 111 mmol/L   BUN 50 (H) 6 - 20 mg/dL   Creatinine, Ser 13.50 (H) 0.61 - 1.24 mg/dL   Glucose, Bld 85 70 - 99 mg/dL    Comment: Glucose reference range applies only to samples taken after fasting for at least 8 hours.   Calcium, Ion 0.83 (LL) 1.15 - 1.40 mmol/L   TCO2 25 22 - 32 mmol/L   Hemoglobin 17.7 (H) 13.0 - 17.0 g/dL   HCT 52.0 39.0 - 52.0 %   Comment NOTIFIED PHYSICIAN   Resp Panel by RT-PCR (Flu A&B, Covid) Anterior Nasal Swab     Status: None   Collection Time: 07/02/22 10:25 AM   Specimen: Anterior Nasal Swab  Result Value Ref Range   SARS Coronavirus 2 by RT PCR NEGATIVE NEGATIVE    Comment: (NOTE) SARS-CoV-2 target nucleic acids  are NOT DETECTED.  The SARS-CoV-2 RNA is generally detectable in upper respiratory specimens during the acute phase of infection. The lowest concentration of SARS-CoV-2 viral copies this assay can detect is 138 copies/mL. A negative result does not preclude SARS-Cov-2 infection and should not be used as the sole basis for treatment or other patient management decisions. A negative result may occur with  improper specimen collection/handling, submission of specimen other than nasopharyngeal swab, presence of viral mutation(s) within the areas targeted by this assay, and inadequate number of viral copies(<138 copies/mL). A negative result must be combined with clinical observations, patient history, and epidemiological information. The expected result is Negative.  Fact Sheet for Patients:  EntrepreneurPulse.com.au  Fact Sheet for Healthcare Providers:  IncredibleEmployment.be  This test is no t yet approved or cleared by the [place] FDA and  has been authorized for detection and/or diagnosis of SARS-CoV-2 by FDA under an Emergency Use Authorization (EUA). This EUA will remain  in effect (meaning this test can be used) for the duration of the COVID-19 declaration under Section 564(b)(1) of the Act, 21 U.S.C.section 360bbb-3(b)(1), unless the authorization is terminated  or revoked sooner.       Influenza A by PCR NEGATIVE NEGATIVE   Influenza B by PCR NEGATIVE NEGATIVE    Comment: (NOTE) The Xpert Xpress SARS-CoV-2/FLU/RSV plus assay is intended as an aid in the diagnosis of influenza from Nasopharyngeal swab specimens and should not be used as a sole basis for treatment. Nasal washings and aspirates are unacceptable for Xpert Xpress SARS-CoV-2/FLU/RSV testing.  Fact Sheet for Patients: EntrepreneurPulse.com.au  Fact Sheet for Healthcare Providers: IncredibleEmployment.be  This test is not yet  approved or cleared by the [place] FDA and has been authorized for detection and/or diagnosis of SARS-CoV-2 by FDA under an Emergency Use Authorization (EUA). This EUA will remain in effect (meaning this test can be used) for the duration of the COVID-19 declaration under Section 564(b)(1) of the Act, 21 U.S.C. section 360bbb-3(b)(1), unless the authorization is terminated or revoked.  Performed at [facility], [place] [address] [place], [place] [address]   Troponin I (High Sensitivity)     Status: Abnormal   Collection Time: 07/02/22 11:57 AM  Result Value Ref Range   Troponin I (High Sensitivity) 33 (H) <18 ng/L    Comment: (NOTE) Elevated high sensitivity troponin I (hsTnI) values and significant  changes across serial measurements may suggest ACS but many other  chronic and acute conditions are known to elevate hsTnI results.  Refer to the "Links" section for chest pain algorithms and additional  guidance. Performed at [facility], [place] [address], [place], [place] [address]    DG Chest Port 1 View  Result Date: 07/02/2022 CLINICAL DATA:  Possible sepsis. EXAM: PORTABLE CHEST 1 VIEW COMPARISON:  Chest radiograph 06/27/2022 FINDINGS: Of note, the patient is mildly rotated on this portable examination. The heart size and mediastinal contours are within normal limits. No focal consolidation, pleural effusion, or pneumothorax. No acute osseous abnormality. 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 IMPRESSION: No acute cardiopulmonary abnormality. Electronically Signed   By: Ileana Roup M.D.   On: 07/02/2022 10:49    Pending Labs Unresulted Labs (From admission, onward)     Start     Ordered   07/03/22 8315  Basic metabolic panel  Tomorrow morning,   R        07/02/22 1233   07/03/22 0500  CBC  Tomorrow morning,   R        07/02/22 1233   07/02/22 0933  Lactic acid, plasma  (Septic presentation on arrival (screening labs, nursing and treatment  orders for obvious sepsis))  Now then every 2 hours,   R      07/02/22 0934   07/02/22 0933  Blood Culture (routine x 2)  (Septic presentation on arrival (screening labs, nursing and treatment orders for obvious sepsis))  BLOOD CULTURE X 2,   STAT      07/02/22 0934            Vitals/Pain Today's Vitals   07/02/22 1107 07/02/22 1115 07/02/22 1126 07/02/22 1130  BP:  103/77  104/77  Pulse:    82  Resp:  (!) 21  18  Temp:    98.2 F (36.8 C)  TempSrc:    Oral  SpO2:    100%  Weight:      Height:      PainSc: 0-No pain  0-No pain     Isolation Precautions No active isolations  Medications Medications  multivitamin (RENA-VIT) tablet 1 tablet (has no administration in time range)  calcium carbonate (TUMS - dosed in mg elemental calcium) chewable tablet 4,000 mg (has no administration in time range)  acetaminophen (TYLENOL) tablet 650 mg (has no administration in time range)    Or  acetaminophen (TYLENOL) suppository 650 mg (has no administration in time range)  zolpidem (AMBIEN) tablet 5 mg (has no administration in time range)  sorbitol 70 % solution 30 mL (has no administration in time range)  docusate sodium (ENEMEEZ) enema 283 mg (has no administration in time range)  ondansetron (ZOFRAN) tablet 4 mg (has no administration in time range)    Or  ondansetron (ZOFRAN) injection 4 mg (has no administration in time range)  camphor-menthol (SARNA) lotion 1 Application (has no administration in time range)    And  hydrOXYzine (ATARAX) tablet 25 mg (has no administration in time range)  calcium carbonate (TUMS - dosed in mg elemental calcium) chewable tablet 500 mg of elemental calcium (  has no administration in time range)  feeding supplement (NEPRO CARB STEADY) liquid 237 mL (has no administration in time range)  heparin injection 5,000 Units (has no administration in time range)  sodium chloride flush (NS) 0.9 % injection 3 mL (has no administration in time range)   hydrALAZINE (APRESOLINE) injection 5 mg (has no administration in time range)  lactated ringers bolus 250 mL (0 mLs Intravenous Stopped 07/02/22 1042)  lactated ringers bolus 250 mL (0 mLs Intravenous Stopped 07/02/22 1200)    Mobility Have not had pt up to see if can walk. Pt states he can walk Low fall risk   Focused Assessments Renal Assessment Handoff:  Hemodialysis Schedule:  Last Hemodialysis date and time: 07/01/22    Restricted appendage: left arm   R Recommendations: See Admitting Provider Note  Report given to:   Additional Notes:

## 2022-07-02 NOTE — Plan of Care (Signed)
  Problem: Education: Goal: Knowledge of General Education information will improve Description: Including pain rating scale, medication(s)/side effects and non-pharmacologic comfort measures 07/02/2022 1529 by Anastasio Auerbach, RN Outcome: Progressing 07/02/2022 1529 by Anastasio Auerbach, RN Outcome: Progressing   Problem: Health Behavior/Discharge Planning: Goal: Ability to manage health-related needs will improve 07/02/2022 1529 by Anastasio Auerbach, RN Outcome: Progressing 07/02/2022 1529 by Anastasio Auerbach, RN Outcome: Progressing   Problem: Clinical Measurements: Goal: Ability to maintain clinical measurements within normal limits will improve 07/02/2022 1529 by Anastasio Auerbach, RN Outcome: Progressing 07/02/2022 1529 by Anastasio Auerbach, RN Outcome: Progressing Goal: Will remain free from infection 07/02/2022 1529 by Anastasio Auerbach, RN Outcome: Progressing 07/02/2022 1529 by Anastasio Auerbach, RN Outcome: Progressing Goal: Diagnostic test results will improve 07/02/2022 1529 by Anastasio Auerbach, RN Outcome: Progressing 07/02/2022 1529 by Anastasio Auerbach, RN Outcome: Progressing Goal: Respiratory complications will improve 07/02/2022 1529 by Anastasio Auerbach, RN Outcome: Progressing 07/02/2022 1529 by Anastasio Auerbach, RN Outcome: Progressing Goal: Cardiovascular complication will be avoided 07/02/2022 1529 by Anastasio Auerbach, RN Outcome: Progressing 07/02/2022 1529 by Anastasio Auerbach, RN Outcome: Progressing   Problem: Activity: Goal: Risk for activity intolerance will decrease 07/02/2022 1529 by Anastasio Auerbach, RN Outcome: Progressing 07/02/2022 1529 by Anastasio Auerbach, RN Outcome: Progressing   Problem: Nutrition: Goal: Adequate nutrition will be maintained 07/02/2022 1529 by Anastasio Auerbach, RN Outcome: Progressing 07/02/2022 1529 by Anastasio Auerbach, RN Outcome: Progressing   Problem: Coping: Goal: Level of anxiety will decrease 07/02/2022 1529 by Anastasio Auerbach, RN Outcome:  Progressing 07/02/2022 1529 by Anastasio Auerbach, RN Outcome: Progressing   Problem: Elimination: Goal: Will not experience complications related to bowel motility 07/02/2022 1529 by Anastasio Auerbach, RN Outcome: Progressing 07/02/2022 1529 by Anastasio Auerbach, RN Outcome: Progressing Goal: Will not experience complications related to urinary retention 07/02/2022 1529 by Anastasio Auerbach, RN Outcome: Progressing 07/02/2022 1529 by Anastasio Auerbach, RN Outcome: Progressing   Problem: Pain Managment: Goal: General experience of comfort will improve 07/02/2022 1529 by Anastasio Auerbach, RN Outcome: Progressing 07/02/2022 1529 by Anastasio Auerbach, RN Outcome: Progressing   Problem: Safety: Goal: Ability to remain free from injury will improve 07/02/2022 1529 by Anastasio Auerbach, RN Outcome: Progressing 07/02/2022 1529 by Anastasio Auerbach, RN Outcome: Progressing   Problem: Skin Integrity: Goal: Risk for impaired skin integrity will decrease 07/02/2022 1529 by Anastasio Auerbach, RN Outcome: Progressing 07/02/2022 1529 by Anastasio Auerbach, RN Outcome: Progressing

## 2022-07-02 NOTE — Progress Notes (Signed)
  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   IR has already been consulted for declot of his graft. Last declot was 9/27 (3 days ago). Defer to IR for plan.  Reviewing his outpatient HD records, BP has been reasonably stable during and after HD - usually 110-120/60 range. That being said, would certainly agree with holding his amlodipine and clonidine for now.   He underwent full outpatient dialysis yesterday - next HD will be due on Monday - 07/04/22.  If he remains admitted on Monday when dialysis is due, then we will complete a full consult at that time. However, our team is available for any questions if needed in the mean time.   Veneta Penton, PA-C [employer] Pager [phone]

## 2022-07-02 NOTE — Progress Notes (Signed)
Informed by Dr. Earleen Newport that this patient needs declot of right thigh AVG.   No order for the declot in chart,  placed order for declot.  This patient needs 13 hr prep for contrast allergy, he is tentatively scheduled for declot on Monday 12 pm, 13 hr prep ordered, made NPO Mon [place].   If patient d/c over the weekend, [facility] IR/schedulers must be contacted for this patient to be scheduled as outpatient.   Please call IR radiologist on call for question/concern, IR team are on call during weekend.  Armando Gang Cliffard Hair PA-C 07/02/2022 1:53 PM

## 2022-07-03 ENCOUNTER — Telehealth: Payer: Self-pay | Admitting: Student

## 2022-07-03 ENCOUNTER — Other Ambulatory Visit: Payer: Self-pay | Admitting: Student

## 2022-07-03 DIAGNOSIS — T82590A Other mechanical complication of surgically created arteriovenous fistula, initial encounter: Secondary | ICD-10-CM

## 2022-07-03 NOTE — Telephone Encounter (Signed)
Patient is scheduled for AVG declot at [facility] IR on Monday 12 pm.   Called the patient to discuss instructions (NPO at [place], be at [facility] admitting by 10am, 13 hr allergy prep,) but he does not answer and his voice message box is full.   Called sister but the number is not in service anymore.   [facility] IR supervisor and charge RN have been notified that this patient is scheduled for declot tomorrow.    Woodson PA-C 07/03/2022 8:58 AM

## 2022-07-04 ENCOUNTER — Other Ambulatory Visit: Payer: Self-pay

## 2022-07-04 ENCOUNTER — Other Ambulatory Visit (HOSPITAL_COMMUNITY): Payer: Self-pay | Admitting: Nephrology

## 2022-07-04 ENCOUNTER — Encounter (HOSPITAL_COMMUNITY): Payer: Self-pay

## 2022-07-04 ENCOUNTER — Telehealth (HOSPITAL_COMMUNITY): Payer: Self-pay | Admitting: Emergency Medicine

## 2022-07-04 ENCOUNTER — Ambulatory Visit (HOSPITAL_COMMUNITY)
Admission: RE | Admit: 2022-07-04 | Discharge: 2022-07-04 | Disposition: A | Discharge status: Home / Self Care | Payer: Self-pay | Source: Ambulatory Visit | Attending: Nephrology | Admitting: Nephrology

## 2022-07-04 ENCOUNTER — Inpatient Hospital Stay (HOSPITAL_COMMUNITY)
Admission: EM | Admit: 2022-07-04 | Discharge: 2022-07-06 | DRG: 270 | Disposition: A | Discharge status: Home / Self Care | Payer: Self-pay | Attending: Internal Medicine | Admitting: Internal Medicine

## 2022-07-04 DIAGNOSIS — T829XXA Unspecified complication of cardiac and vascular prosthetic device, implant and graft, initial encounter: Secondary | ICD-10-CM | POA: Diagnosis present

## 2022-07-04 DIAGNOSIS — N186 End stage renal disease: Secondary | ICD-10-CM | POA: Diagnosis present

## 2022-07-04 DIAGNOSIS — I10 Essential (primary) hypertension: Secondary | ICD-10-CM | POA: Diagnosis present

## 2022-07-04 DIAGNOSIS — Z992 Dependence on renal dialysis: Secondary | ICD-10-CM

## 2022-07-04 DIAGNOSIS — B1910 Unspecified viral hepatitis B without hepatic coma: Secondary | ICD-10-CM | POA: Diagnosis present

## 2022-07-04 DIAGNOSIS — T8249XA Other complication of vascular dialysis catheter, initial encounter: Principal | ICD-10-CM

## 2022-07-04 DIAGNOSIS — I132 Hypertensive heart and chronic kidney disease with heart failure and with stage 5 chronic kidney disease, or end stage renal disease: Secondary | ICD-10-CM | POA: Diagnosis present

## 2022-07-04 DIAGNOSIS — N2581 Secondary hyperparathyroidism of renal origin: Secondary | ICD-10-CM | POA: Diagnosis present

## 2022-07-04 DIAGNOSIS — Z79899 Other long term (current) drug therapy: Secondary | ICD-10-CM

## 2022-07-04 DIAGNOSIS — I5022 Chronic systolic (congestive) heart failure: Secondary | ICD-10-CM | POA: Diagnosis present

## 2022-07-04 DIAGNOSIS — E872 Acidosis, unspecified: Secondary | ICD-10-CM

## 2022-07-04 DIAGNOSIS — Z91158 Patient's noncompliance with renal dialysis for other reason: Secondary | ICD-10-CM

## 2022-07-04 DIAGNOSIS — F32A Depression, unspecified: Secondary | ICD-10-CM | POA: Diagnosis present

## 2022-07-04 DIAGNOSIS — R072 Precordial pain: Secondary | ICD-10-CM

## 2022-07-04 DIAGNOSIS — Y832 Surgical operation with anastomosis, bypass or graft as the cause of abnormal reaction of the patient, or of later complication, without mention of misadventure at the time of the procedure: Secondary | ICD-10-CM | POA: Diagnosis present

## 2022-07-04 DIAGNOSIS — D631 Anemia in chronic kidney disease: Secondary | ICD-10-CM | POA: Diagnosis present

## 2022-07-04 DIAGNOSIS — T82818A Embolism of vascular prosthetic devices, implants and grafts, initial encounter: Principal | ICD-10-CM | POA: Diagnosis present

## 2022-07-04 DIAGNOSIS — Z91041 Radiographic dye allergy status: Secondary | ICD-10-CM

## 2022-07-04 DIAGNOSIS — E875 Hyperkalemia: Secondary | ICD-10-CM | POA: Diagnosis present

## 2022-07-04 DIAGNOSIS — T82898A Other specified complication of vascular prosthetic devices, implants and grafts, initial encounter: Secondary | ICD-10-CM | POA: Diagnosis present

## 2022-07-04 DIAGNOSIS — R9431 Abnormal electrocardiogram [ECG] [EKG]: Secondary | ICD-10-CM

## 2022-07-04 DIAGNOSIS — Z888 Allergy status to other drugs, medicaments and biological substances status: Secondary | ICD-10-CM

## 2022-07-04 DIAGNOSIS — F419 Anxiety disorder, unspecified: Secondary | ICD-10-CM | POA: Diagnosis present

## 2022-07-04 DIAGNOSIS — Z87891 Personal history of nicotine dependence: Secondary | ICD-10-CM

## 2022-07-04 LAB — HEPATITIS C ANTIBODY: HCV Ab: NONREACTIVE

## 2022-07-04 LAB — MAGNESIUM: Magnesium: 2.7 mg/dL — ABNORMAL HIGH (ref 1.7–2.4)

## 2022-07-04 LAB — CBC WITH DIFFERENTIAL/PLATELET
Abs Immature Granulocytes: 0.02 10*3/uL (ref 0.00–0.07)
Basophils Absolute: 0 10*3/uL (ref 0.0–0.1)
Basophils Relative: 0 %
Eosinophils Absolute: 0 10*3/uL (ref 0.0–0.5)
Eosinophils Relative: 0 %
HCT: 40.8 % (ref 39.0–52.0)
Hemoglobin: 13 g/dL (ref 13.0–17.0)
Immature Granulocytes: 0 %
Lymphocytes Relative: 7 %
Lymphs Abs: 0.5 10*3/uL — ABNORMAL LOW (ref 0.7–4.0)
MCH: 31 pg (ref 26.0–34.0)
MCHC: 31.9 g/dL (ref 30.0–36.0)
MCV: 97.1 fL (ref 80.0–100.0)
Monocytes Absolute: 0.1 10*3/uL (ref 0.1–1.0)
Monocytes Relative: 2 %
Neutro Abs: 6.7 10*3/uL (ref 1.7–7.7)
Neutrophils Relative %: 91 %
Platelets: 288 10*3/uL (ref 150–400)
RBC: 4.2 MIL/uL — ABNORMAL LOW (ref 4.22–5.81)
RDW: 14.8 % (ref 11.5–15.5)
WBC: 7.4 10*3/uL (ref 4.0–10.5)
nRBC: 0 % (ref 0.0–0.2)

## 2022-07-04 LAB — BASIC METABOLIC PANEL
Anion gap: 30 — ABNORMAL HIGH (ref 5–15)
BUN: 119 mg/dL — ABNORMAL HIGH (ref 6–20)
CO2: 19 mmol/L — ABNORMAL LOW (ref 22–32)
Calcium: 8 mg/dL — ABNORMAL LOW (ref 8.9–10.3)
Chloride: 83 mmol/L — ABNORMAL LOW (ref 98–111)
Creatinine, Ser: 20.5 mg/dL — ABNORMAL HIGH (ref 0.61–1.24)
GFR, Estimated: 2 mL/min — ABNORMAL LOW (ref >60)
Glucose, Bld: 181 mg/dL — ABNORMAL HIGH (ref 70–99)
Potassium: 5.3 mmol/L — ABNORMAL HIGH (ref 3.5–5.1)
Sodium: 132 mmol/L — ABNORMAL LOW (ref 135–145)

## 2022-07-04 LAB — COMPREHENSIVE METABOLIC PANEL
ALT: 14 U/L (ref 0–44)
AST: 17 U/L (ref 15–41)
Albumin: 4 g/dL (ref 3.5–5.0)
Alkaline Phosphatase: 64 U/L (ref 38–126)
Anion gap: 29 — ABNORMAL HIGH (ref 5–15)
BUN: 108 mg/dL — ABNORMAL HIGH (ref 6–20)
CO2: 14 mmol/L — ABNORMAL LOW (ref 22–32)
Calcium: 7.7 mg/dL — ABNORMAL LOW (ref 8.9–10.3)
Chloride: 89 mmol/L — ABNORMAL LOW (ref 98–111)
Creatinine, Ser: 20 mg/dL — ABNORMAL HIGH (ref 0.61–1.24)
GFR, Estimated: 3 mL/min — ABNORMAL LOW (ref >60)
Glucose, Bld: 99 mg/dL (ref 70–99)
Potassium: 7.2 mmol/L (ref 3.5–5.1)
Sodium: 132 mmol/L — ABNORMAL LOW (ref 135–145)
Total Bilirubin: 0.8 mg/dL (ref 0.3–1.2)
Total Protein: 8.1 g/dL (ref 6.5–8.1)

## 2022-07-04 LAB — HEPATITIS B CORE ANTIBODY, TOTAL: Hep B Core Total Ab: REACTIVE — AB

## 2022-07-04 LAB — HEPATITIS B SURFACE ANTIBODY,QUALITATIVE: Hep B S Ab: NONREACTIVE

## 2022-07-04 LAB — CBG MONITORING, ED: Glucose-Capillary: 194 mg/dL — ABNORMAL HIGH (ref 70–99)

## 2022-07-04 MED ORDER — DEXTROSE 50 % IV SOLN
1.0000 | Freq: Once | INTRAVENOUS | Status: AC
  Administered 2022-07-04: 50 mL via INTRAVENOUS
  Filled 2022-07-04: qty 50

## 2022-07-04 MED ORDER — SODIUM CHLORIDE 0.9% FLUSH: 3.0000 mL | INTRAVENOUS | Status: DC | PRN

## 2022-07-04 MED ORDER — PREDNISONE 20 MG PO TABS
50.0000 mg | ORAL_TABLET | Freq: Once | ORAL | Status: AC
  Filled 2022-07-04: qty 1

## 2022-07-04 MED ORDER — METOPROLOL TARTRATE 25 MG PO TABS: 100.0000 mg | ORAL_TABLET | Freq: Once | ORAL | Status: DC

## 2022-07-04 MED ORDER — METOPROLOL TARTRATE 100 MG PO TABS
100.0000 mg | ORAL_TABLET | Freq: Once | ORAL | Status: AC
  Administered 2022-07-05: 100 mg via ORAL
  Filled 2022-07-04: qty 1

## 2022-07-04 MED ORDER — FOLIC ACID 1 MG PO TABS
1.0000 mg | ORAL_TABLET | Freq: Every day | ORAL | Status: DC
  Administered 2022-07-04 – 2022-07-05 (×2): 1 mg via ORAL
  Filled 2022-07-04 (×2): qty 1

## 2022-07-04 MED ORDER — ALBUTEROL SULFATE (2.5 MG/3ML) 0.083% IN NEBU
10.0000 mg | INHALATION_SOLUTION | Freq: Once | RESPIRATORY_TRACT | Status: AC
  Administered 2022-07-04: 10 mg via RESPIRATORY_TRACT
  Filled 2022-07-04: qty 12

## 2022-07-04 MED ORDER — ALTEPLASE 2 MG IJ SOLR: 2.0000 mg | Freq: Once | INTRAMUSCULAR | Status: DC | PRN

## 2022-07-04 MED ORDER — CALCIUM CARBONATE ANTACID 1000 MG PO CHEW: 4000.0000 mg | CHEWABLE_TABLET | Freq: Three times a day (TID) | ORAL | Status: DC

## 2022-07-04 MED ORDER — CALCIUM ACETATE (PHOS BINDER) 667 MG PO CAPS: 1334.0000 mg | ORAL_CAPSULE | Freq: Three times a day (TID) | ORAL | Status: DC

## 2022-07-04 MED ORDER — SODIUM ZIRCONIUM CYCLOSILICATE 10 G PO PACK
10.0000 g | PACK | Freq: Two times a day (BID) | ORAL | Status: AC
  Administered 2022-07-04: 10 g via ORAL
  Filled 2022-07-04: qty 1

## 2022-07-04 MED ORDER — CHLORHEXIDINE GLUCONATE CLOTH 2 % EX PADS: 6.0000 | MEDICATED_PAD | Freq: Every day | CUTANEOUS | Status: DC

## 2022-07-04 MED ORDER — DIPHENHYDRAMINE HCL 25 MG PO CAPS
50.0000 mg | ORAL_CAPSULE | Freq: Once | ORAL | Status: AC
  Administered 2022-07-05: 50 mg via ORAL
  Filled 2022-07-04: qty 2

## 2022-07-04 MED ORDER — ACETAMINOPHEN 325 MG PO TABS: 650.0000 mg | ORAL_TABLET | Freq: Four times a day (QID) | ORAL | Status: DC | PRN

## 2022-07-04 MED ORDER — SODIUM CHLORIDE 0.9 % IV SOLN: INTRAVENOUS | Status: DC

## 2022-07-04 MED ORDER — HEPARIN SODIUM (PORCINE) 1000 UNIT/ML DIALYSIS: 1500.0000 [IU] | INTRAMUSCULAR | Status: DC | PRN

## 2022-07-04 MED ORDER — INSULIN ASPART 100 UNIT/ML IV SOLN
5.0000 [IU] | Freq: Once | INTRAVENOUS | Status: AC
  Administered 2022-07-04: 5 [IU] via INTRAVENOUS

## 2022-07-04 MED ORDER — CALCIUM GLUCONATE 10 % IV SOLN
1.0000 g | Freq: Once | INTRAVENOUS | Status: AC
  Administered 2022-07-04: 1 g via INTRAVENOUS
  Filled 2022-07-04: qty 10

## 2022-07-04 MED ORDER — LIDOCAINE-PRILOCAINE 2.5-2.5 % EX CREA: 1.0000 | TOPICAL_CREAM | CUTANEOUS | Status: DC | PRN

## 2022-07-04 MED ORDER — CHLORHEXIDINE GLUCONATE CLOTH 2 % EX PADS
6.0000 | MEDICATED_PAD | Freq: Every day | CUTANEOUS | Status: DC
  Administered 2022-07-05: 6 via TOPICAL

## 2022-07-04 MED ORDER — DEXTROSE 50 % IV SOLN: 25.0000 g | Freq: Once | INTRAVENOUS | Status: DC

## 2022-07-04 MED ORDER — ADULT MULTIVITAMIN W/MINERALS CH
1.0000 | ORAL_TABLET | Freq: Every day | ORAL | Status: DC
  Filled 2022-07-04: qty 1

## 2022-07-04 MED ORDER — PREDNISONE 20 MG PO TABS: 50.0000 mg | ORAL_TABLET | Freq: Once | ORAL | Status: AC

## 2022-07-04 MED ORDER — LIDOCAINE HCL (PF) 1 % IJ SOLN: 5.0000 mL | INTRAMUSCULAR | Status: DC | PRN

## 2022-07-04 MED ORDER — SODIUM BICARBONATE 8.4 % IV SOLN: 50.0000 meq | Freq: Once | INTRAVENOUS | Status: DC

## 2022-07-04 MED ORDER — PENTAFLUOROPROP-TETRAFLUOROETH EX AERO: 1.0000 | INHALATION_SPRAY | CUTANEOUS | Status: DC | PRN

## 2022-07-04 MED ORDER — HEPARIN SODIUM (PORCINE) 5000 UNIT/ML IJ SOLN
5000.0000 [IU] | Freq: Three times a day (TID) | INTRAMUSCULAR | Status: DC
  Administered 2022-07-04 – 2022-07-06 (×4): 5000 [IU] via SUBCUTANEOUS
  Filled 2022-07-04 (×4): qty 1

## 2022-07-04 MED ORDER — INSULIN ASPART 100 UNIT/ML IJ SOLN: 8.0000 [IU] | Freq: Once | INTRAMUSCULAR | Status: DC

## 2022-07-04 MED ORDER — DIPHENHYDRAMINE HCL 50 MG/ML IJ SOLN: 50.0000 mg | Freq: Once | INTRAMUSCULAR | Status: AC

## 2022-07-04 MED ORDER — SODIUM CHLORIDE 0.9 % IV SOLN: 250.0000 mL | INTRAVENOUS | Status: DC | PRN

## 2022-07-04 MED ORDER — SODIUM BICARBONATE 8.4 % IV SOLN
50.0000 meq | Freq: Once | INTRAVENOUS | Status: AC
  Administered 2022-07-04: 50 meq via INTRAVENOUS
  Filled 2022-07-04: qty 50

## 2022-07-04 MED ORDER — SODIUM CHLORIDE 0.9% FLUSH
3.0000 mL | Freq: Two times a day (BID) | INTRAVENOUS | Status: DC
  Administered 2022-07-04 – 2022-07-05 (×3): 3 mL via INTRAVENOUS

## 2022-07-04 MED ORDER — B COMPLEX-C PO TABS
1.0000 | ORAL_TABLET | Freq: Every day | ORAL | Status: DC
  Administered 2022-07-04 – 2022-07-05 (×2): 1 via ORAL
  Filled 2022-07-04 (×3): qty 1

## 2022-07-04 MED ORDER — PREDNISONE 20 MG PO TABS
50.0000 mg | ORAL_TABLET | Freq: Four times a day (QID) | ORAL | Status: AC
  Administered 2022-07-04 – 2022-07-05 (×3): 50 mg via ORAL
  Filled 2022-07-04 (×2): qty 1

## 2022-07-04 MED ORDER — ACETAMINOPHEN 650 MG RE SUPP: 650.0000 mg | Freq: Four times a day (QID) | RECTAL | Status: DC | PRN

## 2022-07-04 MED ORDER — SODIUM ZIRCONIUM CYCLOSILICATE 10 G PO PACK
10.0000 g | PACK | ORAL | Status: AC
  Administered 2022-07-04: 10 g via ORAL
  Filled 2022-07-04: qty 1

## 2022-07-04 MED ORDER — HEPARIN SODIUM (PORCINE) 1000 UNIT/ML DIALYSIS: 1000.0000 [IU] | INTRAMUSCULAR | Status: DC | PRN

## 2022-07-04 NOTE — Assessment & Plan Note (Signed)
Post HD hypotension, all hypertensive stopped recently Continue to monitor

## 2022-07-04 NOTE — Assessment & Plan Note (Signed)
51 year old presenting with hyperkalemia due to missed dialysis secondary to clotted graft. 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 De-clotting procedure done successfully on 06/28/22 but only able to complete 1 dialysis session before graft had no thrill and no access and clotted again.  -IR/nephrology consulted, needs dialysis but no access -specialists discussed will pre-medicate and observe overnight with plans to proceed with de-clotting procedure tomorrow vs. Tunneled HD catheter

## 2022-07-04 NOTE — ED Provider Notes (Signed)
[facility] EMERGENCY DEPARTMENT Provider Note   CSN: [id number] Arrival date & time: 07/04/22  1309     History  Chief Complaint  Patient presents with   Vascular Access Problem    Revere Muad Noga is a 51 y.o. male.  Pt with hx esrd/hd mwf, recent missed dialysis with last dialysis 3 days ago, with general weakness and high k. Pt was supposed to go to have de-clot procedure today as right thigh HD graft not working, but now has been rescheduled for tomorrow. K is 7.2. no chest pain or sob. No abd pain or nvd. No fever or chills.   The history is provided by the patient and medical records.       Home Medications Prior to Admission medications   Medication Sig Start Date End Date Taking? Authorizing Provider  acetaminophen (TYLENOL) 500 MG tablet Take 1,000 mg by mouth daily as needed (for pain or headaches).     [provider]  B Complex-C-Folic Acid (DIALYVITE TABLET) TABS Take 1 tablet by mouth daily. 06/29/20   [provider]  predniSONE (DELTASONE) 50 MG tablet One tablet every 6 hours starting Sunday (10/1) at 2300 (11pm) for a total of 3 doses 07/03/22   Karmen Bongo, MD  TUMS ULTRA 1000 1000 MG chewable tablet Chew 4,000 mg by mouth 3 (three) times daily with meals. 03/14/20   [provider]  fluticasone (FLONASE) 50 MCG/ACT nasal spray Place 2 sprays into both nostrils daily. Patient not taking: Reported on 06/15/2018 07/25/17 02/20/20  Larene Pickett, PA-C      Allergies    Ivp dye [iodinated contrast media], Lisinopril, and Solu-medrol [methylprednisolone]    Review of Systems   Review of Systems  Constitutional:  Negative for fever.  HENT:  Negative for sore throat.   Eyes:  Negative for redness.  Respiratory:  Negative for shortness of breath.   Cardiovascular:  Negative for chest pain.  Gastrointestinal:  Negative for abdominal pain and vomiting.  Genitourinary:  Negative for flank pain.   Musculoskeletal:  Negative for back pain and neck pain.  Skin:  Negative for rash.  Neurological:  Positive for weakness. Negative for headaches.  Hematological:  Does not bruise/bleed easily.    Physical Exam Updated Vital Signs BP (!) 142/93   Pulse 72   Temp 98.2 F (36.8 C) (Oral)   Resp 15   Ht 1.829 m (6')   Wt 65.8 kg   SpO2 100%   BMI 19.67 kg/m  Physical Exam Vitals and nursing note reviewed.  Constitutional:      Appearance: Normal appearance. He is well-developed.  HENT:     Head: Atraumatic.     Nose: Nose normal.     Mouth/Throat:     Mouth: Mucous membranes are moist.     Pharynx: Oropharynx is clear.  Eyes:     General: No scleral icterus.    Conjunctiva/sclera: Conjunctivae normal.  Neck:     Trachea: No tracheal deviation.  Cardiovascular:     Rate and Rhythm: Normal rate and regular rhythm.     Pulses: Normal pulses.     Heart sounds: Normal heart sounds. No murmur heard.    No friction rub. No gallop.  Pulmonary:     Effort: Pulmonary effort is normal. No accessory muscle usage or respiratory distress.     Breath sounds: Normal breath sounds.  Abdominal:     General: Bowel sounds are normal. There is no distension.  Palpations: Abdomen is soft.     Tenderness: There is no abdominal tenderness.  Genitourinary:    Comments: No cva tenderness. Musculoskeletal:        General: No swelling.     Cervical back: Normal range of motion and neck supple. No rigidity.     Comments: R thigh hd graft with no thrill. Leg is of normal color and warmth. Distal pulse palp.   Skin:    General: Skin is warm and dry.     Findings: No rash.  Neurological:     Mental Status: He is alert.     Comments: Alert, speech clear.   Psychiatric:        Mood and Affect: Mood normal.     ED Results / Procedures / Treatments   Labs (all labs ordered are listed, but only abnormal results are displayed) Results for orders placed or performed during the hospital  encounter of 07/04/22  Comprehensive metabolic panel  Result Value Ref Range   Sodium 132 (L) 135 - 145 mmol/L   Potassium 7.2 (HH) 3.5 - 5.1 mmol/L   Chloride 89 (L) 98 - 111 mmol/L   CO2 14 (L) 22 - 32 mmol/L   Glucose, Bld 99 70 - 99 mg/dL   BUN 108 (H) 6 - 20 mg/dL   Creatinine, Ser 20.00 (H) 0.61 - 1.24 mg/dL   Calcium 7.7 (L) 8.9 - 10.3 mg/dL   Total Protein 8.1 6.5 - 8.1 g/dL   Albumin 4.0 3.5 - 5.0 g/dL   AST 17 15 - 41 U/L   ALT 14 0 - 44 U/L   Alkaline Phosphatase 64 38 - 126 U/L   Total Bilirubin 0.8 0.3 - 1.2 mg/dL   GFR, Estimated 3 (L) >60 mL/min   Anion gap 29 (H) 5 - 15  CBC with Differential  Result Value Ref Range   WBC 7.4 4.0 - 10.5 K/uL   RBC 4.20 (L) 4.22 - 5.81 MIL/uL   Hemoglobin 13.0 13.0 - 17.0 g/dL   HCT 40.8 39.0 - 52.0 %   MCV 97.1 80.0 - 100.0 fL   MCH 31.0 26.0 - 34.0 pg   MCHC 31.9 30.0 - 36.0 g/dL   RDW 14.8 11.5 - 15.5 %   Platelets 288 150 - 400 K/uL   nRBC 0.0 0.0 - 0.2 %   Neutrophils Relative % 91 %   Neutro Abs 6.7 1.7 - 7.7 K/uL   Lymphocytes Relative 7 %   Lymphs Abs 0.5 (L) 0.7 - 4.0 K/uL   Monocytes Relative 2 %   Monocytes Absolute 0.1 0.1 - 1.0 K/uL   Eosinophils Relative 0 %   Eosinophils Absolute 0.0 0.0 - 0.5 K/uL   Basophils Relative 0 %   Basophils Absolute 0.0 0.0 - 0.1 K/uL   Immature Granulocytes 0 %   Abs Immature Granulocytes 0.02 0.00 - 0.07 K/uL   DG Chest Port 1 View  Result Date: 07/02/2022 CLINICAL DATA:  Possible sepsis. EXAM: PORTABLE CHEST 1 VIEW COMPARISON:  Chest radiograph 06/27/2022 FINDINGS: Of note, the patient is mildly rotated on this portable examination. The heart size and mediastinal contours are within normal limits. No focal consolidation, pleural effusion, or pneumothorax. No acute osseous abnormality. 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 IMPRESSION: No acute cardiopulmonary abnormality. Electronically Signed   By: Ileana Roup M.D.   On: 07/02/2022 10:49   IR US Guide  Vasc Access Right  Result Date: 06/29/2022 INDICATION: Clotted right thigh AV graft EXAM: Ultrasound-guided  access of AV graft Second ultrasound-guided access of AV graft Pharmacomechanical thrombectomy of clotted AV graft Balloon angioplasty of stenotic segment of AV graft venous outflow MEDICATIONS: Per EMR; 8 mg tPA, 3000 units heparin IV Contrast material: 85 mL Omnipaque 300 ANESTHESIA/SEDATION: Moderate (conscious) sedation was employed during this procedure. A total of Versed 2.5 mg and Fentanyl 125 mcg was administered intravenously by the radiology nurse. Total intra-service moderate Sedation Time: 78 minutes. The patient's level of consciousness and vital signs were monitored continuously by radiology nursing throughout the procedure under my direct supervision. FLUOROSCOPY: Radiation Exposure Index (as provided by the fluoroscopic device): 12 minutes (25 mGy) COMPLICATIONS: None immediate. PROCEDURE: Informed written consent was obtained from the patient after a thorough discussion of the procedural risks, benefits and alternatives. All questions were addressed. Maximal Sterile Barrier Technique was utilized including caps, mask, sterile gowns, sterile gloves, sterile drape, hand hygiene and skin antiseptic. A timeout was performed prior to the initiation of the procedure. The patient was placed supine on the exam table. The right thigh was prepped and draped in the standard sterile fashion with inclusion of the AV graft within the sterile field. Ultrasound was used to evaluate the AV graft, which found to be clotted. An ultrasound image was permanently stored in the electronic medical record. Using ultrasound guidance, the AV graft was directly punctured using a 21 gauge micropuncture set in an antegrade fashion near the arterial anastomosis. An 018 wire was advanced centrally, followed by serial tract dilation and placement of a short 7 French sheath. Using a combination of angled catheter and 035  wire, wire and catheter were advanced into the central venous outflow past the iliac vein stent. Pull-back venogram of the venous outflow was then performed to determine the extent of clot. The iliac vein was found to be patent. The clot extended to the venous anastomosis. The clot within the graft was then laced with 8 mg tPA. While this was allowed to dwell, a second retrograde access was obtained. Ultrasound was used to evaluate the AV graft, which was again found to be clotted. An ultrasound image was permanently stored in the electronic medical record. Using ultrasound guidance, the AV graft was directly punctured using a 21 gauge micropuncture set in an retrograde fashion near the apex of the loop. An 018 wire was easily advanced, followed by serial tract dilation and placement of a short 6 French sheath. After an appropriate dwell time of the tPA, mechanical thrombectomy of the clotted AV graft was then performed using a 7 mm balloon. Pulling of the arterial plug was then performed using the retrograde access with multiple sweeps with the Fogarty balloon. Angiography of the AV graft at this time demonstrated patent flow through the AV graft. There remains some residual clot near the apex of the loop graft which was successfully treated with repeat angioplasty. The venous outflow in the iliac vein stent was then restudy. There was an area of moderate 50-70% stenosis of the peripheral aspect of the iliac vein stent. This was successfully treated with balloon angioplasty up to 12 mm. There was appropriate luminal gain after angioplasty, with fast flow through the AV graft. At the end of the procedure, all wires and catheters were removed. Hemostasis was achieved at the access site using a combination of 0 silk suture and manual pressure. Clean dressings were placed. The patient tolerated the procedure well without immediate complication. IMPRESSION: 1. Successful pharmacomechanical thrombectomy of AV graft in  the right thigh. 2. Successful  balloon angioplasty of stenotic segment of the iliac vein stent up to 12 mm. ACCESS: This access remains amenable to future percutaneous interventions as clinically indicated. Electronically Signed   By: Albin Felling M.D.   On: 06/29/2022 13:56   IR THROMBECTOMY AV FISTULA/W THROMBOLYSIS/PTA INC SHUNT/IMG RIGHT  Result Date: 06/29/2022 INDICATION: Clotted right thigh AV graft EXAM: Ultrasound-guided access of AV graft Second ultrasound-guided access of AV graft Pharmacomechanical thrombectomy of clotted AV graft Balloon angioplasty of stenotic segment of AV graft venous outflow MEDICATIONS: Per EMR; 8 mg tPA, 3000 units heparin IV Contrast material: 85 mL Omnipaque 300 ANESTHESIA/SEDATION: Moderate (conscious) sedation was employed during this procedure. A total of Versed 2.5 mg and Fentanyl 125 mcg was administered intravenously by the radiology nurse. Total intra-service moderate Sedation Time: 78 minutes. The patient's level of consciousness and vital signs were monitored continuously by radiology nursing throughout the procedure under my direct supervision. FLUOROSCOPY: Radiation Exposure Index (as provided by the fluoroscopic device): 12 minutes (25 mGy) COMPLICATIONS: None immediate. PROCEDURE: Informed written consent was obtained from the patient after a thorough discussion of the procedural risks, benefits and alternatives. All questions were addressed. Maximal Sterile Barrier Technique was utilized including caps, mask, sterile gowns, sterile gloves, sterile drape, hand hygiene and skin antiseptic. A timeout was performed prior to the initiation of the procedure. The patient was placed supine on the exam table. The right thigh was prepped and draped in the standard sterile fashion with inclusion of the AV graft within the sterile field. Ultrasound was used to evaluate the AV graft, which found to be clotted. An ultrasound image was permanently stored in the electronic  medical record. Using ultrasound guidance, the AV graft was directly punctured using a 21 gauge micropuncture set in an antegrade fashion near the arterial anastomosis. An 018 wire was advanced centrally, followed by serial tract dilation and placement of a short 7 French sheath. Using a combination of angled catheter and 035 wire, wire and catheter were advanced into the central venous outflow past the iliac vein stent. Pull-back venogram of the venous outflow was then performed to determine the extent of clot. The iliac vein was found to be patent. The clot extended to the venous anastomosis. The clot within the graft was then laced with 8 mg tPA. While this was allowed to dwell, a second retrograde access was obtained. Ultrasound was used to evaluate the AV graft, which was again found to be clotted. An ultrasound image was permanently stored in the electronic medical record. Using ultrasound guidance, the AV graft was directly punctured using a 21 gauge micropuncture set in an retrograde fashion near the apex of the loop. An 018 wire was easily advanced, followed by serial tract dilation and placement of a short 6 French sheath. After an appropriate dwell time of the tPA, mechanical thrombectomy of the clotted AV graft was then performed using a 7 mm balloon. Pulling of the arterial plug was then performed using the retrograde access with multiple sweeps with the Fogarty balloon. Angiography of the AV graft at this time demonstrated patent flow through the AV graft. There remains some residual clot near the apex of the loop graft which was successfully treated with repeat angioplasty. The venous outflow in the iliac vein stent was then restudy. There was an area of moderate 50-70% stenosis of the peripheral aspect of the iliac vein stent. This was successfully treated with balloon angioplasty up to 12 mm. There was appropriate luminal gain after angioplasty, with fast  flow through the AV graft. At the end of the  procedure, all wires and catheters were removed. Hemostasis was achieved at the access site using a combination of 0 silk suture and manual pressure. Clean dressings were placed. The patient tolerated the procedure well without immediate complication. IMPRESSION: 1. Successful pharmacomechanical thrombectomy of AV graft in the right thigh. 2. Successful balloon angioplasty of stenotic segment of the iliac vein stent up to 12 mm. ACCESS: This access remains amenable to future percutaneous interventions as clinically indicated. Electronically Signed   By: Albin Felling M.D.   On: 06/29/2022 13:56   IR Fluoro Guide CV Line Right  Result Date: 06/28/2022 INDICATION: 51 year old with end-stage renal disease and occluded AV graft. Patient needs temporary dialysis catheter. EXAM: FLUOROSCOPIC AND ULTRASOUND GUIDED PLACEMENT OF A NON-TUNNELED DIALYSIS CATHETER Physician: Stephan Minister. Henn, MD MEDICATIONS: 1% lidocaine for local anesthetic ANESTHESIA/SEDATION: None FLUOROSCOPY TIME:  Radiation Exposure Index (as provided by the fluoroscopic device): 1 mGy Kerma COMPLICATIONS: None immediate. PROCEDURE: The procedure was explained to the patient. The risks and benefits of the procedure were discussed and the patient's questions were addressed. Informed consent was obtained from the patient. The patient was placed supine on the interventional table. Ultrasound confirmed a patent right internal jugular vein. Ultrasound images were obtained for documentation. The right neck was prepped and draped in a sterile fashion. The right neck was anesthetized with 1% lidocaine. Maximal barrier sterile technique was utilized including caps, mask, sterile gowns, sterile gloves, sterile drape, hand hygiene and skin antiseptic. A small incision was made with #11 blade scalpel. A 21 gauge needle directed into the right internal jugular vein with ultrasound guidance. A micropuncture dilator set was placed. A 20 cm Mahurkar catheter was  selected. The catheter was advanced over a wire and positioned at the superior cavoatrial junction. Fluoroscopic images were obtained for documentation. Both dialysis lumens were found to aspirate and flush well. The proper amount of heparin was flushed in both lumens. The central venous lumen was flushed with normal saline. Catheter was sutured to skin. Fluoroscopic and ultrasound images were taken and saved for documentation. FINDINGS: Catheter tip at the superior cavoatrial junction. IMPRESSION: Successful placement of a right jugular non-tunneled dialysis catheter using ultrasound and fluoroscopic guidance. Electronically Signed   By: Markus Daft M.D.   On: 06/28/2022 08:19   IR US Guide Vasc Access Right  Result Date: 06/28/2022 INDICATION: 51 year old with end-stage renal disease and occluded AV graft. Patient needs temporary dialysis catheter. EXAM: FLUOROSCOPIC AND ULTRASOUND GUIDED PLACEMENT OF A NON-TUNNELED DIALYSIS CATHETER Physician: Stephan Minister. Henn, MD MEDICATIONS: 1% lidocaine for local anesthetic ANESTHESIA/SEDATION: None FLUOROSCOPY TIME:  Radiation Exposure Index (as provided by the fluoroscopic device): 1 mGy Kerma COMPLICATIONS: None immediate. PROCEDURE: The procedure was explained to the patient. The risks and benefits of the procedure were discussed and the patient's questions were addressed. Informed consent was obtained from the patient. The patient was placed supine on the interventional table. Ultrasound confirmed a patent right internal jugular vein. Ultrasound images were obtained for documentation. The right neck was prepped and draped in a sterile fashion. The right neck was anesthetized with 1% lidocaine. Maximal barrier sterile technique was utilized including caps, mask, sterile gowns, sterile gloves, sterile drape, hand hygiene and skin antiseptic. A small incision was made with #11 blade scalpel. A 21 gauge needle directed into the right internal jugular vein with ultrasound  guidance. A micropuncture dilator set was placed. A 20 cm Mahurkar catheter was selected. The  catheter was advanced over a wire and positioned at the superior cavoatrial junction. Fluoroscopic images were obtained for documentation. Both dialysis lumens were found to aspirate and flush well. The proper amount of heparin was flushed in both lumens. The central venous lumen was flushed with normal saline. Catheter was sutured to skin. Fluoroscopic and ultrasound images were taken and saved for documentation. FINDINGS: Catheter tip at the superior cavoatrial junction. IMPRESSION: Successful placement of a right jugular non-tunneled dialysis catheter using ultrasound and fluoroscopic guidance. Electronically Signed   By: Markus Daft M.D.   On: 06/28/2022 08:19   DG Chest 2 View  Result Date: 06/27/2022 CLINICAL DATA:  Shortness of breath. EXAM: CHEST - 2 VIEW COMPARISON:  Chest x-ray 06/12/2022. FINDINGS: The heart size and mediastinal contours are within normal limits. Both lungs are clear. No visible pleural effusions or pneumothorax. No acute osseous abnormality. IMPRESSION: No evidence of acute cardiopulmonary disease. Electronically Signed   By: Margaretha Sheffield M.D.   On: 06/27/2022 10:45   VAS [place] ABI WITH/WO TBI  Result Date: 06/23/2022  LOWER EXTREMITY DOPPLER STUDY Patient Name:  Samik Balkcom [facility]  Date of Exam:   06/23/2022 Medical Rec #: [id number]              Accession #:    [id number] Date of Birth: [birth year]              Patient Gender: M Patient Age:   47 years Exam Location:  [facility] Procedure:      VAS [place] ABI WITH/WO TBI Referring Phys: Lawson Radar --------------------------------------------------------------------------------  Indications: Pain and tingling of feet High Risk Factors: Hypertension. Other Factors: ESRD, neuropathy.  Comparison Study: No prior study Performing Technologist: Sharion Dove RVS  Examination Guidelines: A complete evaluation includes at minimum,  Doppler waveform signals and systolic blood pressure reading at the level of bilateral brachial, anterior tibial, and posterior tibial arteries, when vessel segments are accessible. Bilateral testing is considered an integral part of a complete examination. Photoelectric Plethysmograph (PPG) waveforms and toe systolic pressure readings are included as required and additional duplex testing as needed. Limited examinations for reoccurring indications may be performed as noted.  ABI Findings: +---------+------------------+-----+-----------+--------+ Right    Rt Pressure (mmHg)IndexWaveform   Comment  +---------+------------------+-----+-----------+--------+ Brachial 137                    triphasic           +---------+------------------+-----+-----------+--------+ PTA      155               1.13 multiphasic         +---------+------------------+-----+-----------+--------+ DP       156               1.14 multiphasic         +---------+------------------+-----+-----------+--------+ Great Toe60                0.44                     +---------+------------------+-----+-----------+--------+ +---------+------------------+-----+-----------+---------+ Left     Lt Pressure (mmHg)IndexWaveform   Comment   +---------+------------------+-----+-----------+---------+ Brachial                                   Old graft +---------+------------------+-----+-----------+---------+ PTA      151  1.10 multiphasic          +---------+------------------+-----+-----------+---------+ DP       162               1.18 multiphasic          +---------+------------------+-----+-----------+---------+ Great Toe78                0.57                      +---------+------------------+-----+-----------+---------+ +-------+-----------+-----------+------------+------------+ ABI/TBIToday's ABIToday's TBIPrevious ABIPrevious TBI  +-------+-----------+-----------+------------+------------+ Right  1.14       0.44                                +-------+-----------+-----------+------------+------------+ Left   1.18       0.57                                +-------+-----------+-----------+------------+------------+   Summary: Right: Resting right ankle-brachial index is within normal range. The right toe-brachial index is abnormal. Left: Resting left ankle-brachial index is within normal range. The left toe-brachial index is abnormal. *See table(s) above for measurements and observations.  Electronically signed by Monica Martinez MD on 06/23/2022 at 12:38:38 PM.    Final    VAS [place] LOWER EXTREMITY VENOUS (DVT)  Result Date: 06/23/2022  Lower Venous DVT Study Patient Name:  NAZIM KADLEC  Date of Exam:   06/23/2022 Medical Rec #: [id number]              Accession #:    [id number] Date of Birth: 13-Dec-1970              Patient Gender: M Patient Age:   43 years Exam Location:  [facility] Procedure:      VAS [place] LOWER EXTREMITY VENOUS (DVT) Referring Phys: Lawson Radar --------------------------------------------------------------------------------  Indications: Numbness and tingling of left foot.  Comparison Study: No prior left LEV Performing Technologist: Sharion Dove RVS  Examination Guidelines: A complete evaluation includes B-mode imaging, spectral Doppler, color Doppler, and power Doppler as needed of all accessible portions of each vessel. Bilateral testing is considered an integral part of a complete examination. Limited examinations for reoccurring indications may be performed as noted. The reflux portion of the exam is performed with the patient in reverse Trendelenburg.  +-----+---------------+---------+-----------+----------+--------------+ RIGHTCompressibilityPhasicitySpontaneityPropertiesThrombus Aging +-----+---------------+---------+-----------+----------+--------------+ CFV  Full            Yes      Yes                                 +-----+---------------+---------+-----------+----------+--------------+ Incidental finding: Right lower extremity dialysis access is thrombosed  +---------+---------------+---------+-----------+----------+--------------+ LEFT     CompressibilityPhasicitySpontaneityPropertiesThrombus Aging +---------+---------------+---------+-----------+----------+--------------+ CFV      Full           Yes      Yes                                 +---------+---------------+---------+-----------+----------+--------------+ SFJ      Full                                                        +---------+---------------+---------+-----------+----------+--------------+  FV Prox  Full                                                        +---------+---------------+---------+-----------+----------+--------------+ FV Mid   Full                                                        +---------+---------------+---------+-----------+----------+--------------+ FV DistalFull                                                        +---------+---------------+---------+-----------+----------+--------------+ PFV      Full                                                        +---------+---------------+---------+-----------+----------+--------------+ POP      Full           Yes      Yes                                 +---------+---------------+---------+-----------+----------+--------------+ PTV      Full                                                        +---------+---------------+---------+-----------+----------+--------------+ PERO     Full                                                        +---------+---------------+---------+-----------+----------+--------------+     Summary: RIGHT: - No evidence of common femoral vein obstruction. 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  LEFT: - There is no evidence of deep vein  thrombosis in the lower extremity.  *See table(s) above for measurements and observations. Electronically signed by Monica Martinez MD on 06/23/2022 at 12:38:24 PM.    Final    CT Head Wo Contrast  Result Date: 06/12/2022 CLINICAL DATA:  BLUNT POLY TRAUMA, DIZZINESS AND CHEST PAIN, HEADACHE FOR 2 WEEKS. EXAM: CT HEAD WITHOUT CONTRAST TECHNIQUE: Contiguous axial images were obtained from the base of the skull through the vertex without intravenous contrast. RADIATION DOSE REDUCTION: This exam was performed according to the departmental dose-optimization program which includes automated exposure control, adjustment of the mA and/or kV according to patient size and/or use of iterative reconstruction technique. COMPARISON:  HEAD CT 03/27/2023 FINDINGS: Brain: There is mild global atrophy including centrally but it is greater than typically seen in a 50 year old. The gray-white matter differentiation and attenuation are normal. No infarct, hemorrhage  or mass are seen. Ventricles are normal in size and position. Basal cisterns are clear. Vascular: There are scattered calcifications of the carotid siphons. There are no hyperdense central vessels. Skull: Negative for fracture or focal lesion. No scalp hematoma is seen. Sinuses/Orbits: No acute abnormality. Unremarkable orbital contents. 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 IMPRESSION: No acute intracranial CT findings, depressed skull fractures or interval changes. Electronically Signed   By: Telford Nab M.D.   On: 06/12/2022 07:27   DG Chest Portable 1 View  Result Date: 06/12/2022 CLINICAL DATA:  Short of breath with dizziness. EXAM: PORTABLE CHEST 1 VIEW COMPARISON:  03/28/2022 FINDINGS: Heart size and mediastinal contours are unremarkable. There is no pleural effusion or  edema identified. No airspace opacities identified. IMPRESSION: No active disease. Electronically Signed   By: Kerby Moors M.D.   On: 06/12/2022 06:41    EKG EKG Interpretation  Date/Time:  Monday July 04 2022 17:53:08 EDT Ventricular Rate:  70 PR Interval:  199 QRS Duration: 95 QT Interval:  470 QTC Calculation: 508 R Axis:   60 Text Interpretation: Sinus rhythm Prolonged QT interval `prominent t waves c/w hyperkalemia Confirmed by Lajean Saver [phone]) on 07/08/2022 1:40:31 PM    Procedures Procedures    Medications Ordered in ED Medications  diphenhydrAMINE (BENADRYL) capsule 50 mg (has no administration in time range)    Or  diphenhydrAMINE (BENADRYL) injection 50 mg (has no administration in time range)  predniSONE (DELTASONE) tablet 50 mg (has no administration in time range)  metoprolol tartrate (LOPRESSOR) tablet 100 mg (has no administration in time range)  Chlorhexidine Gluconate Cloth 2 % PADS 6 each (has no administration in time range)  pentafluoroprop-tetrafluoroeth (GEBAUERS) aerosol 1 Application (has no administration in time range)  lidocaine (PF) (XYLOCAINE) 1 % injection 5 mL (has no administration in time range)  lidocaine-prilocaine (EMLA) cream 1 Application (has no administration in time range)  heparin injection 1,000 Units (has no administration in time range)  alteplase (CATHFLO ACTIVASE) injection 2 mg (has no administration in time range)  heparin injection 1,500 Units (has no administration in time range)  predniSONE (DELTASONE) tablet 50 mg (has no administration in time range)  predniSONE (DELTASONE) tablet 50 mg (has no administration in time range)  albuterol (PROVENTIL) (2.5 MG/3ML) 0.083% nebulizer solution 10 mg (has no administration in time range)  sodium zirconium cyclosilicate (LOKELMA) packet 10 g (10 g Oral Given 07/04/22 1702)  calcium gluconate inj 10% (1 g) URGENT USE ONLY! (1 g Intravenous Given 07/04/22 1800)  insulin aspart  (novoLOG) injection 5 Units (5 Units Intravenous Given 07/04/22 1759)    And  dextrose 50 % solution 50 mL (50 mLs Intravenous Given 07/04/22 1759)  sodium bicarbonate injection 50 mEq (50 mEq Intravenous Given 07/04/22 1759)    ED Course/ Medical Decision Making/ A&P                           Medical Decision Making Problems Addressed: ESRD needing dialysis [facility]): acute illness or injury with systemic symptoms that poses a threat to life or bodily functions Hyperkalemia: acute illness or injury with systemic symptoms that poses a threat to life or bodily functions  Amount and/or Complexity of Data Reviewed Independent Historian:     Details: Renal md, hx External Data Reviewed: labs,  radiology and notes. Labs: ordered. Decision-making details documented in ED Course. Radiology: independent interpretation performed. Decision-making details documented in ED Course. ECG/medicine tests: ordered and independent interpretation performed. Decision-making details documented in ED Course. Discussion of management or test interpretation with external provider(s): Nephrology, discussed pt, medicine discussed pt.   Risk Prescription drug management. Decision regarding hospitalization.   Iv ns. Continuous pulse ox and cardiac monitoring. Labs ordered/sent  Reviewed nursing notes and prior charts for additional history. External reports reviewed. Additional history from: renal  Cardiac monitor: sinus rhythm, rate 74.  Labs reviewed/interpreted by me - k v high.   Recent Xrays reviewed/interpreted by me - no edema.   Bicarb iv, glucose iv, insulin iv, lokelma po.  Renal consulted, discussed high K - he indicates admit to hospitalist, they will follow/consult, plan for declot and hd tomorrow.  CRITICAL CARE  RE: ESRD/HD with severe hyperkalemia, needing dialysis.  Performed by: Mirna Mires Total critical care time: 40 minutes Critical care time was exclusive of separately billable  procedures and treating other patients. Critical care was necessary to treat or prevent imminent or life-threatening deterioration. Critical care was time spent personally by me on the following activities: development of treatment plan with patient and/or surrogate as well as nursing, discussions with consultants, evaluation of patient's response to treatment, examination of patient, obtaining history from patient or surrogate, ordering and performing treatments and interventions, ordering and review of laboratory studies, ordering and review of radiographic studies, pulse oximetry and re-evaluation of patient's condition.          Final Clinical Impression(s) / ED Diagnoses Final diagnoses:  None    Rx / DC Orders ED Discharge Orders     None         Lajean Saver, MD 07/08/22 1342

## 2022-07-04 NOTE — Assessment & Plan Note (Signed)
Optimize electrolytes Keep on telemetry Avoid qt prolonging drugs  Repeat ekg in AM   

## 2022-07-04 NOTE — Progress Notes (Signed)
Plan to perform CCTA tomorrow 10/3 at 12:00pm  Pt will need 13 hr prep prior to this scan due to contrast allergy. Will repeat dose of benadryl prior to IR declot procedure.  Pt will need an 18g in the [facility] for contrasted CCTA. NO CAFFEINE 12 hr prior to scan  Pt to go to IR after CT scan complete.  Marchia Bond RN Navigator Cardiac Imaging [facility] [phone] Office  [phone] Cell

## 2022-07-04 NOTE — Consult Note (Signed)
Chief Complaint: Patient was seen in consultation today for clotted AVG  Referring Physician(s): Dr. Renaldo Reel  Supervising Physician: Daryll Brod  Patient Status: [facility] - ED  History of Present Illness: [name] is a 51 y.o. male with medical history significant of ESRD on HD MWF, HTN, chronic HFrEF, recurrent right thigh AV shunt malfunction who last presented to [facility] Radiology 06/28/22 for declotting procedure.  His graft was successfully restored, however over the weekend his graft was again found without bruit/thrill and unable to access.  Since last declotting procedure patient was able to undergo dialysis x1. He presented to [facility] Radiology for declot this AM however was unprepared for procedure with contrast and sedation.  Plan made with Nephrology to observe overnight in the ED, pre-medicate and proceed with declotting procedure vs. Tunneled HD catheter tomorrow in IR.   Past Medical History:  Diagnosis Date   Anemia    Anxiety    Depression    ESRD on hemodialysis ([place])    HD [place] MWF   Hypertension    Thyroid disease     Past Surgical History:  Procedure Laterality Date   ARTERIOVENOUS GRAFT PLACEMENT Left    "forearm, it's not working; thigh"    ARTERIOVENOUS GRAFT PLACEMENT Left 11/09/2015   AV FISTULA PLACEMENT Right 01/10/2017   Procedure: INSERTION OF ARTERIOVENOUS Right thigh GORE-TEX GRAFT;  Surgeon: [name], MD;  Location: [place];  Service: Vascular;  Laterality: Right;   FALSE ANEURYSM REPAIR Left 11/09/2015   Procedure: REPAIR OF LEFT FEMORAL PSEUDOANEURYSM; REVISION  OF LEFT THIGH ARTERIOVENOUS GRAFT USING 6MM X 10 CM GORETEX GRAFT ;  Surgeon: [name], MD;  Location: [place];  Service: Vascular;  Laterality: Left;   IR AV DIALY SHUNT INTRO NEEDLE/INTRACATH INITIAL W/PTA/IMG RIGHT Right 02/06/2019   IR AV DIALY SHUNT INTRO NEEDLE/INTRACATH INITIAL W/PTA/IMG RIGHT Right 05/14/2019   IR AV DIALY SHUNT INTRO NEEDLE/INTRACATH  INITIAL W/PTA/IMG RIGHT Right 08/18/2020   IR AV DIALY SHUNT INTRO NEEDLE/INTRACATH INITIAL W/PTA/IMG RIGHT Right 04/21/2022   IR DIALY SHUNT INTRO NEEDLE/INTRACATH INITIAL W/IMG RIGHT Right 09/17/2019   IR DIALY SHUNT INTRO NEEDLE/INTRACATH INITIAL W/IMG RIGHT Right 12/10/2019   IR FLUORO GUIDE CV LINE RIGHT  07/10/2021   IR FLUORO GUIDE CV LINE RIGHT  06/27/2022   IR GENERIC HISTORICAL  07/15/2016   IR US GUIDE VASC ACCESS LEFT 07/15/2016 [name], MD [facility]   IR GENERIC HISTORICAL Left 07/15/2016   IR THROMBECTOMY AV FISTULA W/THROMBOLYSIS/PTA INC/SHUNT/IMG LEFT 07/15/2016 [name], MD [facility]   IR GENERIC HISTORICAL  10/05/2016   IR US GUIDE VASC ACCESS LEFT 10/05/2016 [name], MD [facility]   IR GENERIC HISTORICAL Left 10/05/2016   IR THROMBECTOMY AV FISTULA W/THROMBOLYSIS/PTA INC/SHUNT/IMG LEFT 10/05/2016 [name], MD [facility]   IR GENERIC HISTORICAL  10/08/2016   IR FLUORO GUIDE CV LINE RIGHT 10/08/2016 [name], MD [facility]   IR GENERIC HISTORICAL  10/08/2016   IR US GUIDE VASC ACCESS RIGHT 10/08/2016 [name], MD [facility]   IR PTA AND STENT ADDL CENTRAL DIALY SEG THRU DIALY CIRCUIT RIGHT Right 12/10/2019   IR RADIOLOGIST EVAL & MGMT  11/26/2019   IR REMOVAL TUN CV CATH W/O FL  03/16/2017   IR REMOVAL TUN CV CATH W/O FL  07/22/2021   IR THROMBECTOMY AV FISTULA W/THROMBOLYSIS/PTA INC/SHUNT/IMG RIGHT Right 06/16/2018   IR THROMBECTOMY AV FISTULA W/THROMBOLYSIS/PTA INC/SHUNT/IMG RIGHT Right 06/17/2018   IR THROMBECTOMY AV FISTULA W/THROMBOLYSIS/PTA  INC/SHUNT/IMG RIGHT Right 07/15/2021   IR THROMBECTOMY AV FISTULA W/THROMBOLYSIS/PTA INC/SHUNT/IMG RIGHT Right 06/29/2022   IR US GUIDE VASC ACCESS RIGHT  06/17/2018   IR US GUIDE VASC ACCESS RIGHT  06/16/2018   IR US GUIDE VASC ACCESS RIGHT  02/06/2019   IR US GUIDE VASC ACCESS RIGHT  05/14/2019   IR US GUIDE VASC ACCESS RIGHT  09/17/2019   IR US GUIDE VASC ACCESS RIGHT  12/10/2019   IR US GUIDE VASC ACCESS RIGHT   08/18/2020   IR US GUIDE VASC ACCESS RIGHT  07/10/2021   IR US GUIDE VASC ACCESS RIGHT  07/19/2021   IR US GUIDE VASC ACCESS RIGHT  04/21/2022   IR US GUIDE VASC ACCESS RIGHT  06/27/2022   IR US GUIDE VASC ACCESS RIGHT  06/29/2022   PARATHYROIDECTOMY N/A 04/24/2014   Procedure: TOTAL 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;  Surgeon: [name], MD;  Location: [place];  Service: General;  Laterality: N/A;  NECK AND LEFT FOREARM   PERIPHERAL VASCULAR CATHETERIZATION N/A 05/05/2016   Procedure: A/V Shuntogram;  Surgeon: Conrad Domino, MD;  Location: [place] CV LAB;  Service: Cardiovascular;  Laterality: N/A;   PERIPHERAL VASCULAR CATHETERIZATION Left 05/05/2016   Procedure: Peripheral Vascular Balloon Angioplasty;  Surgeon: Conrad [place], MD;  Location: [place] CV LAB;  Service: Cardiovascular;  Laterality: Left;   PSEUDOANEURYSM REPAIR Left 11/09/2015   TEE WITHOUT CARDIOVERSION N/A 02/19/2021   Procedure: TRANSESOPHAGEAL ECHOCARDIOGRAM (TEE);  Surgeon: [name], MD;  Location: [facility] ENDOSCOPY;  Service: Cardiovascular;  Laterality: N/A;   THROMBECTOMY / ARTERIOVENOUS GRAFT REVISION Left 08/18/2015   thigh   THROMBECTOMY AND REVISION OF ARTERIOVENTOUS (AV) GORETEX  GRAFT Left 08/23/2015   Procedure: THROMBECTOMY AND REVISION OF ARTERIOVENTOUS (AV) GORETEX  GRAFT LEFT THIGH ;  Surgeon: [name], MD;  Location: [place];  Service: Vascular;  Laterality: Left;   THROMBECTOMY W/ EMBOLECTOMY Left 08/18/2015   Procedure: THROMBECTOMY  AND REVISION ARTERIOVENOUS GORE-TEX GRAFT/LEFT THIGH;  Surgeon: [name], MD;  Location: [place];  Service: Vascular;  Laterality: Left;   THROMBECTOMY W/ EMBOLECTOMY Right 06/19/2018   Procedure: THROMBECTOMY AND REVISION OF RIGHT THIGH  ARTERIOVENOUS GORE-TEX GRAFT;  Surgeon: [name], MD;  Location: [facility] OR;  Service: Vascular;  Laterality: Right;   UPPER EXTREMITY VENOGRAPHY Bilateral 12/27/2016   Procedure: Upper Extremity  Venography;  Surgeon: [name], MD;  Location: [place] CV LAB;  Service: Cardiovascular;  Laterality: Bilateral;   VENOGRAM Left 05/05/2016   Procedure: Venogram;  Surgeon: Conrad Lookout, MD;  Location: [place] CV LAB;  Service: Cardiovascular;  Laterality: Left;  lower extremity    Allergies: Ivp dye [iodinated contrast media], Lisinopril, and Solu-medrol [methylprednisolone]  Medications: Prior to Admission medications   Medication Sig Start Date End Date Taking? Authorizing Provider  acetaminophen (TYLENOL) 500 MG tablet Take 1,000 mg by mouth daily as needed (for pain or headaches).     [provider]  B Complex-C-Folic Acid (DIALYVITE TABLET) TABS Take 1 tablet by mouth daily. 06/29/20   [provider]  predniSONE (DELTASONE) 50 MG tablet One tablet every 6 hours starting Sunday (10/1) at 2300 (11pm) for a total of 3 doses 07/03/22   Karmen Bongo, MD  TUMS ULTRA 1000 1000 MG chewable tablet Chew 4,000 mg by mouth 3 (three) times daily with meals. 03/14/20   [provider]  fluticasone (FLONASE) 50 MCG/ACT nasal spray Place 2 sprays into both nostrils daily. Patient not  taking: Reported on 06/15/2018 07/25/17 02/20/20  Larene Pickett, PA-C     Family History  Problem Relation Age of Onset   Renal Disease Neg Hx     Social History   Socioeconomic History   Marital status: Married    Spouse name: Not on file   Number of children: Not on file   Years of education: Not on file   Highest education level: Not on file  Occupational History   Not on file  Tobacco Use   Smoking status: Former    Types: Cigarettes    Quit date: 03/21/1986    Years since quitting: 36.3   Smokeless tobacco: Never  Vaping Use   Vaping Use: Never used  Substance and Sexual Activity   Alcohol use: No    Alcohol/week: 0.0 standard drinks of alcohol   Drug use: No   Sexual activity: Not on file  Other Topics Concern   Not on file  Social History Narrative    Not on file   Social Determinants of Health   Financial Resource Strain: Not on file  Food Insecurity: Not on file  Transportation Needs: Not on file  Physical Activity: Not on file  Stress: Not on file  Social Connections: Not on file     Review of Systems: A 12 point ROS discussed and pertinent positives are indicated in the HPI above.  All other systems are negative.  Review of Systems  Constitutional:  Negative for fatigue and fever.  Respiratory:  Negative for cough and shortness of breath.   Cardiovascular:  Negative for chest pain.  Gastrointestinal:  Negative for abdominal pain.  Musculoskeletal:  Negative for back pain.  Psychiatric/Behavioral:  Negative for behavioral problems and confusion.     Vital Signs: BP (!) 143/97   Pulse 72   Temp 98.2 F (36.8 C) (Oral)   Resp (!) 24   Ht 6' (1.829 m)   Wt 145 lb (65.8 kg)   SpO2 100%   BMI 19.67 kg/m   Physical Exam Vitals and nursing note reviewed.  Constitutional:      General: He is not in acute distress.    Appearance: Normal appearance. He is not ill-appearing.  HENT:     Mouth/Throat:     Mouth: Mucous membranes are moist.     Pharynx: Oropharynx is clear.  Cardiovascular:     Rate and Rhythm: Normal rate and regular rhythm.  Pulmonary:     Effort: Pulmonary effort is normal.  Skin:    General: Skin is warm.  Neurological:     General: No focal deficit present.     Mental Status: He is alert and oriented to person, place, and time. Mental status is at baseline.  Psychiatric:        Mood and Affect: Mood normal.        Behavior: Behavior normal.        Thought Content: Thought content normal.        Judgment: Judgment normal.      MD Evaluation Airway: WNL Heart: WNL Abdomen: WNL Chest/ Lungs: WNL ASA  Classification: 3 Mallampati/Airway Score: Two   Imaging: DG Chest Port 1 View  Result Date: 07/02/2022 CLINICAL DATA:  Possible sepsis. EXAM: PORTABLE CHEST 1 VIEW COMPARISON:  Chest  radiograph 06/27/2022 FINDINGS: Of note, the patient is mildly rotated on this portable examination. The heart size and mediastinal contours are within normal limits. No focal consolidation, pleural effusion, or pneumothorax. No acute osseous abnormality. 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 IMPRESSION: No acute cardiopulmonary abnormality. Electronically Signed   By: Ileana Roup M.D.   On: 07/02/2022 10:49   IR US Guide Vasc Access Right  Result Date: 06/29/2022 INDICATION: Clotted right thigh AV graft EXAM: Ultrasound-guided access of AV graft Second ultrasound-guided access of AV graft Pharmacomechanical thrombectomy of clotted AV graft Balloon angioplasty of stenotic segment of AV graft venous outflow MEDICATIONS: Per EMR; 8 mg tPA, 3000 units heparin IV Contrast material: 85 mL Omnipaque 300 ANESTHESIA/SEDATION: Moderate (conscious) sedation was employed during this procedure. A total of Versed 2.5 mg and Fentanyl 125 mcg was administered intravenously by the radiology nurse. Total intra-service moderate Sedation Time: 78 minutes. The patient's level of consciousness and vital signs were monitored continuously by radiology nursing throughout the procedure under my direct supervision. FLUOROSCOPY: Radiation Exposure Index (as provided by the fluoroscopic device): 12 minutes (25 mGy) COMPLICATIONS: None immediate. PROCEDURE: Informed written consent was obtained from the patient after a thorough discussion of the procedural risks, benefits and alternatives. All questions were addressed. Maximal Sterile Barrier Technique was utilized including caps, mask, sterile gowns, sterile gloves, sterile drape, hand hygiene and skin antiseptic. A timeout was performed prior to the initiation of the procedure. The patient was placed supine on the exam table. The right thigh was prepped and draped in the standard sterile fashion with inclusion of the AV graft within the sterile field. Ultrasound was  used to evaluate the AV graft, which found to be clotted. An ultrasound image was permanently stored in the electronic medical record. Using ultrasound guidance, the AV graft was directly punctured using a 21 gauge micropuncture set in an antegrade fashion near the arterial anastomosis. An 018 wire was advanced centrally, followed by serial tract dilation and placement of a short 7 French sheath. Using a combination of angled catheter and 035 wire, wire and catheter were advanced into the central venous outflow past the iliac vein stent. Pull-back venogram of the venous outflow was then performed to determine the extent of clot. The iliac vein was found to be patent. The clot extended to the venous anastomosis. The clot within the graft was then laced with 8 mg tPA. While this was allowed to dwell, a second retrograde access was obtained. Ultrasound was used to evaluate the AV graft, which was again found to be clotted. An ultrasound image was permanently stored in the electronic medical record. Using ultrasound guidance, the AV graft was directly punctured using a 21 gauge micropuncture set in an retrograde fashion near the apex of the loop. An 018 wire was easily advanced, followed by serial tract dilation and placement of a short 6 French sheath. After an appropriate dwell time of the tPA, mechanical thrombectomy of the clotted AV graft was then performed using a 7 mm balloon. Pulling of the arterial plug was then performed using the retrograde access with multiple sweeps with the Fogarty balloon. Angiography of the AV graft at this time demonstrated patent flow through the AV graft. There remains some residual clot near the apex of the loop graft which was successfully treated with repeat angioplasty. The venous outflow in the iliac vein stent was then restudy. There was an area of moderate 50-70% stenosis of the peripheral aspect of the iliac vein stent. This was successfully treated with balloon angioplasty up  to 12 mm. There was appropriate luminal gain after angioplasty, with fast flow through the AV graft. At the end of the procedure, all wires and catheters were removed.  Hemostasis was achieved at the access site using a combination of 0 silk suture and manual pressure. Clean dressings were placed. The patient tolerated the procedure well without immediate complication. IMPRESSION: 1. Successful pharmacomechanical thrombectomy of AV graft in the right thigh. 2. Successful balloon angioplasty of stenotic segment of the iliac vein stent up to 12 mm. ACCESS: This access remains amenable to future percutaneous interventions as clinically indicated. Electronically Signed   By: Albin Felling M.D.   On: 06/29/2022 13:56   IR THROMBECTOMY AV FISTULA/W THROMBOLYSIS/PTA INC SHUNT/IMG RIGHT  Result Date: 06/29/2022 INDICATION: Clotted right thigh AV graft EXAM: Ultrasound-guided access of AV graft Second ultrasound-guided access of AV graft Pharmacomechanical thrombectomy of clotted AV graft Balloon angioplasty of stenotic segment of AV graft venous outflow MEDICATIONS: Per EMR; 8 mg tPA, 3000 units heparin IV Contrast material: 85 mL Omnipaque 300 ANESTHESIA/SEDATION: Moderate (conscious) sedation was employed during this procedure. A total of Versed 2.5 mg and Fentanyl 125 mcg was administered intravenously by the radiology nurse. Total intra-service moderate Sedation Time: 78 minutes. The patient's level of consciousness and vital signs were monitored continuously by radiology nursing throughout the procedure under my direct supervision. FLUOROSCOPY: Radiation Exposure Index (as provided by the fluoroscopic device): 12 minutes (25 mGy) COMPLICATIONS: None immediate. PROCEDURE: Informed written consent was obtained from the patient after a thorough discussion of the procedural risks, benefits and alternatives. All questions were addressed. Maximal Sterile Barrier Technique was utilized including caps, mask, sterile gowns,  sterile gloves, sterile drape, hand hygiene and skin antiseptic. A timeout was performed prior to the initiation of the procedure. The patient was placed supine on the exam table. The right thigh was prepped and draped in the standard sterile fashion with inclusion of the AV graft within the sterile field. Ultrasound was used to evaluate the AV graft, which found to be clotted. An ultrasound image was permanently stored in the electronic medical record. Using ultrasound guidance, the AV graft was directly punctured using a 21 gauge micropuncture set in an antegrade fashion near the arterial anastomosis. An 018 wire was advanced centrally, followed by serial tract dilation and placement of a short 7 French sheath. Using a combination of angled catheter and 035 wire, wire and catheter were advanced into the central venous outflow past the iliac vein stent. Pull-back venogram of the venous outflow was then performed to determine the extent of clot. The iliac vein was found to be patent. The clot extended to the venous anastomosis. The clot within the graft was then laced with 8 mg tPA. While this was allowed to dwell, a second retrograde access was obtained. Ultrasound was used to evaluate the AV graft, which was again found to be clotted. An ultrasound image was permanently stored in the electronic medical record. Using ultrasound guidance, the AV graft was directly punctured using a 21 gauge micropuncture set in an retrograde fashion near the apex of the loop. An 018 wire was easily advanced, followed by serial tract dilation and placement of a short 6 French sheath. After an appropriate dwell time of the tPA, mechanical thrombectomy of the clotted AV graft was then performed using a 7 mm balloon. Pulling of the arterial plug was then performed using the retrograde access with multiple sweeps with the Fogarty balloon. Angiography of the AV graft at this time demonstrated patent flow through the AV graft. There  remains some residual clot near the apex of the loop graft which was successfully treated with repeat angioplasty. The venous outflow  in the iliac vein stent was then restudy. There was an area of moderate 50-70% stenosis of the peripheral aspect of the iliac vein stent. This was successfully treated with balloon angioplasty up to 12 mm. There was appropriate luminal gain after angioplasty, with fast flow through the AV graft. At the end of the procedure, all wires and catheters were removed. Hemostasis was achieved at the access site using a combination of 0 silk suture and manual pressure. Clean dressings were placed. The patient tolerated the procedure well without immediate complication. IMPRESSION: 1. Successful pharmacomechanical thrombectomy of AV graft in the right thigh. 2. Successful balloon angioplasty of stenotic segment of the iliac vein stent up to 12 mm. ACCESS: This access remains amenable to future percutaneous interventions as clinically indicated. Electronically Signed   By: Albin Felling M.D.   On: 06/29/2022 13:56   IR Fluoro Guide CV Line Right  Result Date: 06/28/2022 INDICATION: 51 year old with end-stage renal disease and occluded AV graft. Patient needs temporary dialysis catheter. EXAM: FLUOROSCOPIC AND ULTRASOUND GUIDED PLACEMENT OF A NON-TUNNELED DIALYSIS CATHETER Physician: Stephan Minister. Henn, MD MEDICATIONS: 1% lidocaine for local anesthetic ANESTHESIA/SEDATION: None FLUOROSCOPY TIME:  Radiation Exposure Index (as provided by the fluoroscopic device): 1 mGy Kerma COMPLICATIONS: None immediate. PROCEDURE: The procedure was explained to the patient. The risks and benefits of the procedure were discussed and the patient's questions were addressed. Informed consent was obtained from the patient. The patient was placed supine on the interventional table. Ultrasound confirmed a patent right internal jugular vein. Ultrasound images were obtained for documentation. The right neck was prepped  and draped in a sterile fashion. The right neck was anesthetized with 1% lidocaine. Maximal barrier sterile technique was utilized including caps, mask, sterile gowns, sterile gloves, sterile drape, hand hygiene and skin antiseptic. A small incision was made with #11 blade scalpel. A 21 gauge needle directed into the right internal jugular vein with ultrasound guidance. A micropuncture dilator set was placed. A 20 cm Mahurkar catheter was selected. The catheter was advanced over a wire and positioned at the superior cavoatrial junction. Fluoroscopic images were obtained for documentation. Both dialysis lumens were found to aspirate and flush well. The proper amount of heparin was flushed in both lumens. The central venous lumen was flushed with normal saline. Catheter was sutured to skin. Fluoroscopic and ultrasound images were taken and saved for documentation. FINDINGS: Catheter tip at the superior cavoatrial junction. IMPRESSION: Successful placement of a right jugular non-tunneled dialysis catheter using ultrasound and fluoroscopic guidance. Electronically Signed   By: Markus Daft M.D.   On: 06/28/2022 08:19   IR US Guide Vasc Access Right  Result Date: 06/28/2022 INDICATION: 51 year old with end-stage renal disease and occluded AV graft. Patient needs temporary dialysis catheter. EXAM: FLUOROSCOPIC AND ULTRASOUND GUIDED PLACEMENT OF A NON-TUNNELED DIALYSIS CATHETER Physician: Stephan Minister. Henn, MD MEDICATIONS: 1% lidocaine for local anesthetic ANESTHESIA/SEDATION: None FLUOROSCOPY TIME:  Radiation Exposure Index (as provided by the fluoroscopic device): 1 mGy Kerma COMPLICATIONS: None immediate. PROCEDURE: The procedure was explained to the patient. The risks and benefits of the procedure were discussed and the patient's questions were addressed. Informed consent was obtained from the patient. The patient was placed supine on the interventional table. Ultrasound confirmed a patent right internal jugular vein.  Ultrasound images were obtained for documentation. The right neck was prepped and draped in a sterile fashion. The right neck was anesthetized with 1% lidocaine. Maximal barrier sterile technique was utilized including caps, mask, sterile gowns, sterile gloves,  sterile drape, hand hygiene and skin antiseptic. A small incision was made with #11 blade scalpel. A 21 gauge needle directed into the right internal jugular vein with ultrasound guidance. A micropuncture dilator set was placed. A 20 cm Mahurkar catheter was selected. The catheter was advanced over a wire and positioned at the superior cavoatrial junction. Fluoroscopic images were obtained for documentation. Both dialysis lumens were found to aspirate and flush well. The proper amount of heparin was flushed in both lumens. The central venous lumen was flushed with normal saline. Catheter was sutured to skin. Fluoroscopic and ultrasound images were taken and saved for documentation. FINDINGS: Catheter tip at the superior cavoatrial junction. IMPRESSION: Successful placement of a right jugular non-tunneled dialysis catheter using ultrasound and fluoroscopic guidance. Electronically Signed   By: Markus Daft M.D.   On: 06/28/2022 08:19   DG Chest 2 View  Result Date: 06/27/2022 CLINICAL DATA:  Shortness of breath. EXAM: CHEST - 2 VIEW COMPARISON:  Chest x-ray 06/12/2022. FINDINGS: The heart size and mediastinal contours are within normal limits. Both lungs are clear. No visible pleural effusions or pneumothorax. No acute osseous abnormality. IMPRESSION: No evidence of acute cardiopulmonary disease. Electronically Signed   By: Margaretha Sheffield M.D.   On: 06/27/2022 10:45   VAS [place] ABI WITH/WO TBI  Result Date: 06/23/2022  LOWER EXTREMITY DOPPLER STUDY Patient Name:  Hashem Goynes [facility]  Date of Exam:   06/23/2022 Medical Rec #: [id number]              Accession #:    [id number] Date of Birth: [birth year]              Patient Gender: M Patient Age:   38 years  Exam Location:  [facility] Procedure:      VAS [place] ABI WITH/WO TBI Referring Phys: Lawson Radar --------------------------------------------------------------------------------  Indications: Pain and tingling of feet High Risk Factors: Hypertension. Other Factors: ESRD, neuropathy.  Comparison Study: No prior study Performing Technologist: Sharion Dove RVS  Examination Guidelines: A complete evaluation includes at minimum, Doppler waveform signals and systolic blood pressure reading at the level of bilateral brachial, anterior tibial, and posterior tibial arteries, when vessel segments are accessible. Bilateral testing is considered an integral part of a complete examination. Photoelectric Plethysmograph (PPG) waveforms and toe systolic pressure readings are included as required and additional duplex testing as needed. Limited examinations for reoccurring indications may be performed as noted.  ABI Findings: +---------+------------------+-----+-----------+--------+ Right    Rt Pressure (mmHg)IndexWaveform   Comment  +---------+------------------+-----+-----------+--------+ Brachial 137                    triphasic           +---------+------------------+-----+-----------+--------+ PTA      155               1.13 multiphasic         +---------+------------------+-----+-----------+--------+ DP       156               1.14 multiphasic         +---------+------------------+-----+-----------+--------+ Great Toe60                0.44                     +---------+------------------+-----+-----------+--------+ +---------+------------------+-----+-----------+---------+ Left     Lt Pressure (mmHg)IndexWaveform   Comment   +---------+------------------+-----+-----------+---------+ Brachial  Old graft +---------+------------------+-----+-----------+---------+ PTA      151               1.10 multiphasic           +---------+------------------+-----+-----------+---------+ DP       162               1.18 multiphasic          +---------+------------------+-----+-----------+---------+ Great Toe78                0.57                      +---------+------------------+-----+-----------+---------+ +-------+-----------+-----------+------------+------------+ ABI/TBIToday's ABIToday's TBIPrevious ABIPrevious TBI +-------+-----------+-----------+------------+------------+ Right  1.14       0.44                                +-------+-----------+-----------+------------+------------+ Left   1.18       0.57                                +-------+-----------+-----------+------------+------------+   Summary: Right: Resting right ankle-brachial index is within normal range. The right toe-brachial index is abnormal. Left: Resting left ankle-brachial index is within normal range. The left toe-brachial index is abnormal. *See table(s) above for measurements and observations.  Electronically signed by Monica Martinez MD on 06/23/2022 at 12:38:38 PM.    Final    VAS [place] LOWER EXTREMITY VENOUS (DVT)  Result Date: 06/23/2022  Lower Venous DVT Study Patient Name:  DAROL CUSH  Date of Exam:   06/23/2022 Medical Rec #: [id number]              Accession #:    [id number] Date of Birth: 1971/03/18              Patient Gender: M Patient Age:   26 years Exam Location:  [facility] Procedure:      VAS [place] LOWER EXTREMITY VENOUS (DVT) Referring Phys: Lawson Radar --------------------------------------------------------------------------------  Indications: Numbness and tingling of left foot.  Comparison Study: No prior left LEV Performing Technologist: Sharion Dove RVS  Examination Guidelines: A complete evaluation includes B-mode imaging, spectral Doppler, color Doppler, and power Doppler as needed of all accessible portions of each vessel. Bilateral testing is considered an integral part of a complete  examination. Limited examinations for reoccurring indications may be performed as noted. The reflux portion of the exam is performed with the patient in reverse Trendelenburg.  +-----+---------------+---------+-----------+----------+--------------+ RIGHTCompressibilityPhasicitySpontaneityPropertiesThrombus Aging +-----+---------------+---------+-----------+----------+--------------+ CFV  Full           Yes      Yes                                 +-----+---------------+---------+-----------+----------+--------------+ Incidental finding: Right lower extremity dialysis access is thrombosed  +---------+---------------+---------+-----------+----------+--------------+ LEFT     CompressibilityPhasicitySpontaneityPropertiesThrombus Aging +---------+---------------+---------+-----------+----------+--------------+ CFV      Full           Yes      Yes                                 +---------+---------------+---------+-----------+----------+--------------+ SFJ      Full                                                        +---------+---------------+---------+-----------+----------+--------------+  FV Prox  Full                                                        +---------+---------------+---------+-----------+----------+--------------+ FV Mid   Full                                                        +---------+---------------+---------+-----------+----------+--------------+ FV DistalFull                                                        +---------+---------------+---------+-----------+----------+--------------+ PFV      Full                                                        +---------+---------------+---------+-----------+----------+--------------+ POP      Full           Yes      Yes                                 +---------+---------------+---------+-----------+----------+--------------+ PTV      Full                                                         +---------+---------------+---------+-----------+----------+--------------+ PERO     Full                                                        +---------+---------------+---------+-----------+----------+--------------+     Summary: RIGHT: - No evidence of common femoral vein obstruction. 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  LEFT: - There is no evidence of deep vein thrombosis in the lower extremity.  *See table(s) above for measurements and observations. Electronically signed by Monica Martinez MD on 06/23/2022 at 12:38:24 PM.    Final    CT Head Wo Contrast  Result Date: 06/12/2022 CLINICAL DATA:  BLUNT POLY TRAUMA, DIZZINESS AND CHEST PAIN, HEADACHE FOR 2 WEEKS. EXAM: CT HEAD WITHOUT CONTRAST TECHNIQUE: Contiguous axial images were obtained from the base of the skull through the vertex without intravenous contrast. RADIATION DOSE REDUCTION: This exam was performed according to the departmental dose-optimization program which includes automated exposure control, adjustment of the mA and/or kV according to patient size and/or use of iterative reconstruction technique. COMPARISON:  HEAD CT 03/27/2023 FINDINGS: Brain: There is mild global atrophy including centrally but it is greater than typically seen in a 50 year old. The gray-white matter differentiation and attenuation are normal. No infarct, hemorrhage  or mass are seen. Ventricles are normal in size and position. Basal cisterns are clear. Vascular: There are scattered calcifications of the carotid siphons. There are no hyperdense central vessels. Skull: Negative for fracture or focal lesion. No scalp hematoma is seen. Sinuses/Orbits: No acute abnormality. Unremarkable orbital contents. 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 IMPRESSION:  No acute intracranial CT findings, depressed skull fractures or interval changes. Electronically Signed   By: Telford Nab M.D.   On: 06/12/2022 07:27   DG Chest Portable 1 View  Result Date: 06/12/2022 CLINICAL DATA:  Short of breath with dizziness. EXAM: PORTABLE CHEST 1 VIEW COMPARISON:  03/28/2022 FINDINGS: Heart size and mediastinal contours are unremarkable. There is no pleural effusion or edema identified. No airspace opacities identified. IMPRESSION: No active disease. Electronically Signed   By: Kerby Moors M.D.   On: 06/12/2022 06:41    Labs:  CBC: Recent Labs    06/28/22 0332 06/29/22 0135 06/29/22 2236 07/02/22 0948 07/02/22 0951 07/02/22 0952 07/04/22 1449  WBC 6.1 6.2  --  6.9  --   --  7.4  HGB 14.0 13.6   < > 16.2 17.3* 17.7* 13.0  HCT 39.7 40.6   < > 47.5 51.0 52.0 40.8  PLT 256 222  --  234  --   --  288   < > = values in this interval not displayed.    COAGS: Recent Labs    03/26/22 1755 06/29/22 0135 07/02/22 0948  INR 1.0 0.9 0.9  APTT 33  --  31    BMP: Recent Labs    06/29/22 0135 06/29/22 0839 06/30/22 0511 07/02/22 0948 07/02/22 0951 07/02/22 0952 07/04/22 1449  NA 135  --  136 134* 133* 133* 132*  K 6.5*   < > 4.2 4.4 4.1 4.1 7.2*  CL 94*  --  88* 87*  --  96* 89*  CO2 23  --  29 21*  --   --  14*  GLUCOSE 132*  --  134* 85  --  85 99  BUN 70*  --  46* 52*  --  50* 108*  CALCIUM 7.7*  --  8.2* 9.4  --   --  7.7*  CREATININE 15.13*  --  11.02* 12.18*  --  13.50* 20.00*  GFRNONAA 4*  --  5* 5*  --   --  3*   < > = values in this interval not displayed.    LIVER FUNCTION TESTS: Recent Labs    03/26/22 1755 06/27/22 1029 06/27/22 2023 06/30/22 0511 07/02/22 0948 07/04/22 1449  BILITOT 0.8 0.8  --   --  0.6 0.8  AST 23 13*  --   --  24 17  ALT 16 14  --   --  18 14  ALKPHOS 50 65  --   --  68 64  PROT 7.6 9.1*  --   --  9.7* 8.1  ALBUMIN 3.8 4.2 3.7 3.3* 4.5 4.0    TUMOR MARKERS: No results for input(s): "AFPTM",  "CEA", "CA199", "CHROMGRNA" in the last 8760 hours.  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  Patient well known to IR from prior declotting procedures.  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  Attempts were made to communicate a plan for declotting today in IR.  Patient arrived for his procedure but was not pre-medicated for his contrast allergy.  He was sent to the ED after discussion with Dr. Melvia Heaps for best management of his graft malfunction and failure to accomplish care plan as an outpatient.  Plan made to proceed with declot tomorrow at 12:00.  Pre-med has been scheduled as such.   Risks and benefits discussed with the patient including, but not limited to bleeding, infection, vascular injury, pneumothorax which may require chest tube placement, air embolism or even death   All of the patient's questions were answered, patient is agreeable to proceed. Consent signed and in chart.   Update 4:40:  Patient with elevated potassium at 7.2.  Per Nephrology he will need a emergent dialysis.  Unfortunately, patient has not been roomed in the ED.  No plans to bring to IR for temp cath at present.  Plan for temp cath in the AM if still needed vs. Proceeding with declot as scheduled tomorrow (12:00).   **Also note, discussed with Merle RN.  Patient schedule for CT Heart tomorrow.  Will need to coordinated with contrast pre-med if possible.  Dialysis will be the priority given his current labs.   Thank you for this interesting consult.  I greatly enjoyed meeting Mecca Abdou Cutting and look forward to participating in their care.  A copy of this report was sent to the requesting provider on this date.  Electronically Signed: Docia Barrier, PA 07/04/2022, 5:10 PM   I spent a total of 20 Minutes    in face to face in clinical consultation, greater than 50% of which was  counseling/coordinating care for clotted AVG.

## 2022-07-04 NOTE — ED Notes (Signed)
Potassium 7.2 notified to Charge, Therapist, sports.

## 2022-07-04 NOTE — Assessment & Plan Note (Signed)
Normal HD schedule MWF, last session on 07/01/22 due to above -critical hyperkalemia to 7.2, no ekg changes, asymptomatic -metabolic acidosis with AG -given lokelma, albuterol, novolog, calcium, sodium bicarb -nephrology consulted and recommended lokelma BID -nephrology and IR discussed and plan for de-clotting tomorrow -repeat bmp tonight -on progressive/telemetry -appreciate nephrology/IR

## 2022-07-04 NOTE — Consult Note (Signed)
Reason for Consult: ESRD Referring Physician:  Dr. Ashok Cordia  Chief Complaint: Thrombosed femoral graft  HD orders: [place] MWF 4 hrs 180NRe 450/autoflow 1.5 64.5 kg 2.0 K/ 2.5 Ca UFP 4 AVG -Heparin 1500 units IV TIW Not getting Mircera or VDRA  Assessment/Plan: Clotted AVG: No T/B rt thigh AVG; I advised having a temp cath placed and we could dialyze tonight but he refuses. He only wants a declot.  Hyperkalemia: K+ 7.2; he's refusing a catheter for dialysis; will medically treat and then dialyze emergently after the declot tomorrow. ESRD-MWF but will dialyze after the declot tomorrow; will hold a spot for him on 2nd shift. Hep B s Ag positive- Hep B isolation HTN: BP controlled on home medications,  no LE edema. UF as tolerated.  Anemia: no ESA needed BMM: no VDRA, binders when eating  HPI: [name] is an 51 y.o. male ESRD on HD MWF at [place], HTN, and multiple accesses who is now seen in consultation at the request of Dr. Ashok Cordia  for eval and recs re: provision of HD, management of ESRD, and management of hyperkalemia.  Pt was noted to have a clotted R thigh AVG which was just declotted recently by VIR on 9/27; he has a history of a contrast allergy and did not take his premeds. VIR planning on performing the declot tomorrow at noon.   He has not been eating/drinking very much in an effort to not get fluid overloaded.     Last full HD session was Friday and he left at his EDW  occurring without issue.    ROS Pertinent items are noted in HPI.  Chemistry and CBC: Creatinine, Ser  Date/Time Value Ref Range Status  07/04/2022 02:49 PM 20.00 (H) 0.61 - 1.24 mg/dL Final    Comment:    RESULTS VERIFIED BY REPEAT TESTING  07/02/2022 09:52 AM 13.50 (H) 0.61 - 1.24 mg/dL Final  07/02/2022 09:48 AM 12.18 (H) 0.61 - 1.24 mg/dL Final  06/30/2022 05:11 AM 11.02 (H) 0.61 - 1.24 mg/dL Final  06/29/2022 01:35 AM 15.13 (H) 0.61 - 1.24 mg/dL Final  06/28/2022 03:32 AM 13.53 (H) 0.61 - 1.24  mg/dL Final  06/27/2022 08:23 PM 24.66 (H) 0.61 - 1.24 mg/dL Final  06/27/2022 10:29 AM 23.45 (H) 0.61 - 1.24 mg/dL Final  06/24/2022 01:27 PM 15.20 (H) 0.61 - 1.24 mg/dL Final  06/11/2022 06:26 PM 12.04 (H) 0.61 - 1.24 mg/dL Final  03/28/2022 08:28 PM 9.08 (H) 0.61 - 1.24 mg/dL Final  03/26/2022 06:06 PM 13.60 (H) 0.61 - 1.24 mg/dL Final  03/26/2022 05:55 PM 12.35 (H) 0.61 - 1.24 mg/dL Final  11/13/2021 12:16 PM 10.34 (H) 0.61 - 1.24 mg/dL Final  07/11/2021 05:55 AM 13.97 (H) 0.61 - 1.24 mg/dL Final  07/09/2021 12:10 PM 16.90 (H) 0.61 - 1.24 mg/dL Final  05/27/2021 07:20 AM 17.84 (H) 0.61 - 1.24 mg/dL Final  05/27/2021 04:55 AM 17.64 (H) 0.61 - 1.24 mg/dL Final  05/27/2021 04:06 AM >18.00 (H) 0.61 - 1.24 mg/dL Final  05/27/2021 03:47 AM 17.96 (H) 0.61 - 1.24 mg/dL Final  02/19/2021 08:41 AM 12.51 (H) 0.61 - 1.24 mg/dL Final  02/17/2021 05:12 AM 11.66 (H) 0.61 - 1.24 mg/dL Final  02/16/2021 03:33 AM 17.01 (H) 0.61 - 1.24 mg/dL Final  02/14/2021 08:22 PM 13.73 (H) 0.61 - 1.24 mg/dL Final  08/18/2020 10:50 AM 10.08 (H) 0.61 - 1.24 mg/dL Final  02/20/2020 01:05 AM 9.19 (H) 0.61 - 1.24 mg/dL Final  01/31/2020 11:51 AM 10.43 (H) 0.61 -  1.24 mg/dL Final  01/13/2020 04:41 PM 8.98 (H) 0.61 - 1.24 mg/dL Final  12/10/2019 07:36 AM 12.33 (H) 0.61 - 1.24 mg/dL Final  09/17/2019 11:11 AM 12.74 (H) 0.61 - 1.24 mg/dL Final  05/14/2019 09:02 AM 11.83 (H) 0.61 - 1.24 mg/dL Final  02/06/2019 07:00 AM 15.71 (H) 0.61 - 1.24 mg/dL Final  10/15/2018 11:04 PM 10.84 (H) 0.61 - 1.24 mg/dL Final  08/15/2018 12:11 PM 13.35 (H) 0.61 - 1.24 mg/dL Final  06/20/2018 08:57 AM 15.93 (H) 0.61 - 1.24 mg/dL Final  06/19/2018 10:38 AM 13.80 (H) 0.61 - 1.24 mg/dL Final  06/18/2018 02:38 PM 20.29 (H) 0.61 - 1.24 mg/dL Final  06/17/2018 11:47 AM 17.37 (H) 0.61 - 1.24 mg/dL Final  06/16/2018 09:26 AM 25.42 (H) 0.61 - 1.24 mg/dL Final    Comment:    RESULTS CONFIRMED BY MANUAL DILUTION  06/15/2018 06:52 AM 23.51 (H) 0.61  - 1.24 mg/dL Final  06/14/2018 09:16 PM 22.29 (H) 0.61 - 1.24 mg/dL Final  06/14/2018 07:57 PM >18.00 (H) 0.61 - 1.24 mg/dL Final  08/09/2017 11:41 PM 17.09 (H) 0.61 - 1.24 mg/dL Final  01/10/2017 11:22 AM 13.39 (H) 0.61 - 1.24 mg/dL Final  12/27/2016 10:46 AM 12.30 (H) 0.61 - 1.24 mg/dL Final  10/15/2016 12:12 PM 12.21 (H) 0.61 - 1.24 mg/dL Final  10/08/2016 05:57 AM 16.46 (H) 0.61 - 1.24 mg/dL Final  10/07/2016 05:55 PM 15.07 (H) 0.61 - 1.24 mg/dL Final  07/15/2016 08:03 AM 15.10 (H) 0.61 - 1.24 mg/dL Final  06/01/2016 09:56 AM 16.49 (H) 0.61 - 1.24 mg/dL Final  05/05/2016 08:35 AM >18.00 (H) 0.61 - 1.24 mg/dL Final  03/12/2016 10:49 AM 14.60 (H) 0.61 - 1.24 mg/dL Final   Recent Labs  Lab 06/27/22 2023 06/28/22 0332 06/29/22 0135 06/29/22 0839 06/30/22 0511 07/02/22 0948 07/02/22 0951 07/02/22 0952 07/04/22 1449  NA 138 135 135  --  136 134* 133* 133* 132*  K 6.1* 4.3 6.5* 4.9 4.2 4.4 4.1 4.1 7.2*  CL 90* 89* 94*  --  88* 87*  --  96* 89*  CO2 18* 27 23  --  29 21*  --   --  14*  GLUCOSE 110* 203* 132*  --  134* 85  --  85 99  BUN 124* 47* 70*  --  46* 52*  --  50* 108*  CREATININE 24.66* 13.53* 15.13*  --  11.02* 12.18*  --  13.50* 20.00*  CALCIUM 8.2* 8.8* 7.7*  --  8.2* 9.4  --   --  7.7*  PHOS 6.8*  --   --   --  6.9*  --   --   --   --    Recent Labs  Lab 06/28/22 0332 06/29/22 0135 06/29/22 2236 07/02/22 0948 07/02/22 0951 07/02/22 0952 07/04/22 1449  WBC 6.1 6.2  --  6.9  --   --  7.4  NEUTROABS  --   --   --  5.1  --   --  6.7  HGB 14.0 13.6   < > 16.2 17.3* 17.7* 13.0  HCT 39.7 40.6   < > 47.5 51.0 52.0 40.8  MCV 89.6 93.1  --  91.9  --   --  97.1  PLT 256 222  --  234  --   --  288   < > = values in this interval not displayed.   Liver Function Tests: Recent Labs  Lab 06/30/22 0511 07/02/22 0948 07/04/22 1449  AST  --  24 17  ALT  --  18 14  ALKPHOS  --  68 64  BILITOT  --  0.6 0.8  PROT  --  9.7* 8.1  ALBUMIN 3.3* 4.5 4.0   No results for  input(s): "LIPASE", "AMYLASE" in the last 168 hours. No results for input(s): "AMMONIA" in the last 168 hours. Cardiac Enzymes: No results for input(s): "CKTOTAL", "CKMB", "CKMBINDEX", "TROPONINI" in the last 168 hours. Iron Studies: No results for input(s): "IRON", "TIBC", "TRANSFERRIN", "FERRITIN" in the last 72 hours. PT/INR: @LABRCNTIP (inr:5)  Xrays/Other Studies: ) Results for orders placed or performed during the hospital encounter of 07/04/22 (from the past 48 hour(s))  Comprehensive metabolic panel     Status: Abnormal   Collection Time: 07/04/22  2:49 PM  Result Value Ref Range   Sodium 132 (L) 135 - 145 mmol/L   Potassium 7.2 (HH) 3.5 - 5.1 mmol/L    Comment: REPEATED TO VERIFY CRITICAL RESULT CALLED TO, READ BACK BY AND VERIFIED WITH: COSTALES,A RN @ 1604 07/04/22 LEONARD,A    Chloride 89 (L) 98 - 111 mmol/L   CO2 14 (L) 22 - 32 mmol/L   Glucose, Bld 99 70 - 99 mg/dL    Comment: Glucose reference range applies only to samples taken after fasting for at least 8 hours.   BUN 108 (H) 6 - 20 mg/dL   Creatinine, Ser 20.00 (H) 0.61 - 1.24 mg/dL    Comment: RESULTS VERIFIED BY REPEAT TESTING   Calcium 7.7 (L) 8.9 - 10.3 mg/dL   Total Protein 8.1 6.5 - 8.1 g/dL   Albumin 4.0 3.5 - 5.0 g/dL   AST 17 15 - 41 U/L   ALT 14 0 - 44 U/L   Alkaline Phosphatase 64 38 - 126 U/L   Total Bilirubin 0.8 0.3 - 1.2 mg/dL   GFR, Estimated 3 (L) >60 mL/min    Comment: (NOTE) Calculated using the CKD-EPI Creatinine Equation (2021)    Anion gap 29 (H) 5 - 15    Comment: Electrolytes repeated to confirm. Performed at [facility], [place] [address], [place], [place] [address]   CBC with Differential     Status: Abnormal   Collection Time: 07/04/22  2:49 PM  Result Value Ref Range   WBC 7.4 4.0 - 10.5 K/uL   RBC 4.20 (L) 4.22 - 5.81 MIL/uL   Hemoglobin 13.0 13.0 - 17.0 g/dL   HCT 40.8 39.0 - 52.0 %   MCV 97.1 80.0 - 100.0 fL   MCH 31.0 26.0 - 34.0 pg   MCHC 31.9 30.0 - 36.0 g/dL    RDW 14.8 11.5 - 15.5 %   Platelets 288 150 - 400 K/uL   nRBC 0.0 0.0 - 0.2 %   Neutrophils Relative % 91 %   Neutro Abs 6.7 1.7 - 7.7 K/uL   Lymphocytes Relative 7 %   Lymphs Abs 0.5 (L) 0.7 - 4.0 K/uL   Monocytes Relative 2 %   Monocytes Absolute 0.1 0.1 - 1.0 K/uL   Eosinophils Relative 0 %   Eosinophils Absolute 0.0 0.0 - 0.5 K/uL   Basophils Relative 0 %   Basophils Absolute 0.0 0.0 - 0.1 K/uL   Immature Granulocytes 0 %   Abs Immature Granulocytes 0.02 0.00 - 0.07 K/uL    Comment: Performed at [facility] [address] [place], [place] [address]  CBG monitoring, ED     Status: Abnormal   Collection Time: 07/04/22  7:30 PM  Result Value Ref Range  Glucose-Capillary 194 (H) 70 - 99 mg/dL    Comment: Glucose reference range applies only to samples taken after fasting for at least 8 hours.   No results found.  PMH:   Past Medical History:  Diagnosis Date   Anemia    Anxiety    Depression    ESRD on hemodialysis ([place])    HD [place] MWF   Hypertension    Thyroid disease     PSH:   Past Surgical History:  Procedure Laterality Date   ARTERIOVENOUS GRAFT PLACEMENT Left    "forearm, it's not working; thigh"    ARTERIOVENOUS GRAFT PLACEMENT Left 11/09/2015   AV FISTULA PLACEMENT Right 01/10/2017   Procedure: INSERTION OF ARTERIOVENOUS Right thigh GORE-TEX GRAFT;  Surgeon: [name], MD;  Location: [facility];  Service: Vascular;  Laterality: Right;   FALSE ANEURYSM REPAIR Left 11/09/2015   Procedure: REPAIR OF LEFT FEMORAL PSEUDOANEURYSM; REVISION  OF LEFT THIGH ARTERIOVENOUS GRAFT USING 6MM X 10 CM GORETEX GRAFT ;  Surgeon: [name], MD;  Location: [facility] OR;  Service: Vascular;  Laterality: Left;   IR AV DIALY SHUNT INTRO NEEDLE/INTRACATH INITIAL W/PTA/IMG RIGHT Right 02/06/2019   IR AV DIALY SHUNT INTRO NEEDLE/INTRACATH INITIAL W/PTA/IMG RIGHT Right 05/14/2019   IR AV DIALY SHUNT INTRO NEEDLE/INTRACATH INITIAL W/PTA/IMG RIGHT Right 08/18/2020   IR AV DIALY  SHUNT INTRO NEEDLE/INTRACATH INITIAL W/PTA/IMG RIGHT Right 04/21/2022   IR DIALY SHUNT INTRO NEEDLE/INTRACATH INITIAL W/IMG RIGHT Right 09/17/2019   IR DIALY SHUNT INTRO NEEDLE/INTRACATH INITIAL W/IMG RIGHT Right 12/10/2019   IR FLUORO GUIDE CV LINE RIGHT  07/10/2021   IR FLUORO GUIDE CV LINE RIGHT  06/27/2022   IR GENERIC HISTORICAL  07/15/2016   IR US GUIDE VASC ACCESS LEFT 07/15/2016 [name], MD [facility]   IR GENERIC HISTORICAL Left 07/15/2016   IR THROMBECTOMY AV FISTULA W/THROMBOLYSIS/PTA INC/SHUNT/IMG LEFT 07/15/2016 [name], MD [facility]   IR GENERIC HISTORICAL  10/05/2016   IR US GUIDE VASC ACCESS LEFT 10/05/2016 [name], MD [facility]   IR GENERIC HISTORICAL Left 10/05/2016   IR THROMBECTOMY AV FISTULA W/THROMBOLYSIS/PTA INC/SHUNT/IMG LEFT 10/05/2016 [name], MD [facility]   IR GENERIC HISTORICAL  10/08/2016   IR FLUORO GUIDE CV LINE RIGHT 10/08/2016 [name], MD [facility]   IR GENERIC HISTORICAL  10/08/2016   IR US GUIDE VASC ACCESS RIGHT 10/08/2016 [name], MD [facility]   IR PTA AND STENT ADDL CENTRAL DIALY SEG THRU DIALY CIRCUIT RIGHT Right 12/10/2019   IR RADIOLOGIST EVAL & MGMT  11/26/2019   IR REMOVAL TUN CV CATH W/O FL  03/16/2017   IR REMOVAL TUN CV CATH W/O FL  07/22/2021   IR THROMBECTOMY AV FISTULA W/THROMBOLYSIS/PTA INC/SHUNT/IMG RIGHT Right 06/16/2018   IR THROMBECTOMY AV FISTULA W/THROMBOLYSIS/PTA INC/SHUNT/IMG RIGHT Right 06/17/2018   IR THROMBECTOMY AV FISTULA W/THROMBOLYSIS/PTA INC/SHUNT/IMG RIGHT Right 07/15/2021   IR THROMBECTOMY AV FISTULA W/THROMBOLYSIS/PTA INC/SHUNT/IMG RIGHT Right 06/29/2022   IR US GUIDE VASC ACCESS RIGHT  06/17/2018   IR US GUIDE VASC ACCESS RIGHT  06/16/2018   IR US GUIDE VASC ACCESS RIGHT  02/06/2019   IR US GUIDE VASC ACCESS RIGHT  05/14/2019   IR US GUIDE VASC ACCESS RIGHT  09/17/2019   IR US GUIDE VASC ACCESS RIGHT  12/10/2019   IR US GUIDE VASC ACCESS RIGHT  08/18/2020   IR US GUIDE VASC ACCESS RIGHT  07/10/2021    IR US GUIDE [place] RIGHT  07/19/2021   IR  US GUIDE VASC ACCESS RIGHT  04/21/2022   IR US GUIDE VASC ACCESS RIGHT  06/27/2022   IR US GUIDE VASC ACCESS RIGHT  06/29/2022   PARATHYROIDECTOMY N/A 04/24/2014   Procedure: TOTAL 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;  Surgeon: [name], MD;  Location: [facility];  Service: General;  Laterality: N/A;  NECK AND LEFT FOREARM   PERIPHERAL VASCULAR CATHETERIZATION N/A 05/05/2016   Procedure: A/V Shuntogram;  Surgeon: Conrad [place], MD;  Location: [facility] CV LAB;  Service: Cardiovascular;  Laterality: N/A;   PERIPHERAL VASCULAR CATHETERIZATION Left 05/05/2016   Procedure: Peripheral Vascular Balloon Angioplasty;  Surgeon: Conrad [place], MD;  Location: [facility] [facility] CV LAB;  Service: Cardiovascular;  Laterality: Left;   PSEUDOANEURYSM REPAIR Left 11/09/2015   TEE WITHOUT CARDIOVERSION N/A 02/19/2021   Procedure: TRANSESOPHAGEAL ECHOCARDIOGRAM (TEE);  Surgeon: [name], MD;  Location: [facility] ENDOSCOPY;  Service: Cardiovascular;  Laterality: N/A;   THROMBECTOMY / ARTERIOVENOUS GRAFT REVISION Left 08/18/2015   thigh   THROMBECTOMY AND REVISION OF ARTERIOVENTOUS (AV) GORETEX  GRAFT Left 08/23/2015   Procedure: THROMBECTOMY AND REVISION OF ARTERIOVENTOUS (AV) GORETEX  GRAFT LEFT THIGH ;  Surgeon: [name], MD;  Location: [place];  Service: Vascular;  Laterality: Left;   THROMBECTOMY W/ EMBOLECTOMY Left 08/18/2015   Procedure: THROMBECTOMY  AND REVISION ARTERIOVENOUS GORE-TEX GRAFT/LEFT THIGH;  Surgeon: [name], MD;  Location: [facility];  Service: Vascular;  Laterality: Left;   THROMBECTOMY W/ EMBOLECTOMY Right 06/19/2018   Procedure: THROMBECTOMY AND REVISION OF RIGHT THIGH  ARTERIOVENOUS GORE-TEX GRAFT;  Surgeon: [name], MD;  Location: [facility] OR;  Service: Vascular;  Laterality: Right;   UPPER EXTREMITY VENOGRAPHY Bilateral 12/27/2016   Procedure: Upper Extremity Venography;  Surgeon: [name], MD;  Location: [facility] CV LAB;  Service: Cardiovascular;  Laterality: Bilateral;   VENOGRAM Left 05/05/2016   Procedure: Venogram;  Surgeon: Conrad [place], MD;  Location: [facility] CV LAB;  Service: Cardiovascular;  Laterality: Left;  lower extremity    Allergies:  Allergies  Allergen Reactions   Ivp Dye [Iodinated Contrast Media] Swelling    SWELLING REACTION UNSPECIFIED    Lisinopril Cough   Solu-Medrol [Methylprednisolone] Nausea And Vomiting    Medications:   Prior to Admission medications   Medication Sig Start Date End Date Taking? Authorizing Provider  acetaminophen (TYLENOL) 500 MG tablet Take 1,000 mg by mouth daily as needed (for pain or headaches).    Yes [provider]  B Complex-C-Folic Acid (DIALYVITE TABLET) TABS Take 1 tablet by mouth daily. 06/29/20  Yes [provider]  predniSONE (DELTASONE) 50 MG tablet One tablet every 6 hours starting Sunday (10/1) at 2300 (11pm) for a total of 3 doses 07/03/22  Yes Karmen Bongo, MD  TUMS ULTRA 1000 1000 MG chewable tablet Chew 4,000 mg by mouth 3 (three) times daily with meals. 03/14/20  Yes [provider]  fluticasone (FLONASE) 50 MCG/ACT nasal spray Place 2 sprays into both nostrils daily. Patient not taking: Reported on 06/15/2018 07/25/17 02/20/20  Larene Pickett, PA-C    Discontinued Meds:   Medications Discontinued During This Encounter  Medication Reason   metoprolol tartrate (LOPRESSOR) tablet 100 mg    insulin aspart (novoLOG) injection 8 Units    sodium bicarbonate injection 50 mEq    dextrose 50 % solution 25 g     Social History:  reports that he quit smoking about 36 years ago. His smoking use included cigarettes. He has  never used smokeless tobacco. He reports that he does not drink alcohol and does not use drugs.  Family History:   Family History  Problem Relation Age of Onset   Renal Disease Neg Hx     Blood pressure (!) 142/93, pulse 72, temperature 98.2 F (36.8 C), temperature source  Oral, resp. rate 15, height 6' (1.829 m), weight 65.8 kg, SpO2 100 %. General: Pleasant, NAD Heart: RRR Lungs: No WOB. CTAB Abdomen:NABs, NT, ND Extremities: No LE edema Dialysis Access: Right thigh AVG no bruit or thrill Neuro: A&Ox3, pleasant, cooperative Back: No flank tenderness       Keisi Eckford, Hunt Oris, MD 07/04/2022, 7:46 PM

## 2022-07-04 NOTE — ED Notes (Signed)
RN called phlebotomy for lab work

## 2022-07-04 NOTE — ED Provider Triage Note (Signed)
Emergency Medicine Provider Triage Evaluation Note  [name] , a 51 y.o. male  was evaluated in triage.  Pt complains of vascular access problem.  Patient states that he received dialysis this past Friday and since then, his graft has clotted.  He reports history of this occurring.  He told he sent here by radiology.  Denies fever, chills or night sweats, chest [name], shortness of breath, [name], nausea, vomiting.  Received dialysis Monday Wednesday Friday typically..  Review of Systems  Positive: See above Negative:   Physical Exam  BP (!) 145/87 (BP Location: Right Arm)   Pulse 74   Temp 98.5 F (36.9 C) (Oral)   Resp 18   Ht 6' (1.829 m)   Wt 65.8 kg   SpO2 98%   BMI 19.67 kg/m  Gen:   Awake, no distress   Resp:  Normal effort  MSK:   Moves extremities without difficulty  Other:  Right lower extremity graft without palpable thrill or bruit  Medical Decision Making  Medically screening exam initiated at 2:37 PM.  Appropriate orders placed.  [name] was informed that the remainder of the evaluation will be completed by another provider, this initial triage assessment does not replace that evaluation, and the importance of remaining in the ED until their evaluation is complete.     [name], [place] 07/04/22 1439

## 2022-07-04 NOTE — Progress Notes (Signed)
Interventional Radiology Brief Note:  Patient presented to [facility] today at 12 noon. Unfortunately, this is his scheduled procedure time, not arrival time.  He also was unable to get his contrast pre-medication over the weekend.  He does not have a ride home arranged today.  He is under the impression he will be getting dialysis at the hospital today.    Discussed with Dr. Melvia Heaps.  Plan made to send patient to the ED for overnight observation as well as pre-medication.  +/- dialysis tomorrow after declotting procedure.  Patient strongly prefers declot to HD catheter placement, although he understands that an HD catheter may be his only option if thigh graft not amenable to further intervention (3rd declotting procedure since 04/2022).    All was discussed with the patient who is agreeable.  Brynda Greathouse, MS RD PA-C

## 2022-07-04 NOTE — ED Triage Notes (Signed)
Patient seen here last week for same with graft clogged.  Reported he had dialysis last Friday and his BP dropped and now his graft doesn't have bruit or thrill.  Patient is suppose to have dialysis today.

## 2022-07-04 NOTE — H&P (Signed)
History and Physical    Patient: [name] MVH:[id number] DOB: 1970-12-23 DOA: 07/04/2022 DOS: the patient was seen and examined on 07/04/2022 PCP: Patient, No Pcp Per  Patient coming from: Home - lives with with a roommate.    Chief Complaint: hyperkalemia   HPI: [name] is a 51 y.o. 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 Last dialysis last Friday. He had a declotting procedure done on 06/28/22. His graft was successfully restored; however, over the weekend his graft was again found without bruit/thrill and unable to access. He had had one dialysis session since 06/28/22. He came to radiology today for declotting, but he was unprepared for procedure with contrast and sedation. IR discussed wit nephrology and plans are to observe overnight, pre-medicate and proceed with de-clotting procedure tomorrow vs. Tunneled HD catheter. Also due for CT coronary tomorrow.    He has been feeling good. Denies any fever/chills, vision changes/headaches, chest pain or palpitations, shortness of breath or cough, abdominal pain, N/V/D, dysuria or leg swelling.    He does not smoke or drink.   ER Course:  vitals: afebrile, bp: 145/87, HR: 74, RR: 18, oxygen: 98%RA Pertinent labs: potassium: 7.2, sodium: 132, CO2:14, BUN: 108, creatinine: 20, calcium: 7.7, AG: 29 In ED: nephrology and IR consulted. Plan for temp HD placement.  Lokelma,sodium bicarb, insulin and albuterol given.   Review of Systems: As mentioned in the history of present illness. All other systems reviewed and are negative. Past Medical History:  Diagnosis Date   Anemia    Anxiety    Depression    ESRD on hemodialysis ([place])    HD [facility] MWF   Hypertension    Thyroid disease    Past Surgical History:  Procedure Laterality Date   ARTERIOVENOUS  GRAFT PLACEMENT Left    "forearm, it's not working; thigh"    ARTERIOVENOUS GRAFT PLACEMENT Left 11/09/2015   AV FISTULA PLACEMENT Right 01/10/2017   Procedure: INSERTION OF ARTERIOVENOUS Right thigh GORE-TEX GRAFT;  Surgeon: [name], MD;  Location: [place];  Service: Vascular;  Laterality: Right;   FALSE ANEURYSM REPAIR Left 11/09/2015   Procedure: REPAIR OF LEFT FEMORAL PSEUDOANEURYSM; REVISION  OF LEFT THIGH ARTERIOVENOUS GRAFT USING 6MM X 10 CM GORETEX GRAFT ;  Surgeon: [name], MD;  Location: [place];  Service: Vascular;  Laterality: Left;   IR AV DIALY SHUNT INTRO NEEDLE/INTRACATH INITIAL W/PTA/IMG RIGHT Right 02/06/2019   IR AV DIALY SHUNT INTRO NEEDLE/INTRACATH INITIAL W/PTA/IMG RIGHT Right 05/14/2019   IR AV DIALY SHUNT INTRO NEEDLE/INTRACATH INITIAL W/PTA/IMG RIGHT Right 08/18/2020   IR AV DIALY SHUNT INTRO NEEDLE/INTRACATH INITIAL W/PTA/IMG RIGHT Right 04/21/2022   IR DIALY SHUNT INTRO NEEDLE/INTRACATH INITIAL W/IMG RIGHT Right 09/17/2019   IR DIALY SHUNT INTRO NEEDLE/INTRACATH INITIAL W/IMG RIGHT Right 12/10/2019   IR FLUORO GUIDE CV LINE RIGHT  07/10/2021   IR FLUORO GUIDE CV LINE RIGHT  06/27/2022   IR GENERIC HISTORICAL  07/15/2016   IR US GUIDE VASC ACCESS LEFT 07/15/2016 [name], MD [facility]   IR GENERIC HISTORICAL Left 07/15/2016   IR THROMBECTOMY AV FISTULA W/THROMBOLYSIS/PTA INC/SHUNT/IMG LEFT 07/15/2016 [name], MD [facility]   IR GENERIC HISTORICAL  10/05/2016   IR US GUIDE VASC ACCESS LEFT 10/05/2016 [name], MD [facility]   IR GENERIC HISTORICAL Left 10/05/2016   IR THROMBECTOMY AV  FISTULA W/THROMBOLYSIS/PTA INC/SHUNT/IMG LEFT 10/05/2016 [name], MD [facility]   IR GENERIC HISTORICAL  10/08/2016   IR FLUORO GUIDE CV LINE RIGHT 10/08/2016 [name], MD [facility]   IR GENERIC HISTORICAL  10/08/2016   IR US GUIDE VASC ACCESS RIGHT 10/08/2016 [name], MD [facility]   IR PTA AND STENT ADDL CENTRAL DIALY SEG THRU DIALY CIRCUIT RIGHT Right  12/10/2019   IR RADIOLOGIST EVAL & MGMT  11/26/2019   IR REMOVAL TUN CV CATH W/O FL  03/16/2017   IR REMOVAL TUN CV CATH W/O FL  07/22/2021   IR THROMBECTOMY AV FISTULA W/THROMBOLYSIS/PTA INC/SHUNT/IMG RIGHT Right 06/16/2018   IR THROMBECTOMY AV FISTULA W/THROMBOLYSIS/PTA INC/SHUNT/IMG RIGHT Right 06/17/2018   IR THROMBECTOMY AV FISTULA W/THROMBOLYSIS/PTA INC/SHUNT/IMG RIGHT Right 07/15/2021   IR THROMBECTOMY AV FISTULA W/THROMBOLYSIS/PTA INC/SHUNT/IMG RIGHT Right 06/29/2022   IR US GUIDE VASC ACCESS RIGHT  06/17/2018   IR US GUIDE VASC ACCESS RIGHT  06/16/2018   IR US GUIDE VASC ACCESS RIGHT  02/06/2019   IR US GUIDE VASC ACCESS RIGHT  05/14/2019   IR US GUIDE VASC ACCESS RIGHT  09/17/2019   IR US GUIDE VASC ACCESS RIGHT  12/10/2019   IR US GUIDE VASC ACCESS RIGHT  08/18/2020   IR US GUIDE VASC ACCESS RIGHT  07/10/2021   IR US GUIDE VASC ACCESS RIGHT  07/19/2021   IR US GUIDE VASC ACCESS RIGHT  04/21/2022   IR US GUIDE VASC ACCESS RIGHT  06/27/2022   IR US GUIDE VASC ACCESS RIGHT  06/29/2022   PARATHYROIDECTOMY N/A 04/24/2014   Procedure: TOTAL 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;  Surgeon: [name], MD;  Location: [place];  Service: General;  Laterality: N/A;  NECK AND LEFT FOREARM   PERIPHERAL VASCULAR CATHETERIZATION N/A 05/05/2016   Procedure: A/V Shuntogram;  Surgeon: Conrad [place], MD;  Location: [place] [facility];  Service: Cardiovascular;  Laterality: N/A;   PERIPHERAL VASCULAR CATHETERIZATION Left 05/05/2016   Procedure: Peripheral Vascular Balloon Angioplasty;  Surgeon: Conrad [name], MD;  Location: [place] [facility];  Service: Cardiovascular;  Laterality: Left;   PSEUDOANEURYSM REPAIR Left 11/09/2015   TEE WITHOUT CARDIOVERSION N/A 02/19/2021   Procedure: TRANSESOPHAGEAL ECHOCARDIOGRAM (TEE);  Surgeon: [name], MD;  Location: [facility]OS[facility];  Service: Cardiovascular;  Laterality: N/A;   THROMBECTOMY / ARTERIOVENOUS GRAFT REVISION Left 08/18/2015   thigh   THROMBECTOMY AND  REVISION OF ARTERIOVENTOUS (AV) GORETEX  GRAFT Left 08/23/2015   Procedure: THROMBECTOMY AND REVISION OF ARTERIOVENTOUS (AV) GORETEX  GRAFT LEFT THIGH ;  Surgeon: [name], MD;  Location: [place];  Service: Vascular;  Laterality: Left;   THROMBECTOMY W/ EMBOLECTOMY Left 08/18/2015   Procedure: THROMBECTOMY  AND REVISION ARTERIOVENOUS GORE-TEX GRAFT/LEFT THIGH;  Surgeon: [name], MD;  Location: [place];  Service: Vascular;  Laterality: Left;   THROMBECTOMY W/ EMBOLECTOMY Right 06/19/2018   Procedure: THROMBECTOMY AND REVISION OF RIGHT THIGH  ARTERIOVENOUS GORE-TEX GRAFT;  Surgeon: [name], MD;  Location: [facility] OR;  Service: Vascular;  Laterality: Right;   UPPER EXTREMITY VENOGRAPHY Bilateral 12/27/2016   Procedure: Upper Extremity Venography;  Surgeon: [name], MD;  Location: [place] [facility];  Service: Cardiovascular;  Laterality: Bilateral;   VENOGRAM Left 05/05/2016   Procedure: Venogram;  Surgeon: Conrad [place], MD;  Location: [place] [facility];  Service: Cardiovascular;  Laterality: Left;  lower extremity   Social History:  reports that he quit smoking about 36 years ago. His smoking  use included cigarettes. He has never used smokeless tobacco. He reports that he does not drink alcohol and does not use drugs.  Allergies  Allergen Reactions   Ivp Dye [Iodinated Contrast Media] Swelling    SWELLING REACTION UNSPECIFIED    Lisinopril Cough   Solu-Medrol [Methylprednisolone] Nausea And Vomiting    Family History  Problem Relation Age of Onset   Renal Disease Neg Hx     Prior to Admission medications   Medication Sig Start Date End Date Taking? Authorizing Provider  acetaminophen (TYLENOL) 500 MG tablet Take 1,000 mg by mouth daily as needed (for pain or headaches).     [provider]  B Complex-C-Folic Acid (DIALYVITE TABLET) TABS Take 1 tablet by mouth daily. 06/29/20   [provider]  predniSONE (DELTASONE) 50 MG tablet One tablet  every 6 hours starting Sunday (10/1) at 2300 (11pm) for a total of 3 doses 07/03/22   Karmen Bongo, MD  TUMS ULTRA 1000 1000 MG chewable tablet Chew 4,000 mg by mouth 3 (three) times daily with meals. 03/14/20   [provider]  fluticasone (FLONASE) 50 MCG/ACT nasal spray Place 2 sprays into both nostrils daily. Patient not taking: Reported on 06/15/2018 07/25/17 02/20/20  Larene Pickett, PA-C    Physical Exam: Vitals:   07/04/22 1715 07/04/22 1730 07/04/22 1745 07/04/22 1800  BP: (!) 136/91 (!) 141/86 (!) 141/89 (!) 142/93  Pulse: 66 67 70 72  Resp: (!) 22 20 20 15   Temp:      TempSrc:      SpO2: 100% 100% 100% 100%  Weight:      Height:       General:  Appears calm and comfortable and is in NAD Eyes:  PERRL, EOMI, normal lids, iris ENT:  grossly normal hearing, lips & tongue, mmm; appropriate dentition Neck:  no LAD, masses or thyromegaly; no carotid bruits Cardiovascular:  RRR, no m/r/g. No LE edema.  Respiratory:   CTA bilaterally with no wheezes/rales/rhonchi.  Normal respiratory effort. Abdomen:  soft, NT, ND, NABS Back:   normal alignment, no CVAT Skin:  no rash or induration seen on limited exam. AV fistula in right thigh, no thrill  Musculoskeletal:  grossly normal tone BUE/BLE, good ROM, no bony abnormality Lower extremity:  No LE edema.  Limited foot exam with no ulcerations.  2+ distal pulses. Psychiatric:  grossly normal mood and affect, speech fluent and appropriate, AOx3 Neurologic:  CN 2-12 grossly intact, moves all extremities in coordinated fashion, sensation intact   Radiological Exams on Admission: Independently reviewed - see discussion in A/P where applicable  No results found.  EKG: Independently reviewed.  NSR with rate 70; nonspecific ST changes with no evidence of acute ischemia   Labs on Admission: I have personally reviewed the available labs and imaging studies at the time of the admission.  Pertinent labs:   potassium: 7.2,  sodium:  132,  CO2:14,  BUN: 108,  creatinine: 20,  calcium: 7.7,  AG: 29  Assessment and Plan: Principal Problem:   Clotted dialysis access [facility]) Active Problems:   ESRD (end stage renal disease) on dialysis with hyperkalemia, metabolic acidosis    Prolonged QT interval   Essential hypertension   Hyperkalemia   Metabolic acidosis    Assessment and Plan: * Clotted dialysis access [facility]) 51 year old presenting with hyperkalemia due to missed dialysis secondary to clotted graft. 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 De-clotting procedure  done successfully on 06/28/22 but only able to complete 1 dialysis session before graft had no thrill and no access and clotted again.  -IR/nephrology consulted, needs dialysis but no access -specialists discussed will pre-medicate and observe overnight with plans to proceed with de-clotting procedure tomorrow vs. Tunneled HD catheter   ESRD (end stage renal disease) on dialysis with hyperkalemia, metabolic acidosis  Normal HD schedule MWF, last session on 07/01/22 due to above -critical hyperkalemia to 7.2, no ekg changes, asymptomatic -metabolic acidosis with AG -given lokelma, albuterol, novolog, calcium, sodium bicarb -nephrology consulted and recommended lokelma BID -nephrology and IR discussed and plan for de-clotting tomorrow -repeat bmp tonight -on progressive/telemetry -appreciate nephrology/IR   Prolonged QT interval Optimize electrolytes Keep on telemetry Avoid qt prolonging drugs  Repeat ekg in AM    Essential hypertension Post HD hypotension, all hypertensive stopped recently Continue to monitor      Advance Care Planning:   Code Status: Full Code   Consults: nephrology/IR   DVT Prophylaxis: heparin   Family Communication: none   Severity of Illness: The appropriate patient status for this patient is OBSERVATION. Observation status is judged to be  reasonable and necessary in order to provide the required intensity of service to ensure the patient's safety. The patient's presenting symptoms, physical exam findings, and initial radiographic and laboratory data in the context of their medical condition is felt to place them at decreased risk for further clinical deterioration. Furthermore, it is anticipated that the patient will be medically stable for discharge from the hospital within 2 midnights of admission.   Author: Orma Flaming, MD 07/04/2022 7:50 PM  For on call review [email or web address].

## 2022-07-04 NOTE — Consult Note (Addendum)
[facility] Renal Consultation Note    Indication for Consultation:  Management of ESRD/hemodialysis; anemia, hypertension/volume and secondary hyperparathyroidism  PJK:DTOIZTI, No Pcp Per  HPI: [name] is a 51 y.o. male with ESRD on HD MWF at [facility].  His past medical history significant for HTN, Hep B (+), and parathyroidectomy in July 2015.  Patient recently discharged 9/30 (OBV status) from [facility] for weakness and hypotension. His BP medications were held for post-HD hypotension. Reviewed outpatient hemodialysis records. Appears his EDW was recently lowered by 3kg and his SBPs were ranging in the 110s. Additionally, patient has ongoing clotting of R AVG. He was supposed to have his R AVG de-clotted this afternoon and HD afterwards at his outpatient HD center. Unfortunately, patient arrived at procedure time and NOT instructed arrival time. Also did not take his contrast pre-medications as instructed to do so. IR re-consulted. Noted this is his 3rd de-clot procedure since July; however, patient refuses [facility] placement and wants to proceed with only the de-clot. Seen and examined patient in triage room. He reports fatigue but denies SOB and CP. He remains euvolemic on exam. Awaiting lab results. Discussed with IR PA. Plan for patient to receive pre-medication tonight for scheduled de-clot tomorrow afternoon (07/05/22). Plan for HD tomorrow after de-clot procedure.   Past Medical History:  Diagnosis Date   Anemia    Anxiety    Depression    ESRD on hemodialysis ([place])    HD [facility] MWF   Hypertension    Thyroid disease    Past Surgical History:  Procedure Laterality Date   ARTERIOVENOUS GRAFT PLACEMENT Left    "forearm, it's not working; thigh"    ARTERIOVENOUS GRAFT PLACEMENT Left 11/09/2015   AV FISTULA PLACEMENT Right 01/10/2017   Procedure: INSERTION OF ARTERIOVENOUS Right thigh GORE-TEX GRAFT;  Surgeon: [name], MD;  Location: [place];  Service: Vascular;  Laterality: Right;   FALSE ANEURYSM REPAIR Left 11/09/2015   Procedure: REPAIR OF LEFT FEMORAL PSEUDOANEURYSM; REVISION  OF LEFT THIGH ARTERIOVENOUS GRAFT USING 6MM X 10 CM GORETEX GRAFT ;  Surgeon: [name], MD;  Location: [facility] OR;  Service: Vascular;  Laterality: Left;   IR AV DIALY SHUNT INTRO NEEDLE/INTRACATH INITIAL W/PTA/IMG RIGHT Right 02/06/2019   IR AV DIALY SHUNT INTRO NEEDLE/INTRACATH INITIAL W/PTA/IMG RIGHT Right 05/14/2019   IR AV DIALY SHUNT INTRO NEEDLE/INTRACATH INITIAL W/PTA/IMG RIGHT Right 08/18/2020   IR AV DIALY SHUNT INTRO NEEDLE/INTRACATH INITIAL W/PTA/IMG RIGHT Right 04/21/2022   IR DIALY SHUNT INTRO NEEDLE/INTRACATH INITIAL W/IMG RIGHT Right 09/17/2019   IR DIALY SHUNT INTRO NEEDLE/INTRACATH INITIAL W/IMG RIGHT Right 12/10/2019   IR FLUORO GUIDE CV LINE RIGHT  07/10/2021   IR FLUORO GUIDE CV LINE RIGHT  06/27/2022   IR GENERIC HISTORICAL  07/15/2016   IR US GUIDE VASC ACCESS LEFT 07/15/2016 [name], MD [facility]   IR GENERIC HISTORICAL Left 07/15/2016   IR THROMBECTOMY AV FISTULA W/THROMBOLYSIS/PTA INC/SHUNT/IMG LEFT 07/15/2016 [name], MD [facility]   IR GENERIC HISTORICAL  10/05/2016   IR US GUIDE VASC ACCESS LEFT 10/05/2016 [name], MD [facility]   IR GENERIC HISTORICAL Left 10/05/2016   IR THROMBECTOMY AV FISTULA W/THROMBOLYSIS/PTA INC/SHUNT/IMG LEFT 10/05/2016 [name], MD [facility]   IR GENERIC HISTORICAL  10/08/2016   IR FLUORO GUIDE CV LINE RIGHT 10/08/2016 [name], MD [facility]   IR GENERIC HISTORICAL  10/08/2016   IR US GUIDE VASC ACCESS RIGHT 10/08/2016 [name], MD [facility]   IR PTA AND STENT ADDL CENTRAL DIALY SEG THRU DIALY CIRCUIT RIGHT Right 12/10/2019   IR RADIOLOGIST EVAL & MGMT  11/26/2019   IR REMOVAL TUN CV CATH W/O FL  03/16/2017   IR REMOVAL TUN CV CATH W/O FL  07/22/2021   IR THROMBECTOMY AV FISTULA W/THROMBOLYSIS/PTA INC/SHUNT/IMG RIGHT Right 06/16/2018   IR THROMBECTOMY AV FISTULA  W/THROMBOLYSIS/PTA INC/SHUNT/IMG RIGHT Right 06/17/2018   IR THROMBECTOMY AV FISTULA W/THROMBOLYSIS/PTA INC/SHUNT/IMG RIGHT Right 07/15/2021   IR THROMBECTOMY AV FISTULA W/THROMBOLYSIS/PTA INC/SHUNT/IMG RIGHT Right 06/29/2022   IR US GUIDE VASC ACCESS RIGHT  06/17/2018   IR US GUIDE VASC ACCESS RIGHT  06/16/2018   IR US GUIDE VASC ACCESS RIGHT  02/06/2019   IR US GUIDE VASC ACCESS RIGHT  05/14/2019   IR US GUIDE VASC ACCESS RIGHT  09/17/2019   IR US GUIDE VASC ACCESS RIGHT  12/10/2019   IR US GUIDE VASC ACCESS RIGHT  08/18/2020   IR US GUIDE VASC ACCESS RIGHT  07/10/2021   IR US GUIDE VASC ACCESS RIGHT  07/19/2021   IR US GUIDE VASC ACCESS RIGHT  04/21/2022   IR US GUIDE VASC ACCESS RIGHT  06/27/2022   IR US GUIDE VASC ACCESS RIGHT  06/29/2022   PARATHYROIDECTOMY N/A 04/24/2014   Procedure: TOTAL 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;  Surgeon: [name], MD;  Location: [place];  Service: General;  Laterality: N/A;  NECK AND LEFT FOREARM   PERIPHERAL VASCULAR CATHETERIZATION N/A 05/05/2016   Procedure: A/V Shuntogram;  Surgeon: Conrad Alachua, MD;  Location: [place] CV LAB;  Service: Cardiovascular;  Laterality: N/A;   PERIPHERAL VASCULAR CATHETERIZATION Left 05/05/2016   Procedure: Peripheral Vascular Balloon Angioplasty;  Surgeon: Conrad San Juan, MD;  Location: [place] CV LAB;  Service: Cardiovascular;  Laterality: Left;   PSEUDOANEURYSM REPAIR Left 11/09/2015   TEE WITHOUT CARDIOVERSION N/A 02/19/2021   Procedure: TRANSESOPHAGEAL ECHOCARDIOGRAM (TEE);  Surgeon: [name], MD;  Location: [facility] END[facility];  Service: Cardiovascular;  Laterality: N/A;   THROMBECTOMY / ARTERIOVENOUS GRAFT REVISION Left 08/18/2015   thigh   THROMBECTOMY AND REVISION OF ARTERIOVENTOUS (AV) GORETEX  GRAFT Left 08/23/2015   Procedure: THROMBECTOMY AND REVISION OF ARTERIOVENTOUS (AV) GORETEX  GRAFT LEFT THIGH ;  Surgeon: [name], MD;  Location: [place];  Service: Vascular;  Laterality: Left;    THROMBECTOMY W/ EMBOLECTOMY Left 08/18/2015   Procedure: THROMBECTOMY  AND REVISION ARTERIOVENOUS GORE-TEX GRAFT/LEFT THIGH;  Surgeon: [name], MD;  Location: [place];  Service: Vascular;  Laterality: Left;   THROMBECTOMY W/ EMBOLECTOMY Right 06/19/2018   Procedure: THROMBECTOMY AND REVISION OF RIGHT THIGH  ARTERIOVENOUS GORE-TEX GRAFT;  Surgeon: [name], MD;  Location: [facility] OR;  Service: Vascular;  Laterality: Right;   UPPER EXTREMITY VENOGRAPHY Bilateral 12/27/2016   Procedure: Upper Extremity Venography;  Surgeon: [name], MD;  Location: [place] CV LAB;  Service: Cardiovascular;  Laterality: Bilateral;   VENOGRAM Left 05/05/2016   Procedure: Venogram;  Surgeon: Conrad B and E, MD;  Location: [place] CV LAB;  Service: Cardiovascular;  Laterality: Left;  lower extremity   Family History  Problem Relation Age of Onset   Renal Disease Neg Hx    Social History:  reports that he quit smoking about 36 years ago. His smoking use included cigarettes. He has never used smokeless tobacco. He reports that he does not drink alcohol and does not use drugs. Allergies  Allergen Reactions   Ivp Dye [Iodinated Contrast Media] Swelling  SWELLING REACTION UNSPECIFIED    Lisinopril Cough   Solu-Medrol [Methylprednisolone] Nausea And Vomiting   Prior to Admission medications   Medication Sig Start Date End Date Taking? Authorizing Provider  acetaminophen (TYLENOL) 500 MG tablet Take 1,000 mg by mouth daily as needed (for pain or headaches).     [provider]  B Complex-C-Folic Acid (DIALYVITE TABLET) TABS Take 1 tablet by mouth daily. 06/29/20   [provider]  predniSONE (DELTASONE) 50 MG tablet One tablet every 6 hours starting Sunday (10/1) at 2300 (11pm) for a total of 3 doses 07/03/22   Karmen Bongo, MD  TUMS ULTRA 1000 1000 MG chewable tablet Chew 4,000 mg by mouth 3 (three) times daily with meals. 03/14/20   [provider]  fluticasone  (FLONASE) 50 MCG/ACT nasal spray Place 2 sprays into both nostrils daily. Patient not taking: Reported on 06/15/2018 07/25/17 02/20/20  Larene Pickett, PA-C   Current Facility-Administered Medications  Medication Dose Route Frequency Provider Last Rate Last Admin   [START ON 07/05/2022] diphenhydrAMINE (BENADRYL) capsule 50 mg  50 mg Oral Once [name], MD       Or   Derrill Memo ON 07/05/2022] diphenhydrAMINE (BENADRYL) injection 50 mg  50 mg Intramuscular Once [name], MD       [START ON 07/05/2022] metoprolol tartrate (LOPRESSOR) tablet 100 mg  100 mg Oral Once [name], MD       predniSONE (DELTASONE) tablet 50 mg  50 mg Oral Q6H Hilty, Nadean Corwin, MD       Current Outpatient Medications  Medication Sig Dispense Refill   acetaminophen (TYLENOL) 500 MG tablet Take 1,000 mg by mouth daily as needed (for pain or headaches).      B Complex-C-Folic Acid (DIALYVITE TABLET) TABS Take 1 tablet by mouth daily.     predniSONE (DELTASONE) 50 MG tablet One tablet every 6 hours starting Sunday (10/1) at 2300 (11pm) for a total of 3 doses 3 tablet 0   TUMS ULTRA 1000 1000 MG chewable tablet Chew 4,000 mg by mouth 3 (three) times daily with meals.     Facility-Administered Medications Ordered in Other Encounters  Medication Dose Route Frequency Provider Last Rate Last Admin   0.9 %  sodium chloride infusion   Intravenous Continuous Monia Sabal, PA-C   New Bag at 02/19/21 1347   0.9 %  sodium chloride infusion   Intravenous Continuous Han, Armando Gang, PA-C       Labs: Basic Metabolic Panel: Recent Labs  Lab 06/27/22 2023 06/28/22 0332 06/29/22 0135 06/29/22 0839 06/30/22 0511 07/02/22 0948 07/02/22 0951 07/02/22 0952  NA 138   < > 135  --  136 134* 133* 133*  K 6.1*   < > 6.5*   < > 4.2 4.4 4.1 4.1  CL 90*   < > 94*  --  88* 87*  --  96*  CO2 18*   < > 23  --  29 21*  --   --   GLUCOSE 110*   < > 132*  --  134* 85  --  85  BUN 124*   < > 70*  --  46* 52*  --  50*   CREATININE 24.66*   < > 15.13*  --  11.02* 12.18*  --  13.50*  CALCIUM 8.2*   < > 7.7*  --  8.2* 9.4  --   --   PHOS 6.8*  --   --   --  6.9*  --   --   --    < > =  values in this interval not displayed.   Liver Function Tests: Recent Labs  Lab 06/27/22 2023 06/30/22 0511 07/02/22 0948  AST  --   --  24  ALT  --   --  18  ALKPHOS  --   --  68  BILITOT  --   --  0.6  PROT  --   --  9.7*  ALBUMIN 3.7 3.3* 4.5   No results for input(s): "LIPASE", "AMYLASE" in the last 168 hours. No results for input(s): "AMMONIA" in the last 168 hours. CBC: Recent Labs  Lab 06/28/22 0332 06/29/22 0135 06/29/22 2236 07/02/22 0948 07/02/22 0951 07/02/22 0952  WBC 6.1 6.2  --  6.9  --   --   NEUTROABS  --   --   --  5.1  --   --   HGB 14.0 13.6   < > 16.2 17.3* 17.7*  HCT 39.7 40.6   < > 47.5 51.0 52.0  MCV 89.6 93.1  --  91.9  --   --   PLT 256 222  --  234  --   --    < > = values in this interval not displayed.   Cardiac Enzymes: No results for input(s): "CKTOTAL", "CKMB", "CKMBINDEX", "TROPONINI" in the last 168 hours. CBG: No results for input(s): "GLUCAP" in the last 168 hours. Iron Studies: No results for input(s): "IRON", "TIBC", "TRANSFERRIN", "FERRITIN" in the last 72 hours. Studies/Results: No results found.  ROS: All others negative except those listed in HPI.  Physical Exam: Vitals:   07/04/22 1413 07/04/22 1416  BP: (!) 145/87   Pulse: 74   Resp: 18   Temp: 98.5 F (36.9 C)   TempSrc: Oral   SpO2: 98%   Weight:  65.8 kg  Height:  6' (1.829 m)     General: WDWN NAD Lungs: CTA bilaterally. No wheeze, rales or rhonchi. Breathing is unlabored. Heart: RRR. No murmur, rubs or gallops.  Abdomen: soft, nontender Lower extremities: no edema bilateral lower extremities Neuro: AAOx3. Moves all extremities spontaneously. Dialysis Access: R thigh AVG (currently malfunction)  Dialysis Orders:  MWF- [facility] 4hrs, BFR 450, DFR Auto1.5,  EDW 64.5kg  (recently lowered), 2K/ 2.5Ca Heparin 1500 unit bolus with HD No ESA or VDRA ordered  Last Labs: Awaiting lab results  Assessment/Plan: Clotted R AVG-Patient originally scheduled for de-clot this afternoon then HD afterwards but he arrived at procedure time (not instructed arrival time) and didn't take his contrast pre-meds as instructed to do so. IR re-consulted. Plan to pre-medicate him tonight for scheduled de-clot tomorrow afternoon. Plan for HD after de-clot procedure tomorrow. ESRD - on HD MWF. Currently off schedule. See above. Hypertension/volume-Recently discharged for hypotension after HD. Anti-hypertensives held. Noted EDW recently lowered. He remains euvolemic on exam. Will monitor Bps with HD. May need to raise his EDW. Anemia of CKD - Hgb >12 at outpatient. No ESA/Fe indicated at this time. Secondary Hyperparathyroidism - No VDRA in outaptient. Monitor trend. Nutrition - Renal diet with fluid restriction.  Tobie Poet, NP [facility] 07/04/2022, 3:24 PM

## 2022-07-04 NOTE — Telephone Encounter (Signed)
Attempted to call patient regarding upcoming cardiac CT appointment. °Left message on voicemail with name and callback number °Cullan Launer RN Navigator Cardiac Imaging ° Heart and Vascular Services °[phone] Office °[phone] Cell ° °

## 2022-07-05 ENCOUNTER — Inpatient Hospital Stay (HOSPITAL_COMMUNITY)
Admit: 2022-07-05 | Discharge: 2022-07-05 | Disposition: A | Discharge status: Home / Self Care | Payer: Self-pay | Attending: Internal Medicine | Admitting: Internal Medicine

## 2022-07-05 ENCOUNTER — Inpatient Hospital Stay (HOSPITAL_COMMUNITY)
Admit: 2022-07-05 | Discharge: 2022-07-05 | Disposition: A | Discharge status: Home / Self Care | Payer: Self-pay | Attending: Nephrology | Admitting: Nephrology

## 2022-07-05 ENCOUNTER — Other Ambulatory Visit: Payer: Self-pay | Admitting: Internal Medicine

## 2022-07-05 ENCOUNTER — Ambulatory Visit (HOSPITAL_COMMUNITY)
Admission: RE | Admit: 2022-07-05 | Discharge: 2022-07-05 | Disposition: A | Discharge status: Home / Self Care | Payer: Self-pay | Source: Ambulatory Visit | Attending: Cardiovascular Disease | Admitting: Cardiovascular Disease

## 2022-07-05 ENCOUNTER — Ambulatory Visit: Payer: Self-pay | Admitting: Cardiovascular Disease

## 2022-07-05 ENCOUNTER — Observation Stay (HOSPITAL_COMMUNITY): Payer: Self-pay

## 2022-07-05 DIAGNOSIS — I10 Essential (primary) hypertension: Secondary | ICD-10-CM

## 2022-07-05 DIAGNOSIS — R931 Abnormal findings on diagnostic imaging of heart and coronary circulation: Secondary | ICD-10-CM

## 2022-07-05 DIAGNOSIS — I2510 Atherosclerotic heart disease of native coronary artery without angina pectoris: Secondary | ICD-10-CM

## 2022-07-05 DIAGNOSIS — N186 End stage renal disease: Secondary | ICD-10-CM

## 2022-07-05 DIAGNOSIS — E872 Acidosis, unspecified: Secondary | ICD-10-CM

## 2022-07-05 DIAGNOSIS — Z992 Dependence on renal dialysis: Secondary | ICD-10-CM

## 2022-07-05 DIAGNOSIS — E875 Hyperkalemia: Secondary | ICD-10-CM

## 2022-07-05 DIAGNOSIS — R9431 Abnormal electrocardiogram [ECG] [EKG]: Secondary | ICD-10-CM

## 2022-07-05 HISTORY — PX: IR THROMBECTOMY AV FISTULA W/THROMBOLYSIS/PTA INC/SHUNT/IMG RIGHT: IMG6119

## 2022-07-05 HISTORY — PX: IR US GUIDE VASC ACCESS RIGHT: IMG2390

## 2022-07-05 HISTORY — PX: IR AV DIALY SHUNT INTRO NEEDLE/INTRACATH INITIAL W/PTA/IMG RIGHT: IMG6116

## 2022-07-05 LAB — CBC
HCT: 31.8 % — ABNORMAL LOW (ref 39.0–52.0)
Hemoglobin: 11 g/dL — ABNORMAL LOW (ref 13.0–17.0)
MCH: 31.3 pg (ref 26.0–34.0)
MCHC: 34.6 g/dL (ref 30.0–36.0)
MCV: 90.3 fL (ref 80.0–100.0)
Platelets: 254 10*3/uL (ref 150–400)
RBC: 3.52 MIL/uL — ABNORMAL LOW (ref 4.22–5.81)
RDW: 14.6 % (ref 11.5–15.5)
WBC: 6.4 10*3/uL (ref 4.0–10.5)
nRBC: 0 % (ref 0.0–0.2)

## 2022-07-05 LAB — RENAL FUNCTION PANEL
Albumin: 3.4 g/dL — ABNORMAL LOW (ref 3.5–5.0)
Anion gap: 26 — ABNORMAL HIGH (ref 5–15)
BUN: 120 mg/dL — ABNORMAL HIGH (ref 6–20)
CO2: 23 mmol/L (ref 22–32)
Calcium: 7.4 mg/dL — ABNORMAL LOW (ref 8.9–10.3)
Chloride: 83 mmol/L — ABNORMAL LOW (ref 98–111)
Creatinine, Ser: 20.31 mg/dL — ABNORMAL HIGH (ref 0.61–1.24)
GFR, Estimated: 2 mL/min — ABNORMAL LOW (ref >60)
Glucose, Bld: 275 mg/dL — ABNORMAL HIGH (ref 70–99)
Phosphorus: 9.5 mg/dL — ABNORMAL HIGH (ref 2.5–4.6)
Potassium: 4.9 mmol/L (ref 3.5–5.1)
Sodium: 132 mmol/L — ABNORMAL LOW (ref 135–145)

## 2022-07-05 MED ORDER — MIDAZOLAM HCL 2 MG/2ML IJ SOLN
INTRAMUSCULAR | Status: AC
  Filled 2022-07-05: qty 2

## 2022-07-05 MED ORDER — HEPARIN SODIUM (PORCINE) 1000 UNIT/ML IJ SOLN
INTRAMUSCULAR | Status: AC
  Filled 2022-07-05: qty 10

## 2022-07-05 MED ORDER — IOHEXOL 300 MG/ML  SOLN
100.0000 mL | Freq: Once | INTRAMUSCULAR | Status: AC | PRN
  Administered 2022-07-05: 80 mL via INTRAVENOUS

## 2022-07-05 MED ORDER — HEPARIN SODIUM (PORCINE) 1000 UNIT/ML IJ SOLN
INTRAMUSCULAR | Status: AC | PRN
  Administered 2022-07-05: 3000 [IU] via INTRAVENOUS

## 2022-07-05 MED ORDER — LIDOCAINE HCL 1 % IJ SOLN
INTRAMUSCULAR | Status: AC
  Filled 2022-07-05: qty 20

## 2022-07-05 MED ORDER — FENTANYL CITRATE (PF) 100 MCG/2ML IJ SOLN
INTRAMUSCULAR | Status: AC
  Filled 2022-07-05: qty 2

## 2022-07-05 MED ORDER — FENTANYL CITRATE (PF) 100 MCG/2ML IJ SOLN
INTRAMUSCULAR | Status: AC | PRN
  Administered 2022-07-05 (×4): 25 ug via INTRAVENOUS

## 2022-07-05 MED ORDER — MIDAZOLAM HCL 2 MG/2ML IJ SOLN
INTRAMUSCULAR | Status: AC | PRN
  Administered 2022-07-05: .5 mg via INTRAVENOUS
  Administered 2022-07-05: 1 mg via INTRAVENOUS
  Administered 2022-07-05: .5 mg via INTRAVENOUS

## 2022-07-05 MED ORDER — IOHEXOL 350 MG/ML SOLN
100.0000 mL | Freq: Once | INTRAVENOUS | Status: AC | PRN
  Administered 2022-07-05: 100 mL via INTRAVENOUS

## 2022-07-05 MED ORDER — NITROGLYCERIN 0.4 MG SL SUBL
SUBLINGUAL_TABLET | SUBLINGUAL | Status: AC
  Filled 2022-07-05: qty 2

## 2022-07-05 NOTE — Progress Notes (Signed)
Pt receives out-pt HD at [place] on MWF. Pt arrives at 5:00 am for 5:20 am chair time. Will assist as needed.   Melven Sartorius Renal Navigator [phone]

## 2022-07-05 NOTE — Procedures (Signed)
Interventional Radiology Procedure Note  Procedure: Declot of thrombosed right thigh AV graft  Indication: Thrombosed right thigh AV Dialysis Graft  Findings: Please refer to procedural dictation for full description.  Complications: None  EBL: < 10 mL  Miachel Roux, MD [phone]

## 2022-07-05 NOTE — Sedation Documentation (Signed)
Patient transported to [address]. HR 60s, 12 RR 100%spo2 and SBP in the 120s

## 2022-07-05 NOTE — Discharge Instructions (Signed)
Make sure to go to your usual dialysis tomorrow morning and seek medical attention right away if you develop pain, swelling, or redness at your access site

## 2022-07-05 NOTE — Discharge Summary (Signed)
Physician Discharge Summary   Patient: [name] MRN: [id number] DOB: 14-Feb-1971  Admit date:     07/04/2022  Discharge date: 07/05/22  Discharge Physician: Patrecia Pour   PCP: Patient, No Pcp Per   Recommendations at discharge:   Continue routine HD with newly declotted right AVG.  Discharge Diagnoses: Principal Problem:   Clotted dialysis access [facility]) Active Problems:   ESRD (end stage renal disease) on dialysis with hyperkalemia, metabolic acidosis    Prolonged QT interval   Essential hypertension   Hyperkalemia   Metabolic acidosis  Resolved Problems:   Complication from renal dialysis device   Problem with dialysis access [facility])  Hospital Course: HPI: [name] is a 51 y.o. 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 Last dialysis last Friday. He had a declotting procedure done on 06/28/22. His graft was successfully restored; however, over the weekend his graft was again found without bruit/thrill and unable to access. He had had one dialysis session since 06/28/22. He came to radiology today for declotting, but he was unprepared for procedure with contrast and sedation. IR discussed wit nephrology and plans are to observe overnight, pre-medicate and proceed with de-clotting procedure tomorrow vs. Tunneled HD catheter. Also due for CT coronary tomorrow.      He has been feeling good. Denies any fever/chills, vision changes/headaches, chest pain or palpitations, shortness of breath or cough, abdominal pain, N/V/D, dysuria or leg swelling.      He does not smoke or drink.    ER Course:  vitals: afebrile, bp: 145/87, HR: 74, RR: 18, oxygen: 98%RA Pertinent labs: potassium: 7.2, sodium: 132, CO2:14, BUN: 108, creatinine: 20, calcium: 7.7, AG: 29 In ED: nephrology and IR consulted. Plan for temp HD  placement.  Lokelma,sodium bicarb, insulin and albuterol given.   Hospital Course: Potassium improved. AVF was declotted in IR 10/3 and subsequently used for dialysis later that day. He is stable for discharge with plans to return to typical dialysis schedule the following morning, 10/4 as an outpatient.   Assessment and Plan: * Clotted dialysis access [facility]) 51 year old presenting with hyperkalemia due to missed dialysis secondary to clotted graft. 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 De-clotting procedure done successfully on 06/28/22 but only able to complete 1 dialysis session before graft had no thrill and no access and clotted again. Pt declines TDC. -IR/nephrology consulted, had declotting performed by Dr. Dwaine Gale 10/3, successfully accessed and used for dialysis 10/3. Ok to discharge per nephrology team.    ESRD (end stage renal disease) on dialysis with hyperkalemia, metabolic acidosis: Improved. Return to usual MWF at [place].   Prolonged QT interval Optimize electrolytes Avoid qt prolonging drugs   Essential hypertension: No changes.  Consultants: Nephrology, Dr. Jonnie Finner; IR Dr. Dwaine Gale. Procedures performed: 07/05/2022 Dr. Sharen Heck Mir: Declot of thrombosed right thigh AV graft  Disposition: Home Diet recommendation:  Renal diet DISCHARGE MEDICATION: Allergies as of 07/05/2022       Reactions   Ivp Dye [iodinated Contrast Media] Swelling   SWELLING REACTION UNSPECIFIED    Lisinopril Cough   Solu-medrol [methylprednisolone] Nausea And Vomiting        Medication List     STOP taking these medications    predniSONE 50 MG tablet Commonly known as: [place] these  medications    acetaminophen 500 MG tablet Commonly known as: TYLENOL Take 1,000 mg by mouth daily as needed (for pain or headaches).   DIALYVITE TABLET Tabs Take 1 tablet by mouth daily.   Tums Ultra 1000 400 MG chewable tablet Generic  drug: calcium elemental as carbonate Chew 4,000 mg by mouth 3 (three) times daily with meals.        [place], [facility] Follow up.   Contact information: [place] [address] [phone]                Discharge Exam: Danley Danker Weights   07/04/22 2016 07/05/22 0612 07/05/22 1533  Weight: 65 kg 65.5 kg 66.6 kg  BP (!) 143/82 (BP Location: Right Arm)   Pulse (!) 50   Temp (!) 97.5 F (36.4 C) (Oral)   Resp 19   Ht 6' (1.829 m)   Wt 66.6 kg   SpO2 100%   BMI 19.91 kg/m   Chronically ill-appearing, pleasant male in no distress, alert and oriented with RRR no MRG. Slight JVD this AM. Clear and nonlabored when completely supine. No thrill in AVG on right thigh.  Condition at discharge: stable  The results of significant diagnostics from this hospitalization (including imaging, microbiology, ancillary and laboratory) are listed below for reference.   Imaging Studies: CT CORONARY MORPH W/CTA COR W/SCORE W/CA W/CM &/OR WO/CM  Result Date: 07/05/2022 EXAM: OVER-READ INTERPRETATION  CT CHEST The following report is a limited chest CT over-read performed by radiologist Dr. Yetta Glassman of [facility], [place] on 07/05/2022. This over-read does not include interpretation of cardiac or coronary anatomy or pathology. The coronary CTA interpretation by the cardiologist is attached. COMPARISON:  CT abdomen and pelvis dated March 13th 2018 FINDINGS: Vascular: Normal heart size. No pericardial effusion. Normal caliber thoracic aorta with no atherosclerotic disease. No suspicious filling defects of the main pulmonary arteries. Mediastinum/Nodes: No pathologically enlarged lymph nodes seen in the chest. Esophagus is unremarkable. Lungs/Pleura: Lungs are clear. No pleural effusion or pneumothorax. Small right middle lobe solid pulmonary nodule measuring 3 mm on series 12, image 45, stable when compared with December 13, 2016  prior, no follow-up imaging is recommended given greater than 1 year stability. Upper Abdomen: No acute abnormality. Musculoskeletal: No chest wall mass or suspicious bone lesions identified. IMPRESSION: No significant incidental extracardiac findings. Electronically Signed   By: Yetta Glassman M.D.   On: 07/05/2022 15:30   DG Chest Port 1 View  Result Date: 07/02/2022 CLINICAL DATA:  Possible sepsis. EXAM: PORTABLE CHEST 1 VIEW COMPARISON:  Chest radiograph 06/27/2022 FINDINGS: Of note, the patient is mildly rotated on this portable examination. The heart size and mediastinal contours are within normal limits. No focal consolidation, pleural effusion, or pneumothorax. No acute osseous abnormality. 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 IMPRESSION: No acute cardiopulmonary abnormality. Electronically Signed   By: Ileana Roup M.D.   On: 07/02/2022 10:49   IR US Guide Vasc Access Right  Result Date: 06/29/2022 INDICATION: Clotted right thigh AV graft EXAM: Ultrasound-guided access of AV graft Second ultrasound-guided access of AV graft Pharmacomechanical thrombectomy of clotted AV graft Balloon angioplasty of stenotic segment of AV graft venous outflow MEDICATIONS: Per EMR; 8 mg tPA, 3000 units heparin IV Contrast material: 85 mL Omnipaque 300 ANESTHESIA/SEDATION: Moderate (conscious) sedation was employed during this procedure. A total of Versed 2.5 mg and Fentanyl 125 mcg was administered intravenously by the radiology nurse.  Total intra-service moderate Sedation Time: 78 minutes. The patient's level of consciousness and vital signs were monitored continuously by radiology nursing throughout the procedure under my direct supervision. FLUOROSCOPY: Radiation Exposure Index (as provided by the fluoroscopic device): 12 minutes (25 mGy) COMPLICATIONS: None immediate. PROCEDURE: Informed written consent was obtained from the patient after a thorough discussion of the procedural risks,  benefits and alternatives. All questions were addressed. Maximal Sterile Barrier Technique was utilized including caps, mask, sterile gowns, sterile gloves, sterile drape, hand hygiene and skin antiseptic. A timeout was performed prior to the initiation of the procedure. The patient was placed supine on the exam table. The right thigh was prepped and draped in the standard sterile fashion with inclusion of the AV graft within the sterile field. Ultrasound was used to evaluate the AV graft, which found to be clotted. An ultrasound image was permanently stored in the electronic medical record. Using ultrasound guidance, the AV graft was directly punctured using a 21 gauge micropuncture set in an antegrade fashion near the arterial anastomosis. An 018 wire was advanced centrally, followed by serial tract dilation and placement of a short 7 French sheath. Using a combination of angled catheter and 035 wire, wire and catheter were advanced into the central venous outflow past the iliac vein stent. Pull-back venogram of the venous outflow was then performed to determine the extent of clot. The iliac vein was found to be patent. The clot extended to the venous anastomosis. The clot within the graft was then laced with 8 mg tPA. While this was allowed to dwell, a second retrograde access was obtained. Ultrasound was used to evaluate the AV graft, which was again found to be clotted. An ultrasound image was permanently stored in the electronic medical record. Using ultrasound guidance, the AV graft was directly punctured using a 21 gauge micropuncture set in an retrograde fashion near the apex of the loop. An 018 wire was easily advanced, followed by serial tract dilation and placement of a short 6 French sheath. After an appropriate dwell time of the tPA, mechanical thrombectomy of the clotted AV graft was then performed using a 7 mm balloon. Pulling of the arterial plug was then performed using the retrograde access with  multiple sweeps with the Fogarty balloon. Angiography of the AV graft at this time demonstrated patent flow through the AV graft. There remains some residual clot near the apex of the loop graft which was successfully treated with repeat angioplasty. The venous outflow in the iliac vein stent was then restudy. There was an area of moderate 50-70% stenosis of the peripheral aspect of the iliac vein stent. This was successfully treated with balloon angioplasty up to 12 mm. There was appropriate luminal gain after angioplasty, with fast flow through the AV graft. At the end of the procedure, all wires and catheters were removed. Hemostasis was achieved at the access site using a combination of 0 silk suture and manual pressure. Clean dressings were placed. The patient tolerated the procedure well without immediate complication. IMPRESSION: 1. Successful pharmacomechanical thrombectomy of AV graft in the right thigh. 2. Successful balloon angioplasty of stenotic segment of the iliac vein stent up to 12 mm. ACCESS: This access remains amenable to future percutaneous interventions as clinically indicated. Electronically Signed   By: Albin Felling M.D.   On: 06/29/2022 13:56   IR THROMBECTOMY AV FISTULA/W THROMBOLYSIS/PTA INC SHUNT/IMG RIGHT  Result Date: 06/29/2022 INDICATION: Clotted right thigh AV graft EXAM: Ultrasound-guided access of AV graft Second ultrasound-guided  access of AV graft Pharmacomechanical thrombectomy of clotted AV graft Balloon angioplasty of stenotic segment of AV graft venous outflow MEDICATIONS: Per EMR; 8 mg tPA, 3000 units heparin IV Contrast material: 85 mL Omnipaque 300 ANESTHESIA/SEDATION: Moderate (conscious) sedation was employed during this procedure. A total of Versed 2.5 mg and Fentanyl 125 mcg was administered intravenously by the radiology nurse. Total intra-service moderate Sedation Time: 78 minutes. The patient's level of consciousness and vital signs were monitored continuously  by radiology nursing throughout the procedure under my direct supervision. FLUOROSCOPY: Radiation Exposure Index (as provided by the fluoroscopic device): 12 minutes (25 mGy) COMPLICATIONS: None immediate. PROCEDURE: Informed written consent was obtained from the patient after a thorough discussion of the procedural risks, benefits and alternatives. All questions were addressed. Maximal Sterile Barrier Technique was utilized including caps, mask, sterile gowns, sterile gloves, sterile drape, hand hygiene and skin antiseptic. A timeout was performed prior to the initiation of the procedure. The patient was placed supine on the exam table. The right thigh was prepped and draped in the standard sterile fashion with inclusion of the AV graft within the sterile field. Ultrasound was used to evaluate the AV graft, which found to be clotted. An ultrasound image was permanently stored in the electronic medical record. Using ultrasound guidance, the AV graft was directly punctured using a 21 gauge micropuncture set in an antegrade fashion near the arterial anastomosis. An 018 wire was advanced centrally, followed by serial tract dilation and placement of a short 7 French sheath. Using a combination of angled catheter and 035 wire, wire and catheter were advanced into the central venous outflow past the iliac vein stent. Pull-back venogram of the venous outflow was then performed to determine the extent of clot. The iliac vein was found to be patent. The clot extended to the venous anastomosis. The clot within the graft was then laced with 8 mg tPA. While this was allowed to dwell, a second retrograde access was obtained. Ultrasound was used to evaluate the AV graft, which was again found to be clotted. An ultrasound image was permanently stored in the electronic medical record. Using ultrasound guidance, the AV graft was directly punctured using a 21 gauge micropuncture set in an retrograde fashion near the apex of the loop.  An 018 wire was easily advanced, followed by serial tract dilation and placement of a short 6 French sheath. After an appropriate dwell time of the tPA, mechanical thrombectomy of the clotted AV graft was then performed using a 7 mm balloon. Pulling of the arterial plug was then performed using the retrograde access with multiple sweeps with the Fogarty balloon. Angiography of the AV graft at this time demonstrated patent flow through the AV graft. There remains some residual clot near the apex of the loop graft which was successfully treated with repeat angioplasty. The venous outflow in the iliac vein stent was then restudy. There was an area of moderate 50-70% stenosis of the peripheral aspect of the iliac vein stent. This was successfully treated with balloon angioplasty up to 12 mm. There was appropriate luminal gain after angioplasty, with fast flow through the AV graft. At the end of the procedure, all wires and catheters were removed. Hemostasis was achieved at the access site using a combination of 0 silk suture and manual pressure. Clean dressings were placed. The patient tolerated the procedure well without immediate complication. IMPRESSION: 1. Successful pharmacomechanical thrombectomy of AV graft in the right thigh. 2. Successful balloon angioplasty of stenotic segment of  the iliac vein stent up to 12 mm. ACCESS: This access remains amenable to future percutaneous interventions as clinically indicated. Electronically Signed   By: Albin Felling M.D.   On: 06/29/2022 13:56   IR Fluoro Guide CV Line Right  Result Date: 06/28/2022 INDICATION: 51 year old with end-stage renal disease and occluded AV graft. Patient needs temporary dialysis catheter. EXAM: FLUOROSCOPIC AND ULTRASOUND GUIDED PLACEMENT OF A NON-TUNNELED DIALYSIS CATHETER Physician: Stephan Minister. Henn, MD MEDICATIONS: 1% lidocaine for local anesthetic ANESTHESIA/SEDATION: None FLUOROSCOPY TIME:  Radiation Exposure Index (as provided by the  fluoroscopic device): 1 mGy Kerma COMPLICATIONS: None immediate. PROCEDURE: The procedure was explained to the patient. The risks and benefits of the procedure were discussed and the patient's questions were addressed. Informed consent was obtained from the patient. The patient was placed supine on the interventional table. Ultrasound confirmed a patent right internal jugular vein. Ultrasound images were obtained for documentation. The right neck was prepped and draped in a sterile fashion. The right neck was anesthetized with 1% lidocaine. Maximal barrier sterile technique was utilized including caps, mask, sterile gowns, sterile gloves, sterile drape, hand hygiene and skin antiseptic. A small incision was made with #11 blade scalpel. A 21 gauge needle directed into the right internal jugular vein with ultrasound guidance. A micropuncture dilator set was placed. A 20 cm Mahurkar catheter was selected. The catheter was advanced over a wire and positioned at the superior cavoatrial junction. Fluoroscopic images were obtained for documentation. Both dialysis lumens were found to aspirate and flush well. The proper amount of heparin was flushed in both lumens. The central venous lumen was flushed with normal saline. Catheter was sutured to skin. Fluoroscopic and ultrasound images were taken and saved for documentation. FINDINGS: Catheter tip at the superior cavoatrial junction. IMPRESSION: Successful placement of a right jugular non-tunneled dialysis catheter using ultrasound and fluoroscopic guidance. Electronically Signed   By: Markus Daft M.D.   On: 06/28/2022 08:19   IR US Guide Vasc Access Right  Result Date: 06/28/2022 INDICATION: 51 year old with end-stage renal disease and occluded AV graft. Patient needs temporary dialysis catheter. EXAM: FLUOROSCOPIC AND ULTRASOUND GUIDED PLACEMENT OF A NON-TUNNELED DIALYSIS CATHETER Physician: Stephan Minister. Henn, MD MEDICATIONS: 1% lidocaine for local anesthetic  ANESTHESIA/SEDATION: None FLUOROSCOPY TIME:  Radiation Exposure Index (as provided by the fluoroscopic device): 1 mGy Kerma COMPLICATIONS: None immediate. PROCEDURE: The procedure was explained to the patient. The risks and benefits of the procedure were discussed and the patient's questions were addressed. Informed consent was obtained from the patient. The patient was placed supine on the interventional table. Ultrasound confirmed a patent right internal jugular vein. Ultrasound images were obtained for documentation. The right neck was prepped and draped in a sterile fashion. The right neck was anesthetized with 1% lidocaine. Maximal barrier sterile technique was utilized including caps, mask, sterile gowns, sterile gloves, sterile drape, hand hygiene and skin antiseptic. A small incision was made with #11 blade scalpel. A 21 gauge needle directed into the right internal jugular vein with ultrasound guidance. A micropuncture dilator set was placed. A 20 cm Mahurkar catheter was selected. The catheter was advanced over a wire and positioned at the superior cavoatrial junction. Fluoroscopic images were obtained for documentation. Both dialysis lumens were found to aspirate and flush well. The proper amount of heparin was flushed in both lumens. The central venous lumen was flushed with normal saline. Catheter was sutured to skin. Fluoroscopic and ultrasound images were taken and saved for documentation. FINDINGS: Catheter tip at the  superior cavoatrial junction. IMPRESSION: Successful placement of a right jugular non-tunneled dialysis catheter using ultrasound and fluoroscopic guidance. Electronically Signed   By: Markus Daft M.D.   On: 06/28/2022 08:19   DG Chest 2 View  Result Date: 06/27/2022 CLINICAL DATA:  Shortness of breath. EXAM: CHEST - 2 VIEW COMPARISON:  Chest x-ray 06/12/2022. FINDINGS: The heart size and mediastinal contours are within normal limits. Both lungs are clear. No visible pleural  effusions or pneumothorax. No acute osseous abnormality. IMPRESSION: No evidence of acute cardiopulmonary disease. Electronically Signed   By: Margaretha Sheffield M.D.   On: 06/27/2022 10:45   VAS [place] ABI WITH/WO TBI  Result Date: 06/23/2022  LOWER EXTREMITY DOPPLER STUDY Patient Name:  Shah Insley [facility]  Date of Exam:   06/23/2022 Medical Rec #: [id number]              Accession #:    [id number] Date of Birth: Sep 17, 1971              Patient Gender: M Patient Age:   51 years Exam Location:  [facility] Procedure:      VAS [place] ABI WITH/WO TBI Referring Phys: Lawson Radar --------------------------------------------------------------------------------  Indications: Pain and tingling of feet High Risk Factors: Hypertension. Other Factors: ESRD, neuropathy.  Comparison Study: No prior study Performing Technologist: Sharion Dove RVS  Examination Guidelines: A complete evaluation includes at minimum, Doppler waveform signals and systolic blood pressure reading at the level of bilateral brachial, anterior tibial, and posterior tibial arteries, when vessel segments are accessible. Bilateral testing is considered an integral part of a complete examination. Photoelectric Plethysmograph (PPG) waveforms and toe systolic pressure readings are included as required and additional duplex testing as needed. Limited examinations for reoccurring indications may be performed as noted.  ABI Findings: +---------+------------------+-----+-----------+--------+ Right    Rt Pressure (mmHg)IndexWaveform   Comment  +---------+------------------+-----+-----------+--------+ Brachial 137                    triphasic           +---------+------------------+-----+-----------+--------+ PTA      155               1.13 multiphasic         +---------+------------------+-----+-----------+--------+ DP       156               1.14 multiphasic         +---------+------------------+-----+-----------+--------+ Great  Toe60                0.44                     +---------+------------------+-----+-----------+--------+ +---------+------------------+-----+-----------+---------+ Left     Lt Pressure (mmHg)IndexWaveform   Comment   +---------+------------------+-----+-----------+---------+ Brachial                                   Old graft +---------+------------------+-----+-----------+---------+ PTA      151               1.10 multiphasic          +---------+------------------+-----+-----------+---------+ DP       162               1.18 multiphasic          +---------+------------------+-----+-----------+---------+ Great Toe78                0.57                      +---------+------------------+-----+-----------+---------+ +-------+-----------+-----------+------------+------------+  ABI/TBIToday's ABIToday's TBIPrevious ABIPrevious TBI +-------+-----------+-----------+------------+------------+ Right  1.14       0.44                                +-------+-----------+-----------+------------+------------+ Left   1.18       0.57                                +-------+-----------+-----------+------------+------------+   Summary: Right: Resting right ankle-brachial index is within normal range. The right toe-brachial index is abnormal. Left: Resting left ankle-brachial index is within normal range. The left toe-brachial index is abnormal. *See table(s) above for measurements and observations.  Electronically signed by Monica Martinez MD on 06/23/2022 at 12:38:38 PM.    Final    VAS [place] LOWER EXTREMITY VENOUS (DVT)  Result Date: 06/23/2022  Lower Venous DVT Study Patient Name:  DAXTON NYDAM  Date of Exam:   06/23/2022 Medical Rec #: [id number]              Accession #:    [id number] Date of Birth: 1971-06-29              Patient Gender: M Patient Age:   65 years Exam Location:  [facility] Procedure:      VAS [place] LOWER EXTREMITY VENOUS (DVT) Referring Phys:  Lawson Radar --------------------------------------------------------------------------------  Indications: Numbness and tingling of left foot.  Comparison Study: No prior left LEV Performing Technologist: Sharion Dove RVS  Examination Guidelines: A complete evaluation includes B-mode imaging, spectral Doppler, color Doppler, and power Doppler as needed of all accessible portions of each vessel. Bilateral testing is considered an integral part of a complete examination. Limited examinations for reoccurring indications may be performed as noted. The reflux portion of the exam is performed with the patient in reverse Trendelenburg.  +-----+---------------+---------+-----------+----------+--------------+ RIGHTCompressibilityPhasicitySpontaneityPropertiesThrombus Aging +-----+---------------+---------+-----------+----------+--------------+ CFV  Full           Yes      Yes                                 +-----+---------------+---------+-----------+----------+--------------+ Incidental finding: Right lower extremity dialysis access is thrombosed  +---------+---------------+---------+-----------+----------+--------------+ LEFT     CompressibilityPhasicitySpontaneityPropertiesThrombus Aging +---------+---------------+---------+-----------+----------+--------------+ CFV      Full           Yes      Yes                                 +---------+---------------+---------+-----------+----------+--------------+ SFJ      Full                                                        +---------+---------------+---------+-----------+----------+--------------+ FV Prox  Full                                                        +---------+---------------+---------+-----------+----------+--------------+ FV Mid   Full                                                        +---------+---------------+---------+-----------+----------+--------------+  FV DistalFull                                                         +---------+---------------+---------+-----------+----------+--------------+ PFV      Full                                                        +---------+---------------+---------+-----------+----------+--------------+ POP      Full           Yes      Yes                                 +---------+---------------+---------+-----------+----------+--------------+ PTV      Full                                                        +---------+---------------+---------+-----------+----------+--------------+ PERO     Full                                                        +---------+---------------+---------+-----------+----------+--------------+     Summary: RIGHT: - No evidence of common femoral vein obstruction. 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  LEFT: - There is no evidence of deep vein thrombosis in the lower extremity.  *See table(s) above for measurements and observations. Electronically signed by Monica Martinez MD on 06/23/2022 at 12:38:24 PM.    Final    CT Head Wo Contrast  Result Date: 06/12/2022 CLINICAL DATA:  BLUNT POLY TRAUMA, DIZZINESS AND CHEST PAIN, HEADACHE FOR 2 WEEKS. EXAM: CT HEAD WITHOUT CONTRAST TECHNIQUE: Contiguous axial images were obtained from the base of the skull through the vertex without intravenous contrast. RADIATION DOSE REDUCTION: This exam was performed according to the departmental dose-optimization program which includes automated exposure control, adjustment of the mA and/or kV according to patient size and/or use of iterative reconstruction technique. COMPARISON:  HEAD CT 03/27/2023 FINDINGS: Brain: There is mild global atrophy including centrally but it is greater than typically seen in a 50 year old. The gray-white matter differentiation and attenuation are normal. No infarct, hemorrhage or mass are seen. Ventricles are normal in size and position. Basal cisterns are clear. Vascular: There are  scattered calcifications of the carotid siphons. There are no hyperdense central vessels. Skull: Negative for fracture or focal lesion. No scalp hematoma is seen. Sinuses/Orbits: No acute abnormality. Unremarkable orbital contents. 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 IMPRESSION: No acute intracranial CT findings, depressed skull fractures or interval changes. Electronically Signed   By: Telford Nab M.D.   On: 06/12/2022 07:27   DG Chest Portable 1 View  Result Date: 06/12/2022  CLINICAL DATA:  Short of breath with dizziness. EXAM: PORTABLE CHEST 1 VIEW COMPARISON:  03/28/2022 FINDINGS: Heart size and mediastinal contours are unremarkable. There is no pleural effusion or edema identified. No airspace opacities identified. IMPRESSION: No active disease. Electronically Signed   By: Kerby Moors M.D.   On: 06/12/2022 06:41    Microbiology: Results for orders placed or performed during the hospital encounter of 07/02/22  Blood Culture (routine x 2)     Status: None (Preliminary result)   Collection Time: 07/02/22  9:48 AM   Specimen: BLOOD  Result Value Ref Range Status   Specimen Description BLOOD SITE NOT SPECIFIED  Final   Special Requests   Final    BOTTLES DRAWN AEROBIC AND ANAEROBIC Blood Culture adequate volume   Culture   Final    NO GROWTH 3 DAYS Performed at [facility], [place] [address], [place], [place] [address]    Report Status PENDING  Incomplete  Resp Panel by RT-PCR (Flu A&B, Covid) Anterior Nasal Swab     Status: None   Collection Time: 07/02/22 10:25 AM   Specimen: Anterior Nasal Swab  Result Value Ref Range Status   SARS Coronavirus 2 by RT PCR NEGATIVE NEGATIVE Final    Comment: (NOTE) SARS-CoV-2 target nucleic acids are NOT DETECTED.  The SARS-CoV-2 RNA is generally detectable  in upper respiratory specimens during the acute phase of infection. The lowest concentration of SARS-CoV-2 viral copies this assay can detect is 138 copies/mL. A negative result does not preclude SARS-Cov-2 infection and should not be used as the sole basis for treatment or other patient management decisions. A negative result may occur with  improper specimen collection/handling, submission of specimen other than nasopharyngeal swab, presence of viral mutation(s) within the areas targeted by this assay, and inadequate number of viral copies(<138 copies/mL). A negative result must be combined with clinical observations, patient history, and epidemiological information. The expected result is Negative.  Fact Sheet for Patients:  EntrepreneurPulse.com.au  Fact Sheet for Healthcare Providers:  IncredibleEmployment.be  This test is no t yet approved or cleared by the [place] FDA and  has been authorized for detection and/or diagnosis of SARS-CoV-2 by FDA under an Emergency Use Authorization (EUA). This EUA will remain  in effect (meaning this test can be used) for the duration of the COVID-19 declaration under Section 564(b)(1) of the Act, 21 U.S.C.section 360bbb-3(b)(1), unless the authorization is terminated  or revoked sooner.       Influenza A by PCR NEGATIVE NEGATIVE Final   Influenza B by PCR NEGATIVE NEGATIVE Final    Comment: (NOTE) The Xpert Xpress SARS-CoV-2/FLU/RSV plus assay is intended as an aid in the diagnosis of influenza from Nasopharyngeal swab specimens and should not be used as a sole basis for treatment. Nasal washings and aspirates are unacceptable for Xpert Xpress SARS-CoV-2/FLU/RSV testing.  Fact Sheet for Patients: EntrepreneurPulse.com.au  Fact Sheet for Healthcare Providers: IncredibleEmployment.be  This test is not yet approved or cleared by the [place] FDA and has  been authorized for detection and/or diagnosis of SARS-CoV-2 by FDA under an Emergency Use Authorization (EUA). This EUA will remain in effect (meaning this test can be used) for the duration of the COVID-19 declaration under Section 564(b)(1) of the Act, 21 U.S.C. section 360bbb-3(b)(1), unless the authorization is terminated or revoked.  Performed at [facility], [place] [address], [place], [place] [address]     Labs: CBC: Recent Labs  Lab 06/29/22 [phone] 06/29/22 2236  07/02/22 0948 07/02/22 0951 07/02/22 0952 07/04/22 1449 07/05/22 0117  WBC 6.2  --  6.9  --   --  7.4 6.4  NEUTROABS  --   --  5.1  --   --  6.7  --   HGB 13.6   < > 16.2 17.3* 17.7* 13.0 11.0*  HCT 40.6   < > 47.5 51.0 52.0 40.8 31.8*  MCV 93.1  --  91.9  --   --  97.1 90.3  PLT 222  --  234  --   --  288 254   < > = values in this interval not displayed.   Basic Metabolic Panel: Recent Labs  Lab 06/30/22 0511 07/02/22 0948 07/02/22 0951 07/02/22 0952 07/04/22 1449 07/04/22 2124 07/05/22 0117  NA 136 134* 133* 133* 132* 132* 132*  K 4.2 4.4 4.1 4.1 7.2* 5.3* 4.9  CL 88* 87*  --  96* 89* 83* 83*  CO2 29 21*  --   --  14* 19* 23  GLUCOSE 134* 85  --  85 99 181* 275*  BUN 46* 52*  --  50* 108* 119* 120*  CREATININE 11.02* 12.18*  --  13.50* 20.00* 20.50* 20.31*  CALCIUM 8.2* 9.4  --   --  7.7* 8.0* 7.4*  MG  --   --   --   --   --  2.7*  --   PHOS 6.9*  --   --   --   --   --  9.5*   Liver Function Tests: Recent Labs  Lab 06/30/22 0511 07/02/22 0948 07/04/22 1449 07/05/22 0117  AST  --  24 17  --   ALT  --  18 14  --   ALKPHOS  --  68 64  --   BILITOT  --  0.6 0.8  --   PROT  --  9.7* 8.1  --   ALBUMIN 3.3* 4.5 4.0 3.4*   CBG: Recent Labs  Lab 07/04/22 1930  GLUCAP 194*    Discharge time spent: greater than 30 minutes.  Signed: Patrecia Pour, MD [facility] 07/05/2022

## 2022-07-05 NOTE — Progress Notes (Signed)
[facility] Progress Note   Subjective:    Seen and examined patient at bedside. No acute complaints/concerns. K+ now stabilized-now 4.9 (treated with Lokelma, D50/insulin). Recived pre-medication overnight and this AM. Scheduled for de-clot this afternoon then HD afterwards.   Objective Vitals:   07/04/22 2325 07/05/22 0310 07/05/22 0612 07/05/22 0835  BP: 131/75 129/82  (!) 142/88  Pulse: 73 (!) 58  65  Resp: 12 12  18   Temp: 97.7 F (36.5 C) 97.7 F (36.5 C)  97.8 F (36.6 C)  TempSrc: Oral Oral  Oral  SpO2: 99% 100%  100%  Weight:   65.5 kg   Height:       Physical Exam General: Awake, alert, NAD Lungs: CTA bilaterally. No wheeze, rales or rhonchi. Breathing is unlabored. Heart: RRR. No murmur, rubs or gallops.  Abdomen: soft, nontender Lower extremities: no edema bilateral lower extremities Neuro: AAOx3. Moves all extremities spontaneously. Dialysis Access: R thigh AVG (currently malfunction)  Filed Weights   07/04/22 1416 07/04/22 2016 07/05/22 0612  Weight: 65.8 kg 65 kg 65.5 kg   No intake or output data in the 24 hours ending 07/05/22 1117  Additional Objective Labs: Basic Metabolic Panel: Recent Labs  Lab 06/30/22 0511 07/02/22 0948 07/04/22 1449 07/04/22 2124 07/05/22 0117  NA 136   < > 132* 132* 132*  K 4.2   < > 7.2* 5.3* 4.9  CL 88*   < > 89* 83* 83*  CO2 29   < > 14* 19* 23  GLUCOSE 134*   < > 99 181* 275*  BUN 46*   < > 108* 119* 120*  CREATININE 11.02*   < > 20.00* 20.50* 20.31*  CALCIUM 8.2*   < > 7.7* 8.0* 7.4*  PHOS 6.9*  --   --   --  9.5*   < > = values in this interval not displayed.   Liver Function Tests: Recent Labs  Lab 07/02/22 0948 07/04/22 1449 07/05/22 0117  AST 24 17  --   ALT 18 14  --   ALKPHOS 68 64  --   BILITOT 0.6 0.8  --   PROT 9.7* 8.1  --   ALBUMIN 4.5 4.0 3.4*   No results for input(s): "LIPASE", "AMYLASE" in the last 168 hours. CBC: Recent Labs  Lab 06/29/22 0135 06/29/22 2236  07/02/22 0948 07/02/22 0951 07/02/22 0952 07/04/22 1449 07/05/22 0117  WBC 6.2  --  6.9  --   --  7.4 6.4  NEUTROABS  --   --  5.1  --   --  6.7  --   HGB 13.6   < > 16.2   < > 17.7* 13.0 11.0*  HCT 40.6   < > 47.5   < > 52.0 40.8 31.8*  MCV 93.1  --  91.9  --   --  97.1 90.3  PLT 222  --  234  --   --  288 254   < > = values in this interval not displayed.   Blood Culture    Component Value Date/Time   SDES BLOOD SITE NOT SPECIFIED 07/02/2022 0948   SPECREQUEST  07/02/2022 0948    BOTTLES DRAWN AEROBIC AND ANAEROBIC Blood Culture adequate volume   CULT  07/02/2022 0948    NO GROWTH 3 DAYS Performed at [facility], [place] [address] [place], [place] [address]    REPTSTATUS PENDING 07/02/2022 [phone]    Cardiac Enzymes: No results for input(s): "CKTOTAL", "CKMB", "CKMBINDEX", "TROPONINI"  in the last 168 hours. CBG: Recent Labs  Lab 07/04/22 1930  GLUCAP 194*   Iron Studies: No results for input(s): "IRON", "TIBC", "TRANSFERRIN", "FERRITIN" in the last 72 hours. Lab Results  Component Value Date   INR 0.9 07/02/2022   INR 0.9 06/29/2022   INR 1.0 03/26/2022   Studies/Results: No results found.  Medications:  sodium chloride      B-complex with vitamin C  1 tablet Oral Daily   calcium acetate  1,334 mg Oral TID WC   Chlorhexidine Gluconate Cloth  6 each Topical Q0600   diphenhydrAMINE  50 mg Oral Once   Or   diphenhydrAMINE  50 mg Intramuscular Once   folic acid  1 mg Oral Daily   heparin  5,000 Units Subcutaneous Q8H   predniSONE  50 mg Oral Q6H   predniSONE  50 mg Oral Once   sodium chloride flush  3 mL Intravenous Q12H   sodium zirconium cyclosilicate  10 g Oral BID    Dialysis Orders: MWF- [facility] 4hrs, BFR 450, DFR Auto1.5,  EDW 64.5kg (recently lowered), 2K/ 2.5Ca Heparin 1500 unit bolus with HD No ESA or VDRA ordered  Assessment/Plan: Clotted R AVG-Patient originally scheduled for de-clot this afternoon then HD afterwards but  he arrived at procedure time (not instructed arrival time) and didn't take his contrast pre-meds as instructed to do so. IR re-consulted. Plan to pre-medicate him tonight for scheduled de-clot tomorrow afternoon. Plan for HD after de-clot procedure this afternoon.  Hyperkalemia-He refused HD cath placement yesterday and wants to proceed with de-clot only. Resolved after medical intervention overnight: Lokelma, D50/insulin, Albuterol, Ca gluconate. K+ now 4.9.  ESRD - on HD MWF. Currently off schedule. See above. Plan for HD after de-clot this afternoon. Hypertension/volume-Recently discharged for hypotension after HD. Anti-hypertensives held. Noted EDW recently lowered. He remains euvolemic on exam. Will monitor Bps with HD. May need to raise his EDW. Anemia of CKD - Hgb 11. No ESA/Fe indicated at this time. Secondary Hyperparathyroidism - No VDRA in outaptient. Monitor trend. Nutrition - Renal diet with fluid restriction.   Tobie Poet, NP [facility] 07/05/2022,11:17 AM  LOS: 0 days

## 2022-07-06 LAB — HEPATITIS B SURFACE ANTIBODY, QUANTITATIVE: Hep B S AB Quant (Post): <3.1 m[IU]/mL — ABNORMAL LOW (ref >9.9)

## 2022-07-06 LAB — HEPATITIS B SURFACE ANTIGEN: Hepatitis B Surface Ag: REACTIVE — AB

## 2022-07-06 NOTE — Progress Notes (Signed)
[name] Progress Note   Subjective:    Seen and examined patient at bedside. S/p R thigh AVG de-clot in IR yesterday. Patient didn't go home overnight d/t feeling drained after HD. Noted net UF 1.5L which is usual UFG for him at his outpatient center. He feels better this morning. Plan was to dialyze him for one more treatment but he refused and is adamant on going home. Discussed with Dr. Jonnie Finner. Plan for discharge today. He will resume HD on Friday 10/6 per his routine schedule and will also schedule for a make-up treatment on Saturday 10/7.  Objective Vitals:   07/05/22 1900 07/05/22 2050 07/05/22 2330 07/06/22 0829  BP: (!) 153/81 (!) 146/95 120/77 114/73  Pulse: (!) 54 (!) 58 (!) 55 65  Resp: 17 15 17 16   Temp:  98.1 F (36.7 C) 97.8 F (36.6 C) 98.1 F (36.7 C)  TempSrc:  Oral Oral Oral  SpO2: 100% 100% 97% 98%  Weight:      Height:       Physical Exam General: Awake, alert, NAD Lungs: CTA bilaterally. No wheeze, rales or rhonchi. Breathing is unlabored. Heart: RRR. No murmur, rubs or gallops.  Abdomen: soft, nontender Lower extremities: no edema bilateral lower extremities Neuro: AAOx3. Moves all extremities spontaneously. Dialysis Access: R thigh AVG (+) B/T  Filed Weights   07/04/22 2016 07/05/22 0612 07/05/22 1533  Weight: 65 kg 65.5 kg 66.6 kg    Intake/Output Summary (Last 24 hours) at 07/06/2022 0957 Last data filed at 07/05/2022 2020 Gross per 24 hour  Intake --  Output 1500 ml  Net -1500 ml    Additional Objective Labs: Basic Metabolic Panel: Recent Labs  Lab 06/30/22 0511 07/02/22 0948 07/04/22 1449 07/04/22 2124 07/05/22 0117  NA 136   < > 132* 132* 132*  K 4.2   < > 7.2* 5.3* 4.9  CL 88*   < > 89* 83* 83*  CO2 29   < > 14* 19* 23  GLUCOSE 134*   < > 99 181* 275*  BUN 46*   < > 108* 119* 120*  CREATININE 11.02*   < > 20.00* 20.50* 20.31*  CALCIUM 8.2*   < > 7.7* 8.0* 7.4*  PHOS 6.9*  --   --   --  9.5*   < > = values  in this interval not displayed.   Liver Function Tests: Recent Labs  Lab 07/02/22 0948 07/04/22 1449 07/05/22 0117  AST 24 17  --   ALT 18 14  --   ALKPHOS 68 64  --   BILITOT 0.6 0.8  --   PROT 9.7* 8.1  --   ALBUMIN 4.5 4.0 3.4*   No results for input(s): "LIPASE", "AMYLASE" in the last 168 hours. CBC: Recent Labs  Lab 07/02/22 0948 07/02/22 0951 07/02/22 0952 07/04/22 1449 07/05/22 0117  WBC 6.9  --   --  7.4 6.4  NEUTROABS 5.1  --   --  6.7  --   HGB 16.2   < > 17.7* 13.0 11.0*  HCT 47.5   < > 52.0 40.8 31.8*  MCV 91.9  --   --  97.1 90.3  PLT 234  --   --  288 254   < > = values in this interval not displayed.   Blood Culture    Component Value Date/Time   SDES BLOOD SITE NOT SPECIFIED 07/02/2022 0948   SPECREQUEST  07/02/2022 0948    BOTTLES DRAWN AEROBIC AND ANAEROBIC Blood  Culture adequate volume   CULT  07/02/2022 0948    NO GROWTH 4 DAYS Performed at [facility], [place] [address], [place], [place] [address]    REPTSTATUS PENDING 07/02/2022 [phone]    Cardiac Enzymes: No results for input(s): "CKTOTAL", "CKMB", "CKMBINDEX", "TROPONINI" in the last 168 hours. CBG: Recent Labs  Lab 07/04/22 1930  GLUCAP 194*   Iron Studies: No results for input(s): "IRON", "TIBC", "TRANSFERRIN", "FERRITIN" in the last 72 hours. Lab Results  Component Value Date   INR 0.9 07/02/2022   INR 0.9 06/29/2022   INR 1.0 03/26/2022   Studies/Results: IR US Guide Vasc Access Right  Result Date: 07/05/2022 INDICATION: Thrombosed right thigh dialysis graft EXAM: 1. Ultrasound-guided antegrade access right thigh dialysis graft 2. Ultrasound-guided retrograde access of right thigh dialysis graft 3. Declot of thrombosed right thigh dialysis AV graft MEDICATIONS: None. ANESTHESIA/SEDATION: Moderate (conscious) sedation was employed during this procedure. A total of Versed 2 mg and Fentanyl 100 mcg was administered intravenously by the radiology nurse. Total intra-service  moderate Sedation Time: 62 minutes. The patient's level of consciousness and vital signs were monitored continuously by radiology nursing throughout the procedure under my direct supervision. FLUOROSCOPY: Radiation Exposure Index (as provided by the fluoroscopic device): 45 mGy Kerma COMPLICATIONS: None immediate. PROCEDURE: Informed written consent was obtained from the patient after a thorough discussion of the procedural risks, benefits and alternatives. All questions were addressed. Maximal Sterile Barrier Technique was utilized including caps, mask, sterile gowns, sterile gloves, sterile drape, hand hygiene and skin antiseptic. A timeout was performed prior to the initiation of the procedure. Patient positioned supine on the procedure table. The right anterior thigh skin prepped and draped in usual fashion. Ultrasound image documenting thrombosed right thigh AV graft was obtained and placed in permanent medical record. Sterile ultrasound probe cover and gel utilized throughout the procedure. Utilizing continuous ultrasound guidance, the AV graft was accessed in a retrograde direction with a 21 gauge needle. 21 gauge needle exchanged for a transitional dilator set over 0.018 inch guidewire. Transitional dilator set exchanged for 6 French sheath over 0.035 inch guidewire. Ultrasound image documenting thrombosed right thigh AV graft was obtained and placed in permanent medical record. Sterile ultrasound probe cover and gel utilized throughout the procedure. Utilizing continuous ultrasound guidance, the AV graft was accessed in a antegrade direction with a 21 gauge needle. 21 gauge needle exchanged for a transitional dilator set over 0.018 inch guidewire. Transitional dilator set exchanged for 6 French sheath over 0.035 inch guidewire. 5 French glide cath was advanced centrally through the antegrade sheath. Pull-back venogram showed thrombus beginning within the right external iliac vein. Guidewire advanced  centrally to the IVC and the thrombus was macerated with 7 mm balloon through the antegrade access. Glide cath and Glidewire were advanced through the retrograde sheath into the right common iliac artery. Arterial plug was pulled utilizing Fogarty balloon. Flow returned within the graft. Additional clot maceration was performed with 7 mm balloon through the antegrade access. Glide cath advanced to the right external iliac artery and fistulagram was performed. No significant stenosis was seen within the proximal graft. Focal stenosis was seen in the distal graft at multiple foci. The retrograde sheath was removed and the antegrade sheath was upsized from 6 [place] to 7 [place]. The foci of stenoses within the distal half of the graft were plasties with an 8 mm balloon resulting in excellent luminal gain and improved flow. The venous anastomosis and stent or plasty with 12  mm balloon resulting in moderate luminal gain and improved flow. Both sheaths were removed and hemostasis achieved with pursestring sutures. IMPRESSION: 1. Successful declot of thrombosed right AV graft. 2. AV graft was plastied with a 7 mm balloon. 3. Venous anastomoses and right common/external iliac vein stent plasty with 8 and 12 mm balloons. ACCESS: This access remains amenable to future percutaneous interventions as clinically indicated. Electronically Signed   By: Miachel Roux M.D.   On: 07/05/2022 17:23   IR AV DIALY SHUNT INTRO NEEDLE/INTRACATH INITIAL W/PTA/IMG RIGHT  Result Date: 07/05/2022 INDICATION: Thrombosed right thigh dialysis graft EXAM: 1. Ultrasound-guided antegrade access right thigh dialysis graft 2. Ultrasound-guided retrograde access of right thigh dialysis graft 3. Declot of thrombosed right thigh dialysis AV graft MEDICATIONS: None. ANESTHESIA/SEDATION: Moderate (conscious) sedation was employed during this procedure. A total of Versed 2 mg and Fentanyl 100 mcg was administered intravenously by the radiology nurse. Total  intra-service moderate Sedation Time: 62 minutes. The patient's level of consciousness and vital signs were monitored continuously by radiology nursing throughout the procedure under my direct supervision. FLUOROSCOPY: Radiation Exposure Index (as provided by the fluoroscopic device): 45 mGy Kerma COMPLICATIONS: None immediate. PROCEDURE: Informed written consent was obtained from the patient after a thorough discussion of the procedural risks, benefits and alternatives. All questions were addressed. Maximal Sterile Barrier Technique was utilized including caps, mask, sterile gowns, sterile gloves, sterile drape, hand hygiene and skin antiseptic. A timeout was performed prior to the initiation of the procedure. Patient positioned supine on the procedure table. The right anterior thigh skin prepped and draped in usual fashion. Ultrasound image documenting thrombosed right thigh AV graft was obtained and placed in permanent medical record. Sterile ultrasound probe cover and gel utilized throughout the procedure. Utilizing continuous ultrasound guidance, the AV graft was accessed in a retrograde direction with a 21 gauge needle. 21 gauge needle exchanged for a transitional dilator set over 0.018 inch guidewire. Transitional dilator set exchanged for 6 French sheath over 0.035 inch guidewire. Ultrasound image documenting thrombosed right thigh AV graft was obtained and placed in permanent medical record. Sterile ultrasound probe cover and gel utilized throughout the procedure. Utilizing continuous ultrasound guidance, the AV graft was accessed in a antegrade direction with a 21 gauge needle. 21 gauge needle exchanged for a transitional dilator set over 0.018 inch guidewire. Transitional dilator set exchanged for 6 French sheath over 0.035 inch guidewire. 5 French glide cath was advanced centrally through the antegrade sheath. Pull-back venogram showed thrombus beginning within the right external iliac vein. Guidewire  advanced centrally to the IVC and the thrombus was macerated with 7 mm balloon through the antegrade access. Glide cath and Glidewire were advanced through the retrograde sheath into the right common iliac artery. Arterial plug was pulled utilizing Fogarty balloon. Flow returned within the graft. Additional clot maceration was performed with 7 mm balloon through the antegrade access. Glide cath advanced to the right external iliac artery and fistulagram was performed. No significant stenosis was seen within the proximal graft. Focal stenosis was seen in the distal graft at multiple foci. The retrograde sheath was removed and the antegrade sheath was upsized from 6 [place] to 7 [place]. The foci of stenoses within the distal half of the graft were plasties with an 8 mm balloon resulting in excellent luminal gain and improved flow. The venous anastomosis and stent or plasty with 12 mm balloon resulting in moderate luminal gain and improved flow. Both sheaths were removed and hemostasis achieved with pursestring  sutures. IMPRESSION: 1. Successful declot of thrombosed right AV graft. 2. AV graft was plastied with a 7 mm balloon. 3. Venous anastomoses and right common/external iliac vein stent plasty with 8 and 12 mm balloons. ACCESS: This access remains amenable to future percutaneous interventions as clinically indicated. Electronically Signed   By: Miachel Roux M.D.   On: 07/05/2022 17:23   CT CORONARY MORPH W/CTA COR W/SCORE W/CA W/CM &/OR WO/CM  Addendum Date: 07/05/2022   ADDENDUM REPORT: 07/05/2022 16:39 HISTORY: Chest pain, nonspecific EXAM: Cardiac/Coronary CTA TECHNIQUE: The patient was scanned on a [employer]. PROTOCOL: A 120 kV prospective scan was triggered in the descending thoracic aorta at 111 HU's. Axial non-contrast 3 mm slices were carried out through the heart. The data set was analyzed on a dedicated work station and scored using the Homer. Gantry rotation speed was 250 msecs  and collimation was .6 mm. Beta blockade and 0.8 mg of sl NTG was given. The 3D data set was reconstructed in 5% intervals of the 35-75 % of the R-R cycle. Diastolic phases were analyzed on a dedicated work station using MPR, MIP and VRT modes. The patient received 124mL OMNIPAQUE IOHEXOL 350 MG/ML SOLN of contrast. FINDINGS: Quality: Very good, HR 61 Coronary calcium score: The patient's coronary artery calcium score is 259, which places the patient in the 98th percentile. Coronary arteries: Normal coronary origins.  Right dominance. Right Coronary Artery: Dominant.  No disease. Left Main Coronary Artery: Normal. Bifurcates into the LAD and LCX arteries. Left Anterior Descending Coronary Artery: Large anterior artery that wraps around the apex. Mild mixed 25-49% proximal stenosis. Large D1 branch which is diffusely calcified. There is mild to moderate stenosis (50-69%) - the vessel measures 2.5 mm at the ostium. Left Circumflex Artery: Large AV groove LCx vessel without disease. Aorta: Normal size, 37 mm at the mid ascending aorta (level of the PA bifurcation) measured double oblique. No calcifications. No dissection. Aortic Valve: Trileaflet. No calcifications. Other findings: Normal pulmonary vein drainage into the left atrium. Normal left atrial appendage without a thrombus. Normal size of the pulmonary artery. IMPRESSION: 1. 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 CT FFR will be performed and reported separately. 2. Coronary calcium score of 259. This was 98th percentile for age and sex matched control. 3. Normal coronary origin with right dominance. 4. Aggressive cardiovascular risk factor modification recommended. Electronically Signed   By: [name] M.D.   On: 07/05/2022 16:39   Result Date: 07/05/2022 EXAM: OVER-READ INTERPRETATION  CT CHEST The following report is a limited chest CT over-read performed by radiologist Dr. Yetta Glassman of [facility], [place] on 07/05/2022. This over-read  does not include interpretation of cardiac or coronary anatomy or pathology. The coronary CTA interpretation by the cardiologist is attached. COMPARISON:  CT abdomen and pelvis dated March 13th 2018 FINDINGS: Vascular: Normal heart size. No pericardial effusion. Normal caliber thoracic aorta with no atherosclerotic disease. No suspicious filling defects of the main pulmonary arteries. Mediastinum/Nodes: No pathologically enlarged lymph nodes seen in the chest. Esophagus is unremarkable. Lungs/Pleura: Lungs are clear. No pleural effusion or pneumothorax. Small right middle lobe solid pulmonary nodule measuring 3 mm on series 12, image 45, stable when compared with December 13, 2016 prior, no follow-up imaging is recommended given greater than 1 year stability. Upper Abdomen: No acute abnormality. Musculoskeletal: No chest wall mass or suspicious bone lesions identified. IMPRESSION: No significant incidental extracardiac findings. Electronically Signed: By: Yetta Glassman M.D. On: 07/05/2022  15:30   CT CORONARY FRACTIONAL FLOW RESERVE DATA PREP  Result Date: 07/05/2022 EXAM: CT FFR ANALYSIS CLINICAL DATA:  chest pain FINDINGS: FFRct analysis was performed on the original cardiac CT angiogram dataset. Diagrammatic representation of the FFRct analysis is provided in a separate PDF document in PACS. This dictation was created using the PDF document and an interactive 3D model of the results. 3D model is not available in the EMR/PACS. Normal FFR range is >0.80. 1. Left Main: No significant stenosis. FFR = 1.00 2. LAD: No significant stenosis. Proximal FFR = 0.96, Mid FFR = 0.92, Distal FFR = 0.90, Distal D1 - 0.97 3. LCX: No significant stenosis. Proximal FFR = 0.99, Distal FFR = 0.95 4. RCA: No significant stenosis. Proximal FFR = 0.99, Mid FFR = 0.96 Distal FFR = 0.92 IMPRESSION: 1.  CT FFR analysis did not show any significant stenosis. Electronically Signed   By: [name] M.D.   On: 07/05/2022 16:32   CT  CORONARY FRACTIONAL FLOW RESERVE FLUID ANALYSIS  Result Date: 07/05/2022 EXAM: CT FFR ANALYSIS CLINICAL DATA:  chest pain FINDINGS: FFRct analysis was performed on the original cardiac CT angiogram dataset. Diagrammatic representation of the FFRct analysis is provided in a separate PDF document in PACS. This dictation was created using the PDF document and an interactive 3D model of the results. 3D model is not available in the EMR/PACS. Normal FFR range is >0.80. 1. Left Main: No significant stenosis. FFR = 1.00 2. LAD: No significant stenosis. Proximal FFR = 0.96, Mid FFR = 0.92, Distal FFR = 0.90, Distal D1 - 0.97 3. LCX: No significant stenosis. Proximal FFR = 0.99, Distal FFR = 0.95 4. RCA: No significant stenosis. Proximal FFR = 0.99, Mid FFR = 0.96 Distal FFR = 0.92 IMPRESSION: 1.  CT FFR analysis did not show any significant stenosis. Electronically Signed   By: [name] M.D.   On: 07/05/2022 16:32    Medications:  sodium chloride      B-complex with vitamin C  1 tablet Oral Daily   calcium acetate  1,334 mg Oral TID WC   Chlorhexidine Gluconate Cloth  6 each Topical [id number]   folic acid  1 mg Oral Daily   heparin  5,000 Units Subcutaneous Q8H   sodium chloride flush  3 mL Intravenous Q12H    Dialysis Orders: MWF- [facility] 4hrs, BFR 450, DFR Auto1.5,  EDW 64.5kg (recently lowered), 2K/ 2.5Ca Heparin 1500 unit bolus with HD No ESA or VDRA ordered  Assessment/Plan: Clotted R AVG-s/p de-clot in IR yesterday Hyperkalemia-Resolved with medical intervention and HD. K+ now 4.9. Will re-check K+ level in outpatient. ESRD - on HD MWF. He will receive HD on Friday 10/6 in outpatient Hypertension/volume-Euvolemic on exam. EDW raised in outpatient. Will monitor Bps with HD.  Anemia of CKD - Hgb 11. No ESA/Fe indicated at this time. Secondary Hyperparathyroidism - No VDRA in outaptient. Monitor trend. Nutrition - Renal diet with fluid restriction. Dispo-Okay for discharge  from renal standpoint. He will resume HD on Friday 10/6 and will schedule a make-up treatment on Saturday 10/7.  Tobie Poet, NP [facility] 07/06/2022,9:57 AM  LOS: 1 day

## 2022-07-06 NOTE — Progress Notes (Signed)
Patient returned from dialysis after 2100. Patient stated he drove himself to hospital and did not feel he could walk to his car in the parking garage because he was drained. On call MD notified and MD instructed RN to allow patient to discharge in the morning.  Fuller Canada, RN

## 2022-07-06 NOTE — Progress Notes (Addendum)
Pt to d/c to home today. Case discussed with nephrologist and renal NP. Pt's out-pt clinic unable to complete treatment tomorrow but can treat pt Friday (pt's normal day) and Saturday (make-up treatment for today's missed treatment). Nephrologist and renal NP aware and agreeable to plan. Met with pt at bedside to discuss need for extra treatment. Contacted pt via phone to make pt aware of time change for Saturday appt. Pt will need to arrive at 9:00 am for 9:15 am chair time on Saturday. Appt info added to pt's AVS. Clinic aware pt for d/c today and will resume on Friday and Saturday.   Tracy Mounce Renal Navigator [phone] 

## 2022-07-06 NOTE — Progress Notes (Signed)
  PROGRESS NOTE  Patient was supposed to discharge home yesterday but dialysis treatment finished late at night and patient did not want to drive himself home.  Patient seen this morning.  He is dressed and standing at bedside.  Feels ready to DC home today.  Dessa Phi, DO [facility] 07/06/2022, 10:32 AM  Available via Epic secure chat 7am-7pm After these hours, please refer to coverage provider listed on [email or web address]

## 2022-07-07 ENCOUNTER — Other Ambulatory Visit (HOSPITAL_COMMUNITY): Payer: Self-pay | Admitting: Nephrology

## 2022-07-07 ENCOUNTER — Encounter (HOSPITAL_COMMUNITY): Payer: Self-pay | Admitting: Radiology

## 2022-07-07 DIAGNOSIS — N186 End stage renal disease: Secondary | ICD-10-CM

## 2022-07-07 LAB — CULTURE, BLOOD (ROUTINE X 2)
Culture: NO GROWTH
Special Requests: ADEQUATE

## 2022-07-08 NOTE — TOC Transition Note (Addendum)
Transition of care contact from inpatient facility  Date of discharge: 07/06/22 Date of contact: 07/08/22 Method: Attempted phone call Spoke to: No Answer  Called patient to discuss transition of care from recent inpatient hospitalization but he did not pick up the phone. A voicemail was left to call the HD unit at [facility] [phone] or [facility] for any questions or concerns.  Patient will follow up with his outpatient HD unit on: Friday 07/08/22 and for make-up HD on 10/7 at [facility].  Tobie Poet, NP

## 2022-07-19 ENCOUNTER — Telehealth: Payer: Self-pay

## 2022-07-19 ENCOUNTER — Encounter (INDEPENDENT_AMBULATORY_CARE_PROVIDER_SITE_OTHER): Payer: Self-pay | Admitting: Primary Care

## 2022-07-19 ENCOUNTER — Ambulatory Visit (INDEPENDENT_AMBULATORY_CARE_PROVIDER_SITE_OTHER): Payer: Self-pay | Admitting: Primary Care

## 2022-07-19 VITALS — BP 145/90 | HR 68 | Ht 72.0 in | Wt 150.0 lb

## 2022-07-19 DIAGNOSIS — Z7689 Persons encountering health services in other specified circumstances: Secondary | ICD-10-CM

## 2022-07-19 DIAGNOSIS — N186 End stage renal disease: Secondary | ICD-10-CM

## 2022-07-19 DIAGNOSIS — Z09 Encounter for follow-up examination after completed treatment for conditions other than malignant neoplasm: Secondary | ICD-10-CM

## 2022-07-19 DIAGNOSIS — B181 Chronic viral hepatitis B without delta-agent: Secondary | ICD-10-CM

## 2022-07-19 DIAGNOSIS — Z992 Dependence on renal dialysis: Secondary | ICD-10-CM

## 2022-07-19 DIAGNOSIS — M25572 Pain in left ankle and joints of left foot: Secondary | ICD-10-CM

## 2022-07-19 NOTE — Progress Notes (Signed)
Pain in right ankle Buzzing in left ear.

## 2022-07-19 NOTE — Telephone Encounter (Signed)
PCP has concerns about patient being unable to obtain [place] citizenship.   I provided him with the contact number for [employer] as well as [employer]

## 2022-07-19 NOTE — Progress Notes (Signed)
[facility]   Subjective:  [name] is a 51 y.o. male presents for hospital follow up and establish care. Admit date to the hospital was 07/04/22, patient was discharged from the hospital on 07/06/22, patient was admitted for: ESRD (end stage renal disease) on dialysis with hyperkalemia, metabolic acidosis, Essential hypertension, Hyperkalemia, Metabolic acidosis Clotted dialysis access [facility]), Prolonged QT interval, Complication from renal dialysis device Resolved and Problem with dialysis access [facility]) Resolved. Today he complains of his ankles and legs hurting and feels like pins and needles. He has dialysis 3 days a week. Patient has No headache, No chest pain, No abdominal pain - No Nausea, No new weakness tingling or numbness, No Cough - shortness of breath  Dates that he does not have dialysis he works at [employer] and on his feet 8 hours or more. Patient states since he has been out of the hospital and going to dialysis he has been extremely tired and not been working.  Patient starts crying he has nobody to help him and because of being in the hospital and out of work fear of being fired.  Placed a call to dialysis explained the situation spoke with the social worker Tess explained unable to provide a work note from his hospital discharge to now.  She also informed me that he has been at dialysis on every scheduled appointment.  Call clinical nurse to inquire about who and how he can get help for disability.  Per patient he came over on a school visa scholarship and became greatly ill and diagnosed with end-stage renal disease.  He was married and has 5 children.  Wife is a citizen and will not help him get a green card.  Unclear circumstances surrounding this. Past Medical History:  Diagnosis Date   Anemia    Anxiety    Depression    ESRD on hemodialysis ([place])    HD [facility] MWF   Hypertension    Thyroid disease      Allergies  Allergen Reactions    Ivp Dye [Iodinated Contrast Media] Swelling    SWELLING REACTION UNSPECIFIED    Lisinopril Cough   Solu-Medrol [Methylprednisolone] Nausea And Vomiting      Current Outpatient Medications on File Prior to Visit  Medication Sig Dispense Refill   amLODipine (NORVASC) 10 MG tablet Take 10 mg by mouth daily.     cloNIDine (CATAPRES) 0.1 MG tablet Take 0.1 mg by mouth 2 (two) times daily.     acetaminophen (TYLENOL) 500 MG tablet Take 1,000 mg by mouth daily as needed (for pain or headaches).  (Patient not taking: Reported on 07/19/2022)     B Complex-C-Folic Acid (DIALYVITE TABLET) TABS Take 1 tablet by mouth daily. (Patient not taking: Reported on 07/19/2022)     TUMS ULTRA 1000 1000 MG chewable tablet Chew 4,000 mg by mouth 3 (three) times daily with meals. (Patient not taking: Reported on 07/19/2022)     [DISCONTINUED] fluticasone (FLONASE) 50 MCG/ACT nasal spray Place 2 sprays into both nostrils daily. (Patient not taking: Reported on 06/15/2018) 16 g 2   Current Facility-Administered Medications on File Prior to Visit  Medication Dose Route Frequency Provider Last Rate Last Admin   0.9 %  sodium chloride infusion   Intravenous Continuous Monia Sabal, PA-C   New Bag at 02/19/21 1347     Review of System: Comprehensive ROS Pertinent positive and negative noted in HPI    Objective:  BP (!) 145/90   Pulse  68   Ht 6' (1.829 m)   Wt 150 lb (68 kg)   SpO2 99%   BMI 20.34 kg/m   Filed Weights   07/19/22 0926  Weight: 150 lb (68 kg)    Physical Exam:   General Appearance: Well nourished, thin frame BMI 20.3 in no apparent distress. Eyes: PERRLA, EOMs, conjunctiva no swelling or erythema Sinuses: No Frontal/maxillary tenderness ENT/Mouth: Ext aud canals clear, TMs without erythema, bulging. No erythema, swelling, or exudate on post pharynx.  Tonsils not swollen or erythematous. Hearing normal.  Neck: Supple, thyroid normal.  Respiratory: Respiratory effort normal, BS equal  bilaterally without rales, rhonchi, wheezing or stridor.  Cardio: RRR with no MRGs. Brisk peripheral pulses without edema.  Abdomen: Soft, + BS.  Non tender, no guarding, rebound, hernias, masses. Lymphatics: Non tender without lymphadenopathy.  Musculoskeletal: Full ROM, 5/5 strength, normal gait.  Skin: Warm, dry without rashes, lesions, ecchymosis.  Neuro: Cranial nerves intact. Normal muscle tone, no cerebellar symptoms. Sensation intact.  Psych: Awake and oriented X 3, normal affect, Insight and Judgment appropriate.    Assessment:  Favor was seen today for hospitalization follow-up.  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 Information provided by clinical nurse and sent to nurse at [facility] paperwork given to patient to try to obtain citizenship.  Acute left ankle pain Left ankle pain to the bottom that feels like numbness and tingling is of his foot intermittent 5 out of 10 pain level.  Unable to identify causes denies any trauma did not play soccer or basketball when younger.  Tried over-the-counter medication without any relief. Denies any trauma.  He works at [employer] 8 hours a day on longer standing on his feet which may be a contributing factor to the ankle pain and the numbness and tingling.   ESRD (end stage renal disease) on dialysis with hyperkalemia, metabolic acidosis Followed by nephrology has dialysis Monday Wednesday and Friday and works on the days in between This note has been created with Surveyor, quantity. Any transcriptional errors are unintentional.   Kerin Perna, NP 07/19/2022, 9:52 AM

## 2022-07-26 ENCOUNTER — Inpatient Hospital Stay: Payer: Self-pay | Admitting: Internal Medicine

## 2022-08-01 ENCOUNTER — Other Ambulatory Visit: Payer: Self-pay | Admitting: Student

## 2022-08-01 DIAGNOSIS — T82590A Other mechanical complication of surgically created arteriovenous fistula, initial encounter: Secondary | ICD-10-CM

## 2022-08-01 MED ORDER — CONTRAST ALLERGY PREMED PACK 3 X 50 MG & 1 X 50 MG PO KIT: 1.0000 | PACK | Freq: Once | ORAL | 0 refills | Status: AC

## 2022-08-01 NOTE — Progress Notes (Signed)
Contrast allergy premedication called in for patient to [facility].  Patient contacted and given instructions. He verbalizes understanding.   Brynda Greathouse, MS RD PA-C

## 2022-08-02 ENCOUNTER — Other Ambulatory Visit: Payer: Self-pay

## 2022-08-02 ENCOUNTER — Other Ambulatory Visit: Payer: Self-pay | Admitting: Student

## 2022-08-02 ENCOUNTER — Ambulatory Visit (HOSPITAL_COMMUNITY)
Admission: RE | Admit: 2022-08-02 | Discharge: 2022-08-02 | Disposition: A | Discharge status: Home / Self Care | Payer: Self-pay | Source: Ambulatory Visit | Attending: Student | Admitting: Student

## 2022-08-02 ENCOUNTER — Encounter (HOSPITAL_COMMUNITY): Payer: Self-pay

## 2022-08-02 VITALS — BP 138/90 | HR 62 | Temp 97.9°F | Resp 16 | Ht 72.0 in | Wt 138.9 lb

## 2022-08-02 DIAGNOSIS — T82868A Thrombosis of vascular prosthetic devices, implants and grafts, initial encounter: Secondary | ICD-10-CM | POA: Insufficient documentation

## 2022-08-02 DIAGNOSIS — T82590A Other mechanical complication of surgically created arteriovenous fistula, initial encounter: Secondary | ICD-10-CM

## 2022-08-02 DIAGNOSIS — T8249XD Other complication of vascular dialysis catheter, subsequent encounter: Secondary | ICD-10-CM

## 2022-08-02 DIAGNOSIS — N186 End stage renal disease: Secondary | ICD-10-CM | POA: Insufficient documentation

## 2022-08-02 DIAGNOSIS — Z992 Dependence on renal dialysis: Secondary | ICD-10-CM | POA: Insufficient documentation

## 2022-08-02 DIAGNOSIS — Y841 Kidney dialysis as the cause of abnormal reaction of the patient, or of later complication, without mention of misadventure at the time of the procedure: Secondary | ICD-10-CM | POA: Insufficient documentation

## 2022-08-02 HISTORY — PX: IR THROMBECTOMY AV FISTULA W/THROMBOLYSIS/PTA INC/SHUNT/IMG RIGHT: IMG6119

## 2022-08-02 HISTORY — PX: IR US GUIDE VASC ACCESS RIGHT: IMG2390

## 2022-08-02 MED ORDER — ALTEPLASE 2 MG IJ SOLR
INTRAMUSCULAR | Status: AC
  Filled 2022-08-02: qty 2

## 2022-08-02 MED ORDER — IOHEXOL 300 MG/ML  SOLN
100.0000 mL | Freq: Once | INTRAMUSCULAR | Status: AC | PRN
  Administered 2022-08-02: 60 mL via INTRAVENOUS

## 2022-08-02 MED ORDER — HEPARIN SODIUM (PORCINE) 1000 UNIT/ML IJ SOLN
INTRAMUSCULAR | Status: AC
  Filled 2022-08-02: qty 10

## 2022-08-02 MED ORDER — MIDAZOLAM HCL 2 MG/2ML IJ SOLN
INTRAMUSCULAR | Status: AC | PRN
  Administered 2022-08-02: .5 mg via INTRAVENOUS
  Administered 2022-08-02: 1 mg via INTRAVENOUS
  Administered 2022-08-02 (×3): .5 mg via INTRAVENOUS
  Administered 2022-08-02: 1 mg via INTRAVENOUS

## 2022-08-02 MED ORDER — SODIUM CHLORIDE 0.9 % IV SOLN: INTRAVENOUS | Status: DC

## 2022-08-02 MED ORDER — MIDAZOLAM HCL 2 MG/2ML IJ SOLN
INTRAMUSCULAR | Status: AC
  Filled 2022-08-02: qty 2

## 2022-08-02 MED ORDER — ALTEPLASE 2 MG IJ SOLR
INTRAMUSCULAR | Status: AC | PRN
  Administered 2022-08-02: 2 mg

## 2022-08-02 MED ORDER — FENTANYL CITRATE (PF) 100 MCG/2ML IJ SOLN
INTRAMUSCULAR | Status: AC
  Filled 2022-08-02: qty 4

## 2022-08-02 MED ORDER — MIDAZOLAM HCL 2 MG/2ML IJ SOLN
INTRAMUSCULAR | Status: AC
  Filled 2022-08-02: qty 4

## 2022-08-02 MED ORDER — HEPARIN SODIUM (PORCINE) 1000 UNIT/ML IJ SOLN
INTRAMUSCULAR | Status: AC | PRN
  Administered 2022-08-02: 3000 [IU]

## 2022-08-02 MED ORDER — FENTANYL CITRATE (PF) 100 MCG/2ML IJ SOLN
INTRAMUSCULAR | Status: AC | PRN
  Administered 2022-08-02: 50 ug via INTRAVENOUS
  Administered 2022-08-02 (×3): 25 ug via INTRAVENOUS
  Administered 2022-08-02: 50 ug via INTRAVENOUS
  Administered 2022-08-02: 25 ug via INTRAVENOUS

## 2022-08-02 MED ORDER — FENTANYL CITRATE (PF) 100 MCG/2ML IJ SOLN
INTRAMUSCULAR | Status: AC
  Filled 2022-08-02: qty 2

## 2022-08-02 MED ORDER — LIDOCAINE HCL (PF) 1 % IJ SOLN
INTRAMUSCULAR | Status: AC
  Filled 2022-08-02: qty 30

## 2022-08-02 MED ORDER — DIPHENHYDRAMINE HCL 50 MG PO CAPS
50.0000 mg | ORAL_CAPSULE | Freq: Once | ORAL | Status: AC
  Filled 2022-08-02: qty 1

## 2022-08-02 MED ORDER — DIPHENHYDRAMINE HCL 25 MG PO CAPS
ORAL_CAPSULE | ORAL | Status: AC
  Administered 2022-08-02: 50 mg via ORAL
  Filled 2022-08-02: qty 2

## 2022-08-02 NOTE — Procedures (Signed)
Interventional Radiology Procedure Note  Procedure: Right thigh dialysis AVGG declot with angioplasty  Complications: None  Estimated Blood Loss: < 20 mL  Findings: Successful declot after mechanical thrombectomy and balloon angioplasty of graft to 7 mm, venous anastomosis to 7 mm and 8 mm and outflow iliac vein stent to 12 mm.   [name]. Kathlene Cote, M.D Pager:  [phone]

## 2022-08-02 NOTE — H&P (Signed)
Chief Complaint: Patient was seen in consultation today for right thigh dialysis graft intervention at the request of Dr Carolin Sicks   Supervising Physician: Aletta Edouard  Patient Status: [facility] - Out-pt  History of Present Illness: [name] is a 51 y.o. male   Known to IR ESRD Clotted Right thigh graft Used without issue Friday last week--- by Saturday am-- clotted  Last intervention 07/05/22 IMPRESSION: 1. Successful declot of thrombosed right AV graft. 2. AV graft was plastied with a 7 mm balloon. 3. Venous anastomoses and right common/external iliac vein stent plasty with 8 and 12 mm balloons. ACCESS: This access remains amenable to future percutaneous interventions as clinically indicated.  Pt allergic to contrast Has taken pre medication  Scheduled today for Right thigh dialysis graft intervention    Past Medical History:  Diagnosis Date   Anemia    Anxiety    Depression    ESRD on hemodialysis ([place])    HD [place] MWF   Hypertension    Thyroid disease     Past Surgical History:  Procedure Laterality Date   ARTERIOVENOUS GRAFT PLACEMENT Left    "forearm, it's not working; thigh"    ARTERIOVENOUS GRAFT PLACEMENT Left 11/09/2015   AV FISTULA PLACEMENT Right 01/10/2017   Procedure: INSERTION OF ARTERIOVENOUS Right thigh GORE-TEX GRAFT;  Surgeon: [name], MD;  Location: [place];  Service: Vascular;  Laterality: Right;   FALSE ANEURYSM REPAIR Left 11/09/2015   Procedure: REPAIR OF LEFT FEMORAL PSEUDOANEURYSM; REVISION  OF LEFT THIGH ARTERIOVENOUS GRAFT USING 6MM X 10 CM GORETEX GRAFT ;  Surgeon: [name], MD;  Location: [place];  Service: Vascular;  Laterality: Left;   IR AV DIALY SHUNT INTRO NEEDLE/INTRACATH INITIAL W/PTA/IMG RIGHT Right 02/06/2019   IR AV DIALY SHUNT INTRO NEEDLE/INTRACATH INITIAL W/PTA/IMG RIGHT Right 05/14/2019   IR AV DIALY SHUNT INTRO NEEDLE/INTRACATH INITIAL W/PTA/IMG RIGHT Right 08/18/2020   IR AV DIALY SHUNT  INTRO NEEDLE/INTRACATH INITIAL W/PTA/IMG RIGHT Right 04/21/2022   IR DIALY SHUNT INTRO NEEDLE/INTRACATH INITIAL W/IMG RIGHT Right 09/17/2019   IR DIALY SHUNT INTRO NEEDLE/INTRACATH INITIAL W/IMG RIGHT Right 12/10/2019   IR FLUORO GUIDE CV LINE RIGHT  07/10/2021   IR FLUORO GUIDE CV LINE RIGHT  06/27/2022   IR GENERIC HISTORICAL  07/15/2016   IR US GUIDE VASC ACCESS LEFT 07/15/2016 [name], MD [facility]   IR GENERIC HISTORICAL Left 07/15/2016   IR THROMBECTOMY AV FISTULA W/THROMBOLYSIS/PTA INC/SHUNT/IMG LEFT 07/15/2016 [name], MD [facility]   IR GENERIC HISTORICAL  10/05/2016   IR US GUIDE VASC ACCESS LEFT 10/05/2016 [name], MD [facility]   IR GENERIC HISTORICAL Left 10/05/2016   IR THROMBECTOMY AV FISTULA W/THROMBOLYSIS/PTA INC/SHUNT/IMG LEFT 10/05/2016 [name], MD [facility]   IR GENERIC HISTORICAL  10/08/2016   IR FLUORO GUIDE CV LINE RIGHT 10/08/2016 [name], MD [facility]   IR GENERIC HISTORICAL  10/08/2016   IR US GUIDE VASC ACCESS RIGHT 10/08/2016 [name], MD [facility]   IR PTA AND STENT ADDL CENTRAL DIALY SEG THRU DIALY CIRCUIT RIGHT Right 12/10/2019   IR RADIOLOGIST EVAL & MGMT  11/26/2019   IR REMOVAL TUN CV CATH W/O FL  03/16/2017   IR REMOVAL TUN CV CATH W/O FL  07/22/2021   IR THROMBECTOMY AV FISTULA W/THROMBOLYSIS/PTA INC/SHUNT/IMG RIGHT Right 06/16/2018   IR THROMBECTOMY AV FISTULA W/THROMBOLYSIS/PTA INC/SHUNT/IMG RIGHT Right 06/17/2018   IR THROMBECTOMY AV FISTULA W/THROMBOLYSIS/PTA INC/SHUNT/IMG RIGHT Right 07/15/2021   IR THROMBECTOMY AV  FISTULA W/THROMBOLYSIS/PTA INC/SHUNT/IMG RIGHT Right 06/29/2022   IR THROMBECTOMY AV FISTULA W/THROMBOLYSIS/PTA INC/SHUNT/IMG RIGHT Right 07/05/2022   IR US GUIDE VASC ACCESS RIGHT  06/17/2018   IR US GUIDE VASC ACCESS RIGHT  06/16/2018   IR US GUIDE VASC ACCESS RIGHT  02/06/2019   IR US GUIDE VASC ACCESS RIGHT  05/14/2019   IR US GUIDE VASC ACCESS RIGHT  09/17/2019   IR US GUIDE VASC ACCESS RIGHT  12/10/2019   IR US  GUIDE VASC ACCESS RIGHT  08/18/2020   IR US GUIDE VASC ACCESS RIGHT  07/10/2021   IR US GUIDE VASC ACCESS RIGHT  07/19/2021   IR US GUIDE VASC ACCESS RIGHT  04/21/2022   IR US GUIDE VASC ACCESS RIGHT  06/27/2022   IR US GUIDE VASC ACCESS RIGHT  06/29/2022   IR US GUIDE VASC ACCESS RIGHT  07/05/2022   PARATHYROIDECTOMY N/A 04/24/2014   Procedure: TOTAL 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;  Surgeon: [name], MD;  Location: [place];  Service: General;  Laterality: N/A;  NECK AND LEFT FOREARM   PERIPHERAL VASCULAR CATHETERIZATION N/A 05/05/2016   Procedure: A/V Shuntogram;  Surgeon: Conrad Daykin, MD;  Location: [place] CV LAB;  Service: Cardiovascular;  Laterality: N/A;   PERIPHERAL VASCULAR CATHETERIZATION Left 05/05/2016   Procedure: Peripheral Vascular Balloon Angioplasty;  Surgeon: Conrad Los Nopalitos, MD;  Location: [place] [facility];  Service: Cardiovascular;  Laterality: Left;   PSEUDOANEURYSM REPAIR Left 11/09/2015   TEE WITHOUT CARDIOVERSION N/A 02/19/2021   Procedure: TRANSESOPHAGEAL ECHOCARDIOGRAM (TEE);  Surgeon: [name], MD;  Location: [facility] ENDOSCOPY;  Service: Cardiovascular;  Laterality: N/A;   THROMBECTOMY / ARTERIOVENOUS GRAFT REVISION Left 08/18/2015   thigh   THROMBECTOMY AND REVISION OF ARTERIOVENTOUS (AV) GORETEX  GRAFT Left 08/23/2015   Procedure: THROMBECTOMY AND REVISION OF ARTERIOVENTOUS (AV) GORETEX  GRAFT LEFT THIGH ;  Surgeon: [name], MD;  Location: [place];  Service: Vascular;  Laterality: Left;   THROMBECTOMY W/ EMBOLECTOMY Left 08/18/2015   Procedure: THROMBECTOMY  AND REVISION ARTERIOVENOUS GORE-TEX GRAFT/LEFT THIGH;  Surgeon: [name], MD;  Location: [place];  Service: Vascular;  Laterality: Left;   THROMBECTOMY W/ EMBOLECTOMY Right 06/19/2018   Procedure: THROMBECTOMY AND REVISION OF RIGHT THIGH  ARTERIOVENOUS GORE-TEX GRAFT;  Surgeon: [name], MD;  Location: [facility];  Service: Vascular;  Laterality: Right;   UPPER  EXTREMITY VENOGRAPHY Bilateral 12/27/2016   Procedure: Upper Extremity Venography;  Surgeon: [name], MD;  Location: [place] CV LAB;  Service: Cardiovascular;  Laterality: Bilateral;   VENOGRAM Left 05/05/2016   Procedure: Venogram;  Surgeon: Conrad Welda, MD;  Location: [place] [facility];  Service: Cardiovascular;  Laterality: Left;  lower extremity    Allergies: Ivp dye [iodinated contrast media], Lisinopril, and Solu-medrol [methylprednisolone]  Medications: Prior to Admission medications   Medication Sig Start Date End Date Taking? Authorizing Provider  amLODipine (NORVASC) 10 MG tablet Take 10 mg by mouth daily.   Yes [provider]  cloNIDine (CATAPRES) 0.1 MG tablet Take 0.1 mg by mouth 2 (two) times daily.   Yes [provider]  acetaminophen (TYLENOL) 500 MG tablet Take 1,000 mg by mouth daily as needed (for pain or headaches).  Patient not taking: Reported on 07/19/2022    [provider]  B Complex-C-Folic Acid (DIALYVITE TABLET) TABS Take 1 tablet by mouth daily. Patient not taking: Reported on 07/19/2022 06/29/20   [provider]  TUMS ULTRA 1000 1000 MG chewable  tablet Chew 4,000 mg by mouth 3 (three) times daily with meals. Patient not taking: Reported on 07/19/2022 03/14/20   [provider]  fluticasone (FLONASE) 50 MCG/ACT nasal spray Place 2 sprays into both nostrils daily. Patient not taking: Reported on 06/15/2018 07/25/17 02/20/20  Larene Pickett, PA-C     Family History  Problem Relation Age of Onset   Renal Disease Neg Hx     Social History   Socioeconomic History   Marital status: Married    Spouse name: Not on file   Number of children: Not on file   Years of education: Not on file   Highest education level: Not on file  Occupational History   Not on file  Tobacco Use   Smoking status: Former    Types: Cigarettes    Quit date: 03/21/1986    Years since quitting: 36.3   Smokeless tobacco: Never   Vaping Use   Vaping Use: Never used  Substance and Sexual Activity   Alcohol use: No    Alcohol/week: 0.0 standard drinks of alcohol   Drug use: No   Sexual activity: Not on file  Other Topics Concern   Not on file  Social History Narrative   Not on file   Social Determinants of Health   Financial Resource Strain: Not on file  Food Insecurity: No Food Insecurity (07/05/2022)   Hunger Vital Sign    Worried About Running Out of Food in the Last Year: Never true    Ran Out of Food in the Last Year: Never true  Transportation Needs: No Transportation Needs (07/05/2022)   PRAPARE - Hydrologist (Medical): No    Lack of Transportation (Non-Medical): No  Physical Activity: Not on file  Stress: Not on file  Social Connections: Not on file    Review of Systems: A 12 point ROS discussed and pertinent positives are indicated in the HPI above.  All other systems are negative.  Review of Systems  Constitutional:  Negative for activity change, fatigue and fever.  Respiratory:  Negative for cough and shortness of breath.   Gastrointestinal:  Negative for abdominal pain.  Psychiatric/Behavioral:  Negative for behavioral problems and confusion.     Vital Signs: BP (!) 152/93   Pulse 64   Temp 97.9 F (36.6 C) (Oral)   Resp 18   Ht 6' (1.829 m)   Wt 138 lb 14.2 oz (63 kg)   SpO2 99%   BMI 18.84 kg/m     Physical Exam Vitals reviewed.  HENT:     Mouth/Throat:     Mouth: Mucous membranes are moist.  Cardiovascular:     Rate and Rhythm: Normal rate and regular rhythm.  Pulmonary:     Effort: Pulmonary effort is normal.     Breath sounds: Normal breath sounds. No wheezing.  Abdominal:     Palpations: Abdomen is soft.  Musculoskeletal:        General: Normal range of motion.     Comments: Right thigh graft-- no pulses; no thrill  Skin:    General: Skin is warm.  Neurological:     Mental Status: He is alert and oriented to person, place, and  time.  Psychiatric:        Behavior: Behavior normal.     Imaging: IR US Guide Vasc Access Right  Result Date: 07/05/2022 INDICATION: Thrombosed right thigh dialysis graft EXAM: 1. Ultrasound-guided antegrade access right thigh dialysis graft 2. Ultrasound-guided retrograde access of  right thigh dialysis graft 3. Declot of thrombosed right thigh dialysis AV graft MEDICATIONS: None. ANESTHESIA/SEDATION: Moderate (conscious) sedation was employed during this procedure. A total of Versed 2 mg and Fentanyl 100 mcg was administered intravenously by the radiology nurse. Total intra-service moderate Sedation Time: 62 minutes. The patient's level of consciousness and vital signs were monitored continuously by radiology nursing throughout the procedure under my direct supervision. FLUOROSCOPY: Radiation Exposure Index (as provided by the fluoroscopic device): 45 mGy Kerma COMPLICATIONS: None immediate. PROCEDURE: Informed written consent was obtained from the patient after a thorough discussion of the procedural risks, benefits and alternatives. All questions were addressed. Maximal Sterile Barrier Technique was utilized including caps, mask, sterile gowns, sterile gloves, sterile drape, hand hygiene and skin antiseptic. A timeout was performed prior to the initiation of the procedure. Patient positioned supine on the procedure table. The right anterior thigh skin prepped and draped in usual fashion. Ultrasound image documenting thrombosed right thigh AV graft was obtained and placed in permanent medical record. Sterile ultrasound probe cover and gel utilized throughout the procedure. Utilizing continuous ultrasound guidance, the AV graft was accessed in a retrograde direction with a 21 gauge needle. 21 gauge needle exchanged for a transitional dilator set over 0.018 inch guidewire. Transitional dilator set exchanged for 6 French sheath over 0.035 inch guidewire. Ultrasound image documenting thrombosed right thigh  AV graft was obtained and placed in permanent medical record. Sterile ultrasound probe cover and gel utilized throughout the procedure. Utilizing continuous ultrasound guidance, the AV graft was accessed in a antegrade direction with a 21 gauge needle. 21 gauge needle exchanged for a transitional dilator set over 0.018 inch guidewire. Transitional dilator set exchanged for 6 French sheath over 0.035 inch guidewire. 5 French glide cath was advanced centrally through the antegrade sheath. Pull-back venogram showed thrombus beginning within the right external iliac vein. Guidewire advanced centrally to the IVC and the thrombus was macerated with 7 mm balloon through the antegrade access. Glide cath and Glidewire were advanced through the retrograde sheath into the right common iliac artery. Arterial plug was pulled utilizing Fogarty balloon. Flow returned within the graft. Additional clot maceration was performed with 7 mm balloon through the antegrade access. Glide cath advanced to the right external iliac artery and fistulagram was performed. No significant stenosis was seen within the proximal graft. Focal stenosis was seen in the distal graft at multiple foci. The retrograde sheath was removed and the antegrade sheath was upsized from 6 [place] to 7 [place]. The foci of stenoses within the distal half of the graft were plasties with an 8 mm balloon resulting in excellent luminal gain and improved flow. The venous anastomosis and stent or plasty with 12 mm balloon resulting in moderate luminal gain and improved flow. Both sheaths were removed and hemostasis achieved with pursestring sutures. IMPRESSION: 1. Successful declot of thrombosed right AV graft. 2. AV graft was plastied with a 7 mm balloon. 3. Venous anastomoses and right common/external iliac vein stent plasty with 8 and 12 mm balloons. ACCESS: This access remains amenable to future percutaneous interventions as clinically indicated. Electronically Signed    By: Miachel Roux M.D.   On: 07/05/2022 17:23   IR THROMBECTOMY AV FISTULA/W THROMBOLYSIS/PTA INC SHUNT/IMG RIGHT  Result Date: 07/05/2022 INDICATION: Thrombosed right thigh dialysis graft EXAM: 1. Ultrasound-guided antegrade access right thigh dialysis graft 2. Ultrasound-guided retrograde access of right thigh dialysis graft 3. Declot of thrombosed right thigh dialysis AV graft MEDICATIONS: None. ANESTHESIA/SEDATION: Moderate (conscious) sedation was  employed during this procedure. A total of Versed 2 mg and Fentanyl 100 mcg was administered intravenously by the radiology nurse. Total intra-service moderate Sedation Time: 62 minutes. The patient's level of consciousness and vital signs were monitored continuously by radiology nursing throughout the procedure under my direct supervision. FLUOROSCOPY: Radiation Exposure Index (as provided by the fluoroscopic device): 45 mGy Kerma COMPLICATIONS: None immediate. PROCEDURE: Informed written consent was obtained from the patient after a thorough discussion of the procedural risks, benefits and alternatives. All questions were addressed. Maximal Sterile Barrier Technique was utilized including caps, mask, sterile gowns, sterile gloves, sterile drape, hand hygiene and skin antiseptic. A timeout was performed prior to the initiation of the procedure. Patient positioned supine on the procedure table. The right anterior thigh skin prepped and draped in usual fashion. Ultrasound image documenting thrombosed right thigh AV graft was obtained and placed in permanent medical record. Sterile ultrasound probe cover and gel utilized throughout the procedure. Utilizing continuous ultrasound guidance, the AV graft was accessed in a retrograde direction with a 21 gauge needle. 21 gauge needle exchanged for a transitional dilator set over 0.018 inch guidewire. Transitional dilator set exchanged for 6 French sheath over 0.035 inch guidewire. Ultrasound image documenting thrombosed  right thigh AV graft was obtained and placed in permanent medical record. Sterile ultrasound probe cover and gel utilized throughout the procedure. Utilizing continuous ultrasound guidance, the AV graft was accessed in a antegrade direction with a 21 gauge needle. 21 gauge needle exchanged for a transitional dilator set over 0.018 inch guidewire. Transitional dilator set exchanged for 6 French sheath over 0.035 inch guidewire. 5 French glide cath was advanced centrally through the antegrade sheath. Pull-back venogram showed thrombus beginning within the right external iliac vein. Guidewire advanced centrally to the IVC and the thrombus was macerated with 7 mm balloon through the antegrade access. Glide cath and Glidewire were advanced through the retrograde sheath into the right common iliac artery. Arterial plug was pulled utilizing Fogarty balloon. Flow returned within the graft. Additional clot maceration was performed with 7 mm balloon through the antegrade access. Glide cath advanced to the right external iliac artery and fistulagram was performed. No significant stenosis was seen within the proximal graft. Focal stenosis was seen in the distal graft at multiple foci. The retrograde sheath was removed and the antegrade sheath was upsized from 6 [place] to 7 [place]. The foci of stenoses within the distal half of the graft were plasties with an 8 mm balloon resulting in excellent luminal gain and improved flow. The venous anastomosis and stent or plasty with 12 mm balloon resulting in moderate luminal gain and improved flow. Both sheaths were removed and hemostasis achieved with pursestring sutures. IMPRESSION: 1. Successful declot of thrombosed right AV graft. 2. AV graft was plastied with a 7 mm balloon. 3. Venous anastomoses and right common/external iliac vein stent plasty with 8 and 12 mm balloons. ACCESS: This access remains amenable to future percutaneous interventions as clinically indicated.  Electronically Signed   By: Miachel Roux M.D.   On: 07/05/2022 17:23   CT CORONARY MORPH W/CTA COR W/SCORE W/CA W/CM &/OR WO/CM  Addendum Date: 07/05/2022   ADDENDUM REPORT: 07/05/2022 16:39 HISTORY: Chest pain, nonspecific EXAM: Cardiac/Coronary CTA TECHNIQUE: The patient was scanned on a [employer]. PROTOCOL: A 120 kV prospective scan was triggered in the descending thoracic aorta at 111 HU's. Axial non-contrast 3 mm slices were carried out through the heart. The data set was analyzed on a dedicated  work station and scored using the [employer]. Gantry rotation speed was 250 msecs and collimation was .6 mm. Beta blockade and 0.8 mg of sl NTG was given. The 3D data set was reconstructed in 5% intervals of the 35-75 % of the R-R cycle. Diastolic phases were analyzed on a dedicated work station using MPR, MIP and VRT modes. The patient received 172mL OMNIPAQUE IOHEXOL 350 MG/ML SOLN of contrast. FINDINGS: Quality: Very good, HR 61 Coronary calcium score: The patient's coronary artery calcium score is 259, which places the patient in the 98th percentile. Coronary arteries: Normal coronary origins.  Right dominance. Right Coronary Artery: Dominant.  No disease. Left Main Coronary Artery: Normal. Bifurcates into the LAD and LCX arteries. Left Anterior Descending Coronary Artery: Large anterior artery that wraps around the apex. Mild mixed 25-49% proximal stenosis. Large D1 branch which is diffusely calcified. There is mild to moderate stenosis (50-69%) - the vessel measures 2.5 mm at the ostium. Left Circumflex Artery: Large AV groove LCx vessel without disease. Aorta: Normal size, 37 mm at the mid ascending aorta (level of the PA bifurcation) measured double oblique. No calcifications. No dissection. Aortic Valve: Trileaflet. No calcifications. Other findings: Normal pulmonary vein drainage into the left atrium. Normal left atrial appendage without a thrombus. Normal size of the pulmonary artery.  IMPRESSION: 1. 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 CT FFR will be performed and reported separately. 2. Coronary calcium score of 259. This was 98th percentile for age and sex matched control. 3. Normal coronary origin with right dominance. 4. Aggressive cardiovascular risk factor modification recommended. Electronically Signed   By: [name] M.D.   On: 07/05/2022 16:39   Result Date: 07/05/2022 EXAM: OVER-READ INTERPRETATION  CT CHEST The following report is a limited chest CT over-read performed by radiologist Dr. Yetta Glassman of [facility], [place] on 07/05/2022. This over-read does not include interpretation of cardiac or coronary anatomy or pathology. The coronary CTA interpretation by the cardiologist is attached. COMPARISON:  CT abdomen and pelvis dated March 13th 2018 FINDINGS: Vascular: Normal heart size. No pericardial effusion. Normal caliber thoracic aorta with no atherosclerotic disease. No suspicious filling defects of the main pulmonary arteries. Mediastinum/Nodes: No pathologically enlarged lymph nodes seen in the chest. Esophagus is unremarkable. Lungs/Pleura: Lungs are clear. No pleural effusion or pneumothorax. Small right middle lobe solid pulmonary nodule measuring 3 mm on series 12, image 45, stable when compared with December 13, 2016 prior, no follow-up imaging is recommended given greater than 1 year stability. Upper Abdomen: No acute abnormality. Musculoskeletal: No chest wall mass or suspicious bone lesions identified. IMPRESSION: No significant incidental extracardiac findings. Electronically Signed: By: Yetta Glassman M.D. On: 07/05/2022 15:30   CT CORONARY FRACTIONAL FLOW RESERVE DATA PREP  Result Date: 07/05/2022 EXAM: CT FFR ANALYSIS CLINICAL DATA:  chest pain FINDINGS: FFRct analysis was performed on the original cardiac CT angiogram dataset. Diagrammatic representation of the FFRct analysis is provided in a separate PDF document in PACS. This dictation was  created using the PDF document and an interactive 3D model of the results. 3D model is not available in the EMR/PACS. Normal FFR range is >0.80. 1. Left Main: No significant stenosis. FFR = 1.00 2. LAD: No significant stenosis. Proximal FFR = 0.96, Mid FFR = 0.92, Distal FFR = 0.90, Distal D1 - 0.97 3. LCX: No significant stenosis. Proximal FFR = 0.99, Distal FFR = 0.95 4. RCA: No significant stenosis. Proximal FFR = 0.99, Mid FFR = 0.96 Distal  FFR = 0.92 IMPRESSION: 1.  CT FFR analysis did not show any significant stenosis. Electronically Signed   By: [name] M.D.   On: 07/05/2022 16:32   CT CORONARY FRACTIONAL FLOW RESERVE FLUID ANALYSIS  Result Date: 07/05/2022 EXAM: CT FFR ANALYSIS CLINICAL DATA:  chest pain FINDINGS: FFRct analysis was performed on the original cardiac CT angiogram dataset. Diagrammatic representation of the FFRct analysis is provided in a separate PDF document in PACS. This dictation was created using the PDF document and an interactive 3D model of the results. 3D model is not available in the EMR/PACS. Normal FFR range is >0.80. 1. Left Main: No significant stenosis. FFR = 1.00 2. LAD: No significant stenosis. Proximal FFR = 0.96, Mid FFR = 0.92, Distal FFR = 0.90, Distal D1 - 0.97 3. LCX: No significant stenosis. Proximal FFR = 0.99, Distal FFR = 0.95 4. RCA: No significant stenosis. Proximal FFR = 0.99, Mid FFR = 0.96 Distal FFR = 0.92 IMPRESSION: 1.  CT FFR analysis did not show any significant stenosis. Electronically Signed   By: [name] M.D.   On: 07/05/2022 16:32    Labs:  CBC: Recent Labs    06/29/22 0135 06/29/22 2236 07/02/22 0948 07/02/22 0951 07/02/22 0952 07/04/22 1449 07/05/22 0117  WBC 6.2  --  6.9  --   --  7.4 6.4  HGB 13.6   < > 16.2 17.3* 17.7* 13.0 11.0*  HCT 40.6   < > 47.5 51.0 52.0 40.8 31.8*  PLT 222  --  234  --   --  288 254   < > = values in this interval not displayed.    COAGS: Recent Labs    03/26/22 1755 06/29/22 0135  07/02/22 0948  INR 1.0 0.9 0.9  APTT 33  --  31    BMP: Recent Labs    07/02/22 0948 07/02/22 0951 07/02/22 0952 07/04/22 1449 07/04/22 2124 07/05/22 0117  NA 134*   < > 133* 132* 132* 132*  K 4.4   < > 4.1 7.2* 5.3* 4.9  CL 87*  --  96* 89* 83* 83*  CO2 21*  --   --  14* 19* 23  GLUCOSE 85  --  85 99 181* 275*  BUN 52*  --  50* 108* 119* 120*  CALCIUM 9.4  --   --  7.7* 8.0* 7.4*  CREATININE 12.18*  --  13.50* 20.00* 20.50* 20.31*  GFRNONAA 5*  --   --  3* 2* 2*   < > = values in this interval not displayed.    LIVER FUNCTION TESTS: Recent Labs    03/26/22 1755 06/27/22 1029 06/27/22 2023 06/30/22 0511 07/02/22 0948 07/04/22 1449 07/05/22 0117  BILITOT 0.8 0.8  --   --  0.6 0.8  --   AST 23 13*  --   --  24 17  --   ALT 16 14  --   --  18 14  --   ALKPHOS 50 65  --   --  68 64  --   PROT 7.6 9.1*  --   --  9.7* 8.1  --   ALBUMIN 3.8 4.2   < > 3.3* 4.5 4.0 3.4*   < > = values in this interval not displayed.    TUMOR MARKERS: No results for input(s): "AFPTM", "CEA", "CA199", "CHROMGRNA" in the last 8760 hours.  Assessment and Plan:  ESRD Clotted right thigh dialysis graft Scheduled for evaluation and intervention Risks and  benefits discussed with the patient including, but not limited to bleeding, infection, vascular injury, pulmonary embolism, need for tunneled HD catheter placement or even death.  All of the patient's questions were answered, patient is agreeable to proceed. Consent signed and in chart.    Thank you for this interesting consult.  I greatly enjoyed meeting [name] and look forward to participating in their care.  A copy of this report was sent to the requesting provider on this date.  Electronically Signed: Lavonia Drafts, PA-C 08/02/2022, 10:08 AM   I spent a total of    25 Minutes in face to face in clinical consultation, greater than 50% of which was counseling/coordinating care for right thigh graft intervention

## 2022-08-02 NOTE — Progress Notes (Signed)
Patient was given discharge instructions. He verbalized understanding. 

## 2022-08-05 ENCOUNTER — Encounter (HOSPITAL_COMMUNITY): Payer: Self-pay

## 2022-08-05 ENCOUNTER — Observation Stay (HOSPITAL_COMMUNITY)
Admission: EM | Admit: 2022-08-05 | Discharge: 2022-08-06 | Disposition: A | Discharge status: Home / Self Care | Payer: Self-pay | Attending: Internal Medicine | Admitting: Internal Medicine

## 2022-08-05 ENCOUNTER — Other Ambulatory Visit: Payer: Self-pay

## 2022-08-05 ENCOUNTER — Emergency Department (HOSPITAL_COMMUNITY): Payer: Self-pay

## 2022-08-05 DIAGNOSIS — D631 Anemia in chronic kidney disease: Secondary | ICD-10-CM | POA: Insufficient documentation

## 2022-08-05 DIAGNOSIS — T8249XA Other complication of vascular dialysis catheter, initial encounter: Secondary | ICD-10-CM | POA: Diagnosis present

## 2022-08-05 DIAGNOSIS — N186 End stage renal disease: Secondary | ICD-10-CM | POA: Insufficient documentation

## 2022-08-05 DIAGNOSIS — Z992 Dependence on renal dialysis: Secondary | ICD-10-CM | POA: Insufficient documentation

## 2022-08-05 DIAGNOSIS — I132 Hypertensive heart and chronic kidney disease with heart failure and with stage 5 chronic kidney disease, or end stage renal disease: Secondary | ICD-10-CM | POA: Insufficient documentation

## 2022-08-05 DIAGNOSIS — I5022 Chronic systolic (congestive) heart failure: Secondary | ICD-10-CM | POA: Insufficient documentation

## 2022-08-05 DIAGNOSIS — Z789 Other specified health status: Secondary | ICD-10-CM

## 2022-08-05 DIAGNOSIS — E875 Hyperkalemia: Secondary | ICD-10-CM | POA: Insufficient documentation

## 2022-08-05 DIAGNOSIS — Z79899 Other long term (current) drug therapy: Secondary | ICD-10-CM | POA: Insufficient documentation

## 2022-08-05 DIAGNOSIS — T82868A Thrombosis of vascular prosthetic devices, implants and grafts, initial encounter: Principal | ICD-10-CM | POA: Insufficient documentation

## 2022-08-05 DIAGNOSIS — Z87891 Personal history of nicotine dependence: Secondary | ICD-10-CM | POA: Insufficient documentation

## 2022-08-05 DIAGNOSIS — Y832 Surgical operation with anastomosis, bypass or graft as the cause of abnormal reaction of the patient, or of later complication, without mention of misadventure at the time of the procedure: Secondary | ICD-10-CM | POA: Insufficient documentation

## 2022-08-05 DIAGNOSIS — E872 Acidosis, unspecified: Secondary | ICD-10-CM | POA: Insufficient documentation

## 2022-08-05 LAB — BASIC METABOLIC PANEL
Anion gap: 23 — ABNORMAL HIGH (ref 5–15)
BUN: 89 mg/dL — ABNORMAL HIGH (ref 6–20)
CO2: 21 mmol/L — ABNORMAL LOW (ref 22–32)
Calcium: 8 mg/dL — ABNORMAL LOW (ref 8.9–10.3)
Chloride: 94 mmol/L — ABNORMAL LOW (ref 98–111)
Creatinine, Ser: 16.33 mg/dL — ABNORMAL HIGH (ref 0.61–1.24)
GFR, Estimated: 3 mL/min — ABNORMAL LOW (ref >60)
Glucose, Bld: 140 mg/dL — ABNORMAL HIGH (ref 70–99)
Potassium: 5.4 mmol/L — ABNORMAL HIGH (ref 3.5–5.1)
Sodium: 138 mmol/L (ref 135–145)

## 2022-08-05 LAB — CBC WITH DIFFERENTIAL/PLATELET
Abs Immature Granulocytes: 0.03 10*3/uL (ref 0.00–0.07)
Basophils Absolute: 0.1 10*3/uL (ref 0.0–0.1)
Basophils Relative: 1 %
Eosinophils Absolute: 0 10*3/uL (ref 0.0–0.5)
Eosinophils Relative: 0 %
HCT: 36.7 % — ABNORMAL LOW (ref 39.0–52.0)
Hemoglobin: 11.8 g/dL — ABNORMAL LOW (ref 13.0–17.0)
Immature Granulocytes: 1 %
Lymphocytes Relative: 19 %
Lymphs Abs: 1.1 10*3/uL (ref 0.7–4.0)
MCH: 31.5 pg (ref 26.0–34.0)
MCHC: 32.2 g/dL (ref 30.0–36.0)
MCV: 97.9 fL (ref 80.0–100.0)
Monocytes Absolute: 0.7 10*3/uL (ref 0.1–1.0)
Monocytes Relative: 12 %
Neutro Abs: 4 10*3/uL (ref 1.7–7.7)
Neutrophils Relative %: 67 %
Platelets: 214 10*3/uL (ref 150–400)
RBC: 3.75 MIL/uL — ABNORMAL LOW (ref 4.22–5.81)
RDW: 14.8 % (ref 11.5–15.5)
WBC: 5.8 10*3/uL (ref 4.0–10.5)
nRBC: 0 % (ref 0.0–0.2)

## 2022-08-05 LAB — HEPATITIS B SURFACE ANTIGEN: Hepatitis B Surface Ag: REACTIVE — AB

## 2022-08-05 MED ORDER — METHYLPREDNISOLONE SODIUM SUCC 40 MG IJ SOLR
40.0000 mg | Freq: Once | INTRAMUSCULAR | Status: AC
  Administered 2022-08-05: 40 mg via INTRAVENOUS
  Filled 2022-08-05: qty 1

## 2022-08-05 MED ORDER — SODIUM CHLORIDE 0.9% FLUSH: 3.0000 mL | INTRAVENOUS | Status: DC | PRN

## 2022-08-05 MED ORDER — HEPARIN SODIUM (PORCINE) 1000 UNIT/ML DIALYSIS
1000.0000 [IU] | INTRAMUSCULAR | Status: DC | PRN
  Filled 2022-08-05 (×2): qty 1

## 2022-08-05 MED ORDER — SODIUM ZIRCONIUM CYCLOSILICATE 10 G PO PACK
10.0000 g | PACK | Freq: Once | ORAL | Status: AC
  Administered 2022-08-05: 10 g via ORAL
  Filled 2022-08-05: qty 1

## 2022-08-05 MED ORDER — DIPHENHYDRAMINE HCL 50 MG/ML IJ SOLN: 50.0000 mg | Freq: Once | INTRAMUSCULAR | Status: AC

## 2022-08-05 MED ORDER — SODIUM BICARBONATE 650 MG PO TABS
650.0000 mg | ORAL_TABLET | Freq: Two times a day (BID) | ORAL | Status: DC
  Administered 2022-08-05 – 2022-08-06 (×2): 650 mg via ORAL
  Filled 2022-08-05 (×2): qty 1

## 2022-08-05 MED ORDER — LIDOCAINE HCL (PF) 1 % IJ SOLN: 5.0000 mL | INTRAMUSCULAR | Status: DC | PRN

## 2022-08-05 MED ORDER — HYDRALAZINE HCL 25 MG PO TABS
25.0000 mg | ORAL_TABLET | Freq: Four times a day (QID) | ORAL | Status: DC | PRN
  Administered 2022-08-05: 25 mg via ORAL
  Filled 2022-08-05: qty 1

## 2022-08-05 MED ORDER — SODIUM CHLORIDE 0.9 % IV SOLN: 250.0000 mL | INTRAVENOUS | Status: DC | PRN

## 2022-08-05 MED ORDER — METHYLPREDNISOLONE SODIUM SUCC 40 MG IJ SOLR
40.0000 mg | Freq: Once | INTRAMUSCULAR | Status: AC
  Administered 2022-08-06: 40 mg via INTRAVENOUS
  Filled 2022-08-05: qty 1

## 2022-08-05 MED ORDER — PENTAFLUOROPROP-TETRAFLUOROETH EX AERO: 1.0000 | INHALATION_SPRAY | CUTANEOUS | Status: DC | PRN

## 2022-08-05 MED ORDER — LIDOCAINE-PRILOCAINE 2.5-2.5 % EX CREA: 1.0000 | TOPICAL_CREAM | CUTANEOUS | Status: DC | PRN

## 2022-08-05 MED ORDER — ALTEPLASE 2 MG IJ SOLR: 2.0000 mg | Freq: Once | INTRAMUSCULAR | Status: DC | PRN

## 2022-08-05 MED ORDER — AMLODIPINE BESYLATE 10 MG PO TABS
10.0000 mg | ORAL_TABLET | Freq: Every day | ORAL | Status: DC
  Administered 2022-08-05 – 2022-08-06 (×2): 10 mg via ORAL
  Filled 2022-08-05: qty 1
  Filled 2022-08-05: qty 2

## 2022-08-05 MED ORDER — ANTICOAGULANT SODIUM CITRATE 4% (200MG/5ML) IV SOLN
5.0000 mL | Status: DC | PRN
  Filled 2022-08-05: qty 5

## 2022-08-05 MED ORDER — DIPHENHYDRAMINE HCL 25 MG PO CAPS
50.0000 mg | ORAL_CAPSULE | Freq: Once | ORAL | Status: AC
  Administered 2022-08-06: 50 mg via ORAL
  Filled 2022-08-05: qty 2

## 2022-08-05 MED ORDER — SODIUM CHLORIDE 0.9% FLUSH
3.0000 mL | Freq: Two times a day (BID) | INTRAVENOUS | Status: DC
  Administered 2022-08-05 – 2022-08-06 (×2): 3 mL via INTRAVENOUS

## 2022-08-05 MED ORDER — CLONIDINE HCL 0.1 MG PO TABS
0.1000 mg | ORAL_TABLET | Freq: Two times a day (BID) | ORAL | Status: DC
  Administered 2022-08-05: 0.1 mg via ORAL
  Filled 2022-08-05: qty 1

## 2022-08-05 MED ORDER — HEPARIN SODIUM (PORCINE) 5000 UNIT/ML IJ SOLN
5000.0000 [IU] | Freq: Two times a day (BID) | INTRAMUSCULAR | Status: DC
  Administered 2022-08-05 – 2022-08-06 (×2): 5000 [IU] via SUBCUTANEOUS
  Filled 2022-08-05 (×2): qty 1

## 2022-08-05 MED ORDER — ACETAMINOPHEN 325 MG PO TABS: 650.0000 mg | ORAL_TABLET | ORAL | Status: DC | PRN

## 2022-08-05 NOTE — Progress Notes (Signed)
IR aware of Mr Dillingham in ED with clotted AVG.  He will need dialysis line placement.  Due to both contrast allergy and difficult access, IR recommends admission overnight for premedication.  IR will see in am for formal consult and planned procedure.  Electronically Signed: Pasty Spillers, PA-C 08/05/2022, 4:42 PM

## 2022-08-05 NOTE — ED Notes (Signed)
RN attempted IV x2. [place] IV to be attempted.

## 2022-08-05 NOTE — Progress Notes (Addendum)
Renal Quick Note  S: Mr. Goldston is back with clotted R thigh AVG again - has been declotted 3 times in the past month. At this point, would deem the AVG unsalvageable.  O: Vitals:   08/05/22 1545 08/05/22 1630  BP: (!) 150/85 (!) 161/93  Pulse: (!) 55 68  Resp: 16 17  Temp:    SpO2: 100% 100%   No dyspnea or CP.   Labs K 5.4 and Lokelma 10g was ordered.  A/P:  Clotted R thigh AVG-unsalvageable at this time. Spoke to VVS and IR for line options. IR willing to place tunneled HD line tonight -- greatly appreciate IR for this. VVS will see him as an outpatient in the near future to determine what if any other options there are for dialysis access - he may end up being cath dependent with failed AVGs in all his limbs - has been on dialysis for 17 years. ESRD with mild hyperkalemia: Will dialyze him here after line placement and then he can be discharged afterwards.   Discussed the above with IR, VVS, ED PA, outpatient and inpatient HD units, and patient - all in agreement at this time.  Veneta Penton, PA-C [facility] Pager [phone]   ADDENDUM 4:45pm: IR now saying they will do the line tomorrow AM - will need OBS admit. 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 [place]

## 2022-08-05 NOTE — Progress Notes (Signed)
Called regarding clotted AVG. This has been thrombectomized multiple times over the last few months. 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 Unfortunately, the access should be abandoned at this point.  Happy to discuss new HD access which can be done in the outpatient setting. Recommend tunneled dialysis catheter placement.   Broadus John MD

## 2022-08-05 NOTE — H&P (Signed)
History and Physical    [name] QZR:[id number] DOB: 10-18-1970 DOA: 08/05/2022  PCP: Patient, No Pcp Per (Confirm with patient/family/NH records and if not entered, this has to be entered at [facility] The point of entry) Patient coming from: Home  I have personally briefly reviewed patient's old medical records in [place]  Chief Complaint: Feeling ok  HPI: Zimir Kittleson Gilpatrick is a 51 y.o. male with medical history significant of ESRD on HD MWF, HTN, chronic HFrEF, recurrent HD access issue with bilateral arm AV shunt closing status post thrombectomy and ballooning, presented with access issue HD center today.  Patient has been having his HD via a right groin AV graft on right groin, she was recently declotted 4 days ago.  However at HD center today, turnout the AV graft clotted again and patient sent to ED.  Patient has no complaints, no pain no leg swelling, no shortness of breath.  ED Course: Blood pressure slightly elevated, no tachycardia no hypoxia.  Blood work showed K5.4, creatinine 16, BUN 89.  IR consulted in the ED.  Review of Systems: As per HPI otherwise 14 point review of systems negative.    Past Medical History:  Diagnosis Date   Anemia    Anxiety    Depression    ESRD on hemodialysis ([place])    HD [facility] [place] [facility] MWF   Hypertension    Thyroid disease     Past Surgical History:  Procedure Laterality Date   ARTERIOVENOUS GRAFT PLACEMENT Left    "forearm, it's not working; thigh"    ARTERIOVENOUS GRAFT PLACEMENT Left 11/09/2015   AV FISTULA PLACEMENT Right 01/10/2017   Procedure: INSERTION OF ARTERIOVENOUS Right thigh GORE-TEX GRAFT;  Surgeon: [name], MD;  Location: [place];  Service: Vascular;  Laterality: Right;   FALSE ANEURYSM REPAIR Left 11/09/2015   Procedure: REPAIR OF LEFT FEMORAL PSEUDOANEURYSM; REVISION  OF LEFT THIGH ARTERIOVENOUS GRAFT USING 6MM X 10 CM GORETEX GRAFT ;  Surgeon: [name], MD;  Location: [place];  Service: Vascular;   Laterality: Left;   IR AV DIALY SHUNT INTRO NEEDLE/INTRACATH INITIAL W/PTA/IMG RIGHT Right 02/06/2019   IR AV DIALY SHUNT INTRO NEEDLE/INTRACATH INITIAL W/PTA/IMG RIGHT Right 05/14/2019   IR AV DIALY SHUNT INTRO NEEDLE/INTRACATH INITIAL W/PTA/IMG RIGHT Right 08/18/2020   IR AV DIALY SHUNT INTRO NEEDLE/INTRACATH INITIAL W/PTA/IMG RIGHT Right 04/21/2022   IR DIALY SHUNT INTRO NEEDLE/INTRACATH INITIAL W/IMG RIGHT Right 09/17/2019   IR DIALY SHUNT INTRO NEEDLE/INTRACATH INITIAL W/IMG RIGHT Right 12/10/2019   IR FLUORO GUIDE CV LINE RIGHT  07/10/2021   IR FLUORO GUIDE CV LINE RIGHT  06/27/2022   IR GENERIC HISTORICAL  07/15/2016   IR US GUIDE VASC ACCESS LEFT 07/15/2016 [name], MD [facility]   IR GENERIC HISTORICAL Left 07/15/2016   IR THROMBECTOMY AV FISTULA W/THROMBOLYSIS/PTA INC/SHUNT/IMG LEFT 07/15/2016 [name], MD [facility]   IR GENERIC HISTORICAL  10/05/2016   IR US GUIDE VASC ACCESS LEFT 10/05/2016 [name], MD [facility]   IR GENERIC HISTORICAL Left 10/05/2016   IR THROMBECTOMY AV FISTULA W/THROMBOLYSIS/PTA INC/SHUNT/IMG LEFT 10/05/2016 [name], MD [facility]   IR GENERIC HISTORICAL  10/08/2016   IR FLUORO GUIDE CV LINE RIGHT 10/08/2016 [name], MD [facility]   IR GENERIC HISTORICAL  10/08/2016   IR US GUIDE VASC ACCESS RIGHT 10/08/2016 [name], MD [facility]   IR PTA AND STENT ADDL CENTRAL DIALY SEG THRU DIALY CIRCUIT RIGHT Right 12/10/2019   IR RADIOLOGIST EVAL &  MGMT  11/26/2019   IR REMOVAL TUN CV CATH W/O FL  03/16/2017   IR REMOVAL TUN CV CATH W/O FL  07/22/2021   IR THROMBECTOMY AV FISTULA W/THROMBOLYSIS/PTA INC/SHUNT/IMG RIGHT Right 06/16/2018   IR THROMBECTOMY AV FISTULA W/THROMBOLYSIS/PTA INC/SHUNT/IMG RIGHT Right 06/17/2018   IR THROMBECTOMY AV FISTULA W/THROMBOLYSIS/PTA INC/SHUNT/IMG RIGHT Right 07/15/2021   IR THROMBECTOMY AV FISTULA W/THROMBOLYSIS/PTA INC/SHUNT/IMG RIGHT Right 06/29/2022   IR THROMBECTOMY AV FISTULA W/THROMBOLYSIS/PTA INC/SHUNT/IMG  RIGHT Right 07/05/2022   IR THROMBECTOMY AV FISTULA W/THROMBOLYSIS/PTA INC/SHUNT/IMG RIGHT Right 08/02/2022   IR US GUIDE VASC ACCESS RIGHT  06/17/2018   IR US GUIDE VASC ACCESS RIGHT  06/16/2018   IR US GUIDE VASC ACCESS RIGHT  02/06/2019   IR US GUIDE VASC ACCESS RIGHT  05/14/2019   IR US GUIDE VASC ACCESS RIGHT  09/17/2019   IR US GUIDE VASC ACCESS RIGHT  12/10/2019   IR US GUIDE VASC ACCESS RIGHT  08/18/2020   IR US GUIDE VASC ACCESS RIGHT  07/10/2021   IR US GUIDE VASC ACCESS RIGHT  07/19/2021   IR US GUIDE VASC ACCESS RIGHT  04/21/2022   IR US GUIDE VASC ACCESS RIGHT  06/27/2022   IR US GUIDE VASC ACCESS RIGHT  06/29/2022   IR US GUIDE VASC ACCESS RIGHT  07/05/2022   IR US GUIDE VASC ACCESS RIGHT  08/02/2022   PARATHYROIDECTOMY N/A 04/24/2014   Procedure: TOTAL 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;  Surgeon: [name], MD;  Location: [place];  Service: General;  Laterality: N/A;  NECK AND LEFT FOREARM   PERIPHERAL VASCULAR CATHETERIZATION N/A 05/05/2016   Procedure: A/V Shuntogram;  Surgeon: Conrad Porter, MD;  Location: [place] CV LAB;  Service: Cardiovascular;  Laterality: N/A;   PERIPHERAL VASCULAR CATHETERIZATION Left 05/05/2016   Procedure: Peripheral Vascular Balloon Angioplasty;  Surgeon: Conrad Fall Creek, MD;  Location: [place] CV LAB;  Service: Cardiovascular;  Laterality: Left;   PSEUDOANEURYSM REPAIR Left 11/09/2015   TEE WITHOUT CARDIOVERSION N/A 02/19/2021   Procedure: TRANSESOPHAGEAL ECHOCARDIOGRAM (TEE);  Surgeon: [name], MD;  Location: [facility] ENDOSCOPY;  Service: Cardiovascular;  Laterality: N/A;   THROMBECTOMY / ARTERIOVENOUS GRAFT REVISION Left 08/18/2015   thigh   THROMBECTOMY AND REVISION OF ARTERIOVENTOUS (AV) GORETEX  GRAFT Left 08/23/2015   Procedure: THROMBECTOMY AND REVISION OF ARTERIOVENTOUS (AV) GORETEX  GRAFT LEFT THIGH ;  Surgeon: [name], MD;  Location: [place];  Service: Vascular;  Laterality: Left;   THROMBECTOMY W/ EMBOLECTOMY Left  08/18/2015   Procedure: THROMBECTOMY  AND REVISION ARTERIOVENOUS GORE-TEX GRAFT/LEFT THIGH;  Surgeon: [name], MD;  Location: [place];  Service: Vascular;  Laterality: Left;   THROMBECTOMY W/ EMBOLECTOMY Right 06/19/2018   Procedure: THROMBECTOMY AND REVISION OF RIGHT THIGH  ARTERIOVENOUS GORE-TEX GRAFT;  Surgeon: [name], MD;  Location: [facility];  Service: Vascular;  Laterality: Right;   UPPER EXTREMITY VENOGRAPHY Bilateral 12/27/2016   Procedure: Upper Extremity Venography;  Surgeon: [name], MD;  Location: [place] CV LAB;  Service: Cardiovascular;  Laterality: Bilateral;   VENOGRAM Left 05/05/2016   Procedure: Venogram;  Surgeon: Conrad Wellman, MD;  Location: [place] CV LAB;  Service: Cardiovascular;  Laterality: Left;  lower extremity     reports that he quit smoking about 36 years ago. His smoking use included cigarettes. He has never used smokeless tobacco. He reports that he does not drink alcohol and does not use drugs.  Allergies  Allergen Reactions   Ivp Dye [Iodinated Contrast  Media] Swelling    SWELLING REACTION UNSPECIFIED    Lisinopril Cough   Solu-Medrol [Methylprednisolone] Nausea And Vomiting    Family History  Problem Relation Age of Onset   Renal Disease Neg Hx      Prior to Admission medications   Medication Sig Start Date End Date Taking? Authorizing Provider  acetaminophen (TYLENOL) 500 MG tablet Take 1,000 mg by mouth daily as needed (for pain or headaches).  Patient not taking: Reported on 07/19/2022    [provider]  amLODipine (NORVASC) 10 MG tablet Take 10 mg by mouth daily.    [provider]  B Complex-C-Folic Acid (DIALYVITE TABLET) TABS Take 1 tablet by mouth daily. Patient not taking: Reported on 07/19/2022 06/29/20   [provider]  cloNIDine (CATAPRES) 0.1 MG tablet Take 0.1 mg by mouth 2 (two) times daily.    [provider]  TUMS ULTRA 1000 1000 MG chewable tablet Chew 4,000 mg by  mouth 3 (three) times daily with meals. Patient not taking: Reported on 07/19/2022 03/14/20   [provider]  fluticasone (FLONASE) 50 MCG/ACT nasal spray Place 2 sprays into both nostrils daily. Patient not taking: Reported on 06/15/2018 07/25/17 02/20/20  Larene Pickett, PA-C    Physical Exam: Vitals:   08/05/22 1541 08/05/22 1544 08/05/22 1545 08/05/22 1630  BP:   (!) 150/85 (!) 161/93  Pulse: (!) 58  (!) 55 68  Resp:  19 16 17   Temp:      TempSrc:      SpO2: 100%  100% 100%  Weight:      Height:        Constitutional: NAD, calm, comfortable Vitals:   08/05/22 1541 08/05/22 1544 08/05/22 1545 08/05/22 1630  BP:   (!) 150/85 (!) 161/93  Pulse: (!) 58  (!) 55 68  Resp:  19 16 17   Temp:      TempSrc:      SpO2: 100%  100% 100%  Weight:      Height:       Eyes: PERRL, lids and conjunctivae normal ENMT: Mucous membranes are moist. Posterior pharynx clear of any exudate or lesions.Normal dentition.  Neck: normal, supple, no masses, no thyromegaly Respiratory: clear to auscultation bilaterally, no wheezing, no crackles. Normal respiratory effort. No accessory muscle use.  Cardiovascular: Regular rate and rhythm, no murmurs / rubs / gallops. No extremity edema. 2+ pedal pulses. No carotid bruits.  Abdomen: no tenderness, no masses palpated. No hepatosplenomegaly. Bowel sounds positive.  Musculoskeletal: no clubbing / cyanosis. No joint deformity upper and lower extremities. Good ROM, no contractures. Normal muscle tone.  Skin: no rashes, lesions, ulcers. No induration Neurologic: CN 2-12 grossly intact. Sensation intact, DTR normal. Strength 5/5 in all 4.  Psychiatric: Normal judgment and insight. Alert and oriented x 3. Normal mood.    Labs on Admission: I have personally reviewed following labs and imaging studies  CBC: Recent Labs  Lab 08/05/22 1029  WBC 5.8  NEUTROABS 4.0  HGB 11.8*  HCT 36.7*  MCV 97.9  PLT 390   Basic Metabolic Panel: Recent Labs   Lab 08/05/22 1029  NA 138  K 5.4*  CL 94*  CO2 21*  GLUCOSE 140*  BUN 89*  CREATININE 16.33*  CALCIUM 8.0*   GFR: Estimated Creatinine Clearance: 4.8 mL/min (A) (by C-G formula based on SCr of 16.33 mg/dL (H)). Liver Function Tests: No results for input(s): "AST", "ALT", "ALKPHOS", "BILITOT", "PROT", "ALBUMIN" in the last 168 hours. No results for  input(s): "LIPASE", "AMYLASE" in the last 168 hours. No results for input(s): "AMMONIA" in the last 168 hours. Coagulation Profile: No results for input(s): "INR", "PROTIME" in the last 168 hours. Cardiac Enzymes: No results for input(s): "CKTOTAL", "CKMB", "CKMBINDEX", "TROPONINI" in the last 168 hours. BNP (last 3 results) No results for input(s): "PROBNP" in the last 8760 hours. HbA1C: No results for input(s): "HGBA1C" in the last 72 hours. CBG: No results for input(s): "GLUCAP" in the last 168 hours. Lipid Profile: No results for input(s): "CHOL", "HDL", "LDLCALC", "TRIG", "CHOLHDL", "LDLDIRECT" in the last 72 hours. Thyroid Function Tests: No results for input(s): "TSH", "T4TOTAL", "FREET4", "T3FREE", "THYROIDAB" in the last 72 hours. Anemia Panel: No results for input(s): "VITAMINB12", "FOLATE", "FERRITIN", "TIBC", "IRON", "RETICCTPCT" in the last 72 hours. Urine analysis:    Component Value Date/Time   COLORURINE YELLOW 08/01/2009 0742   APPEARANCEUR TURBID (A) 08/01/2009 0742   LABSPEC 1.018 08/01/2009 0742   PHURINE 5.5 08/01/2009 0742   GLUCOSEU NEGATIVE 08/01/2009 0742   HGBUR LARGE (A) 08/01/2009 0742   BILIRUBINUR SMALL (A) 08/01/2009 0742   KETONESUR 15 (A) 08/01/2009 0742   PROTEINUR 100 (A) 08/01/2009 0742   UROBILINOGEN 1.0 08/01/2009 0742   NITRITE NEGATIVE 08/01/2009 0742   LEUKOCYTESUR LARGE (A) 08/01/2009 0742    Radiological Exams on Admission: No results found.  EKG: Pending  Assessment/Plan Principal Problem:   Hyperkalemia Active Problems:   Clotted dialysis access [facility])  (please  populate well all problems here in Problem List. (For example, if patient is on BP meds at home and you resume or decide to hold them, it is a problem that needs to be her. Same for CAD, COPD, HLD and so on)  Acute clotted dialysis access, recurrent -Discussed with IR physician, patient needs emergent declotting, but given the patient has history of IV contrast allergy, will need desensitization before our procedure.  Solu-Medrol every 6 hours, scheduled IR procedure tomorrow 10 AM. -Patient made aware, agreed with the plan.  Hyperkalemia -Lokelma x1, recheck level tomorrow -EKG  HTN -Controlled, continue amlodipine and clonidine.  ESRD on HD -Discussed with IR physician, plan for temporal HD access solution and patient has a follow-up with IR as outpatient for permanent access solution.  DVT prophylaxis: Heparin subcu Code Status: Full code Family Communication: None at bedside Disposition Plan: Expect less than 2 midnight hospital stay Consults called: IR and nephrology Admission status: Telemetry observation   Lequita Halt MD [facility] Pager [phone]  08/05/2022, 5:00 PM

## 2022-08-05 NOTE — ED Provider Triage Note (Signed)
Emergency Medicine Provider Triage Evaluation Note  Zacharie Portner Riggan , a 51 y.o. male  was evaluated in triage.  Pt complains of clogged dialysis port.  Last dialysis Wednesday, normally Monday Wednesday Friday.  Unable to be accessed today because it was clogged, last time he had by by IR.  Patient is asymptomatic.Marland Kitchen  Review of Systems  Per HPI  Physical Exam  BP 131/84 (BP Location: Right Arm)   Pulse 60   Temp 97.6 F (36.4 C)   Resp 16   SpO2 99%  Gen:   Awake, no distress   Resp:  Normal effort  MSK:   Moves extremities without difficulty  Other:    Medical Decision Making  Medically screening exam initiated at 10:18 AM.  Appropriate orders placed.  Jandiel Abdou Tsui was informed that the remainder of the evaluation will be completed by another provider, this initial triage assessment does not replace that evaluation, and the importance of remaining in the ED until their evaluation is complete.     Sherrill Raring, PA-C 08/05/22 1019

## 2022-08-05 NOTE — ED Provider Notes (Signed)
[facility] EMERGENCY DEPARTMENT Provider Note   CSN: [id number] Arrival date & time: 08/05/22  9373     History  Chief Complaint  Patient presents with   Vascular Access Problem    [name] is a 51 y.o. male.  HPI   Patient with medical history of end-stage renal disease on hemodialysis, hypertension, anxiety presents today due to problem with vascular access.  His last dialysis was on Wednesday, he is normally Monday Wednesday Friday.  He is not having any other symptoms, states this was clogged previously and has required multiple interventions by radiology.  Unfortunately does have a contrast allergy.  Home Medications Prior to Admission medications   Medication Sig Start Date End Date Taking? Authorizing Provider  acetaminophen (TYLENOL) 500 MG tablet Take 1,000 mg by mouth daily as needed (for pain or headaches).  Patient not taking: Reported on 07/19/2022    [provider]  amLODipine (NORVASC) 10 MG tablet Take 10 mg by mouth daily.    [provider]  B Complex-C-Folic Acid (DIALYVITE TABLET) TABS Take 1 tablet by mouth daily. Patient not taking: Reported on 07/19/2022 06/29/20   [provider]  cloNIDine (CATAPRES) 0.1 MG tablet Take 0.1 mg by mouth 2 (two) times daily.    [provider]  TUMS ULTRA 1000 1000 MG chewable tablet Chew 4,000 mg by mouth 3 (three) times daily with meals. Patient not taking: Reported on 07/19/2022 03/14/20   [provider]  fluticasone (FLONASE) 50 MCG/ACT nasal spray Place 2 sprays into both nostrils daily. Patient not taking: Reported on 06/15/2018 07/25/17 02/20/20  Larene Pickett, PA-C      Allergies    Ivp dye [iodinated contrast media], Lisinopril, and Solu-medrol [methylprednisolone]    Review of Systems   Review of Systems  Physical Exam Updated Vital Signs BP (!) 161/93   Pulse 68   Temp 97.6 F (36.4 C) (Oral)   Resp 17   Ht 6' (1.829 m)   Wt  63 kg   SpO2 100%   BMI 18.84 kg/m  Physical Exam Vitals and nursing note reviewed. Exam conducted with a chaperone present.  Constitutional:      Appearance: Normal appearance.  HENT:     Head: Normocephalic and atraumatic.  Eyes:     General: No scleral icterus.       Right eye: No discharge.        Left eye: No discharge.     Extraocular Movements: Extraocular movements intact.     Pupils: Pupils are equal, round, and reactive to light.  Cardiovascular:     Rate and Rhythm: Normal rate and regular rhythm.     Pulses: Normal pulses.     Heart sounds: Normal heart sounds. No murmur heard.    No friction rub. No gallop.  Pulmonary:     Effort: Pulmonary effort is normal. No respiratory distress.     Breath sounds: Normal breath sounds.  Abdominal:     General: Abdomen is flat. Bowel sounds are normal. There is no distension.     Palpations: Abdomen is soft.     Tenderness: There is no abdominal tenderness.  Skin:    General: Skin is warm and dry.     Coloration: Skin is not jaundiced.  Neurological:     Mental Status: He is alert. Mental status is at baseline.     Coordination: Coordination normal.     ED Results / Procedures / Treatments   Labs (  all labs ordered are listed, but only abnormal results are displayed) Labs Reviewed  BASIC METABOLIC PANEL - Abnormal; Notable for the following components:      Result Value   Potassium 5.4 (*)    Chloride 94 (*)    CO2 21 (*)    Glucose, Bld 140 (*)    BUN 89 (*)    Creatinine, Ser 16.33 (*)    Calcium 8.0 (*)    GFR, Estimated 3 (*)    Anion gap 23 (*)    All other components within normal limits  CBC WITH DIFFERENTIAL/PLATELET - Abnormal; Notable for the following components:   RBC 3.75 (*)    Hemoglobin 11.8 (*)    HCT 36.7 (*)    All other components within normal limits  HEPATITIS B SURFACE ANTIGEN  HEPATITIS B SURFACE ANTIBODY, QUANTITATIVE  BASIC METABOLIC PANEL    EKG None  Radiology No results  found.  Procedures Procedures    Medications Ordered in ED Medications  diphenhydrAMINE (BENADRYL) capsule 50 mg (has no administration in time range)    Or  diphenhydrAMINE (BENADRYL) injection 50 mg (has no administration in time range)  pentafluoroprop-tetrafluoroeth (GEBAUERS) aerosol 1 Application (has no administration in time range)  lidocaine (PF) (XYLOCAINE) 1 % injection 5 mL (has no administration in time range)  lidocaine-prilocaine (EMLA) cream 1 Application (has no administration in time range)  heparin injection 1,000 Units (has no administration in time range)  anticoagulant sodium citrate solution 5 mL (has no administration in time range)  alteplase (CATHFLO ACTIVASE) injection 2 mg (has no administration in time range)  amLODipine (NORVASC) tablet 10 mg (has no administration in time range)  cloNIDine (CATAPRES) tablet 0.1 mg (has no administration in time range)  sodium chloride flush (NS) 0.9 % injection 3 mL (has no administration in time range)  sodium chloride flush (NS) 0.9 % injection 3 mL (has no administration in time range)  0.9 %  sodium chloride infusion (has no administration in time range)  acetaminophen (TYLENOL) tablet 650 mg (has no administration in time range)  heparin injection 5,000 Units (has no administration in time range)  sodium bicarbonate tablet 650 mg (has no administration in time range)  methylPREDNISolone sodium succinate (SOLU-MEDROL) 40 mg/mL injection 40 mg (has no administration in time range)  methylPREDNISolone sodium succinate (SOLU-MEDROL) 40 mg/mL injection 40 mg (has no administration in time range)  methylPREDNISolone sodium succinate (SOLU-MEDROL) 40 mg/mL injection 40 mg (has no administration in time range)  sodium zirconium cyclosilicate (LOKELMA) packet 10 g (10 g Oral Given 08/05/22 1703)  methylPREDNISolone sodium succinate (SOLU-MEDROL) 40 mg/mL injection 40 mg (40 mg Intravenous Given 08/05/22 1734)    ED Course/  Medical Decision Making/ A&P Clinical Course as of 08/05/22 1735  Fri Aug 05, 2022  1553 I spoke with Dr. Soyla Murphy with nephrology.  He agrees with [facility] for the potassium.  States he will send a PA to come evaluate the patient but requesting we call IR to see if he can put in a temporary catheter placed patient will need dialysis over the weekend.  Request admission to hospitalist with nephrology to consult. [HS]  9767 Spoke with IR Dr. Pascal Lux with IR, they state this has clotted 3x in a month. Requesting vascular consult as IR feels this option has been exhausted.  [HS]  3419 [id number] with vascular.  They are about to go to surgery.  State the temporary tunneled line would need to be done by IR [HS]  1639  Spoke again with nephrology PA.  They are able to get IR to do the tunnel.  Agree with Milly Jakob but states they are actually able to dialyze the patient and they can follow-up outpatient with vascular in the office.  I spoke with Dr. Candiss Norse with hospitalist service for admission but given nephrology and IR plan to do this today and discharge patient no longer needs admission.  Will premedicate with Benadryl and solumedrol. [HS]  4431 Given premedication will take longer, can not be handled outpatient. Requesting admission. Spoke with Dr. Roosevelt Locks who agrees with admission.  [HS]    Clinical Course User Index [HS] Sherrill Raring, PA-C                           Medical Decision Making Amount and/or Complexity of Data Reviewed Labs: ordered.  Risk Prescription drug management. Decision regarding hospitalization.    Patient presents due to inability to access fistula.   I ordered, viewed and interpreted laboratory work-up.  Patient is mildly hyperkalemic at 5.4.  Creatinine is elevated, BUN is elevated consistent with needing dialysis.  No leukocytosis, stable anemia with a hemoglobin of 11.8.  I made consults as documented ED course.  Patient needs admission to obtain access and  dialyzed.         Final Clinical Impression(s) / ED Diagnoses Final diagnoses:  ESRD (end stage renal disease) on dialysis [facility])  Problem with vascular access    Rx / DC Orders ED Discharge Orders     None         Sherrill Raring, PA-C 08/05/22 1735    Valarie Merino, MD 08/07/22 1302

## 2022-08-05 NOTE — ED Triage Notes (Signed)
Pt arrived POV from home c/o not being able to access his dialysis fistula in his right leg. Pt normally has dialysis on M,W,F. Pt was able to complete a treatment on Wednesday but unable to get access today.

## 2022-08-05 NOTE — Plan of Care (Signed)

## 2022-08-06 ENCOUNTER — Observation Stay (HOSPITAL_COMMUNITY): Payer: Self-pay

## 2022-08-06 DIAGNOSIS — T8249XA Other complication of vascular dialysis catheter, initial encounter: Secondary | ICD-10-CM

## 2022-08-06 HISTORY — PX: IR US GUIDE VASC ACCESS RIGHT: IMG2390

## 2022-08-06 HISTORY — PX: IR FLUORO GUIDE CV LINE RIGHT: IMG2283

## 2022-08-06 LAB — BASIC METABOLIC PANEL
Anion gap: 17 — ABNORMAL HIGH (ref 5–15)
BUN: 107 mg/dL — ABNORMAL HIGH (ref 6–20)
CO2: 21 mmol/L — ABNORMAL LOW (ref 22–32)
Calcium: 7.6 mg/dL — ABNORMAL LOW (ref 8.9–10.3)
Chloride: 98 mmol/L (ref 98–111)
Creatinine, Ser: 19.36 mg/dL — ABNORMAL HIGH (ref 0.61–1.24)
GFR, Estimated: 3 mL/min — ABNORMAL LOW (ref >60)
Glucose, Bld: 170 mg/dL — ABNORMAL HIGH (ref 70–99)
Potassium: 6.3 mmol/L (ref 3.5–5.1)
Sodium: 136 mmol/L (ref 135–145)

## 2022-08-06 LAB — MRSA NEXT GEN BY PCR, NASAL: MRSA by PCR Next Gen: NOT DETECTED

## 2022-08-06 MED ORDER — SODIUM ZIRCONIUM CYCLOSILICATE 10 G PO PACK
10.0000 g | PACK | Freq: Three times a day (TID) | ORAL | Status: DC
  Administered 2022-08-06: 10 g via ORAL
  Filled 2022-08-06 (×2): qty 1

## 2022-08-06 MED ORDER — SODIUM ZIRCONIUM CYCLOSILICATE 10 G PO PACK
10.0000 g | PACK | ORAL | Status: AC
  Administered 2022-08-06: 10 g via ORAL
  Filled 2022-08-06: qty 1

## 2022-08-06 MED ORDER — SODIUM ZIRCONIUM CYCLOSILICATE 10 G PO PACK
10.0000 g | PACK | ORAL | Status: AC
  Administered 2022-08-06: 10 g via ORAL

## 2022-08-06 MED ORDER — HEPARIN SODIUM (PORCINE) 1000 UNIT/ML IJ SOLN
INTRAMUSCULAR | Status: AC
  Administered 2022-08-06: 3800 [IU]
  Filled 2022-08-06: qty 10

## 2022-08-06 MED ORDER — MIDAZOLAM HCL 2 MG/2ML IJ SOLN
INTRAMUSCULAR | Status: AC
  Filled 2022-08-06: qty 4

## 2022-08-06 MED ORDER — LIDOCAINE HCL 1 % IJ SOLN
INTRAMUSCULAR | Status: AC
  Administered 2022-08-06: 10 mL via SUBCUTANEOUS
  Filled 2022-08-06: qty 20

## 2022-08-06 MED ORDER — HEPARIN SODIUM (PORCINE) 1000 UNIT/ML IJ SOLN
INTRAMUSCULAR | Status: AC
  Administered 2022-08-06: 3800 [IU]
  Filled 2022-08-06: qty 4

## 2022-08-06 MED ORDER — CEFAZOLIN SODIUM-DEXTROSE 2-4 GM/100ML-% IV SOLN
INTRAVENOUS | Status: DC | PRN
  Administered 2022-08-06: 2 g via INTRAVENOUS

## 2022-08-06 MED ORDER — FENTANYL CITRATE (PF) 100 MCG/2ML IJ SOLN
INTRAMUSCULAR | Status: AC
  Filled 2022-08-06: qty 4

## 2022-08-06 MED ORDER — CEFAZOLIN SODIUM-DEXTROSE 2-4 GM/100ML-% IV SOLN
INTRAVENOUS | Status: AC
  Administered 2022-08-06: 2 g via INTRAVENOUS
  Filled 2022-08-06: qty 100

## 2022-08-06 MED ORDER — FENTANYL CITRATE (PF) 100 MCG/2ML IJ SOLN
INTRAMUSCULAR | Status: DC | PRN
  Administered 2022-08-06 (×4): 50 ug via INTRAVENOUS

## 2022-08-06 MED ORDER — LIDOCAINE-EPINEPHRINE 1 %-1:100000 IJ SOLN
INTRAMUSCULAR | Status: AC
  Filled 2022-08-06: qty 1

## 2022-08-06 MED ORDER — INSULIN ASPART 100 UNIT/ML IV SOLN
10.0000 [IU] | INTRAVENOUS | Status: AC
  Administered 2022-08-06: 10 [IU] via INTRAVENOUS

## 2022-08-06 MED ORDER — MIDAZOLAM HCL 2 MG/2ML IJ SOLN
INTRAMUSCULAR | Status: DC | PRN
  Administered 2022-08-06 (×2): 1 mg via INTRAVENOUS

## 2022-08-06 MED ORDER — CALCIUM GLUCONATE-NACL 1-0.675 GM/50ML-% IV SOLN
1.0000 g | Freq: Once | INTRAVENOUS | Status: AC
  Administered 2022-08-06: 1000 mg via INTRAVENOUS
  Filled 2022-08-06: qty 50

## 2022-08-06 MED ORDER — DEXTROSE 50 % IV SOLN
50.0000 mL | INTRAVENOUS | Status: AC
  Administered 2022-08-06: 50 mL via INTRAVENOUS
  Filled 2022-08-06: qty 50

## 2022-08-06 MED ORDER — FLUMAZENIL 0.5 MG/5ML IV SOLN
INTRAVENOUS | Status: AC
  Filled 2022-08-06: qty 5

## 2022-08-06 MED ORDER — NALOXONE HCL 0.4 MG/ML IJ SOLN
INTRAMUSCULAR | Status: AC
  Filled 2022-08-06: qty 1

## 2022-08-06 NOTE — Consult Note (Addendum)
Chief Complaint: Patient was seen in consultation today for  Chief Complaint  Patient presents with   Vascular Access Problem   at the request of Nephrology  Referring Physician(s): Stephania Fragmin, PA-C  Supervising Physician: [name]  Patient Status: [facility] - In-pt  History of Present Illness: [name] is a 51 y.o. male ESRD on HD with limited vascular access.  He is well known to IR.  He currently uses a right thigh graft which has required multiple declots.  His most recent declot was on 10/31 with Dr. Kathlene Cote.  It worked for dialysis on Wednesday 11/1, but was no longer functioning when attempting dialysis yesterday, 11/3.  He was sent to the ED.  Vascular Surgery was contacted who has agreed to see the patient in an outpatient setting.  IR consulted for intermediate dialysis access. Patient has no physical complaints this morning.  He does voice frustration about his graft continuing to clot and concern for the security of his job due to the multiple appointments and hospitalizations required.  Past Medical History:  Diagnosis Date   Anemia    Anxiety    Depression    ESRD on hemodialysis ([place])    HD [place] MWF   Hypertension    Thyroid disease     Past Surgical History:  Procedure Laterality Date   ARTERIOVENOUS GRAFT PLACEMENT Left    "forearm, it's not working; thigh"    ARTERIOVENOUS GRAFT PLACEMENT Left 11/09/2015   AV FISTULA PLACEMENT Right 01/10/2017   Procedure: INSERTION OF ARTERIOVENOUS Right thigh GORE-TEX GRAFT;  Surgeon: [name], MD;  Location: [place];  Service: Vascular;  Laterality: Right;   FALSE ANEURYSM REPAIR Left 11/09/2015   Procedure: REPAIR OF LEFT FEMORAL PSEUDOANEURYSM; REVISION  OF LEFT THIGH ARTERIOVENOUS GRAFT USING 6MM X 10 CM GORETEX GRAFT ;  Surgeon: [name], MD;  Location: [facility] OR;  Service: Vascular;  Laterality: Left;   IR AV DIALY SHUNT INTRO NEEDLE/INTRACATH INITIAL W/PTA/IMG RIGHT Right 02/06/2019    IR AV DIALY SHUNT INTRO NEEDLE/INTRACATH INITIAL W/PTA/IMG RIGHT Right 05/14/2019   IR AV DIALY SHUNT INTRO NEEDLE/INTRACATH INITIAL W/PTA/IMG RIGHT Right 08/18/2020   IR AV DIALY SHUNT INTRO NEEDLE/INTRACATH INITIAL W/PTA/IMG RIGHT Right 04/21/2022   IR DIALY SHUNT INTRO NEEDLE/INTRACATH INITIAL W/IMG RIGHT Right 09/17/2019   IR DIALY SHUNT INTRO NEEDLE/INTRACATH INITIAL W/IMG RIGHT Right 12/10/2019   IR FLUORO GUIDE CV LINE RIGHT  07/10/2021   IR FLUORO GUIDE CV LINE RIGHT  06/27/2022   IR GENERIC HISTORICAL  07/15/2016   IR US GUIDE VASC ACCESS LEFT 07/15/2016 [name], MD [facility]   IR GENERIC HISTORICAL Left 07/15/2016   IR THROMBECTOMY AV FISTULA W/THROMBOLYSIS/PTA INC/SHUNT/IMG LEFT 07/15/2016 [name], MD [facility]   IR GENERIC HISTORICAL  10/05/2016   IR US GUIDE VASC ACCESS LEFT 10/05/2016 [name], MD [facility]   IR GENERIC HISTORICAL Left 10/05/2016   IR THROMBECTOMY AV FISTULA W/THROMBOLYSIS/PTA INC/SHUNT/IMG LEFT 10/05/2016 [name], MD [facility]   IR GENERIC HISTORICAL  10/08/2016   IR FLUORO GUIDE CV LINE RIGHT 10/08/2016 [name], MD [facility]   IR GENERIC HISTORICAL  10/08/2016   IR US GUIDE VASC ACCESS RIGHT 10/08/2016 [name], MD [facility]   IR PTA AND STENT ADDL CENTRAL DIALY SEG THRU DIALY CIRCUIT RIGHT Right 12/10/2019   IR RADIOLOGIST EVAL & MGMT  11/26/2019   IR REMOVAL TUN CV CATH W/O FL  03/16/2017   IR REMOVAL TUN CV CATH  W/O FL  07/22/2021   IR THROMBECTOMY AV FISTULA W/THROMBOLYSIS/PTA INC/SHUNT/IMG RIGHT Right 06/16/2018   IR THROMBECTOMY AV FISTULA W/THROMBOLYSIS/PTA INC/SHUNT/IMG RIGHT Right 06/17/2018   IR THROMBECTOMY AV FISTULA W/THROMBOLYSIS/PTA INC/SHUNT/IMG RIGHT Right 07/15/2021   IR THROMBECTOMY AV FISTULA W/THROMBOLYSIS/PTA INC/SHUNT/IMG RIGHT Right 06/29/2022   IR THROMBECTOMY AV FISTULA W/THROMBOLYSIS/PTA INC/SHUNT/IMG RIGHT Right 07/05/2022   IR THROMBECTOMY AV FISTULA W/THROMBOLYSIS/PTA INC/SHUNT/IMG RIGHT Right  08/02/2022   IR US GUIDE VASC ACCESS RIGHT  06/17/2018   IR US GUIDE VASC ACCESS RIGHT  06/16/2018   IR US GUIDE VASC ACCESS RIGHT  02/06/2019   IR US GUIDE VASC ACCESS RIGHT  05/14/2019   IR US GUIDE VASC ACCESS RIGHT  09/17/2019   IR US GUIDE VASC ACCESS RIGHT  12/10/2019   IR US GUIDE VASC ACCESS RIGHT  08/18/2020   IR US GUIDE VASC ACCESS RIGHT  07/10/2021   IR US GUIDE VASC ACCESS RIGHT  07/19/2021   IR US GUIDE VASC ACCESS RIGHT  04/21/2022   IR US GUIDE VASC ACCESS RIGHT  06/27/2022   IR US GUIDE VASC ACCESS RIGHT  06/29/2022   IR US GUIDE VASC ACCESS RIGHT  07/05/2022   IR US GUIDE VASC ACCESS RIGHT  08/02/2022   PARATHYROIDECTOMY N/A 04/24/2014   Procedure: TOTAL 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;  Surgeon: [name], MD;  Location: [place];  Service: General;  Laterality: N/A;  NECK AND LEFT FOREARM   PERIPHERAL VASCULAR CATHETERIZATION N/A 05/05/2016   Procedure: A/V Shuntogram;  Surgeon: Conrad [place], MD;  Location: [place] [facility];  Service: Cardiovascular;  Laterality: N/A;   PERIPHERAL VASCULAR CATHETERIZATION Left 05/05/2016   Procedure: Peripheral Vascular Balloon Angioplasty;  Surgeon: Conrad Colburn, MD;  Location: [place] [facility];  Service: Cardiovascular;  Laterality: Left;   PSEUDOANEURYSM REPAIR Left 11/09/2015   TEE WITHOUT CARDIOVERSION N/A 02/19/2021   Procedure: TRANSESOPHAGEAL ECHOCARDIOGRAM (TEE);  Surgeon: [name], MD;  Location: [facility] ENDOSCOPY;  Service: Cardiovascular;  Laterality: N/A;   THROMBECTOMY / ARTERIOVENOUS GRAFT REVISION Left 08/18/2015   thigh   THROMBECTOMY AND REVISION OF ARTERIOVENTOUS (AV) GORETEX  GRAFT Left 08/23/2015   Procedure: THROMBECTOMY AND REVISION OF ARTERIOVENTOUS (AV) GORETEX  GRAFT LEFT THIGH ;  Surgeon: [name], MD;  Location: [place];  Service: Vascular;  Laterality: Left;   THROMBECTOMY W/ EMBOLECTOMY Left 08/18/2015   Procedure: THROMBECTOMY  AND REVISION ARTERIOVENOUS GORE-TEX GRAFT/LEFT THIGH;   Surgeon: [name], MD;  Location: [place];  Service: Vascular;  Laterality: Left;   THROMBECTOMY W/ EMBOLECTOMY Right 06/19/2018   Procedure: THROMBECTOMY AND REVISION OF RIGHT THIGH  ARTERIOVENOUS GORE-TEX GRAFT;  Surgeon: [name], MD;  Location: [facility] OR;  Service: Vascular;  Laterality: Right;   UPPER EXTREMITY VENOGRAPHY Bilateral 12/27/2016   Procedure: Upper Extremity Venography;  Surgeon: [name], MD;  Location: [place] [facility];  Service: Cardiovascular;  Laterality: Bilateral;   VENOGRAM Left 05/05/2016   Procedure: Venogram;  Surgeon: Conrad [place]en [place], MD;  Location: [place] [facility];  Service: Cardiovascular;  Laterality: Left;  lower extremity    Allergies: Ivp dye [iodinated contrast media], Lisinopril, and Solu-medrol [methylprednisolone]  Medications: Prior to Admission medications   Medication Sig Start Date End Date Taking? Authorizing Provider  acetaminophen (TYLENOL) 500 MG tablet Take 1,000 mg by mouth daily as needed (for pain or headaches).  Patient not taking: Reported on 07/19/2022    [provider]  amLODipine (NORVASC) 10 MG tablet Take 10  mg by mouth daily.    [provider]  B Complex-C-Folic Acid (DIALYVITE TABLET) TABS Take 1 tablet by mouth daily. Patient not taking: Reported on 07/19/2022 06/29/20   [provider]  cloNIDine (CATAPRES) 0.1 MG tablet Take 0.1 mg by mouth 2 (two) times daily.    [provider]  TUMS ULTRA 1000 1000 MG chewable tablet Chew 4,000 mg by mouth 3 (three) times daily with meals. Patient not taking: Reported on 07/19/2022 03/14/20   [provider]  fluticasone (FLONASE) 50 MCG/ACT nasal spray Place 2 sprays into both nostrils daily. Patient not taking: Reported on 06/15/2018 07/25/17 02/20/20  Larene Pickett, PA-C     Family History  Problem Relation Age of Onset   Renal Disease Neg Hx     Social History   Socioeconomic History   Marital status: Married     Spouse name: Not on file   Number of children: Not on file   Years of education: Not on file   Highest education level: Not on file  Occupational History   Not on file  Tobacco Use   Smoking status: Former    Types: Cigarettes    Quit date: 03/21/1986    Years since quitting: 36.4   Smokeless tobacco: Never  Vaping Use   Vaping Use: Never used  Substance and Sexual Activity   Alcohol use: No    Alcohol/week: 0.0 standard drinks of alcohol   Drug use: No   Sexual activity: Not on file  Other Topics Concern   Not on file  Social History Narrative   Not on file   Social Determinants of Health   Financial Resource Strain: Not on file  Food Insecurity: No Food Insecurity (08/05/2022)   Hunger Vital Sign    Worried About Running Out of Food in the Last Year: Never true    Ran Out of Food in the Last Year: Never true  Transportation Needs: No Transportation Needs (08/05/2022)   PRAPARE - Hydrologist (Medical): No    Lack of Transportation (Non-Medical): No  Physical Activity: Not on file  Stress: Not on file  Social Connections: Not on file    Review of Systems: A 12 point ROS discussed and pertinent positives are indicated in the HPI above.  All other systems are negative.  Vital Signs: BP 122/79 (BP Location: Left Arm)   Pulse (!) 53   Temp 98 F (36.7 C) (Oral)   Resp 18   Ht 6' (1.829 m)   Wt 139 lb 8.8 oz (63.3 kg)   SpO2 96%   BMI 18.93 kg/m   Physical Exam Vitals reviewed.  Constitutional:      General: He is not in acute distress. HENT:     Head: Normocephalic and atraumatic.     Mouth/Throat:     Mouth: Mucous membranes are dry.     Pharynx: Oropharynx is clear.  Eyes:     Extraocular Movements: Extraocular movements intact.  Cardiovascular:     Rate and Rhythm: Bradycardia present.     Pulses: Normal pulses.  Pulmonary:     Effort: Pulmonary effort is normal. No respiratory distress.  Abdominal:     General: Abdomen  is flat.     Palpations: Abdomen is soft.  Skin:    General: Skin is warm and dry.  Neurological:     General: No focal deficit present.     Mental Status: He is alert and oriented to person,  place, and time.  Psychiatric:        Mood and Affect: Mood normal.        Behavior: Behavior normal.     Imaging: DG Chest 1 View  Result Date: 08/06/2022 CLINICAL DATA:  51 year old male with history of congestive heart failure. EXAM: CHEST  1 VIEW COMPARISON:  Chest x-ray 07/02/2022. FINDINGS: Lung volumes are normal. No consolidative airspace disease. No pleural effusions. No pneumothorax. No pulmonary nodule or mass noted. Pulmonary vasculature and the cardiomediastinal silhouette are within normal limits. Surgical clips are noted at the level of the thoracic inlet, potentially from prior thyroidectomy. IMPRESSION: No radiographic evidence of acute cardiopulmonary disease. Electronically Signed   By: [name] M.D.   On: 08/06/2022 05:59   IR THROMBECTOMY AV FISTULA/W THROMBOLYSIS/PTA INC SHUNT/IMG RIGHT  Result Date: 08/02/2022 INDICATION: Occluded right thigh hemodialysis dialysis graft. Status post D cough procedures of this right thigh access on 07/05/2022, and 06/29/2022. Additional interventions this year to dilate venous outflow on 04/21/2022 and 12/20/2021. EXAM: 1. ULTRASOUND GUIDANCE FOR VASCULAR ACCESS RIGHT THIGH DIALYSIS GRAFT 2. RIGHT THIGH DIALYSIS GRAFT THROMBECTOMY INCLUDING VENOUS ANGIOPLASTY MEDICATIONS: 1 mg tPA and 3000 units intravenous heparin ANESTHESIA/SEDATION: Moderate (conscious) sedation was employed during this procedure. A total of Versed 4.0 mg and Fentanyl 200 mcg was administered intravenously by radiology nursing. Moderate Sedation Time: 82 minutes. The patient's level of consciousness and vital signs were monitored continuously by radiology nursing throughout the procedure under my direct supervision. FLUOROSCOPY TIME:  Fluoroscopy Time: 19 minutes and 24  seconds. 34.0 mGy. COMPLICATIONS: None immediate. PROCEDURE: Informed written consent was obtained from the patient after a thorough discussion of the procedural risks, benefits and alternatives. All questions were addressed. Maximal Sterile Barrier Technique was utilized including caps, mask, sterile gowns, sterile gloves, sterile drape, hand hygiene and skin antiseptic. Local anesthesia was provided with 1% lidocaine. A timeout was performed prior to the initiation of the procedure. Antegrade access of the clotted right thigh dialysis graft was performed under direct ultrasound guidance with a micropuncture set. 1 mg of tPA was instilled via the micropuncture dilator. Ultrasound image documentation was performed and recorded. A 7 French antegrade sheath was placed. A 5 French catheter was advanced over a guidewire to the level of venous outflow in the right external iliac vein. Outflow venography was performed to the level of the upper inferior vena cava. Balloon angioplasty was performed at the level of a venous outflow stent located in the right external and common iliac veins with a 12 mm x 4 cm Atlas balloon. Balloon angioplasty was performed across the venous anastomosis of the graft with a 7 mm x 4 cm Conquest balloon. Balloon dilatation was also performed within the dialysis graft with the 7 mm balloon. Mechanical thrombectomy was performed within the dialysis graft with a Cleaner rotational thrombectomy device. Retrograde access of the dialysis graft was performed with a micropuncture set under ultrasound guidance. A 6 French retrograde sheath was placed. Thrombectomy across the arterial anastomosis and within the arterial limb of the dialysis graft was performed with a 4 [place] Fogarty balloon catheter. Additional angiography was performed to evaluate patency of the graft. Balloon angioplasty was performed at the level of the right external iliac vein with the 12 mm balloon. Additional balloon angioplasty  was performed across the venous anastomosis of the dialysis graft with the common femoral vein with an 8 mm x 4 cm Conquest balloon. Completion angiography of the graft and venous outflow was performed through  the antegrade sheath as well as through a retrograde 5 French catheter placed up to the level of the arterial anastomosis. Both sheaths were removed and hemostasis obtained with application Ethilon pursestring sutures. FINDINGS: Recurrent stenoses at the level of the venous anastomosis, outflow external iliac vein and within the stented segment of the right external and common iliac veins were again noted. None of these appeared extremely high grade with the most significant stenosis likely at the level of the venous anastomosis followed by an external iliac vein stenosis beginning just below the level of the indwelling stent and extending into the bottom end of the stent. After reestablishing graft patency, multiple graft pseudoaneurysms are again noted diffusely along the arterial limb as well as 2 large pseudoaneurysms in the venous limb just beyond the apex. Pseudoaneurysms could also be contributing to recurrent graft thrombosis. The arterial anastomosis did not appear stenotic by angiography or ultrasound. After interventions, the only residual stenosis remaining is mild stenosis at the juncture of the common femoral vein and external iliac vein of approximately 30% narrowing. IMPRESSION: The right thigh graft was again able to be opened with thrombectomy and multiple levels of venous angioplasty. Exact cause frequent graft occlusion is not very clear and likely multifactorial. Graft pseudoaneurysms may be playing a role in causing slow flow and eventual thrombus formation within the pseudoaneurysms followed by the graft itself. ACCESS: This is the third thrombectomy procedure for this access in the last 5 weeks. It is becoming apparent that percutaneous declot procedures will not be effective long-term.  If the graft becomes occluded again before January, 2024, vascular surgical consultation is recommended for potential graft revision versus new access placement. Electronically Signed   By: Aletta Edouard M.D.   On: 08/02/2022 17:16   IR US Guide Vasc Access Right  Result Date: 08/02/2022 INDICATION: Occluded right thigh hemodialysis dialysis graft. Status post D cough procedures of this right thigh access on 07/05/2022, and 06/29/2022. Additional interventions this year to dilate venous outflow on 04/21/2022 and 12/20/2021. EXAM: 1. ULTRASOUND GUIDANCE FOR VASCULAR ACCESS RIGHT THIGH DIALYSIS GRAFT 2. RIGHT THIGH DIALYSIS GRAFT THROMBECTOMY INCLUDING VENOUS ANGIOPLASTY MEDICATIONS: 1 mg tPA and 3000 units intravenous heparin ANESTHESIA/SEDATION: Moderate (conscious) sedation was employed during this procedure. A total of Versed 4.0 mg and Fentanyl 200 mcg was administered intravenously by radiology nursing. Moderate Sedation Time: 82 minutes. The patient's level of consciousness and vital signs were monitored continuously by radiology nursing throughout the procedure under my direct supervision. FLUOROSCOPY TIME:  Fluoroscopy Time: 19 minutes and 24 seconds. 34.0 mGy. COMPLICATIONS: None immediate. PROCEDURE: Informed written consent was obtained from the patient after a thorough discussion of the procedural risks, benefits and alternatives. All questions were addressed. Maximal Sterile Barrier Technique was utilized including caps, mask, sterile gowns, sterile gloves, sterile drape, hand hygiene and skin antiseptic. Local anesthesia was provided with 1% lidocaine. A timeout was performed prior to the initiation of the procedure. Antegrade access of the clotted right thigh dialysis graft was performed under direct ultrasound guidance with a micropuncture set. 1 mg of tPA was instilled via the micropuncture dilator. Ultrasound image documentation was performed and recorded. A 7 French antegrade sheath was  placed. A 5 French catheter was advanced over a guidewire to the level of venous outflow in the right external iliac vein. Outflow venography was performed to the level of the upper inferior vena cava. Balloon angioplasty was performed at the level of a venous outflow stent located in the right external and  common iliac veins with a 12 mm x 4 cm Atlas balloon. Balloon angioplasty was performed across the venous anastomosis of the graft with a 7 mm x 4 cm Conquest balloon. Balloon dilatation was also performed within the dialysis graft with the 7 mm balloon. Mechanical thrombectomy was performed within the dialysis graft with a Cleaner rotational thrombectomy device. Retrograde access of the dialysis graft was performed with a micropuncture set under ultrasound guidance. A 6 French retrograde sheath was placed. Thrombectomy across the arterial anastomosis and within the arterial limb of the dialysis graft was performed with a 4 [place] Fogarty balloon catheter. Additional angiography was performed to evaluate patency of the graft. Balloon angioplasty was performed at the level of the right external iliac vein with the 12 mm balloon. Additional balloon angioplasty was performed across the venous anastomosis of the dialysis graft with the common femoral vein with an 8 mm x 4 cm Conquest balloon. Completion angiography of the graft and venous outflow was performed through the antegrade sheath as well as through a retrograde 5 French catheter placed up to the level of the arterial anastomosis. Both sheaths were removed and hemostasis obtained with application Ethilon pursestring sutures. FINDINGS: Recurrent stenoses at the level of the venous anastomosis, outflow external iliac vein and within the stented segment of the right external and common iliac veins were again noted. None of these appeared extremely high grade with the most significant stenosis likely at the level of the venous anastomosis followed by an external  iliac vein stenosis beginning just below the level of the indwelling stent and extending into the bottom end of the stent. After reestablishing graft patency, multiple graft pseudoaneurysms are again noted diffusely along the arterial limb as well as 2 large pseudoaneurysms in the venous limb just beyond the apex. Pseudoaneurysms could also be contributing to recurrent graft thrombosis. The arterial anastomosis did not appear stenotic by angiography or ultrasound. After interventions, the only residual stenosis remaining is mild stenosis at the juncture of the common femoral vein and external iliac vein of approximately 30% narrowing. IMPRESSION: The right thigh graft was again able to be opened with thrombectomy and multiple levels of venous angioplasty. Exact cause frequent graft occlusion is not very clear and likely multifactorial. Graft pseudoaneurysms may be playing a role in causing slow flow and eventual thrombus formation within the pseudoaneurysms followed by the graft itself. ACCESS: This is the third thrombectomy procedure for this access in the last 5 weeks. It is becoming apparent that percutaneous declot procedures will not be effective long-term. If the graft becomes occluded again before January, 2024, vascular surgical consultation is recommended for potential graft revision versus new access placement. Electronically Signed   By: Aletta Edouard M.D.   On: 08/02/2022 17:16    Labs:  CBC: Recent Labs    07/02/22 0948 07/02/22 0951 07/02/22 0952 07/04/22 1449 07/05/22 0117 08/05/22 1029  WBC 6.9  --   --  7.4 6.4 5.8  HGB 16.2   < > 17.7* 13.0 11.0* 11.8*  HCT 47.5   < > 52.0 40.8 31.8* 36.7*  PLT 234  --   --  288 254 214   < > = values in this interval not displayed.    COAGS: Recent Labs    03/26/22 1755 06/29/22 0135 07/02/22 0948  INR 1.0 0.9 0.9  APTT 33  --  31    BMP: Recent Labs    07/04/22 2124 07/05/22 0117 08/05/22 1029 08/06/22 0340  NA 132*  132* 138  136  K 5.3* 4.9 5.4* 6.3*  CL 83* 83* 94* 98  CO2 19* 23 21* 21*  GLUCOSE 181* 275* 140* 170*  BUN 119* 120* 89* 107*  CALCIUM 8.0* 7.4* 8.0* 7.6*  CREATININE 20.50* 20.31* 16.33* 19.36*  GFRNONAA 2* 2* 3* 3*    LIVER FUNCTION TESTS: Recent Labs    03/26/22 1755 06/27/22 1029 06/27/22 2023 06/30/22 0511 07/02/22 0948 07/04/22 1449 07/05/22 0117  BILITOT 0.8 0.8  --   --  0.6 0.8  --   AST 23 13*  --   --  24 17  --   ALT 16 14  --   --  18 14  --   ALKPHOS 50 65  --   --  68 64  --   PROT 7.6 9.1*  --   --  9.7* 8.1  --   ALBUMIN 3.8 4.2   < > 3.3* 4.5 4.0 3.4*   < > = values in this interval not displayed.    TUMOR MARKERS: No results for input(s): "AFPTM", "CEA", "CA199", "CHROMGRNA" in the last 8760 hours.  Assessment and Plan:  ESRD on HD --malfunctioning right thigh AV graft, believed to be clotted --multiple previous interventions --Case reviewed by Dr. Pascal Lux who agrees to proceed with fistulogram with planned declot or other intervention, and possible dialysis catheter placement. --Pt is appropriately NPO, and will complete his contrast premedication at 0900 today.  Risks and benefits discussed with the patient including, but not limited to bleeding, infection, vascular injury, pulmonary embolism, need for tunneled HD catheter placement or even death.  All of the patient's questions were answered, patient is agreeable to proceed. Consent signed and in IR.   Thank you for this interesting consult.  I greatly enjoyed meeting [name] and look forward to participating in their care.  A copy of this report was sent to the requesting provider on this date.  Electronically Signed: Pasty Spillers, PA 08/06/2022, 8:40 AM   I spent a total of  25 minutes  in face to face in clinical consultation, greater than 50% of which was counseling/coordinating care for dialysis access.

## 2022-08-06 NOTE — Procedures (Signed)
Pre-procedure Diagnosis: ESRD Post-procedure Diagnosis: Same  Successful placement of tunneled HD catheter with tips terminating within the superior aspect of the right atrium.    Complications: None Immediate EBL: Trace  The catheter is ready for immediate use.   Jay Kashaun Bebo, MD Pager #: [phone]   

## 2022-08-06 NOTE — Progress Notes (Signed)
DISCHARGE NOTE HOME [name] to be discharged  Home per MD order. Discussed prescriptions and follow up appointments with the patient. Prescriptions given to patient; medication list explained in detail. Patient verbalized understanding.  Skin clean, dry and intact without evidence of skin break down, no evidence of skin tears noted. IV catheter discontinued intact. Site without signs and symptoms of complications. Dressing and pressure applied. Pt denies pain at the site currently. No complaints noted.  Patient free of drains, and wounds.   An After Visit Summary (AVS) was printed and given to the patient. Patient escorted via wheelchair, and discharged home via private auto.  Rushie Goltz, RN

## 2022-08-06 NOTE — Plan of Care (Signed)

## 2022-08-06 NOTE — Progress Notes (Signed)
Critical Lab Potassium-6.3. On call provider, Francia Greaves MD, made aware.

## 2022-08-06 NOTE — Discharge Summary (Signed)
Physician Discharge Summary  [name] [facility] ZHY:[id number] DOB: 05/14/1971 DOA: 08/05/2022  PCP: Patient, No Pcp Per  Admit date: 08/05/2022 Discharge date: 08/06/2022  Admitted From: Home Disposition: Home  Recommendations for Outpatient Follow-up:  Follow up with PCP in 1 week  Outpatient follow-up with dialysis unit as scheduled outpatient follow-up with vascular surgery Follow up in ED if symptoms worsen or new appear   Home Health: No  Discharge Condition: Stable CODE STATUS: Full Diet recommendation: Renal hemodialysis diet with fluid restriction of 1200 cc a day  Brief/Interim Summary:  51 y.o. male with medical history significant of ESRD on HD, HTN, chronic HFrEF, recurrent HD access issues presented with clotting of right groin AV graft.  On presentation, potassium was 5.4.  Nephrology/vascular surgery/IR were consulted. IR planning for fistulogram along with possible dialysis catheter placement today. Patient will be subsequently dialyzed and can be discharged home afterwards as per nephrology.  Discharge Diagnoses:   Clotted right thigh AV graft/recurrent HD access issues -Vascular surgery recommended IR consultation and outpatient follow-up with vascular surgery -IR planning for fistulogram along with possible dialysis catheter placement today. -Patient will be subsequently dialyzed and can be discharged home afterwards as per nephrology  End-stage renal disease on hemodialysis Hyperkalemia Metabolic acidosis -Plan as above.  Outpatient follow-up with nephrology/dialysis unit. -Hyperkalemia being managed by nephrology.  Got sodium bicarbonate, calcium gluconate, insulin/dextrose ordered this morning for potassium of 6.3.  Also on Lokelma.  Anemia of chronic disease -From renal failure.  Hemoglobin stable.  Hypertension -Continue amlodipine and clonidine  Discharge Instructions   Allergies as of 08/06/2022       Reactions   Ivp Dye [iodinated Contrast  Media] Swelling   SWELLING REACTION UNSPECIFIED    Lisinopril Cough   Solu-medrol [methylprednisolone] Nausea And Vomiting        Medication List     STOP taking these medications    DIALYVITE TABLET Tabs   Tums Ultra 1000 400 MG chewable tablet Generic drug: calcium elemental as carbonate       TAKE these medications    acetaminophen 500 MG tablet Commonly known as: TYLENOL Take 1,000 mg by mouth daily as needed (for pain or headaches).   amLODipine 10 MG tablet Commonly known as: NORVASC Take 10 mg by mouth daily.   cloNIDine 0.1 MG tablet Commonly known as: CATAPRES Take 0.1 mg by mouth 2 (two) times daily.          Allergies  Allergen Reactions   Ivp Dye [Iodinated Contrast Media] Swelling    SWELLING REACTION UNSPECIFIED    Lisinopril Cough   Solu-Medrol [Methylprednisolone] Nausea And Vomiting    Consultations: Nephrology/IR/vascular surgery   Procedures/Studies: DG Chest 1 View  Result Date: 08/06/2022 CLINICAL DATA:  51 year old male with history of congestive heart failure. EXAM: CHEST  1 VIEW COMPARISON:  Chest x-ray 07/02/2022. FINDINGS: Lung volumes are normal. No consolidative airspace disease. No pleural effusions. No pneumothorax. No pulmonary nodule or mass noted. Pulmonary vasculature and the cardiomediastinal silhouette are within normal limits. Surgical clips are noted at the level of the thoracic inlet, potentially from prior thyroidectomy. IMPRESSION: No radiographic evidence of acute cardiopulmonary disease. Electronically Signed   By: [name] M.D.   On: 08/06/2022 05:59   IR THROMBECTOMY AV FISTULA/W THROMBOLYSIS/PTA INC SHUNT/IMG RIGHT  Result Date: 08/02/2022 INDICATION: Occluded right thigh hemodialysis dialysis graft. Status post D cough procedures of this right thigh access on 07/05/2022, and 06/29/2022. Additional interventions this year to dilate venous outflow on  04/21/2022 and 12/20/2021. EXAM: 1. ULTRASOUND  GUIDANCE FOR VASCULAR ACCESS RIGHT THIGH DIALYSIS GRAFT 2. RIGHT THIGH DIALYSIS GRAFT THROMBECTOMY INCLUDING VENOUS ANGIOPLASTY MEDICATIONS: 1 mg tPA and 3000 units intravenous heparin ANESTHESIA/SEDATION: Moderate (conscious) sedation was employed during this procedure. A total of Versed 4.0 mg and Fentanyl 200 mcg was administered intravenously by radiology nursing. Moderate Sedation Time: 82 minutes. The patient's level of consciousness and vital signs were monitored continuously by radiology nursing throughout the procedure under my direct supervision. FLUOROSCOPY TIME:  Fluoroscopy Time: 19 minutes and 24 seconds. 34.0 mGy. COMPLICATIONS: None immediate. PROCEDURE: Informed written consent was obtained from the patient after a thorough discussion of the procedural risks, benefits and alternatives. All questions were addressed. Maximal Sterile Barrier Technique was utilized including caps, mask, sterile gowns, sterile gloves, sterile drape, hand hygiene and skin antiseptic. Local anesthesia was provided with 1% lidocaine. A timeout was performed prior to the initiation of the procedure. Antegrade access of the clotted right thigh dialysis graft was performed under direct ultrasound guidance with a micropuncture set. 1 mg of tPA was instilled via the micropuncture dilator. Ultrasound image documentation was performed and recorded. A 7 French antegrade sheath was placed. A 5 French catheter was advanced over a guidewire to the level of venous outflow in the right external iliac vein. Outflow venography was performed to the level of the upper inferior vena cava. Balloon angioplasty was performed at the level of a venous outflow stent located in the right external and common iliac veins with a 12 mm x 4 cm Atlas balloon. Balloon angioplasty was performed across the venous anastomosis of the graft with a 7 mm x 4 cm Conquest balloon. Balloon dilatation was also performed within the dialysis graft with the 7 mm  balloon. Mechanical thrombectomy was performed within the dialysis graft with a Cleaner rotational thrombectomy device. Retrograde access of the dialysis graft was performed with a micropuncture set under ultrasound guidance. A 6 French retrograde sheath was placed. Thrombectomy across the arterial anastomosis and within the arterial limb of the dialysis graft was performed with a 4 [place] Fogarty balloon catheter. Additional angiography was performed to evaluate patency of the graft. Balloon angioplasty was performed at the level of the right external iliac vein with the 12 mm balloon. Additional balloon angioplasty was performed across the venous anastomosis of the dialysis graft with the common femoral vein with an 8 mm x 4 cm Conquest balloon. Completion angiography of the graft and venous outflow was performed through the antegrade sheath as well as through a retrograde 5 French catheter placed up to the level of the arterial anastomosis. Both sheaths were removed and hemostasis obtained with application Ethilon pursestring sutures. FINDINGS: Recurrent stenoses at the level of the venous anastomosis, outflow external iliac vein and within the stented segment of the right external and common iliac veins were again noted. None of these appeared extremely high grade with the most significant stenosis likely at the level of the venous anastomosis followed by an external iliac vein stenosis beginning just below the level of the indwelling stent and extending into the bottom end of the stent. After reestablishing graft patency, multiple graft pseudoaneurysms are again noted diffusely along the arterial limb as well as 2 large pseudoaneurysms in the venous limb just beyond the apex. Pseudoaneurysms could also be contributing to recurrent graft thrombosis. The arterial anastomosis did not appear stenotic by angiography or ultrasound. After interventions, the only residual stenosis remaining is mild stenosis at the  juncture of  the common femoral vein and external iliac vein of approximately 30% narrowing. IMPRESSION: The right thigh graft was again able to be opened with thrombectomy and multiple levels of venous angioplasty. Exact cause frequent graft occlusion is not very clear and likely multifactorial. Graft pseudoaneurysms may be playing a role in causing slow flow and eventual thrombus formation within the pseudoaneurysms followed by the graft itself. ACCESS: This is the third thrombectomy procedure for this access in the last 5 weeks. It is becoming apparent that percutaneous declot procedures will not be effective long-term. If the graft becomes occluded again before January, 2024, vascular surgical consultation is recommended for potential graft revision versus new access placement. Electronically Signed   By: Aletta Edouard M.D.   On: 08/02/2022 17:16   IR US Guide Vasc Access Right  Result Date: 08/02/2022 INDICATION: Occluded right thigh hemodialysis dialysis graft. Status post D cough procedures of this right thigh access on 07/05/2022, and 06/29/2022. Additional interventions this year to dilate venous outflow on 04/21/2022 and 12/20/2021. EXAM: 1. ULTRASOUND GUIDANCE FOR VASCULAR ACCESS RIGHT THIGH DIALYSIS GRAFT 2. RIGHT THIGH DIALYSIS GRAFT THROMBECTOMY INCLUDING VENOUS ANGIOPLASTY MEDICATIONS: 1 mg tPA and 3000 units intravenous heparin ANESTHESIA/SEDATION: Moderate (conscious) sedation was employed during this procedure. A total of Versed 4.0 mg and Fentanyl 200 mcg was administered intravenously by radiology nursing. Moderate Sedation Time: 82 minutes. The patient's level of consciousness and vital signs were monitored continuously by radiology nursing throughout the procedure under my direct supervision. FLUOROSCOPY TIME:  Fluoroscopy Time: 19 minutes and 24 seconds. 34.0 mGy. COMPLICATIONS: None immediate. PROCEDURE: Informed written consent was obtained from the patient after a thorough discussion  of the procedural risks, benefits and alternatives. All questions were addressed. Maximal Sterile Barrier Technique was utilized including caps, mask, sterile gowns, sterile gloves, sterile drape, hand hygiene and skin antiseptic. Local anesthesia was provided with 1% lidocaine. A timeout was performed prior to the initiation of the procedure. Antegrade access of the clotted right thigh dialysis graft was performed under direct ultrasound guidance with a micropuncture set. 1 mg of tPA was instilled via the micropuncture dilator. Ultrasound image documentation was performed and recorded. A 7 French antegrade sheath was placed. A 5 French catheter was advanced over a guidewire to the level of venous outflow in the right external iliac vein. Outflow venography was performed to the level of the upper inferior vena cava. Balloon angioplasty was performed at the level of a venous outflow stent located in the right external and common iliac veins with a 12 mm x 4 cm Atlas balloon. Balloon angioplasty was performed across the venous anastomosis of the graft with a 7 mm x 4 cm Conquest balloon. Balloon dilatation was also performed within the dialysis graft with the 7 mm balloon. Mechanical thrombectomy was performed within the dialysis graft with a Cleaner rotational thrombectomy device. Retrograde access of the dialysis graft was performed with a micropuncture set under ultrasound guidance. A 6 French retrograde sheath was placed. Thrombectomy across the arterial anastomosis and within the arterial limb of the dialysis graft was performed with a 4 [place] Fogarty balloon catheter. Additional angiography was performed to evaluate patency of the graft. Balloon angioplasty was performed at the level of the right external iliac vein with the 12 mm balloon. Additional balloon angioplasty was performed across the venous anastomosis of the dialysis graft with the common femoral vein with an 8 mm x 4 cm Conquest balloon. Completion  angiography of the graft and venous outflow was performed through  the antegrade sheath as well as through a retrograde 5 French catheter placed up to the level of the arterial anastomosis. Both sheaths were removed and hemostasis obtained with application Ethilon pursestring sutures. FINDINGS: Recurrent stenoses at the level of the venous anastomosis, outflow external iliac vein and within the stented segment of the right external and common iliac veins were again noted. None of these appeared extremely high grade with the most significant stenosis likely at the level of the venous anastomosis followed by an external iliac vein stenosis beginning just below the level of the indwelling stent and extending into the bottom end of the stent. After reestablishing graft patency, multiple graft pseudoaneurysms are again noted diffusely along the arterial limb as well as 2 large pseudoaneurysms in the venous limb just beyond the apex. Pseudoaneurysms could also be contributing to recurrent graft thrombosis. The arterial anastomosis did not appear stenotic by angiography or ultrasound. After interventions, the only residual stenosis remaining is mild stenosis at the juncture of the common femoral vein and external iliac vein of approximately 30% narrowing. IMPRESSION: The right thigh graft was again able to be opened with thrombectomy and multiple levels of venous angioplasty. Exact cause frequent graft occlusion is not very clear and likely multifactorial. Graft pseudoaneurysms may be playing a role in causing slow flow and eventual thrombus formation within the pseudoaneurysms followed by the graft itself. ACCESS: This is the third thrombectomy procedure for this access in the last 5 weeks. It is becoming apparent that percutaneous declot procedures will not be effective long-term. If the graft becomes occluded again before January, 2024, vascular surgical consultation is recommended for potential graft revision versus new  access placement. Electronically Signed   By: Aletta Edouard M.D.   On: 08/02/2022 17:16      Subjective: Patient seen and examined at bedside.  Denies worsening shortness of breath, nausea, vomiting, chest pain.  Discharge Exam: Vitals:   08/06/22 0453 08/06/22 0842  BP: 122/79 (!) 151/91  Pulse: (!) 53 67  Resp: 18 18  Temp: 98 F (36.7 C) 97.6 F (36.4 C)  SpO2: 96% 100%    General: Pt is alert, awake, not in acute distress.  Looks chronically ill and deconditioned.  Currently on room air. Cardiovascular: Mild intermittent bradycardia present; S1/S2 + Respiratory: bilateral decreased breath sounds at bases with some scattered crackles Abdominal: Soft, NT, ND, bowel sounds + Extremities: Trace lower extremity edema present; no cyanosis    The results of significant diagnostics from this hospitalization (including imaging, microbiology, ancillary and laboratory) are listed below for reference.     Microbiology: Recent Results (from the past 240 hour(s))  MRSA Next Gen by PCR, Nasal     Status: None   Collection Time: 08/06/22  4:41 AM   Specimen: Nasal Mucosa; Nasal Swab  Result Value Ref Range Status   MRSA by PCR Next Gen NOT DETECTED NOT DETECTED Final    Comment: (NOTE) The GeneXpert MRSA Assay (FDA approved for NASAL specimens only), is one component of a comprehensive MRSA colonization surveillance program. It is not intended to diagnose MRSA infection nor to guide or monitor treatment for MRSA infections. Test performance is not FDA approved in patients less than 44 years old. Performed at [facility], [place] [address] [place], [place] [address]      Labs: BNP (last 3 results) Recent Labs    07/02/22 0948  BNP 09.3   Basic Metabolic Panel: Recent Labs  Lab 08/05/22 1029 08/06/22 0340  NA 138 136  K 5.4* 6.3*  CL 94* 98  CO2 21* 21*  GLUCOSE 140* 170*  BUN 89* 107*  CREATININE 16.33* 19.36*  CALCIUM 8.0* 7.6*   Liver Function  Tests: No results for input(s): "AST", "ALT", "ALKPHOS", "BILITOT", "PROT", "ALBUMIN" in the last 168 hours. No results for input(s): "LIPASE", "AMYLASE" in the last 168 hours. No results for input(s): "AMMONIA" in the last 168 hours. CBC: Recent Labs  Lab 08/05/22 1029  WBC 5.8  NEUTROABS 4.0  HGB 11.8*  HCT 36.7*  MCV 97.9  PLT 214   Cardiac Enzymes: No results for input(s): "CKTOTAL", "CKMB", "CKMBINDEX", "TROPONINI" in the last 168 hours. BNP: Invalid input(s): "POCBNP" CBG: No results for input(s): "GLUCAP" in the last 168 hours. D-Dimer No results for input(s): "DDIMER" in the last 72 hours. Hgb A1c No results for input(s): "HGBA1C" in the last 72 hours. Lipid Profile No results for input(s): "CHOL", "HDL", "LDLCALC", "TRIG", "CHOLHDL", "LDLDIRECT" in the last 72 hours. Thyroid function studies No results for input(s): "TSH", "T4TOTAL", "T3FREE", "THYROIDAB" in the last 72 hours.  Invalid input(s): "FREET3" Anemia work up No results for input(s): "VITAMINB12", "FOLATE", "FERRITIN", "TIBC", "IRON", "RETICCTPCT" in the last 72 hours. Urinalysis    Component Value Date/Time   COLORURINE YELLOW 08/01/2009 0742   APPEARANCEUR TURBID (A) 08/01/2009 0742   LABSPEC 1.018 08/01/2009 0742   PHURINE 5.5 08/01/2009 0742   GLUCOSEU NEGATIVE 08/01/2009 0742   HGBUR LARGE (A) 08/01/2009 0742   BILIRUBINUR SMALL (A) 08/01/2009 0742   KETONESUR 15 (A) 08/01/2009 0742   PROTEINUR 100 (A) 08/01/2009 0742   UROBILINOGEN 1.0 08/01/2009 0742   NITRITE NEGATIVE 08/01/2009 0742   LEUKOCYTESUR LARGE (A) 08/01/2009 0742   Sepsis Labs Recent Labs  Lab 08/05/22 1029  WBC 5.8   Microbiology Recent Results (from the past 240 hour(s))  MRSA Next Gen by PCR, Nasal     Status: None   Collection Time: 08/06/22  4:41 AM   Specimen: Nasal Mucosa; Nasal Swab  Result Value Ref Range Status   MRSA by PCR Next Gen NOT DETECTED NOT DETECTED Final    Comment: (NOTE) The GeneXpert MRSA  Assay (FDA approved for NASAL specimens only), is one component of a comprehensive MRSA colonization surveillance program. It is not intended to diagnose MRSA infection nor to guide or monitor treatment for MRSA infections. Test performance is not FDA approved in patients less than 82 years old. Performed at [facility], [place] [address] [place], [place] [address]      Time coordinating discharge: 35 minutes  SIGNED:   Aline August, MD  [facility] 08/06/2022, 10:31 AM

## 2022-08-06 NOTE — Progress Notes (Signed)
Received patient in bed to unit.  Alert and oriented.  Informed consent signed and in chart.   Treatment initiated: [place] Treatment completed: 1830  Patient tolerated well.  Transported back to the room  Alert, without acute distress.  Hand-off given to patient's nurse.   Access used: Catheter Access issues: none  Total UF removed: 2000 Medication(s) given: none Post HD VS: 98.7, 180/96(112), HR-64, RR-14, SP02-99 Post HD weight: 61.4kg   [facility]

## 2022-08-06 NOTE — Procedures (Signed)
   I was present at this dialysis session, have reviewed the session itself and made  appropriate changes Kelly Splinter MD [place] pager [phone]   08/06/2022, 3:37 PM

## 2022-08-07 LAB — HEPATITIS B SURFACE ANTIBODY, QUANTITATIVE: Hep B S AB Quant (Post): <3.1 m[IU]/mL — ABNORMAL LOW (ref >9.9)

## 2022-08-17 ENCOUNTER — Ambulatory Visit (INDEPENDENT_AMBULATORY_CARE_PROVIDER_SITE_OTHER): Payer: Self-pay | Admitting: Primary Care

## 2022-09-06 ENCOUNTER — Other Ambulatory Visit: Payer: Self-pay | Admitting: *Deleted

## 2022-09-06 DIAGNOSIS — N186 End stage renal disease: Secondary | ICD-10-CM

## 2022-09-13 ENCOUNTER — Ambulatory Visit (HOSPITAL_COMMUNITY): Payer: Self-pay

## 2022-09-13 ENCOUNTER — Encounter: Payer: Self-pay | Admitting: Vascular Surgery

## 2022-09-21 ENCOUNTER — Emergency Department (HOSPITAL_COMMUNITY): Payer: Self-pay

## 2022-09-21 ENCOUNTER — Encounter (HOSPITAL_COMMUNITY): Payer: Self-pay | Admitting: Emergency Medicine

## 2022-09-21 ENCOUNTER — Emergency Department (HOSPITAL_COMMUNITY)
Admission: EM | Admit: 2022-09-21 | Discharge: 2022-09-22 | Disposition: A | Discharge status: Home / Self Care | Payer: Self-pay | Attending: Emergency Medicine | Admitting: Emergency Medicine

## 2022-09-21 DIAGNOSIS — R079 Chest pain, unspecified: Secondary | ICD-10-CM | POA: Insufficient documentation

## 2022-09-21 NOTE — ED Triage Notes (Signed)
Pt reports intermittent CP for a week. Reports pain is worse at dialysis (MWF). Last HD Monday and due today. No pain at this time. Also reports always feeling like bugs are crawling on his body.

## 2022-09-21 NOTE — ED Notes (Signed)
Unable to get blood from pt. 2 attempts tried, nurse notified.

## 2022-09-21 NOTE — ED Provider Triage Note (Signed)
Emergency Medicine Provider Triage Evaluation Note  [name] , a 51 y.o. male  was evaluated in triage.  Pt complains of chest pain.  Started about a week ago.  Chest pain is in the left side of his chest.  It is reproducible feels sharp.  Denies shortness of breath.  Denies trauma.  It is nonradiating.  Patient is on dialysis Monday Wednesday Friday.  States he is up-to-date with his dialysis.  States the pain is just progressively worsened which prompted him to come in today.  Review of Systems  Positive: See above Negative: See above  Physical Exam  BP (!) 165/99 (BP Location: Right Arm)   Pulse (!) 52   Temp 98 F (36.7 C)   Resp 17   Ht 6' (1.829 m)   Wt 65 kg   SpO2 100%   BMI 19.43 kg/m  Gen:   Awake, no distress   Resp:  Normal effort  MSK:   Moves extremities without difficulty  Other:    Medical Decision Making  Medically screening exam initiated at 10:12 AM.  Appropriate orders placed.  Carnell Abdou Garinger was informed that the remainder of the evaluation will be completed by another provider, this initial triage assessment does not replace that evaluation, and the importance of remaining in the ED until their evaluation is complete.  Work up started   Harriet Pho, PA-C 09/21/22 1013

## 2022-09-22 LAB — I-STAT ARTERIAL BLOOD GAS, ED
Acid-base deficit: 1 mmol/L (ref 0.0–2.0)
Bicarbonate: 21.9 mmol/L (ref 20.0–28.0)
Calcium, Ion: 0.92 mmol/L — ABNORMAL LOW (ref 1.15–1.40)
HCT: 38 % — ABNORMAL LOW (ref 39.0–52.0)
Hemoglobin: 12.9 g/dL — ABNORMAL LOW (ref 13.0–17.0)
O2 Saturation: 100 %
Patient temperature: 98.6
Potassium: 4.7 mmol/L (ref 3.5–5.1)
Sodium: 137 mmol/L (ref 135–145)
TCO2: 23 mmol/L (ref 22–32)
pCO2 arterial: 29.3 mmHg — ABNORMAL LOW (ref 32–48)
pH, Arterial: 7.483 — ABNORMAL HIGH (ref 7.35–7.45)
pO2, Arterial: 161 mmHg — ABNORMAL HIGH (ref 83–108)

## 2022-09-22 LAB — CBC
HCT: 37.1 % — ABNORMAL LOW (ref 39.0–52.0)
Hemoglobin: 12.5 g/dL — ABNORMAL LOW (ref 13.0–17.0)
MCH: 31.3 pg (ref 26.0–34.0)
MCHC: 33.7 g/dL (ref 30.0–36.0)
MCV: 93 fL (ref 80.0–100.0)
Platelets: 192 10*3/uL (ref 150–400)
RBC: 3.99 MIL/uL — ABNORMAL LOW (ref 4.22–5.81)
RDW: 14.1 % (ref 11.5–15.5)
WBC: 3.9 10*3/uL — ABNORMAL LOW (ref 4.0–10.5)
nRBC: 0 % (ref 0.0–0.2)

## 2022-09-22 LAB — BASIC METABOLIC PANEL
Anion gap: 24 — ABNORMAL HIGH (ref 5–15)
BUN: 67 mg/dL — ABNORMAL HIGH (ref 6–20)
CO2: 19 mmol/L — ABNORMAL LOW (ref 22–32)
Calcium: 8.3 mg/dL — ABNORMAL LOW (ref 8.9–10.3)
Chloride: 96 mmol/L — ABNORMAL LOW (ref 98–111)
Creatinine, Ser: 18.6 mg/dL — ABNORMAL HIGH (ref 0.61–1.24)
GFR, Estimated: 3 mL/min — ABNORMAL LOW (ref >60)
Glucose, Bld: 66 mg/dL — ABNORMAL LOW (ref 70–99)
Potassium: 4.8 mmol/L (ref 3.5–5.1)
Sodium: 139 mmol/L (ref 135–145)

## 2022-09-22 LAB — TROPONIN I (HIGH SENSITIVITY): Troponin I (High Sensitivity): 16 ng/L (ref <18)

## 2022-09-22 NOTE — ED Provider Notes (Signed)
[facility] EMERGENCY DEPARTMENT Provider Note   CSN: [id number] Arrival date & time: 09/21/22  2876     History  Chief Complaint  Patient presents with   Chest Pain    [name] is a 51 y.o. male.  51 year old male who presents the ER today chest pain.  Patient states he gets chest pain almost every day but specifically gets it at the end of dialysis.  It is a sharp pressure in his left lateral chest.  Not pleuritic.  Does not radiate.  No associated nausea vomiting shortness of breath or diaphoresis.  No syncope.  Patient states has been going on for at least a month.  Does not remember when it started exactly.  No chest pain at this time.   Chest Pain      Home Medications Prior to Admission medications   Medication Sig Start Date End Date Taking? Authorizing Provider  acetaminophen (TYLENOL) 500 MG tablet Take 1,000 mg by mouth daily as needed (for pain or headaches).    [provider]  amLODipine (NORVASC) 10 MG tablet Take 10 mg by mouth daily.    [provider]  cloNIDine (CATAPRES) 0.1 MG tablet Take 0.1 mg by mouth 2 (two) times daily.    [provider]  fluticasone (FLONASE) 50 MCG/ACT nasal spray Place 2 sprays into both nostrils daily. Patient not taking: Reported on 06/15/2018 07/25/17 02/20/20  Larene Pickett, PA-C      Allergies    Ivp dye [iodinated contrast media], Lisinopril, and Solu-medrol [methylprednisolone]    Review of Systems   Review of Systems  Cardiovascular:  Positive for chest pain.    Physical Exam Updated Vital Signs BP (!) 154/94 (BP Location: Right Arm)   Pulse 67   Temp (!) 97.5 F (36.4 C) (Oral)   Resp 18   Ht 6' (1.829 m)   Wt 65 kg   SpO2 99%   BMI 19.43 kg/m  Physical Exam Vitals and nursing note reviewed.  Constitutional:      Appearance: He is well-developed.  HENT:     Head: Normocephalic and atraumatic.  Cardiovascular:     Rate and Rhythm: Normal rate.   Pulmonary:     Effort: Pulmonary effort is normal. No respiratory distress.     Breath sounds: Normal breath sounds.  Abdominal:     General: There is no distension.  Musculoskeletal:        General: Normal range of motion.     Cervical back: Normal range of motion.     Right lower leg: No edema.  Skin:    General: Skin is warm and dry.  Neurological:     Mental Status: He is alert.     ED Results / Procedures / Treatments   Labs (all labs ordered are listed, but only abnormal results are displayed) Labs Reviewed  BASIC METABOLIC PANEL - Abnormal; Notable for the following components:      Result Value   Chloride 96 (*)    CO2 19 (*)    Glucose, Bld 66 (*)    BUN 67 (*)    Creatinine, Ser 18.60 (*)    Calcium 8.3 (*)    GFR, Estimated 3 (*)    Anion gap 24 (*)    All other components within normal limits  CBC - Abnormal; Notable for the following components:   WBC 3.9 (*)    RBC 3.99 (*)    Hemoglobin 12.5 (*)  HCT 37.1 (*)    All other components within normal limits  I-STAT ARTERIAL BLOOD GAS, ED - Abnormal; Notable for the following components:   pH, Arterial 7.483 (*)    pCO2 arterial 29.3 (*)    pO2, Arterial 161 (*)    Calcium, Ion 0.92 (*)    HCT 38.0 (*)    Hemoglobin 12.9 (*)    All other components within normal limits  TROPONIN I (HIGH SENSITIVITY)    EKG EKG Interpretation  Date/Time:  Wednesday September 21 2022 09:44:06 EST Ventricular Rate:  58 PR Interval:  184 QRS Duration: 88 QT Interval:  482 QTC Calculation: 473 R Axis:   66 Text Interpretation: Sinus bradycardia Otherwise normal ECG When compared with ECG of 05-Aug-2022 17:08, PREVIOUS ECG IS PRESENT Confirmed by Merrily Pew [id number]) on 09/21/2022 11:06:46 PM  Radiology DG Chest 2 View  Result Date: 09/21/2022 CLINICAL DATA:  cp EXAM: CHEST - 2 VIEW COMPARISON:  08/07/2019 FINDINGS: Cardiac silhouette is unremarkable. No pneumothorax or pleural effusion. The lungs are clear. The  visualized skeletal structures are unremarkable. Right IJ catheter tip distal SVC. IMPRESSION: No acute cardiopulmonary process. Electronically Signed   By: Sammie Bench M.D.   On: 09/21/2022 10:23    Procedures Procedures    Medications Ordered in ED Medications - No data to display  ED Course/ Medical Decision Making/ A&P                           Medical Decision Making Amount and/or Complexity of Data Reviewed Labs: ordered. Radiology: ordered.   Low concern for pulmonary embolus.  EKG and troponins unremarkable, reassuring that it is not ACS.  Could be related to the dialysis procedure itself and/or taking off too much volume. Will fu w/ PCP PRN.   Final Clinical Impression(s) / ED Diagnoses Final diagnoses:  Nonspecific chest pain    Rx / DC Orders ED Discharge Orders     None         Leanord Thibeau, Corene Cornea, MD 09/22/22 [phone]

## 2022-09-22 NOTE — ED Notes (Signed)
Pt c/o dizzy/SOB when standing. Pt escorted back to the stretcher. O2 at 99%; 2L [place] provided per pt request; BP 147/80

## 2022-10-12 ENCOUNTER — Ambulatory Visit (HOSPITAL_COMMUNITY)
Admission: RE | Admit: 2022-10-12 | Discharge: 2022-10-12 | Disposition: A | Discharge status: Home / Self Care | Payer: Self-pay | Source: Ambulatory Visit | Attending: Vascular Surgery | Admitting: Vascular Surgery

## 2022-10-12 ENCOUNTER — Ambulatory Visit (INDEPENDENT_AMBULATORY_CARE_PROVIDER_SITE_OTHER)
Admission: RE | Admit: 2022-10-12 | Discharge: 2022-10-12 | Disposition: A | Discharge status: Home / Self Care | Payer: Self-pay | Source: Ambulatory Visit | Attending: Vascular Surgery | Admitting: Vascular Surgery

## 2022-10-12 ENCOUNTER — Ambulatory Visit (INDEPENDENT_AMBULATORY_CARE_PROVIDER_SITE_OTHER): Payer: Self-pay | Admitting: Vascular Surgery

## 2022-10-12 ENCOUNTER — Encounter: Payer: Self-pay | Admitting: Vascular Surgery

## 2022-10-12 VITALS — BP 122/84 | HR 70 | Temp 97.7°F | Resp 20 | Ht 72.0 in | Wt 149.0 lb

## 2022-10-12 DIAGNOSIS — Z992 Dependence on renal dialysis: Secondary | ICD-10-CM

## 2022-10-12 DIAGNOSIS — N186 End stage renal disease: Secondary | ICD-10-CM | POA: Insufficient documentation

## 2022-10-12 NOTE — Progress Notes (Signed)
Patient ID: [name], male   DOB: 1970-11-23, 52 y.o.   MRN: [id number]  Reason for Consult: New Patient (Initial Visit)   Referred by [name], *  Subjective:     HPI:  [name] is a 52 y.o. male with a history of end-stage renal disease.  He has a failed right radiocephalic fistula and also left arm graft as well as bilateral thigh grafts.  He has never had any upper arm access on the right.  He is right-hand dominant.  Currently dialyzes on Mondays, Wednesdays and Fridays.  He does not take any anticoagulation.  Past Medical History:  Diagnosis Date   Anemia    Anxiety    Depression    ESRD on hemodialysis (HCC)    HD [facility] MWF   Hypertension    Thyroid disease    Family History  Problem Relation Age of Onset   Renal Disease Neg Hx    Past Surgical History:  Procedure Laterality Date   ARTERIOVENOUS GRAFT PLACEMENT Left    "forearm, it's not working; thigh"    ARTERIOVENOUS GRAFT PLACEMENT Left 11/09/2015   AV FISTULA PLACEMENT Right 01/10/2017   Procedure: INSERTION OF ARTERIOVENOUS Right thigh GORE-TEX GRAFT;  Surgeon: [name], MD;  Location: [facility];  Service: Vascular;  Laterality: Right;   FALSE ANEURYSM REPAIR Left 11/09/2015   Procedure: REPAIR OF LEFT FEMORAL PSEUDOANEURYSM; REVISION  OF LEFT THIGH ARTERIOVENOUS GRAFT USING 6MM X 10 CM GORETEX GRAFT ;  Surgeon: [name], MD;  Location: [facility] OR;  Service: Vascular;  Laterality: Left;   IR AV DIALY SHUNT INTRO NEEDLE/INTRACATH INITIAL W/PTA/IMG RIGHT Right 02/06/2019   IR AV DIALY SHUNT INTRO NEEDLE/INTRACATH INITIAL W/PTA/IMG RIGHT Right 05/14/2019   IR AV DIALY SHUNT INTRO NEEDLE/INTRACATH INITIAL W/PTA/IMG RIGHT Right 08/18/2020   IR AV DIALY SHUNT INTRO NEEDLE/INTRACATH INITIAL W/PTA/IMG RIGHT Right 04/21/2022   IR DIALY SHUNT INTRO NEEDLE/INTRACATH INITIAL W/IMG RIGHT Right 09/17/2019   IR DIALY SHUNT INTRO NEEDLE/INTRACATH INITIAL W/IMG RIGHT Right 12/10/2019    IR FLUORO GUIDE CV LINE RIGHT  07/10/2021   IR FLUORO GUIDE CV LINE RIGHT  06/27/2022   IR FLUORO GUIDE CV LINE RIGHT  08/06/2022   IR GENERIC HISTORICAL  07/15/2016   IR US GUIDE VASC ACCESS LEFT 07/15/2016 [name], MD [facility]   IR GENERIC HISTORICAL Left 07/15/2016   IR THROMBECTOMY AV FISTULA W/THROMBOLYSIS/PTA INC/SHUNT/IMG LEFT 07/15/2016 [name], MD [facility]   IR GENERIC HISTORICAL  10/05/2016   IR US GUIDE VASC ACCESS LEFT 10/05/2016 [name], MD [facility]   IR GENERIC HISTORICAL Left 10/05/2016   IR THROMBECTOMY AV FISTULA W/THROMBOLYSIS/PTA INC/SHUNT/IMG LEFT 10/05/2016 [name], MD [facility]   IR GENERIC HISTORICAL  10/08/2016   IR FLUORO GUIDE CV LINE RIGHT 10/08/2016 [name], MD [facility]   IR GENERIC HISTORICAL  10/08/2016   IR US GUIDE VASC ACCESS RIGHT 10/08/2016 [name], MD [facility]   IR PTA AND STENT ADDL CENTRAL DIALY SEG THRU DIALY CIRCUIT RIGHT Right 12/10/2019   IR RADIOLOGIST EVAL & MGMT  11/26/2019   IR REMOVAL TUN CV CATH W/O FL  03/16/2017   IR REMOVAL TUN CV CATH W/O FL  07/22/2021   IR THROMBECTOMY AV FISTULA W/THROMBOLYSIS/PTA INC/SHUNT/IMG RIGHT Right 06/16/2018   IR THROMBECTOMY AV FISTULA W/THROMBOLYSIS/PTA INC/SHUNT/IMG RIGHT Right 06/17/2018   IR THROMBECTOMY AV FISTULA W/THROMBOLYSIS/PTA INC/SHUNT/IMG RIGHT Right 07/15/2021   IR THROMBECTOMY AV FISTULA W/THROMBOLYSIS/PTA INC/SHUNT/IMG RIGHT  Right 06/29/2022   IR THROMBECTOMY AV FISTULA W/THROMBOLYSIS/PTA INC/SHUNT/IMG RIGHT Right 07/05/2022   IR THROMBECTOMY AV FISTULA W/THROMBOLYSIS/PTA INC/SHUNT/IMG RIGHT Right 08/02/2022   IR US GUIDE VASC ACCESS RIGHT  06/17/2018   IR US GUIDE VASC ACCESS RIGHT  06/16/2018   IR US GUIDE VASC ACCESS RIGHT  02/06/2019   IR US GUIDE VASC ACCESS RIGHT  05/14/2019   IR US GUIDE VASC ACCESS RIGHT  09/17/2019   IR US GUIDE VASC ACCESS RIGHT  12/10/2019   IR US GUIDE VASC ACCESS RIGHT  08/18/2020   IR US GUIDE VASC ACCESS RIGHT  07/10/2021   IR US  GUIDE VASC ACCESS RIGHT  07/19/2021   IR US GUIDE VASC ACCESS RIGHT  04/21/2022   IR US GUIDE VASC ACCESS RIGHT  06/27/2022   IR US GUIDE VASC ACCESS RIGHT  06/29/2022   IR US GUIDE VASC ACCESS RIGHT  07/05/2022   IR US GUIDE VASC ACCESS RIGHT  08/02/2022   IR US GUIDE VASC ACCESS RIGHT  08/06/2022   PARATHYROIDECTOMY N/A 04/24/2014   Procedure: TOTAL 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;  Surgeon: [name], MD;  Location: [facility];  Service: General;  Laterality: N/A;  NECK AND LEFT FOREARM   PERIPHERAL VASCULAR CATHETERIZATION N/A 05/05/2016   Procedure: A/V Shuntogram;  Surgeon: Conrad Perry, MD;  Location: [facility] CV LAB;  Service: Cardiovascular;  Laterality: N/A;   PERIPHERAL VASCULAR CATHETERIZATION Left 05/05/2016   Procedure: Peripheral Vascular Balloon Angioplasty;  Surgeon: Conrad [place], MD;  Location: [facility] CV LAB;  Service: Cardiovascular;  Laterality: Left;   PSEUDOANEURYSM REPAIR Left 11/09/2015   TEE WITHOUT CARDIOVERSION N/A 02/19/2021   Procedure: TRANSESOPHAGEAL ECHOCARDIOGRAM (TEE);  Surgeon: [name], MD;  Location[facility] ENDOSCOPY;  Service: Cardiovascular;  Laterality: N/A;   THROMBECTOMY / ARTERIOVENOUS GRAFT REVISION Left 08/18/2015   thigh   THROMBECTOMY AND REVISION OF ARTERIOVENTOUS (AV) GORETEX  GRAFT Left 08/23/2015   Procedure: THROMBECTOMY AND REVISION OF ARTERIOVENTOUS (AV) GORETEX  GRAFT LEFT THIGH ;  Surgeon: [name], MD;  Location: [facility];  Service: Vascular;  Laterality: Left;   THROMBECTOMY W/ EMBOLECTOMY Left 08/18/2015   Procedure: THROMBECTOMY  AND REVISION ARTERIOVENOUS GORE-TEX GRAFT/LEFT THIGH;  Surgeon: [name], MD;  Location: [facility];  Service: Vascular;  Laterality: Left;   THROMBECTOMY W/ EMBOLECTOMY Right 06/19/2018   Procedure: THROMBECTOMY AND REVISION OF RIGHT THIGH  ARTERIOVENOUS GORE-TEX GRAFT;  Surgeon: [name], MD;  Location: [facility] OR;  Service: Vascular;  Laterality: Right;   UPPER  EXTREMITY VENOGRAPHY Bilateral 12/27/2016   Procedure: Upper Extremity Venography;  Surgeon: [name], MD;  Location: [facility] CV LAB;  Service: Cardiovascular;  Laterality: Bilateral;   VENOGRAM Left 05/05/2016   Procedure: Venogram;  Surgeon: Conrad Sula, MD;  Location: [facility] CV LAB;  Service: Cardiovascular;  Laterality: Left;  lower extremity    Short Social History:  Social History   Tobacco Use   Smoking status: Former    Types: Cigarettes    Quit date: 03/21/1986    Years since quitting: 36.5   Smokeless tobacco: Never  Substance Use Topics   Alcohol use: No    Alcohol/week: 0.0 standard drinks of alcohol    Allergies  Allergen Reactions   Ivp Dye [Iodinated Contrast Media] Swelling    SWELLING REACTION UNSPECIFIED    Lisinopril Cough   Solu-Medrol [Methylprednisolone] Nausea And Vomiting    Current Outpatient Medications  Medication Sig Dispense Refill  acetaminophen (TYLENOL) 500 MG tablet Take 1,000 mg by mouth daily as needed (for pain or headaches).     amLODipine (NORVASC) 10 MG tablet Take 10 mg by mouth daily.     cloNIDine (CATAPRES) 0.1 MG tablet Take 0.1 mg by mouth 2 (two) times daily.     No current facility-administered medications for this visit.   Facility-Administered Medications Ordered in Other Visits  Medication Dose Route Frequency Provider Last Rate Last Admin   0.9 %  sodium chloride infusion   Intravenous Continuous Monia Sabal, PA-C   New Bag at 02/19/21 1347    Review of Systems  Constitutional:  Constitutional negative. HENT: HENT negative.  Eyes: Eyes negative.  Respiratory: Respiratory negative.  Cardiovascular: Cardiovascular negative.  GI: Gastrointestinal negative.  Musculoskeletal: Musculoskeletal negative.  Skin: Skin negative.  Neurological: Neurological negative. Hematologic: Hematologic/lymphatic negative.  Psychiatric: Psychiatric negative.        Objective:  Objective   Vitals:   10/12/22 1454   BP: 122/84  Pulse: 70  Resp: 20  Temp: 97.7 F (36.5 C)  SpO2: 99%  Weight: 149 lb (67.6 kg)  Height: 6' (1.829 m)   Body mass index is 20.21 kg/m.  Physical Exam HENT:     Head: Normocephalic.     Nose: Nose normal.  Eyes:     Pupils: Pupils are equal, round, and reactive to light.  Neck:     Comments: Right neck tdc in place Cardiovascular:     Pulses:          Radial pulses are 0 on the right side.  Abdominal:     General: Abdomen is flat.     Palpations: Abdomen is soft.  Musculoskeletal:        General: Normal range of motion.     Right lower leg: No edema.     Left lower leg: No edema.  Skin:    General: Skin is warm.     Capillary Refill: Capillary refill takes less than 2 seconds.  Neurological:     General: No focal deficit present.     Mental Status: He is alert.  Psychiatric:        Mood and Affect: Mood normal.     Data: Right Cephalic   Diameter (cm)Depth (cm)Findings  +-----------------+-------------+----------+--------+  Prox upper arm       0.03        0.14             +-----------------+-------------+----------+--------+  Mid upper arm        0.18        0.16             +-----------------+-------------+----------+--------+  Dist upper arm       0.08        0.13             +-----------------+-------------+----------+--------+  Antecubital fossa    0.08        0.16             +-----------------+-------------+----------+--------+  Prox forearm         0.14        0.19             +-----------------+-------------+----------+--------+  Mid forearm          0.07        0.18             +-----------------+-------------+----------+--------+  Dist forearm         0.08  0.16             +-----------------+-------------+----------+--------+   +-----------------+-------------+----------+---------+  Right Basilic    Diameter (cm)Depth (cm)Findings    +-----------------+-------------+----------+---------+  Prox upper arm       0.31        1.05              +-----------------+-------------+----------+---------+  Mid upper arm        0.21        1.00   branching  +-----------------+-------------+----------+---------+  Dist upper arm       0.16        0.80              +-----------------+-------------+----------+---------+  Antecubital fossa    0.33        0.35              +-----------------+-------------+----------+---------+  Prox forearm         0.15        0.11   branching  +-----------------+-------------+----------+---------+  Mid forearm          0.05        0.07              +-----------------+-------------+----------+---------+  Distal forearm       0.04        0.13              +-----------------+-------------+----------+---------+   +-----------------+-------------+----------+---------+  Left Cephalic    Diameter (cm)Depth (cm)Findings   +-----------------+-------------+----------+---------+  Prox upper arm       0.21        0.19              +-----------------+-------------+----------+---------+  Mid upper arm        0.20        0.17              +-----------------+-------------+----------+---------+  Dist upper arm       0.18        0.17              +-----------------+-------------+----------+---------+  Antecubital fossa    0.19        0.13              +-----------------+-------------+----------+---------+  Prox forearm         0.19        0.16              +-----------------+-------------+----------+---------+  Mid forearm          0.12        0.13   branching  +-----------------+-------------+----------+---------+  Dist forearm         0.11        0.12              +-----------------+-------------+----------+---------+   +-----------------+-------------+----------+------------------------+  Left Basilic     Diameter (cm)Depth (cm)         Findings          +-----------------+-------------+----------+------------------------+  Prox upper arm                          occluded old AVF outflow  +-----------------+-------------+----------+------------------------+  Mid upper arm                           occluded old AVF outflow  +-----------------+-------------+----------+------------------------+  Dist upper arm  occluded old AVF outflow  +-----------------+-------------+----------+------------------------+  Antecubital fossa                       occluded old AVF outflow  +-----------------+-------------+----------+------------------------+  Prox forearm         0.14        0.25                             +-----------------+-------------+----------+------------------------+  Mid forearm          0.06        0.16                             +-----------------+-------------+----------+------------------------+  Distal forearm       0.10        0.12                             +-----------------+-------------+----------+------------------------+   Summary:  Left: Left Basilic V is occluded in the upper arm and AC fossa.   Right Pre-Dialysis Findings:  +-----------------------+----------+--------------------+---------+--------  +  Location              PSV (cm/s)Intralum. Diam. (cm)Waveform  Comments  +-----------------------+----------+--------------------+---------+--------  +  Brachial Antecub. fossa48        0.51                triphasic           +-----------------------+----------+--------------------+---------+--------  +  Radial Art at Wrist    58        0.23                triphasic           +-----------------------+----------+--------------------+---------+--------  +  Ulnar Art at Wrist     48        0.18                triphasic           +-----------------------+----------+--------------------+---------+--------  +    Left Pre-Dialysis Findings:  +-----------------------+----------+--------------------+---------+--------  +  Location              PSV (cm/s)Intralum. Diam. (cm)Waveform  Comments  +-----------------------+----------+--------------------+---------+--------  +  Brachial Antecub. fossa52        0.47                triphasic           +-----------------------+----------+--------------------+---------+--------  +  Radial Art at Wrist    46        0.20                triphasic           +-----------------------+----------+--------------------+---------+--------  +  Ulnar Art at Wrist     50        0.13                triphasic           +-----------------------+----------+--------------------+---------+--------       Assessment/Plan:    52 year old male with failed bilateral upper extremity and bilateral femoral grafts now with TDC.  I reviewed his previous venography from 2018 it does appear that maybe he had a central stenosis but that he was more than likely patent on the right given that he has a TDC in place I have discussed proceeding  with right upper extremity venography and we can proceed with possible fistula versus graft possibly will need HeRO graft after that.  I discussed this with the patient we will get him set up for venography on a nondialysis day in the near future.     Waynetta Sandy MD [facility]

## 2022-10-13 ENCOUNTER — Telehealth: Payer: Self-pay

## 2022-10-13 NOTE — Telephone Encounter (Signed)
Attempted to reach patient to schedule right arm venogram, but no answer. Left message for patient to return call.

## 2022-10-14 NOTE — Telephone Encounter (Signed)
Left message for patient to return call.

## 2022-10-17 ENCOUNTER — Other Ambulatory Visit: Payer: Self-pay

## 2022-10-17 DIAGNOSIS — N186 End stage renal disease: Secondary | ICD-10-CM

## 2022-10-17 MED ORDER — SODIUM CHLORIDE 0.9 % IV SOLN: 250.0000 mL | INTRAVENOUS | Status: DC | PRN

## 2022-10-17 MED ORDER — SODIUM CHLORIDE 0.9% FLUSH: 3.0000 mL | Freq: Two times a day (BID) | INTRAVENOUS | Status: DC

## 2022-10-17 NOTE — Telephone Encounter (Signed)
Left message for patient to return call.   Spoke with Webb Silversmith at [employer]. She volunteered to schedule patient and provide him with information and will have him call office if he needs to reschedule date. Scheduled patient for venogram at next available non-HD day, 10/27/22. Instructions provided and will fax to center at [phone]. Verbalized understanding.

## 2022-10-27 ENCOUNTER — Other Ambulatory Visit: Payer: Self-pay

## 2022-10-27 ENCOUNTER — Ambulatory Visit (HOSPITAL_COMMUNITY)
Admission: RE | Admit: 2022-10-27 | Discharge: 2022-10-27 | Disposition: A | Discharge status: Home / Self Care | Payer: Self-pay | Attending: Vascular Surgery | Admitting: Vascular Surgery

## 2022-10-27 ENCOUNTER — Encounter (HOSPITAL_COMMUNITY)
Admission: RE | Disposition: A | Discharge status: Home / Self Care | Payer: Self-pay | Source: Home / Self Care | Attending: Vascular Surgery

## 2022-10-27 ENCOUNTER — Encounter (HOSPITAL_COMMUNITY): Payer: Self-pay | Admitting: Vascular Surgery

## 2022-10-27 DIAGNOSIS — N186 End stage renal disease: Secondary | ICD-10-CM | POA: Insufficient documentation

## 2022-10-27 DIAGNOSIS — N185 Chronic kidney disease, stage 5: Secondary | ICD-10-CM

## 2022-10-27 DIAGNOSIS — I120 Hypertensive chronic kidney disease with stage 5 chronic kidney disease or end stage renal disease: Secondary | ICD-10-CM | POA: Insufficient documentation

## 2022-10-27 DIAGNOSIS — Z87891 Personal history of nicotine dependence: Secondary | ICD-10-CM | POA: Insufficient documentation

## 2022-10-27 DIAGNOSIS — Z992 Dependence on renal dialysis: Secondary | ICD-10-CM | POA: Insufficient documentation

## 2022-10-27 DIAGNOSIS — T82590A Other mechanical complication of surgically created arteriovenous fistula, initial encounter: Secondary | ICD-10-CM

## 2022-10-27 HISTORY — PX: IVC VENOGRAPHY: CATH118301

## 2022-10-27 LAB — POCT I-STAT, CHEM 8
BUN: 50 mg/dL — ABNORMAL HIGH (ref 6–20)
BUN: 81 mg/dL — ABNORMAL HIGH (ref 6–20)
BUN: 82 mg/dL — ABNORMAL HIGH (ref 6–20)
Calcium, Ion: 0.93 mmol/L — ABNORMAL LOW (ref 1.15–1.40)
Calcium, Ion: 0.95 mmol/L — ABNORMAL LOW (ref 1.15–1.40)
Calcium, Ion: 1.11 mmol/L — ABNORMAL LOW (ref 1.15–1.40)
Chloride: 101 mmol/L (ref 98–111)
Chloride: 99 mmol/L (ref 98–111)
Chloride: 99 mmol/L (ref 98–111)
Creatinine, Ser: 13.9 mg/dL — ABNORMAL HIGH (ref 0.61–1.24)
Creatinine, Ser: 14.2 mg/dL — ABNORMAL HIGH (ref 0.61–1.24)
Creatinine, Ser: 14.4 mg/dL — ABNORMAL HIGH (ref 0.61–1.24)
Glucose, Bld: 80 mg/dL (ref 70–99)
Glucose, Bld: 81 mg/dL (ref 70–99)
Glucose, Bld: 81 mg/dL (ref 70–99)
HCT: 40 % (ref 39.0–52.0)
HCT: 40 % (ref 39.0–52.0)
HCT: 42 % (ref 39.0–52.0)
Hemoglobin: 13.6 g/dL (ref 13.0–17.0)
Hemoglobin: 13.6 g/dL (ref 13.0–17.0)
Hemoglobin: 14.3 g/dL (ref 13.0–17.0)
Potassium: 4.9 mmol/L (ref 3.5–5.1)
Potassium: 8.1 mmol/L (ref 3.5–5.1)
Potassium: 8.2 mmol/L (ref 3.5–5.1)
Sodium: 133 mmol/L — ABNORMAL LOW (ref 135–145)
Sodium: 133 mmol/L — ABNORMAL LOW (ref 135–145)
Sodium: 137 mmol/L (ref 135–145)
TCO2: 27 mmol/L (ref 22–32)
TCO2: 27 mmol/L (ref 22–32)
TCO2: 29 mmol/L (ref 22–32)

## 2022-10-27 SURGERY — IVC VENOGRAPHY
Anesthesia: LOCAL

## 2022-10-27 MED ORDER — IODIXANOL 320 MG/ML IV SOLN
INTRAVENOUS | Status: DC | PRN
  Administered 2022-10-27: 45 mL

## 2022-10-27 MED ORDER — SODIUM CHLORIDE 0.9% FLUSH: 3.0000 mL | INTRAVENOUS | Status: DC | PRN

## 2022-10-27 MED ORDER — SODIUM CHLORIDE 0.9 % IV SOLN: INTRAVENOUS | Status: DC

## 2022-10-27 MED ORDER — FAMOTIDINE IN NACL 20-0.9 MG/50ML-% IV SOLN
20.0000 mg | INTRAVENOUS | Status: AC
  Administered 2022-10-27: 20 mg via INTRAVENOUS
  Filled 2022-10-27: qty 50

## 2022-10-27 MED ORDER — HEPARIN (PORCINE) IN NACL 1000-0.9 UT/500ML-% IV SOLN: INTRAVENOUS | Status: DC | PRN

## 2022-10-27 MED ORDER — DIPHENHYDRAMINE HCL 50 MG/ML IJ SOLN
25.0000 mg | INTRAMUSCULAR | Status: AC
  Administered 2022-10-27: 25 mg via INTRAVENOUS
  Filled 2022-10-27: qty 1

## 2022-10-27 SURGICAL SUPPLY — 3 items
KIT PV (KITS) ×2 IMPLANT
TRANSDUCER W/STOPCOCK (MISCELLANEOUS) ×2 IMPLANT
TRAY PV CATH (CUSTOM PROCEDURE TRAY) ×2 IMPLANT

## 2022-10-27 NOTE — H&P (Signed)
History and Physical Interval Note:  10/27/2022 9:34 AM  [name]  has presented today for surgery, with the diagnosis of instage renal.  The various methods of treatment have been discussed with the patient and family. After consideration of risks, benefits and other options for treatment, the patient has consented to  Procedure(s): UPPER EXTREMITY VENOGRAPHY (Right) as a surgical intervention.  The patient's history has been reviewed, patient examined, no change in status, stable for surgery.  I have reviewed the patient's chart and labs.  Questions were answered to the patient's satisfaction.     Marty Heck     Patient ID: [name], male   DOB: 11-24-1970, 52 y.o.   MRN: [id number]   Reason for Consult: New Patient (Initial Visit)   Referred by [name], *   Subjective:    Subjective  HPI:   [name] is a 52 y.o. male with a history of end-stage renal disease.  He has a failed right radiocephalic fistula and also left arm graft as well as bilateral thigh grafts.  He has never had any upper arm access on the right.  He is right-hand dominant.  Currently dialyzes on Mondays, Wednesdays and Fridays.  He does not take any anticoagulation.       Past Medical History:  Diagnosis Date   Anemia     Anxiety     Depression     ESRD on hemodialysis (HCC)      HD [facility] MWF   Hypertension     Thyroid disease           Family History  Problem Relation Age of Onset   Renal Disease Neg Hx           Past Surgical History:  Procedure Laterality Date   ARTERIOVENOUS GRAFT PLACEMENT Left      "forearm, it's not working; thigh"    ARTERIOVENOUS GRAFT PLACEMENT Left 11/09/2015   AV FISTULA PLACEMENT Right 01/10/2017    Procedure: INSERTION OF ARTERIOVENOUS Right thigh GORE-TEX GRAFT;  Surgeon: [name], MD;  Location: [facility];  Service: Vascular;  Laterality: Right;   FALSE ANEURYSM REPAIR Left 11/09/2015     Procedure: REPAIR OF LEFT FEMORAL PSEUDOANEURYSM; REVISION  OF LEFT THIGH ARTERIOVENOUS GRAFT USING 6MM X 10 CM GORETEX GRAFT ;  Surgeon: [name], MD;  Location: [facility] OR;  Service: Vascular;  Laterality: Left;   IR AV DIALY SHUNT INTRO NEEDLE/INTRACATH INITIAL W/PTA/IMG RIGHT Right 02/06/2019   IR AV DIALY SHUNT INTRO NEEDLE/INTRACATH INITIAL W/PTA/IMG RIGHT Right 05/14/2019   IR AV DIALY SHUNT INTRO NEEDLE/INTRACATH INITIAL W/PTA/IMG RIGHT Right 08/18/2020   IR AV DIALY SHUNT INTRO NEEDLE/INTRACATH INITIAL W/PTA/IMG RIGHT Right 04/21/2022   IR DIALY SHUNT INTRO NEEDLE/INTRACATH INITIAL W/IMG RIGHT Right 09/17/2019   IR DIALY SHUNT INTRO NEEDLE/INTRACATH INITIAL W/IMG RIGHT Right 12/10/2019   IR FLUORO GUIDE CV LINE RIGHT   07/10/2021   IR FLUORO GUIDE CV LINE RIGHT   06/27/2022   IR FLUORO GUIDE CV LINE RIGHT   08/06/2022   IR GENERIC HISTORICAL   07/15/2016    IR US GUIDE VASC ACCESS LEFT 07/15/2016 [name], MD [facility]   IR GENERIC HISTORICAL Left 07/15/2016    IR THROMBECTOMY AV FISTULA W/THROMBOLYSIS/PTA INC/SHUNT/IMG LEFT 07/15/2016 [name], MD [facility]   IR GENERIC HISTORICAL   10/05/2016    IR US GUIDE VASC ACCESS LEFT 10/05/2016 [name], MD [facility]   IR GENERIC HISTORICAL  Left 10/05/2016    IR THROMBECTOMY AV FISTULA W/THROMBOLYSIS/PTA INC/SHUNT/IMG LEFT 10/05/2016 [name], MD [facility]   IR GENERIC HISTORICAL   10/08/2016    IR FLUORO GUIDE CV LINE RIGHT 10/08/2016 [name], MD [facility]   IR GENERIC HISTORICAL   10/08/2016    IR US GUIDE VASC ACCESS RIGHT 10/08/2016 [name], MD [facility]   IR PTA AND STENT ADDL CENTRAL DIALY SEG THRU DIALY CIRCUIT RIGHT Right 12/10/2019   IR RADIOLOGIST EVAL & MGMT   11/26/2019   IR REMOVAL TUN CV CATH W/O FL   03/16/2017   IR REMOVAL TUN CV CATH W/O FL   07/22/2021   IR THROMBECTOMY AV FISTULA W/THROMBOLYSIS/PTA INC/SHUNT/IMG RIGHT Right 06/16/2018   IR THROMBECTOMY AV FISTULA W/THROMBOLYSIS/PTA INC/SHUNT/IMG RIGHT  Right 06/17/2018   IR THROMBECTOMY AV FISTULA W/THROMBOLYSIS/PTA INC/SHUNT/IMG RIGHT Right 07/15/2021   IR THROMBECTOMY AV FISTULA W/THROMBOLYSIS/PTA INC/SHUNT/IMG RIGHT Right 06/29/2022   IR THROMBECTOMY AV FISTULA W/THROMBOLYSIS/PTA INC/SHUNT/IMG RIGHT Right 07/05/2022   IR THROMBECTOMY AV FISTULA W/THROMBOLYSIS/PTA INC/SHUNT/IMG RIGHT Right 08/02/2022   IR US GUIDE VASC ACCESS RIGHT   06/17/2018   IR US GUIDE VASC ACCESS RIGHT   06/16/2018   IR US GUIDE VASC ACCESS RIGHT   02/06/2019   IR US GUIDE VASC ACCESS RIGHT   05/14/2019   IR US GUIDE VASC ACCESS RIGHT   09/17/2019   IR US GUIDE VASC ACCESS RIGHT   12/10/2019   IR US GUIDE VASC ACCESS RIGHT   08/18/2020   IR US GUIDE VASC ACCESS RIGHT   07/10/2021   IR US GUIDE VASC ACCESS RIGHT   07/19/2021   IR US GUIDE VASC ACCESS RIGHT   04/21/2022   IR US GUIDE VASC ACCESS RIGHT   06/27/2022   IR US GUIDE VASC ACCESS RIGHT   06/29/2022   IR US GUIDE VASC ACCESS RIGHT   07/05/2022   IR US GUIDE VASC ACCESS RIGHT   08/02/2022   IR US GUIDE VASC ACCESS RIGHT   08/06/2022   PARATHYROIDECTOMY N/A 04/24/2014    Procedure: TOTAL 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;  Surgeon: [name], MD;  Location: [facility];  Service: General;  Laterality: N/A;  NECK AND LEFT FOREARM   PERIPHERAL VASCULAR CATHETERIZATION N/A 05/05/2016    Procedure: A/V Shuntogram;  Surgeon: Conrad Hoyt, MD;  Location: [facility];  Service: Cardiovascular;  Laterality: N/A;   PERIPHERAL VASCULAR CATHETERIZATION Left 05/05/2016    Procedure: Peripheral Vascular Balloon Angioplasty;  Surgeon: Conrad , MD;  Location: [facility];  Service: Cardiovascular;  Laterality: Left;   PSEUDOANEURYSM REPAIR Left 11/09/2015   TEE WITHOUT CARDIOVERSION N/A 02/19/2021    Procedure: TRANSESOPHAGEAL ECHOCARDIOGRAM (TEE);  Surgeon: [name], MD;  Location: [facility] ENDOS[facility];  Service: Cardiovascular;  Laterality: N/A;   THROMBECTOMY / ARTERIOVENOUS GRAFT REVISION Left 08/18/2015     thigh   THROMBECTOMY AND REVISION OF ARTERIOVENTOUS (AV) GORETEX  GRAFT Left 08/23/2015    Procedure: THROMBECTOMY AND REVISION OF ARTERIOVENTOUS (AV) GORETEX  GRAFT LEFT THIGH ;  Surgeon: [name], MD;  Location: [facility];  Service: Vascular;  Laterality: Left;   THROMBECTOMY W/ EMBOLECTOMY Left 08/18/2015    Procedure: THROMBECTOMY  AND REVISION ARTERIOVENOUS GORE-TEX GRAFT/LEFT THIGH;  Surgeon: [name], MD;  Location: [place];  Service: Vascular;  Laterality: Left;   THROMBECTOMY W/ EMBOLECTOMY Right 06/19/2018    Procedure: THROMBECTOMY AND REVISION OF RIGHT THIGH  ARTERIOVENOUS GORE-TEX GRAFT;  Surgeon: Scot Dock,  Judeth Cornfield, MD;  Location: [facility] OR;  Service: Vascular;  Laterality: Right;   UPPER EXTREMITY VENOGRAPHY Bilateral 12/27/2016    Procedure: Upper Extremity Venography;  Surgeon: [name], MD;  Location: [facility];  Service: Cardiovascular;  Laterality: Bilateral;   VENOGRAM Left 05/05/2016    Procedure: Venogram;  Surgeon: Conrad Sour Lake, MD;  Location: [place] [facility];  Service: Cardiovascular;  Laterality: Left;  lower extremity      Short Social History:  Social History         Tobacco Use   Smoking status: Former      Types: Cigarettes      Quit date: 03/21/1986      Years since quitting: 36.5   Smokeless tobacco: Never  Substance Use Topics   Alcohol use: No      Alcohol/week: 0.0 standard drinks of alcohol           Allergies  Allergen Reactions   Ivp Dye [Iodinated Contrast Media] Swelling      SWELLING REACTION UNSPECIFIED    Lisinopril Cough   Solu-Medrol [Methylprednisolone] Nausea And Vomiting            Current Outpatient Medications  Medication Sig Dispense Refill   acetaminophen (TYLENOL) 500 MG tablet Take 1,000 mg by mouth daily as needed (for pain or headaches).       amLODipine (NORVASC) 10 MG tablet Take 10 mg by mouth daily.       cloNIDine (CATAPRES) 0.1 MG tablet Take 0.1 mg by mouth 2 (two) times daily.         No current facility-administered medications for this visit.             Facility-Administered Medications Ordered in Other Visits  Medication Dose Route Frequency Provider Last Rate Last Admin   0.9 %  sodium chloride infusion   Intravenous Continuous Monia Sabal, PA-C   New Bag at 02/19/21 1347      Review of Systems  Constitutional:  Constitutional negative. HENT: HENT negative.  Eyes: Eyes negative.  Respiratory: Respiratory negative.  Cardiovascular: Cardiovascular negative.  GI: Gastrointestinal negative.  Musculoskeletal: Musculoskeletal negative.  Skin: Skin negative.  Neurological: Neurological negative. Hematologic: Hematologic/lymphatic negative.  Psychiatric: Psychiatric negative.          Objective:    Objective [] Expand by Default    Vitals:    10/12/22 1454  BP: 122/84  Pulse: 70  Resp: 20  Temp: 97.7 F (36.5 C)  SpO2: 99%  Weight: 149 lb (67.6 kg)  Height: 6' (1.829 m)    Body mass index is 20.21 kg/m.   Physical Exam HENT:     Head: Normocephalic.     Nose: Nose normal.  Eyes:     Pupils: Pupils are equal, round, and reactive to light.  Neck:     Comments: Right neck tdc in place Cardiovascular:     Pulses:          Radial pulses are 0 on the right side.  Abdominal:     General: Abdomen is flat.     Palpations: Abdomen is soft.  Musculoskeletal:        General: Normal range of motion.     Right lower leg: No edema.     Left lower leg: No edema.  Skin:    General: Skin is warm.     Capillary Refill: Capillary refill takes less than 2 seconds.  Neurological:     General: No focal deficit present.  Mental Status: He is alert.  Psychiatric:        Mood and Affect: Mood normal.        Data: Right Cephalic   Diameter (cm)Depth (cm)Findings  +-----------------+-------------+----------+--------+  Prox upper arm       0.03        0.14             +-----------------+-------------+----------+--------+  Mid upper  arm        0.18        0.16             +-----------------+-------------+----------+--------+  Dist upper arm       0.08        0.13             +-----------------+-------------+----------+--------+  Antecubital fossa    0.08        0.16             +-----------------+-------------+----------+--------+  Prox forearm         0.14        0.19             +-----------------+-------------+----------+--------+  Mid forearm          0.07        0.18             +-----------------+-------------+----------+--------+  Dist forearm         0.08        0.16             +-----------------+-------------+----------+--------+   +-----------------+-------------+----------+---------+  Right Basilic    Diameter (cm)Depth (cm)Findings   +-----------------+-------------+----------+---------+  Prox upper arm       0.31        1.05              +-----------------+-------------+----------+---------+  Mid upper arm        0.21        1.00   branching  +-----------------+-------------+----------+---------+  Dist upper arm       0.16        0.80              +-----------------+-------------+----------+---------+  Antecubital fossa    0.33        0.35              +-----------------+-------------+----------+---------+  Prox forearm         0.15        0.11   branching  +-----------------+-------------+----------+---------+  Mid forearm          0.05        0.07              +-----------------+-------------+----------+---------+  Distal forearm       0.04        0.13              +-----------------+-------------+----------+---------+   +-----------------+-------------+----------+---------+  Left Cephalic    Diameter (cm)Depth (cm)Findings   +-----------------+-------------+----------+---------+  Prox upper arm       0.21        0.19              +-----------------+-------------+----------+---------+  Mid upper arm        0.20         0.17              +-----------------+-------------+----------+---------+  Dist upper arm       0.18        0.17              +-----------------+-------------+----------+---------+  Antecubital fossa    0.19        0.13              +-----------------+-------------+----------+---------+  Prox forearm         0.19        0.16              +-----------------+-------------+----------+---------+  Mid forearm          0.12        0.13   branching  +-----------------+-------------+----------+---------+  Dist forearm         0.11        0.12              +-----------------+-------------+----------+---------+   +-----------------+-------------+----------+------------------------+  Left Basilic     Diameter (cm)Depth (cm)        Findings          +-----------------+-------------+----------+------------------------+  Prox upper arm                          occluded old AVF outflow  +-----------------+-------------+----------+------------------------+  Mid upper arm                           occluded old AVF outflow  +-----------------+-------------+----------+------------------------+  Dist upper arm                          occluded old AVF outflow  +-----------------+-------------+----------+------------------------+  Antecubital fossa                       occluded old AVF outflow  +-----------------+-------------+----------+------------------------+  Prox forearm         0.14        0.25                             +-----------------+-------------+----------+------------------------+  Mid forearm          0.06        0.16                             +-----------------+-------------+----------+------------------------+  Distal forearm       0.10        0.12                             +-----------------+-------------+----------+------------------------+   Summary:  Left: Left Basilic V is occluded in the upper arm and AC  fossa.    Right Pre-Dialysis Findings:  +-----------------------+----------+--------------------+---------+--------  +  Location              PSV (cm/s)Intralum. Diam. (cm)Waveform  Comments  +-----------------------+----------+--------------------+---------+--------  +  Brachial Antecub. fossa48        0.51                triphasic           +-----------------------+----------+--------------------+---------+--------  +  Radial Art at Wrist    58        0.23                triphasic           +-----------------------+----------+--------------------+---------+--------  +  Ulnar Art at Wrist     48        0.18  triphasic           +-----------------------+----------+--------------------+---------+--------  +   Left Pre-Dialysis Findings:  +-----------------------+----------+--------------------+---------+--------  +  Location              PSV (cm/s)Intralum. Diam. (cm)Waveform  Comments  +-----------------------+----------+--------------------+---------+--------  +  Brachial Antecub. fossa52        0.47                triphasic           +-----------------------+----------+--------------------+---------+--------  +  Radial Art at Wrist    46        0.20                triphasic           +-----------------------+----------+--------------------+---------+--------  +  Ulnar Art at Wrist     50        0.13                triphasic           +-----------------------+----------+--------------------+---------+--------         Assessment/Plan:    Assessment 52 year old male with failed bilateral upper extremity and bilateral femoral grafts now with TDC.  I reviewed his previous venography from 2018 it does appear that maybe he had a central stenosis but that he was more than likely patent on the right given that he has a TDC in place I have discussed proceeding with right upper extremity venography and we can proceed  with possible fistula versus graft possibly will need HeRO graft after that.  I discussed this with the patient we will get him set up for venography on a nondialysis day in the near future.         Waynetta Sandy MD [facility]

## 2022-10-27 NOTE — Op Note (Signed)
Date: October 27, 2022  Preoperative diagnosis: End-stage renal disease  Postoperative diagnosis: Same  Procedure: Right upper extremity venogram  Surgeon: Dr. Marty Heck, MD  Assistant: Cath Lab staff  Indications: 52 year old male that has had failed right radiocephalic AVF, left upper arm AV graft as well as bilateral thigh graft.  He has been evaluate by Dr. Donzetta Matters and recommended right extremity venogram.  Findings: Right upper arm brachial, axillary, and subclavian veins are patent.  The innominate vein has very little filling around the right internal jugular vein catheter suggesting a high-grade stenosis.  Details: Patient was taken to Cath Lab 8 after an IV was placed in the right arm.  We got dedicated venogram imaging of the right upper extremity under fluoroscopic guidance.  We did have him elevate his arm and do breath-hold to try and get better images.  We were able to evaluate the upper arm brachial, axillary, subclavian veins but ultimately there was no filling around a very diseased innominate vein around the catheter.  This suggested a high-grade stenosis.  He remained stable during the case.  Complication: None  Condition: Stable  Plan: Will schedule for right upper arm AV graft versus hero with Dr. Donzetta Matters and allow him to review the images.  Marty Heck, MD [facility] [place] Office: [place]

## 2022-11-02 ENCOUNTER — Other Ambulatory Visit: Payer: Self-pay

## 2022-11-02 DIAGNOSIS — N186 End stage renal disease: Secondary | ICD-10-CM

## 2022-11-04 NOTE — Progress Notes (Incomplete)
PCP - Juluis Mire NP ([place]) Cardiologist - *** EKG - 09/22/2022 Chest x-ray - 09/21/2022 ECHO - 05/20/2022 Cardiac Cath - ***  CPAP - ***  DM- Fasting Blood Sugar:  *** Checks Blood Sugar:  ***/day  Blood Thinner Instructions: N/A Aspirin Instructions: N/A ERAS Protcol - N/A.  NPO ordered COVID TEST- N/A  Anesthesia review: Yes  -------------  SDW INSTRUCTIONS:  Your procedure is scheduled on Tuesday Feb 6th. Please report to [facility] Main Entrance "A" at 7:00 A.M., and check in at the Admitting office. Call this number if you have problems the morning of surgery: [phone]   Remember: Do not eat or drink after midnight the night before your surgery    Medications to take morning of surgery with a sip of water include: amLODipine (NORVASC) 10 MG  cloNIDine (CATAPRES) 0.1 MG  IF NEEDED acetaminophen (TYLENOL) 500 MG    As of today, STOP taking any Aspirin (unless otherwise instructed by your surgeon), Aleve, Naproxen, Ibuprofen, Motrin, Advil, Goody's, BC's, all herbal medications, fish oil, and all vitamins.    The Morning of Surgery Do not wear jewelry. Do not wear lotions, powders, colognes, or deodorant Do not bring valuables to the hospital. [facility] is not responsible for any belongings or valuables.  If you are a smoker, DO NOT Smoke 24 hours prior to surgery  If you wear a CPAP at night please bring your mask the morning of surgery   Remember that you must have someone to transport you home after your surgery, and remain with you for 24 hours if you are discharged the same day.  Please bring cases for contacts, glasses, hearing aids, dentures or bridgework because it cannot be worn into surgery.   Patients discharged the day of surgery will not be allowed to drive home.   Please shower the NIGHT BEFORE/MORNING OF SURGERY (use antibacterial soap like DIAL soap if possible). Wear comfortable clothes the morning of  surgery. Oral Hygiene is also important to reduce your risk of infection.  Remember - BRUSH YOUR TEETH THE MORNING OF SURGERY WITH YOUR REGULAR TOOTHPASTE  Patient denies shortness of breath, fever, cough and chest pain.

## 2022-11-07 ENCOUNTER — Encounter (HOSPITAL_COMMUNITY): Payer: Self-pay | Admitting: Vascular Surgery

## 2022-11-07 NOTE — Anesthesia Preprocedure Evaluation (Deleted)
Anesthesia Evaluation    Reviewed: Allergy & Precautions, Patient's Chart, lab work & pertinent test results  Airway        Dental   Pulmonary neg pulmonary ROS, former smoker          Cardiovascular hypertension, + DOE    05/20/22 echo showed LVEF 45-50%, no RWMA, mild LVH, indeterminate LV diastolic parameters, normal RVSF, normal PASP, mild MR, trivial MI, aortic root 41 mm. 07/05/22 CCTA showed coronary calcium score 259 (98th percentile) with mild-moderate branch CAD with aggressive CV risk factor modification recommended. CT FFR added and CT FFR analysis did not show any significant stenosis.   Neuro/Psych  PSYCHIATRIC DISORDERS Anxiety Depression    negative neurological ROS     GI/Hepatic negative GI ROS,,,(+) Hepatitis -, B  Endo/Other  negative endocrine ROS    Renal/GU ESRFRenal disease     Musculoskeletal negative musculoskeletal ROS (+)    Abdominal   Peds  Hematology negative hematology ROS (+)   Anesthesia Other Findings   Reproductive/Obstetrics                             Anesthesia Physical Anesthesia Plan  ASA: 3  Anesthesia Plan:    Post-op Pain Management: Tylenol PO (pre-op)*   Induction:   PONV Risk Score and Plan:   Airway Management Planned:   Additional Equipment:   Intra-op Plan:   Post-operative Plan:   Informed Consent:   Plan Discussed with: Anesthesiologist  Anesthesia Plan Comments: (PAT note written 11/07/2022 by Myra Gianotti, PA-C.  )       Anesthesia Quick Evaluation

## 2022-11-07 NOTE — Progress Notes (Signed)
Anesthesia Chart Review: [name]  Case: [id number] Date/Time: 11/08/22 0921   Procedure: INSERTION OF RIGHT UPPER ARM ARTERIOVENOUS (AV) GORE-TEX GRAFT VERSUS HERO GRAFT (Right)   Anesthesia type: Choice   Pre-op diagnosis: ESRD   Location: [facility] OR ROOM 12 / [place] OR   Surgeons: Waynetta Sandy, MD       DISCUSSION: Patient is a 52 year old male scheduled for the above procedure. He has ESRD and has had "failed right radiocephalic AVF, left upper arm AV graft as well as bilateral thigh graft."  History includes former smoker (quit 03/21/86), HTN, ESRD, secondary hyperparathyroidism (s/p parathyroidectomy 04/24/14).   Last visit with cardiologist Dr. Claiborne Billings was on 05/04/22. A repeat echo ordered to re-evaluate systolic and diastolic function and well as previous valvular issues. A CCTA also ordered for precordial chest pain with coronary calcifications and CV comorbidities. 05/20/22 echo showed LVEF 45-50%, no RWMA, mild LVH, indeterminate LV diastolic parameters, normal RVSF, normal PASP, mild MR, trivial MI, aortic root 41 mm. 07/05/22 CCTA showed coronary calcium score 259 (98th percentile) with mild-moderate branch CAD with aggressive CV risk factor modification recommended. CT FFR added and CT FFR analysis did not show any significant stenosis.   Anesthesia team to evaluate on the day of surgery.   VS: Ht 6' (1.829 m)   Wt 65 kg   BMI 19.43 kg/m   BP Readings from Last 3 Encounters:  10/27/22 (!) 155/89  10/12/22 122/84  09/22/22 (!) 147/92   Pulse Readings from Last 3 Encounters:  10/27/22 (!) 50  10/12/22 70  09/22/22 64    PROVIDERS: Kerin Perna, NP is PCP  Shelva Majestic, MD is cardiologist   LABS: For day of surgery. H/H 14.3/42.0 on 10/27/22.    IMAGES: CXR 09/21/22: FINDINGS: Cardiac silhouette is unremarkable. No pneumothorax or pleural effusion. The lungs are clear. The visualized skeletal structures are unremarkable. Right IJ catheter tip distal  SVC. IMPRESSION: No acute cardiopulmonary process.   EKG: 09/21/22: SB at 58 bpm   CV: CT Coronary 07/05/22: IMPRESSION: 1. 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 CT FFR will be performed and reported separately. 2. Coronary calcium score of 259. This was 98th percentile for age and sex matched control. 3. Normal coronary origin with right dominance. 4. Aggressive cardiovascular risk factor modification recommended.  CT FFR 07/05/22: 1. Left Main: No significant stenosis. FFR = 1.00 2. LAD: No significant stenosis. Proximal FFR = 0.96, Mid FFR = 0.92, Distal FFR = 0.90, Distal D1 - 0.97 3. LCX: No significant stenosis. Proximal FFR = 0.99, Distal FFR = 0.95 4. RCA: No significant stenosis. Proximal FFR = 0.99, Mid FFR = 0.96 Distal FFR = 0.92 IMPRESSION: 1.  CT FFR analysis did not show any significant stenosis.    Echo 05/20/22: IMPRESSIONS   1. Left ventricular ejection fraction, by estimation, is 45 to 50%. The  left ventricle has mildly decreased function. The left ventricle has no  regional wall motion abnormalities. The left ventricular internal cavity  size was mildly dilated. There is  mild left ventricular hypertrophy. Left ventricular diastolic parameters  are indeterminate. The average left ventricular global longitudinal strain  is -21.9 %. The global longitudinal strain is normal.   2. Right ventricular systolic function is normal. The right ventricular  size is normal. There is normal pulmonary artery systolic pressure.   3. Left atrial size was mildly dilated.   4. The mitral valve is abnormal. Mild mitral valve regurgitation. No  evidence of mitral stenosis. There is mild holosystolic prolapse of  multiple segments of the anterior leaflet of the mitral valve.   5. The aortic valve is tricuspid. Aortic valve regurgitation is trivial.  No aortic stenosis is present.   6. Aortic dilatation noted. There is mild dilatation of the ascending  aorta, measuring  41 mm.   7. The inferior vena cava is normal in size with greater than 50%  respiratory variability, suggesting right atrial pressure of 3 mmHg.  - Comparison(s): Prior images reviewed side by side. Changes from prior [TTE} study are noted. 02/17/21 EF 55-60%. Mild-moderate MR. On current study, EF  less vigorous, valve regurgitation less prominent.    TEE 02/19/21: IMPRESSIONS   1. Left ventricular ejection fraction, by estimation, is 50 to 55%. The  left ventricle has low normal function. There is moderate left ventricular  hypertrophy.   2. Right ventricular systolic function is normal. The right ventricular  size is normal.   3. No left atrial/left atrial appendage thrombus was detected.   4. The mitral valve is abnormal. Moderate mitral valve regurgitation.  There is moderate late systolic prolapse of multiple segments of the  anterior leaflet of the mitral valve.   5. Tricuspid valve regurgitation is moderate.   6. The aortic valve is tricuspid. Aortic valve regurgitation is trivial.  - Conclusion(s)/Recommendation(s): No evidence of vegetation/infective  endocarditis on this transesophageal echocardiogram.    Past Medical History:  Diagnosis Date   Anemia    Anxiety    Depression    ESRD on hemodialysis ([place])    HD [facility] MWF   Hypertension    Thyroid disease     Past Surgical History:  Procedure Laterality Date   ARTERIOVENOUS GRAFT PLACEMENT Left    "forearm, it's not working; thigh"    ARTERIOVENOUS GRAFT PLACEMENT Left 11/09/2015   AV FISTULA PLACEMENT Right 01/10/2017   Procedure: INSERTION OF ARTERIOVENOUS Right thigh GORE-TEX GRAFT;  Surgeon: [name], MD;  Location: [facility];  Service: Vascular;  Laterality: Right;   FALSE ANEURYSM REPAIR Left 11/09/2015   Procedure: REPAIR OF LEFT FEMORAL PSEUDOANEURYSM; REVISION  OF LEFT THIGH ARTERIOVENOUS GRAFT USING 6MM X 10 CM GORETEX GRAFT ;  Surgeon: [name], MD;  Location: [facility] OR;  Service: Vascular;   Laterality: Left;   IR AV DIALY SHUNT INTRO NEEDLE/INTRACATH INITIAL W/PTA/IMG RIGHT Right 02/06/2019   IR AV DIALY SHUNT INTRO NEEDLE/INTRACATH INITIAL W/PTA/IMG RIGHT Right 05/14/2019   IR AV DIALY SHUNT INTRO NEEDLE/INTRACATH INITIAL W/PTA/IMG RIGHT Right 08/18/2020   IR AV DIALY SHUNT INTRO NEEDLE/INTRACATH INITIAL W/PTA/IMG RIGHT Right 04/21/2022   IR DIALY SHUNT INTRO NEEDLE/INTRACATH INITIAL W/IMG RIGHT Right 09/17/2019   IR DIALY SHUNT INTRO NEEDLE/INTRACATH INITIAL W/IMG RIGHT Right 12/10/2019   IR FLUORO GUIDE CV LINE RIGHT  07/10/2021   IR FLUORO GUIDE CV LINE RIGHT  06/27/2022   IR FLUORO GUIDE CV LINE RIGHT  08/06/2022   IR GENERIC HISTORICAL  07/15/2016   IR US GUIDE VASC ACCESS LEFT 07/15/2016 [name], MD [facility]   IR GENERIC HISTORICAL Left 07/15/2016   IR THROMBECTOMY AV FISTULA W/THROMBOLYSIS/PTA INC/SHUNT/IMG LEFT 07/15/2016 [name], MD [facility]   IR GENERIC HISTORICAL  10/05/2016   IR US GUIDE VASC ACCESS LEFT 10/05/2016 [name], MD [facility]   IR GENERIC HISTORICAL Left 10/05/2016   IR THROMBECTOMY AV FISTULA W/THROMBOLYSIS/PTA INC/SHUNT/IMG LEFT 10/05/2016 [name], MD [facility]   IR GENERIC HISTORICAL  10/08/2016   IR  FLUORO GUIDE CV LINE RIGHT 10/08/2016 [name], MD [facility]   IR GENERIC HISTORICAL  10/08/2016   IR US GUIDE VASC ACCESS RIGHT 10/08/2016 [name], MD [facility]   IR PTA AND STENT ADDL CENTRAL DIALY SEG THRU DIALY CIRCUIT RIGHT Right 12/10/2019   IR RADIOLOGIST EVAL & MGMT  11/26/2019   IR REMOVAL TUN CV CATH W/O FL  03/16/2017   IR REMOVAL TUN CV CATH W/O FL  07/22/2021   IR THROMBECTOMY AV FISTULA W/THROMBOLYSIS/PTA INC/SHUNT/IMG RIGHT Right 06/16/2018   IR THROMBECTOMY AV FISTULA W/THROMBOLYSIS/PTA INC/SHUNT/IMG RIGHT Right 06/17/2018   IR THROMBECTOMY AV FISTULA W/THROMBOLYSIS/PTA INC/SHUNT/IMG RIGHT Right 07/15/2021   IR THROMBECTOMY AV FISTULA W/THROMBOLYSIS/PTA INC/SHUNT/IMG RIGHT Right 06/29/2022   IR THROMBECTOMY AV  FISTULA W/THROMBOLYSIS/PTA INC/SHUNT/IMG RIGHT Right 07/05/2022   IR THROMBECTOMY AV FISTULA W/THROMBOLYSIS/PTA INC/SHUNT/IMG RIGHT Right 08/02/2022   IR US GUIDE VASC ACCESS RIGHT  06/17/2018   IR US GUIDE VASC ACCESS RIGHT  06/16/2018   IR US GUIDE VASC ACCESS RIGHT  02/06/2019   IR US GUIDE VASC ACCESS RIGHT  05/14/2019   IR US GUIDE VASC ACCESS RIGHT  09/17/2019   IR US GUIDE VASC ACCESS RIGHT  12/10/2019   IR US GUIDE VASC ACCESS RIGHT  08/18/2020   IR US GUIDE VASC ACCESS RIGHT  07/10/2021   IR US GUIDE VASC ACCESS RIGHT  07/19/2021   IR US GUIDE VASC ACCESS RIGHT  04/21/2022   IR US GUIDE VASC ACCESS RIGHT  06/27/2022   IR US GUIDE VASC ACCESS RIGHT  06/29/2022   IR US GUIDE VASC ACCESS RIGHT  07/05/2022   IR US GUIDE VASC ACCESS RIGHT  08/02/2022   IR US GUIDE VASC ACCESS RIGHT  08/06/2022   IVC VENOGRAPHY N/A 10/27/2022   Procedure: IVC Venography;  Surgeon: Marty Heck, MD;  Location: [facility] CV LAB;  Service: Cardiovascular;  Laterality: N/A;   PARATHYROIDECTOMY N/A 04/24/2014   Procedure: TOTAL 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;  Surgeon: [name], MD;  Location: [place];  Service: General;  Laterality: N/A;  NECK AND LEFT FOREARM   PERIPHERAL VASCULAR CATHETERIZATION N/A 05/05/2016   Procedure: A/V Shuntogram;  Surgeon: Conrad Guaynabo, MD;  Location: [place] CV LAB;  Service: Cardiovascular;  Laterality: N/A;   PERIPHERAL VASCULAR CATHETERIZATION Left 05/05/2016   Procedure: Peripheral Vascular Balloon Angioplasty;  Surgeon: Conrad Reserve, MD;  Location: [place] [facility] LAB;  Service: Cardiovascular;  Laterality: Left;   PSEUDOANEURYSM REPAIR Left 11/09/2015   TEE WITHOUT CARDIOVERSION N/A 02/19/2021   Procedure: TRANSESOPHAGEAL ECHOCARDIOGRAM (TEE);  Surgeon: [name], MD;  Location: [facility] ENDOSCOPY;  Service: Cardiovascular;  Laterality: N/A;   THROMBECTOMY / ARTERIOVENOUS GRAFT REVISION Left 08/18/2015   thigh   THROMBECTOMY AND REVISION OF ARTERIOVENTOUS  (AV) GORETEX  GRAFT Left 08/23/2015   Procedure: THROMBECTOMY AND REVISION OF ARTERIOVENTOUS (AV) GORETEX  GRAFT LEFT THIGH ;  Surgeon: [name], MD;  Location: [facility];  Service: Vascular;  Laterality: Left;   THROMBECTOMY W/ EMBOLECTOMY Left 08/18/2015   Procedure: THROMBECTOMY  AND REVISION ARTERIOVENOUS GORE-TEX GRAFT/LEFT THIGH;  Surgeon: [name], MD;  Location: [facility];  Service: Vascular;  Laterality: Left;   THROMBECTOMY W/ EMBOLECTOMY Right 06/19/2018   Procedure: THROMBECTOMY AND REVISION OF RIGHT THIGH  ARTERIOVENOUS GORE-TEX GRAFT;  Surgeon: [name], MD;  Location: [facility];  Service: Vascular;  Laterality: Right;   UPPER EXTREMITY VENOGRAPHY Bilateral 12/27/2016   Procedure: Upper Extremity Venography;  Surgeon: Durene Fruits  Pierre Bali, MD;  Location: [facility] CV LAB;  Service: Cardiovascular;  Laterality: Bilateral;   VENOGRAM Left 05/05/2016   Procedure: Venogram;  Surgeon: Conrad Veteran, MD;  Location: [place] CV LAB;  Service: Cardiovascular;  Laterality: Left;  lower extremity    MEDICATIONS: No current facility-administered medications for this encounter.    acetaminophen (TYLENOL) 500 MG tablet   amLODipine (NORVASC) 10 MG tablet   B Complex-C-Folic Acid (DIALYVITE TABLET) TABS   cloNIDine (CATAPRES) 0.1 MG tablet    0.9 %  sodium chloride infusion    Myra Gianotti, PA-C Surgical Short Stay/Anesthesiology [facility] Phone [phone] [facility] Phone [phone] 11/07/2022 12:59 PM

## 2022-11-07 NOTE — Progress Notes (Signed)
Unable to reach patient via phone after several attempts.  Left a detailed message on machine with information and instructions for DOS.   PCP - Juluis Mire, NP Cardiologist - Dr Shelva Majestic  Chest x-ray - 09/21/22 (2V) EKG - 09/21/22 Stress Test - n/a ECHO - 05/20/22 Cardiac Cath - n/a  ICD Pacemaker/Loop - n/a  Sleep Study -  n/a CPAP - none  Anesthesia review: Yes  STOP now taking any Aspirin (unless otherwise instructed by your surgeon), Aleve, Naproxen, Ibuprofen, Motrin, Advil, Goody's, BC's, all herbal medications, fish oil, and all vitamins.

## 2022-11-08 ENCOUNTER — Ambulatory Visit (HOSPITAL_COMMUNITY): Admission: RE | Admit: 2022-11-08 | Payer: Self-pay | Source: Home / Self Care | Admitting: Vascular Surgery

## 2022-11-08 SURGERY — INSERTION OF ARTERIOVENOUS (AV) GORE-TEX GRAFT ARM
Anesthesia: Choice | Laterality: Right

## 2022-11-10 ENCOUNTER — Emergency Department (HOSPITAL_COMMUNITY)
Admission: EM | Admit: 2022-11-10 | Discharge: 2022-11-10 | Disposition: A | Discharge status: Home / Self Care | Payer: Self-pay | Attending: Emergency Medicine | Admitting: Emergency Medicine

## 2022-11-10 ENCOUNTER — Other Ambulatory Visit: Payer: Self-pay

## 2022-11-10 ENCOUNTER — Encounter (HOSPITAL_COMMUNITY): Payer: Self-pay | Admitting: *Deleted

## 2022-11-10 ENCOUNTER — Emergency Department (HOSPITAL_COMMUNITY): Payer: Self-pay

## 2022-11-10 DIAGNOSIS — I10 Essential (primary) hypertension: Secondary | ICD-10-CM

## 2022-11-10 DIAGNOSIS — Z992 Dependence on renal dialysis: Secondary | ICD-10-CM | POA: Insufficient documentation

## 2022-11-10 DIAGNOSIS — R001 Bradycardia, unspecified: Secondary | ICD-10-CM | POA: Insufficient documentation

## 2022-11-10 DIAGNOSIS — Z79899 Other long term (current) drug therapy: Secondary | ICD-10-CM | POA: Insufficient documentation

## 2022-11-10 DIAGNOSIS — I120 Hypertensive chronic kidney disease with stage 5 chronic kidney disease or end stage renal disease: Secondary | ICD-10-CM | POA: Insufficient documentation

## 2022-11-10 DIAGNOSIS — N186 End stage renal disease: Secondary | ICD-10-CM

## 2022-11-10 DIAGNOSIS — R079 Chest pain, unspecified: Secondary | ICD-10-CM

## 2022-11-10 LAB — CBC WITH DIFFERENTIAL/PLATELET
Abs Immature Granulocytes: 0.01 K/uL (ref 0.00–0.07)
Basophils Absolute: 0 K/uL (ref 0.0–0.1)
Basophils Relative: 1 %
Eosinophils Absolute: 0 K/uL (ref 0.0–0.5)
Eosinophils Relative: 1 %
HCT: 40.4 % (ref 39.0–52.0)
Hemoglobin: 13.4 g/dL (ref 13.0–17.0)
Immature Granulocytes: 0 %
Lymphocytes Relative: 21 %
Lymphs Abs: 0.8 K/uL (ref 0.7–4.0)
MCH: 31 pg (ref 26.0–34.0)
MCHC: 33.2 g/dL (ref 30.0–36.0)
MCV: 93.5 fL (ref 80.0–100.0)
Monocytes Absolute: 0.5 K/uL (ref 0.1–1.0)
Monocytes Relative: 12 %
Neutro Abs: 2.3 K/uL (ref 1.7–7.7)
Neutrophils Relative %: 65 %
Platelets: 190 K/uL (ref 150–400)
RBC: 4.32 MIL/uL (ref 4.22–5.81)
RDW: 12.9 % (ref 11.5–15.5)
WBC: 3.6 K/uL — ABNORMAL LOW (ref 4.0–10.5)
nRBC: 0 % (ref 0.0–0.2)

## 2022-11-10 LAB — BASIC METABOLIC PANEL
Anion gap: 16 — ABNORMAL HIGH (ref 5–15)
BUN: 39 mg/dL — ABNORMAL HIGH (ref 6–20)
CO2: 24 mmol/L (ref 22–32)
Calcium: 9.2 mg/dL (ref 8.9–10.3)
Chloride: 97 mmol/L — ABNORMAL LOW (ref 98–111)
Creatinine, Ser: 11.23 mg/dL — ABNORMAL HIGH (ref 0.61–1.24)
GFR, Estimated: 5 mL/min — ABNORMAL LOW (ref >60)
Glucose, Bld: 85 mg/dL (ref 70–99)
Potassium: 5.2 mmol/L — ABNORMAL HIGH (ref 3.5–5.1)
Sodium: 137 mmol/L (ref 135–145)

## 2022-11-10 LAB — TROPONIN I (HIGH SENSITIVITY)
Troponin I (High Sensitivity): 15 ng/L
Troponin I (High Sensitivity): 14 ng/L (ref <18)

## 2022-11-10 NOTE — ED Provider Notes (Signed)
Patient seen after prior ED provider  Patient without clear evidence of ACS on workup.  EKG without evidence of acute ischemia.  Troponin x 2 is 15 and then 14.  This appears to be close to baseline.  Patient is reassured by ED workup.  Patient does understand need for outpatient follow-up both with nephrology and cardiology.     Valarie Merino, MD 11/10/22 1010

## 2022-11-10 NOTE — ED Provider Notes (Signed)
[facility] Provider Note   CSN: [id number] Arrival date & time: 11/10/22  0549     History  Chief Complaint  Patient presents with   Chest Pain    Dylon Ronnell [place] Lucken is a 52 y.o. male.  The history is provided by the patient.  Chest Pain He has history of hypertension, end-stage renal disease on hemodialysis and has been having episodes of left-sided chest pain intermittently for the last several months.  He notices that it seems to be worse when he is at work and exerting himself and seems to be better with rest.  Episodes of pain will last a few minutes before resolving.  There is no associated dyspnea, nausea, diaphoresis.  He describes a pinching feeling in the left chest but some discomfort radiates up into his neck and in the back of his head.  He also endorses some intermittent dizziness like he was drunk.  He was seen in the emergency department for similar symptoms with no cause found.  He is a non-smoker and denies history of diabetes, hyperlipidemia, family history of premature coronary atherosclerosis.  There has been no history of recent travel, no history of thromboembolic disease.   Home Medications Prior to Admission medications   Medication Sig Start Date End Date Taking? Authorizing Provider  acetaminophen (TYLENOL) 500 MG tablet Take 1,000 mg by mouth daily as needed (for pain or headaches).    [provider]  amLODipine (NORVASC) 10 MG tablet Take 10 mg by mouth daily.    [provider]  B Complex-C-Folic Acid (DIALYVITE TABLET) TABS Take 1 tablet by mouth daily.    [provider]  cloNIDine (CATAPRES) 0.1 MG tablet Take 0.1 mg by mouth 2 (two) times daily.    [provider]  fluticasone (FLONASE) 50 MCG/ACT nasal spray Place 2 sprays into both nostrils daily. Patient not taking: Reported on 06/15/2018 07/25/17 02/20/20  Larene Pickett, PA-C      Allergies    Ivp dye [iodinated  contrast media], Lisinopril, and Solu-medrol [methylprednisolone]    Review of Systems   Review of Systems  Cardiovascular:  Positive for chest pain.  All other systems reviewed and are negative.   Physical Exam Updated Vital Signs BP (!) 164/93   Pulse 78   Temp (!) 97.5 F (36.4 C)   Resp 15   Ht 6' (1.829 m)   Wt 65 kg   BMI 19.43 kg/m  Physical Exam Vitals and nursing note reviewed.   52 year old male, resting comfortably and in no acute distress. Vital signs are significant for elevated blood pressure. Oxygen saturation is 100%, which is normal. Head is normocephalic and atraumatic. PERRLA, EOMI. Oropharynx is clear. Neck is nontender and supple without adenopathy or JVD. Back is nontender and there is no CVA tenderness. Lungs are clear without rales, wheezes, or rhonchi. Chest is nontender.  Dialysis access catheters present in the right subclavian area. Heart has regular rate and rhythm without murmur. Abdomen is soft, flat, nontender. Extremities have no cyanosis or edema, full range of motion is present.  Clotted AV shunts are present in the left arm. Skin is warm and dry without rash. Neurologic: Mental status is normal, cranial nerves are intact, moves all extremities equally.  ED Results / Procedures / Treatments   Labs (all labs ordered are listed, but only abnormal results are displayed) Labs Reviewed  BASIC METABOLIC PANEL  CBC WITH DIFFERENTIAL/PLATELET  TROPONIN I (HIGH SENSITIVITY)  EKG EKG Interpretation  Date/Time:  Thursday November 10 2022 06:11:35 EST Ventricular Rate:  56 PR Interval:  190 QRS Duration: 88 QT Interval:  448 QTC Calculation: 432 R Axis:   62 Text Interpretation: Sinus bradycardia Minimal voltage criteria for LVH, may be normal variant ( Sokolow-Lyon ) Borderline ECG When compared with ECG of 21-Sep-2022 09:44, No significant change was found Confirmed by Delora Fuel ([id number]) on 11/10/2022 6:19:19 AM  Radiology No results  found.  Procedures Procedures  Cardiac monitor shows normal sinus rhythm, per my interpretation.  Medications Ordered in ED Medications - No data to display  ED Course/ Medical Decision Making/ A&P                             Medical Decision Making Amount and/or Complexity of Data Reviewed Labs: ordered. Radiology: ordered.   Chest pain which seems quite atypical, low index of suspicion for ACS, low index of suspicion for pulmonary embolism, pericarditis.  I have reviewed and interpreted his electrocardiogram and my interpretation is sinus bradycardia with left ventricular hypertrophy unchanged from prior.  I have reviewed his past records, and he had an ED visit on 09/21/2022 for nonspecific chest pain and negative workup.  I have ordered chest x-ray, CBC, basic metabolic panel, troponin x 2.  I anticipate cardiology referral if workup is negative.  Case is signed out to Dr. Francia Greaves, oncoming physician  Final Clinical Impression(s) / ED Diagnoses Final diagnoses:  Nonspecific chest pain  Elevated blood pressure reading with diagnosis of hypertension  End-stage renal disease on hemodialysis [facility])    Rx / DC Orders ED Discharge Orders     None         Delora Fuel, MD 42/70/62 [phone]

## 2022-11-10 NOTE — ED Notes (Signed)
Pt transported to xray 

## 2022-11-10 NOTE — ED Triage Notes (Signed)
The pt is a dialysis pt that was dialyzed yesterday  he has chest pain intermittently for one week  he has a rt upper chest dialysis catheter  he is also c/o some lt arm and head pain

## 2022-11-10 NOTE — Discharge Instructions (Addendum)
Return for any problem.  ?

## 2022-12-14 ENCOUNTER — Encounter (INDEPENDENT_AMBULATORY_CARE_PROVIDER_SITE_OTHER): Payer: Self-pay

## 2023-02-08 ENCOUNTER — Other Ambulatory Visit (HOSPITAL_BASED_OUTPATIENT_CLINIC_OR_DEPARTMENT_OTHER): Payer: Self-pay

## 2023-02-08 MED ORDER — SEVELAMER CARBONATE 800 MG PO TABS
800.0000 mg | ORAL_TABLET | Freq: Three times a day (TID) | ORAL | 11 refills | Status: DC
  Filled 2023-02-08: qty 30, 10d supply, fill #0

## 2023-02-10 ENCOUNTER — Other Ambulatory Visit (HOSPITAL_BASED_OUTPATIENT_CLINIC_OR_DEPARTMENT_OTHER): Payer: Self-pay

## 2023-02-15 ENCOUNTER — Other Ambulatory Visit (HOSPITAL_BASED_OUTPATIENT_CLINIC_OR_DEPARTMENT_OTHER): Payer: Self-pay

## 2023-03-20 ENCOUNTER — Other Ambulatory Visit (HOSPITAL_COMMUNITY): Payer: Self-pay

## 2023-03-20 MED ORDER — SEVELAMER CARBONATE 800 MG PO TABS
800.0000 mg | ORAL_TABLET | Freq: Three times a day (TID) | ORAL | 3 refills | Status: AC
  Filled 2023-03-20: qty 270, 90d supply, fill #0

## 2023-03-24 ENCOUNTER — Other Ambulatory Visit (HOSPITAL_COMMUNITY): Payer: Self-pay

## 2023-03-30 ENCOUNTER — Other Ambulatory Visit (HOSPITAL_COMMUNITY): Payer: Self-pay

## 2023-06-21 ENCOUNTER — Telehealth (HOSPITAL_COMMUNITY): Payer: Self-pay | Admitting: *Deleted

## 2023-06-21 NOTE — Telephone Encounter (Signed)
Received urgent fax from Dr Crista Elliot requesting new access placement. Will give to [facility].

## 2023-06-22 NOTE — Telephone Encounter (Addendum)
Patient had a RUE venography on 10/27/22 to evaluate for access placement. It was recommended to schedule for a right upper arm AV graft versus HeRO graft. He no showed for surgery. We received an urgent request from Dr. Ronalee Belts for new access placement. He will need cardiology clearance prior based on ED visit for chest pain in Feb. Clearance faxed to Dr. Tresa Endo. Since he was last seen over 6 months ago, will verify with Dr. Randie Heinz if patient need an office visit or OK to schedule once cleared.

## 2023-06-23 ENCOUNTER — Telehealth: Payer: Self-pay

## 2023-06-23 NOTE — Telephone Encounter (Signed)
..  Pre-operative Risk Assessment    Patient Name: [name]  DOB: 13-Feb-1971 MRN: [id number]      Request for Surgical Clearance    Procedure:   RIGHT UPPER ARM GRAFT VS HERO GRAFT  Date of Surgery:  Clearance TBD                                 Surgeon: DR Melven Sartorius [facility] Surgeon's Group or Practice Name:   Phone number:  [phone] Fax number:  [phone]   Type of Clearance Requested:   - Medical    Type of Anesthesia:  MAC   Additional requests/questions:   LAST O/V 05/04/22  Signed, Renee Ramus   06/23/2023, 1:10 PM

## 2023-06-26 ENCOUNTER — Telehealth: Payer: Self-pay | Admitting: Vascular Surgery

## 2023-06-26 NOTE — Telephone Encounter (Signed)
Name: [name]  DOB: 1970-10-19  MRN: [id number]  Primary Cardiologist: None  Chart reviewed as part of pre-operative protocol coverage. Because of [name]'s past medical history and time since last visit, he will require a follow-up in-office visit in order to better assess preoperative cardiovascular risk.  Pre-op covering staff: - Please schedule appointment and call patient to inform them. If patient already had an upcoming appointment within acceptable timeframe, please add "pre-op clearance" to the appointment notes so provider is aware. - Please contact requesting surgeon's office via preferred method (i.e, phone, fax) to inform them of need for appointment prior to surgery.   Carlos Levering, NP  06/26/2023, 12:47 PM

## 2023-06-26 NOTE — Telephone Encounter (Signed)
Provider ok to see PT w/o study

## 2023-06-26 NOTE — Progress Notes (Deleted)
[facility] OF [place]  ASSESSMENT / PLAN: 52 y.o. male who has exhausted conventional dialysis access options. He has central venous stenosis.  CHIEF COMPLAINT: ***  HISTORY OF PRESENT ILLNESS: [name] is a 52 y.o. male ***  VASCULAR SURGICAL HISTORY: ***  VASCULAR RISK FACTORS: {FINDINGS; POSITIVE NEGATIVE:438-646-2122} history of stroke / transient ischemic attack. {FINDINGS; POSITIVE NEGATIVE:[id number]} history of coronary artery disease. *** history of PCI. *** history of CABG.  {FINDINGS; POSITIVE NEGATIVE:[id number]} history of diabetes mellitus. Last A1c ***. {FINDINGS; POSITIVE NEGATIVE:438-646-2122} history of smoking. *** actively smoking. {FINDINGS; POSITIVE NEGATIVE:438-646-2122} history of hypertension. *** drug regimen with *** control. {FINDINGS; POSITIVE NEGATIVE:438-646-2122} history of chronic kidney disease.  Last GFR ***. CKD {stage:30421363}. {FINDINGS; POSITIVE NEGATIVE:[id number]} history of chronic obstructive pulmonary disease, treated with ***.  FUNCTIONAL STATUS: ECOG performance status: {findings; ecog performance status:31780} Ambulatory status: {TNHAmbulation:25868}  CAREY 1 AND 3 YEAR INDEX Male (2pts) 75-79 or 80-84 (2pts) >84 (3pts) Dependence in toileting (1pt) Partial or full dependence in dressing (1pt) History of malignant neoplasm (2pts) CHF (3pts) COPD (1pts) CKD (3pts)  0-3 pts 6% 1 year mortality ; 21% 3 year mortality 4-5 pts 12% 1 year mortality ; 36% 3 year mortality >5 pts 21% 1 year mortality; 54% 3 year mortality   Past Medical History:  Diagnosis Date   Anemia    Anxiety    Depression    ESRD on hemodialysis (HCC)    HD [place] MWF   Hypertension    Thyroid disease     Past Surgical History:  Procedure Laterality Date   ARTERIOVENOUS GRAFT PLACEMENT Left    "forearm, it's not working; thigh"    ARTERIOVENOUS GRAFT PLACEMENT Left 11/09/2015   AV FISTULA PLACEMENT Right  01/10/2017   Procedure: INSERTION OF ARTERIOVENOUS Right thigh GORE-TEX GRAFT;  Surgeon: Sherren Kerns, MD;  Location: [facility] OR;  Service: Vascular;  Laterality: Right;   FALSE ANEURYSM REPAIR Left 11/09/2015   Procedure: REPAIR OF LEFT FEMORAL PSEUDOANEURYSM; REVISION  OF LEFT THIGH ARTERIOVENOUS GRAFT USING X 10 CM GORETEX GRAFT ;  Surgeon: Larina Earthly, MD;  Location: [facility] OR;  Service: Vascular;  Laterality: Left;   IR AV DIALY SHUNT INTRO NEEDLE/INTRACATH INITIAL W/PTA/IMG RIGHT Right 02/06/2019   IR AV DIALY SHUNT INTRO NEEDLE/INTRACATH INITIAL W/PTA/IMG RIGHT Right 05/14/2019   IR AV DIALY SHUNT INTRO NEEDLE/INTRACATH INITIAL W/PTA/IMG RIGHT Right 08/18/2020   IR AV DIALY SHUNT INTRO NEEDLE/INTRACATH INITIAL W/PTA/IMG RIGHT Right 04/21/2022   IR DIALY SHUNT INTRO NEEDLE/INTRACATH INITIAL W/IMG RIGHT Right 09/17/2019   IR DIALY SHUNT INTRO NEEDLE/INTRACATH INITIAL W/IMG RIGHT Right 12/10/2019   IR FLUORO GUIDE CV LINE RIGHT  07/10/2021   IR FLUORO GUIDE CV LINE RIGHT  06/27/2022   IR FLUORO GUIDE CV LINE RIGHT  08/06/2022   IR GENERIC HISTORICAL  07/15/2016   IR US GUIDE VASC ACCESS LEFT 07/15/2016 Simonne Come, MD [facility]   IR GENERIC HISTORICAL Left 07/15/2016   IR THROMBECTOMY AV FISTULA W/THROMBOLYSIS/PTA INC/SHUNT/IMG LEFT 07/15/2016 Simonne Come, MD [facility]   IR GENERIC HISTORICAL  10/05/2016   IR US GUIDE VASC ACCESS LEFT 10/05/2016 Berdine Dance, MD [facility]   IR GENERIC HISTORICAL Left 10/05/2016   IR THROMBECTOMY AV FISTULA W/THROMBOLYSIS/PTA INC/SHUNT/IMG LEFT 10/05/2016 Berdine Dance, MD [facility]   IR GENERIC HISTORICAL  10/08/2016   IR FLUORO GUIDE CV LINE RIGHT 10/08/2016 Berdine Dance, MD [facility]   IR GENERIC HISTORICAL  10/08/2016   IR US GUIDE VASC  ACCESS RIGHT 10/08/2016 Berdine Dance, MD [facility]   IR PTA AND STENT ADDL CENTRAL DIALY SEG THRU DIALY CIRCUIT RIGHT Right 12/10/2019   IR RADIOLOGIST EVAL & MGMT  11/26/2019   IR REMOVAL TUN CV CATH W/O FL  03/16/2017    IR REMOVAL TUN CV CATH W/O FL  07/22/2021   IR THROMBECTOMY AV FISTULA W/THROMBOLYSIS/PTA INC/SHUNT/IMG RIGHT Right 06/16/2018   IR THROMBECTOMY AV FISTULA W/THROMBOLYSIS/PTA INC/SHUNT/IMG RIGHT Right 06/17/2018   IR THROMBECTOMY AV FISTULA W/THROMBOLYSIS/PTA INC/SHUNT/IMG RIGHT Right 07/15/2021   IR THROMBECTOMY AV FISTULA W/THROMBOLYSIS/PTA INC/SHUNT/IMG RIGHT Right 06/29/2022   IR THROMBECTOMY AV FISTULA W/THROMBOLYSIS/PTA INC/SHUNT/IMG RIGHT Right 07/05/2022   IR THROMBECTOMY AV FISTULA W/THROMBOLYSIS/PTA INC/SHUNT/IMG RIGHT Right 08/02/2022   IR US GUIDE VASC ACCESS RIGHT  06/17/2018   IR US GUIDE VASC ACCESS RIGHT  06/16/2018   IR US GUIDE VASC ACCESS RIGHT  02/06/2019   IR US GUIDE VASC ACCESS RIGHT  05/14/2019   IR US GUIDE VASC ACCESS RIGHT  09/17/2019   IR US GUIDE VASC ACCESS RIGHT  12/10/2019   IR US GUIDE VASC ACCESS RIGHT  08/18/2020   IR US GUIDE VASC ACCESS RIGHT  07/10/2021   IR US GUIDE VASC ACCESS RIGHT  07/19/2021   IR US GUIDE VASC ACCESS RIGHT  04/21/2022   IR US GUIDE VASC ACCESS RIGHT  06/27/2022   IR US GUIDE VASC ACCESS RIGHT  06/29/2022   IR US GUIDE VASC ACCESS RIGHT  07/05/2022   IR US GUIDE VASC ACCESS RIGHT  08/02/2022   IR US GUIDE VASC ACCESS RIGHT  08/06/2022   IVC VENOGRAPHY N/A 10/27/2022   Procedure: IVC Venography;  Surgeon: Cephus Shelling, MD;  Location: [facility] INVASIVE CV LAB;  Service: Cardiovascular;  Laterality: N/A;   PARATHYROIDECTOMY N/A 04/24/2014   Procedure: TOTAL 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;  Surgeon: Velora Heckler, MD;  Location: [facility] OR;  Service: General;  Laterality: N/A;  NECK AND LEFT FOREARM   PERIPHERAL VASCULAR CATHETERIZATION N/A 05/05/2016   Procedure: A/V Shuntogram;  Surgeon: Fransisco Hertz, MD;  Location: [facility] INVASIVE CV LAB;  Service: Cardiovascular;  Laterality: N/A;   PERIPHERAL VASCULAR CATHETERIZATION Left 05/05/2016   Procedure: Peripheral Vascular Balloon Angioplasty;  Surgeon: Fransisco Hertz, MD;  Location: [facility] INVASIVE CV  LAB;  Service: Cardiovascular;  Laterality: Left;   PSEUDOANEURYSM REPAIR Left 11/09/2015   TEE WITHOUT CARDIOVERSION N/A 02/19/2021   Procedure: TRANSESOPHAGEAL ECHOCARDIOGRAM (TEE);  Surgeon: Chrystie Nose, MD;  Location: [facility] ENDOSCOPY;  Service: Cardiovascular;  Laterality: N/A;   THROMBECTOMY / ARTERIOVENOUS GRAFT REVISION Left 08/18/2015   thigh   THROMBECTOMY AND REVISION OF ARTERIOVENTOUS (AV) GORETEX  GRAFT Left 08/23/2015   Procedure: THROMBECTOMY AND REVISION OF ARTERIOVENTOUS (AV) GORETEX  GRAFT LEFT THIGH ;  Surgeon: Chuck Hint, MD;  Location: [facility] OR;  Service: Vascular;  Laterality: Left;   THROMBECTOMY W/ EMBOLECTOMY Left 08/18/2015   Procedure: THROMBECTOMY  AND REVISION ARTERIOVENOUS GORE-TEX GRAFT/LEFT THIGH;  Surgeon: Nada Libman, MD;  Location: [facility] OR;  Service: Vascular;  Laterality: Left;   THROMBECTOMY W/ EMBOLECTOMY Right 06/19/2018   Procedure: THROMBECTOMY AND REVISION OF RIGHT THIGH  ARTERIOVENOUS GORE-TEX GRAFT;  Surgeon: Chuck Hint, MD;  Location: [facility] OR;  Service: Vascular;  Laterality: Right;   UPPER EXTREMITY VENOGRAPHY Bilateral 12/27/2016   Procedure: Upper Extremity Venography;  Surgeon: Nada Libman, MD;  Location: [facility] INVASIVE CV LAB;  Service: Cardiovascular;  Laterality: Bilateral;   VENOGRAM Left 05/05/2016   Procedure: Venogram;  Surgeon: Fransisco Hertz, MD;  Location: [facility] INVASIVE CV LAB;  Service: Cardiovascular;  Laterality: Left;  lower extremity    Family History  Problem Relation Age of Onset   Renal Disease Neg Hx     Social History   Socioeconomic History   Marital status: Married    Spouse name: Not on file   Number of children: Not on file   Years of education: Not on file   Highest education level: Not on file  Occupational History   Not on file  Tobacco Use   Smoking status: Former    Current packs/day: 0.00    Types: Cigarettes    Quit date: 03/21/1986    Years since quitting: 37.2   Smokeless tobacco:  Never  Vaping Use   Vaping status: Never Used  Substance and Sexual Activity   Alcohol use: No    Alcohol/week: 0.0 standard drinks of alcohol   Drug use: No   Sexual activity: Not on file  Other Topics Concern   Not on file  Social History Narrative   Not on file   Social Determinants of Health   Financial Resource Strain: Not on file  Food Insecurity: No Food Insecurity (08/05/2022)   Hunger Vital Sign    Worried About Running Out of Food in the Last Year: Never true    Ran Out of Food in the Last Year: Never true  Transportation Needs: No Transportation Needs (08/05/2022)   PRAPARE - Administrator, Civil Service (Medical): No    Lack of Transportation (Non-Medical): No  Physical Activity: Not on file  Stress: Not on file  Social Connections: Not on file  Intimate Partner Violence: Not At Risk (08/05/2022)   Humiliation, Afraid, Rape, and Kick questionnaire    Fear of Current or Ex-Partner: No    Emotionally Abused: No    Physically Abused: No    Sexually Abused: No    Allergies  Allergen Reactions   Ivp Dye [Iodinated Contrast Media] Swelling    SWELLING REACTION UNSPECIFIED    Lisinopril Cough   Solu-Medrol [Methylprednisolone] Nausea And Vomiting    Current Outpatient Medications  Medication Sig Dispense Refill   acetaminophen (TYLENOL) 500 MG tablet Take 1,000 mg by mouth daily as needed (for pain or headaches).     amLODipine (NORVASC) 10 MG tablet Take 10 mg by mouth daily.     B Complex-C-Folic Acid (DIALYVITE TABLET) TABS Take 1 tablet by mouth daily.     cloNIDine (CATAPRES) 0.1 MG tablet Take 0.1 mg by mouth 2 (two) times daily.     sevelamer carbonate (RENVELA) 800 MG tablet Take 1 tablet (800 mg total) by mouth 3 (three) times daily with meals. 30 tablet 11   sevelamer carbonate (RENVELA) 800 MG tablet Take 1 tablet (800 mg total) by mouth 3 (three) times daily with meals. 270 tablet 3   No current facility-administered medications for this  visit.   Facility-Administered Medications Ordered in Other Visits  Medication Dose Route Frequency Provider Last Rate Last Admin   0.9 %  sodium chloride infusion   Intravenous Continuous Ralene Muskrat, PA-C   New Bag at 02/19/21 1347    PHYSICAL EXAM There were no vitals filed for this visit.  Constitutional: *** appearing. *** distress. Appears *** nourished.  Neurologic: CN ***. *** focal findings. *** sensory loss. Psychiatric: *** Mood and affect symmetric and appropriate. Eyes: *** No icterus. No conjunctival pallor. Ears, nose, throat: *** mucous membranes moist. Midline trachea.  Cardiac: *** rate and rhythm.  Respiratory: *** unlabored. Abdominal: *** soft, non-tender, non-distended.  Peripheral vascular: *** Extremity: *** edema. *** cyanosis. *** pallor.  Skin: *** gangrene. *** ulceration.  Lymphatic: *** Stemmer's sign. *** palpable lymphadenopathy.    PERTINENT LABORATORY AND RADIOLOGIC DATA  Most recent CBC    Latest Ref Rng & Units 11/10/2022    6:16 AM 10/27/2022   10:06 AM 10/27/2022    9:46 AM  CBC  WBC 4.0 - 10.5 K/uL 3.6     Hemoglobin 13.0 - 17.0 g/dL 16.1  09.6  04.5   Hematocrit 39.0 - 52.0 % 40.4  42.0  40.0   Platelets 150 - 400 K/uL 190        Most recent CMP    Latest Ref Rng & Units 11/10/2022    6:16 AM 10/27/2022   10:06 AM 10/27/2022    9:46 AM  CMP  Glucose 70 - 99 mg/dL 85  80  81   BUN 6 - 20 mg/dL 39  50  81   Creatinine 0.61 - 1.24 mg/dL 40.98  11.91  47.82   Sodium 135 - 145 mmol/L 137  137  133   Potassium 3.5 - 5.1 mmol/L 5.2  4.9  8.2   Chloride 98 - 111 mmol/L 97  99  101   CO2 22 - 32 mmol/L 24     Calcium 8.9 - 10.3 mg/dL 9.2       Renal function CrCl cannot be calculated (Patient's most recent lab result is older than the maximum 21 days allowed.).  No results found for: "HGBA1C"  No results found for: "LDLCALC", "LDLC", "HIRISKLDL", "POCLDL", "LDLDIRECT", "REALLDLC", "TOTLDLC"   Vascular Imaging: ***  Brynda Heick N.  Lenell Antu, MD FACS [facility] Phone Number: [phone] 06/26/2023 9:04 PM   Total time spent on preparing this encounter including chart review, data review, collecting history, examining the patient, coordinating care for this {tnhtimebilling:26202}  Portions of this report may have been transcribed using voice recognition software.  Every effort has been made to ensure accuracy; however, inadvertent computerized transcription errors may still be present.

## 2023-06-26 NOTE — Telephone Encounter (Signed)
1st attempt at scheduling in-office appt. Lvmtrc.

## 2023-06-26 NOTE — Telephone Encounter (Signed)
Per Dr. Randie Heinz, patient needs an office visit with dialysis duplex to evaluate for access placement. Information sent to scheduling to contact patient for appointment.

## 2023-06-27 ENCOUNTER — Ambulatory Visit: Payer: Self-pay | Admitting: Vascular Surgery

## 2023-06-28 NOTE — Telephone Encounter (Signed)
2nd attempt to reach pt to schedule IN OFFICE visit for preop clearance. Lvm

## 2023-06-29 NOTE — Telephone Encounter (Signed)
Our office has attempted x 3 to reach pt to schedule an in office appt. I will update the surgeon's office pt needs in office appt. Will remove from the pre op call back pool.

## 2023-07-11 ENCOUNTER — Telehealth: Payer: Self-pay

## 2023-07-11 NOTE — Telephone Encounter (Signed)
Attempted to reach patient in regards to missed appointment with Dr. Lenell Antu and attempts made by cardiology to schedule appointment for evaluation for cardiac clearance to schedule a right upper arm graft vs HERO graft. Unable to reach patient or leave VM.   Spoke with Lurena Joiner at [facility] States patient is back in-center on tomorrow and she will have a discussion with him. Both contact numbers for [facility] and [facility] provided.

## 2023-11-24 ENCOUNTER — Other Ambulatory Visit (HOSPITAL_COMMUNITY): Payer: Self-pay

## 2023-11-24 MED ORDER — AMLODIPINE BESYLATE 10 MG PO TABS
10.0000 mg | ORAL_TABLET | Freq: Every evening | ORAL | 3 refills | Status: DC
  Filled 2023-11-24: qty 90, 90d supply, fill #0

## 2023-12-06 ENCOUNTER — Other Ambulatory Visit (HOSPITAL_COMMUNITY): Payer: Self-pay

## 2024-05-21 ENCOUNTER — Encounter: Payer: Self-pay | Admitting: Cardiovascular Disease

## 2024-06-12 ENCOUNTER — Emergency Department (HOSPITAL_COMMUNITY): Payer: Self-pay

## 2024-06-12 ENCOUNTER — Other Ambulatory Visit: Payer: Self-pay

## 2024-06-12 ENCOUNTER — Observation Stay (HOSPITAL_COMMUNITY)
Admission: EM | Admit: 2024-06-12 | Discharge: 2024-06-14 | Disposition: A | Discharge status: Home / Self Care | Payer: Self-pay | Attending: Emergency Medicine | Admitting: Emergency Medicine

## 2024-06-12 ENCOUNTER — Encounter (HOSPITAL_COMMUNITY): Payer: Self-pay

## 2024-06-12 DIAGNOSIS — D631 Anemia in chronic kidney disease: Secondary | ICD-10-CM | POA: Insufficient documentation

## 2024-06-12 DIAGNOSIS — Z992 Dependence on renal dialysis: Secondary | ICD-10-CM | POA: Insufficient documentation

## 2024-06-12 DIAGNOSIS — N186 End stage renal disease: Secondary | ICD-10-CM | POA: Insufficient documentation

## 2024-06-12 DIAGNOSIS — I132 Hypertensive heart and chronic kidney disease with heart failure and with stage 5 chronic kidney disease, or end stage renal disease: Secondary | ICD-10-CM | POA: Insufficient documentation

## 2024-06-12 DIAGNOSIS — D696 Thrombocytopenia, unspecified: Secondary | ICD-10-CM

## 2024-06-12 DIAGNOSIS — D649 Anemia, unspecified: Secondary | ICD-10-CM | POA: Insufficient documentation

## 2024-06-12 DIAGNOSIS — Z79899 Other long term (current) drug therapy: Secondary | ICD-10-CM | POA: Insufficient documentation

## 2024-06-12 DIAGNOSIS — R079 Chest pain, unspecified: Secondary | ICD-10-CM

## 2024-06-12 DIAGNOSIS — K5730 Diverticulosis of large intestine without perforation or abscess without bleeding: Secondary | ICD-10-CM | POA: Insufficient documentation

## 2024-06-12 DIAGNOSIS — I700 Atherosclerosis of aorta: Secondary | ICD-10-CM | POA: Insufficient documentation

## 2024-06-12 DIAGNOSIS — I5022 Chronic systolic (congestive) heart failure: Principal | ICD-10-CM | POA: Insufficient documentation

## 2024-06-12 DIAGNOSIS — I959 Hypotension, unspecified: Principal | ICD-10-CM | POA: Diagnosis present

## 2024-06-12 LAB — IRON AND TIBC
Iron: 42 ug/dL — ABNORMAL LOW (ref 45–182)
Saturation Ratios: 20 % (ref 17.9–39.5)
TIBC: 214 ug/dL — ABNORMAL LOW (ref 250–450)
UIBC: 172 ug/dL

## 2024-06-12 LAB — CBC
HCT: 24.1 % — ABNORMAL LOW (ref 39.0–52.0)
Hemoglobin: 7.6 g/dL — ABNORMAL LOW (ref 13.0–17.0)
MCH: 31.1 pg (ref 26.0–34.0)
MCHC: 31.5 g/dL (ref 30.0–36.0)
MCV: 98.8 fL (ref 80.0–100.0)
Platelets: 119 K/uL — ABNORMAL LOW (ref 150–400)
RBC: 2.44 MIL/uL — ABNORMAL LOW (ref 4.22–5.81)
RDW: 13.6 % (ref 11.5–15.5)
WBC: 2 K/uL — ABNORMAL LOW (ref 4.0–10.5)
nRBC: 0 % (ref 0.0–0.2)

## 2024-06-12 LAB — COMPREHENSIVE METABOLIC PANEL WITH GFR
ALT: 10 U/L (ref 0–44)
AST: 17 U/L (ref 15–41)
Albumin: 3 g/dL — ABNORMAL LOW (ref 3.5–5.0)
Alkaline Phosphatase: 61 U/L (ref 38–126)
Anion gap: 11 (ref 5–15)
BUN: 30 mg/dL — ABNORMAL HIGH (ref 6–20)
CO2: 29 mmol/L (ref 22–32)
Calcium: 7.6 mg/dL — ABNORMAL LOW (ref 8.9–10.3)
Chloride: 99 mmol/L (ref 98–111)
Creatinine, Ser: 11.13 mg/dL — ABNORMAL HIGH (ref 0.61–1.24)
GFR, Estimated: 5 mL/min — ABNORMAL LOW (ref >60)
Glucose, Bld: 101 mg/dL — ABNORMAL HIGH (ref 70–99)
Potassium: 3 mmol/L — ABNORMAL LOW (ref 3.5–5.1)
Sodium: 139 mmol/L (ref 135–145)
Total Bilirubin: 0.5 mg/dL (ref 0.0–1.2)
Total Protein: 6.8 g/dL (ref 6.5–8.1)

## 2024-06-12 LAB — FERRITIN: Ferritin: 253 ng/mL (ref 24–336)

## 2024-06-12 LAB — PROTIME-INR
INR: 1 (ref 0.8–1.2)
Prothrombin Time: 13.6 s (ref 11.4–15.2)

## 2024-06-12 LAB — RETICULOCYTES
Immature Retic Fract: 29.1 % — ABNORMAL HIGH (ref 2.3–15.9)
RBC.: 4.02 MIL/uL — ABNORMAL LOW (ref 4.22–5.81)
Retic Count, Absolute: 95.3 K/uL (ref 19.0–186.0)
Retic Ct Pct: 2.4 % (ref 0.4–3.1)

## 2024-06-12 LAB — POC OCCULT BLOOD, ED: Fecal Occult Bld: NEGATIVE

## 2024-06-12 LAB — I-STAT CG4 LACTIC ACID, ED: Lactic Acid, Venous: 1.1 mmol/L (ref 0.5–1.9)

## 2024-06-12 LAB — TROPONIN I (HIGH SENSITIVITY)
Troponin I (High Sensitivity): 13 ng/L (ref <18)
Troponin I (High Sensitivity): 12 ng/L (ref <18)

## 2024-06-12 LAB — VITAMIN B12: Vitamin B-12: 779 pg/mL (ref 180–914)

## 2024-06-12 LAB — FOLATE: Folate: 14 ng/mL (ref >5.9)

## 2024-06-12 LAB — MAGNESIUM: Magnesium: 2.4 mg/dL (ref 1.7–2.4)

## 2024-06-12 MED ORDER — SODIUM CHLORIDE 0.9 % IV BOLUS
500.0000 mL | Freq: Once | INTRAVENOUS | Status: AC
  Administered 2024-06-12: 500 mL via INTRAVENOUS

## 2024-06-12 NOTE — Plan of Care (Incomplete)
 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    He was given 300 mL IV fluids by EMS for hypotension.  SBP as low as 60s in the ED but remainder of vital signs stable.  Labs notable for WBC count 2.0, hemoglobin 7.6 (previously 13.4 in February 2024, MCV 98.8, platelet count 119k, potassium 3.0, magnesium  2.4, bicarb 29, calcium  7.6, albumin  3.0, FOBT negative, troponin negative x 2, blood cultures in process, COVID/influenza/RSV PCR pending, lactic acid 1.1.  Anemia panel showing low iron and low TIBC consistent with anemia of chronic disease.  CT chest/abdomen/pelvis without contrast negative for acute findings.  Patient was given 1.5 L normal saline after which blood pressure improved.  EKG: Sinus rhythm, no significant change compared to previous EKGs.

## 2024-06-12 NOTE — ED Triage Notes (Signed)
 Pt. Brought in via [facility] from home; pt. Complains of chest pain and dizziness; pt. Went to dialysis today and started to feel like this after dialysis; He goes to dialysis on M, W, F; Per [facility] the pt's BP have been soft, they gave him some NS (300mls) and kept him in trendelenburg position; Pt. Has a 20G in R forearm, Per [facility] the pt. Ambulated to the truck when they picked him up; the pt. Is not experiencing any SOB or CP at this time.

## 2024-06-12 NOTE — ED Provider Triage Note (Signed)
 Emergency Medicine Provider Triage Evaluation Note  [name] , a 53 y.o. male  was evaluated at bedside  Pt complains of chest pain and hypotension after arriving home from dialysis.  He states that the chest pain occurred in the middle of his chest and lasted several minutes and is resolved since.  He reports that his nephrologist thinks he may be pulling off too much fluid.  He has had hypotension after other dialysis episodes..  Review of Systems  Positive: Hypotension, chest pain Negative: Cough, fever, dyspnea, rash, chills  Physical Exam  BP (!) 81/62 (BP Location: Right Arm)   Pulse 80   Temp (!) 97.3 F (36.3 C) (Oral)   Resp 16   Ht 1.829 m (6')   Wt 67 kg   SpO2 100%   BMI 20.03 kg/m  Gen:   Awake, no distress   Resp:  Normal effort  MSK:   Moves extremities without difficulty  Other:  Hypotension noted on monitor IV fluids infusing  Medical Decision Making  Medically screening exam initiated at 2:37 PM.  Appropriate orders placed.  Hillary Abdou Kadel was informed that the remainder of the evaluation will be completed by another provider, this initial triage assessment does not replace that evaluation, and the importance of remaining in the ED until their evaluation is complete.  Orders to be placed for EKG, CBC, c-Met, troponin, chest x-Smt [place].  Patient is receiving IV fluid bolus that was started prehospital.    Levander Houston, MD 06/12/24 1438

## 2024-06-12 NOTE — ED Provider Notes (Signed)
  EMERGENCY DEPARTMENT AT [facility] Provider Note   CSN: [id number] Arrival date & time: 06/12/24  1418     Patient presents with: Chest Pain   [name] is a 53 y.o. male.   Pt is a 52 yo male with pmhx significant for ESRD on HD (MWF) and htn.  Pt said he's been dealing with low BP for the past few weeks.  His dialysis unit changed his dry weight from 66 kg to 67 kg, but his bp remains low today.  Pt did have some cp during dialysis, but not cp now.  He still feels dizzy.  EMS did given him 300 cc NS en route and bp remains in the 80s.  Pt has not taken his amlodipine  in 4 days.       Prior to Admission medications   Medication Sig Start Date End Date Taking? Authorizing Provider  acetaminophen  (TYLENOL ) 500 MG tablet Take 1,000 mg by mouth daily as needed (for pain or headaches).    [provider]  amLODipine  (NORVASC ) 10 MG tablet Take 10 mg by mouth daily.    [provider]  amLODipine  (NORVASC ) 10 MG tablet Take 1 tablet (10 mg total) by mouth every night as directed. 11/24/23   Collins, Samantha G, PA-C  B Complex-C-Folic Acid  (DIALYVITE TABLET) TABS Take 1 tablet by mouth daily.    [provider]  cloNIDine  (CATAPRES ) 0.1 MG tablet Take 0.1 mg by mouth 2 (two) times daily.    [provider]  sevelamer  carbonate (RENVELA ) 800 MG tablet Take 1 tablet (800 mg total) by mouth 3 (three) times daily with meals. 03/20/23     fluticasone  (FLONASE ) 50 MCG/ACT nasal spray Place 2 sprays into both nostrils daily. Patient not taking: Reported on 06/15/2018 07/25/17 02/20/20  Jarold Olam HERO, PA-C    Allergies: Ivp dye [iodinated contrast media], Lisinopril, and Solu-medrol  [methylprednisolone ]    Review of Systems  Cardiovascular:  Positive for chest pain.  All other systems reviewed and are negative.   Updated Vital Signs BP 91/69   Pulse 72   Temp 97.8 F (36.6 C) (Oral)   Resp 19   Ht 6' (1.829 m)   Wt  67 kg   SpO2 100%   BMI 20.03 kg/m   Physical Exam Vitals and nursing note reviewed.  Constitutional:      Appearance: He is well-developed.  HENT:     Head: Normocephalic and atraumatic.  Eyes:     Extraocular Movements: Extraocular movements intact.     Pupils: Pupils are equal, round, and reactive to light.  Cardiovascular:     Rate and Rhythm: Normal rate and regular rhythm.     Heart sounds: Normal heart sounds.  Pulmonary:     Effort: Pulmonary effort is normal.     Breath sounds: Normal breath sounds.  Abdominal:     General: Bowel sounds are normal.     Palpations: Abdomen is soft.  Musculoskeletal:        General: Normal range of motion.     Cervical back: Normal range of motion and neck supple.  Skin:    General: Skin is warm.     Capillary Refill: Capillary refill takes less than 2 seconds.  Neurological:     General: No focal deficit present.     Mental Status: He is alert and oriented to person, place, and time.  Psychiatric:        Mood and Affect: Mood normal.  Behavior: Behavior normal.     (all labs ordered are listed, but only abnormal results are displayed) Labs Reviewed  CBC - Abnormal; Notable for the following components:      Result Value   WBC 2.0 (*)    RBC 2.44 (*)    Hemoglobin 7.6 (*)    HCT 24.1 (*)    Platelets 119 (*)    All other components within normal limits  COMPREHENSIVE METABOLIC PANEL WITH GFR - Abnormal; Notable for the following components:   Potassium 3.0 (*)    Glucose, Bld 101 (*)    BUN 30 (*)    Creatinine, Ser 11.13 (*)    Calcium  7.6 (*)    Albumin  3.0 (*)    GFR, Estimated 5 (*)    All other components within normal limits  IRON AND TIBC - Abnormal; Notable for the following components:   Iron 42 (*)    TIBC 214 (*)    All other components within normal limits  RETICULOCYTES - Abnormal; Notable for the following components:   RBC. 4.02 (*)    Immature Retic Fract 29.1 (*)    All other components  within normal limits  CULTURE, BLOOD (ROUTINE X 2)  CULTURE, BLOOD (ROUTINE X 2)  RESP PANEL BY RT-PCR (RSV, FLU A&B, COVID)  RVPGX2  PROTIME-INR  MAGNESIUM   VITAMIN B12  FOLATE  FERRITIN  I-STAT CHEM 8, ED  POC OCCULT BLOOD, ED  I-STAT CG4 LACTIC ACID, ED  I-STAT CG4 LACTIC ACID, ED  TROPONIN I (HIGH SENSITIVITY)  TROPONIN I (HIGH SENSITIVITY)  TROPONIN I (HIGH SENSITIVITY)    EKG: EKG Interpretation Date/Time:  Wednesday June 12 2024 15:39:36 EDT Ventricular Rate:  78 PR Interval:  177 QRS Duration:  88 QT Interval:  406 QTC Calculation: 463 R Axis:   78  Text Interpretation: Sinus rhythm Left atrial enlargement Abnrm T, consider ischemia, anterolateral lds Since last tracing rate faster Confirmed by Dean Clarity [phone]) on 06/12/2024 9:15:51 PM  Radiology: CT CHEST ABDOMEN PELVIS WO CONTRAST Result Date: 06/12/2024 CLINICAL DATA:  Sepsis EXAM: CT CHEST, ABDOMEN AND PELVIS WITHOUT CONTRAST TECHNIQUE: Multidetector CT imaging of the chest, abdomen and pelvis was performed following the standard protocol without IV contrast. RADIATION DOSE REDUCTION: This exam was performed according to the departmental dose-optimization program which includes automated exposure control, adjustment of the mA and/or kV according to patient size and/or use of iterative reconstruction technique. COMPARISON:  07/05/2022 FINDINGS: CT CHEST FINDINGS Cardiovascular: Heart is normal size. Aorta is normal caliber. Coronary artery and aortic atherosclerosis. Right dialysis catheter tip in the SVC. Mediastinum/Nodes: No mediastinal, hilar, or axillary adenopathy. Trachea and esophagus are unremarkable. Thyroid unremarkable. Lungs/Pleura: Lungs are clear. No focal airspace opacities or suspicious nodules. No effusions. Musculoskeletal: Chest wall soft tissues are unremarkable. No acute bony abnormality. CT ABDOMEN PELVIS FINDINGS Hepatobiliary: No focal hepatic abnormality. Gallbladder unremarkable.  Pancreas: No focal abnormality or ductal dilatation. Spleen: No focal abnormality.  Normal size. Adrenals/Urinary Tract: Adrenal glands normal. Numerous cysts throughout the kidneys compatible with polycystic kidney disease. No hydronephrosis. Urinary bladder decompressed. Stomach/Bowel: Normal appendix. Right colonic diverticulosis. No active diverticulitis. Stomach and small bowel decompressed. No obstruction or inflammatory process. Vascular/Lymphatic: Scattered aortic atherosclerosis. No evidence of aneurysm or adenopathy. Right iliac venous stent in place. Bilateral upper thigh dialysis grafts partially visualized. Again noted in the left groin is a 3.7 x 3.4 cm mass adjacent to the vessels and graft, unchanged since prior study. 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 Reproductive: No visible focal abnormality. Other: No free fluid or free air. Musculoskeletal: No acute bony abnormality. IMPRESSION: No acute findings in the chest, abdomen or pelvis. Evidence of polycystic kidney disease.  No hydronephrosis. Right colonic diverticulosis.  No active diverticulitis. Coronary artery disease, aortic atherosclerosis. Electronically Signed   By: Franky Crease M.D.   On: 06/12/2024 21:04   DG Chest Port 1 View Result Date: 06/12/2024 CLINICAL DATA:  Chest pain EXAM: PORTABLE CHEST 1 VIEW COMPARISON:  November 10, 2022 FINDINGS: Unchanged right IJ approach central venous catheter. No focal consolidation. No pleural effusions. No pneumothorax. Unchanged cardiomediastinal silhouette. No acute osseous findings. IMPRESSION: No active disease. Electronically Signed   By: Michaeline Blanch M.D.   On: 06/12/2024 15:07     Procedures   Medications Ordered in the ED  sodium chloride  0.9 % bolus 500 mL (0 mLs Intravenous Stopped 06/12/24 1619)  sodium chloride  0.9 % bolus 500 mL (0 mLs Intravenous Stopped 06/12/24 1702)  sodium chloride  0.9 % bolus 500 mL (0 mLs Intravenous Stopped 06/12/24 2019)                                     Medical Decision Making Amount and/or Complexity of Data Reviewed Labs: ordered. Radiology: ordered.  Risk Decision regarding hospitalization.   This patient presents to the ED for concern of hypotension, this involves an extensive number of treatment options, and is a complaint that carries with it a high risk of complications and morbidity.  The differential diagnosis includes sepsis, cardiac event, anemia, dialysis issue   Co morbidities that complicate the patient evaluation  ESRD and HTN   Additional history obtained:  Additional history obtained from epic chart review External records from outside source obtained and reviewed including EMS report   Lab Tests:  I Ordered, and personally interpreted labs.  The pertinent results include:  cbc with wbc low at 2.0, hgb low at 7.6, plt low at 119 (hgb 13.4 in feb 2024); fobt neg   Imaging Studies ordered:  I ordered imaging studies including cxr and ct I independently visualized and interpreted imaging which showed  CXR: No active disease.  CT chest/abd/pelvis: No acute findings in the chest, abdomen or pelvis.    Evidence of polycystic kidney disease.  No hydronephrosis.    Right colonic diverticulosis.  No active diverticulitis.    Coronary artery disease, aortic atherosclerosis.   I agree with the radiologist interpretation   Cardiac Monitoring:  The patient was maintained on a cardiac monitor.  I personally viewed and interpreted the cardiac monitored which showed an underlying rhythm of: nsr   Medicines ordered and prescription drug management:  I ordered medication including ivfs  for sx  Reevaluation of the patient after these medicines showed that the patient improved I have reviewed the patients home medicines and have made adjustments as needed   Test Considered:  ct   Critical Interventions:  ivfs   Consultations Obtained:  I requested  consultation with the hospitalist (Dr. Alfornia),  and discussed lab and imaging findings as well as pertinent plan - she will admit   Problem List / ED Course:  Hypotension:  gradually improving after gentle fluids.  I suspect hypotension due to too much fluid being removed during dialysis.  He also may need some midodrine .  It could also be due to the anemia. Anemia:  new since Feb, 2024.  Pt said he has monthly labs and they have never told him he was anemic.  He will need further eval of his anemia.   Reevaluation:  After the interventions noted above, I reevaluated the patient and found that they have :improved   Social Determinants of Health:  Lives at home   Dispostion:  After consideration of the diagnostic results and the patients response to treatment, I feel that the patent would benefit from admission.  CRITICAL CARE Performed by: Mliss Boyers   Total critical care time: 30 minutes  Critical care time was exclusive of separately billable procedures and treating other patients.  Critical care was necessary to treat or prevent imminent or life-threatening deterioration.  Critical care was time spent personally by me on the following activities: development of treatment plan with patient and/or surrogate as well as nursing, discussions with consultants, evaluation of patient's response to treatment, examination of patient, obtaining history from patient or surrogate, ordering and performing treatments and interventions, ordering and review of laboratory studies, ordering and review of radiographic studies, pulse oximetry and re-evaluation of patient's condition.        Final diagnoses:  Hypotension, unspecified hypotension type  Anemia, unspecified type  ESRD on hemodialysis [facility])    ED Discharge Orders     None          Boyers Mliss, MD 06/12/24 2231

## 2024-06-12 NOTE — ED Notes (Signed)
 Food/drink given to pt per MD

## 2024-06-13 ENCOUNTER — Observation Stay (HOSPITAL_COMMUNITY): Payer: Self-pay

## 2024-06-13 DIAGNOSIS — I953 Hypotension of hemodialysis: Secondary | ICD-10-CM

## 2024-06-13 DIAGNOSIS — R9431 Abnormal electrocardiogram [ECG] [EKG]: Secondary | ICD-10-CM

## 2024-06-13 DIAGNOSIS — R079 Chest pain, unspecified: Secondary | ICD-10-CM

## 2024-06-13 DIAGNOSIS — D696 Thrombocytopenia, unspecified: Secondary | ICD-10-CM

## 2024-06-13 LAB — ECHOCARDIOGRAM COMPLETE
AR max vel: 3.36 cm2
AV Area VTI: 3.89 cm2
AV Area mean vel: 3.71 cm2
AV Mean grad: 2 mmHg
AV Peak grad: 4.1 mmHg
Ao pk vel: 1.01 m/s
Area-P 1/2: 3.95 cm2
Height: 72 in
S' Lateral: 2.9 cm
Single Plane A4C EF: 63 %
Weight: 2363.33 [oz_av]

## 2024-06-13 LAB — BASIC METABOLIC PANEL WITH GFR
Anion gap: 16 — ABNORMAL HIGH (ref 5–15)
BUN: 38 mg/dL — ABNORMAL HIGH (ref 6–20)
CO2: 22 mmol/L (ref 22–32)
Calcium: 7.5 mg/dL — ABNORMAL LOW (ref 8.9–10.3)
Chloride: 101 mmol/L (ref 98–111)
Creatinine, Ser: 13.14 mg/dL — ABNORMAL HIGH (ref 0.61–1.24)
GFR, Estimated: 4 mL/min — ABNORMAL LOW (ref >60)
Glucose, Bld: 77 mg/dL (ref 70–99)
Potassium: 4.5 mmol/L (ref 3.5–5.1)
Sodium: 139 mmol/L (ref 135–145)

## 2024-06-13 LAB — CBC
HCT: 43.5 % (ref 39.0–52.0)
Hemoglobin: 13.6 g/dL (ref 13.0–17.0)
MCH: 31 pg (ref 26.0–34.0)
MCHC: 31.3 g/dL (ref 30.0–36.0)
MCV: 99.1 fL (ref 80.0–100.0)
Platelets: 207 K/uL (ref 150–400)
RBC: 4.39 MIL/uL (ref 4.22–5.81)
RDW: 13.8 % (ref 11.5–15.5)
WBC: 4.8 K/uL (ref 4.0–10.5)
nRBC: 0 % (ref 0.0–0.2)

## 2024-06-13 LAB — HIV ANTIBODY (ROUTINE TESTING W REFLEX): HIV Screen 4th Generation wRfx: NONREACTIVE

## 2024-06-13 LAB — TROPONIN I (HIGH SENSITIVITY): Troponin I (High Sensitivity): 14 ng/L (ref <18)

## 2024-06-13 MED ORDER — RENA-VITE PO TABS
1.0000 | ORAL_TABLET | Freq: Every day | ORAL | Status: DC
  Filled 2024-06-13: qty 1

## 2024-06-13 MED ORDER — MIDODRINE HCL 5 MG PO TABS: 5.0000 mg | ORAL_TABLET | ORAL | Status: DC

## 2024-06-13 MED ORDER — MIDODRINE HCL 5 MG PO TABS
5.0000 mg | ORAL_TABLET | Freq: Every day | ORAL | Status: DC
  Administered 2024-06-13: 5 mg via ORAL
  Filled 2024-06-13: qty 1

## 2024-06-13 MED ORDER — SEVELAMER CARBONATE 800 MG PO TABS
2400.0000 mg | ORAL_TABLET | Freq: Three times a day (TID) | ORAL | Status: DC
  Filled 2024-06-13: qty 3

## 2024-06-13 MED ORDER — CHLORHEXIDINE GLUCONATE CLOTH 2 % EX PADS
6.0000 | MEDICATED_PAD | Freq: Every day | CUTANEOUS | Status: DC
  Administered 2024-06-14: 6 via TOPICAL

## 2024-06-13 MED ORDER — ACETAMINOPHEN 650 MG RE SUPP: 650.0000 mg | Freq: Four times a day (QID) | RECTAL | Status: DC | PRN

## 2024-06-13 MED ORDER — ACETAMINOPHEN 325 MG PO TABS
650.0000 mg | ORAL_TABLET | Freq: Four times a day (QID) | ORAL | Status: DC | PRN
  Administered 2024-06-14: 650 mg via ORAL

## 2024-06-13 NOTE — ED Notes (Signed)
 Attempted to call charge RN on 5W to notify of patient coming to unit. No answer. Pt en route.

## 2024-06-13 NOTE — Consult Note (Signed)
 [facility] Renal Consultation Note    Indication for Consultation:  Management of ESRD/hemodialysis; anemia, hypertension/volume and secondary hyperparathyroidism  ERE:Zitjmid, Rosaline SQUIBB, NP  HPI: [name] is a 53 y.o. male with ESRD on HD MWF at [facility]. He has a past medical history significant for Hep B (+), HTN, CHF, mild MR, chronic anemia, hypothyroidism, and anxiety/depression.  Patient presented to the ED today with complaints for ongoing hypotension.  Seen and examined patient at bedside in the ED.  He informs me that his blood pressures have been low for the past few weeks lately during outpatient dialysis.  Reviewed his outpatient hemodialysis records and noted both amlodipine  and losartan were stopped 2 weeks ago by us .  He was also seen by renal PA yesterday at his outpatient HD center and noted his EDW was raised by 1 kg.  He reports despite his EDW being raised, he continues to have low blood pressures at home prompting him to come into the hospital today.  He is currently not on any blood pressure medications.  Currently denies shortness of breath, CP, and N/V.  Discussed with hospitalist and plan to start midodrine  daily and okay to give an extra dose with HD if needed.  Plan for dialysis tomorrow morning per his routine schedule.  If he tolerates both dialysis and midodrine , I am hopeful he will be able to be discharged.  Current CXR and CT ABD/pelvis are negative.  Renal labs are also stable.  Past Medical History:  Diagnosis Date   Anemia    Anxiety    Depression    ESRD on hemodialysis (HCC)    HD [facility] MWF   Hypertension    Thyroid disease    Past Surgical History:  Procedure Laterality Date   ARTERIOVENOUS GRAFT PLACEMENT Left    forearm, it's not working; thigh    ARTERIOVENOUS GRAFT PLACEMENT Left 11/09/2015   AV FISTULA PLACEMENT Right 01/10/2017   Procedure: INSERTION OF ARTERIOVENOUS Right thigh GORE-TEX  GRAFT;  Surgeon: Carlin FORBES Haddock, MD;  Location: [facility] OR;  Service: Vascular;  Laterality: Right;   FALSE ANEURYSM REPAIR Left 11/09/2015   Procedure: REPAIR OF LEFT FEMORAL PSEUDOANEURYSM; REVISION  OF LEFT THIGH ARTERIOVENOUS GRAFT USING X 10 CM GORETEX GRAFT ;  Surgeon: Krystal JULIANNA Doing, MD;  Location: [facility] OR;  Service: Vascular;  Laterality: Left;   IR AV DIALY SHUNT INTRO NEEDLE/INTRACATH INITIAL W/PTA/IMG RIGHT Right 02/06/2019   IR AV DIALY SHUNT INTRO NEEDLE/INTRACATH INITIAL W/PTA/IMG RIGHT Right 05/14/2019   IR AV DIALY SHUNT INTRO NEEDLE/INTRACATH INITIAL W/PTA/IMG RIGHT Right 08/18/2020   IR AV DIALY SHUNT INTRO NEEDLE/INTRACATH INITIAL W/PTA/IMG RIGHT Right 04/21/2022   IR DIALY SHUNT INTRO NEEDLE/INTRACATH INITIAL W/IMG RIGHT Right 09/17/2019   IR DIALY SHUNT INTRO NEEDLE/INTRACATH INITIAL W/IMG RIGHT Right 12/10/2019   IR FLUORO GUIDE CV LINE RIGHT  07/10/2021   IR FLUORO GUIDE CV LINE RIGHT  06/27/2022   IR FLUORO GUIDE CV LINE RIGHT  08/06/2022   IR GENERIC HISTORICAL  07/15/2016   IR US  GUIDE VASC ACCESS LEFT 07/15/2016 Norleen Roulette, MD [facility]   IR GENERIC HISTORICAL Left 07/15/2016   IR THROMBECTOMY AV FISTULA W/THROMBOLYSIS/PTA INC/SHUNT/IMG LEFT 07/15/2016 Norleen Roulette, MD [facility]   IR GENERIC HISTORICAL  10/05/2016   IR US  GUIDE VASC ACCESS LEFT 10/05/2016 Ozell Specking, MD [facility]   IR GENERIC HISTORICAL Left 10/05/2016   IR THROMBECTOMY AV FISTULA W/THROMBOLYSIS/PTA INC/SHUNT/IMG LEFT 10/05/2016 Ozell Specking, MD [facility]   IR GENERIC HISTORICAL  10/08/2016   IR FLUORO GUIDE CV LINE RIGHT 10/08/2016 Ozell Specking, MD [facility]   IR GENERIC HISTORICAL  10/08/2016   IR US  GUIDE VASC ACCESS RIGHT 10/08/2016 Ozell Specking, MD [facility]   IR PTA AND STENT ADDL CENTRAL DIALY SEG THRU DIALY CIRCUIT RIGHT Right 12/10/2019   IR RADIOLOGIST EVAL & MGMT  11/26/2019   IR REMOVAL TUN CV CATH W/O FL  03/16/2017   IR REMOVAL TUN CV CATH W/O FL  07/22/2021   IR THROMBECTOMY AV FISTULA  W/THROMBOLYSIS/PTA INC/SHUNT/IMG RIGHT Right 06/16/2018   IR THROMBECTOMY AV FISTULA W/THROMBOLYSIS/PTA INC/SHUNT/IMG RIGHT Right 06/17/2018   IR THROMBECTOMY AV FISTULA W/THROMBOLYSIS/PTA INC/SHUNT/IMG RIGHT Right 07/15/2021   IR THROMBECTOMY AV FISTULA W/THROMBOLYSIS/PTA INC/SHUNT/IMG RIGHT Right 06/29/2022   IR THROMBECTOMY AV FISTULA W/THROMBOLYSIS/PTA INC/SHUNT/IMG RIGHT Right 07/05/2022   IR THROMBECTOMY AV FISTULA W/THROMBOLYSIS/PTA INC/SHUNT/IMG RIGHT Right 08/02/2022   IR US  GUIDE VASC ACCESS RIGHT  06/17/2018   IR US  GUIDE VASC ACCESS RIGHT  06/16/2018   IR US  GUIDE VASC ACCESS RIGHT  02/06/2019   IR US  GUIDE VASC ACCESS RIGHT  05/14/2019   IR US  GUIDE VASC ACCESS RIGHT  09/17/2019   IR US  GUIDE VASC ACCESS RIGHT  12/10/2019   IR US  GUIDE VASC ACCESS RIGHT  08/18/2020   IR US  GUIDE VASC ACCESS RIGHT  07/10/2021   IR US  GUIDE VASC ACCESS RIGHT  07/19/2021   IR US  GUIDE VASC ACCESS RIGHT  04/21/2022   IR US  GUIDE VASC ACCESS RIGHT  06/27/2022   IR US  GUIDE VASC ACCESS RIGHT  06/29/2022   IR US  GUIDE VASC ACCESS RIGHT  07/05/2022   IR US  GUIDE VASC ACCESS RIGHT  08/02/2022   IR US  GUIDE VASC ACCESS RIGHT  08/06/2022   IVC VENOGRAPHY N/A 10/27/2022   Procedure: IVC Venography;  Surgeon: Gretta Lonni PARAS, MD;  [facility]: [facility] INVASIVE CV LAB;  Service: Cardiovascular;  Laterality: N/A;   PARATHYROIDECTOMY N/A 04/24/2014   Procedure: TOTAL 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;  Surgeon: Krystal CHRISTELLA Spinner, MD;  Location: [facility] OR;  Service: General;  Laterality: N/A;  NECK AND LEFT FOREARM   PERIPHERAL VASCULAR CATHETERIZATION N/A 05/05/2016   Procedure: A/V Shuntogram;  Surgeon: Redell LITTIE Door, MD;  Location: [facility] INVASIVE CV LAB;  Service: Cardiovascular;  Laterality: N/A;   PERIPHERAL VASCULAR CATHETERIZATION Left 05/05/2016   Procedure: Peripheral Vascular Balloon Angioplasty;  Surgeon: Redell LITTIE Door, MD;  Location: [facility] INVASIVE CV LAB;  Service: Cardiovascular;  Laterality: Left;   PSEUDOANEURYSM  REPAIR Left 11/09/2015   TEE WITHOUT CARDIOVERSION N/A 02/19/2021   Procedure: TRANSESOPHAGEAL ECHOCARDIOGRAM (TEE);  Surgeon: Mona Vinie BROCKS, MD;  Location: [facility] ENDOSCOPY;  Service: Cardiovascular;  Laterality: N/A;   THROMBECTOMY / ARTERIOVENOUS GRAFT REVISION Left 08/18/2015   thigh   THROMBECTOMY AND REVISION OF ARTERIOVENTOUS (AV) GORETEX  GRAFT Left 08/23/2015   Procedure: THROMBECTOMY AND REVISION OF ARTERIOVENTOUS (AV) GORETEX  GRAFT LEFT THIGH ;  Surgeon: Lonni GORMAN Blade, MD;  Location: [facility] OR;  Service: Vascular;  Laterality: Left;   THROMBECTOMY W/ EMBOLECTOMY Left 08/18/2015   Procedure: THROMBECTOMY  AND REVISION ARTERIOVENOUS GORE-TEX GRAFT/LEFT THIGH;  Surgeon: Gaile LELON New, MD;  Location: [facility] OR;  Service: Vascular;  Laterality: Left;   THROMBECTOMY W/ EMBOLECTOMY Right 06/19/2018   Procedure: THROMBECTOMY AND REVISION OF RIGHT THIGH  ARTERIOVENOUS GORE-TEX GRAFT;  Surgeon: Blade Lonni GORMAN, MD;  Location: [facility] OR;  Service: Vascular;  Laterality: Right;   UPPER EXTREMITY VENOGRAPHY  Bilateral 12/27/2016   Procedure: Upper Extremity Venography;  Surgeon: Gaile LELON New, MD;  Location: [facility] INVASIVE CV LAB;  Service: Cardiovascular;  Laterality: Bilateral;   VENOGRAM Left 05/05/2016   Procedure: Venogram;  Surgeon: Redell LITTIE Door, MD;  Location: [facility] INVASIVE CV LAB;  Service: Cardiovascular;  Laterality: Left;  lower extremity   Family History  Problem Relation Age of Onset   Renal Disease Neg Hx    Social History:  reports that he quit smoking about 38 years ago. His smoking use included cigarettes. He has never used smokeless tobacco. He reports that he does not drink alcohol and does not use drugs. Allergies  Allergen Reactions   Ivp Dye [Iodinated Contrast Media] Swelling    SWELLING REACTION UNSPECIFIED    Lisinopril Cough   Solu-Medrol  [Methylprednisolone ] Nausea And Vomiting   Prior to Admission medications   Medication Sig Start Date End Date Taking? Authorizing  Provider  acetaminophen  (TYLENOL ) 500 MG tablet Take 1,000 mg by mouth daily as needed (for pain or headaches).   Yes [provider]  B Complex-C-Folic Acid  (DIALYVITE TABLET) TABS Take 1 tablet by mouth daily.   Yes [provider]  sevelamer  carbonate (RENVELA ) 800 MG tablet Take 1 tablet (800 mg total) by mouth 3 (three) times daily with meals. Patient taking differently: Take 2,400 mg by mouth 3 (three) times daily with meals. 03/20/23  Yes   amLODipine  (NORVASC ) 10 MG tablet Take 1 tablet (10 mg total) by mouth every night as directed. Patient not taking: Reported on 06/13/2024 11/24/23   Gerome Lucie MATSU, PA-C  cloNIDine  (CATAPRES ) 0.1 MG tablet Take 0.1 mg by mouth 2 (two) times daily. Patient not taking: Reported on 06/13/2024    [provider]  fluticasone  (FLONASE ) 50 MCG/ACT nasal spray Place 2 sprays into both nostrils daily. Patient not taking: Reported on 06/15/2018 07/25/17 02/20/20  Jarold Olam HERO, PA-C   Current Facility-Administered Medications  Medication Dose Route Frequency Provider Last Rate Last Admin   acetaminophen  (TYLENOL ) tablet 650 mg  650 mg Oral Q6H PRN Alfornia Madison, MD       Or   acetaminophen  (TYLENOL ) suppository 650 mg  650 mg Rectal Q6H PRN Alfornia Madison, MD       midodrine  (PROAMATINE ) tablet 5 mg  5 mg Oral Daily Coreon Simkins E, NP       [START ON 06/14/2024] midodrine  (PROAMATINE ) tablet 5 mg  5 mg Oral Q M,W,F-HD Camree Wigington E, NP       multivitamin (RENA-VIT) tablet 1 tablet  1 tablet Oral Daily Rathore, Vasundhra, MD       sevelamer  carbonate (RENVELA ) tablet 2,400 mg  2,400 mg Oral TID WC Rathore, Vasundhra, MD       Current Outpatient Medications  Medication Sig Dispense Refill   acetaminophen  (TYLENOL ) 500 MG tablet Take 1,000 mg by mouth daily as needed (for pain or headaches).     B Complex-C-Folic Acid  (DIALYVITE TABLET) TABS Take 1 tablet by mouth daily.     sevelamer  carbonate (RENVELA ) 800  MG tablet Take 1 tablet (800 mg total) by mouth 3 (three) times daily with meals. (Patient taking differently: Take 2,400 mg by mouth 3 (three) times daily with meals.) 270 tablet 3   amLODipine  (NORVASC ) 10 MG tablet Take 1 tablet (10 mg total) by mouth every night as directed. (Patient not taking: Reported on 06/13/2024) 90 tablet 3   cloNIDine  (CATAPRES ) 0.1 MG tablet Take 0.1 mg by mouth 2 (two) times daily. (Patient  not taking: Reported on 06/13/2024)     Facility-Administered Medications Ordered in Other Encounters  Medication Dose Route Frequency Provider Last Rate Last Admin   0.9 %  sodium chloride  infusion   Intravenous Continuous Christine Males, PA-C   New Bag at 02/19/21 1347   Labs: Basic Metabolic Panel: Recent Labs  Lab 06/12/24 1638 06/13/24 0603  NA 139 139  K 3.0* 4.5  CL 99 101  CO2 29 22  GLUCOSE 101* 77  BUN 30* 38*  CREATININE 11.13* 13.14*  CALCIUM  7.6* 7.5*   Liver Function Tests: Recent Labs  Lab 06/12/24 1638  AST 17  ALT 10  ALKPHOS 61  BILITOT 0.5  PROT 6.8  ALBUMIN  3.0*   No results for input(s): LIPASE, AMYLASE in the last 168 hours. No results for input(s): AMMONIA in the last 168 hours. CBC: Recent Labs  Lab 06/12/24 1451 06/13/24 0811  WBC 2.0* 4.8  HGB 7.6* 13.6  HCT 24.1* 43.5  MCV 98.8 99.1  PLT 119* 207   Cardiac Enzymes: No results for input(s): CKTOTAL, CKMB, CKMBINDEX, TROPONINI in the last 168 hours. CBG: No results for input(s): GLUCAP in the last 168 hours. Iron Studies:  Recent Labs    06/12/24 2004  IRON 42*  TIBC 214*  FERRITIN 253   Studies/Results: ECHOCARDIOGRAM COMPLETE Result Date: 06/13/2024    ECHOCARDIOGRAM REPORT   Patient Name:   [name] Date of Exam: 06/13/2024 Medical Rec #:  [id number]             Height:       72.0 in Accession #:    [id number]            Weight:       147.7 lb Date of Birth:  1971-01-10             BSA:          1.873 m Patient Age:    52 years               BP:           105/83 mmHg Patient Gender: M                     HR:           55 bpm. Exam Location:  Inpatient Procedure: 2D Echo (Both Spectral and Color Flow Doppler were utilized during            procedure). Indications:    Abnormal ECG  History:        Patient has prior history of Echocardiogram examinations.                 Signs/Symptoms:Chest Pain; Risk Factors:Hypertension.  Sonographer:    Charmaine Gaskins Referring Phys: [id number] BINAYA DAHAL IMPRESSIONS  1. Left ventricular ejection fraction, by estimation, is 60 to 65%. The left ventricle has normal function. The left ventricle has no regional wall motion abnormalities. Left ventricular diastolic parameters were normal.  2. Right ventricular systolic function is normal. The right ventricular size is normal.  3. The mitral valve is normal in structure. Trivial mitral valve regurgitation. No evidence of mitral stenosis.  4. The aortic valve is normal in structure. Aortic valve regurgitation is not visualized. Aortic valve sclerosis is present, with no evidence of aortic valve stenosis.  5. The inferior vena cava is normal in size with greater than 50% respiratory variability, suggesting right atrial pressure of 3 mmHg. FINDINGS  Left Ventricle:  Left ventricular ejection fraction, by estimation, is 60 to 65%. The left ventricle has normal function. The left ventricle has no regional wall motion abnormalities. The left ventricular internal cavity size was normal in size. There is  no left ventricular hypertrophy. Left ventricular diastolic parameters were normal. Right Ventricle: The right ventricular size is normal. No increase in right ventricular wall thickness. Right ventricular systolic function is normal. Left Atrium: Left atrial size was normal in size. Right Atrium: Right atrial size was normal in size. Pericardium: There is no evidence of pericardial effusion. Presence of epicardial fat layer. Mitral Valve: The mitral valve is normal in structure.  Trivial mitral valve regurgitation. No evidence of mitral valve stenosis. Tricuspid Valve: The tricuspid valve is normal in structure. Tricuspid valve regurgitation is mild . No evidence of tricuspid stenosis. Aortic Valve: The aortic valve is normal in structure. Aortic valve regurgitation is not visualized. Aortic valve sclerosis is present, with no evidence of aortic valve stenosis. Aortic valve mean gradient measures 2.0 mmHg. Aortic valve peak gradient measures 4.1 mmHg. Aortic valve area, by VTI measures 3.89 cm. Pulmonic Valve: The pulmonic valve was normal in structure. Pulmonic valve regurgitation is not visualized. No evidence of pulmonic stenosis. Aorta: The aortic root is normal in size and structure. Venous: The inferior vena cava is normal in size with greater than 50% respiratory variability, suggesting right atrial pressure of 3 mmHg. IAS/Shunts: No atrial level shunt detected by color flow Doppler.  LEFT VENTRICLE PLAX 2D LVIDd:         4.40 cm     Diastology LVIDs:         2.90 cm     LV e' medial:    9.48 cm/s LV PW:         0.80 cm     LV E/e' medial:  9.4 LV IVS:        0.90 cm     LV e' lateral:   10.40 cm/s LVOT diam:     2.20 cm     LV E/e' lateral: 8.5 LV SV:         79 LV SV Index:   42 LVOT Area:     3.80 cm  LV Volumes (MOD) LV vol d, MOD A4C: 87.2 ml LV vol s, MOD A4C: 32.3 ml LV SV MOD A4C:     87.2 ml RIGHT VENTRICLE RV Basal diam:  2.70 cm RV Mid diam:    2.60 cm RV S prime:     11.60 cm/s LEFT ATRIUM             Index        RIGHT ATRIUM           Index LA diam:        2.90 cm 1.55 cm/m   RA Area:     11.50 cm LA Vol (A2C):   25.8 ml 13.77 ml/m  RA Volume:   25.90 ml  13.83 ml/m LA Vol (A4C):   23.5 ml 12.55 ml/m LA Biplane Vol: 26.0 ml 13.88 ml/m  AORTIC VALVE AV Area (Vmax):    3.36 cm AV Area (Vmean):   3.71 cm AV Area (VTI):     3.89 cm AV Vmax:           101.00 cm/s AV Vmean:          66.200 cm/s AV VTI:            0.204 m AV Peak Grad:  4.1 mmHg AV Mean Grad:       2.0 mmHg LVOT Vmax:         89.40 cm/s LVOT Vmean:        64.600 cm/s LVOT VTI:          0.209 m LVOT/AV VTI ratio: 1.02  AORTA Ao Root diam: 3.10 cm Ao Asc diam:  3.90 cm MITRAL VALVE               TRICUSPID VALVE MV Area (PHT): 3.95 cm    TR Peak grad:   24.8 mmHg MV Decel Time: 192 msec    TR Vmax:        249.00 cm/s MV E velocity: 88.80 cm/s MV A velocity: 83.90 cm/s  SHUNTS MV E/A ratio:  1.06        Systemic VTI:  0.21 m                            Systemic Diam: 2.20 cm Kardie Tobb DO Electronically signed by Dub Huntsman DO Signature Date/Time: 06/13/2024/2:54:34 PM    Final    CT CHEST ABDOMEN PELVIS WO CONTRAST Result Date: 06/12/2024 CLINICAL DATA:  Sepsis EXAM: CT CHEST, ABDOMEN AND PELVIS WITHOUT CONTRAST TECHNIQUE: Multidetector CT imaging of the chest, abdomen and pelvis was performed following the standard protocol without IV contrast. RADIATION DOSE REDUCTION: This exam was performed according to the departmental dose-optimization program which includes automated exposure control, adjustment of the mA and/or kV according to patient size and/or use of iterative reconstruction technique. COMPARISON:  07/05/2022 FINDINGS: CT CHEST FINDINGS Cardiovascular: Heart is normal size. Aorta is normal caliber. Coronary artery and aortic atherosclerosis. Right dialysis catheter tip in the SVC. Mediastinum/Nodes: No mediastinal, hilar, or axillary adenopathy. Trachea and esophagus are unremarkable. Thyroid unremarkable. Lungs/Pleura: Lungs are clear. No focal airspace opacities or suspicious nodules. No effusions. Musculoskeletal: Chest wall soft tissues are unremarkable. No acute bony abnormality. CT ABDOMEN PELVIS FINDINGS Hepatobiliary: No focal hepatic abnormality. Gallbladder unremarkable. Pancreas: No focal abnormality or ductal dilatation. Spleen: No focal abnormality.  Normal size. Adrenals/Urinary Tract: Adrenal glands normal. Numerous cysts throughout the kidneys compatible with polycystic kidney  disease. No hydronephrosis. Urinary bladder decompressed. Stomach/Bowel: Normal appendix. Right colonic diverticulosis. No active diverticulitis. Stomach and small bowel decompressed. No obstruction or inflammatory process. Vascular/Lymphatic: Scattered aortic atherosclerosis. No evidence of aneurysm or adenopathy. Right iliac venous stent in place. Bilateral upper thigh dialysis grafts partially visualized. Again noted in the left groin is a 3.7 x 3.4 cm mass adjacent to the vessels and graft, unchanged since prior study. 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 Reproductive: No visible focal abnormality. Other: No free fluid or free air. Musculoskeletal: No acute bony abnormality. IMPRESSION: No acute findings in the chest, abdomen or pelvis. Evidence of polycystic kidney disease.  No hydronephrosis. Right colonic diverticulosis.  No active diverticulitis. Coronary artery disease, aortic atherosclerosis. Electronically Signed   By: Franky Crease M.D.   On: 06/12/2024 21:04   DG Chest Port 1 View Result Date: 06/12/2024 CLINICAL DATA:  Chest pain EXAM: PORTABLE CHEST 1 VIEW COMPARISON:  November 10, 2022 FINDINGS: Unchanged right IJ approach central venous catheter. No focal consolidation. No pleural effusions. No pneumothorax. Unchanged cardiomediastinal silhouette. No acute osseous findings. IMPRESSION: No active disease. Electronically Signed   By: Michaeline Blanch M.D.   On: 06/12/2024 15:07    ROS: All others negative except those listed in HPI.  Physical Exam: Vitals:   06/13/24 1100 06/13/24 1145 06/13/24 1400 06/13/24 1445  BP: 105/83 104/79 111/81 106/80  Pulse: (!) 56 (!) 57 (!) 59 (!) 56  Resp: 16 (!) 22 (!) 21 20  Temp:      TempSrc:      SpO2: 97% 100% 95% 100%  Weight:      Height:         General: Awake, alert, on RA, NAD Head: Sclera not icteric Lungs: CTA bilaterally. No wheeze, rales or rhonchi. Breathing is unlabored. Heart: RRR. No murmur, rubs or  gallops.  Abdomen: soft and non-tender Lower extremities: no LE edema Neuro: AAOx3. Moves all extremities spontaneously. Dialysis Access: [facility]  Dialysis Orders:  MWF - [facility] 4hrs, BFR 300, DFR Auto 1.5,  EDW 67.4kg, 2K/ 3Ca Heparin  2500 unit bolus with HD  Last Labs: Hgb 13.6, K 4.5, Ca 7.5  Assessment/Plan: Hypotension - Home BP meds stopped 2 weeks ago by us . EDW also raised yesterday. He reports ongoing low Bps despite recent interventions. Will start Midodrine  5mg  daily for now. Titrate dose if needed. Okay to give an extra 5mg  with HD mid-run PRN. ESRD -  on HD MWF. Plan for HD tomorrow per his usual schedule. Please note patient is Hep B (+). He will be on isolation on the HD unit. Hypertension/volume  - Euvolemic on exam. See above. Get standing weights Anemia of CKD - Fe/ESA is not indicated at this time Secondary Hyperparathyroidism -  Checking phos in AM Nutrition - Renal diet with fluid restriction Dispo - Inpatient  Charmaine Piety, NP [facility] 06/13/2024, 3:56 PM

## 2024-06-13 NOTE — H&P (Signed)
 History and Physical    Linus Weckerly Gauer FMW:[id number] DOB: [birth year] DOA: 06/12/2024  PCP: Celestia Rosaline SQUIBB, NP  Patient coming from: Home  Chief Complaint: Chest pain  HPI: [name] is a 53 y.o. 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  Patient states after dialysis yesterday he started having substernal chest pain and dizziness.  Symptoms have now resolved.  He thinks they might have pulled too much fluid during dialysis.  States his nephrologist recently told him to stop taking his home antihypertensives including amlodipine  and losartan which he has not taken for the past 4 days.  Denies fevers, cough, shortness of breath, vomiting, abdominal pain, or diarrhea.  Denies hematemesis, hematochezia, melena, or any other bleeding.  ED Course: He was given 300 mL IV fluids by EMS for hypotension.  SBP as low as 60s in the ED but remainder of vital signs stable.  Labs notable for WBC count 2.0, hemoglobin 7.6 (previously 13.4 in February 2024), MCV 98.8, platelet count 119k, potassium 3.0, magnesium  2.4, bicarb 29, calcium  7.6, albumin  3.0, FOBT negative, troponin negative x 2, blood cultures in process, COVID/influenza/RSV PCR pending, lactic acid 1.1.  Anemia panel showing low iron and low TIBC consistent with anemia of chronic disease.  CT chest/abdomen/pelvis without contrast negative for acute findings.  Patient was given 1.5 L normal saline after which blood pressure improved.   Review of Systems:  Review of Systems  All other systems reviewed and are negative.   Past Medical History:  Diagnosis Date   Anemia    Anxiety    Depression    ESRD on hemodialysis (HCC)    HD [facility] [place] MWF   Hypertension    Thyroid disease     Past Surgical  History:  Procedure Laterality Date   ARTERIOVENOUS GRAFT PLACEMENT Left    forearm, it's not working; thigh    ARTERIOVENOUS GRAFT PLACEMENT Left 11/09/2015   AV FISTULA PLACEMENT Right 01/10/2017   Procedure: INSERTION OF ARTERIOVENOUS Right thigh GORE-TEX GRAFT;  Surgeon: Carlin FORBES Haddock, MD;  Location: [facility] OR;  Service: Vascular;  Laterality: Right;   FALSE ANEURYSM REPAIR Left 11/09/2015   Procedure: REPAIR OF LEFT FEMORAL PSEUDOANEURYSM; REVISION  OF LEFT THIGH ARTERIOVENOUS GRAFT USING X 10 CM GORETEX GRAFT ;  Surgeon: Krystal JULIANNA Doing, MD;  Location: [facility] OR;  Service: Vascular;  Laterality: Left;   IR AV DIALY SHUNT INTRO NEEDLE/INTRACATH INITIAL W/PTA/IMG RIGHT Right 02/06/2019   IR AV DIALY SHUNT INTRO NEEDLE/INTRACATH INITIAL W/PTA/IMG RIGHT Right 05/14/2019   IR AV DIALY SHUNT INTRO NEEDLE/INTRACATH INITIAL W/PTA/IMG RIGHT Right 08/18/2020   IR AV DIALY SHUNT INTRO NEEDLE/INTRACATH INITIAL W/PTA/IMG RIGHT Right 04/21/2022   IR DIALY SHUNT INTRO NEEDLE/INTRACATH INITIAL W/IMG RIGHT Right 09/17/2019   IR DIALY SHUNT INTRO NEEDLE/INTRACATH INITIAL W/IMG RIGHT Right 12/10/2019   IR FLUORO GUIDE CV LINE RIGHT  07/10/2021   IR FLUORO GUIDE CV LINE RIGHT  06/27/2022   IR FLUORO GUIDE CV LINE RIGHT  08/06/2022   IR GENERIC HISTORICAL  07/15/2016   IR US  GUIDE VASC ACCESS LEFT 07/15/2016 Norleen Roulette, MD [facility]   IR GENERIC HISTORICAL Left 07/15/2016   IR THROMBECTOMY AV FISTULA W/THROMBOLYSIS/PTA INC/SHUNT/IMG LEFT 07/15/2016 Norleen Roulette, MD [facility]   IR GENERIC HISTORICAL  10/05/2016  IR US  GUIDE VASC ACCESS LEFT 10/05/2016 Ozell Specking, MD [facility]   IR GENERIC HISTORICAL Left 10/05/2016   IR THROMBECTOMY AV FISTULA W/THROMBOLYSIS/PTA INC/SHUNT/IMG LEFT 10/05/2016 Ozell Specking, MD [facility]   IR GENERIC HISTORICAL  10/08/2016   IR FLUORO GUIDE CV LINE RIGHT 10/08/2016 Ozell Specking, MD [facility]   IR GENERIC HISTORICAL  10/08/2016   IR US  GUIDE VASC ACCESS RIGHT 10/08/2016 Ozell Specking, MD [facility]   IR PTA AND STENT ADDL CENTRAL DIALY SEG THRU DIALY CIRCUIT RIGHT Right 12/10/2019   IR RADIOLOGIST EVAL & MGMT  11/26/2019   IR REMOVAL TUN CV CATH W/O FL  03/16/2017   IR REMOVAL TUN CV CATH W/O FL  07/22/2021   IR THROMBECTOMY AV FISTULA W/THROMBOLYSIS/PTA INC/SHUNT/IMG RIGHT Right 06/16/2018   IR THROMBECTOMY AV FISTULA W/THROMBOLYSIS/PTA INC/SHUNT/IMG RIGHT Right 06/17/2018   IR THROMBECTOMY AV FISTULA W/THROMBOLYSIS/PTA INC/SHUNT/IMG RIGHT Right 07/15/2021   IR THROMBECTOMY AV FISTULA W/THROMBOLYSIS/PTA INC/SHUNT/IMG RIGHT Right 06/29/2022   IR THROMBECTOMY AV FISTULA W/THROMBOLYSIS/PTA INC/SHUNT/IMG RIGHT Right 07/05/2022   IR THROMBECTOMY AV FISTULA W/THROMBOLYSIS/PTA INC/SHUNT/IMG RIGHT Right 08/02/2022   IR US  GUIDE VASC ACCESS RIGHT  06/17/2018   IR US  GUIDE VASC ACCESS RIGHT  06/16/2018   IR US  GUIDE VASC ACCESS RIGHT  02/06/2019   IR US  GUIDE VASC ACCESS RIGHT  05/14/2019   IR US  GUIDE VASC ACCESS RIGHT  09/17/2019   IR US  GUIDE VASC ACCESS RIGHT  12/10/2019   IR US  GUIDE VASC ACCESS RIGHT  08/18/2020   IR US  GUIDE VASC ACCESS RIGHT  07/10/2021   IR US  GUIDE VASC ACCESS RIGHT  07/19/2021   IR US  GUIDE VASC ACCESS RIGHT  04/21/2022   IR US  GUIDE VASC ACCESS RIGHT  06/27/2022   IR US  GUIDE VASC ACCESS RIGHT  06/29/2022   IR US  GUIDE VASC ACCESS RIGHT  07/05/2022   IR US  GUIDE VASC ACCESS RIGHT  08/02/2022   IR US  GUIDE VASC ACCESS RIGHT  08/06/2022   IVC VENOGRAPHY N/A 10/27/2022   Procedure: IVC Venography;  Surgeon: Gretta Lonni PARAS, MD;  Location: [facility] INVASIVE CV LAB;  Service: Cardiovascular;  Laterality: N/A;   PARATHYROIDECTOMY N/A 04/24/2014   Procedure: TOTAL 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;  Surgeon: Krystal CHRISTELLA Spinner, MD;  Location: [facility] OR;  Service: General;  Laterality: N/A;  NECK AND LEFT FOREARM   PERIPHERAL VASCULAR CATHETERIZATION N/A 05/05/2016   Procedure: A/V Shuntogram;  Surgeon: Redell LITTIE Door, MD;  Location: [facility] INVASIVE CV LAB;  Service:  Cardiovascular;  Laterality: N/A;   PERIPHERAL VASCULAR CATHETERIZATION Left 05/05/2016   Procedure: Peripheral Vascular Balloon Angioplasty;  Surgeon: Redell LITTIE Door, MD;  Location: [facility] INVASIVE CV LAB;  Service: Cardiovascular;  Laterality: Left;   PSEUDOANEURYSM REPAIR Left 11/09/2015   TEE WITHOUT CARDIOVERSION N/A 02/19/2021   Procedure: TRANSESOPHAGEAL ECHOCARDIOGRAM (TEE);  Surgeon: Mona Vinie BROCKS, MD;  Location: [facility] ENDOSCOPY;  Service: Cardiovascular;  Laterality: N/A;   THROMBECTOMY / ARTERIOVENOUS GRAFT REVISION Left 08/18/2015   thigh   THROMBECTOMY AND REVISION OF ARTERIOVENTOUS (AV) GORETEX  GRAFT Left 08/23/2015   Procedure: THROMBECTOMY AND REVISION OF ARTERIOVENTOUS (AV) GORETEX  GRAFT LEFT THIGH ;  Surgeon: Lonni GORMAN Blade, MD;  Location: [facility] OR;  Service: Vascular;  Laterality: Left;   THROMBECTOMY W/ EMBOLECTOMY Left 08/18/2015   Procedure: THROMBECTOMY  AND REVISION ARTERIOVENOUS GORE-TEX GRAFT/LEFT THIGH;  Surgeon: Gaile LELON New, MD;  Location: [facility] OR;  Service: Vascular;  Laterality: Left;   THROMBECTOMY W/ EMBOLECTOMY Right 06/19/2018  Procedure: THROMBECTOMY AND REVISION OF RIGHT THIGH  ARTERIOVENOUS GORE-TEX GRAFT;  Surgeon: Eliza Lonni RAMAN, MD;  Location: [facility] OR;  Service: Vascular;  Laterality: Right;   UPPER EXTREMITY VENOGRAPHY Bilateral 12/27/2016   Procedure: Upper Extremity Venography;  Surgeon: Gaile LELON New, MD;  Location: [facility] INVASIVE CV LAB;  Service: Cardiovascular;  Laterality: Bilateral;   VENOGRAM Left 05/05/2016   Procedure: Venogram;  Surgeon: Redell LITTIE Door, MD;  Location: [facility] INVASIVE CV LAB;  Service: Cardiovascular;  Laterality: Left;  lower extremity     reports that he quit smoking about 38 years ago. His smoking use included cigarettes. He has never used smokeless tobacco. He reports that he does not drink alcohol and does not use drugs.  Allergies  Allergen Reactions   Ivp Dye [Iodinated Contrast Media] Swelling    SWELLING REACTION  UNSPECIFIED    Lisinopril Cough   Solu-Medrol  [Methylprednisolone ] Nausea And Vomiting    Family History  Problem Relation Age of Onset   Renal Disease Neg Hx     Prior to Admission medications   Medication Sig Start Date End Date Taking? Authorizing Provider  acetaminophen  (TYLENOL ) 500 MG tablet Take 1,000 mg by mouth daily as needed (for pain or headaches).   Yes [provider]  B Complex-C-Folic Acid  (DIALYVITE TABLET) TABS Take 1 tablet by mouth daily.   Yes [provider]  sevelamer  carbonate (RENVELA ) 800 MG tablet Take 1 tablet (800 mg total) by mouth 3 (three) times daily with meals. Patient taking differently: Take 2,400 mg by mouth 3 (three) times daily with meals. 03/20/23  Yes   amLODipine  (NORVASC ) 10 MG tablet Take 1 tablet (10 mg total) by mouth every night as directed. Patient not taking: Reported on 06/13/2024 11/24/23   Gerome Lucie MATSU, PA-C  cloNIDine  (CATAPRES ) 0.1 MG tablet Take 0.1 mg by mouth 2 (two) times daily. Patient not taking: Reported on 06/13/2024    [provider]  fluticasone  (FLONASE ) 50 MCG/ACT nasal spray Place 2 sprays into both nostrils daily. Patient not taking: Reported on 06/15/2018 07/25/17 02/20/20  Jarold Olam HERO, PA-C    Physical Exam: Vitals:   06/12/24 2209 06/12/24 2215 06/13/24 0100 06/13/24 0253  BP: 112/75 91/69 (!) 90/57   Pulse: 63 72 64   Resp: 19 19 12    Temp: 97.8 F (36.6 C)   97.8 F (36.6 C)  TempSrc: Oral   Oral  SpO2: 100% 100% 100%   Weight:      Height:        Physical Exam Vitals reviewed.  Constitutional:      General: He is not in acute distress. HENT:     Head: Normocephalic and atraumatic.  Eyes:     Extraocular Movements: Extraocular movements intact.  Cardiovascular:     Rate and Rhythm: Normal rate and regular rhythm.     Pulses: Normal pulses.  Pulmonary:     Effort: Pulmonary effort is normal. No respiratory distress.     Breath sounds: Normal breath sounds. No  wheezing or rales.  Abdominal:     General: Bowel sounds are normal. There is no distension.     Palpations: Abdomen is soft.     Tenderness: There is no abdominal tenderness. There is no guarding.  Musculoskeletal:     Cervical back: Normal range of motion.     Right lower leg: No edema.     Left lower leg: No edema.  Skin:    General: Skin is warm and dry.  Neurological:     General: No focal deficit present.     Mental Status: He is alert and oriented to person, place, and time.     Labs on Admission: I have personally reviewed following labs and imaging studies  CBC: Recent Labs  Lab 06/12/24 1451  WBC 2.0*  HGB 7.6*  HCT 24.1*  MCV 98.8  PLT 119*   Basic Metabolic Panel: Recent Labs  Lab 06/12/24 1517 06/12/24 1638  NA  --  139  K  --  3.0*  CL  --  99  CO2  --  29  GLUCOSE  --  101*  BUN  --  30*  CREATININE  --  11.13*  CALCIUM   --  7.6*  MG 2.4  --    GFR: Estimated Creatinine Clearance: 7.4 mL/min (A) (by C-G formula based on SCr of 11.13 mg/dL (H)). Liver Function Tests: Recent Labs  Lab 06/12/24 1638  AST 17  ALT 10  ALKPHOS 61  BILITOT 0.5  PROT 6.8  ALBUMIN  3.0*   No results for input(s): LIPASE, AMYLASE in the last 168 hours. No results for input(s): AMMONIA in the last 168 hours. Coagulation Profile: Recent Labs  Lab 06/12/24 1517  INR 1.0   Cardiac Enzymes: No results for input(s): CKTOTAL, CKMB, CKMBINDEX, TROPONINI in the last 168 hours. BNP (last 3 results) No results for input(s): PROBNP in the last 8760 hours. HbA1C: No results for input(s): HGBA1C in the last 72 hours. CBG: No results for input(s): GLUCAP in the last 168 hours. Lipid Profile: No results for input(s): CHOL, HDL, LDLCALC, TRIG, CHOLHDL, LDLDIRECT in the last 72 hours. Thyroid Function Tests: No results for input(s): TSH, T4TOTAL, FREET4, T3FREE, THYROIDAB in the last 72 hours. Anemia Panel: Recent Labs     06/12/24 2004  VITAMINB12 779  FOLATE 14.0  FERRITIN 253  TIBC 214*  IRON 42*  RETICCTPCT 2.4   Urine analysis:    Component Value Date/Time   COLORURINE YELLOW 08/01/2009 0742   APPEARANCEUR TURBID (A) 08/01/2009 0742   LABSPEC 1.018 08/01/2009 0742   PHURINE 5.5 08/01/2009 0742   GLUCOSEU NEGATIVE 08/01/2009 0742   HGBUR LARGE (A) 08/01/2009 0742   BILIRUBINUR SMALL (A) 08/01/2009 0742   KETONESUR 15 (A) 08/01/2009 0742   PROTEINUR 100 (A) 08/01/2009 0742   UROBILINOGEN 1.0 08/01/2009 0742   NITRITE NEGATIVE 08/01/2009 0742   LEUKOCYTESUR LARGE (A) 08/01/2009 0742    Radiological Exams on Admission: CT CHEST ABDOMEN PELVIS WO CONTRAST Result Date: 06/12/2024 CLINICAL DATA:  Sepsis EXAM: CT CHEST, ABDOMEN AND PELVIS WITHOUT CONTRAST TECHNIQUE: Multidetector CT imaging of the chest, abdomen and pelvis was performed following the standard protocol without IV contrast. RADIATION DOSE REDUCTION: This exam was performed according to the departmental dose-optimization program which includes automated exposure control, adjustment of the mA and/or kV according to patient size and/or use of iterative reconstruction technique. COMPARISON:  07/05/2022 FINDINGS: CT CHEST FINDINGS Cardiovascular: Heart is normal size. Aorta is normal caliber. Coronary artery and aortic atherosclerosis. Right dialysis catheter tip in the SVC. Mediastinum/Nodes: No mediastinal, hilar, or axillary adenopathy. Trachea and esophagus are unremarkable. Thyroid unremarkable. Lungs/Pleura: Lungs are clear. No focal airspace opacities or suspicious nodules. No effusions. Musculoskeletal: Chest wall soft tissues are unremarkable. No acute bony abnormality. CT ABDOMEN PELVIS FINDINGS Hepatobiliary: No focal hepatic abnormality. Gallbladder unremarkable. Pancreas: No focal abnormality or ductal dilatation. Spleen: No focal abnormality.  Normal size. Adrenals/Urinary Tract: Adrenal glands normal. Numerous cysts throughout the  kidneys compatible with  polycystic kidney disease. No hydronephrosis. Urinary bladder decompressed. Stomach/Bowel: Normal appendix. Right colonic diverticulosis. No active diverticulitis. Stomach and small bowel decompressed. No obstruction or inflammatory process. Vascular/Lymphatic: Scattered aortic atherosclerosis. No evidence of aneurysm or adenopathy. Right iliac venous stent in place. Bilateral upper thigh dialysis grafts partially visualized. Again noted in the left groin is a 3.7 x 3.4 cm mass adjacent to the vessels and graft, unchanged since prior study. 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 Reproductive: No visible focal abnormality. Other: No free fluid or free air. Musculoskeletal: No acute bony abnormality. IMPRESSION: No acute findings in the chest, abdomen or pelvis. Evidence of polycystic kidney disease.  No hydronephrosis. Right colonic diverticulosis.  No active diverticulitis. Coronary artery disease, aortic atherosclerosis. Electronically Signed   By: Franky Crease M.D.   On: 06/12/2024 21:04   DG Chest Port 1 View Result Date: 06/12/2024 CLINICAL DATA:  Chest pain EXAM: PORTABLE CHEST 1 VIEW COMPARISON:  November 10, 2022 FINDINGS: Unchanged right IJ approach central venous catheter. No focal consolidation. No pleural effusions. No pneumothorax. Unchanged cardiomediastinal silhouette. No acute osseous findings. IMPRESSION: No active disease. Electronically Signed   By: Michaeline Blanch M.D.   On: 06/12/2024 15:07    EKG: Independently reviewed. Sinus rhythm, no significant change compared to previous EKGs.   Assessment and Plan  Hypotension Likely related to intravascular volume removal via dialysis.  Blood pressure now improved after 1.8 L IV fluids.  Leukopenia appears to be chronic.  No fever, tachycardia, or lactic acidosis to suggest sepsis.  Hemoglobin has dropped approximately 6 g since February 2024 but no obvious signs of bleeding.  CT  chest/abdomen/pelvis without contrast negative for acute findings.  Hold home antihypertensives at this time.  Follow-up blood cultures.  Normocytic anemia Hemoglobin 7.6, previously 13.4 in February 2024 (no recent labs for comparison).  MCV 98.8.  Patient is not endorsing symptoms of GI bleed and FOBT negative.  No other obvious bleeding.  Anemia panel showing low iron and low TIBC consistent with anemia of chronic disease.  Type and screen, continue to monitor H&H.  Transfuse PRBCs if hemoglobin is less than 7.  Consult nephrology in the morning.  ESRD on HD MWF Last HD was yesterday.  Potassium 3.0, bicarb 29.  No indication for urgent dialysis overnight.  Consult nephrology in the morning.  Chest pain and dizziness Likely related to hypotension and anemia.  Symptoms have now resolved.  Workup not suggestive of ACS.  PE less likely given no tachycardia or hypoxia.  Patient is satting 100% on room air.  Mild thrombocytopenia No signs of bleeding, monitor labs.  Chronic HFmrEF EF 45 to 50% per echo 05/2022.  No signs of volume overload at this time.  DVT prophylaxis: SCDs Code Status: Full Code (discussed with the patient) Family Communication: No family available at this time. Level of care: Progressive Care Unit Admission status: It is my clinical opinion that referral for OBSERVATION is reasonable and necessary in this patient based on the above information provided. The aforementioned taken together are felt to place the patient at high risk for further clinical deterioration. However, it is anticipated that the patient may be medically stable for discharge from the hospital within 24 to 48 hours.  Editha Ram MD [facility]  If 7PM-7AM, please contact night-coverage [email or web address]  06/13/2024, 3:32 AM

## 2024-06-13 NOTE — ED Notes (Signed)
 PT was not in my care when morning meds were scheduled

## 2024-06-13 NOTE — ED Notes (Addendum)
 Morning medications not given by previously assigned RN at scheduled time.

## 2024-06-13 NOTE — ED Notes (Signed)
Phlebotomy requested for labs  

## 2024-06-13 NOTE — ED Notes (Signed)
 CCMD called.

## 2024-06-13 NOTE — Progress Notes (Signed)
 PROGRESS NOTE  [name]  DOB: [birth year]  PCP: Celestia Rosaline SQUIBB, NP FMW:[id number]  DOA: 06/12/2024  LOS: 0 days  Hospital Day: 2  Brief narrative: [name] is a 53 y.o. male with PMH significant for ESRD-HD-MWF, HTN, CHF, mild MR, chronic anemia, hypothyroidism, anxiety/depression. 9/10, patient was brought to the ED from home by EMS with complaint of chest pain, dizziness that started after he returned from his routine dialysis.  Patient believes more than usual amount of fluid was taken out at dialysis.  Also reports that per his nephrologist recommendation, he has not been taking amlodipine  and losartan for past 4 days. Per EMS, blood pressure was soft, gave gentle IV fluid.  In the ED, patient was afebrile, heart rate in 80s, blood pressure was low in 70s and 80s for several hours, breathing on room air. WBC count 2, hemoglobin 7.6 (previous 13.4 in February 2024), platelet 119, potassium 3 FOBT negative Chest x-ray unremarkable CT chest/abdomen/pelvis without contrast negative for acute findings.   Patient was given 1.5 L normal saline after which blood pressure improved.   Subjective: Patient was seen and examined this morning. Pleasant middle-aged African-American male.  Lying on the bed.  Not in distress. Overnight, afebrile, heart rate in 60s, blood pressure 90s Labs from this morning with potassium 4.5 CBC this morning showed hemoglobin at 13.6, much better than 7.6 yesterday  Assessment and plan: Hypotension Presented with chest pain and dizziness, noted to be hypotensive Likely related to intravascular volume removal via dialysis.  Probably also contributed to anemia In the last 24 hours, blood pressure has gradually improved to 90s with gentle hydration. No evidence of infection Continue to monitor blood pressure.  May benefit from midodrine .  Defer to nephrology   ESRD HD MWF Last dialysis yesterday. Nephrology consulted  Chest  pain/dizziness  Chronic systolic CHF Echo from 2023 with EF 45 to 50% Troponin normal Repeat echo to rule out WMA and any further drop in EF given persistent hypotension   Acute on chronic anemia Presented with hemoglobin of 7.6, previously 13.4 in February 24.  No recent labs for comparison. No obvious bleeding.  FOBT negative. CT chest/abdomen/pelvis negative for acute findings Repeat labs this morning shows hemoglobin 13.6 again.  I believe the low value of 7.6 yesterday was an error. Recent Labs    06/12/24 1451 06/12/24 2004 06/13/24 0811  HGB 7.6*  --  13.6  MCV 98.8  --  99.1  VITAMINB12  --  779  --   FOLATE  --  14.0  --   FERRITIN  --  253  --   TIBC  --  214*  --   IRON  --  42*  --   RETICCTPCT  --  2.4  --    Mild thrombocytopenia No signs of bleeding, monitor labs. Recent Labs  Lab 06/12/24 1451 06/13/24 0811  PLT 119* 207     Mobility: Independent.  Encourage ambulation  Goals of care   Code Status: Full Code     DVT prophylaxis:  SCDs Start: 06/13/24 0420   Antimicrobials: None Fluid: None Consultants: Nephrology Family Communication: None at bedside  Status: Observation Level of care:  Telemetry Medical was ordered on admission.  Can switch to med/tele  Patient is from: Home Needs to continue in-hospital care: Needs workup for low blood pressure, needs repeat echo Anticipated d/c to: Home hopefully 1 to 2 days    Diet:  Diet Order     None  Scheduled Meds:  multivitamin  1 tablet Oral Daily   sevelamer  carbonate  2,400 mg Oral TID WC    PRN meds: acetaminophen  **OR** acetaminophen    Infusions:    Antimicrobials: Anti-infectives (From admission, onward)    None       Objective: Vitals:   06/13/24 0855 06/13/24 0857  BP:  99/75  Pulse:  63  Resp:  16  Temp:  98.2 F (36.8 C)  SpO2: 100% 100%    Intake/Output Summary (Last 24 hours) at 06/13/2024 1106 Last data filed at 06/12/2024 1702 Gross per 24  hour  Intake 500 ml  Output --  Net 500 ml   Filed Weights   06/12/24 1431  Weight: 67 kg   Weight change:  Body mass index is 20.03 kg/m.   Physical Exam: General exam: Pleasant, middle-aged African-American male Skin: No rashes, lesions or ulcers. HEENT: Atraumatic, normocephalic, no obvious bleeding Lungs: Clear to auscultation bilaterally,  CVS: S1, S2, no murmur,   GI/Abd: Soft, nontender, nondistended, bowel sound present,   CNS: Alert, awake, oriented x 3 Psychiatry: Mood appropriate Extremities: No pedal edema, no calf tenderness,   Data Review: I have personally reviewed the laboratory data and studies available.  F/u labs ordered Unresulted Labs (From admission, onward)     Start     Ordered   06/12/24 1841  Resp panel by RT-PCR (RSV, Flu A&B, Covid)  Once,   URGENT        06/12/24 1840   Unscheduled  CBC with Differential/Platelet  Tomorrow morning,   R        06/13/24 1106   Unscheduled  Basic metabolic panel with GFR  Tomorrow morning,   R        06/13/24 1106            Signed, Chapman Rota, MD [facility] 06/13/2024

## 2024-06-14 ENCOUNTER — Other Ambulatory Visit (HOSPITAL_COMMUNITY): Payer: Self-pay

## 2024-06-14 LAB — CBC WITH DIFFERENTIAL/PLATELET
Abs Immature Granulocytes: 0.01 K/uL (ref 0.00–0.07)
Basophils Absolute: 0 K/uL (ref 0.0–0.1)
Basophils Relative: 1 %
Eosinophils Absolute: 0.1 K/uL (ref 0.0–0.5)
Eosinophils Relative: 1 %
HCT: 38.8 % — ABNORMAL LOW (ref 39.0–52.0)
Hemoglobin: 12.6 g/dL — ABNORMAL LOW (ref 13.0–17.0)
Immature Granulocytes: 0 %
Lymphocytes Relative: 23 %
Lymphs Abs: 1.2 K/uL (ref 0.7–4.0)
MCH: 31.3 pg (ref 26.0–34.0)
MCHC: 32.5 g/dL (ref 30.0–36.0)
MCV: 96.3 fL (ref 80.0–100.0)
Monocytes Absolute: 0.7 K/uL (ref 0.1–1.0)
Monocytes Relative: 14 %
Neutro Abs: 3.3 K/uL (ref 1.7–7.7)
Neutrophils Relative %: 61 %
Platelets: 209 K/uL (ref 150–400)
RBC: 4.03 MIL/uL — ABNORMAL LOW (ref 4.22–5.81)
RDW: 13.5 % (ref 11.5–15.5)
WBC: 5.4 K/uL (ref 4.0–10.5)
nRBC: 0 % (ref 0.0–0.2)

## 2024-06-14 LAB — BASIC METABOLIC PANEL WITH GFR
Anion gap: 18 — ABNORMAL HIGH (ref 5–15)
BUN: 62 mg/dL — ABNORMAL HIGH (ref 6–20)
CO2: 21 mmol/L — ABNORMAL LOW (ref 22–32)
Calcium: 7.2 mg/dL — ABNORMAL LOW (ref 8.9–10.3)
Chloride: 99 mmol/L (ref 98–111)
Creatinine, Ser: 16.24 mg/dL — ABNORMAL HIGH (ref 0.61–1.24)
GFR, Estimated: 3 mL/min — ABNORMAL LOW (ref >60)
Glucose, Bld: 79 mg/dL (ref 70–99)
Potassium: 4.1 mmol/L (ref 3.5–5.1)
Sodium: 138 mmol/L (ref 135–145)

## 2024-06-14 LAB — HEPATITIS B SURFACE ANTIGEN: Hepatitis B Surface Ag: REACTIVE — AB

## 2024-06-14 MED ORDER — LIDOCAINE HCL (PF) 1 % IJ SOLN: 5.0000 mL | INTRAMUSCULAR | Status: DC | PRN

## 2024-06-14 MED ORDER — HEPARIN SODIUM (PORCINE) 1000 UNIT/ML DIALYSIS
1000.0000 [IU] | INTRAMUSCULAR | Status: DC | PRN
  Administered 2024-06-14: 2500 [IU] via INTRAVENOUS_CENTRAL
  Administered 2024-06-14: 3800 [IU] via INTRAVENOUS_CENTRAL

## 2024-06-14 MED ORDER — ALTEPLASE 2 MG IJ SOLR: 2.0000 mg | Freq: Once | INTRAMUSCULAR | Status: DC | PRN

## 2024-06-14 MED ORDER — MIDODRINE HCL 10 MG PO TABS
10.0000 mg | ORAL_TABLET | Freq: Two times a day (BID) | ORAL | 0 refills | Status: AC
  Filled 2024-06-14: qty 30, 15d supply, fill #0

## 2024-06-14 MED ORDER — MIDODRINE HCL 5 MG PO TABS
10.0000 mg | ORAL_TABLET | Freq: Three times a day (TID) | ORAL | Status: DC
  Administered 2024-06-14 (×2): 10 mg via ORAL
  Filled 2024-06-14: qty 2

## 2024-06-14 MED ORDER — HEPARIN SODIUM (PORCINE) 1000 UNIT/ML IJ SOLN
INTRAMUSCULAR | Status: AC
  Filled 2024-06-14: qty 3

## 2024-06-14 MED ORDER — LIDOCAINE-PRILOCAINE 2.5-2.5 % EX CREA: 1.0000 | TOPICAL_CREAM | CUTANEOUS | Status: DC | PRN

## 2024-06-14 MED ORDER — PENTAFLUOROPROP-TETRAFLUOROETH EX AERO: 1.0000 | INHALATION_SPRAY | CUTANEOUS | Status: DC | PRN

## 2024-06-14 MED ORDER — ACETAMINOPHEN 325 MG PO TABS
ORAL_TABLET | ORAL | Status: AC
  Filled 2024-06-14: qty 2

## 2024-06-14 MED ORDER — MIDODRINE HCL 5 MG PO TABS
ORAL_TABLET | ORAL | Status: AC
  Filled 2024-06-14: qty 2

## 2024-06-14 MED ORDER — HEPARIN SODIUM (PORCINE) 1000 UNIT/ML IJ SOLN
INTRAMUSCULAR | Status: AC
  Filled 2024-06-14: qty 4

## 2024-06-14 NOTE — Discharge Instructions (Signed)
 Follow with Primary MD Celestia Rosaline SQUIBB, NP in 7 days   Get CBC, CMP, Magnesium , 2 view Chest X ray -  checked next visit with your primary MD    Activity: As tolerated with Full fall precautions use walker/cane & assistance as needed  Disposition Home    Diet: Renal diet with 1.5 L fluid restriction per day  Special Instructions: If you have smoked or chewed Tobacco  in the last 2 yrs please stop smoking, stop any regular Alcohol  and or any Recreational drug use.  On your next visit with your primary care physician please Get Medicines reviewed and adjusted.  Please request your Prim.MD to go over all Hospital Tests and Procedure/Radiological results at the follow up, please get all Hospital records sent to your Prim MD by signing hospital release before you go home.  If you experience worsening of your admission symptoms, develop shortness of breath, life threatening emergency, suicidal or homicidal thoughts you must seek medical attention immediately by calling 911 or calling your MD immediately  if symptoms less severe.  You Must read complete instructions/literature along with all the possible adverse reactions/side effects for all the Medicines you take and that have been prescribed to you. Take any new Medicines after you have completely understood and accpet all the possible adverse reactions/side effects.   Do not drive when taking Pain medications.  Do not take more than prescribed Pain, Sleep and Anxiety Medications  Wear Seat belts while driving.

## 2024-06-14 NOTE — Progress Notes (Signed)
 Received patient in bed to unit.  Alert and oriented.  Informed consent signed and in chart.   TX duration: 3 hours and 15 minutes Patient tolerated well.  Transported back to the room  Alert, without acute distress.  Hand-off given to patient's nurse.   Access used: R internal jugular HD cath Access issues: A-V and V-A  Total UF removed: 1.5L Medication(s) given: Tylenol  , 12 PM midodrine    06/14/24 1230  Vitals  Temp 97.9 F (36.6 C)  BP 105/75  MAP (mmHg) 83  Pulse Rate 61  ECG Heart Rate 60  Resp 17  Weight 65.9 kg  Type of Weight Post-Dialysis  Oxygen Therapy  SpO2 100 %  O2 Device Room Air  During Treatment Monitoring  Blood Flow Rate (mL/min) 299 mL/min  Arterial Pressure (mmHg) -179.99 mmHg  Venous Pressure (mmHg) 168.48 mmHg  TMP (mmHg) 13.53 mmHg  Ultrafiltration Rate (mL/min) 236 mL/min  Dialysate Flow Rate (mL/min) 299 ml/min  Duration of HD Treatment -hour(s) 3.25 hour(s)  Cumulative Fluid Removed (mL) per Treatment  1500.05  HD Safety Checks Performed Yes  Intra-Hemodialysis Comments Tx completed  Post Treatment  Dialyzer Clearance Clear  Liters Processed 61.9  Fluid Removed (mL) 1500 mL  Tolerated HD Treatment Yes  Hemodialysis Catheter Right Internal jugular Double lumen Permanent (Tunneled)  Placement Date/Time: 08/06/22 1133   Placed prior to admission: No  Serial / Lot #: [id number]  Expiration Date: 10/29/26  Time Out: Correct patient;Correct site;Correct procedure  Maximum sterile barrier precautions: Mask;Cap;Hand hygiene;Sterile glo...  Site Condition No complications  Blue Lumen Status Flushed;Antimicrobial dead end cap;Heparin  locked  Red Lumen Status Flushed;Dead end cap in place;Antimicrobial dead end cap  Purple Lumen Status N/A  Catheter fill solution Heparin  1000 units/ml  Catheter fill volume (Arterial) 1.9 cc  Catheter fill volume (Venous) 1.9  Dressing Type Transparent  Dressing Status Antimicrobial disc/dressing in  place;Clean, Dry, Intact  Drainage Description None  Dressing Change Due 06/21/24  Post treatment catheter status Capped and Clamped     Camellia Brasil LPN [facility]

## 2024-06-14 NOTE — Progress Notes (Signed)
 [facility] Progress Note   Subjective:    Seen and examined patient on HD. Pre-HD standing weight 69.7kg. UFG set 1-2L. Noted Midodrine  raised to 10mg  TID. Ok for him to have an extra 10mg  dose mid-run PRN. Patient can go home after treatment today from my end if he remains stable. Noted discharge order.  Objective Vitals:   06/14/24 0500 06/14/24 0829 06/14/24 0835 06/14/24 0857  BP: 99/74  (!) 112/101 112/80  Pulse: 65     Resp: 15  17 18   Temp: 98 F (36.7 C)  97.8 F (36.6 C)   TempSrc: Oral  Oral   SpO2: 100%  100% 100%  Weight:  69.7 kg    Height:       Physical Exam General: Awake, alert, on RA, NAD Lungs: Clear anteriorly. No wheeze, rales or rhonchi. Breathing is unlabored. Heart: RRR. No murmur, rubs or gallops.  Abdomen: soft and non-tender Lower extremities: no LE edema Neuro: AAOx3. Moves all extremities spontaneously. Dialysis Access: [facility]  Filed Weights   06/12/24 1431 06/14/24 0829  Weight: 67 kg 69.7 kg    Intake/Output Summary (Last 24 hours) at 06/14/2024 0900 Last data filed at 06/13/2024 1950 Gross per 24 hour  Intake --  Output 0 ml  Net 0 ml    Additional Objective Labs: Basic Metabolic Panel: Recent Labs  Lab 06/12/24 1638 06/13/24 0603 06/14/24 0424  NA 139 139 138  K 3.0* 4.5 4.1  CL 99 101 99  CO2 29 22 21*  GLUCOSE 101* 77 79  BUN 30* 38* 62*  CREATININE 11.13* 13.14* 16.24*  CALCIUM  7.6* 7.5* 7.2*   Liver Function Tests: Recent Labs  Lab 06/12/24 1638  AST 17  ALT 10  ALKPHOS 61  BILITOT 0.5  PROT 6.8  ALBUMIN  3.0*   No results for input(s): LIPASE, AMYLASE in the last 168 hours. CBC: Recent Labs  Lab 06/12/24 1451 06/13/24 0811 06/14/24 0424  WBC 2.0* 4.8 5.4  NEUTROABS  --   --  3.3  HGB 7.6* 13.6 12.6*  HCT 24.1* 43.5 38.8*  MCV 98.8 99.1 96.3  PLT 119* 207 209   Blood Culture    Component Value Date/Time   SDES BLOOD SITE NOT SPECIFIED 06/12/2024 2004   SPECREQUEST  06/12/2024  2004    BOTTLES DRAWN AEROBIC AND ANAEROBIC Blood Culture adequate volume   CULT  06/12/2024 2004    NO GROWTH < 12 HOURS Performed at [facility], [address] [place], [place] [address]    REPTSTATUS PENDING 06/12/2024 2004    Cardiac Enzymes: No results for input(s): CKTOTAL, CKMB, CKMBINDEX, TROPONINI in the last 168 hours. CBG: No results for input(s): GLUCAP in the last 168 hours. Iron Studies:  Recent Labs    06/12/24 2004  IRON 42*  TIBC 214*  FERRITIN 253   Lab Results  Component Value Date   INR 1.0 06/12/2024   INR 0.9 07/02/2022   INR 0.9 06/29/2022   Studies/Results: ECHOCARDIOGRAM COMPLETE Result Date: 06/13/2024    ECHOCARDIOGRAM REPORT   Patient Name:   Jules SHAIL URBAS Date of Exam: 06/13/2024 Medical Rec #:  [id number]             Height:       72.0 in Accession #:    [id number]            Weight:       147.7 lb Date of Birth:  1970/12/30  BSA:          1.873 m Patient Age:    52 years              BP:           105/83 mmHg Patient Gender: M                     HR:           55 bpm. Exam Location:  Inpatient Procedure: 2D Echo (Both Spectral and Color Flow Doppler were utilized during            procedure). Indications:    Abnormal ECG  History:        Patient has prior history of Echocardiogram examinations.                 Signs/Symptoms:Chest Pain; Risk Factors:Hypertension.  Sonographer:    Charmaine Gaskins Referring Phys: [id number] BINAYA DAHAL IMPRESSIONS  1. Left ventricular ejection fraction, by estimation, is 60 to 65%. The left ventricle has normal function. The left ventricle has no regional wall motion abnormalities. Left ventricular diastolic parameters were normal.  2. Right ventricular systolic function is normal. The right ventricular size is normal.  3. The mitral valve is normal in structure. Trivial mitral valve regurgitation. No evidence of mitral stenosis.  4. The aortic valve is normal in structure. Aortic valve  regurgitation is not visualized. Aortic valve sclerosis is present, with no evidence of aortic valve stenosis.  5. The inferior vena cava is normal in size with greater than 50% respiratory variability, suggesting right atrial pressure of 3 mmHg. FINDINGS  Left Ventricle: Left ventricular ejection fraction, by estimation, is 60 to 65%. The left ventricle has normal function. The left ventricle has no regional wall motion abnormalities. The left ventricular internal cavity size was normal in size. There is  no left ventricular hypertrophy. Left ventricular diastolic parameters were normal. Right Ventricle: The right ventricular size is normal. No increase in right ventricular wall thickness. Right ventricular systolic function is normal. Left Atrium: Left atrial size was normal in size. Right Atrium: Right atrial size was normal in size. Pericardium: There is no evidence of pericardial effusion. Presence of epicardial fat layer. Mitral Valve: The mitral valve is normal in structure. Trivial mitral valve regurgitation. No evidence of mitral valve stenosis. Tricuspid Valve: The tricuspid valve is normal in structure. Tricuspid valve regurgitation is mild . No evidence of tricuspid stenosis. Aortic Valve: The aortic valve is normal in structure. Aortic valve regurgitation is not visualized. Aortic valve sclerosis is present, with no evidence of aortic valve stenosis. Aortic valve mean gradient measures 2.0 mmHg. Aortic valve peak gradient measures 4.1 mmHg. Aortic valve area, by VTI measures 3.89 cm. Pulmonic Valve: The pulmonic valve was normal in structure. Pulmonic valve regurgitation is not visualized. No evidence of pulmonic stenosis. Aorta: The aortic root is normal in size and structure. Venous: The inferior vena cava is normal in size with greater than 50% respiratory variability, suggesting right atrial pressure of 3 mmHg. IAS/Shunts: No atrial level shunt detected by color flow Doppler.  LEFT VENTRICLE PLAX  2D LVIDd:         4.40 cm     Diastology LVIDs:         2.90 cm     LV e' medial:    9.48 cm/s LV PW:         0.80 cm     LV E/e' medial:  9.4  LV IVS:        0.90 cm     LV e' lateral:   10.40 cm/s LVOT diam:     2.20 cm     LV E/e' lateral: 8.5 LV SV:         79 LV SV Index:   42 LVOT Area:     3.80 cm  LV Volumes (MOD) LV vol d, MOD A4C: 87.2 ml LV vol s, MOD A4C: 32.3 ml LV SV MOD A4C:     87.2 ml RIGHT VENTRICLE RV Basal diam:  2.70 cm RV Mid diam:    2.60 cm RV S prime:     11.60 cm/s LEFT ATRIUM             Index        RIGHT ATRIUM           Index LA diam:        2.90 cm 1.55 cm/m   RA Area:     11.50 cm LA Vol (A2C):   25.8 ml 13.77 ml/m  RA Volume:   25.90 ml  13.83 ml/m LA Vol (A4C):   23.5 ml 12.55 ml/m LA Biplane Vol: 26.0 ml 13.88 ml/m  AORTIC VALVE AV Area (Vmax):    3.36 cm AV Area (Vmean):   3.71 cm AV Area (VTI):     3.89 cm AV Vmax:           101.00 cm/s AV Vmean:          66.200 cm/s AV VTI:            0.204 m AV Peak Grad:      4.1 mmHg AV Mean Grad:      2.0 mmHg LVOT Vmax:         89.40 cm/s LVOT Vmean:        64.600 cm/s LVOT VTI:          0.209 m LVOT/AV VTI ratio: 1.02  AORTA Ao Root diam: 3.10 cm Ao Asc diam:  3.90 cm MITRAL VALVE               TRICUSPID VALVE MV Area (PHT): 3.95 cm    TR Peak grad:   24.8 mmHg MV Decel Time: 192 msec    TR Vmax:        249.00 cm/s MV E velocity: 88.80 cm/s MV A velocity: 83.90 cm/s  SHUNTS MV E/A ratio:  1.06        Systemic VTI:  0.21 m                            Systemic Diam: 2.20 cm Kardie Tobb DO Electronically signed by Dub Huntsman DO Signature Date/Time: 06/13/2024/2:54:34 PM    Final    CT CHEST ABDOMEN PELVIS WO CONTRAST Result Date: 06/12/2024 CLINICAL DATA:  Sepsis EXAM: CT CHEST, ABDOMEN AND PELVIS WITHOUT CONTRAST TECHNIQUE: Multidetector CT imaging of the chest, abdomen and pelvis was performed following the standard protocol without IV contrast. RADIATION DOSE REDUCTION: This exam was performed according to the departmental  dose-optimization program which includes automated exposure control, adjustment of the mA and/or kV according to patient size and/or use of iterative reconstruction technique. COMPARISON:  07/05/2022 FINDINGS: CT CHEST FINDINGS Cardiovascular: Heart is normal size. Aorta is normal caliber. Coronary artery and aortic atherosclerosis. Right dialysis catheter tip in the SVC. Mediastinum/Nodes: No mediastinal, hilar, or axillary adenopathy. Trachea and esophagus are unremarkable. Thyroid unremarkable.  Lungs/Pleura: Lungs are clear. No focal airspace opacities or suspicious nodules. No effusions. Musculoskeletal: Chest wall soft tissues are unremarkable. No acute bony abnormality. CT ABDOMEN PELVIS FINDINGS Hepatobiliary: No focal hepatic abnormality. Gallbladder unremarkable. Pancreas: No focal abnormality or ductal dilatation. Spleen: No focal abnormality.  Normal size. Adrenals/Urinary Tract: Adrenal glands normal. Numerous cysts throughout the kidneys compatible with polycystic kidney disease. No hydronephrosis. Urinary bladder decompressed. Stomach/Bowel: Normal appendix. Right colonic diverticulosis. No active diverticulitis. Stomach and small bowel decompressed. No obstruction or inflammatory process. Vascular/Lymphatic: Scattered aortic atherosclerosis. No evidence of aneurysm or adenopathy. Right iliac venous stent in place. Bilateral upper thigh dialysis grafts partially visualized. Again noted in the left groin is a 3.7 x 3.4 cm mass adjacent to the vessels and graft, unchanged since prior study. 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 Reproductive: No visible focal abnormality. Other: No free fluid or free air. Musculoskeletal: No acute bony abnormality. IMPRESSION: No acute findings in the chest, abdomen or pelvis. Evidence of polycystic kidney disease.  No hydronephrosis. Right colonic diverticulosis.  No active diverticulitis. Coronary artery disease, aortic atherosclerosis.  Electronically Signed   By: Franky Crease M.D.   On: 06/12/2024 21:04   DG Chest Port 1 View Result Date: 06/12/2024 CLINICAL DATA:  Chest pain EXAM: PORTABLE CHEST 1 VIEW COMPARISON:  November 10, 2022 FINDINGS: Unchanged right IJ approach central venous catheter. No focal consolidation. No pleural effusions. No pneumothorax. Unchanged cardiomediastinal silhouette. No acute osseous findings. IMPRESSION: No active disease. Electronically Signed   By: Michaeline Blanch M.D.   On: 06/12/2024 15:07    Medications:   Chlorhexidine  Gluconate Cloth  6 each Topical Q0600   midodrine   10 mg Oral TID WC   multivitamin  1 tablet Oral Daily   sevelamer  carbonate  2,400 mg Oral TID WC    Dialysis Orders: MWF - [facility] 4hrs, BFR 300, DFR Auto 1.5,  EDW 67.4kg, 2K/ 3Ca Heparin  2500 unit bolus with HD  Assessment/Plan: Hypotension - Home BP meds stopped 2 weeks ago by us . EDW also raised yesterday. He reports ongoing low Bps despite recent interventions. Now on Midodrine  10mg  TID. Okay to give an extra 10mg  with HD mid-run PRN. ESRD -  on HD MWF. On HD per his usual schedule. Please note patient is Hep B (+). He will be on isolation on the HD unit. Hypertension/volume  - Euvolemic on exam. See above. Pre-HD standing weight was 69.7kg. UFG set 1-2L. Anemia of CKD - Fe/ESA is not indicated at this time Secondary Hyperparathyroidism -  Checking phos in AM Nutrition - Renal diet with fluid restriction Dispo - Okay for discharge after HD today from a renal standpoint if he remains stable.  Charmaine Piety, NP [facility] 06/14/2024,9:00 AM  LOS: 0 days

## 2024-06-14 NOTE — Discharge Summary (Signed)
 [name] FMW:[id number] DOB: [birth year] DOA: 06/12/2024  PCP: Celestia Rosaline SQUIBB, NP  Admit date: 06/12/2024  Discharge date: 06/14/2024  Admitted From: Home   Disposition:  Home   Recommendations for Outpatient Follow-up:   Follow up with PCP in 1-2 weeks  PCP Please obtain BMP/CBC, 2 view CXR in 1week,  (see Discharge instructions)   PCP Please follow up on the following pending results:    Home Health: None   Equipment/Devices: None  Consultations: Renal Discharge Condition: Stable    CODE STATUS: Full    Diet Recommendation: Renal diet with 1.5 L fluid restriction per day    Chief Complaint  Patient presents with   Chest Pain     Brief history of present illness from the day of admission and additional interim summary     53 y.o. male with PMH significant for ESRD-HD-MWF, HTN, CHF, mild MR, chronic anemia, hypothyroidism, anxiety/depression. 9/10, patient was brought to the ED from home by EMS with complaint of chest pain, dizziness that started after he returned from his routine dialysis.  Patient believes more than usual amount of fluid was taken out at dialysis.  Also reports that per his nephrologist recommendation, he has not been taking amlodipine  and losartan for past 4 days. Per EMS, blood pressure was soft, gave gentle IV fluid.   In the ED, patient was afebrile, heart rate in 80s, blood pressure was low in 70s and 80s for several hours, breathing on room air. WBC count 2, hemoglobin 7.6 (previous 13.4 in February 2024), platelet 119, potassium 3 FOBT negative Chest x-ray unremarkable CT chest/abdomen/pelvis without contrast negative for acute findings.   Patient was given 1.5 L normal saline after which blood pressure improved.                                                                   Hospital Course    Hypotension Presented with chest pain and dizziness, noted to be hypotensive Likely related to intravascular volume depletion due to HD and being on blood pressure medications.  He was hydrated with IV fluids, blood pressure medications held placed on midodrine .  Completely symptom-free now blood pressure stable he is eager to leave the hospital as soon as possible, have requested him to kindly stay till his dialysis is completed thereafter will go home.  He says he is completely symptom-free and does not want to stay any longer.  His blood pressure medications for now have been discontinued he has been placed on twice daily midodrine  PCP to monitor further.   ESRD HD MWF Last dialysis yesterday. Nephrology consulted, wants to go home soon after his dialysis today   Chest pain/dizziness Likely due to hypotension and hypoperfusion completely resolved eager to go home   Chronic systolic CHF Clinically compensated  actually was dehydrated upon admission, repeat echo shows much improved EF of 60%, follow-up with PCP and primary cardiologist postdischarge   Anemia of chronic disease with mild chronic thrombocytopenia. Stable no acute issues PCP to monitor  Discharge diagnosis     Principal Problem:   Hypotension Active Problems:   ESRD on dialysis (HCC)   Anemia   Chest pain   Thrombocytopenia [facility])    Discharge instructions    Discharge Instructions     Discharge instructions   Complete by: As directed    Follow with Primary MD Celestia Rosaline SQUIBB, NP in 7 days   Get CBC, CMP, Magnesium , 2 view Chest X ray -  checked next visit with your primary MD    Activity: As tolerated with Full fall precautions use walker/cane & assistance as needed  Disposition Home    Diet: Renal diet with 1.5 L fluid restriction per day  Special Instructions: If you have smoked or chewed Tobacco  in the last 2 yrs please stop smoking, stop any regular Alcohol  and  or any Recreational drug use.  On your next visit with your primary care physician please Get Medicines reviewed and adjusted.  Please request your Prim.MD to go over all Hospital Tests and Procedure/Radiological results at the follow up, please get all Hospital records sent to your Prim MD by signing hospital release before you go home.  If you experience worsening of your admission symptoms, develop shortness of breath, life threatening emergency, suicidal or homicidal thoughts you must seek medical attention immediately by calling 911 or calling your MD immediately  if symptoms less severe.  You Must read complete instructions/literature along with all the possible adverse reactions/side effects for all the Medicines you take and that have been prescribed to you. Take any new Medicines after you have completely understood and accpet all the possible adverse reactions/side effects.   Do not drive when taking Pain medications.  Do not take more than prescribed Pain, Sleep and Anxiety Medications  Wear Seat belts while driving.   Increase activity slowly   Complete by: As directed    No wound care   Complete by: As directed        Discharge Medications   Allergies as of 06/14/2024       Reactions   Ivp Dye [iodinated Contrast Media] Swelling   SWELLING REACTION UNSPECIFIED    Lisinopril Cough   Solu-medrol  [methylprednisolone ] Nausea And Vomiting        Medication List     STOP taking these medications    amLODipine  10 MG tablet Commonly known as: NORVASC    cloNIDine  0.1 MG tablet Commonly known as: CATAPRES        TAKE these medications    acetaminophen  500 MG tablet Commonly known as: TYLENOL  Take 1,000 mg by mouth daily as needed (for pain or headaches).   DIALYVITE TABLET Tabs Take 1 tablet by mouth daily.   midodrine  10 MG tablet Commonly known as: PROAMATINE  Take 1 tablet (10 mg total) by mouth 2 (two) times daily with a meal.   sevelamer  carbonate 800  MG tablet Commonly known as: RENVELA  Take 1 tablet (800 mg total) by mouth 3 (three) times daily with meals. What changed: how much to take         Follow-up Information     Celestia Rosaline SQUIBB, NP. Schedule an appointment as soon as possible for a visit in 1 week(s).   Specialty: Internal Medicine Contact information: [address] [place] [employer] [place]  [phone]                 Major procedures and Radiology Reports - PLEASE review detailed and final reports thoroughly  -       ECHOCARDIOGRAM COMPLETE Result Date: 06/13/2024    ECHOCARDIOGRAM REPORT   Patient Name:   [name] Date of Exam: 06/13/2024 Medical Rec #:  [id number]             Height:       72.0 in Accession #:    [id number]            Weight:       147.7 lb Date of Birth:  [birth year]             BSA:          1.873 m Patient Age:    52 years              BP:           105/83 mmHg Patient Gender: M                     HR:           55 bpm. Exam Location:  Inpatient Procedure: 2D Echo (Both Spectral and Color Flow Doppler were utilized during            procedure). Indications:    Abnormal ECG  History:        Patient has prior history of Echocardiogram examinations.                 Signs/Symptoms:Chest Pain; Risk Factors:Hypertension.  Sonographer:    Charmaine Gaskins Referring Phys: [id number] BINAYA DAHAL IMPRESSIONS  1. Left ventricular ejection fraction, by estimation, is 60 to 65%. The left ventricle has normal function. The left ventricle has no regional wall motion abnormalities. Left ventricular diastolic parameters were normal.  2. Right ventricular systolic function is normal. The right ventricular size is normal.  3. The mitral valve is normal in structure. Trivial mitral valve regurgitation. No evidence of mitral stenosis.  4. The aortic valve is normal in structure. Aortic valve regurgitation is not visualized. Aortic valve sclerosis is present, with no evidence of aortic valve stenosis.  5. The  inferior vena cava is normal in size with greater than 50% respiratory variability, suggesting right atrial pressure of 3 mmHg. FINDINGS  Left Ventricle: Left ventricular ejection fraction, by estimation, is 60 to 65%. The left ventricle has normal function. The left ventricle has no regional wall motion abnormalities. The left ventricular internal cavity size was normal in size. There is  no left ventricular hypertrophy. Left ventricular diastolic parameters were normal. Right Ventricle: The right ventricular size is normal. No increase in right ventricular wall thickness. Right ventricular systolic function is normal. Left Atrium: Left atrial size was normal in size. Right Atrium: Right atrial size was normal in size. Pericardium: There is no evidence of pericardial effusion. Presence of epicardial fat layer. Mitral Valve: The mitral valve is normal in structure. Trivial mitral valve regurgitation. No evidence of mitral valve stenosis. Tricuspid Valve: The tricuspid valve is normal in structure. Tricuspid valve regurgitation is mild . No evidence of tricuspid stenosis. Aortic Valve: The aortic valve is normal in structure. Aortic valve regurgitation is not visualized. Aortic valve sclerosis is present, with no evidence of aortic valve stenosis. Aortic valve mean gradient measures 2.0 mmHg. Aortic valve peak gradient measures 4.1 mmHg. Aortic valve area, by VTI  measures 3.89 cm. Pulmonic Valve: The pulmonic valve was normal in structure. Pulmonic valve regurgitation is not visualized. No evidence of pulmonic stenosis. Aorta: The aortic root is normal in size and structure. Venous: The inferior vena cava is normal in size with greater than 50% respiratory variability, suggesting right atrial pressure of 3 mmHg. IAS/Shunts: No atrial level shunt detected by color flow Doppler.  LEFT VENTRICLE PLAX 2D LVIDd:         4.40 cm     Diastology LVIDs:         2.90 cm     LV e' medial:    9.48 cm/s LV PW:         0.80 cm      LV E/e' medial:  9.4 LV IVS:        0.90 cm     LV e' lateral:   10.40 cm/s LVOT diam:     2.20 cm     LV E/e' lateral: 8.5 LV SV:         79 LV SV Index:   42 LVOT Area:     3.80 cm  LV Volumes (MOD) LV vol d, MOD A4C: 87.2 ml LV vol s, MOD A4C: 32.3 ml LV SV MOD A4C:     87.2 ml RIGHT VENTRICLE RV Basal diam:  2.70 cm RV Mid diam:    2.60 cm RV S prime:     11.60 cm/s LEFT ATRIUM             Index        RIGHT ATRIUM           Index LA diam:        2.90 cm 1.55 cm/m   RA Area:     11.50 cm LA Vol (A2C):   25.8 ml 13.77 ml/m  RA Volume:   25.90 ml  13.83 ml/m LA Vol (A4C):   23.5 ml 12.55 ml/m LA Biplane Vol: 26.0 ml 13.88 ml/m  AORTIC VALVE AV Area (Vmax):    3.36 cm AV Area (Vmean):   3.71 cm AV Area (VTI):     3.89 cm AV Vmax:           101.00 cm/s AV Vmean:          66.200 cm/s AV VTI:            0.204 m AV Peak Grad:      4.1 mmHg AV Mean Grad:      2.0 mmHg LVOT Vmax:         89.40 cm/s LVOT Vmean:        64.600 cm/s LVOT VTI:          0.209 m LVOT/AV VTI ratio: 1.02  AORTA Ao Root diam: 3.10 cm Ao Asc diam:  3.90 cm MITRAL VALVE               TRICUSPID VALVE MV Area (PHT): 3.95 cm    TR Peak grad:   24.8 mmHg MV Decel Time: 192 msec    TR Vmax:        249.00 cm/s MV E velocity: 88.80 cm/s MV A velocity: 83.90 cm/s  SHUNTS MV E/A ratio:  1.06        Systemic VTI:  0.21 m                            Systemic Diam: 2.20 cm Dub Tobb DO Electronically signed by  Kardie Tobb DO Signature Date/Time: 06/13/2024/2:54:34 PM    Final    CT CHEST ABDOMEN PELVIS WO CONTRAST Result Date: 06/12/2024 CLINICAL DATA:  Sepsis EXAM: CT CHEST, ABDOMEN AND PELVIS WITHOUT CONTRAST TECHNIQUE: Multidetector CT imaging of the chest, abdomen and pelvis was performed following the standard protocol without IV contrast. RADIATION DOSE REDUCTION: This exam was performed according to the departmental dose-optimization program which includes automated exposure control, adjustment of the mA and/or kV according to patient size  and/or use of iterative reconstruction technique. COMPARISON:  07/05/2022 FINDINGS: CT CHEST FINDINGS Cardiovascular: Heart is normal size. Aorta is normal caliber. Coronary artery and aortic atherosclerosis. Right dialysis catheter tip in the SVC. Mediastinum/Nodes: No mediastinal, hilar, or axillary adenopathy. Trachea and esophagus are unremarkable. Thyroid unremarkable. Lungs/Pleura: Lungs are clear. No focal airspace opacities or suspicious nodules. No effusions. Musculoskeletal: Chest wall soft tissues are unremarkable. No acute bony abnormality. CT ABDOMEN PELVIS FINDINGS Hepatobiliary: No focal hepatic abnormality. Gallbladder unremarkable. Pancreas: No focal abnormality or ductal dilatation. Spleen: No focal abnormality.  Normal size. Adrenals/Urinary Tract: Adrenal glands normal. Numerous cysts throughout the kidneys compatible with polycystic kidney disease. No hydronephrosis. Urinary bladder decompressed. Stomach/Bowel: Normal appendix. Right colonic diverticulosis. No active diverticulitis. Stomach and small bowel decompressed. No obstruction or inflammatory process. Vascular/Lymphatic: Scattered aortic atherosclerosis. No evidence of aneurysm or adenopathy. Right iliac venous stent in place. Bilateral upper thigh dialysis grafts partially visualized. Again noted in the left groin is a 3.7 x 3.4 cm mass adjacent to the vessels and graft, unchanged since prior study. 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 Reproductive: No visible focal abnormality. Other: No free fluid or free air. Musculoskeletal: No acute bony abnormality. IMPRESSION: No acute findings in the chest, abdomen or pelvis. Evidence of polycystic kidney disease.  No hydronephrosis. Right colonic diverticulosis.  No active diverticulitis. Coronary artery disease, aortic atherosclerosis. Electronically Signed   By: Franky Crease M.D.   On: 06/12/2024 21:04   DG Chest Port 1 View Result Date: 06/12/2024 CLINICAL  DATA:  Chest pain EXAM: PORTABLE CHEST 1 VIEW COMPARISON:  November 10, 2022 FINDINGS: Unchanged right IJ approach central venous catheter. No focal consolidation. No pleural effusions. No pneumothorax. Unchanged cardiomediastinal silhouette. No acute osseous findings. IMPRESSION: No active disease. Electronically Signed   By: Michaeline Blanch M.D.   On: 06/12/2024 15:07    Micro Results     Recent Results (from the past 240 hours)  Culture, blood (routine x 2)     Status: None (Preliminary result)   Collection Time: 06/12/24  7:33 PM   Specimen: BLOOD RIGHT ARM  Result Value Ref Range Status   Specimen Description BLOOD RIGHT ARM  Final   Special Requests   Final    BOTTLES DRAWN AEROBIC AND ANAEROBIC Blood Culture results may not be optimal due to an inadequate volume of blood received in culture bottles   Culture   Final    NO GROWTH < 12 HOURS Performed at [facility], [address], [place], [place] [address]    Report Status PENDING  Incomplete  Culture, blood (routine x 2)     Status: None (Preliminary result)   Collection Time: 06/12/24  8:04 PM   Specimen: BLOOD  Result Value Ref Range Status   Specimen Description BLOOD SITE NOT SPECIFIED  Final   Special Requests   Final    BOTTLES DRAWN AEROBIC AND ANAEROBIC Blood Culture adequate volume   Culture   Final  NO GROWTH < 12 HOURS Performed at [facility], [address] [place], [place] [address]    Report Status PENDING  Incomplete    Today   Subjective    [name] today has no headache,no chest abdominal pain,no new weakness tingling or numbness, feels much better wants to go home today.     Objective   Blood pressure 99/74, pulse 65, temperature 98 F (36.7 C), temperature source Oral, resp. rate 15, height 6' (1.829 m), weight 67 kg, SpO2 100%.   Intake/Output Summary (Last 24 hours) at 06/14/2024 0810 Last data filed at 06/13/2024 1950 Gross per 24 hour  Intake --  Output 0 ml  Net 0  ml    Exam  Awake Alert, No new F.N deficits,    [place].AT,PERRAL Supple Neck,   Symmetrical Chest wall movement, Good air movement bilaterally, CTAB RRR,No Gallops,   +ve B.Sounds, Abd Soft, Non tender,  No Cyanosis, Clubbing or edema    Data Review   Recent Labs  Lab 06/12/24 1451 06/13/24 0811 06/14/24 0424  WBC 2.0* 4.8 5.4  HGB 7.6* 13.6 12.6*  HCT 24.1* 43.5 38.8*  PLT 119* 207 209  MCV 98.8 99.1 96.3  MCH 31.1 31.0 31.3  MCHC 31.5 31.3 32.5  RDW 13.6 13.8 13.5  LYMPHSABS  --   --  1.2  MONOABS  --   --  0.7  EOSABS  --   --  0.1  BASOSABS  --   --  0.0    Recent Labs  Lab 06/12/24 1517 06/12/24 1638 06/12/24 1939 06/13/24 0603 06/14/24 0424  NA  --  139  --  139 138  K  --  3.0*  --  4.5 4.1  CL  --  99  --  101 99  CO2  --  29  --  22 21*  ANIONGAP  --  11  --  16* 18*  GLUCOSE  --  101*  --  77 79  BUN  --  30*  --  38* 62*  CREATININE  --  11.13*  --  13.14* 16.24*  AST  --  17  --   --   --   ALT  --  10  --   --   --   ALKPHOS  --  61  --   --   --   BILITOT  --  0.5  --   --   --   ALBUMIN   --  3.0*  --   --   --   LATICACIDVEN  --   --  1.1  --   --   INR 1.0  --   --   --   --   MG 2.4  --   --   --   --   CALCIUM   --  7.6*  --  7.5* 7.2*    Total Time in preparing paper work, data evaluation and todays exam - 35 minutes  Signature  -    Lavada Stank M.D on 06/14/2024 at 8:10 AM   -  To page go to [email or web address]

## 2024-06-14 NOTE — Evaluation (Signed)
 Physical Therapy Brief Evaluation and Discharge Note Patient Details Name: [name] MRN: [id number] DOB: 1971-08-30 Today's Date: 06/14/2024   History of Present Illness  [name] is a 53 y.o. male presents to the ED via EMS from home for evaluation of chest pain and dizziness. 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  Clinical Impression  Pt presents with admitting diagnosis above. Pt today was able to ambulate in hallway independently. PTA pt was fully independent. Pt presents at or near baseline mobility. Pt has no further acute PT needs and will be signing off. Re consult PT if mobility status changes. Pt would benefit from continued mobility with mobility specialist during acute stay.        PT Assessment Patient does not need any further PT services  Assistance Needed at Discharge  PRN    Equipment Recommendations None recommended by PT  Recommendations for Other Services       Precautions/Restrictions Precautions Precautions: Fall Recall of Precautions/Restrictions: Intact Restrictions Weight Bearing Restrictions Per Provider Order: No        Mobility  Bed Mobility       General bed mobility comments: Seated EOB  Transfers Overall transfer level: Independent Equipment used: None                    Ambulation/Gait Ambulation/Gait assistance: Independent Gait Distance (Feet): 300 Feet Assistive device: None Gait Pattern/deviations: WFL(Within Functional Limits) Gait Speed: Pace WFL General Gait Details: no LOB noted  Home Activity Instructions    Stairs            Modified Rankin (Stroke Patients Only)        Balance Overall balance assessment: Independent                        Pertinent Vitals/Pain PT - Brief Vital Signs All Vital Signs Stable: Yes Pain  Assessment Pain Assessment: No/denies pain     Home Living Family/patient expects to be discharged to:: Private residence Living Arrangements: Non-relatives/Friends Available Help at Discharge: Friend(s);Available 24 hours/day Home Environment: Stairs to enter;Rail - right;Rail - left  Stairs-Number of Steps: 1 Home Equipment: None   Additional Comments: Lives with roommates    Prior Function Level of Independence: Independent      UE/LE Assessment   UE ROM/Strength/Tone/Coordination: WFL    LE ROM/Strength/Tone/Coordination: [facility]      Communication   Communication Communication: No apparent difficulties     Cognition Overall Cognitive Status: Appears within functional limits for tasks assessed/performed       General Comments General comments (skin integrity, edema, etc.): VSS    Exercises     Assessment/Plan    PT Problem List         PT Visit Diagnosis Other abnormalities of gait and mobility (R26.89)    No Skilled PT Patient at baseline level of functioning;Patient is independent with all acitivity/mobility   Co-evaluation                AMPAC 6 Clicks Help needed turning from your back to your side while in a flat bed without using bedrails?: None Help needed moving from lying on your back to sitting on the side of a flat bed without using bedrails?: None Help needed moving to and from a bed to a chair (including a wheelchair)?: None Help needed standing up  from a chair using your arms (e.g., wheelchair or bedside chair)?: None Help needed to walk in hospital room?: None Help needed climbing 3-5 steps with a railing? : None 6 Click Score: 24      End of Session Equipment Utilized During Treatment: Gait belt Activity Tolerance: Patient tolerated treatment well Patient left: in bed;with call bell/phone within reach Nurse Communication: Mobility status PT Visit Diagnosis: Other abnormalities of gait and mobility (R26.89)     Time:  8645-8597 PT Time Calculation (min) (ACUTE ONLY): 8 min  Charges:   PT Evaluation $PT Eval Low Complexity: 1 Low      Presley Gora B, PT, DPT Acute Rehab Services [phone]   [name]  06/14/2024, 2:14 PM

## 2024-06-14 NOTE — Discharge Planning (Signed)
 [facility] Patient Discharge Orders- [facility] CLINIC: [facility]  Patient's name: [name] Admit/DC Dates: 06/12/2024 - 06/14/2024  Discharge Diagnoses: Hypotension - BP meds stopped 2 weeks ago. Now on Midodrine  10mg  TID    Aranesp : Given:  Last Hgb: 12.6 PRBC's Given: No  ESA dose for discharge: N/A IV Iron dose at discharge: N/A  Heparin  change: No  EDW Change: No. EDW was recently raised 64.7kg on 9/10. Leave for now.   Bath Change: No  Access intervention/Change: No  Hectorol /Calcitriol  change: N/A  Discharge Labs: Calcium  7.2  Phosphorus 2.4  Albumin  3.0  K+ 4.1  IV Antibiotics: No  On Coumadin ?: No   OTHER/APPTS/LAB ORDERS:  Okay for patient to have an extra Midodrine  10mg  dose mid-run PRN    D/C Meds to be reconciled by nurse after every discharge.  Completed By: Charmaine Piety, NP   Reviewed by: MD:______ RN_______

## 2024-06-14 NOTE — Plan of Care (Signed)

## 2024-06-14 NOTE — Progress Notes (Signed)
 D/C order noted. Contacted [facility] to be advised of pt's d/c today and that pt should resume care on Monday.   Randine Mungo Dialysis Navigator [phone]

## 2024-06-14 NOTE — Care Management (Signed)
  Transition of Care (TOC) Screening Note   Patient Details  Name: [name] Date of Birth: 04/03/1971   Transition of Care [facility]) CM/SW Contact:    Corean JAYSON Canary, RN Phone Number: 06/14/2024, 12:56 PM    Transition of Care Department [facility]) has reviewed patient and no TOC needs have been identified at this time. We will continue to monitor patient advancement through interdisciplinary progression rounds. If new patient transition needs arise, please place a TOC consult.

## 2024-06-15 LAB — HEPATITIS B SURFACE ANTIBODY, QUANTITATIVE: Hep B S AB Quant (Post): <3.5 m[IU]/mL — ABNORMAL LOW

## 2024-06-16 ENCOUNTER — Telehealth: Payer: Self-pay | Admitting: Nephrology

## 2024-06-16 NOTE — Telephone Encounter (Signed)
 Transition of Care Contact from Inpatient Facility  Date of Discharge: 06/14/24 Date of Contact: 06/16/24 Method of contact: phone - attempted, No answer  Attempted to contact patient to discuss transition of care from inpatient admission.  Patient did not answer the phone.  Will attempt to call them again and if unable to reach will follow up at dialysis.  Manuelita Labella, PA-C [employer]

## 2024-06-17 LAB — CULTURE, BLOOD (ROUTINE X 2)
Culture: NO GROWTH
Culture: NO GROWTH
Special Requests: ADEQUATE

## 2024-06-18 ENCOUNTER — Telehealth: Payer: Self-pay

## 2024-06-18 NOTE — Transitions of Care (Post Inpatient/ED Visit) (Signed)
   06/18/2024  Name: [name] [facility] MRN: [id number] DOB: 22-May-1971  Today's TOC FU Call Status: Today's TOC FU Call Status:: Unsuccessful Call (1st Attempt) Unsuccessful Call (1st Attempt) Date: 06/18/24  Attempted to reach the patient regarding the most recent Inpatient/ED visit.  Follow Up Plan: Additional outreach attempts will be made to reach the patient to complete the Transitions of Care (Post Inpatient/ED visit) call.   Signature  Slater Diesel, RN

## 2024-06-19 ENCOUNTER — Telehealth: Payer: Self-pay

## 2024-06-19 NOTE — Transitions of Care (Post Inpatient/ED Visit) (Signed)
   06/19/2024  Name: [name] MRN: [id number] DOB: Jan 14, 1971  Today's TOC FU Call Status: Today's TOC FU Call Status:: Unsuccessful Call (2nd Attempt) Unsuccessful Call (1st Attempt) Date: 06/18/24 Unsuccessful Call (2nd Attempt) Date: 06/19/24  Attempted to reach the patient regarding the most recent Inpatient/ED visit.  Follow Up Plan: Additional outreach attempts will be made to reach the patient to complete the Transitions of Care (Post Inpatient/ED visit) call.   Signature  Slater Diesel, RN

## 2024-06-20 ENCOUNTER — Telehealth: Payer: Self-pay

## 2024-06-20 NOTE — Transitions of Care (Post Inpatient/ED Visit) (Signed)
   06/20/2024  Name: [name] [facility] MRN: [id number] DOB: 04/08/1971  Today's TOC FU Call Status: Today's TOC FU Call Status:: Unsuccessful Call (3rd Attempt) Unsuccessful Call (1st Attempt) Date: 06/18/24 Unsuccessful Call (2nd Attempt) Date: 06/19/24 Unsuccessful Call (3rd Attempt) Date: 06/20/24  Attempted to reach the patient regarding the most recent Inpatient/ED visit.  Follow Up Plan: No further outreach attempts will be made at this time. We have been unable to contact the patient.  Rosaline Bohr, NP is listed as his PCP but he last saw her 07/2022 to establish care and has not seen her since then.   Letter sent to patient requesting he call to schedule a hospital follow up appointment as we have not been able to reach him.   Signature  Slater Diesel, RN

## 2024-10-18 ENCOUNTER — Other Ambulatory Visit (HOSPITAL_COMMUNITY): Payer: Self-pay
